# Patient Record
Sex: Male | Born: 1945 | Race: White | Hispanic: No | Marital: Married | Smoking: Former smoker
Health system: Southern US, Community
[De-identification: ages and names within clinical notes are randomized; demographics above are authoritative.]

## PROBLEM LIST (undated history)

## (undated) DIAGNOSIS — K589 Irritable bowel syndrome without diarrhea: Secondary | ICD-10-CM

## (undated) DIAGNOSIS — E785 Hyperlipidemia, unspecified: Secondary | ICD-10-CM

## (undated) DIAGNOSIS — I219 Acute myocardial infarction, unspecified: Secondary | ICD-10-CM

## (undated) DIAGNOSIS — Z95810 Presence of automatic (implantable) cardiac defibrillator: Secondary | ICD-10-CM

## (undated) DIAGNOSIS — I10 Essential (primary) hypertension: Secondary | ICD-10-CM

## (undated) DIAGNOSIS — K219 Gastro-esophageal reflux disease without esophagitis: Secondary | ICD-10-CM

## (undated) DIAGNOSIS — R0600 Dyspnea, unspecified: Secondary | ICD-10-CM

## (undated) DIAGNOSIS — I499 Cardiac arrhythmia, unspecified: Secondary | ICD-10-CM

## (undated) DIAGNOSIS — T4145XA Adverse effect of unspecified anesthetic, initial encounter: Secondary | ICD-10-CM

## (undated) DIAGNOSIS — E669 Obesity, unspecified: Secondary | ICD-10-CM

## (undated) DIAGNOSIS — E119 Type 2 diabetes mellitus without complications: Secondary | ICD-10-CM

## (undated) DIAGNOSIS — M545 Low back pain, unspecified: Secondary | ICD-10-CM

## (undated) DIAGNOSIS — G250 Essential tremor: Principal | ICD-10-CM

## (undated) DIAGNOSIS — E291 Testicular hypofunction: Secondary | ICD-10-CM

## (undated) DIAGNOSIS — I4891 Unspecified atrial fibrillation: Principal | ICD-10-CM

## (undated) DIAGNOSIS — Z9889 Other specified postprocedural states: Secondary | ICD-10-CM

## (undated) DIAGNOSIS — N200 Calculus of kidney: Secondary | ICD-10-CM

## (undated) DIAGNOSIS — G2581 Restless legs syndrome: Secondary | ICD-10-CM

## (undated) DIAGNOSIS — M1990 Unspecified osteoarthritis, unspecified site: Secondary | ICD-10-CM

## (undated) DIAGNOSIS — Z8709 Personal history of other diseases of the respiratory system: Secondary | ICD-10-CM

## (undated) DIAGNOSIS — I447 Left bundle-branch block, unspecified: Secondary | ICD-10-CM

## (undated) DIAGNOSIS — F419 Anxiety disorder, unspecified: Secondary | ICD-10-CM

## (undated) DIAGNOSIS — I2510 Atherosclerotic heart disease of native coronary artery without angina pectoris: Secondary | ICD-10-CM

## (undated) DIAGNOSIS — R112 Nausea with vomiting, unspecified: Secondary | ICD-10-CM

## (undated) DIAGNOSIS — G8929 Other chronic pain: Secondary | ICD-10-CM

## (undated) DIAGNOSIS — J61 Pneumoconiosis due to asbestos and other mineral fibers: Secondary | ICD-10-CM

## (undated) DIAGNOSIS — Z973 Presence of spectacles and contact lenses: Secondary | ICD-10-CM

## (undated) DIAGNOSIS — M12811 Other specific arthropathies, not elsewhere classified, right shoulder: Secondary | ICD-10-CM

## (undated) DIAGNOSIS — G4730 Sleep apnea, unspecified: Secondary | ICD-10-CM

## (undated) DIAGNOSIS — Z87442 Personal history of urinary calculi: Secondary | ICD-10-CM

## (undated) DIAGNOSIS — C6290 Malignant neoplasm of unspecified testis, unspecified whether descended or undescended: Secondary | ICD-10-CM

## (undated) DIAGNOSIS — I428 Other cardiomyopathies: Secondary | ICD-10-CM

## (undated) DIAGNOSIS — D6851 Activated protein C resistance: Secondary | ICD-10-CM

## (undated) DIAGNOSIS — M7500 Adhesive capsulitis of unspecified shoulder: Secondary | ICD-10-CM

## (undated) HISTORY — DX: Low back pain, unspecified: M54.50

## (undated) HISTORY — DX: Adhesive capsulitis of unspecified shoulder: M75.00

## (undated) HISTORY — DX: Activated protein C resistance: D68.51

## (undated) HISTORY — PX: KNEE ARTHROSCOPY: SUR90

## (undated) HISTORY — DX: Malignant neoplasm of unspecified testis, unspecified whether descended or undescended: C62.90

## (undated) HISTORY — PX: HERNIA REPAIR: SHX51

## (undated) HISTORY — DX: Low back pain: M54.5

## (undated) HISTORY — DX: Pneumoconiosis due to asbestos and other mineral fibers: J61

## (undated) HISTORY — DX: Gastro-esophageal reflux disease without esophagitis: K21.9

## (undated) HISTORY — PX: OTHER SURGICAL HISTORY: SHX169

## (undated) HISTORY — PX: TONSILLECTOMY: SUR1361

## (undated) HISTORY — DX: Other chronic pain: G89.29

## (undated) HISTORY — PX: APPENDECTOMY: SHX54

## (undated) HISTORY — DX: Testicular hypofunction: E29.1

## (undated) HISTORY — PX: RADIOFREQUENCY ABLATION NERVES: SUR1070

## (undated) HISTORY — DX: Unspecified osteoarthritis, unspecified site: M19.90

## (undated) HISTORY — PX: CHOLECYSTECTOMY OPEN: SUR202

## (undated) HISTORY — DX: Obesity, unspecified: E66.9

## (undated) HISTORY — PX: ROTATOR CUFF REPAIR: SHX139

## (undated) HISTORY — DX: Left bundle-branch block, unspecified: I44.7

## (undated) HISTORY — DX: Essential tremor: G25.0

## (undated) HISTORY — PX: SURGERY SCROTAL / TESTICULAR: SUR1316

---

## 1970-03-19 DIAGNOSIS — C6290 Malignant neoplasm of unspecified testis, unspecified whether descended or undescended: Secondary | ICD-10-CM

## 1970-03-19 HISTORY — DX: Malignant neoplasm of unspecified testis, unspecified whether descended or undescended: C62.90

## 1994-03-19 DIAGNOSIS — T8859XA Other complications of anesthesia, initial encounter: Secondary | ICD-10-CM

## 1994-03-19 HISTORY — DX: Other complications of anesthesia, initial encounter: T88.59XA

## 2009-07-17 HISTORY — PX: TRANSTHORACIC ECHOCARDIOGRAM: SHX275

## 2009-08-17 DIAGNOSIS — I428 Other cardiomyopathies: Secondary | ICD-10-CM

## 2009-08-17 DIAGNOSIS — I2510 Atherosclerotic heart disease of native coronary artery without angina pectoris: Secondary | ICD-10-CM

## 2009-08-17 HISTORY — DX: Atherosclerotic heart disease of native coronary artery without angina pectoris: I25.10

## 2009-08-17 HISTORY — DX: Other cardiomyopathies: I42.8

## 2009-08-17 HISTORY — PX: CARDIAC CATHETERIZATION: SHX172

## 2011-03-28 DIAGNOSIS — Z79899 Other long term (current) drug therapy: Secondary | ICD-10-CM | POA: Diagnosis not present

## 2011-03-28 DIAGNOSIS — M545 Low back pain: Secondary | ICD-10-CM | POA: Diagnosis not present

## 2011-03-28 DIAGNOSIS — I10 Essential (primary) hypertension: Secondary | ICD-10-CM | POA: Diagnosis not present

## 2011-03-28 DIAGNOSIS — N480 Leukoplakia of penis: Secondary | ICD-10-CM | POA: Diagnosis not present

## 2011-04-19 DIAGNOSIS — M47817 Spondylosis without myelopathy or radiculopathy, lumbosacral region: Secondary | ICD-10-CM | POA: Diagnosis not present

## 2011-04-19 DIAGNOSIS — M5137 Other intervertebral disc degeneration, lumbosacral region: Secondary | ICD-10-CM | POA: Diagnosis not present

## 2011-04-23 DIAGNOSIS — I10 Essential (primary) hypertension: Secondary | ICD-10-CM | POA: Diagnosis not present

## 2011-05-01 DIAGNOSIS — I10 Essential (primary) hypertension: Secondary | ICD-10-CM | POA: Diagnosis not present

## 2011-05-09 DIAGNOSIS — I10 Essential (primary) hypertension: Secondary | ICD-10-CM | POA: Diagnosis not present

## 2011-05-09 DIAGNOSIS — G8929 Other chronic pain: Secondary | ICD-10-CM | POA: Diagnosis not present

## 2011-05-09 DIAGNOSIS — E291 Testicular hypofunction: Secondary | ICD-10-CM | POA: Diagnosis not present

## 2011-05-09 DIAGNOSIS — K047 Periapical abscess without sinus: Secondary | ICD-10-CM | POA: Diagnosis not present

## 2011-05-10 DIAGNOSIS — M25559 Pain in unspecified hip: Secondary | ICD-10-CM | POA: Diagnosis not present

## 2011-06-11 DIAGNOSIS — M47817 Spondylosis without myelopathy or radiculopathy, lumbosacral region: Secondary | ICD-10-CM | POA: Diagnosis not present

## 2011-06-18 DIAGNOSIS — E119 Type 2 diabetes mellitus without complications: Secondary | ICD-10-CM

## 2011-06-18 HISTORY — DX: Type 2 diabetes mellitus without complications: E11.9

## 2011-07-10 DIAGNOSIS — I10 Essential (primary) hypertension: Secondary | ICD-10-CM | POA: Diagnosis not present

## 2011-07-10 DIAGNOSIS — E785 Hyperlipidemia, unspecified: Secondary | ICD-10-CM | POA: Diagnosis not present

## 2011-07-10 DIAGNOSIS — J019 Acute sinusitis, unspecified: Secondary | ICD-10-CM | POA: Diagnosis not present

## 2011-07-11 DIAGNOSIS — R7309 Other abnormal glucose: Secondary | ICD-10-CM | POA: Diagnosis not present

## 2011-08-23 DIAGNOSIS — E119 Type 2 diabetes mellitus without complications: Secondary | ICD-10-CM | POA: Diagnosis not present

## 2011-08-23 DIAGNOSIS — H25099 Other age-related incipient cataract, unspecified eye: Secondary | ICD-10-CM | POA: Diagnosis not present

## 2011-08-30 DIAGNOSIS — IMO0002 Reserved for concepts with insufficient information to code with codable children: Secondary | ICD-10-CM | POA: Diagnosis not present

## 2011-08-30 DIAGNOSIS — M25569 Pain in unspecified knee: Secondary | ICD-10-CM | POA: Diagnosis not present

## 2011-09-04 DIAGNOSIS — D235 Other benign neoplasm of skin of trunk: Secondary | ICD-10-CM | POA: Diagnosis not present

## 2011-09-04 DIAGNOSIS — D239 Other benign neoplasm of skin, unspecified: Secondary | ICD-10-CM | POA: Diagnosis not present

## 2011-10-05 DIAGNOSIS — G8929 Other chronic pain: Secondary | ICD-10-CM | POA: Diagnosis not present

## 2011-10-05 DIAGNOSIS — E119 Type 2 diabetes mellitus without complications: Secondary | ICD-10-CM | POA: Diagnosis not present

## 2011-10-05 DIAGNOSIS — G2589 Other specified extrapyramidal and movement disorders: Secondary | ICD-10-CM | POA: Diagnosis not present

## 2011-11-14 DIAGNOSIS — G8929 Other chronic pain: Secondary | ICD-10-CM | POA: Diagnosis not present

## 2011-11-14 DIAGNOSIS — E119 Type 2 diabetes mellitus without complications: Secondary | ICD-10-CM | POA: Diagnosis not present

## 2012-02-04 DIAGNOSIS — G8929 Other chronic pain: Secondary | ICD-10-CM | POA: Diagnosis not present

## 2012-02-04 DIAGNOSIS — I10 Essential (primary) hypertension: Secondary | ICD-10-CM | POA: Diagnosis not present

## 2012-02-04 DIAGNOSIS — E291 Testicular hypofunction: Secondary | ICD-10-CM | POA: Diagnosis not present

## 2012-02-04 DIAGNOSIS — E119 Type 2 diabetes mellitus without complications: Secondary | ICD-10-CM | POA: Diagnosis not present

## 2012-02-04 DIAGNOSIS — E785 Hyperlipidemia, unspecified: Secondary | ICD-10-CM | POA: Diagnosis not present

## 2012-02-11 DIAGNOSIS — Z23 Encounter for immunization: Secondary | ICD-10-CM | POA: Diagnosis not present

## 2012-02-20 DIAGNOSIS — M1990 Unspecified osteoarthritis, unspecified site: Secondary | ICD-10-CM | POA: Diagnosis not present

## 2012-02-20 DIAGNOSIS — Z79899 Other long term (current) drug therapy: Secondary | ICD-10-CM | POA: Diagnosis not present

## 2012-02-20 DIAGNOSIS — G894 Chronic pain syndrome: Secondary | ICD-10-CM | POA: Diagnosis not present

## 2012-02-20 DIAGNOSIS — M47817 Spondylosis without myelopathy or radiculopathy, lumbosacral region: Secondary | ICD-10-CM | POA: Diagnosis not present

## 2012-02-20 DIAGNOSIS — M5137 Other intervertebral disc degeneration, lumbosacral region: Secondary | ICD-10-CM | POA: Diagnosis not present

## 2012-02-27 DIAGNOSIS — M19019 Primary osteoarthritis, unspecified shoulder: Secondary | ICD-10-CM | POA: Diagnosis not present

## 2012-03-03 DIAGNOSIS — M25559 Pain in unspecified hip: Secondary | ICD-10-CM | POA: Diagnosis not present

## 2012-03-05 DIAGNOSIS — M25559 Pain in unspecified hip: Secondary | ICD-10-CM | POA: Diagnosis not present

## 2012-03-20 DIAGNOSIS — IMO0001 Reserved for inherently not codable concepts without codable children: Secondary | ICD-10-CM | POA: Diagnosis not present

## 2012-03-20 DIAGNOSIS — G894 Chronic pain syndrome: Secondary | ICD-10-CM | POA: Diagnosis not present

## 2012-03-20 DIAGNOSIS — G5790 Unspecified mononeuropathy of unspecified lower limb: Secondary | ICD-10-CM | POA: Diagnosis not present

## 2012-03-20 DIAGNOSIS — M1990 Unspecified osteoarthritis, unspecified site: Secondary | ICD-10-CM | POA: Diagnosis not present

## 2012-03-27 DIAGNOSIS — IMO0002 Reserved for concepts with insufficient information to code with codable children: Secondary | ICD-10-CM | POA: Diagnosis not present

## 2012-03-27 DIAGNOSIS — R209 Unspecified disturbances of skin sensation: Secondary | ICD-10-CM | POA: Diagnosis not present

## 2012-05-05 DIAGNOSIS — Z79899 Other long term (current) drug therapy: Secondary | ICD-10-CM | POA: Diagnosis not present

## 2012-05-05 DIAGNOSIS — G894 Chronic pain syndrome: Secondary | ICD-10-CM | POA: Diagnosis not present

## 2012-05-05 DIAGNOSIS — M169 Osteoarthritis of hip, unspecified: Secondary | ICD-10-CM | POA: Diagnosis not present

## 2012-05-05 DIAGNOSIS — M76899 Other specified enthesopathies of unspecified lower limb, excluding foot: Secondary | ICD-10-CM | POA: Diagnosis not present

## 2012-05-07 DIAGNOSIS — G4700 Insomnia, unspecified: Secondary | ICD-10-CM | POA: Diagnosis not present

## 2012-05-07 DIAGNOSIS — E119 Type 2 diabetes mellitus without complications: Secondary | ICD-10-CM | POA: Diagnosis not present

## 2012-05-14 DIAGNOSIS — M76899 Other specified enthesopathies of unspecified lower limb, excluding foot: Secondary | ICD-10-CM | POA: Diagnosis not present

## 2012-05-15 ENCOUNTER — Other Ambulatory Visit: Payer: Self-pay | Admitting: Pain Medicine

## 2012-05-24 ENCOUNTER — Other Ambulatory Visit: Payer: Self-pay

## 2012-06-02 ENCOUNTER — Other Ambulatory Visit: Payer: Self-pay

## 2012-06-02 DIAGNOSIS — M47817 Spondylosis without myelopathy or radiculopathy, lumbosacral region: Secondary | ICD-10-CM | POA: Diagnosis not present

## 2012-06-07 ENCOUNTER — Ambulatory Visit
Admission: RE | Admit: 2012-06-07 | Discharge: 2012-06-07 | Disposition: A | Discharge status: Home / Self Care | Payer: Medicare Other | Source: Ambulatory Visit | Attending: Pain Medicine | Admitting: Pain Medicine

## 2012-06-07 DIAGNOSIS — M47817 Spondylosis without myelopathy or radiculopathy, lumbosacral region: Secondary | ICD-10-CM | POA: Diagnosis not present

## 2012-06-07 DIAGNOSIS — M545 Low back pain: Secondary | ICD-10-CM

## 2012-06-13 ENCOUNTER — Ambulatory Visit (HOSPITAL_COMMUNITY)
Admission: RE | Admit: 2012-06-13 | Discharge: 2012-06-13 | Disposition: A | Discharge status: Home / Self Care | Payer: Medicare Other | Source: Ambulatory Visit | Attending: Cardiology | Admitting: Cardiology

## 2012-07-16 DIAGNOSIS — M67919 Unspecified disorder of synovium and tendon, unspecified shoulder: Secondary | ICD-10-CM | POA: Diagnosis not present

## 2012-08-04 DIAGNOSIS — N529 Male erectile dysfunction, unspecified: Secondary | ICD-10-CM | POA: Diagnosis not present

## 2012-08-04 DIAGNOSIS — I10 Essential (primary) hypertension: Secondary | ICD-10-CM | POA: Diagnosis not present

## 2012-08-04 DIAGNOSIS — E119 Type 2 diabetes mellitus without complications: Secondary | ICD-10-CM | POA: Diagnosis not present

## 2012-08-06 DIAGNOSIS — G894 Chronic pain syndrome: Secondary | ICD-10-CM | POA: Diagnosis not present

## 2012-08-06 DIAGNOSIS — Z79899 Other long term (current) drug therapy: Secondary | ICD-10-CM | POA: Diagnosis not present

## 2012-08-06 DIAGNOSIS — M353 Polymyalgia rheumatica: Secondary | ICD-10-CM | POA: Diagnosis not present

## 2012-08-06 DIAGNOSIS — M5137 Other intervertebral disc degeneration, lumbosacral region: Secondary | ICD-10-CM | POA: Diagnosis not present

## 2012-08-12 DIAGNOSIS — M25559 Pain in unspecified hip: Secondary | ICD-10-CM | POA: Diagnosis not present

## 2012-08-12 DIAGNOSIS — M5380 Other specified dorsopathies, site unspecified: Secondary | ICD-10-CM | POA: Diagnosis not present

## 2012-08-13 DIAGNOSIS — M25559 Pain in unspecified hip: Secondary | ICD-10-CM | POA: Diagnosis not present

## 2012-08-14 DIAGNOSIS — M47817 Spondylosis without myelopathy or radiculopathy, lumbosacral region: Secondary | ICD-10-CM | POA: Diagnosis not present

## 2012-08-26 DIAGNOSIS — M2550 Pain in unspecified joint: Secondary | ICD-10-CM | POA: Diagnosis not present

## 2012-08-26 DIAGNOSIS — G894 Chronic pain syndrome: Secondary | ICD-10-CM | POA: Diagnosis not present

## 2012-08-26 DIAGNOSIS — IMO0001 Reserved for inherently not codable concepts without codable children: Secondary | ICD-10-CM | POA: Diagnosis not present

## 2012-08-26 DIAGNOSIS — Z79899 Other long term (current) drug therapy: Secondary | ICD-10-CM | POA: Diagnosis not present

## 2012-08-26 DIAGNOSIS — M47817 Spondylosis without myelopathy or radiculopathy, lumbosacral region: Secondary | ICD-10-CM | POA: Diagnosis not present

## 2012-11-03 DIAGNOSIS — E119 Type 2 diabetes mellitus without complications: Secondary | ICD-10-CM | POA: Diagnosis not present

## 2012-11-03 DIAGNOSIS — I10 Essential (primary) hypertension: Secondary | ICD-10-CM | POA: Diagnosis not present

## 2012-11-03 DIAGNOSIS — E785 Hyperlipidemia, unspecified: Secondary | ICD-10-CM | POA: Diagnosis not present

## 2012-11-04 DIAGNOSIS — M5137 Other intervertebral disc degeneration, lumbosacral region: Secondary | ICD-10-CM | POA: Diagnosis not present

## 2012-11-04 DIAGNOSIS — Z79899 Other long term (current) drug therapy: Secondary | ICD-10-CM | POA: Diagnosis not present

## 2012-11-04 DIAGNOSIS — IMO0001 Reserved for inherently not codable concepts without codable children: Secondary | ICD-10-CM | POA: Diagnosis not present

## 2012-11-04 DIAGNOSIS — G894 Chronic pain syndrome: Secondary | ICD-10-CM | POA: Diagnosis not present

## 2012-11-19 DIAGNOSIS — G894 Chronic pain syndrome: Secondary | ICD-10-CM | POA: Diagnosis not present

## 2012-11-19 DIAGNOSIS — M5380 Other specified dorsopathies, site unspecified: Secondary | ICD-10-CM | POA: Diagnosis not present

## 2012-11-19 DIAGNOSIS — Z79899 Other long term (current) drug therapy: Secondary | ICD-10-CM | POA: Diagnosis not present

## 2012-11-19 DIAGNOSIS — M5137 Other intervertebral disc degeneration, lumbosacral region: Secondary | ICD-10-CM | POA: Diagnosis not present

## 2012-11-20 DIAGNOSIS — M461 Sacroiliitis, not elsewhere classified: Secondary | ICD-10-CM | POA: Diagnosis not present

## 2012-12-18 DIAGNOSIS — M461 Sacroiliitis, not elsewhere classified: Secondary | ICD-10-CM | POA: Diagnosis not present

## 2012-12-24 DIAGNOSIS — M2550 Pain in unspecified joint: Secondary | ICD-10-CM | POA: Diagnosis not present

## 2012-12-24 DIAGNOSIS — G894 Chronic pain syndrome: Secondary | ICD-10-CM | POA: Diagnosis not present

## 2012-12-24 DIAGNOSIS — M5137 Other intervertebral disc degeneration, lumbosacral region: Secondary | ICD-10-CM | POA: Diagnosis not present

## 2012-12-24 DIAGNOSIS — IMO0001 Reserved for inherently not codable concepts without codable children: Secondary | ICD-10-CM | POA: Diagnosis not present

## 2012-12-24 DIAGNOSIS — Z79899 Other long term (current) drug therapy: Secondary | ICD-10-CM | POA: Diagnosis not present

## 2013-01-07 DIAGNOSIS — Z23 Encounter for immunization: Secondary | ICD-10-CM | POA: Diagnosis not present

## 2013-01-15 DIAGNOSIS — M25559 Pain in unspecified hip: Secondary | ICD-10-CM | POA: Diagnosis not present

## 2013-01-28 DIAGNOSIS — M5380 Other specified dorsopathies, site unspecified: Secondary | ICD-10-CM | POA: Diagnosis not present

## 2013-01-28 DIAGNOSIS — M353 Polymyalgia rheumatica: Secondary | ICD-10-CM | POA: Diagnosis not present

## 2013-01-28 DIAGNOSIS — IMO0001 Reserved for inherently not codable concepts without codable children: Secondary | ICD-10-CM | POA: Diagnosis not present

## 2013-01-28 DIAGNOSIS — M47817 Spondylosis without myelopathy or radiculopathy, lumbosacral region: Secondary | ICD-10-CM | POA: Diagnosis not present

## 2013-01-28 DIAGNOSIS — Z79899 Other long term (current) drug therapy: Secondary | ICD-10-CM | POA: Diagnosis not present

## 2013-01-28 DIAGNOSIS — M5137 Other intervertebral disc degeneration, lumbosacral region: Secondary | ICD-10-CM | POA: Diagnosis not present

## 2013-01-28 DIAGNOSIS — G894 Chronic pain syndrome: Secondary | ICD-10-CM | POA: Diagnosis not present

## 2013-02-05 DIAGNOSIS — E291 Testicular hypofunction: Secondary | ICD-10-CM | POA: Diagnosis not present

## 2013-02-05 DIAGNOSIS — Z1211 Encounter for screening for malignant neoplasm of colon: Secondary | ICD-10-CM | POA: Diagnosis not present

## 2013-02-05 DIAGNOSIS — J61 Pneumoconiosis due to asbestos and other mineral fibers: Secondary | ICD-10-CM | POA: Diagnosis not present

## 2013-02-05 DIAGNOSIS — Z136 Encounter for screening for cardiovascular disorders: Secondary | ICD-10-CM | POA: Diagnosis not present

## 2013-02-05 DIAGNOSIS — Z125 Encounter for screening for malignant neoplasm of prostate: Secondary | ICD-10-CM | POA: Diagnosis not present

## 2013-02-05 DIAGNOSIS — E119 Type 2 diabetes mellitus without complications: Secondary | ICD-10-CM | POA: Diagnosis not present

## 2013-02-05 DIAGNOSIS — I10 Essential (primary) hypertension: Secondary | ICD-10-CM | POA: Diagnosis not present

## 2013-02-05 DIAGNOSIS — E785 Hyperlipidemia, unspecified: Secondary | ICD-10-CM | POA: Diagnosis not present

## 2013-02-05 DIAGNOSIS — Z0000 Encounter for general adult medical examination without abnormal findings: Secondary | ICD-10-CM | POA: Diagnosis not present

## 2013-02-06 ENCOUNTER — Other Ambulatory Visit: Payer: Self-pay | Admitting: Family Medicine

## 2013-02-06 DIAGNOSIS — Z136 Encounter for screening for cardiovascular disorders: Secondary | ICD-10-CM

## 2013-02-11 ENCOUNTER — Ambulatory Visit
Admission: RE | Admit: 2013-02-11 | Discharge: 2013-02-11 | Disposition: A | Discharge status: Home / Self Care | Payer: Medicare Other | Source: Ambulatory Visit | Attending: Family Medicine | Admitting: Family Medicine

## 2013-02-11 ENCOUNTER — Other Ambulatory Visit: Payer: Self-pay | Admitting: Family Medicine

## 2013-02-11 DIAGNOSIS — Z136 Encounter for screening for cardiovascular disorders: Secondary | ICD-10-CM

## 2013-02-11 DIAGNOSIS — R918 Other nonspecific abnormal finding of lung field: Secondary | ICD-10-CM | POA: Diagnosis not present

## 2013-02-16 ENCOUNTER — Other Ambulatory Visit: Payer: Self-pay | Admitting: Family Medicine

## 2013-02-16 DIAGNOSIS — J61 Pneumoconiosis due to asbestos and other mineral fibers: Secondary | ICD-10-CM

## 2013-02-18 ENCOUNTER — Ambulatory Visit
Admission: RE | Admit: 2013-02-18 | Discharge: 2013-02-18 | Disposition: A | Discharge status: Home / Self Care | Payer: Medicare Other | Source: Ambulatory Visit | Attending: Family Medicine | Admitting: Family Medicine

## 2013-02-18 DIAGNOSIS — J61 Pneumoconiosis due to asbestos and other mineral fibers: Secondary | ICD-10-CM

## 2013-02-18 MED ORDER — IOHEXOL 300 MG/ML  SOLN: 75.0000 mL | Freq: Once | INTRAMUSCULAR | Status: AC | PRN

## 2013-02-26 DIAGNOSIS — M47817 Spondylosis without myelopathy or radiculopathy, lumbosacral region: Secondary | ICD-10-CM | POA: Diagnosis not present

## 2013-03-10 DIAGNOSIS — M47817 Spondylosis without myelopathy or radiculopathy, lumbosacral region: Secondary | ICD-10-CM | POA: Diagnosis not present

## 2013-03-30 DIAGNOSIS — E291 Testicular hypofunction: Secondary | ICD-10-CM | POA: Diagnosis not present

## 2013-03-30 DIAGNOSIS — R972 Elevated prostate specific antigen [PSA]: Secondary | ICD-10-CM | POA: Diagnosis not present

## 2013-03-30 DIAGNOSIS — N529 Male erectile dysfunction, unspecified: Secondary | ICD-10-CM | POA: Diagnosis not present

## 2013-03-31 DIAGNOSIS — M5137 Other intervertebral disc degeneration, lumbosacral region: Secondary | ICD-10-CM | POA: Diagnosis not present

## 2013-03-31 DIAGNOSIS — M461 Sacroiliitis, not elsewhere classified: Secondary | ICD-10-CM | POA: Diagnosis not present

## 2013-03-31 DIAGNOSIS — G894 Chronic pain syndrome: Secondary | ICD-10-CM | POA: Diagnosis not present

## 2013-03-31 DIAGNOSIS — Z79899 Other long term (current) drug therapy: Secondary | ICD-10-CM | POA: Diagnosis not present

## 2013-04-16 ENCOUNTER — Ambulatory Visit
Admission: RE | Admit: 2013-04-16 | Discharge: 2013-04-16 | Disposition: A | Discharge status: Home / Self Care | Payer: Medicare Other | Source: Ambulatory Visit | Attending: Family Medicine | Admitting: Family Medicine

## 2013-04-16 DIAGNOSIS — J8410 Pulmonary fibrosis, unspecified: Secondary | ICD-10-CM | POA: Diagnosis not present

## 2013-04-16 MED ORDER — IOHEXOL 300 MG/ML  SOLN
75.0000 mL | Freq: Once | INTRAMUSCULAR | Status: AC | PRN
  Administered 2013-04-16: 75 mL via INTRAVENOUS

## 2013-04-17 DIAGNOSIS — E119 Type 2 diabetes mellitus without complications: Secondary | ICD-10-CM | POA: Diagnosis not present

## 2013-04-17 DIAGNOSIS — H18419 Arcus senilis, unspecified eye: Secondary | ICD-10-CM | POA: Diagnosis not present

## 2013-04-20 DIAGNOSIS — D126 Benign neoplasm of colon, unspecified: Secondary | ICD-10-CM | POA: Diagnosis not present

## 2013-04-20 DIAGNOSIS — K5730 Diverticulosis of large intestine without perforation or abscess without bleeding: Secondary | ICD-10-CM | POA: Diagnosis not present

## 2013-04-20 DIAGNOSIS — Z09 Encounter for follow-up examination after completed treatment for conditions other than malignant neoplasm: Secondary | ICD-10-CM | POA: Diagnosis not present

## 2013-04-20 DIAGNOSIS — Z86010 Personal history of colonic polyps: Secondary | ICD-10-CM | POA: Diagnosis not present

## 2013-04-21 ENCOUNTER — Other Ambulatory Visit: Payer: Self-pay | Admitting: Family Medicine

## 2013-04-21 DIAGNOSIS — E279 Disorder of adrenal gland, unspecified: Principal | ICD-10-CM

## 2013-04-21 DIAGNOSIS — E278 Other specified disorders of adrenal gland: Secondary | ICD-10-CM

## 2013-04-29 DIAGNOSIS — M47817 Spondylosis without myelopathy or radiculopathy, lumbosacral region: Secondary | ICD-10-CM | POA: Diagnosis not present

## 2013-04-29 DIAGNOSIS — G894 Chronic pain syndrome: Secondary | ICD-10-CM | POA: Diagnosis not present

## 2013-04-29 DIAGNOSIS — Z79899 Other long term (current) drug therapy: Secondary | ICD-10-CM | POA: Diagnosis not present

## 2013-04-29 DIAGNOSIS — M779 Enthesopathy, unspecified: Secondary | ICD-10-CM | POA: Diagnosis not present

## 2013-04-30 DIAGNOSIS — R002 Palpitations: Secondary | ICD-10-CM | POA: Diagnosis not present

## 2013-04-30 DIAGNOSIS — E119 Type 2 diabetes mellitus without complications: Secondary | ICD-10-CM | POA: Diagnosis not present

## 2013-04-30 DIAGNOSIS — G4700 Insomnia, unspecified: Secondary | ICD-10-CM | POA: Diagnosis not present

## 2013-04-30 DIAGNOSIS — G2589 Other specified extrapyramidal and movement disorders: Secondary | ICD-10-CM | POA: Diagnosis not present

## 2013-04-30 DIAGNOSIS — E279 Disorder of adrenal gland, unspecified: Secondary | ICD-10-CM | POA: Diagnosis not present

## 2013-04-30 DIAGNOSIS — Z8679 Personal history of other diseases of the circulatory system: Secondary | ICD-10-CM | POA: Diagnosis not present

## 2013-04-30 DIAGNOSIS — I10 Essential (primary) hypertension: Secondary | ICD-10-CM | POA: Diagnosis not present

## 2013-04-30 DIAGNOSIS — Z79899 Other long term (current) drug therapy: Secondary | ICD-10-CM | POA: Diagnosis not present

## 2013-04-30 DIAGNOSIS — E291 Testicular hypofunction: Secondary | ICD-10-CM | POA: Diagnosis not present

## 2013-05-07 ENCOUNTER — Other Ambulatory Visit: Payer: Medicare Other

## 2013-05-12 ENCOUNTER — Encounter: Payer: Self-pay | Admitting: General Surgery

## 2013-05-12 DIAGNOSIS — Z8679 Personal history of other diseases of the circulatory system: Secondary | ICD-10-CM

## 2013-05-12 DIAGNOSIS — I2510 Atherosclerotic heart disease of native coronary artery without angina pectoris: Secondary | ICD-10-CM | POA: Insufficient documentation

## 2013-05-12 DIAGNOSIS — I10 Essential (primary) hypertension: Secondary | ICD-10-CM | POA: Insufficient documentation

## 2013-05-12 DIAGNOSIS — G2581 Restless legs syndrome: Secondary | ICD-10-CM | POA: Insufficient documentation

## 2013-05-12 DIAGNOSIS — R002 Palpitations: Secondary | ICD-10-CM

## 2013-05-12 DIAGNOSIS — Z79899 Other long term (current) drug therapy: Secondary | ICD-10-CM | POA: Insufficient documentation

## 2013-05-12 DIAGNOSIS — G4700 Insomnia, unspecified: Secondary | ICD-10-CM

## 2013-05-13 ENCOUNTER — Ambulatory Visit: Payer: Medicare Other | Admitting: Cardiology

## 2013-05-14 ENCOUNTER — Other Ambulatory Visit: Payer: Medicare Other

## 2013-05-17 HISTORY — PX: TRANSTHORACIC ECHOCARDIOGRAM: SHX275

## 2013-05-18 ENCOUNTER — Encounter: Payer: Self-pay | Admitting: *Deleted

## 2013-05-18 ENCOUNTER — Encounter: Payer: Self-pay | Admitting: Cardiology

## 2013-05-18 ENCOUNTER — Ambulatory Visit (INDEPENDENT_AMBULATORY_CARE_PROVIDER_SITE_OTHER): Payer: Medicare Other | Admitting: Cardiology

## 2013-05-18 VITALS — BP 138/80 | HR 84 | Ht 72.0 in | Wt 253.0 lb

## 2013-05-18 DIAGNOSIS — R002 Palpitations: Secondary | ICD-10-CM | POA: Diagnosis not present

## 2013-05-18 DIAGNOSIS — I447 Left bundle-branch block, unspecified: Secondary | ICD-10-CM | POA: Diagnosis not present

## 2013-05-18 DIAGNOSIS — Z8679 Personal history of other diseases of the circulatory system: Secondary | ICD-10-CM

## 2013-05-18 DIAGNOSIS — I10 Essential (primary) hypertension: Secondary | ICD-10-CM | POA: Diagnosis not present

## 2013-05-18 DIAGNOSIS — I428 Other cardiomyopathies: Secondary | ICD-10-CM

## 2013-05-18 NOTE — Progress Notes (Signed)
Patient ID: Walter [name], male   DOB: 03-15-1946, 68 y.o.   MRN: [id number] PCP: Dr. Orland Mustard  68 yo with history of chronic LBBB and mild cardiomyopathy presents for cardiology evaluation.  He was found to have LBBB 15-20 years ago.  In 2011, he had a stress test in [facility] that was abnormal so he was taken for [facility] in 6/11 that showed nonobstructive disease.  He had an echo in 5/11 that showed EF 45-50%, diffuse hypokinesis suggesting a mild nonischemic cardiomyopathy.  He has not had cardiac evaluation since that time.    Recently, patient has been symptomatically stable.  ECG continues to show LBBB.  He has had no chest pain.  He has mild chronic dyspnea that he attributes to asbestosis.  He is short of breath if he rushes up steps or walks up a steep hill.  No orthopnea or bendopnea.  He is currently on lovastatin, which he tolerates.  He had myalgias with Zocor and Lipitor.  He has not been as active as usual this winter and his weight is up.  BP is ok and blood glucose has been controlled.   ECG: NSR, LBBB, PVC  Labs (11/14): K 3.9, creatinine 1.14, LDL 104, LFTs normal  Labs (2/15): HCT 56.3  PMH: 1. Type II diabetes 2. HTN 3. Adrenal nodule 4. Restless leg syndrome 5. OA with left hip pain, right frozen shoulder, low back pain.  6. Testicular cancer: 1972 7. Chronic LBBB: Had Cardiolite in 2011 that was abnormal so had LHC in 6/11 in [place], [place].  This showed 30% LCx stenosis and 40% ostial RCA stenosis.   8. Cardiomyopathy: Mild nonischemic cardiomyopathy, ?LBBB cardiomyopathy.  Echo (5/11) with EF 45-50%, moderate LVH.  9. Factor V Leiden + 10. Asbestosis: Mild.  Exposure while in the [employer].  11. GERD 12. PVCs 13. Polycythemia: Uncertain etiology 14. Hyperlipidemia: Myalgias with Zocor and Lipitor.   SH: Retired from [employer], lives with wife in [place].  2 daughters.  Occasional ETOH.  Quit smoking > 40 years ago.   FH: No premature CAD  ROS: All systems  reviewed and negative except as per HPI.    Current Outpatient Prescriptions  Medication Sig Dispense Refill  . albuterol (PROVENTIL HFA;VENTOLIN HFA) 108 (90 BASE) MCG/ACT inhaler Inhale 2 puffs into the lungs as needed for wheezing or shortness of breath.      Marland Kitchen aspirin 81 MG tablet Take 81 mg by mouth daily.      . celecoxib (CELEBREX) 200 MG capsule Take 200 mg by mouth daily.      . Cholecalciferol (VITAMIN D-3) 1000 UNITS CAPS Take 1 capsule by mouth daily.      Marland Kitchen esomeprazole (NEXIUM) 40 MG capsule Take 40 mg by mouth daily at 12 noon.      . hydrochlorothiazide (HYDRODIURIL) 25 MG tablet Take 25 mg by mouth daily.      Marland Kitchen HYDROcodone-acetaminophen (LORCET) 10-650 MG per tablet Take 1 tablet by mouth as needed for pain (Twice a day as needed for pain).      Marland Kitchen lisinopril (PRINIVIL,ZESTRIL) 40 MG tablet Take 40 mg by mouth daily.      Marland Kitchen LORazepam (ATIVAN) 1 MG tablet Take 1 mg by mouth. As needed 30 mins prior to procedure      . lovastatin (ALTOPREV) 60 MG 24 hr tablet Take 60 mg by mouth at bedtime.      . magnesium oxide (MAG-OX) 400 MG tablet Take 400 mg by mouth  daily.      . metFORMIN (GLUCOPHAGE) 1000 MG tablet Take 1,000 mg by mouth 2 (two) times daily with a meal.      . omega-3 acid ethyl esters (LOVAZA) 1 G capsule Take 1 g by mouth 2 (two) times daily.      Marland Kitchen rOPINIRole (REQUIP) 0.5 MG tablet Take 0.5 mg by mouth daily as needed.      Marland Kitchen rOPINIRole (REQUIP) 1 MG tablet Take 1 mg by mouth as needed.      . sildenafil (VIAGRA) 100 MG tablet Take 100 mg by mouth daily as needed for erectile dysfunction.      . temazepam (RESTORIL) 15 MG capsule Take 15 mg by mouth at bedtime as needed for sleep.      Marland Kitchen testosterone cypionate (DEPO-TESTOSTERONE) 200 MG/ML injection Inject into the muscle every 7 (seven) days.      Marland Kitchen tiZANidine (ZANAFLEX) 4 MG capsule Take 4 mg by mouth as needed for muscle spasms.       No current facility-administered medications for this visit.   BP 138/80   Pulse 84  Ht 6' (1.829 m)  Wt 114.76 kg (253 lb)  BMI 34.31 kg/m2  SpO2 99% General: NAD, overweight Neck: No JVD, no thyromegaly or thyroid nodule.  Lungs: Clear to auscultation bilaterally with normal respiratory effort. CV: Nondisplaced PMI.  Heart regular S1/S2, no S3/S4, no murmur.  No peripheral edema.  No carotid bruit.  Normal pedal pulses.  Abdomen: Soft, nontender, no hepatosplenomegaly, no distention.  Skin: Intact without lesions or rashes.  Neurologic: Alert and oriented x 3.  Psych: Normal affect. Extremities: No clubbing or cyanosis.  HEENT: Normal.   Assessment/Plan: 1. Cardiomyopathy: Mild nonischemic cardiomyopathy based on echo and LHC in 2011.  The cardiomyopathy may be secondary to LBBB (versus HTN).  BP is controlled.  - Continue lisinopril. - Will repeat echo.  If EF is still decreased, would consider starting him on Coreg.  2. CAD: Nonobstructive on LHC in 2011.  No ischemic symptoms.  With CAD and diabetes, he needs to be on statin and ASA 81.  3. Hyperlipidemia: Goal LDL < 70.  He is on the maximum dose of his lovastatin and has not tolerated other statins.  Although LDL is a bit above goal, I would have him continue the current lovastatin for now.  4. HTN: BP is controlled on current regimen.   Loralie Champagne 05/18/2013 5:05 PM

## 2013-05-18 NOTE — Patient Instructions (Signed)
Your physician has requested that you have an echocardiogram. Echocardiography is a painless test that uses sound waves to create images of your heart. It provides your doctor with information about the size and shape of your heart and how well your heart's chambers and valves are working. This procedure takes approximately one hour. There are no restrictions for this procedure.  Your physician wants you to follow-up in: 1 year with Dr McLean. (March 2016). You will receive a reminder letter in the mail two months in advance. If you don't receive a letter, please call our office to schedule the follow-up appointment.   

## 2013-05-22 ENCOUNTER — Ambulatory Visit
Admission: RE | Admit: 2013-05-22 | Discharge: 2013-05-22 | Disposition: A | Discharge status: Home / Self Care | Payer: Medicare Other | Source: Ambulatory Visit | Attending: Family Medicine | Admitting: Family Medicine

## 2013-05-22 DIAGNOSIS — E278 Other specified disorders of adrenal gland: Secondary | ICD-10-CM

## 2013-05-22 DIAGNOSIS — E279 Disorder of adrenal gland, unspecified: Principal | ICD-10-CM

## 2013-06-03 ENCOUNTER — Encounter: Payer: Self-pay | Admitting: Cardiovascular Disease

## 2013-06-03 ENCOUNTER — Ambulatory Visit (HOSPITAL_COMMUNITY): Payer: Medicare Other | Attending: Cardiovascular Disease | Admitting: Cardiology

## 2013-06-03 DIAGNOSIS — E785 Hyperlipidemia, unspecified: Secondary | ICD-10-CM | POA: Insufficient documentation

## 2013-06-03 DIAGNOSIS — I2510 Atherosclerotic heart disease of native coronary artery without angina pectoris: Secondary | ICD-10-CM | POA: Insufficient documentation

## 2013-06-03 DIAGNOSIS — I10 Essential (primary) hypertension: Secondary | ICD-10-CM | POA: Insufficient documentation

## 2013-06-03 DIAGNOSIS — Z87891 Personal history of nicotine dependence: Secondary | ICD-10-CM | POA: Insufficient documentation

## 2013-06-03 DIAGNOSIS — E119 Type 2 diabetes mellitus without complications: Secondary | ICD-10-CM | POA: Insufficient documentation

## 2013-06-03 DIAGNOSIS — I447 Left bundle-branch block, unspecified: Secondary | ICD-10-CM | POA: Insufficient documentation

## 2013-06-03 DIAGNOSIS — I428 Other cardiomyopathies: Secondary | ICD-10-CM | POA: Insufficient documentation

## 2013-06-03 NOTE — Progress Notes (Signed)
Echo performed. 

## 2013-06-04 ENCOUNTER — Ambulatory Visit
Admission: RE | Admit: 2013-06-04 | Discharge: 2013-06-04 | Disposition: A | Discharge status: Home / Self Care | Payer: Medicare Other | Source: Ambulatory Visit | Attending: Family Medicine | Admitting: Family Medicine

## 2013-06-04 DIAGNOSIS — D3500 Benign neoplasm of unspecified adrenal gland: Secondary | ICD-10-CM | POA: Diagnosis not present

## 2013-06-04 MED ORDER — GADOBENATE DIMEGLUMINE 529 MG/ML IV SOLN
20.0000 mL | Freq: Once | INTRAVENOUS | Status: AC | PRN
  Administered 2013-06-04: 20 mL via INTRAVENOUS

## 2013-07-08 DIAGNOSIS — M47817 Spondylosis without myelopathy or radiculopathy, lumbosacral region: Secondary | ICD-10-CM | POA: Diagnosis not present

## 2013-07-08 DIAGNOSIS — M5137 Other intervertebral disc degeneration, lumbosacral region: Secondary | ICD-10-CM | POA: Diagnosis not present

## 2013-07-08 DIAGNOSIS — M19019 Primary osteoarthritis, unspecified shoulder: Secondary | ICD-10-CM | POA: Diagnosis not present

## 2013-07-08 DIAGNOSIS — G894 Chronic pain syndrome: Secondary | ICD-10-CM | POA: Diagnosis not present

## 2013-07-08 DIAGNOSIS — IMO0001 Reserved for inherently not codable concepts without codable children: Secondary | ICD-10-CM | POA: Diagnosis not present

## 2013-07-08 DIAGNOSIS — Z79899 Other long term (current) drug therapy: Secondary | ICD-10-CM | POA: Diagnosis not present

## 2013-07-27 DIAGNOSIS — G2589 Other specified extrapyramidal and movement disorders: Secondary | ICD-10-CM | POA: Diagnosis not present

## 2013-07-27 DIAGNOSIS — E119 Type 2 diabetes mellitus without complications: Secondary | ICD-10-CM | POA: Diagnosis not present

## 2013-07-27 DIAGNOSIS — I10 Essential (primary) hypertension: Secondary | ICD-10-CM | POA: Diagnosis not present

## 2013-07-27 DIAGNOSIS — E785 Hyperlipidemia, unspecified: Secondary | ICD-10-CM | POA: Diagnosis not present

## 2013-07-27 DIAGNOSIS — G4700 Insomnia, unspecified: Secondary | ICD-10-CM | POA: Diagnosis not present

## 2013-07-27 DIAGNOSIS — E291 Testicular hypofunction: Secondary | ICD-10-CM | POA: Diagnosis not present

## 2013-08-12 DIAGNOSIS — M25559 Pain in unspecified hip: Secondary | ICD-10-CM | POA: Diagnosis not present

## 2013-08-27 DIAGNOSIS — M47817 Spondylosis without myelopathy or radiculopathy, lumbosacral region: Secondary | ICD-10-CM | POA: Diagnosis not present

## 2013-09-10 DIAGNOSIS — M47817 Spondylosis without myelopathy or radiculopathy, lumbosacral region: Secondary | ICD-10-CM | POA: Diagnosis not present

## 2013-09-16 DIAGNOSIS — M19019 Primary osteoarthritis, unspecified shoulder: Secondary | ICD-10-CM | POA: Diagnosis not present

## 2013-09-16 DIAGNOSIS — G894 Chronic pain syndrome: Secondary | ICD-10-CM | POA: Diagnosis not present

## 2013-09-16 DIAGNOSIS — Z79899 Other long term (current) drug therapy: Secondary | ICD-10-CM | POA: Diagnosis not present

## 2013-09-16 DIAGNOSIS — M47817 Spondylosis without myelopathy or radiculopathy, lumbosacral region: Secondary | ICD-10-CM | POA: Diagnosis not present

## 2013-10-04 DIAGNOSIS — R300 Dysuria: Secondary | ICD-10-CM | POA: Diagnosis not present

## 2013-10-04 DIAGNOSIS — I2510 Atherosclerotic heart disease of native coronary artery without angina pectoris: Secondary | ICD-10-CM | POA: Diagnosis not present

## 2013-10-04 DIAGNOSIS — K5732 Diverticulitis of large intestine without perforation or abscess without bleeding: Secondary | ICD-10-CM | POA: Diagnosis not present

## 2013-10-04 DIAGNOSIS — R110 Nausea: Secondary | ICD-10-CM | POA: Diagnosis not present

## 2013-10-04 DIAGNOSIS — R1084 Generalized abdominal pain: Secondary | ICD-10-CM | POA: Diagnosis not present

## 2013-10-04 DIAGNOSIS — Z79899 Other long term (current) drug therapy: Secondary | ICD-10-CM | POA: Diagnosis not present

## 2013-10-04 DIAGNOSIS — K5730 Diverticulosis of large intestine without perforation or abscess without bleeding: Secondary | ICD-10-CM | POA: Diagnosis not present

## 2013-10-04 DIAGNOSIS — E1169 Type 2 diabetes mellitus with other specified complication: Secondary | ICD-10-CM | POA: Diagnosis not present

## 2013-10-04 DIAGNOSIS — I10 Essential (primary) hypertension: Secondary | ICD-10-CM | POA: Diagnosis not present

## 2013-10-04 DIAGNOSIS — R197 Diarrhea, unspecified: Secondary | ICD-10-CM | POA: Diagnosis not present

## 2013-10-04 DIAGNOSIS — R109 Unspecified abdominal pain: Secondary | ICD-10-CM | POA: Diagnosis not present

## 2013-10-09 ENCOUNTER — Emergency Department (HOSPITAL_COMMUNITY): Payer: Medicare Other

## 2013-10-09 ENCOUNTER — Inpatient Hospital Stay (HOSPITAL_COMMUNITY)
Admission: EM | Admit: 2013-10-09 | Discharge: 2013-10-19 | DRG: 286 | Disposition: A | Discharge status: Home / Self Care | Payer: Medicare Other | Attending: Cardiology | Admitting: Cardiology

## 2013-10-09 ENCOUNTER — Encounter (HOSPITAL_COMMUNITY): Payer: Self-pay | Admitting: Emergency Medicine

## 2013-10-09 DIAGNOSIS — R339 Retention of urine, unspecified: Secondary | ICD-10-CM | POA: Diagnosis not present

## 2013-10-09 DIAGNOSIS — I495 Sick sinus syndrome: Secondary | ICD-10-CM | POA: Diagnosis present

## 2013-10-09 DIAGNOSIS — I447 Left bundle-branch block, unspecified: Secondary | ICD-10-CM | POA: Diagnosis present

## 2013-10-09 DIAGNOSIS — E785 Hyperlipidemia, unspecified: Secondary | ICD-10-CM | POA: Diagnosis present

## 2013-10-09 DIAGNOSIS — I252 Old myocardial infarction: Secondary | ICD-10-CM

## 2013-10-09 DIAGNOSIS — I509 Heart failure, unspecified: Secondary | ICD-10-CM | POA: Diagnosis present

## 2013-10-09 DIAGNOSIS — R8271 Bacteriuria: Secondary | ICD-10-CM

## 2013-10-09 DIAGNOSIS — I498 Other specified cardiac arrhythmias: Secondary | ICD-10-CM | POA: Diagnosis not present

## 2013-10-09 DIAGNOSIS — Z7901 Long term (current) use of anticoagulants: Secondary | ICD-10-CM

## 2013-10-09 DIAGNOSIS — I2510 Atherosclerotic heart disease of native coronary artery without angina pectoris: Secondary | ICD-10-CM | POA: Diagnosis present

## 2013-10-09 DIAGNOSIS — M545 Low back pain, unspecified: Secondary | ICD-10-CM | POA: Diagnosis not present

## 2013-10-09 DIAGNOSIS — Z794 Long term (current) use of insulin: Secondary | ICD-10-CM

## 2013-10-09 DIAGNOSIS — E669 Obesity, unspecified: Secondary | ICD-10-CM | POA: Diagnosis present

## 2013-10-09 DIAGNOSIS — R9430 Abnormal result of cardiovascular function study, unspecified: Secondary | ICD-10-CM

## 2013-10-09 DIAGNOSIS — I2584 Coronary atherosclerosis due to calcified coronary lesion: Secondary | ICD-10-CM | POA: Diagnosis present

## 2013-10-09 DIAGNOSIS — Z79899 Other long term (current) drug therapy: Secondary | ICD-10-CM

## 2013-10-09 DIAGNOSIS — Z6833 Body mass index (BMI) 33.0-33.9, adult: Secondary | ICD-10-CM

## 2013-10-09 DIAGNOSIS — E119 Type 2 diabetes mellitus without complications: Secondary | ICD-10-CM | POA: Diagnosis present

## 2013-10-09 DIAGNOSIS — K219 Gastro-esophageal reflux disease without esophagitis: Secondary | ICD-10-CM | POA: Diagnosis present

## 2013-10-09 DIAGNOSIS — G2581 Restless legs syndrome: Secondary | ICD-10-CM | POA: Diagnosis present

## 2013-10-09 DIAGNOSIS — I959 Hypotension, unspecified: Secondary | ICD-10-CM | POA: Diagnosis present

## 2013-10-09 DIAGNOSIS — K224 Dyskinesia of esophagus: Secondary | ICD-10-CM | POA: Diagnosis present

## 2013-10-09 DIAGNOSIS — Z87891 Personal history of nicotine dependence: Secondary | ICD-10-CM

## 2013-10-09 DIAGNOSIS — D6859 Other primary thrombophilia: Secondary | ICD-10-CM | POA: Diagnosis present

## 2013-10-09 DIAGNOSIS — I5023 Acute on chronic systolic (congestive) heart failure: Secondary | ICD-10-CM | POA: Diagnosis present

## 2013-10-09 DIAGNOSIS — I10 Essential (primary) hypertension: Secondary | ICD-10-CM | POA: Diagnosis present

## 2013-10-09 DIAGNOSIS — I2119 ST elevation (STEMI) myocardial infarction involving other coronary artery of inferior wall: Secondary | ICD-10-CM | POA: Diagnosis not present

## 2013-10-09 DIAGNOSIS — R0609 Other forms of dyspnea: Secondary | ICD-10-CM | POA: Diagnosis not present

## 2013-10-09 DIAGNOSIS — I428 Other cardiomyopathies: Secondary | ICD-10-CM | POA: Diagnosis present

## 2013-10-09 DIAGNOSIS — R109 Unspecified abdominal pain: Secondary | ICD-10-CM | POA: Diagnosis not present

## 2013-10-09 DIAGNOSIS — R300 Dysuria: Secondary | ICD-10-CM

## 2013-10-09 DIAGNOSIS — I5021 Acute systolic (congestive) heart failure: Secondary | ICD-10-CM | POA: Diagnosis not present

## 2013-10-09 DIAGNOSIS — J61 Pneumoconiosis due to asbestos and other mineral fibers: Secondary | ICD-10-CM | POA: Diagnosis present

## 2013-10-09 DIAGNOSIS — I4891 Unspecified atrial fibrillation: Secondary | ICD-10-CM

## 2013-10-09 DIAGNOSIS — Z8547 Personal history of malignant neoplasm of testis: Secondary | ICD-10-CM

## 2013-10-09 DIAGNOSIS — Z8249 Family history of ischemic heart disease and other diseases of the circulatory system: Secondary | ICD-10-CM

## 2013-10-09 DIAGNOSIS — IMO0001 Reserved for inherently not codable concepts without codable children: Secondary | ICD-10-CM

## 2013-10-09 DIAGNOSIS — I517 Cardiomegaly: Secondary | ICD-10-CM | POA: Diagnosis not present

## 2013-10-09 DIAGNOSIS — R002 Palpitations: Secondary | ICD-10-CM | POA: Diagnosis not present

## 2013-10-09 DIAGNOSIS — R1310 Dysphagia, unspecified: Secondary | ICD-10-CM | POA: Diagnosis not present

## 2013-10-09 DIAGNOSIS — Z7982 Long term (current) use of aspirin: Secondary | ICD-10-CM | POA: Diagnosis not present

## 2013-10-09 DIAGNOSIS — I4819 Other persistent atrial fibrillation: Secondary | ICD-10-CM | POA: Diagnosis present

## 2013-10-09 DIAGNOSIS — K2100 Gastro-esophageal reflux disease with esophagitis, without bleeding: Secondary | ICD-10-CM | POA: Diagnosis not present

## 2013-10-09 DIAGNOSIS — R0989 Other specified symptoms and signs involving the circulatory and respiratory systems: Secondary | ICD-10-CM | POA: Diagnosis not present

## 2013-10-09 DIAGNOSIS — R0602 Shortness of breath: Secondary | ICD-10-CM | POA: Diagnosis not present

## 2013-10-09 HISTORY — DX: Other cardiomyopathies: I42.8

## 2013-10-09 HISTORY — DX: Unspecified atrial fibrillation: I48.91

## 2013-10-09 HISTORY — DX: Type 2 diabetes mellitus without complications: E11.9

## 2013-10-09 HISTORY — DX: Hyperlipidemia, unspecified: E78.5

## 2013-10-09 HISTORY — DX: Essential (primary) hypertension: I10

## 2013-10-09 HISTORY — DX: Atherosclerotic heart disease of native coronary artery without angina pectoris: I25.10

## 2013-10-09 LAB — CBC WITH DIFFERENTIAL/PLATELET
Basophils Absolute: 0 10*3/uL (ref 0.0–0.1)
Basophils Relative: 0 % (ref 0–1)
EOS ABS: 0.1 10*3/uL (ref 0.0–0.7)
EOS PCT: 1 % (ref 0–5)
HEMATOCRIT: 55.1 % — AB (ref 39.0–52.0)
HEMOGLOBIN: 18.5 g/dL — AB (ref 13.0–17.0)
LYMPHS ABS: 2.1 10*3/uL (ref 0.7–4.0)
LYMPHS PCT: 21 % (ref 12–46)
MCH: 33.1 pg (ref 26.0–34.0)
MCHC: 33.6 g/dL (ref 30.0–36.0)
MCV: 98.6 fL (ref 78.0–100.0)
MONOS PCT: 8 % (ref 3–12)
Monocytes Absolute: 0.8 10*3/uL (ref 0.1–1.0)
Neutro Abs: 7 10*3/uL (ref 1.7–7.7)
Neutrophils Relative %: 70 % (ref 43–77)
Platelets: 265 10*3/uL (ref 150–400)
RBC: 5.59 MIL/uL (ref 4.22–5.81)
RDW: 14.9 % (ref 11.5–15.5)
WBC: 10 10*3/uL (ref 4.0–10.5)

## 2013-10-09 LAB — URINE MICROSCOPIC-ADD ON

## 2013-10-09 LAB — URINALYSIS, ROUTINE W REFLEX MICROSCOPIC
Glucose, UA: NEGATIVE mg/dL
Hgb urine dipstick: NEGATIVE
Ketones, ur: 15 mg/dL — AB
Nitrite: NEGATIVE
PROTEIN: NEGATIVE mg/dL
Specific Gravity, Urine: 1.024 (ref 1.005–1.030)
Urobilinogen, UA: 2 mg/dL — ABNORMAL HIGH (ref 0.0–1.0)
pH: 6 (ref 5.0–8.0)

## 2013-10-09 LAB — TROPONIN I

## 2013-10-09 LAB — PROTIME-INR
INR: 1.12 (ref 0.00–1.49)
Prothrombin Time: 14.4 seconds (ref 11.6–15.2)

## 2013-10-09 LAB — COMPREHENSIVE METABOLIC PANEL
ALT: 65 U/L — AB (ref 0–53)
AST: 34 U/L (ref 0–37)
Albumin: 3.9 g/dL (ref 3.5–5.2)
Alkaline Phosphatase: 51 U/L (ref 39–117)
Anion gap: 15 (ref 5–15)
BUN: 25 mg/dL — AB (ref 6–23)
CALCIUM: 8.9 mg/dL (ref 8.4–10.5)
CO2: 29 mEq/L (ref 19–32)
Chloride: 98 mEq/L (ref 96–112)
Creatinine, Ser: 1.05 mg/dL (ref 0.50–1.35)
GFR calc non Af Amer: 70 mL/min — ABNORMAL LOW (ref >90)
GFR, EST AFRICAN AMERICAN: 82 mL/min — AB (ref >90)
GLUCOSE: 124 mg/dL — AB (ref 70–99)
Potassium: 4.5 mEq/L (ref 3.7–5.3)
Sodium: 142 mEq/L (ref 137–147)
Total Bilirubin: 1.2 mg/dL (ref 0.3–1.2)
Total Protein: 6.4 g/dL (ref 6.0–8.3)

## 2013-10-09 LAB — TSH: TSH: 1.42 u[IU]/mL (ref 0.350–4.500)

## 2013-10-09 LAB — PRO B NATRIURETIC PEPTIDE: PRO B NATRI PEPTIDE: 1484 pg/mL — AB (ref 0–125)

## 2013-10-09 LAB — GLUCOSE, CAPILLARY: GLUCOSE-CAPILLARY: 162 mg/dL — AB (ref 70–99)

## 2013-10-09 LAB — HEPARIN LEVEL (UNFRACTIONATED): Heparin Unfractionated: 0.22 IU/mL — ABNORMAL LOW (ref 0.30–0.70)

## 2013-10-09 LAB — APTT: aPTT: 99 seconds — ABNORMAL HIGH (ref 24–37)

## 2013-10-09 MED ORDER — HEPARIN (PORCINE) IN NACL 100-0.45 UNIT/ML-% IJ SOLN
2300.0000 [IU]/h | INTRAMUSCULAR | Status: DC
Start: 2013-10-09 — End: 2013-10-12
  Administered 2013-10-09: 1700 [IU]/h via INTRAVENOUS
  Administered 2013-10-09: 1500 [IU]/h via INTRAVENOUS
  Administered 2013-10-10: 1900 [IU]/h via INTRAVENOUS
  Administered 2013-10-10: 2100 [IU]/h via INTRAVENOUS
  Administered 2013-10-11 (×2): 2600 [IU]/h via INTRAVENOUS
  Administered 2013-10-12: 2300 [IU]/h via INTRAVENOUS
  Filled 2013-10-09 (×9): qty 250

## 2013-10-09 MED ORDER — ONDANSETRON HCL 4 MG/2ML IJ SOLN
4.0000 mg | Freq: Four times a day (QID) | INTRAMUSCULAR | Status: DC | PRN
Start: 2013-10-09 — End: 2013-10-12
  Administered 2013-10-10: 4 mg via INTRAVENOUS

## 2013-10-09 MED ORDER — ASPIRIN 81 MG PO CHEW
81.0000 mg | CHEWABLE_TABLET | Freq: Two times a day (BID) | ORAL | Status: DC
  Administered 2013-10-09 – 2013-10-12 (×7): 81 mg via ORAL
  Filled 2013-10-09 (×9): qty 1

## 2013-10-09 MED ORDER — ALBUTEROL SULFATE (2.5 MG/3ML) 0.083% IN NEBU: 2.5000 mg | INHALATION_SOLUTION | Freq: Every day | RESPIRATORY_TRACT | Status: DC | PRN

## 2013-10-09 MED ORDER — PANTOPRAZOLE SODIUM 40 MG PO TBEC
40.0000 mg | DELAYED_RELEASE_TABLET | Freq: Every day | ORAL | Status: DC
  Administered 2013-10-10 – 2013-10-19 (×10): 40 mg via ORAL
  Filled 2013-10-09 (×8): qty 1

## 2013-10-09 MED ORDER — OMEGA-3-ACID ETHYL ESTERS 1 G PO CAPS
1.0000 g | ORAL_CAPSULE | Freq: Two times a day (BID) | ORAL | Status: DC
  Administered 2013-10-09 – 2013-10-19 (×20): 1 g via ORAL
  Filled 2013-10-09 (×21): qty 1

## 2013-10-09 MED ORDER — HEPARIN BOLUS VIA INFUSION
4000.0000 [IU] | Freq: Once | INTRAVENOUS | Status: AC
  Administered 2013-10-09: 4000 [IU] via INTRAVENOUS
  Filled 2013-10-09: qty 4000

## 2013-10-09 MED ORDER — ADENOSINE 6 MG/2ML IV SOLN
6.0000 mg | Freq: Once | INTRAVENOUS | Status: AC
Start: 2013-10-09 — End: 2013-10-09
  Administered 2013-10-09: 6 mg via INTRAVENOUS

## 2013-10-09 MED ORDER — ACETAMINOPHEN 325 MG PO TABS: 650.0000 mg | ORAL_TABLET | ORAL | Status: DC | PRN

## 2013-10-09 MED ORDER — ADENOSINE 6 MG/2ML IV SOLN
INTRAVENOUS | Status: AC
  Administered 2013-10-09: 13:00:00 6 mg via INTRAVENOUS
  Filled 2013-10-09: qty 6

## 2013-10-09 MED ORDER — ROPINIROLE HCL 1 MG PO TABS
1.0000 mg | ORAL_TABLET | Freq: Every day | ORAL | Status: DC
  Administered 2013-10-09 – 2013-10-18 (×10): 1 mg via ORAL
  Filled 2013-10-09 (×11): qty 1

## 2013-10-09 MED ORDER — HYDROCODONE-ACETAMINOPHEN 5-325 MG PO TABS
2.0000 | ORAL_TABLET | Freq: Four times a day (QID) | ORAL | Status: DC | PRN
  Administered 2013-10-09: 2 via ORAL
  Administered 2013-10-11: 1 via ORAL
  Administered 2013-10-12 – 2013-10-13 (×4): 2 via ORAL
  Administered 2013-10-14: 1 via ORAL
  Administered 2013-10-14 – 2013-10-18 (×5): 2 via ORAL
  Filled 2013-10-09 (×3): qty 2
  Filled 2013-10-09: qty 1
  Filled 2013-10-09 (×9): qty 2
  Filled 2013-10-09: qty 1

## 2013-10-09 MED ORDER — SODIUM CHLORIDE 0.9 % IV SOLN
Freq: Once | INTRAVENOUS | Status: AC
  Administered 2013-10-09: 1000 mL via INTRAVENOUS

## 2013-10-09 MED ORDER — CELECOXIB 200 MG PO CAPS
200.0000 mg | ORAL_CAPSULE | Freq: Every day | ORAL | Status: DC
  Administered 2013-10-09 – 2013-10-11 (×3): 200 mg via ORAL
  Filled 2013-10-09 (×5): qty 1

## 2013-10-09 MED ORDER — ALBUTEROL SULFATE HFA 108 (90 BASE) MCG/ACT IN AERS: 2.0000 | INHALATION_SPRAY | RESPIRATORY_TRACT | Status: DC | PRN

## 2013-10-09 MED ORDER — VITAMIN D3 25 MCG (1000 UNIT) PO TABS
1000.0000 [IU] | ORAL_TABLET | Freq: Every day | ORAL | Status: DC
  Administered 2013-10-10 – 2013-10-19 (×10): 1000 [IU] via ORAL
  Filled 2013-10-09 (×11): qty 1

## 2013-10-09 MED ORDER — LISINOPRIL 40 MG PO TABS
40.0000 mg | ORAL_TABLET | Freq: Every day | ORAL | Status: DC
  Administered 2013-10-09: 40 mg via ORAL
  Filled 2013-10-09: qty 1

## 2013-10-09 MED ORDER — ADENOSINE 6 MG/2ML IV SOLN
6.0000 mg | Freq: Once | INTRAVENOUS | Status: AC
  Administered 2013-10-09: 6 mg via INTRAVENOUS

## 2013-10-09 MED ORDER — DILTIAZEM LOAD VIA INFUSION
20.0000 mg | Freq: Once | INTRAVENOUS | Status: DC
  Filled 2013-10-09 (×3): qty 20

## 2013-10-09 MED ORDER — HEPARIN BOLUS VIA INFUSION
1500.0000 [IU] | Freq: Once | INTRAVENOUS | Status: AC
  Administered 2013-10-09: 1500 [IU] via INTRAVENOUS
  Filled 2013-10-09: qty 1500

## 2013-10-09 MED ORDER — SODIUM CHLORIDE 0.9 % IV SOLN
Freq: Once | INTRAVENOUS | Status: AC
  Administered 2013-10-09: 14:00:00 via INTRAVENOUS

## 2013-10-09 MED ORDER — INSULIN ASPART 100 UNIT/ML ~~LOC~~ SOLN
0.0000 [IU] | Freq: Three times a day (TID) | SUBCUTANEOUS | Status: DC
  Administered 2013-10-10 – 2013-10-12 (×3): 2 [IU] via SUBCUTANEOUS

## 2013-10-09 MED ORDER — ADENOSINE 6 MG/2ML IV SOLN
INTRAVENOUS | Status: AC
  Administered 2013-10-09: 13:00:00 6 mg via INTRAVENOUS
  Filled 2013-10-09: qty 4

## 2013-10-09 MED ORDER — ROPINIROLE HCL 0.5 MG PO TABS
0.5000 mg | ORAL_TABLET | Freq: Every day | ORAL | Status: DC | PRN
  Administered 2013-10-12: 0.5 mg via ORAL
  Filled 2013-10-09 (×2): qty 1

## 2013-10-09 MED ORDER — DILTIAZEM HCL 25 MG/5ML IV SOLN
20.0000 mg | Freq: Once | INTRAVENOUS | Status: AC
  Administered 2013-10-09: 20 mg via INTRAVENOUS

## 2013-10-09 MED ORDER — HYDROCHLOROTHIAZIDE 25 MG PO TABS
25.0000 mg | ORAL_TABLET | Freq: Every day | ORAL | Status: DC
Start: 2013-10-09 — End: 2013-10-13
  Administered 2013-10-09 – 2013-10-13 (×5): 25 mg via ORAL
  Filled 2013-10-09 (×6): qty 1

## 2013-10-09 MED ORDER — DEXTROSE 5 % IV SOLN
5.0000 mg/h | INTRAVENOUS | Status: DC
  Administered 2013-10-09: 10 mg/h via INTRAVENOUS
  Administered 2013-10-09: 15 mg/h via INTRAVENOUS
  Administered 2013-10-09: 10 mg/h via INTRAVENOUS
  Administered 2013-10-09 – 2013-10-12 (×8): 15 mg/h via INTRAVENOUS
  Filled 2013-10-09 (×10): qty 100

## 2013-10-09 MED ORDER — METFORMIN HCL 500 MG PO TABS
500.0000 mg | ORAL_TABLET | Freq: Two times a day (BID) | ORAL | Status: DC
  Filled 2013-10-09 (×3): qty 1

## 2013-10-09 NOTE — H&P (Addendum)
CARDIOLOGY HISTORY AND PHYSICAL EXAMINATION NOTE.  NAME:  Walter [name]   MRN: [id number] DOB:  [birth year]   ADMIT DATE: 10/09/2013  Reason for Admission: Atrial fibrillation with rapid ventricular rate  Requesting Physician: Dr. Vanita Panda, [place]  Primary Cardiologist: Dr. Loralie Champagne Primary Care Provider: London Pepper, MD  HPI: Walter [name] is a 68 y.o. male with a past medical history significant for nonischemic cardiomyopathy and chronic left bundle-branch block type 2 diabetes, hypertension and hyperlipidemia as noted below.  He has had a relatively extensive cardiac evaluation for the bundle branch block with a negative cardiac catheterization in 2011 when he was living in [facility]. He recently saw Dr. Aundra Dubin to establish cardiology care in March of this year and had a repeat echocardiogram that showed an improved ejection fraction closer to normal with left bundle-branch block wall motion abnormality.  He had done fine from a cardiac standpoint with no problems, but about 10 days ago he started noting significantly more dyspneic especially with activities to the point of even walking around the house. Noted mild edema and mild nausea. There was some dizziness but no syncope or near syncope. He was really fatigued analyze weakness. He actually had several days of diarrhea and abdominal pain that triggered his calling EMS today.  Upon initial evaluation in the emergency room he was found to be, tachycardic with heart rates in the 190s that was very concerning given his left bundle branch block. He remained hemodynamically stable and for the most part asymptomatic besides his presenting abdominal discomfort and dyspnea. EKG and telemetry shows were more consistent with either SVT or atrial fibrillation. In the emergency room we gave 6 follow up on an 18 mcg of IV adenosine with no effect at all. At that point it was determined that the rhythm was probably atrial fibrillation and  the patient started on a diltiazem drip all an initial bolus. His heart rate began to drop nicely into the low 100s shortly thereafter. He did receive 1 more bolus and titration of his diltiazem drip prior to leaving the ER. He felt notably better with reduced heart rate.  He denies having any episodes of chest discomfort/pressure or squeezing with either rest or exertion. He has had some mild orthopnea and edema since the onset of symptoms but is mostly noted fatigue and dyspnea.  No syncope/near syncope or TIA/amaurosis fugax symptoms. No melena, hematochezia, hematuria or epistaxis. He has had intermittent chills and sweats but no signs of symptoms otherwise to suggest that fever. He's had this type of symptom before without being his ileus he is today.  By the time his heart rate was reduced his abdominal discomfort was also improved.  Past Medical History  Diagnosis Date  . Essential hypertension   . DJD (degenerative joint disease)     /osteoarthritis  . Frozen shoulder     right  . Chronic left hip pain   . Chronic low back pain     /sciatica- Dr Letta [facility])  . Asbestosis     mild  . GERD (gastroesophageal reflux disease)   . Hypogonadism male   . Coronary artery disease, non-occlusive June 2011    Cardiac cath in [facility] following abnormal Myoview  . Left bundle branch block (LBBB) on electrocardiogram     Diagnosed close to 20 years ago  . Obesity   . Factor V Leiden   . Testicular cancer 1972    left  . Type 2 diabetes  mellitus without complication 0/6301    Type 2.   . Mild Cardiomyopathy, nonischemic June 2011    EF 45-50%; suspect related to LBBB; repeat Echo March 2015: EF 50- 55%  . Dyslipidemia, goal LDL below 100     On lovastatin (myalgias with Zocor and Lipitor)   Past Surgical History  Procedure Laterality Date  . Lymph nodes    . Tonsillectomy Bilateral   . Surgery scrotal / testicular Left   . Appendectomy N/A   . Rotator  cuff repair Right   . Cholecystectomy open    . Hernia repair    . Knee arthroscopy Left   . Cardiac catheterization  June 2011    Nonobstructive CAD  . Transthoracic echocardiogram  May 2011    EF 45% with diffuse hypokinesis; septal bounce from LBBB  . Transthoracic echocardiogram  March 2015    EF 50-55%. Mild concentric LVH. Septal dyssynergy from LBBB     FAMHx: Family History  Problem Relation Age of Onset  . Hypertension Mother   . Heart disease Mother   . Heart disease Father   . Hypertension Brother   . Heart attack Maternal Uncle   . Heart disease Paternal Uncle   . Hypertension Brother     SOCHx:  reports that he has quit smoking. His smoking use included Cigarettes. He smoked 0.00 packs per day. He does not have any smokeless tobacco history on file. He reports that he drinks alcohol. He reports that he does not use illicit drugs.  ALLERGIES: Allergies  Allergen Reactions  . Statins     Myalgias.  Can only take Altoprev     A comprehensive wrist views systems was performed Review of Systems  Constitutional: Positive for chills. Negative for fever and weight loss.       Sweating and clammy today  Eyes: Negative.   Respiratory: Positive for shortness of breath. Negative for cough, hemoptysis, sputum production and wheezing.   Cardiovascular: Positive for palpitations, orthopnea and leg swelling. Negative for chest pain and claudication.  Gastrointestinal: Positive for nausea, abdominal pain and diarrhea. Negative for blood in stool and melena.  Genitourinary: Negative for dysuria, urgency and flank pain.  Musculoskeletal: Positive for joint pain.       Generalized weakness  Neurological: Positive for dizziness and weakness. Negative for focal weakness.  Endo/Heme/Allergies: Negative.   Psychiatric/Behavioral: Negative.  Negative for depression, memory loss and substance abuse. The patient is not nervous/anxious.   All other systems reviewed and are  negative.    HOME MEDICATIONS: Prescriptions prior to admission  Medication Sig Dispense Refill  . albuterol (PROVENTIL HFA;VENTOLIN HFA) 108 (90 BASE) MCG/ACT inhaler Inhale 2 puffs into the lungs as needed for wheezing or shortness of breath.      Marland Kitchen aspirin 81 MG tablet Take 81 mg by mouth 2 (two) times daily.      . celecoxib (CELEBREX) 200 MG capsule Take 200 mg by mouth daily.      . cholecalciferol (VITAMIN D) 1000 UNITS tablet Take 1,000 Units by mouth daily.      Marland Kitchen esomeprazole (NEXIUM) 40 MG capsule Take 40 mg by mouth daily at 12 noon.      . hydrochlorothiazide (HYDRODIURIL) 25 MG tablet Take 25 mg by mouth daily.      Marland Kitchen HYDROcodone-acetaminophen (NORCO/VICODIN) 5-325 MG per tablet Take 2 tablets by mouth every 6 (six) hours as needed for moderate pain.      Marland Kitchen lisinopril (PRINIVIL,ZESTRIL) 40 MG tablet Take 40  mg by mouth daily.      Marland Kitchen loperamide (IMODIUM) 2 MG capsule Take by mouth as needed for diarrhea or loose stools.      . lovastatin (ALTOPREV) 60 MG 24 hr tablet Take 60 mg by mouth at bedtime.      . metFORMIN (GLUCOPHAGE) 500 MG tablet Take 500 mg by mouth 2 (two) times daily with a meal.      . omega-3 acid ethyl esters (LOVAZA) 1 G capsule Take 1 g by mouth 2 (two) times daily.      Marland Kitchen rOPINIRole (REQUIP) 1 MG tablet Take 1 mg by mouth at bedtime.      . sildenafil (VIAGRA) 100 MG tablet Take 100 mg by mouth daily as needed for erectile dysfunction.      Marland Kitchen tiZANidine (ZANAFLEX) 4 MG capsule Take 4 mg by mouth as needed for muscle spasms.      . magnesium oxide (MAG-OX) 400 MG tablet Take 400 mg by mouth daily.      Marland Kitchen rOPINIRole (REQUIP) 0.5 MG tablet Take 0.5 mg by mouth daily as needed.      . testosterone cypionate (DEPO-TESTOSTERONE) 200 MG/ML injection Inject into the muscle every 7 (seven) days.       PHYSICAL EXAM: VITALS: Blood pressure 91/61, pulse 96, temperature 97.7 F (36.5 C), temperature source Oral, resp. rate 18, height 6' (1.829 m), weight 250 lb  (113.399 kg), SpO2 97.00%. Physical Exam  Constitutional: He is oriented to person, place, and time. He appears well-developed and well-nourished. No distress.  Mildly sweaty but not diaphoretic  HENT:  Head: Normocephalic and atraumatic.  Mouth/Throat: Oropharynx is clear and moist.  Eyes: Conjunctivae and EOM are normal. Pupils are equal, round, and reactive to light. No scleral icterus.  Neck: Neck supple. No tracheal deviation present. No thyromegaly present.  Pulsatile jugular veins but no real distention.  Cardiovascular:  Irregularly irregular rhythm with rapid rate. Split S2 without significant M./R./G. Mostly related to extreme tachycardia  Pulmonary/Chest: Effort normal. No stridor. No respiratory distress. He has no wheezes. He exhibits no tenderness.  Mild basilar rales, but cleared with cough  Abdominal: Soft. Bowel sounds are normal. He exhibits no distension and no mass. There is tenderness. There is no rebound and no guarding.  He has tenderness along a midline incision which is the site of a prior surgery followed by herniorrhaphy. No palpable hernia.  Genitourinary:  Deferred  Musculoskeletal: Normal range of motion.  Trace lower or edema  Neurological: He is alert and oriented to person, place, and time. He has normal reflexes. No cranial nerve deficit. Coordination normal.  Skin: Skin is warm and dry. No rash noted.  Psychiatric: He has a normal mood and affect. Judgment and thought content normal.    LABS: Results for orders placed during the hospital encounter of 10/09/13 (from the past 24 hour(s))  CBC WITH DIFFERENTIAL     Status: Abnormal   Collection Time    10/09/13 12:50 PM      Result Value Ref Range   WBC 10.0  4.0 - 10.5 K/uL   RBC 5.59  4.22 - 5.81 MIL/uL   Hemoglobin 18.5 (*) 13.0 - 17.0 g/dL   HCT 55.1 (*) 39.0 - 52.0 %   MCV 98.6  78.0 - 100.0 fL   MCH 33.1  26.0 - 34.0 pg   MCHC 33.6  30.0 - 36.0 g/dL   RDW 14.9  11.5 - 15.5 %   Platelets 265   150 -  400 K/uL   Neutrophils Relative % 70  43 - 77 %   Neutro Abs 7.0  1.7 - 7.7 K/uL   Lymphocytes Relative 21  12 - 46 %   Lymphs Abs 2.1  0.7 - 4.0 K/uL   Monocytes Relative 8  3 - 12 %   Monocytes Absolute 0.8  0.1 - 1.0 K/uL   Eosinophils Relative 1  0 - 5 %   Eosinophils Absolute 0.1  0.0 - 0.7 K/uL   Basophils Relative 0  0 - 1 %   Basophils Absolute 0.0  0.0 - 0.1 K/uL  COMPREHENSIVE METABOLIC PANEL     Status: Abnormal   Collection Time    10/09/13 12:50 PM      Result Value Ref Range   Sodium 142  137 - 147 mEq/L   Potassium 4.5  3.7 - 5.3 mEq/L   Chloride 98  96 - 112 mEq/L   CO2 29  19 - 32 mEq/L   Glucose, Bld 124 (*) 70 - 99 mg/dL   BUN 25 (*) 6 - 23 mg/dL   Creatinine, Ser 1.05  0.50 - 1.35 mg/dL   Calcium 8.9  8.4 - 10.5 mg/dL   Total Protein 6.4  6.0 - 8.3 g/dL   Albumin 3.9  3.5 - 5.2 g/dL   AST 34  0 - 37 U/L   ALT 65 (*) 0 - 53 U/L   Alkaline Phosphatase 51  39 - 117 U/L   Total Bilirubin 1.2  0.3 - 1.2 mg/dL   GFR calc non Af Amer 70 (*) >90 mL/min   GFR calc Af Amer 82 (*) >90 mL/min   Anion gap 15  5 - 15  TROPONIN I     Status: None   Collection Time    10/09/13 12:50 PM      Result Value Ref Range   Troponin I <0.30  <0.30 ng/mL  APTT     Status: Abnormal   Collection Time    10/09/13  3:00 PM      Result Value Ref Range   aPTT 99 (*) 24 - 37 seconds  PROTIME-INR     Status: None   Collection Time    10/09/13  3:00 PM      Result Value Ref Range   Prothrombin Time 14.4  11.6 - 15.2 seconds   INR 1.12  0.00 - 1.49  URINALYSIS, ROUTINE W REFLEX MICROSCOPIC     Status: Abnormal   Collection Time    10/09/13  3:26 PM      Result Value Ref Range   Color, Urine AMBER (*) YELLOW   APPearance CLEAR  CLEAR   Specific Gravity, Urine 1.024  1.005 - 1.030   pH 6.0  5.0 - 8.0   Glucose, UA NEGATIVE  NEGATIVE mg/dL   Hgb urine dipstick NEGATIVE  NEGATIVE   Bilirubin Urine SMALL (*) NEGATIVE   Ketones, ur 15 (*) NEGATIVE mg/dL   Protein, ur  NEGATIVE  NEGATIVE mg/dL   Urobilinogen, UA 2.0 (*) 0.0 - 1.0 mg/dL   Nitrite NEGATIVE  NEGATIVE   Leukocytes, UA SMALL (*) NEGATIVE  URINE MICROSCOPIC-ADD ON     Status: Abnormal   Collection Time    10/09/13  3:26 PM      Result Value Ref Range   Squamous Epithelial / LPF FEW (*) RARE   WBC, UA 11-20  <3 WBC/hpf   RBC / HPF 0-2  <3 RBC/hpf   Bacteria, UA RARE  RARE  TROPONIN I     Status: None   Collection Time    10/09/13  6:45 PM      Result Value Ref Range   Troponin I <0.30  <0.30 ng/mL  TSH     Status: None   Collection Time    10/09/13  6:45 PM      Result Value Ref Range   TSH 1.420  0.350 - 4.500 uIU/mL  PRO B NATRIURETIC PEPTIDE     Status: Abnormal   Collection Time    10/09/13  6:45 PM      Result Value Ref Range   Pro B Natriuretic peptide (BNP) 1484.0 (*) 0 - 125 pg/mL  GLUCOSE, CAPILLARY     Status: Abnormal   Collection Time    10/09/13  8:59 PM      Result Value Ref Range   Glucose-Capillary 162 (*) 70 - 99 mg/dL   Comment 1 Documented in Chart     Comment 2 Notify RN    HEPARIN LEVEL (UNFRACTIONATED)     Status: Abnormal   Collection Time    10/09/13  9:06 PM      Result Value Ref Range   Heparin Unfractionated 0.22 (*) 0.30 - 0.70 IU/mL    IMAGING:  Chest x-ray: Mild cardia megaly with no signs of CHF. Calcified pleural plaques consistent with prior asbestosis. No change from CT and chest x-ray of 2014  EKG 1: Wide-complex tachycardia; most likely Left Bundle Branch Block with some irregularity suggestive of A. fib versus SVT  On telemetry, following adenosine, no response; following IV diltiazem heart rate dropped down into the high 90s to low 100s. Then averaged in the 120s.  IMPRESSION: Active Problems:   Atrial fibrillation with RVR   Essential hypertension, benign   Non-occlusive coronary artery disease   Restless leg syndrome  PLAN:  Admit to telemetry with atrial fibrillation order set -- I was personally involved in the  administration of adenosine and IV diltiazem in the emergency room.  Continue IV diltiazem drip, would hope that he would spontaneously convert overnight. Would only cardiovert if he becomes hemodynamically unstable. He was stable during a prolonged episode of extremely rapid tachycardia. Given that his symptoms began close to 10 days ago, would need to have at least 4-6 weeks of full affect regulation prior to considering cardioversion  For now would simply work on rate control with calcium channel blocker. Will need to monitor and blood pressure response to diltiazem.  Will anticoagulate with IV heparin pending completion of initial evaluation with nuclear Myoview stress test to assess for any potential ischemic etiology. Once there is determination of plus or minus invasive evaluation, can then determine the ability to initiate warfarin  If he becomes intolerant of even rate controlled atrial fibrillation may need to consider rhythm control. For now we'll just opt for rate control until symptoms on medications appear inadequately treated.   if no cardiac catheterization is required, could consider antiarrhythmic agents such as flecainide procainamide provided his EF is remains also stable on recheck echo.  Ischemic evaluation with Myoview tomorrow ; would need to know cardiac function as well as any potential ischemia prior to determining the appropriateness of the potential use of antiarrhythmic agents  Cyclosporine and levels and check pro BNP as he is noting some dyspnea.  Check TSH   For diabetes, hold metformin and use sliding scale insulin with no basal insulin.  Blood pressure stable, continue home antihypertensives but monitor for hypotension  in setting of IV diltiazem. Would likely reduce dose of ACE inhibitor.  Continue home medications for her restless leg.    Time Spent Directly with Patient: 60-70 minutes  Walter [name] W, M.D., M.S. Interventional Cardiologist    Pager # [phone] 10/10/2013

## 2013-10-09 NOTE — ED Notes (Signed)
Positive response to cardizem.  HR decreased to 128. Pt states decreased sob.

## 2013-10-09 NOTE — ED Provider Notes (Addendum)
CSN: [id number]     Arrival date & time 10/09/13  1231 History   First MD Initiated Contact with Patient 10/09/13 1231     Chief Complaint  Patient presents with  . Tachycardia     (Consider location/radiation/quality/duration/timing/severity/associated sxs/prior Treatment) HPI Patient presents with concern dyspnea, fatigue. Symptoms began approximately one week ago, initially with an episode of GI like illness, including diarrhea, abdominal discomfort. After the onset of symptoms the patient was seen at another facility, had CT scan of the abdomen which is unremarkable. Patient continued fatigue, dyspnea, generalized weakness for several days. Patient denies chest pain, current abdominal pain, though he states his stomach is unsettled. No current diarrhea, vomiting, confusion, disorientation. Patient has a history of left bundle branch block, no other arrhythmia. Patient traveled via airplane one week ago, one hour flight. No clear alleviating or exacerbating factors.  Past Medical History  Diagnosis Date  . Hypertension   . DJD (degenerative joint disease)     /osteoarthritis  . Frozen shoulder     right  . Chronic left hip pain   . Chronic low back pain     /sciatica- Dr Letta Median [facility])  . Asbestosis     mild  . GERD (gastroesophageal reflux disease)   . Hypogonadism male   . Testicular cancer 1972    left  . Coronary artery disease   . Left bundle branch block (LBBB) on electrocardiogram   . Obesity   . Factor V Leiden   . Diabetes mellitus 06/2011    Type 2.    Past Surgical History  Procedure Laterality Date  . Lymph nodes    . Tonsillectomy Bilateral   . Surgery scrotal / testicular Left   . Appendectomy N/A   . Rotator cuff repair Right   . Cholecystectomy open    . Hernia repair    . Knee arthroscopy Left    Family History  Problem Relation Age of Onset  . Hypertension Mother   . Heart disease Mother   . Heart disease Father   .  Hypertension Brother   . Heart attack Maternal Uncle   . Heart disease Paternal Uncle   . Hypertension Brother    History  Substance Use Topics  . Smoking status: Former Smoker    Types: Cigarettes  . Smokeless tobacco: Not on file  . Alcohol Use: Yes     Comment: rare    Review of Systems  Constitutional:       Per HPI, otherwise negative  HENT:       Per HPI, otherwise negative  Respiratory:       Per HPI, otherwise negative  Cardiovascular:       Per HPI, otherwise negative  Gastrointestinal: Positive for abdominal pain and diarrhea. Negative for vomiting.  Endocrine:       Negative aside from HPI  Genitourinary:       Neg aside from HPI   Musculoskeletal:       Per HPI, otherwise negative  Skin: Negative.   Neurological: Negative for syncope.      Allergies  Statins  Home Medications   Prior to Admission medications   Medication Sig Start Date End Date Taking? Authorizing Provider  albuterol (PROVENTIL HFA;VENTOLIN HFA) 108 (90 BASE) MCG/ACT inhaler Inhale 2 puffs into the lungs as needed for wheezing or shortness of breath.    Historical Provider, MD  aspirin 81 MG tablet Take 81 mg by mouth daily.    Historical Provider, MD  celecoxib (CELEBREX) 200 MG capsule Take 200 mg by mouth daily.    Historical Provider, MD  Cholecalciferol (VITAMIN D-3) 1000 UNITS CAPS Take 1 capsule by mouth daily.    Historical Provider, MD  esomeprazole (NEXIUM) 40 MG capsule Take 40 mg by mouth daily at 12 noon.    Historical Provider, MD  hydrochlorothiazide (HYDRODIURIL) 25 MG tablet Take 25 mg by mouth daily.    Historical Provider, MD  HYDROcodone-acetaminophen (LORCET) 10-650 MG per tablet Take 1 tablet by mouth as needed for pain (Twice a day as needed for pain).    Historical Provider, MD  lisinopril (PRINIVIL,ZESTRIL) 40 MG tablet Take 40 mg by mouth daily.    Historical Provider, MD  LORazepam (ATIVAN) 1 MG tablet Take 1 mg by mouth. As needed 30 mins prior to procedure     Historical Provider, MD  lovastatin (ALTOPREV) 60 MG 24 hr tablet Take 60 mg by mouth at bedtime.    Historical Provider, MD  magnesium oxide (MAG-OX) 400 MG tablet Take 400 mg by mouth daily.    Historical Provider, MD  metFORMIN (GLUCOPHAGE) 1000 MG tablet Take 1,000 mg by mouth 2 (two) times daily with a meal.    Historical Provider, MD  omega-3 acid ethyl esters (LOVAZA) 1 G capsule Take 1 g by mouth 2 (two) times daily.    Historical Provider, MD  rOPINIRole (REQUIP) 0.5 MG tablet Take 0.5 mg by mouth daily as needed.    Historical Provider, MD  rOPINIRole (REQUIP) 1 MG tablet Take 1 mg by mouth as needed.    Historical Provider, MD  sildenafil (VIAGRA) 100 MG tablet Take 100 mg by mouth daily as needed for erectile dysfunction.    Historical Provider, MD  temazepam (RESTORIL) 15 MG capsule Take 15 mg by mouth at bedtime as needed for sleep.    Historical Provider, MD  testosterone cypionate (DEPO-TESTOSTERONE) 200 MG/ML injection Inject into the muscle every 7 (seven) days.    Historical Provider, MD  tiZANidine (ZANAFLEX) 4 MG capsule Take 4 mg by mouth as needed for muscle spasms.    Historical Provider, MD   BP 122/109  Pulse 47  Temp(Src) 97.4 F (36.3 C) (Oral)  Resp 12  Ht 6' (1.829 m)  Wt 250 lb (113.399 kg)  BMI 33.90 kg/m2  SpO2 98% Physical Exam  Nursing note and vitals reviewed. Constitutional: He is oriented to person, place, and time. He appears well-developed. No distress.  HENT:  Head: Normocephalic and atraumatic.  Eyes: Conjunctivae and EOM are normal.  Cardiovascular: Regular rhythm.  Tachycardia present.   Pulmonary/Chest: Effort normal. No stridor. No respiratory distress.  Abdominal: He exhibits no distension.  Musculoskeletal: He exhibits no edema.  Neurological: He is alert and oriented to person, place, and time.  Skin: Skin is warm and dry.  Psychiatric: He has a normal mood and affect.    ED Course  Procedures (including critical care time) Labs  Review Labs Reviewed  CBC WITH DIFFERENTIAL  COMPREHENSIVE METABOLIC PANEL  URINALYSIS, ROUTINE W REFLEX MICROSCOPIC  TROPONIN I   Immediately after the initial evaluation was early evidence of pronounced tachycardia, patient had resuscitation pads applied, oxygen provided and was placed on monitors. Initial cardiac rhythm on monitor was regular, 190, abnormal  EKG shows SVT versus A. fib with rapid ventricular response, rate 183-abnormal   Imaging Review No results found.  After my initial evaluation I discussed the patient's case with our cardiology team.     After the patient had  resuscitation measures applied, received IV fluids, he received 6 mg of adenosine, then 12, then 18, all without change in his rhythm.  Subsequently the patient was started on a Cardizem drip after receiving a bolus.  Patient's heart rate decreased into the 105 range, with no substantial change in his blood pressure, but with improved symptoms.  Update: Patient much better.  HR 100-110    Labs notable for elevated RBC without other notable bloodline abnormailities MDM   Patient presents with dyspnea, fatigue, and on initial exam is found to have atrial fibrillation with rapid ventricular response with a pronounced tachycardia.  After patient began to have resuscitation fluids, patient received multiple boluses of adenosine with no appreciable change in his rhythm. Subsequently, the patient was started on a diltiazem drip with reduction in heart rate.  Given the patient's new arrhythmia, rapid ventricular response, unknown time of onset, patient was started on a heparin drip in addition to his antiarrhythmic.  Patient was admitted to the cardiology team for further evaluation and management.  CRITICAL CARE Performed by: Carmin Muskrat Total critical care time: 45 Critical care time was exclusive of separately billable procedures and treating other patients. Critical care was necessary to treat or  prevent imminent or life-threatening deterioration. Critical care was time spent personally by me on the following activities: development of treatment plan with patient and/or surrogate as well as nursing, discussions with consultants, evaluation of patient's response to treatment, examination of patient, obtaining history from patient or surrogate, ordering and performing treatments and interventions, ordering and review of laboratory studies, ordering and review of radiographic studies, pulse oximetry and re-evaluation of patient's condition.   Carmin Muskrat, MD 10/09/13 1500  Carmin Muskrat, MD 10/09/13 1520

## 2013-10-09 NOTE — Progress Notes (Signed)
ANTICOAGULATION CONSULT NOTE - Follow Up Consult  Pharmacy Consult for heparin Indication: atrial fibrillation  Allergies  Allergen Reactions  . Statins     Myalgias.  Can only take Altoprev     Patient Measurements: Height: 6' (182.9 cm) Weight: 250 lb (113.399 kg) IBW/kg (Calculated) : 77.6 Heparin Dosing Weight: 102 kg  Vital Signs: Temp: 97.7 F (36.5 C) (07/24 1922) Temp src: Oral (07/24 1922) BP: 91/61 mmHg (07/24 1922) Pulse Rate: 96 (07/24 1922)  Labs:  Recent Labs  10/09/13 1250 10/09/13 1500 10/09/13 1845 10/09/13 2106  HGB 18.5*  --   --   --   HCT 55.1*  --   --   --   PLT 265  --   --   --   APTT  --  99*  --   --   LABPROT  --  14.4  --   --   INR  --  1.12  --   --   HEPARINUNFRC  --   --   --  0.22*  CREATININE 1.05  --   --   --   TROPONINI <0.30  --  <0.30  --     Estimated Creatinine Clearance: 86.3 ml/min (by C-G formula based on Cr of 1.05).   Medications:  Infusions:  . diltiazem (CARDIZEM) infusion 15 mg/hr (10/09/13 1852)  . heparin 1,500 Units/hr (10/09/13 1434)    Assessment: 68 y/o male who presented to the ED on 7/24 with abdominal pain and diarrhea found to be in Afib. He continues on IV heparin.   Heparin level is SUBtherapeutic at 0.22 on 1500 units/hr. No bleeding noted.   Goal of Therapy:  Heparin level 0.3-0.7 units/ml Monitor platelets by anticoagulation protocol: Yes   Plan:  - Heparin 1500 units IV bolus then increase rate to 1700 units/hr - Daily heparin level and CBC - Monitor for s/sx of bleeding  [facility], [place].D., BCPS Clinical Pharmacist Pager: [phone] 10/09/2013 9:37 PM

## 2013-10-09 NOTE — Progress Notes (Signed)
ANTICOAGULATION CONSULT NOTE - Initial Consult  Pharmacy Consult for Heparin Indication: atrial fibrillation  Allergies  Allergen Reactions  . Statins     Myalgias     Patient Measurements: Height: 6' (182.9 cm) Weight: 250 lb (113.399 kg) IBW/kg (Calculated) : 77.6 Heparin Dosing Weight:   Vital Signs: Temp: 97.4 F (36.3 C) (07/24 1249) Temp src: Oral (07/24 1249) BP: 120/89 mmHg (07/24 1330) Pulse Rate: 52 (07/24 1330)  Labs:  Recent Labs  10/09/13 1250  HGB 18.5*  HCT 55.1*  PLT 265    CrCl is unknown because no creatinine reading has been taken.   Medical History: Past Medical History  Diagnosis Date  . Hypertension   . DJD (degenerative joint disease)     /osteoarthritis  . Frozen shoulder     right  . Chronic left hip pain   . Chronic low back pain     /sciatica- Dr Letta Median [facility])  . Asbestosis     mild  . GERD (gastroesophageal reflux disease)   . Hypogonadism male   . Testicular cancer 1972    left  . Coronary artery disease   . Left bundle branch block (LBBB) on electrocardiogram   . Obesity   . Factor V Leiden   . Diabetes mellitus 06/2011    Type 2.     Medications:  Scheduled:    Assessment: 68yo male presenting with AFib, s/p adenosine and currently on Diltiazem drip- to start Heparin.  No heparin has been given per d/w RN.  Baseline Hg 18.5, pltc wnl.  Per d/w patient, he has no history of bleeding problems and is on no blood thinners.  Goal of Therapy:  Heparin level 0.3-0.7 units/ml Monitor platelets by anticoagulation protocol: Yes   Plan:  1-  Heparin 4000 units IV x 1, 1500 units/hr 2-  Check heparin level 6hr 3-  Daily Heparin level and CBC  Gracy Bruins, [facility]

## 2013-10-09 NOTE — ED Notes (Signed)
Dr Ellyn Hack advised of Pt increased HR and he gave an order for a 10mg  Dilitezem.

## 2013-10-09 NOTE — ED Notes (Addendum)
HR increasing to 140's.  Cardizem increased to 15 mg/ml.  Dr Vanita Panda notified.

## 2013-10-09 NOTE — ED Notes (Signed)
Heparin bolus and heparin infusion started per MD order with Hassan Rowan RN as a witness.

## 2013-10-09 NOTE — ED Notes (Addendum)
Cardiology at bedside.  Dr Ellyn Hack.

## 2013-10-09 NOTE — ED Notes (Signed)
Pt stated he felt  since July 13 after flying back from [place].  Pt states he has had SOB and diarrhea week.  Abdominal pain x 3 days.  Generalized weekness.  Sund treated at [place] and discharged with zofran.  Diarrhea improved but continues to feel generalized weakness.

## 2013-10-09 NOTE — ED Notes (Signed)
Pt originally called ems for abdominal pain r/t diarrhea for several days.  Pt recently travelled by plane to [place].  HR 150 at ems arrival.  Given 500 ns given by ems.  Pt ao x 4.  HR 180.

## 2013-10-10 ENCOUNTER — Inpatient Hospital Stay (HOSPITAL_COMMUNITY): Payer: Medicare Other

## 2013-10-10 DIAGNOSIS — I517 Cardiomegaly: Secondary | ICD-10-CM

## 2013-10-10 LAB — HEMOGLOBIN A1C
Hgb A1c MFr Bld: 6.6 % — ABNORMAL HIGH (ref <5.7)
Mean Plasma Glucose: 143 mg/dL — ABNORMAL HIGH (ref <117)

## 2013-10-10 LAB — HEPARIN LEVEL (UNFRACTIONATED)
HEPARIN UNFRACTIONATED: 0.23 [IU]/mL — AB (ref 0.30–0.70)
Heparin Unfractionated: 0.28 IU/mL — ABNORMAL LOW (ref 0.30–0.70)
Heparin Unfractionated: 0.25 IU/mL — ABNORMAL LOW (ref 0.30–0.70)

## 2013-10-10 LAB — CBC
HCT: 50.2 % (ref 39.0–52.0)
HCT: 49.5 % (ref 39.0–52.0)
HEMOGLOBIN: 16.5 g/dL (ref 13.0–17.0)
Hemoglobin: 16.5 g/dL (ref 13.0–17.0)
MCH: 32.3 pg (ref 26.0–34.0)
MCH: 32.5 pg (ref 26.0–34.0)
MCHC: 32.9 g/dL (ref 30.0–36.0)
MCHC: 33.3 g/dL (ref 30.0–36.0)
MCV: 98.2 fL (ref 78.0–100.0)
MCV: 97.4 fL (ref 78.0–100.0)
PLATELETS: 232 10*3/uL (ref 150–400)
PLATELETS: 219 10*3/uL (ref 150–400)
RBC: 5.11 MIL/uL (ref 4.22–5.81)
RBC: 5.08 MIL/uL (ref 4.22–5.81)
RDW: 14.9 % (ref 11.5–15.5)
RDW: 14.9 % (ref 11.5–15.5)
WBC: 7.7 10*3/uL (ref 4.0–10.5)
WBC: 7.5 10*3/uL (ref 4.0–10.5)

## 2013-10-10 LAB — GLUCOSE, CAPILLARY
GLUCOSE-CAPILLARY: 135 mg/dL — AB (ref 70–99)
GLUCOSE-CAPILLARY: 139 mg/dL — AB (ref 70–99)
GLUCOSE-CAPILLARY: 145 mg/dL — AB (ref 70–99)
Glucose-Capillary: 133 mg/dL — ABNORMAL HIGH (ref 70–99)
Glucose-Capillary: 99 mg/dL (ref 70–99)

## 2013-10-10 LAB — BASIC METABOLIC PANEL
ANION GAP: 14 (ref 5–15)
ANION GAP: 14 (ref 5–15)
BUN: 23 mg/dL (ref 6–23)
BUN: 20 mg/dL (ref 6–23)
CALCIUM: 8.7 mg/dL (ref 8.4–10.5)
CHLORIDE: 100 meq/L (ref 96–112)
CO2: 24 mEq/L (ref 19–32)
CO2: 23 mEq/L (ref 19–32)
Calcium: 8.7 mg/dL (ref 8.4–10.5)
Chloride: 100 mEq/L (ref 96–112)
Creatinine, Ser: 0.85 mg/dL (ref 0.50–1.35)
Creatinine, Ser: 0.95 mg/dL (ref 0.50–1.35)
GFR calc Af Amer: >90 mL/min (ref >90)
GFR calc Af Amer: >90 mL/min (ref >90)
GFR calc non Af Amer: 87 mL/min — ABNORMAL LOW (ref >90)
GFR calc non Af Amer: 83 mL/min — ABNORMAL LOW (ref >90)
GLUCOSE: 123 mg/dL — AB (ref 70–99)
Glucose, Bld: 116 mg/dL — ABNORMAL HIGH (ref 70–99)
POTASSIUM: 3.7 meq/L (ref 3.7–5.3)
Potassium: 3.8 mEq/L (ref 3.7–5.3)
SODIUM: 138 meq/L (ref 137–147)
Sodium: 137 mEq/L (ref 137–147)

## 2013-10-10 LAB — TROPONIN I
Troponin I: <0.3 ng/mL (ref <0.30)
Troponin I: <0.3 ng/mL (ref <0.30)

## 2013-10-10 LAB — T4, FREE: Free T4: 1.35 ng/dL (ref 0.80–1.80)

## 2013-10-10 MED ORDER — ONDANSETRON HCL 4 MG/2ML IJ SOLN
INTRAMUSCULAR | Status: AC
  Filled 2013-10-10: qty 2

## 2013-10-10 MED ORDER — REGADENOSON 0.4 MG/5ML IV SOLN
INTRAVENOUS | Status: AC
  Administered 2013-10-10: 0.4 mg via INTRAVENOUS
  Filled 2013-10-10: qty 5

## 2013-10-10 MED ORDER — TECHNETIUM TC 99M SESTAMIBI - CARDIOLITE
30.0000 | Freq: Once | INTRAVENOUS | Status: AC | PRN
  Administered 2013-10-10: 30 via INTRAVENOUS

## 2013-10-10 MED ORDER — REGADENOSON 0.4 MG/5ML IV SOLN
0.4000 mg | Freq: Once | INTRAVENOUS | Status: AC
  Administered 2013-10-10: 0.4 mg via INTRAVENOUS

## 2013-10-10 MED ORDER — TECHNETIUM TC 99M SESTAMIBI GENERIC - CARDIOLITE
10.0000 | Freq: Once | INTRAVENOUS | Status: AC | PRN
  Administered 2013-10-10: 10 via INTRAVENOUS

## 2013-10-10 MED ORDER — HEPARIN BOLUS VIA INFUSION
1500.0000 [IU] | Freq: Once | INTRAVENOUS | Status: AC
  Administered 2013-10-10: 1500 [IU] via INTRAVENOUS
  Filled 2013-10-10: qty 1500

## 2013-10-10 MED ORDER — LISINOPRIL 10 MG PO TABS
10.0000 mg | ORAL_TABLET | Freq: Every day | ORAL | Status: DC
  Administered 2013-10-10 – 2013-10-19 (×9): 10 mg via ORAL
  Filled 2013-10-10 (×10): qty 1

## 2013-10-10 NOTE — Progress Notes (Signed)
  Echocardiogram 2D Echocardiogram has been performed.  Mako Pelfrey FRANCES 10/10/2013, 5:23 PM

## 2013-10-10 NOTE — Progress Notes (Signed)
ANTICOAGULATION CONSULT NOTE - Follow Up Consult  Pharmacy Consult for heparin Indication: atrial fibrillation  Allergies  Allergen Reactions  . Statins     Myalgias.  Can only take Altoprev     Patient Measurements: Height: 6' (182.9 cm) Weight: 256 lb 8 oz (116.348 kg) IBW/kg (Calculated) : 77.6 Heparin Dosing Weight: 102 kg  Vital Signs: Temp: 98.5 F (36.9 C) (07/25 0415) Temp src: Oral (07/25 0415) BP: 102/60 mmHg (07/25 1133) Pulse Rate: 86 (07/25 0945)  Labs:  Recent Labs  10/09/13 1250 10/09/13 1500 10/09/13 1845 10/09/13 2106 10/10/13 0033 10/10/13 0403 10/10/13 0640 10/10/13 1342  HGB 18.5*  --   --   --   --  16.5  --   --   HCT 55.1*  --   --   --   --  50.2  --   --   PLT 265  --   --   --   --  232  --   --   APTT  --  99*  --   --   --   --   --   --   LABPROT  --  14.4  --   --   --   --   --   --   INR  --  1.12  --   --   --   --   --   --   HEPARINUNFRC  --   --   --  0.22*  --  0.28*  --  0.25*  CREATININE 1.05  --   --   --   --  0.85  --   --   TROPONINI <0.30  --  <0.30  --  <0.30  --  <0.30  --     Estimated Creatinine Clearance: 108 ml/min (by C-G formula based on Cr of 0.85).  Assessment: Patient is a 68 y.o M on heparin for afib with RVR.  Heparin level remains sub-therapeutic despite rate increased to 1900 units/hr this morning. Per RN, no issues with IV line and bleeding noted.  Goal of Therapy:  Heparin level 0.3-0.7 units/ml Monitor platelets by anticoagulation protocol: Yes   Plan:  1) heparin 1500 units IV bolus, then increase rate to 2100 units/hr 2) check 6 hour heparin level 3) f/u plan for oral AC  Talise Sligh P 10/10/2013,2:18 PM

## 2013-10-10 NOTE — Progress Notes (Signed)
ANTICOAGULATION CONSULT NOTE - Follow Up Consult  Pharmacy Consult for Heparin  Indication: atrial fibrillation  Allergies  Allergen Reactions  . Statins     Myalgias.  Can only take Altoprev    Patient Measurements: Height: 6' (182.9 cm) Weight: 256 lb 8 oz (116.348 kg) IBW/kg (Calculated) : 77.6 Heparin Dosing Weight: 102 kg  Vital Signs: Temp: 98.5 F (36.9 C) (07/25 0415) Temp src: Oral (07/25 0415) BP: 108/76 mmHg (07/25 0415) Pulse Rate: 67 (07/25 0415)  Labs:  Recent Labs  10/09/13 1250 10/09/13 1500 10/09/13 1845 10/09/13 2106 10/10/13 0033 10/10/13 0403  HGB 18.5*  --   --   --   --  16.5  HCT 55.1*  --   --   --   --  50.2  PLT 265  --   --   --   --  232  APTT  --  99*  --   --   --   --   LABPROT  --  14.4  --   --   --   --   INR  --  1.12  --   --   --   --   HEPARINUNFRC  --   --   --  0.22*  --  0.28*  CREATININE 1.05  --   --   --   --  0.85  TROPONINI <0.30  --  <0.30  --  <0.30  --    Medications: Heparin 1700 units/hr  Assessment: 68 y/o M on heparin for afib. HL is 0.28 despite rate increase, other labs as above.   Goal of Therapy:  Heparin level 0.3-0.7 units/ml Monitor platelets by anticoagulation protocol: Yes   Plan:  -Increase heparin to 1900 units/hr -1400 HL -Daily CBC/HL -Monitor for bleeding  Narda Bonds 10/10/2013,6:17 AM

## 2013-10-10 NOTE — Progress Notes (Signed)
ANTICOAGULATION CONSULT NOTE - Follow Up Consult  Pharmacy Consult for Heparin  Indication: atrial fibrillation  Allergies  Allergen Reactions  . Statins     Myalgias.  Can only take Altoprev    Patient Measurements: Height: 6' (182.9 cm) Weight: 256 lb 8 oz (116.348 kg) IBW/kg (Calculated) : 77.6 Heparin Dosing Weight: 102 kg  Vital Signs: Temp: 97.7 F (36.5 C) (07/25 2045) Temp src: Oral (07/25 2045) BP: 94/61 mmHg (07/25 2045) Pulse Rate: 86 (07/25 2045)  Labs:  Recent Labs  10/09/13 1250 10/09/13 1500 10/09/13 1845  10/10/13 0033 10/10/13 0403 10/10/13 0640 10/10/13 1342 10/10/13 2238  HGB 18.5*  --   --   --   --  16.5  --   --  16.5  HCT 55.1*  --   --   --   --  50.2  --   --  49.5  PLT 265  --   --   --   --  232  --   --  219  APTT  --  99*  --   --   --   --   --   --   --   LABPROT  --  14.4  --   --   --   --   --   --   --   INR  --  1.12  --   --   --   --   --   --   --   HEPARINUNFRC  --   --   --   < >  --  0.28*  --  0.25* 0.23*  CREATININE 1.05  --   --   --   --  0.85  --   --  0.95  TROPONINI <0.30  --  <0.30  --  <0.30  --  <0.30  --   --   < > = values in this interval not displayed.  Medications: Heparin 2100 units/hr  Assessment: 68 y/o M on heparin for afib. HL is 0.23 despite rate increase, other labs as above.   Goal of Therapy:  Heparin level 0.3-0.7 units/ml Monitor platelets by anticoagulation protocol: Yes   Plan:  -Increase heparin to 2350 units/hr -HL with AM labs -Daily CBC/HL -Monitor for bleeding  Narda Bonds 10/10/2013,11:32 PM

## 2013-10-10 NOTE — Progress Notes (Signed)
Primary cardiologist: Dr. Loralie Champagne  Subjective:   Patient already taken downstairs for Myoview. I spoke with his wife in the room, updated her on plan for testing and medical therapy options. Physician assistant to assess patient clinically at [facility]   Objective:   Temp:  [97.4 F (36.3 C)-98.5 F (36.9 C)] 98.5 F (36.9 C) (07/25 0415) Pulse Rate:  [25-128] 67 (07/25 0415) Resp:  [7-29] 19 (07/25 0415) BP: (91-143)/(61-124) 108/76 mmHg (07/25 0415) SpO2:  [91 %-100 %] 96 % (07/25 0415) Weight:  [250 lb (113.399 kg)-256 lb 8 oz (116.348 kg)] 256 lb 8 oz (116.348 kg) (07/25 0415) Last BM Date: 10/09/13  Filed Weights   10/09/13 1300 10/09/13 1329 10/10/13 0415  Weight: 250 lb (113.399 kg) 250 lb (113.399 kg) 256 lb 8 oz (116.348 kg)    Intake/Output Summary (Last 24 hours) at 10/10/13 0815 Last data filed at 10/10/13 0755  Gross per 24 hour  Intake    240 ml  Output    850 ml  Net   -610 ml    Telemetry: Atrial fibrillation with IVCD.   Lab Results:  Basic Metabolic Panel:  Recent Labs Lab 10/09/13 1250 10/10/13 0403  NA 142 138  K 4.5 3.7  CL 98 100  CO2 29 24  GLUCOSE 124* 123*  BUN 25* 23  CREATININE 1.05 0.85  CALCIUM 8.9 8.7    Liver Function Tests:  Recent Labs Lab 10/09/13 1250  AST 34  ALT 65*  ALKPHOS 51  BILITOT 1.2  PROT 6.4  ALBUMIN 3.9    CBC:  Recent Labs Lab 10/09/13 1250 10/10/13 0403  WBC 10.0 7.7  HGB 18.5* 16.5  HCT 55.1* 50.2  MCV 98.6 98.2  PLT 265 232    Cardiac Enzymes:  Recent Labs Lab 10/09/13 1845 10/10/13 0033 10/10/13 0640  TROPONINI <0.30 <0.30 <0.30    BNP:  Recent Labs  10/09/13 1845  PROBNP 1484.0*    Coagulation:  Recent Labs Lab 10/09/13 1500  INR 1.12    ECG: Atrial fibrillation with left bundle branch block.   Medications:   Scheduled Medications: . aspirin  81 mg Oral BID  . celecoxib  200 mg Oral Daily  . cholecalciferol  1,000 Units Oral Daily  . diltiazem   20 mg Intravenous Once  . hydrochlorothiazide  25 mg Oral Q breakfast  . insulin aspart  0-15 Units Subcutaneous TID WC  . lisinopril  10 mg Oral Daily  . [START ON 10/12/2013] metFORMIN  500 mg Oral BID WC  . omega-3 acid ethyl esters  1 g Oral BID  . pantoprazole  40 mg Oral Daily  . rOPINIRole  1 mg Oral QHS     Infusions: . diltiazem (CARDIZEM) infusion 15 mg/hr (10/10/13 0810)  . heparin 1,900 Units/hr (10/10/13 0653)     PRN Medications:  acetaminophen, albuterol, HYDROcodone-acetaminophen, ondansetron (ZOFRAN) IV, rOPINIRole   Assessment:   1. Atrial fibrillation with RVR, presumably recent onset with the last 2 weeks, but absolute duration uncertain. Cardiac markers argue against ACS. He is on Cardizem and heparin infusions. Will probably focus on rate control and anticoagulation, deferring potential cardioversion more electively.  2. Chronic left bundle-branch block.  3. History of reported nonischemic cardiomyopathy with nonobstructive CAD documented at outside heart catheterization 2011. Patient scheduled for followup Myoview today by Dr. Ellyn Hack. Echocardiogram from March of this year showed LVEF 50-55%, followup study pending.  4. Reported history of factor V Leiden mutation.  5. Hypertension.  6. Type 2 diabetes mellitus.  7. Hyperlipidemia.   Plan/Discussion:    Followup studies pending including Myoview and echocardiogram to help to guide further management of his atrial fibrillation. CHADSVASC score is 4, and will need to transition to an oral anticoagulant pending above studies. Will probably go with Coumadin considering all issues including factor V Leiden mutation. Further plans to follow.   Satira Sark, M.D., F.A.C.C.

## 2013-10-11 DIAGNOSIS — R9430 Abnormal result of cardiovascular function study, unspecified: Secondary | ICD-10-CM

## 2013-10-11 LAB — GLUCOSE, CAPILLARY
GLUCOSE-CAPILLARY: 147 mg/dL — AB (ref 70–99)
Glucose-Capillary: 131 mg/dL — ABNORMAL HIGH (ref 70–99)
Glucose-Capillary: 110 mg/dL — ABNORMAL HIGH (ref 70–99)
Glucose-Capillary: 134 mg/dL — ABNORMAL HIGH (ref 70–99)

## 2013-10-11 LAB — CBC
HCT: 47.7 % (ref 39.0–52.0)
HEMOGLOBIN: 15.8 g/dL (ref 13.0–17.0)
MCH: 32.4 pg (ref 26.0–34.0)
MCHC: 33.1 g/dL (ref 30.0–36.0)
MCV: 97.7 fL (ref 78.0–100.0)
PLATELETS: 212 10*3/uL (ref 150–400)
RBC: 4.88 MIL/uL (ref 4.22–5.81)
RDW: 14.9 % (ref 11.5–15.5)
WBC: 7.7 10*3/uL (ref 4.0–10.5)

## 2013-10-11 LAB — HEPARIN LEVEL (UNFRACTIONATED)
HEPARIN UNFRACTIONATED: 0.2 [IU]/mL — AB (ref 0.30–0.70)
Heparin Unfractionated: 0.47 IU/mL (ref 0.30–0.70)

## 2013-10-11 MED ORDER — OFF THE BEAT BOOK
Freq: Once | Status: AC
  Administered 2013-10-11: 1
  Filled 2013-10-11: qty 1

## 2013-10-11 MED ORDER — SODIUM CHLORIDE 0.9 % IJ SOLN: 3.0000 mL | Freq: Two times a day (BID) | INTRAMUSCULAR | Status: DC

## 2013-10-11 MED ORDER — SODIUM CHLORIDE 0.9 % IV SOLN: 250.0000 mL | INTRAVENOUS | Status: DC | PRN

## 2013-10-11 MED ORDER — SODIUM CHLORIDE 0.9 % IJ SOLN: 3.0000 mL | INTRAMUSCULAR | Status: DC | PRN

## 2013-10-11 MED ORDER — SODIUM CHLORIDE 0.9 % IV SOLN
INTRAVENOUS | Status: DC
  Administered 2013-10-12: 10 mL via INTRAVENOUS
  Administered 2013-10-15 – 2013-10-16 (×2): via INTRAVENOUS

## 2013-10-11 MED ORDER — HEPARIN BOLUS VIA INFUSION
1500.0000 [IU] | Freq: Once | INTRAVENOUS | Status: AC
  Administered 2013-10-11: 1500 [IU] via INTRAVENOUS
  Filled 2013-10-11: qty 1500

## 2013-10-11 NOTE — Progress Notes (Addendum)
Primary cardiologist: Dr. Loralie Champagne  Subjective:   In bedside chair. Feels better in terms of shortness of breath. No chest pain.   Objective:   Temp:  [97.7 F (36.5 C)-98 F (36.7 C)] 98 F (36.7 C) (07/26 0504) Pulse Rate:  [79-139] 79 (07/26 0504) Resp:  [17-20] 20 (07/26 0504) BP: (94-132)/(60-90) 132/75 mmHg (07/26 0504) SpO2:  [94 %-95 %] 95 % (07/26 0504) Weight:  [255 lb 14.4 oz (116.075 kg)] 255 lb 14.4 oz (116.075 kg) (07/26 0504) Last BM Date: 10/09/13  Filed Weights   10/09/13 1329 10/10/13 0415 10/11/13 0504  Weight: 250 lb (113.399 kg) 256 lb 8 oz (116.348 kg) 255 lb 14.4 oz (116.075 kg)    Intake/Output Summary (Last 24 hours) at 10/11/13 0719 Last data filed at 10/11/13 0505  Gross per 24 hour  Intake    480 ml  Output   1675 ml  Net  -1195 ml    Telemetry: Atrial fibrillation with IVCD and aberrantly conducted complexes versus PVCs.  Lab Results:  Basic Metabolic Panel:  Recent Labs Lab 10/09/13 1250 10/10/13 0403 10/10/13 2238  NA 142 138 137  K 4.5 3.7 3.8  CL 98 100 100  CO2 29 24 23   GLUCOSE 124* 123* 116*  BUN 25* 23 20  CREATININE 1.05 0.85 0.95  CALCIUM 8.9 8.7 8.7    Liver Function Tests:  Recent Labs Lab 10/09/13 1250  AST 34  ALT 65*  ALKPHOS 51  BILITOT 1.2  PROT 6.4  ALBUMIN 3.9    CBC:  Recent Labs Lab 10/10/13 0403 10/10/13 2238 10/11/13 0455  WBC 7.7 7.5 7.7  HGB 16.5 16.5 15.8  HCT 50.2 49.5 47.7  MCV 98.2 97.4 97.7  PLT 232 219 212    Cardiac Enzymes:  Recent Labs Lab 10/09/13 1845 10/10/13 0033 10/10/13 0640  TROPONINI <0.30 <0.30 <0.30    BNP:  Recent Labs  10/09/13 1845  PROBNP 1484.0*    Coagulation:  Recent Labs Lab 10/09/13 1500  INR 1.12    Lexiscan Myoview 10/10/2013: CLINICAL DATA: 68 year old with current history of nonischemic  cardiomyopathy and previous nonobstructive coronary artery disease  on outside heart catheterization in 2011, presenting with  recent  onset of atrial fibrillation and rapid ventricular response.  EXAM:  MYOCARDIAL IMAGING WITH SPECT (REST AND PHARMACOLOGIC-STRESS)  GATED LEFT VENTRICULAR WALL MOTION STUDY  LEFT VENTRICULAR EJECTION FRACTION  TECHNIQUE:  Standard myocardial SPECT imaging was performed after resting  intravenous injection of 10 mCi Tc-5m sestamibi. Subsequently,  intravenous infusion of Lexiscan was performed under the supervision  of the Cardiology staff. At peak effect of the drug, 30 mCi Tc-67m  sestamibi was injected intravenously and standard myocardial SPECT  imaging was performed. Quantitative gated imaging was also performed  to evaluate left ventricular wall motion, and estimate left  ventricular ejection fraction.  COMPARISON: None.  FINDINGS:  Immediate post regadenoson images demonstrate diminished activity in  the inferior wall, apex, and inferolateral wall. Activity in the  anterior wall is normal. Initial resting images demonstrate similar  findings. No evidence of reversibility to suggest ischemia. Findings  confirmed by the computer generated polar map.  Gated images demonstrate severe global hypokinesis with essential  akinesis in the inferior wall and apex.  Estimated QGS left ventricular ejection fraction measured 18%, with  an end-diastolic volume of 765 ml and an end systolic volume of 465  ml.  IMPRESSION:  1. Inferior wall, apical and inferolateral wall MI. No evidence of  myocardial ischemia.  2. Severe global hypokinesis with a central akinesis in the inferior  wall and apex.  3. Estimated QGS ejection fraction 18%.  4. Left ventricular enlargement.   Medications:   Scheduled Medications: . aspirin  81 mg Oral BID  . celecoxib  200 mg Oral Daily  . cholecalciferol  1,000 Units Oral Daily  . diltiazem  20 mg Intravenous Once  . heparin  1,500 Units Intravenous Once  . hydrochlorothiazide  25 mg Oral Q breakfast  . insulin aspart  0-15 Units Subcutaneous  TID WC  . lisinopril  10 mg Oral Daily  . [START ON 10/12/2013] metFORMIN  500 mg Oral BID WC  . omega-3 acid ethyl esters  1 g Oral BID  . pantoprazole  40 mg Oral Daily  . rOPINIRole  1 mg Oral QHS    Infusions: . diltiazem (CARDIZEM) infusion 15 mg/hr (10/11/13 0421)  . heparin 2,350 Units/hr (10/10/13 2345)    PRN Medications: acetaminophen, albuterol, HYDROcodone-acetaminophen, ondansetron (ZOFRAN) IV, rOPINIRole   Assessment:   1. Atrial fibrillation with RVR, presumably recent onset with the last 2 weeks, but absolute duration uncertain. Cardiac markers argue against ACS. He is on Cardizem and heparin infusions for now.  2. Chronic left bundle-branch block.  3. History of reported nonischemic cardiomyopathy with nonobstructive CAD documented at outside heart catheterization 2011 ([place]). Myoview from yesterday shows apical anteroseptal and inferolateral scar versus attenuation with calculated LVEF of 18%. Followup echocardiogram results pending.  4. Reported history of factor V Leiden mutation.  5. Hypertension.  6. Type 2 diabetes mellitus.  7. Hyperlipidemia.   Plan/Discussion:    Plan to followup on echocardiogram today. If there has been a reduction in LVEF, particularly in light of Myoview results, will likely proceed with a right and left heart catheterization tomorrow. It will be important to reassess coronary anatomy prior to picking anticoagulation course for atrial fibrillation and deciding about cardioversion, although he may well have a tachycardia-mediated cardiomyopathy. CHADSVASC score is 4, and will need to transition to an oral anticoagulant pending above studies. Need to consider factor V Leiden mutation as well. Further plans to follow.   Satira Sark, M.D., F.A.C.C.

## 2013-10-11 NOTE — Progress Notes (Signed)
ANTICOAGULATION CONSULT NOTE - Follow Up Consult  Pharmacy Consult for Heparin  Indication: atrial fibrillation  Allergies  Allergen Reactions  . Statins     Myalgias.  Can only take Altoprev    Patient Measurements: Height: 6' (182.9 cm) Weight: 255 lb 14.4 oz (116.075 kg) IBW/kg (Calculated) : 77.6 Heparin Dosing Weight: 102 kg  Vital Signs: Temp: 98 F (36.7 C) (07/26 0504) Temp src: Oral (07/26 0504) BP: 132/75 mmHg (07/26 0504) Pulse Rate: 79 (07/26 0504)  Labs:  Recent Labs  10/09/13 1250 10/09/13 1500 10/09/13 1845  10/10/13 0033 10/10/13 0403 10/10/13 0640 10/10/13 1342 10/10/13 2238 10/11/13 0455  HGB 18.5*  --   --   --   --  16.5  --   --  16.5 15.8  HCT 55.1*  --   --   --   --  50.2  --   --  49.5 47.7  PLT 265  --   --   --   --  232  --   --  219 212  APTT  --  99*  --   --   --   --   --   --   --   --   LABPROT  --  14.4  --   --   --   --   --   --   --   --   INR  --  1.12  --   --   --   --   --   --   --   --   HEPARINUNFRC  --   --   --   < >  --  0.28*  --  0.25* 0.23* 0.20*  CREATININE 1.05  --   --   --   --  0.85  --   --  0.95  --   TROPONINI <0.30  --  <0.30  --  <0.30  --  <0.30  --   --   --   < > = values in this interval not displayed.  Medications: Heparin 2350 units/hr  Assessment: 68 y/o M on heparin for afib. HL is 0.2 despite rate increase, other labs as above.   Goal of Therapy:  Heparin level 0.3-0.7 units/ml Monitor platelets by anticoagulation protocol: Yes   Plan:  -Heparin 1500 units BOLUS -Increase heparin to 2600 units/hr -1400 HL -Daily CBC/HL -Monitor for bleeding  Narda Bonds 10/11/2013,7:01 AM

## 2013-10-11 NOTE — Progress Notes (Signed)
ANTICOAGULATION CONSULT NOTE - Follow Up Consult  Pharmacy Consult for heparin Indication: atrial fibrillation  Allergies  Allergen Reactions  . Statins     Myalgias.  Can only take Altoprev     Patient Measurements: Height: 6' (182.9 cm) Weight: 255 lb 14.4 oz (116.075 kg) IBW/kg (Calculated) : 77.6 Heparin Dosing Weight: 102 kg  Vital Signs: Temp: 97.7 F (36.5 C) (07/26 1432) Temp src: Oral (07/26 1432) BP: 103/68 mmHg (07/26 1432) Pulse Rate: 102 (07/26 1432)  Labs:  Recent Labs  10/09/13 1250 10/09/13 1500 10/09/13 1845  10/10/13 0033 10/10/13 0403 10/10/13 0640  10/10/13 2238 10/11/13 0455 10/11/13 1355  HGB 18.5*  --   --   --   --  16.5  --   --  16.5 15.8  --   HCT 55.1*  --   --   --   --  50.2  --   --  49.5 47.7  --   PLT 265  --   --   --   --  232  --   --  219 212  --   APTT  --  99*  --   --   --   --   --   --   --   --   --   LABPROT  --  14.4  --   --   --   --   --   --   --   --   --   INR  --  1.12  --   --   --   --   --   --   --   --   --   HEPARINUNFRC  --   --   --   < >  --  0.28*  --   < > 0.23* 0.20* 0.47  CREATININE 1.05  --   --   --   --  0.85  --   --  0.95  --   --   TROPONINI <0.30  --  <0.30  --  <0.30  --  <0.30  --   --   --   --   < > = values in this interval not displayed.  Estimated Creatinine Clearance: 96.5 ml/min (by C-G formula based on Cr of 0.95).  Assessment: Patient is a 68 y.o M on heparin for afib with RVR with plan for possible left and right heart cath on 7/27.  Heparin level now back therapeutic at 0.47 after rate increased to 2600 units/hr this morning.  No bleeding documented.  Goal of Therapy:  Heparin level 0.3-0.7 units/ml Monitor platelets by anticoagulation protocol: Yes   Plan:  1) continue heparin drip at 2600 units/hr 2) f/u with plan for oral AC after cath  Krystal Delduca P 10/11/2013,2:40 PM

## 2013-10-12 ENCOUNTER — Encounter (HOSPITAL_COMMUNITY)
Admission: EM | Disposition: A | Discharge status: Home / Self Care | Payer: Self-pay | Source: Home / Self Care | Attending: Cardiology

## 2013-10-12 DIAGNOSIS — E119 Type 2 diabetes mellitus without complications: Secondary | ICD-10-CM

## 2013-10-12 DIAGNOSIS — R9430 Abnormal result of cardiovascular function study, unspecified: Secondary | ICD-10-CM

## 2013-10-12 DIAGNOSIS — I447 Left bundle-branch block, unspecified: Secondary | ICD-10-CM

## 2013-10-12 DIAGNOSIS — E785 Hyperlipidemia, unspecified: Secondary | ICD-10-CM

## 2013-10-12 DIAGNOSIS — I428 Other cardiomyopathies: Secondary | ICD-10-CM

## 2013-10-12 HISTORY — PX: LEFT AND RIGHT HEART CATHETERIZATION WITH CORONARY ANGIOGRAM: SHX5449

## 2013-10-12 LAB — POCT I-STAT 3, VENOUS BLOOD GAS (G3P V)
Acid-Base Excess: 3 mmol/L — ABNORMAL HIGH (ref 0.0–2.0)
Bicarbonate: 27.7 mEq/L — ABNORMAL HIGH (ref 20.0–24.0)
O2 SAT: 70 %
PCO2 VEN: 42.5 mmHg — AB (ref 45.0–50.0)
PO2 VEN: 36 mmHg (ref 30.0–45.0)
TCO2: 29 mmol/L (ref 0–100)
pH, Ven: 7.422 — ABNORMAL HIGH (ref 7.250–7.300)

## 2013-10-12 LAB — CBC
HCT: 48 % (ref 39.0–52.0)
HCT: 49.7 % (ref 39.0–52.0)
HEMOGLOBIN: 15.9 g/dL (ref 13.0–17.0)
HEMOGLOBIN: 16.4 g/dL (ref 13.0–17.0)
MCH: 32.2 pg (ref 26.0–34.0)
MCH: 32.3 pg (ref 26.0–34.0)
MCHC: 33.1 g/dL (ref 30.0–36.0)
MCHC: 33 g/dL (ref 30.0–36.0)
MCV: 97.2 fL (ref 78.0–100.0)
MCV: 97.8 fL (ref 78.0–100.0)
PLATELETS: 206 10*3/uL (ref 150–400)
Platelets: 202 10*3/uL (ref 150–400)
RBC: 4.94 MIL/uL (ref 4.22–5.81)
RBC: 5.08 MIL/uL (ref 4.22–5.81)
RDW: 14.9 % (ref 11.5–15.5)
RDW: 15 % (ref 11.5–15.5)
WBC: 7.4 10*3/uL (ref 4.0–10.5)
WBC: 7.9 10*3/uL (ref 4.0–10.5)

## 2013-10-12 LAB — BASIC METABOLIC PANEL
Anion gap: 15 (ref 5–15)
BUN: 12 mg/dL (ref 6–23)
CHLORIDE: 98 meq/L (ref 96–112)
CO2: 26 mEq/L (ref 19–32)
Calcium: 8.7 mg/dL (ref 8.4–10.5)
Creatinine, Ser: 1.02 mg/dL (ref 0.50–1.35)
GFR, EST AFRICAN AMERICAN: 85 mL/min — AB (ref >90)
GFR, EST NON AFRICAN AMERICAN: 73 mL/min — AB (ref >90)
Glucose, Bld: 132 mg/dL — ABNORMAL HIGH (ref 70–99)
Potassium: 3.5 mEq/L — ABNORMAL LOW (ref 3.7–5.3)
SODIUM: 139 meq/L (ref 137–147)

## 2013-10-12 LAB — GLUCOSE, CAPILLARY
Glucose-Capillary: 138 mg/dL — ABNORMAL HIGH (ref 70–99)
Glucose-Capillary: 141 mg/dL — ABNORMAL HIGH (ref 70–99)
Glucose-Capillary: 142 mg/dL — ABNORMAL HIGH (ref 70–99)
Glucose-Capillary: 98 mg/dL (ref 70–99)

## 2013-10-12 LAB — POCT I-STAT 3, ART BLOOD GAS (G3+)
Acid-Base Excess: 1 mmol/L (ref 0.0–2.0)
BICARBONATE: 24.9 meq/L — AB (ref 20.0–24.0)
O2 Saturation: 95 %
TCO2: 26 mmol/L (ref 0–100)
pCO2 arterial: 37.4 mmHg (ref 35.0–45.0)
pH, Arterial: 7.432 (ref 7.350–7.450)
pO2, Arterial: 75 mmHg — ABNORMAL LOW (ref 80.0–100.0)

## 2013-10-12 LAB — HEPARIN LEVEL (UNFRACTIONATED): Heparin Unfractionated: 0.97 IU/mL — ABNORMAL HIGH (ref 0.30–0.70)

## 2013-10-12 LAB — MAGNESIUM: Magnesium: 1.6 mg/dL (ref 1.5–2.5)

## 2013-10-12 LAB — MRSA PCR SCREENING: MRSA by PCR: NEGATIVE

## 2013-10-12 LAB — POCT ACTIVATED CLOTTING TIME: ACTIVATED CLOTTING TIME: 152 s

## 2013-10-12 SURGERY — LEFT AND RIGHT HEART CATHETERIZATION WITH CORONARY ANGIOGRAM
Anesthesia: LOCAL

## 2013-10-12 MED ORDER — HEPARIN (PORCINE) IN NACL 2-0.9 UNIT/ML-% IJ SOLN
INTRAMUSCULAR | Status: AC
  Filled 2013-10-12: qty 1000

## 2013-10-12 MED ORDER — ONDANSETRON HCL 4 MG/2ML IJ SOLN: 4.0000 mg | Freq: Four times a day (QID) | INTRAMUSCULAR | Status: DC | PRN

## 2013-10-12 MED ORDER — AMIODARONE HCL 150 MG/3ML IV SOLN
INTRAVENOUS | Status: AC
  Filled 2013-10-12: qty 6

## 2013-10-12 MED ORDER — FENTANYL CITRATE 0.05 MG/ML IJ SOLN
INTRAMUSCULAR | Status: AC
  Filled 2013-10-12: qty 2

## 2013-10-12 MED ORDER — FUROSEMIDE 10 MG/ML IJ SOLN
INTRAMUSCULAR | Status: AC
  Filled 2013-10-12: qty 4

## 2013-10-12 MED ORDER — POTASSIUM CHLORIDE CRYS ER 20 MEQ PO TBCR
40.0000 meq | EXTENDED_RELEASE_TABLET | Freq: Once | ORAL | Status: AC
  Administered 2013-10-12: 40 meq via ORAL
  Filled 2013-10-12: qty 2

## 2013-10-12 MED ORDER — AMIODARONE HCL IN DEXTROSE 360-4.14 MG/200ML-% IV SOLN: 30.0000 mg/h | INTRAVENOUS | Status: DC

## 2013-10-12 MED ORDER — ACETAMINOPHEN 325 MG PO TABS: 650.0000 mg | ORAL_TABLET | ORAL | Status: DC | PRN

## 2013-10-12 MED ORDER — HEPARIN (PORCINE) IN NACL 100-0.45 UNIT/ML-% IJ SOLN
1700.0000 [IU]/h | INTRAMUSCULAR | Status: DC
  Administered 2013-10-12: 2300 [IU]/h via INTRAVENOUS
  Administered 2013-10-13: 2100 [IU]/h via INTRAVENOUS
  Administered 2013-10-14: 2000 [IU]/h via INTRAVENOUS
  Administered 2013-10-14: 1800 [IU]/h via INTRAVENOUS
  Administered 2013-10-14: 2100 [IU]/h via INTRAVENOUS
  Administered 2013-10-16: 1700 [IU]/h via INTRAVENOUS
  Filled 2013-10-12 (×13): qty 250

## 2013-10-12 MED ORDER — AMIODARONE HCL IN DEXTROSE 360-4.14 MG/200ML-% IV SOLN
60.0000 mg/h | INTRAVENOUS | Status: AC
  Administered 2013-10-12: 60 mg/h via INTRAVENOUS
  Filled 2013-10-12: qty 200

## 2013-10-12 MED ORDER — MORPHINE SULFATE 10 MG/ML IJ SOLN
INTRAMUSCULAR | Status: AC
  Filled 2013-10-12: qty 1

## 2013-10-12 MED ORDER — LIDOCAINE HCL (PF) 1 % IJ SOLN
INTRAMUSCULAR | Status: AC
  Filled 2013-10-12: qty 30

## 2013-10-12 MED ORDER — AMIODARONE HCL IN DEXTROSE 360-4.14 MG/200ML-% IV SOLN
30.0000 mg/h | INTRAVENOUS | Status: DC
  Administered 2013-10-12 – 2013-10-16 (×11): 30 mg/h via INTRAVENOUS
  Filled 2013-10-12 (×26): qty 200

## 2013-10-12 MED ORDER — SODIUM CHLORIDE 0.9 % IV SOLN
INTRAVENOUS | Status: DC
  Administered 2013-10-12: 30 mL/h via INTRAVENOUS

## 2013-10-12 MED ORDER — MORPHINE SULFATE 2 MG/ML IJ SOLN: 2.0000 mg | INTRAMUSCULAR | Status: DC | PRN

## 2013-10-12 MED ORDER — HEPARIN (PORCINE) IN NACL 100-0.45 UNIT/ML-% IJ SOLN: 2300.0000 [IU]/h | INTRAMUSCULAR | Status: DC

## 2013-10-12 MED ORDER — AMIODARONE LOAD VIA INFUSION
150.0000 mg | Freq: Once | INTRAVENOUS | Status: AC
  Administered 2013-10-12: 150 mg via INTRAVENOUS
  Filled 2013-10-12: qty 83.34

## 2013-10-12 MED ORDER — POTASSIUM CHLORIDE CRYS ER 20 MEQ PO TBCR
20.0000 meq | EXTENDED_RELEASE_TABLET | Freq: Every day | ORAL | Status: DC
  Filled 2013-10-12: qty 1

## 2013-10-12 MED ORDER — MIDAZOLAM HCL 2 MG/2ML IJ SOLN
INTRAMUSCULAR | Status: AC
  Filled 2013-10-12: qty 2

## 2013-10-12 NOTE — Progress Notes (Signed)
Patient: Walter [name] / Admit Date: 10/09/2013 / Date of Encounter: 10/12/2013, 7:25 AM   Subjective: Weakness, dyspnea improved since admission. Anxious about cath.   Objective: Telemetry: atrial fib LBBB rates 50s-60s overnight then 80s-130s daytime (higher with ambulation) Physical Exam: Blood pressure 113/85, pulse 68, temperature 98 F (36.7 C), temperature source Oral, resp. rate 18, height 6' (1.829 m), weight 253 lb 6.4 oz (114.941 kg), SpO2 93.00%. General: Well developed, well nourished WM, in no acute distress. Head: Normocephalic, atraumatic, sclera non-icteric, no xanthomas, nares are without discharge. Neck: Negative for carotid bruits. JVP not elevated. Lungs: Clear bilaterally to auscultation without wheezes, rales, or rhonchi. Breathing is unlabored. Heart: Irregularly irregular, borderline tachy, S1 S2 without murmurs, rubs, or gallops.  Abdomen: Soft, non-tender, non-distended with normoactive bowel sounds. No rebound/guarding. Extremities: No clubbing or cyanosis. No edema. Distal pedal pulses are 2+ and equal bilaterally. Neuro: Alert and oriented X 3. Moves all extremities spontaneously. Psych:  Responds to questions appropriately with a normal affect.   Intake/Output Summary (Last 24 hours) at 10/12/13 0725 Last data filed at 10/11/13 1803  Gross per 24 hour  Intake   1080 ml  Output    500 ml  Net    580 ml    Inpatient Medications:  . aspirin  81 mg Oral BID  . celecoxib  200 mg Oral Daily  . cholecalciferol  1,000 Units Oral Daily  . diltiazem  20 mg Intravenous Once  . hydrochlorothiazide  25 mg Oral Q breakfast  . insulin aspart  0-15 Units Subcutaneous TID WC  . lisinopril  10 mg Oral Daily  . metFORMIN  500 mg Oral BID WC  . omega-3 acid ethyl esters  1 g Oral BID  . pantoprazole  40 mg Oral Daily  . rOPINIRole  1 mg Oral QHS  . sodium chloride  3 mL Intravenous Q12H   Infusions:  . sodium chloride 10 mL (10/12/13 0626)  . diltiazem  (CARDIZEM) infusion 15 mg/hr (10/12/13 0016)  . heparin 2,300 Units/hr (10/12/13 0626)    Labs:  Recent Labs  10/10/13 2238 10/12/13 0030  NA 137 139  K 3.8 3.5*  CL 100 98  CO2 23 26  GLUCOSE 116* 132*  BUN 20 12  CREATININE 0.95 1.02  CALCIUM 8.7 8.7    Recent Labs  10/09/13 1250  AST 34  ALT 65*  ALKPHOS 51  BILITOT 1.2  PROT 6.4  ALBUMIN 3.9    Recent Labs  10/09/13 1250  10/12/13 0030 10/12/13 0501  WBC 10.0  < > 7.4 7.9  NEUTROABS 7.0  --   --   --   HGB 18.5*  < > 15.9 16.4  HCT 55.1*  < > 48.0 49.7  MCV 98.6  < > 97.2 97.8  PLT 265  < > 206 202  < > = values in this interval not displayed.  Recent Labs  10/09/13 1250 10/09/13 1845 10/10/13 0033 10/10/13 0640  TROPONINI <0.30 <0.30 <0.30 <0.30   No components found with this basename: POCBNP,   Recent Labs  10/09/13 2106  HGBA1C 6.6*     Radiology/Studies:  Nm Myocar Multi W/spect W/wall Motion / Ef  10/10/2013   CLINICAL DATA:  68 year old with current history of nonischemic cardiomyopathy and previous nonobstructive coronary artery disease on outside heart catheterization in 2011, presenting with recent onset of atrial fibrillation and rapid ventricular response.  EXAM: MYOCARDIAL IMAGING WITH SPECT (REST AND PHARMACOLOGIC-STRESS)  GATED LEFT VENTRICULAR WALL MOTION STUDY  LEFT VENTRICULAR EJECTION FRACTION  TECHNIQUE: Standard myocardial SPECT imaging was performed after resting intravenous injection of 10 mCi Tc-73m sestamibi. Subsequently, intravenous infusion of Lexiscan was performed under the supervision of the Cardiology staff. At peak effect of the drug, 30 mCi Tc-4m sestamibi was injected intravenously and standard myocardial SPECT imaging was performed. Quantitative gated imaging was also performed to evaluate left ventricular wall motion, and estimate left ventricular ejection fraction.  COMPARISON:  None.  FINDINGS: Immediate post regadenoson images demonstrate diminished activity in  the inferior wall, apex, and inferolateral wall. Activity in the anterior wall is normal. Initial resting images demonstrate similar findings. No evidence of reversibility to suggest ischemia. Findings confirmed by the computer generated polar map.  Gated images demonstrate severe global hypokinesis with essential akinesis in the inferior wall and apex.  Estimated QGS left ventricular ejection fraction measured 18%, with an end-diastolic volume of 620 ml and an end systolic volume of 355 ml.  IMPRESSION: 1. Inferior wall, apical and inferolateral wall MI. No evidence of myocardial ischemia. 2. Severe global hypokinesis with a central akinesis in the inferior wall and apex. 3. Estimated QGS ejection fraction 18%. 4. Left ventricular enlargement.   Electronically Signed   By: Evangeline Dakin M.D.   On: 10/10/2013 12:47   Dg Chest Port 1 View  10/09/2013   CLINICAL DATA:  Shortness of breath. Abdominal pain. Weakness. Hypertension. Coronary artery disease.  EXAM: PORTABLE CHEST - 1 VIEW  COMPARISON:  CT chest 04/16/2013.  Chest x-ray 02/11/2013.  FINDINGS: Mediastinum and hilar structures are normal. Cardiomegaly. Normal pulmonary vascularity. Calcified pleural plaques are present consistent with prior asbestos exposure. No pleural effusion or pneumothorax. No acute bony abnormality.  IMPRESSION: 1. Cardiomegaly, no CHF. 2. Calcified pleural plaques. This is consistent with prior asbestos exposure. No change from prior chest CT of 04/16/2013 and chest x-ray of 02/11/2013. No acute pulmonary disease.   Electronically Signed   By: Marcello Moores  Register   On: 10/09/2013 13:52     Assessment and Plan  1. Atrial fibrillation with RVR, presumably recent onset with the last 2 weeks, but absolute duration uncertain 2. New drop in EF to 20% - h/o reported mild NICM EF 45-50% in 2011 with nonobstructive CAD in [place] at the time (30% LCx, 40% ostial RCA) - EF 18% by nuc, 20% by echo this admission - previously normal  EF 55% in 05/2013 3. Chronic LBBB 4. Reported history of factor V Leiden mutation 5. Hypertension 6. Type 2 diabetes mellitus 7. Hyperlipidemia, myalgias with statins  Check Mg (was on MagOx as outpatient, not continued here). Replete K and start daily supp while on HCTZ. He remains on diltiazem drip although this is not an ideal med with low EF. Will leave on for duration of cath, but after cath would consider changing to beta blocker. He is on the schedule for a R&LHC today to determine if drop in EF is related to coronary blockage versus possibly tachy-mediated. Risks/benefits discussed with the patient and he is in agreement of proceeding. Will need to clarify anatomy before transitioning to oral anticoag, keeping in mind CHADSVASC score is 4 and he has history of factor V Leiden mutation as well. If he needs to be on triple drug therapy may need to consider alternative to home Celebrex - he is aware. Would consider DCCV during this admission if cath unrevealing given drop in EF. Will need reassessment of EF in 3 months to determine if ICD needed. Discussed daily weights, salt/fluid  restriction with pt.  Signed, Melina Copa PA-C  I have personally seen and examined this patient with Melina Copa PA-C. I agree with the assessment and plan as outlined above. Complex patient. New drop in LVEF. Could be tachy mediated. Stress myoview with diffuse infarcts but no ischemia. Cath today to exclude progression of CAD. If no PCI needed, will consider TEE guided DCCV tomorrow. Continue heparin for now and IV Cardizem for rate control. NPO at midnight.   Chelcea Zahn 10/12/2013 8:58 AM

## 2013-10-12 NOTE — Progress Notes (Signed)
[facility] for Heparin  Indication: atrial fibrillation  Allergies  Allergen Reactions  . Statins     Myalgias.  Can only take Altoprev    Patient Measurements: Height: 6' (182.9 cm) Weight: 253 lb 6.4 oz (114.941 kg) IBW/kg (Calculated) : 77.6 Heparin Dosing Weight: 102 kg  Vital Signs: Temp: 98.3 F (36.8 C) (07/27 1014) Temp src: Oral (07/27 1014) BP: 99/77 mmHg (07/27 1400) Pulse Rate: 40 (07/27 1400)  Labs:  Recent Labs  10/09/13 1845  10/10/13 0033  10/10/13 0403 10/10/13 0640  10/10/13 2238 10/11/13 0455 10/11/13 1355 10/12/13 0030 10/12/13 0501 10/12/13 1400  HGB  --   --   --   < > 16.5  --   --  16.5 15.8  --  15.9 16.4  --   HCT  --   --   --   < > 50.2  --   --  49.5 47.7  --  48.0 49.7  --   PLT  --   --   --   < > 232  --   --  219 212  --  206 202  --   HEPARINUNFRC  --   < >  --   --  0.28*  --   < > 0.23* 0.20* 0.47  --  0.97* <0.10*  CREATININE  --   --   --   --  0.85  --   --  0.95  --   --  1.02  --   --   TROPONINI <0.30  --  <0.30  --   --  <0.30  --   --   --   --   --   --   --   < > = values in this interval not displayed.  Assessment: 68 y/o Male with Afib, now s/p cath.  Pharmacy asked to resume IV heparin 6 hrs after sheath pull.  This will be at approximately 1900 PM.  Heparin rate was reduced this AM due to high level to 2300 units/hr.  Will restart at that level.  Goal of Therapy:  Heparin level 0.3-0.7 units/ml Monitor platelets by anticoagulation protocol: Yes   Plan:  Restart Heparin 2300 units/hr at 1900 PM. Check heparin level in 8 hours after gtt resumed.  Uvaldo Rising, BCPS  Clinical Pharmacist Pager [phone]  10/12/2013 3:26 PM

## 2013-10-12 NOTE — Care Management Note (Addendum)
    Page 1 of 1   10/19/2013     12:03:15 PM CARE MANAGEMENT NOTE 10/19/2013  Patient:  REHMAN, LEVINSON   Account Number:  [id number]  Date Initiated:  10/12/2013  Documentation initiated by:  AMERSON,JULIE  Subjective/Objective Assessment:   Pt adm on 10/09/13 with Afib with RVR.  PTA, pt independent, lives with wife.     Action/Plan:   CM consult to check coverage on NOACs.  Will check coverage for both Xarelto and Eliquis...results to follow.   Anticipated DC Date:  10/14/2013   Anticipated DC Plan:  [place]  CM consult      Choice offered to / List presented to:          [facility] arranged  [place] - 11 Patient Refused      Status of service:  Completed, signed off Medicare Important Message given?  YES (If response is "NO", the following Medicare IM given date fields will be blank) Date Medicare IM given:  10/12/2013 Medicare IM given by:  AMERSON,JULIE Date Additional Medicare IM given:  10/15/2013 Additional Medicare IM given by:  [place]  Discharge Disposition:    Per UR Regulation:  Reviewed for med. necessity/level of care/duration of stay  If discussed at [place] of Stay Meetings, dates discussed:   10/15/2013    Comments:  [employer], [place] [phone]                 Difrancesco,Kris Daughter     [phone] 10/19/12 [place], RN, BSN, NCM [phone] Additional IM given at bedside.  7/27 1452 debbie dowell rn.bsn per cm sec pt has 20.00 per month copay for xarelto or eliquis.

## 2013-10-12 NOTE — Interval H&P Note (Signed)
Cath Lab Visit (complete for each Cath Lab visit)  Clinical Evaluation Leading to the Procedure:   ACS: No.  Non-ACS:    Anginal Classification: CCS III  Anti-ischemic medical therapy: Maximal Therapy (2 or more classes of medications)  Non-Invasive Test Results: No non-invasive testing performed  Prior CABG: No previous CABG      History and Physical Interval Note:  10/12/2013 10:26 AM  Walter [name]  has presented today for surgery, with the diagnosis of chest pain  The various methods of treatment have been discussed with the patient and family. After consideration of risks, benefits and other options for treatment, the patient has consented to  Procedure(s): LEFT AND RIGHT HEART CATHETERIZATION WITH CORONARY ANGIOGRAM (N/A) as a surgical intervention .  The patient's history has been reviewed, patient examined, no change in status, stable for surgery.  I have reviewed the patient's chart and labs.  Questions were answered to the patient's satisfaction.     KELLY,THOMAS A

## 2013-10-12 NOTE — CV Procedure (Signed)
Walter [name] is a 68 y.o. male   [id number]  [id number] LOCATION:  FACILITY: [facility]  PHYSICIAN: Troy Sine, MD, [facility] 1945/04/05   DATE OF PROCEDURE:  10/12/2013      RIGHT AND LEFT HEART CARDIAC CATHETERIZATION   HISTORY:  Walter [name] is a 68 y.o. male with a history of a nonischemic myopathy, chronic left bundle branch block, type 2 diabetes mellitus, hypertension, as well as hyperlipidemia.  He was admitted with heart rates up to 190 beats per minute, which was most likely atrial fibrillation.  He was treated with IV diltiazem.  The patient has continued to have rapid heartbeats.  A nuclear perfusion study showed an ejection fraction of approximately 18% with severe global hypokinesis is suggested.  A possible inferior wall, apical and inferolateral MI without ischemia.  He now presents for right and left heart cardiac catheterization.   PROCEDURE:  Right and left heart catheterization: Swan-Ganz catheterization, cardiac output determination by the thermodilution and Fick methods.  Oxygen saturation the pulmonary artery and AO. Coronary angiography.  Left ventricular pressure recording without ventriculography.  The patient was brought to the second floor [facility] cath lab in the postabsorptive state. Versed 2 mg and fentanyl 50 mcg were administered for conscious sedation. The right groin was prepped and draped in sterile fashion and a 5 [place] arterial sheath and 7 French venous sheath were inserted without difficulty. A Swan-Ganz catheter was advanced into the venous sheath and pressures were obtained in the right atrium, right ventricle, pulmonary artery, and pulmonary capillary wedge position.  The patient's had significant lability in his heart rate and his rates had risen to as high as 180-190 beats per minute.  Once the balloon catheter was wedged in the wedge position the patient then had a period of marked bradycardia and near asystole and was treated with atropine  0.5 mg.  Cardiac outputs were obtained by the thermodilution and assumed Fick methods. Oxygen saturation was obtained in the pulmonary artery and aorta on 2 L of oxygen. A pigtail catheter was inserted and was advanced into the left ventricle and simultaneous left ventricular and PA pressures were recorded.  Due to his previous near asystoe, the swan was not re-advanced to the wedge position.  There is significant ventricular irritability with heart rates again using the 190 beats per minute.  Amiodarone 300 mg IV bolus was administered and the patient was started on an amiodarone drip.  Due to the heart rate left ventriculography was not performed.  A left ventricle to aorta pullback was performed. The pigtail catheter was then removed and diagnostic catheterization to delineate the coronary anatomy was performed utilizing 5 French Judkins 4 left and right diagnostic catheters.  Completion of the study, reassess in a PA pressure recorded at the PA pressure had increased to 48/27, and the patient wasn't short of breath.  Lasix 40 mg intravenously and morphine 2 mg intravenously were administered.  With IV amiodarone, his heart rate did reduce back to the 120s.  All catheters were removed and the patient.  Since the patient was now started on intravenous amiodarone drip, the Cardizem drip was discontinued.  Rather than be transported back to his initial room on 2 W, the patient will be transferred to 1900.  Hemostasis was obtained by direct manual pressure. The patient tolerated the procedure well and returned to his room in satisfactory condition.   HEMODYNAMICS:   RA: 11 RV: 35/9 PA: 30/21 PC:  Visual 20-22 LV: 130/19 AO: 130/74  Repeat PA pressure at the completion of the procedure: 48/27 Oxygen saturation is obtained on 2 L nasal cannula in the aorta 95% and the pulmonary artery 70%  Cardiac output: 4.4 l/min (Thermo); 5.6 (Fick)  Cardiac index: 1.8 l/m/m2                2.4  ANGIOGRAPHY:   There  is evidence on fluoroscopy.  Coronary calcification.  Left main: Mild coronary calcification without stenosis and bifurcated into the LAD and left circumflex coronary artery.  LAD: Mild  calcification.  The vessel is free of significant obstructive disease and gave rise to a small first diagonal and large proximal second diagonal vessel.  LAD wrapped around it.  Left circumflex: Mild smooth 20% proximal narrowing prior to giving rise to a moderate-sized first marginal branch.  The remainder of the vessel was normal   Right coronary artery: Angiographically normal vessel  Left ventriculography  not performed due to due significant heart rate irritability and prior documentation of severe LV dysfunction.   Total contrast used: 50 cc  IMPRESSION:  Severe nonischemic cardiomyopathy  Mild coronary calcification without significant coronary obstructive disease with mild smooth 20% proximal circumflex stenosis.    Atrial fibrillation with rapid ventricular response  Transient profound bradycardia /near asystole requiring atropine.  RECOMMENDATION:  Medical therapy for his severe cardiomyopathy.  The patient is now on IV amiodarone with improvement. Ventricular rate control Cardizem.  Anticoagulation will be instituted post catheterization with medical therapy titration.  He did benefit from ultimate show sinus rhythm.  If left jugular function does not improve, life-vest/potential future ICD therapy may be necessary.  Troy Sine, MD, [facility] 10/12/2013 12:03 PM

## 2013-10-12 NOTE — Progress Notes (Signed)
[facility] for Heparin  Indication: atrial fibrillation  Allergies  Allergen Reactions  . Statins     Myalgias.  Can only take Altoprev    Patient Measurements: Height: 6' (182.9 cm) Weight: 253 lb 6.4 oz (114.941 kg) IBW/kg (Calculated) : 77.6 Heparin Dosing Weight: 102 kg  Vital Signs: Temp: 98 F (36.7 C) (07/27 0511) Temp src: Oral (07/27 0511) BP: 113/85 mmHg (07/27 0511) Pulse Rate: 68 (07/27 0511)  Labs:  Recent Labs  10/09/13 1250 10/09/13 1500 10/09/13 1845  10/10/13 0033 10/10/13 0403 10/10/13 0640  10/10/13 2238 10/11/13 0455 10/11/13 1355 10/12/13 0030 10/12/13 0501  HGB 18.5*  --   --   --   --  16.5  --   --  16.5 15.8  --  15.9 16.4  HCT 55.1*  --   --   --   --  50.2  --   --  49.5 47.7  --  48.0 49.7  PLT 265  --   --   --   --  232  --   --  219 212  --  206 202  APTT  --  99*  --   --   --   --   --   --   --   --   --   --   --   LABPROT  --  14.4  --   --   --   --   --   --   --   --   --   --   --   INR  --  1.12  --   --   --   --   --   --   --   --   --   --   --   HEPARINUNFRC  --   --   --   < >  --  0.28*  --   < > 0.23* 0.20* 0.47  --  0.97*  CREATININE 1.05  --   --   --   --  0.85  --   --  0.95  --   --  1.02  --   TROPONINI <0.30  --  <0.30  --  <0.30  --  <0.30  --   --   --   --   --   --   < > = values in this interval not displayed.  Assessment: 68 y/o Male with Afib awaiting cath, for heparin   Goal of Therapy:  Heparin level 0.3-0.7 units/ml Monitor platelets by anticoagulation protocol: Yes   Plan:  Decrease Heparin 2300 units/hr Check heparin level in 8 hours.    Caryl Pina 10/12/2013,5:42 AM

## 2013-10-12 NOTE — Progress Notes (Signed)
83fr arterial Sheath, and 58fr venous sheath aspirated and removed from rfa/v site. Manual pressure applied for 25 minutes. BP 108/70 hr 135 a-fib bbb spo2 93%. No s+s of hematoma, tegaderm dressing applied. Groin level 0. Bedrest instructions given. Distal pulse bilateral dp's palpable.

## 2013-10-12 NOTE — Interval H&P Note (Signed)
Cath Lab Visit (complete for each Cath Lab visit)  Clinical Evaluation Leading to the Procedure:   ACS: No.  Non-ACS:    Anginal Classification: CCS III  Anti-ischemic medical therapy: Maximal Therapy (2 or more classes of medications)  Non-Invasive Test Results: High-risk stress test findings: cardiac mortality >3%/year  Prior CABG: No previous CABG      History and Physical Interval Note:  10/12/2013 10:42 AM  Walter [name]  has presented today for surgery, with the diagnosis of chest pain  The various methods of treatment have been discussed with the patient and family. After consideration of risks, benefits and other options for treatment, the patient has consented to  Procedure(s): LEFT AND RIGHT HEART CATHETERIZATION WITH CORONARY ANGIOGRAM (N/A) as a surgical intervention .  The patient's history has been reviewed, patient examined, no change in status, stable for surgery.  I have reviewed the patient's chart and labs.  Questions were answered to the patient's satisfaction.     Austan Nicholl A

## 2013-10-12 NOTE — H&P (View-Only) (Signed)
Patient: Walter [name] / Admit Date: 10/09/2013 / Date of Encounter: 10/12/2013, 7:25 AM   Subjective: Weakness, dyspnea improved since admission. Anxious about cath.   Objective: Telemetry: atrial fib LBBB rates 50s-60s overnight then 80s-130s daytime (higher with ambulation) Physical Exam: Blood pressure 113/85, pulse 68, temperature 98 F (36.7 C), temperature source Oral, resp. rate 18, height 6' (1.829 m), weight 253 lb 6.4 oz (114.941 kg), SpO2 93.00%. General: Well developed, well nourished WM, in no acute distress. Head: Normocephalic, atraumatic, sclera non-icteric, no xanthomas, nares are without discharge. Neck: Negative for carotid bruits. JVP not elevated. Lungs: Clear bilaterally to auscultation without wheezes, rales, or rhonchi. Breathing is unlabored. Heart: Irregularly irregular, borderline tachy, S1 S2 without murmurs, rubs, or gallops.  Abdomen: Soft, non-tender, non-distended with normoactive bowel sounds. No rebound/guarding. Extremities: No clubbing or cyanosis. No edema. Distal pedal pulses are 2+ and equal bilaterally. Neuro: Alert and oriented X 3. Moves all extremities spontaneously. Psych:  Responds to questions appropriately with a normal affect.   Intake/Output Summary (Last 24 hours) at 10/12/13 0725 Last data filed at 10/11/13 1803  Gross per 24 hour  Intake   1080 ml  Output    500 ml  Net    580 ml    Inpatient Medications:  . aspirin  81 mg Oral BID  . celecoxib  200 mg Oral Daily  . cholecalciferol  1,000 Units Oral Daily  . diltiazem  20 mg Intravenous Once  . hydrochlorothiazide  25 mg Oral Q breakfast  . insulin aspart  0-15 Units Subcutaneous TID WC  . lisinopril  10 mg Oral Daily  . metFORMIN  500 mg Oral BID WC  . omega-3 acid ethyl esters  1 g Oral BID  . pantoprazole  40 mg Oral Daily  . rOPINIRole  1 mg Oral QHS  . sodium chloride  3 mL Intravenous Q12H   Infusions:  . sodium chloride 10 mL (10/12/13 0626)  . diltiazem  (CARDIZEM) infusion 15 mg/hr (10/12/13 0016)  . heparin 2,300 Units/hr (10/12/13 0626)    Labs:  Recent Labs  10/10/13 2238 10/12/13 0030  NA 137 139  K 3.8 3.5*  CL 100 98  CO2 23 26  GLUCOSE 116* 132*  BUN 20 12  CREATININE 0.95 1.02  CALCIUM 8.7 8.7    Recent Labs  10/09/13 1250  AST 34  ALT 65*  ALKPHOS 51  BILITOT 1.2  PROT 6.4  ALBUMIN 3.9    Recent Labs  10/09/13 1250  10/12/13 0030 10/12/13 0501  WBC 10.0  < > 7.4 7.9  NEUTROABS 7.0  --   --   --   HGB 18.5*  < > 15.9 16.4  HCT 55.1*  < > 48.0 49.7  MCV 98.6  < > 97.2 97.8  PLT 265  < > 206 202  < > = values in this interval not displayed.  Recent Labs  10/09/13 1250 10/09/13 1845 10/10/13 0033 10/10/13 0640  TROPONINI <0.30 <0.30 <0.30 <0.30   No components found with this basename: POCBNP,   Recent Labs  10/09/13 2106  HGBA1C 6.6*     Radiology/Studies:  Nm Myocar Multi W/spect W/wall Motion / Ef  10/10/2013   CLINICAL DATA:  68 year old with current history of nonischemic cardiomyopathy and previous nonobstructive coronary artery disease on outside heart catheterization in 2011, presenting with recent onset of atrial fibrillation and rapid ventricular response.  EXAM: MYOCARDIAL IMAGING WITH SPECT (REST AND PHARMACOLOGIC-STRESS)  GATED LEFT VENTRICULAR WALL MOTION STUDY  LEFT VENTRICULAR EJECTION FRACTION  TECHNIQUE: Standard myocardial SPECT imaging was performed after resting intravenous injection of 10 mCi Tc-37m sestamibi. Subsequently, intravenous infusion of Lexiscan was performed under the supervision of the Cardiology staff. At peak effect of the drug, 30 mCi Tc-35m sestamibi was injected intravenously and standard myocardial SPECT imaging was performed. Quantitative gated imaging was also performed to evaluate left ventricular wall motion, and estimate left ventricular ejection fraction.  COMPARISON:  None.  FINDINGS: Immediate post regadenoson images demonstrate diminished activity in  the inferior wall, apex, and inferolateral wall. Activity in the anterior wall is normal. Initial resting images demonstrate similar findings. No evidence of reversibility to suggest ischemia. Findings confirmed by the computer generated polar map.  Gated images demonstrate severe global hypokinesis with essential akinesis in the inferior wall and apex.  Estimated QGS left ventricular ejection fraction measured 18%, with an end-diastolic volume of 732 ml and an end systolic volume of 202 ml.  IMPRESSION: 1. Inferior wall, apical and inferolateral wall MI. No evidence of myocardial ischemia. 2. Severe global hypokinesis with a central akinesis in the inferior wall and apex. 3. Estimated QGS ejection fraction 18%. 4. Left ventricular enlargement.   Electronically Signed   By: Evangeline Dakin M.D.   On: 10/10/2013 12:47   Dg Chest Port 1 View  10/09/2013   CLINICAL DATA:  Shortness of breath. Abdominal pain. Weakness. Hypertension. Coronary artery disease.  EXAM: PORTABLE CHEST - 1 VIEW  COMPARISON:  CT chest 04/16/2013.  Chest x-ray 02/11/2013.  FINDINGS: Mediastinum and hilar structures are normal. Cardiomegaly. Normal pulmonary vascularity. Calcified pleural plaques are present consistent with prior asbestos exposure. No pleural effusion or pneumothorax. No acute bony abnormality.  IMPRESSION: 1. Cardiomegaly, no CHF. 2. Calcified pleural plaques. This is consistent with prior asbestos exposure. No change from prior chest CT of 04/16/2013 and chest x-ray of 02/11/2013. No acute pulmonary disease.   Electronically Signed   By: Marcello Moores  Register   On: 10/09/2013 13:52     Assessment and Plan  1. Atrial fibrillation with RVR, presumably recent onset with the last 2 weeks, but absolute duration uncertain 2. New drop in EF to 20% - h/o reported mild NICM EF 45-50% in 2011 with nonobstructive CAD in [place] at the time (30% LCx, 40% ostial RCA) - EF 18% by nuc, 20% by echo this admission - previously normal  EF 55% in 05/2013 3. Chronic LBBB 4. Reported history of factor V Leiden mutation 5. Hypertension 6. Type 2 diabetes mellitus 7. Hyperlipidemia, myalgias with statins  Check Mg (was on MagOx as outpatient, not continued here). Replete K and start daily supp while on HCTZ. He remains on diltiazem drip although this is not an ideal med with low EF. Will leave on for duration of cath, but after cath would consider changing to beta blocker. He is on the schedule for a R&LHC today to determine if drop in EF is related to coronary blockage versus possibly tachy-mediated. Risks/benefits discussed with the patient and he is in agreement of proceeding. Will need to clarify anatomy before transitioning to oral anticoag, keeping in mind CHADSVASC score is 4 and he has history of factor V Leiden mutation as well. If he needs to be on triple drug therapy may need to consider alternative to home Celebrex - he is aware. Would consider DCCV during this admission if cath unrevealing given drop in EF. Will need reassessment of EF in 3 months to determine if ICD needed. Discussed daily weights, salt/fluid  restriction with pt.  Signed, Melina Copa PA-C  I have personally seen and examined this patient with Melina Copa PA-C. I agree with the assessment and plan as outlined above. Complex patient. New drop in LVEF. Could be tachy mediated. Stress myoview with diffuse infarcts but no ischemia. Cath today to exclude progression of CAD. If no PCI needed, will consider TEE guided DCCV tomorrow. Continue heparin for now and IV Cardizem for rate control. NPO at midnight.   Enslee Bibbins 10/12/2013 8:58 AM

## 2013-10-13 ENCOUNTER — Encounter (HOSPITAL_COMMUNITY): Payer: Medicare Other | Admitting: Anesthesiology

## 2013-10-13 ENCOUNTER — Encounter (HOSPITAL_COMMUNITY): Payer: Self-pay | Admitting: Certified Registered Nurse Anesthetist

## 2013-10-13 ENCOUNTER — Encounter (HOSPITAL_COMMUNITY)
Admission: EM | Disposition: A | Discharge status: Home / Self Care | Payer: Self-pay | Source: Home / Self Care | Attending: Cardiology

## 2013-10-13 ENCOUNTER — Inpatient Hospital Stay (HOSPITAL_COMMUNITY): Payer: Medicare Other | Admitting: Anesthesiology

## 2013-10-13 DIAGNOSIS — I509 Heart failure, unspecified: Secondary | ICD-10-CM

## 2013-10-13 DIAGNOSIS — I5023 Acute on chronic systolic (congestive) heart failure: Secondary | ICD-10-CM

## 2013-10-13 HISTORY — PX: CARDIOVERSION: SHX1299

## 2013-10-13 LAB — CBC
HEMATOCRIT: 50.6 % (ref 39.0–52.0)
Hemoglobin: 16.7 g/dL (ref 13.0–17.0)
MCH: 32.4 pg (ref 26.0–34.0)
MCHC: 33 g/dL (ref 30.0–36.0)
MCV: 98.3 fL (ref 78.0–100.0)
Platelets: 204 10*3/uL (ref 150–400)
RBC: 5.15 MIL/uL (ref 4.22–5.81)
RDW: 15.2 % (ref 11.5–15.5)
WBC: 8.4 10*3/uL (ref 4.0–10.5)

## 2013-10-13 LAB — BASIC METABOLIC PANEL
Anion gap: 14 (ref 5–15)
BUN: 14 mg/dL (ref 6–23)
CHLORIDE: 98 meq/L (ref 96–112)
CO2: 27 meq/L (ref 19–32)
Calcium: 9 mg/dL (ref 8.4–10.5)
Creatinine, Ser: 0.98 mg/dL (ref 0.50–1.35)
GFR calc Af Amer: >90 mL/min (ref >90)
GFR calc non Af Amer: 82 mL/min — ABNORMAL LOW (ref >90)
GLUCOSE: 116 mg/dL — AB (ref 70–99)
POTASSIUM: 3.8 meq/L (ref 3.7–5.3)
Sodium: 139 mEq/L (ref 137–147)

## 2013-10-13 LAB — HEPARIN LEVEL (UNFRACTIONATED)
Heparin Unfractionated: 0.65 IU/mL (ref 0.30–0.70)
Heparin Unfractionated: 1.16 IU/mL — ABNORMAL HIGH (ref 0.30–0.70)
Heparin Unfractionated: 0.65 IU/mL (ref 0.30–0.70)

## 2013-10-13 LAB — GLUCOSE, CAPILLARY
GLUCOSE-CAPILLARY: 141 mg/dL — AB (ref 70–99)
Glucose-Capillary: 125 mg/dL — ABNORMAL HIGH (ref 70–99)
Glucose-Capillary: 128 mg/dL — ABNORMAL HIGH (ref 70–99)
Glucose-Capillary: 115 mg/dL — ABNORMAL HIGH (ref 70–99)

## 2013-10-13 SURGERY — CARDIOVERSION
Anesthesia: General

## 2013-10-13 MED ORDER — LIDOCAINE HCL (CARDIAC) 20 MG/ML IV SOLN
INTRAVENOUS | Status: DC | PRN
  Administered 2013-10-13: 40 mg via INTRAVENOUS

## 2013-10-13 MED ORDER — POTASSIUM CHLORIDE CRYS ER 20 MEQ PO TBCR: 40.0000 meq | EXTENDED_RELEASE_TABLET | Freq: Once | ORAL | Status: DC

## 2013-10-13 MED ORDER — SODIUM CHLORIDE 0.9 % IV SOLN
INTRAVENOUS | Status: DC | PRN
  Administered 2013-10-13: 14:00:00 via INTRAVENOUS

## 2013-10-13 MED ORDER — SODIUM CHLORIDE 0.9 % IJ SOLN: 3.0000 mL | INTRAMUSCULAR | Status: DC | PRN

## 2013-10-13 MED ORDER — MIDAZOLAM HCL 2 MG/2ML IJ SOLN
INTRAMUSCULAR | Status: AC
  Filled 2013-10-13: qty 4

## 2013-10-13 MED ORDER — MIDAZOLAM HCL 2 MG/2ML IJ SOLN
INTRAMUSCULAR | Status: AC
  Filled 2013-10-13: qty 2

## 2013-10-13 MED ORDER — MIDAZOLAM HCL 2 MG/2ML IJ SOLN
INTRAMUSCULAR | Status: AC
Start: 2013-10-13 — End: 2013-10-14
  Filled 2013-10-13: qty 2

## 2013-10-13 MED ORDER — MIDAZOLAM HCL 2 MG/2ML IJ SOLN
6.0000 mg | Freq: Once | INTRAMUSCULAR | Status: AC
  Administered 2013-10-13: 6 mg via INTRAVENOUS

## 2013-10-13 MED ORDER — FENTANYL BOLUS VIA INFUSION: 50.0000 ug | Freq: Once | INTRAVENOUS | Status: DC

## 2013-10-13 MED ORDER — PROPOFOL 10 MG/ML IV BOLUS
INTRAVENOUS | Status: DC | PRN
  Administered 2013-10-13 (×2): 70 mg via INTRAVENOUS

## 2013-10-13 MED ORDER — WARFARIN - PHARMACIST DOSING INPATIENT: Freq: Every day | Status: DC

## 2013-10-13 MED ORDER — SODIUM CHLORIDE 0.9 % IV SOLN: 250.0000 mL | INTRAVENOUS | Status: DC

## 2013-10-13 MED ORDER — ZOLPIDEM TARTRATE 5 MG PO TABS
5.0000 mg | ORAL_TABLET | Freq: Every evening | ORAL | Status: DC | PRN
  Administered 2013-10-13 – 2013-10-18 (×5): 5 mg via ORAL
  Filled 2013-10-13 (×5): qty 1

## 2013-10-13 MED ORDER — SODIUM CHLORIDE 0.9 % IV SOLN: INTRAVENOUS | Status: DC

## 2013-10-13 MED ORDER — METOPROLOL SUCCINATE ER 25 MG PO TB24
25.0000 mg | ORAL_TABLET | Freq: Two times a day (BID) | ORAL | Status: DC
  Administered 2013-10-13 – 2013-10-15 (×5): 25 mg via ORAL
  Filled 2013-10-13 (×7): qty 1

## 2013-10-13 MED ORDER — FLAVOXATE HCL 100 MG PO TABS
100.0000 mg | ORAL_TABLET | Freq: Three times a day (TID) | ORAL | Status: DC | PRN
  Administered 2013-10-13 – 2013-10-14 (×2): 100 mg via ORAL
  Filled 2013-10-13 (×2): qty 1

## 2013-10-13 MED ORDER — FENTANYL CITRATE 0.05 MG/ML IJ SOLN
INTRAMUSCULAR | Status: AC
  Filled 2013-10-13: qty 2

## 2013-10-13 MED ORDER — COUMADIN BOOK
Freq: Once | Status: AC
  Administered 2013-10-13: 13:00:00
  Filled 2013-10-13: qty 1

## 2013-10-13 MED ORDER — HYDROCORTISONE 1 % EX CREA
1.0000 "application " | TOPICAL_CREAM | Freq: Three times a day (TID) | CUTANEOUS | Status: DC | PRN
  Filled 2013-10-13: qty 28

## 2013-10-13 MED ORDER — AMIODARONE IV BOLUS ONLY 150 MG/100ML
150.0000 mg | Freq: Once | INTRAVENOUS | Status: AC
  Administered 2013-10-13: 150 mg via INTRAVENOUS

## 2013-10-13 MED ORDER — FUROSEMIDE 10 MG/ML IJ SOLN
40.0000 mg | Freq: Two times a day (BID) | INTRAMUSCULAR | Status: DC
  Administered 2013-10-13 – 2013-10-14 (×3): 40 mg via INTRAVENOUS
  Filled 2013-10-13 (×4): qty 4

## 2013-10-13 MED ORDER — FENTANYL CITRATE 0.05 MG/ML IJ SOLN
50.0000 ug | Freq: Once | INTRAMUSCULAR | Status: AC
  Administered 2013-10-13: 50 ug via INTRAVENOUS

## 2013-10-13 MED ORDER — INSULIN ASPART 100 UNIT/ML ~~LOC~~ SOLN: 0.0000 [IU] | Freq: Three times a day (TID) | SUBCUTANEOUS | Status: DC

## 2013-10-13 MED ORDER — INSULIN ASPART 100 UNIT/ML ~~LOC~~ SOLN
0.0000 [IU] | Freq: Three times a day (TID) | SUBCUTANEOUS | Status: DC
  Administered 2013-10-13 (×2): 2 [IU] via SUBCUTANEOUS
  Administered 2013-10-14: 3 [IU] via SUBCUTANEOUS
  Administered 2013-10-14: 2 [IU] via SUBCUTANEOUS

## 2013-10-13 MED ORDER — MAGNESIUM SULFATE 4000MG/100ML IJ SOLN
4.0000 g | Freq: Once | INTRAMUSCULAR | Status: AC
  Administered 2013-10-13: 4 g via INTRAVENOUS
  Filled 2013-10-13: qty 100

## 2013-10-13 MED ORDER — WARFARIN SODIUM 7.5 MG PO TABS
7.5000 mg | ORAL_TABLET | Freq: Once | ORAL | Status: AC
  Administered 2013-10-13: 7.5 mg via ORAL
  Filled 2013-10-13: qty 1

## 2013-10-13 MED ORDER — SODIUM CHLORIDE 0.9 % IJ SOLN
3.0000 mL | Freq: Two times a day (BID) | INTRAMUSCULAR | Status: DC
  Administered 2013-10-16: 3 mL via INTRAVENOUS

## 2013-10-13 MED ORDER — POTASSIUM CHLORIDE CRYS ER 20 MEQ PO TBCR
40.0000 meq | EXTENDED_RELEASE_TABLET | Freq: Two times a day (BID) | ORAL | Status: DC
  Administered 2013-10-13 – 2013-10-14 (×3): 40 meq via ORAL
  Filled 2013-10-13 (×5): qty 2

## 2013-10-13 NOTE — Progress Notes (Signed)
Pt's HR sustained in the 140's-150's. Pt states he feels "sweaty" but otherwise is asymptomatic. BP 103/68. Dr. Tommi Rumps notified and orders for 150 mg bolus of amiodarone given.

## 2013-10-13 NOTE — Transfer of Care (Signed)
Immediate Anesthesia Transfer of Care Note  Patient: Walter [name]  Procedure(s) Performed: Procedure(s): CARDIOVERSION (N/A)  Patient Location: ICU  Anesthesia Type:MAC  Level of Consciousness: awake, alert , oriented and patient cooperative  Airway & Oxygen Therapy: Patient Spontanous Breathing and Patient connected to nasal cannula oxygen  Post-op Assessment: Report given to PACU RN, Post -op Vital signs reviewed and stable and Patient moving all extremities X 4  Post vital signs: Reviewed and stable  Complications: No apparent anesthesia complications

## 2013-10-13 NOTE — Progress Notes (Signed)
[facility] for Heparin  Indication: atrial fibrillation  Allergies  Allergen Reactions  . Statins     Myalgias.  Can only take Altoprev    Patient Measurements: Height: 6' (182.9 cm) Weight: 251 lb 12.3 oz (114.2 kg) IBW/kg (Calculated) : 77.6 Heparin Dosing Weight: 102 kg  Vital Signs: Temp: 98.4 F (36.9 C) (07/28 1951) Temp src: Oral (07/28 1623) BP: 108/73 mmHg (07/28 2000) Pulse Rate: 103 (07/28 2000)  Labs:  Recent Labs  10/12/13 0030 10/12/13 0501  10/13/13 0255 10/13/13 1030 10/13/13 2150  HGB 15.9 16.4  --  16.7  --   --   HCT 48.0 49.7  --  50.6  --   --   PLT 206 202  --  204  --   --   HEPARINUNFRC  --  0.97*  < > 0.65 1.16* 0.65  CREATININE 1.02  --   --  0.98  --   --   < > = values in this interval not displayed.  . sodium chloride 10 mL (10/12/13 0626)  . sodium chloride 30 mL/hr at 10/13/13 0700  . sodium chloride    . sodium chloride    . amiodarone 30 mg/hr (10/13/13 2000)  . heparin 2,100 Units/hr (10/13/13 2000)     Assessment: 68 y/o Male with Afib, now s/p cath.  Pharmacy asked to resume IV heparin post cath. Heparin at upper end of goal this morning with am labs. CBC stable, no signs or symptoms of bleeding or hematoma. Heparin level high this AM; rate adjusted and PM f/u level is therapeutic.  Goal of Therapy:  Heparin level 0.3-0.7 units/ml Monitor platelets by anticoagulation protocol: Yes   Plan:  Continue Heparin 2100 units/hr Continue daily heparin level and CBC. Follow length of therapy for medical management with heparin.  Uvaldo Rising, BCPS  Clinical Pharmacist Pager [phone]  10/13/2013 10:41 PM

## 2013-10-13 NOTE — Progress Notes (Signed)
Patient ID: Walter [name], male   DOB: [birth year], 68 y.o.   MRN: [id number]    SUBJECTIVE: Breathing better since admission.  Remains in atrial fibrillation with RVR.    Scheduled Meds: . amiodarone  150 mg Intravenous Once  . celecoxib  200 mg Oral Daily  . cholecalciferol  1,000 Units Oral Daily  . diltiazem  20 mg Intravenous Once  . furosemide  40 mg Intravenous BID  . insulin aspart  0-15 Units Subcutaneous TID WC  . lisinopril  10 mg Oral Daily  . metoprolol succinate  25 mg Oral BID  . omega-3 acid ethyl esters  1 g Oral BID  . pantoprazole  40 mg Oral Daily  . potassium chloride  40 mEq Oral BID  . rOPINIRole  1 mg Oral QHS  . sodium chloride  3 mL Intravenous Q12H   Continuous Infusions: . sodium chloride 10 mL (10/12/13 0626)  . sodium chloride 30 mL/hr at 10/12/13 1900  . sodium chloride    . amiodarone 30 mg/hr (10/13/13 0457)  . heparin 2,300 Units/hr (10/12/13 1841)   PRN Meds:.acetaminophen, albuterol, HYDROcodone-acetaminophen, hydrocortisone cream, morphine injection, ondansetron (ZOFRAN) IV, rOPINIRole, sodium chloride, zolpidem    Filed Vitals:   10/13/13 0412 10/13/13 0500 10/13/13 0600 10/13/13 0701  BP:  103/68 103/71 91/70  Pulse:  77 56 52  Temp: 97.5 F (36.4 C)   97.9 F (36.6 C)  TempSrc: Oral   Oral  Resp:  14 17 12   Height:      Weight: 251 lb 12.3 oz (114.2 kg)     SpO2: 97% 95% 99% 97%    Intake/Output Summary (Last 24 hours) at 10/13/13 0854 Last data filed at 10/13/13 0600  Gross per 24 hour  Intake 2259.26 ml  Output   2670 ml  Net -410.74 ml    LABS: Basic Metabolic Panel:  Recent Labs  10/10/13 2238 10/12/13 0030 10/13/13 0255  NA 137 139 139  K 3.8 3.5* 3.8  CL 100 98 98  CO2 23 26 27   GLUCOSE 116* 132* 116*  BUN 20 12 14   CREATININE 0.95 1.02 0.98  CALCIUM 8.7 8.7 9.0  MG  --  1.6  --    Liver Function Tests: No results found for this basename: AST, ALT, ALKPHOS, BILITOT, PROT, ALBUMIN,  in the last 72  hours No results found for this basename: LIPASE, AMYLASE,  in the last 72 hours CBC:  Recent Labs  10/12/13 0501 10/13/13 0255  WBC 7.9 8.4  HGB 16.4 16.7  HCT 49.7 50.6  MCV 97.8 98.3  PLT 202 204   Cardiac Enzymes: No results found for this basename: CKTOTAL, CKMB, CKMBINDEX, TROPONINI,  in the last 72 hours BNP: No components found with this basename: POCBNP,  D-Dimer: No results found for this basename: DDIMER,  in the last 72 hours Hemoglobin A1C: No results found for this basename: HGBA1C,  in the last 72 hours Fasting Lipid Panel: No results found for this basename: CHOL, HDL, LDLCALC, TRIG, CHOLHDL, LDLDIRECT,  in the last 72 hours Thyroid Function Tests: No results found for this basename: TSH, T4TOTAL, FREET3, T3FREE, THYROIDAB,  in the last 72 hours Anemia Panel: No results found for this basename: VITAMINB12, FOLATE, FERRITIN, TIBC, IRON, RETICCTPCT,  in the last 72 hours  RADIOLOGY: Nm Myocar Multi W/spect W/wall Motion / Ef  10/10/2013   CLINICAL DATA:  68 year old with current history of nonischemic cardiomyopathy and previous nonobstructive coronary artery disease on outside heart catheterization in  2011, presenting with recent onset of atrial fibrillation and rapid ventricular response.  EXAM: MYOCARDIAL IMAGING WITH SPECT (REST AND PHARMACOLOGIC-STRESS)  GATED LEFT VENTRICULAR WALL MOTION STUDY  LEFT VENTRICULAR EJECTION FRACTION  TECHNIQUE: Standard myocardial SPECT imaging was performed after resting intravenous injection of 10 mCi Tc-29m sestamibi. Subsequently, intravenous infusion of Lexiscan was performed under the supervision of the Cardiology staff. At peak effect of the drug, 30 mCi Tc-59m sestamibi was injected intravenously and standard myocardial SPECT imaging was performed. Quantitative gated imaging was also performed to evaluate left ventricular wall motion, and estimate left ventricular ejection fraction.  COMPARISON:  None.  FINDINGS: Immediate post  regadenoson images demonstrate diminished activity in the inferior wall, apex, and inferolateral wall. Activity in the anterior wall is normal. Initial resting images demonstrate similar findings. No evidence of reversibility to suggest ischemia. Findings confirmed by the computer generated polar map.  Gated images demonstrate severe global hypokinesis with essential akinesis in the inferior wall and apex.  Estimated QGS left ventricular ejection fraction measured 18%, with an end-diastolic volume of 462 ml and an end systolic volume of 703 ml.  IMPRESSION: 1. Inferior wall, apical and inferolateral wall MI. No evidence of myocardial ischemia. 2. Severe global hypokinesis with a central akinesis in the inferior wall and apex. 3. Estimated QGS ejection fraction 18%. 4. Left ventricular enlargement.   Electronically Signed   By: Evangeline Dakin M.D.   On: 10/10/2013 12:47   Dg Chest Port 1 View  10/09/2013   CLINICAL DATA:  Shortness of breath. Abdominal pain. Weakness. Hypertension. Coronary artery disease.  EXAM: PORTABLE CHEST - 1 VIEW  COMPARISON:  CT chest 04/16/2013.  Chest x-ray 02/11/2013.  FINDINGS: Mediastinum and hilar structures are normal. Cardiomegaly. Normal pulmonary vascularity. Calcified pleural plaques are present consistent with prior asbestos exposure. No pleural effusion or pneumothorax. No acute bony abnormality.  IMPRESSION: 1. Cardiomegaly, no CHF. 2. Calcified pleural plaques. This is consistent with prior asbestos exposure. No change from prior chest CT of 04/16/2013 and chest x-ray of 02/11/2013. No acute pulmonary disease.   Electronically Signed   By: Johnson   On: 10/09/2013 13:52    PHYSICAL EXAM General: NAD Neck: JVP 10, no thyromegaly or thyroid nodule.  Lungs: Crackles at bases bilaterally. CV: Nondisplaced PMI.  Heart tachy irregular S1/S2, no S3/S4, no murmur.  1+ankle edema.   Abdomen: Soft, nontender, no hepatosplenomegaly, no distention.  Neurologic: Alert  and oriented x 3.  Psych: Normal affect. Extremities: No clubbing or cyanosis.   TELEMETRY: Reviewed telemetry pt in atrial fibrillation, HR 130s-140s  ASSESSMENT AND PLAN: 68 yo with history of LBBB and mild cardiomyopathy presented with atrial fibrillation/RVR and acute systolic CHF.   1. Atrial fibrillation: RVR, rate still high.  On amiodarone gtt and heparin gtt.  Not sure how long he has been in afib, likely several weeks. 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  - TEE-DCCV today.  - Heparin gtt bridged to warfarin to allow immediate cardioversion after TEE.  Will likely switch him to NOAC a month after DCCV.  - Add Toprol XL 25 mg bid.  - Will need to keep in NSR, will use amiodarone.  2. Acute on chronic systolic CHF: EF down to 50% from 50% in 3/15.  LHC/RHC yesterday with mildly elevated filling pressures and mild nonobstructive CAD. 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  - As above, TEE-DCCV today.  Need to keep in NSR.  - Continue  lisinopril, adding Toprol XL as above.   - Stop HCTZ, start Lasix 40 mg IV bid given some volume overload on exam.   Loralie Champagne 10/13/2013 9:00 AM

## 2013-10-13 NOTE — Anesthesia Preprocedure Evaluation (Signed)
Anesthesia Evaluation  Patient identified by MRN, date of birth, ID band Patient awake    Reviewed: Allergy & Precautions, H&P , NPO status , Patient's Chart, lab work & pertinent test results  History of Anesthesia Complications Negative for: history of anesthetic complications  Airway Mallampati: II      Dental   Pulmonary former smoker,    + decreased breath sounds      Cardiovascular hypertension, +CHF and + DOE + dysrhythmias Rhythm:Irregular Rate:Tachycardia     Neuro/Psych    GI/Hepatic negative GI ROS,   Endo/Other  diabetes  Renal/GU      Musculoskeletal   Abdominal (+) + obese,   Peds  Hematology   Anesthesia Other Findings   Reproductive/Obstetrics                           Anesthesia Physical Anesthesia Plan  ASA: III  Anesthesia Plan: General   Post-op Pain Management:    Induction: Intravenous  Airway Management Planned: Mask  Additional Equipment:   Intra-op Plan:   Post-operative Plan:   Informed Consent: I have reviewed the patients History and Physical, chart, labs and discussed the procedure including the risks, benefits and alternatives for the proposed anesthesia with the patient or authorized representative who has indicated his/her understanding and acceptance.     Plan Discussed with: CRNA and Surgeon  Anesthesia Plan Comments:         Anesthesia Quick Evaluation

## 2013-10-13 NOTE — Anesthesia Postprocedure Evaluation (Signed)
  Anesthesia Post-op Note  Patient: Walter [name]  Procedure(s) Performed: Procedure(s): CARDIOVERSION (N/A)  Patient Location: ICU  Anesthesia Type:MAC  Level of Consciousness: awake, alert , oriented and patient cooperative  Airway and Oxygen Therapy: Patient Spontanous Breathing and Patient connected to nasal cannula oxygen  Post-op Pain: none  Post-op Assessment: Post-op Vital signs reviewed, Patient's Cardiovascular Status Stable, Respiratory Function Stable, Patent Airway and No signs of Nausea or vomiting  Post-op Vital Signs: Reviewed and stable  Last Vitals:  Filed Vitals:   10/13/13 1138  BP: 131/84  Pulse: 40  Temp: 36.8 C  Resp: 17    Complications: No apparent anesthesia complications

## 2013-10-13 NOTE — CV Procedure (Signed)
    TRANSESOPHAGEAL ECHOCARDIOGRAM (TEE) NOTE  INDICATIONS: atrial fibrillation  PROCEDURE:   Informed consent was obtained prior to the procedure. The risks, benefits and alternatives for the procedure were discussed and the patient comprehended these risks.  Risks include, but are not limited to, cough, sore throat, vomiting, nausea, somnolence, esophageal and stomach trauma or perforation, bleeding, low blood pressure, aspiration, pneumonia, infection, trauma to the teeth and death.    After a procedural time-out, the patient was given 6 mg versed and 75 mcg fentanyl for moderate sedation.  The oropharynx was anesthetized 2 cetacaine sprays.  The transesophageal probe was inserted in the esophagus to 35 cm, however, could not be advanced further due to resistance.  COMPLICATIONS:    The tube could not be passed beyond 35 cm - ?upper esophageal stricture.  IMPRESSION:   1. Inability to successfully visualize the heart due to lodging of the probe in the upper esophagus.  The scope passed the UES easily and the patient was sedated with propofol per anethesia due to difficulty in achieving moderate sedation with versed and fentanyl.  RECOMMENDATIONS:    1. Consider barium esophagram to evaluate for cause of inability to pass the scope, prior to another attempt at TEE. Would be best to schedule the case in the endoscopy suite with anesthesia for sedation.  Time Spent Directly with the Patient:  45 minutes   Pixie Casino, MD, [facility] Attending Cardiologist [facility]  10/13/2013, 2:00 PM

## 2013-10-13 NOTE — H&P (Signed)
    INTERVAL PROCEDURE H&P  History and Physical Interval Note:  10/13/2013 1:17 PM  Walter [name] has presented today for their planned procedure. The various methods of treatment have been discussed with the patient and family. After consideration of risks, benefits and other options for treatment, the patient has consented to the procedure.  The patients' outpatient history has been reviewed, patient examined, and no change in status from most recent office note within the past 30 days. I have reviewed the patients' chart and labs and will proceed as planned. Questions were answered to the patient's satisfaction.   Pixie Casino, MD, [facility] Attending Cardiologist [facility]  Dawsen Krieger C 10/13/2013, 1:17 PM

## 2013-10-13 NOTE — Progress Notes (Signed)
[place] for Heparin  Indication: atrial fibrillation  Allergies  Allergen Reactions  . Statins     Myalgias.  Can only take Altoprev    Patient Measurements: Height: 6' (182.9 cm) Weight: 251 lb 12.3 oz (114.2 kg) IBW/kg (Calculated) : 77.6 Heparin Dosing Weight: 102 kg  Vital Signs: Temp: 97.9 F (36.6 C) (07/28 0701) Temp src: Oral (07/28 0701) BP: 91/70 mmHg (07/28 0701) Pulse Rate: 52 (07/28 0701)  Labs:  Recent Labs  10/10/13 2238  10/12/13 0030 10/12/13 0501 10/12/13 1400 10/13/13 0255  HGB 16.5  < > 15.9 16.4  --  16.7  HCT 49.5  < > 48.0 49.7  --  50.6  PLT 219  < > 206 202  --  204  HEPARINUNFRC 0.23*  < >  --  0.97* <0.10* 0.65  CREATININE 0.95  --  1.02  --   --  0.98  < > = values in this interval not displayed.  Assessment: 68 y/o Male with Afib, now s/p cath.  Pharmacy asked to resume IV heparin post cath. Heparin at upper end of goal this morning with am labs. CBC stable, no signs or symptoms of bleeding or hematoma. Will continue heparin at current rate and check confirmatory level later this morning.  Goal of Therapy:  Heparin level 0.3-0.7 units/ml Monitor platelets by anticoagulation protocol: Yes   Plan:  Continue Heparin 2300 units/hr Recheck heparin level this morning to confirm Follow length of therapy for medical management with heparin.  Erin Hearing PharmD., BCPS Clinical Pharmacist Pager [phone] 10/13/2013 8:07 AM

## 2013-10-13 NOTE — Progress Notes (Addendum)
ANTICOAGULATION CONSULT NOTE - Initial Consult  Pharmacy Consult for warfarin Indication: atrial fibrillation  Allergies  Allergen Reactions  . Statins     Myalgias.  Can only take Altoprev     Patient Measurements: Height: 6' (182.9 cm) Weight: 251 lb 12.3 oz (114.2 kg) IBW/kg (Calculated) : 77.6  Vital Signs: Temp: 97.9 F (36.6 C) (07/28 0701) Temp src: Oral (07/28 0701) BP: 149/70 mmHg (07/28 0800) Pulse Rate: 88 (07/28 0800)  Labs:  Recent Labs  10/10/13 2238  10/12/13 0030 10/12/13 0501 10/12/13 1400 10/13/13 0255  HGB 16.5  < > 15.9 16.4  --  16.7  HCT 49.5  < > 48.0 49.7  --  50.6  PLT 219  < > 206 202  --  204  HEPARINUNFRC 0.23*  < >  --  0.97* <0.10* 0.65  CREATININE 0.95  --  1.02  --   --  0.98  < > = values in this interval not displayed.  Estimated Creatinine Clearance: 92.8 ml/min (by C-G formula based on Cr of 0.98).   Medical History: Past Medical History  Diagnosis Date  . Essential hypertension   . DJD (degenerative joint disease)     /osteoarthritis  . Frozen shoulder     right  . Chronic left hip pain   . Chronic low back pain     /sciatica- Dr Letta Median [facility] weiner ortho)  . Asbestosis     mild  . GERD (gastroesophageal reflux disease)   . Hypogonadism male   . Coronary artery disease, non-occlusive June 2011    Cardiac cath in [facility] following abnormal Myoview  . Left bundle branch block (LBBB) on electrocardiogram     Diagnosed close to 20 years ago  . Obesity   . Factor V Leiden   . Testicular cancer 1972    left  . Type 2 diabetes mellitus without complication 11/5186    Type 2.   . Mild Cardiomyopathy, nonischemic June 2011    EF 45-50%; suspect related to LBBB; repeat Echo March 2015: EF 50- 55%  . Dyslipidemia, goal LDL below 100     On lovastatin (myalgias with Zocor and Lipitor)    Medications:  Scheduled:  . amiodarone  150 mg Intravenous Once  . cholecalciferol  1,000 Units Oral Daily  .  diltiazem  20 mg Intravenous Once  . furosemide  40 mg Intravenous BID  . insulin aspart  0-15 Units Subcutaneous TID WC  . lisinopril  10 mg Oral Daily  . metoprolol succinate  25 mg Oral BID  . omega-3 acid ethyl esters  1 g Oral BID  . pantoprazole  40 mg Oral Daily  . potassium chloride  40 mEq Oral BID  . rOPINIRole  1 mg Oral QHS  . sodium chloride  3 mL Intravenous Q12H   Infusions:  . sodium chloride 10 mL (10/12/13 0626)  . sodium chloride 30 mL/hr at 10/13/13 0700  . sodium chloride    . sodium chloride    . amiodarone 30 mg/hr (10/13/13 0700)  . heparin 2,300 Units/hr (10/13/13 0700)    Assessment: 68 yo m presenting on 7/24 for a fib with RVR. Patient on heparin and amiodarone drip with plans for DCCV today.  Pharmacy consulted to bridge heparin drip to warfarin to allow immediate cardioversion after TEE.  Hgb stable wnl, plts stable wnl, INR 1.12 BP variable, HR elevated  Patient is on amiodarone drip at 30 mg/hr and heparin drip at  2300 units/hr (  therapeutic). Patient has no PTA warfarin. Patient's sum total predictor points is >8.  Due to patient's weight and predictor points, will start warfarin 7.5 mg x 1.   Goal of Therapy:  INR 2-3 Monitor platelets by anticoagulation protocol: Yes   Plan:  Warfarin 7.5 mg x 1 INR daily Monitor CBC and H&H  Cassie L. Nicole Kindred, PharmD Clinical Pharmacy Resident Pager: [phone] 10/13/2013 11:15 AM  HL @ 1138 was supratherapeutic (1.16) on 2300 units/hr.  No bleeding was noted and discussed with RN.  Lab was drawn appropriately. Patient is currently undergoing DCCV.  Will hold the heparin drip for 30 minutes and resume rate at 2100 units/hr. Will check another HL @ 2100.  Cassie L. Nicole Kindred, PharmD Clinical Pharmacy Resident Pager: [phone] 10/13/2013 1:11 PM

## 2013-10-13 NOTE — Anesthesia Procedure Notes (Signed)
Procedure Name: MAC Date/Time: 10/13/2013 1:55 PM Performed by: Ned Grace Pre-anesthesia Checklist: Patient identified, Emergency Drugs available, Suction available, Patient being monitored and Timeout performed Patient Re-evaluated:Patient Re-evaluated prior to inductionOxygen Delivery Method: Ambu bag Preoxygenation: Pre-oxygenation with 100% oxygen Intubation Type: IV induction Ventilation: Mask ventilation without difficulty

## 2013-10-14 ENCOUNTER — Inpatient Hospital Stay (HOSPITAL_COMMUNITY): Payer: Medicare Other

## 2013-10-14 ENCOUNTER — Encounter (HOSPITAL_COMMUNITY): Payer: Self-pay | Admitting: Cardiology

## 2013-10-14 DIAGNOSIS — I5021 Acute systolic (congestive) heart failure: Secondary | ICD-10-CM

## 2013-10-14 LAB — CBC
HEMATOCRIT: 50 % (ref 39.0–52.0)
Hemoglobin: 16.9 g/dL (ref 13.0–17.0)
MCH: 33.1 pg (ref 26.0–34.0)
MCHC: 33.8 g/dL (ref 30.0–36.0)
MCV: 97.8 fL (ref 78.0–100.0)
Platelets: 205 10*3/uL (ref 150–400)
RBC: 5.11 MIL/uL (ref 4.22–5.81)
RDW: 15.3 % (ref 11.5–15.5)
WBC: 8.3 10*3/uL (ref 4.0–10.5)

## 2013-10-14 LAB — BASIC METABOLIC PANEL
Anion gap: 17 — ABNORMAL HIGH (ref 5–15)
BUN: 22 mg/dL (ref 6–23)
CALCIUM: 8.9 mg/dL (ref 8.4–10.5)
CO2: 24 mEq/L (ref 19–32)
CREATININE: 1.18 mg/dL (ref 0.50–1.35)
Chloride: 98 mEq/L (ref 96–112)
GFR calc Af Amer: 71 mL/min — ABNORMAL LOW (ref >90)
GFR, EST NON AFRICAN AMERICAN: 61 mL/min — AB (ref >90)
GLUCOSE: 122 mg/dL — AB (ref 70–99)
Potassium: 4.2 mEq/L (ref 3.7–5.3)
Sodium: 139 mEq/L (ref 137–147)

## 2013-10-14 LAB — PROTIME-INR
INR: 1.16 (ref 0.00–1.49)
Prothrombin Time: 14.8 seconds (ref 11.6–15.2)

## 2013-10-14 LAB — GLUCOSE, CAPILLARY
GLUCOSE-CAPILLARY: 123 mg/dL — AB (ref 70–99)
Glucose-Capillary: 115 mg/dL — ABNORMAL HIGH (ref 70–99)
Glucose-Capillary: 177 mg/dL — ABNORMAL HIGH (ref 70–99)
Glucose-Capillary: 186 mg/dL — ABNORMAL HIGH (ref 70–99)

## 2013-10-14 LAB — HEPARIN LEVEL (UNFRACTIONATED)
Heparin Unfractionated: 0.74 IU/mL — ABNORMAL HIGH (ref 0.30–0.70)
Heparin Unfractionated: 0.85 IU/mL — ABNORMAL HIGH (ref 0.30–0.70)

## 2013-10-14 MED ORDER — DIGOXIN 250 MCG PO TABS
0.2500 mg | ORAL_TABLET | Freq: Every day | ORAL | Status: DC
  Administered 2013-10-14 – 2013-10-18 (×5): 0.25 mg via ORAL
  Filled 2013-10-14 (×6): qty 1

## 2013-10-14 MED ORDER — AMIODARONE IV BOLUS ONLY 150 MG/100ML
150.0000 mg | Freq: Once | INTRAVENOUS | Status: AC
  Administered 2013-10-14: 150 mg via INTRAVENOUS

## 2013-10-14 MED ORDER — POTASSIUM CHLORIDE CRYS ER 20 MEQ PO TBCR
40.0000 meq | EXTENDED_RELEASE_TABLET | Freq: Every day | ORAL | Status: DC
  Administered 2013-10-15 – 2013-10-19 (×5): 40 meq via ORAL
  Filled 2013-10-14 (×5): qty 2

## 2013-10-14 MED ORDER — WARFARIN SODIUM 10 MG PO TABS
10.0000 mg | ORAL_TABLET | Freq: Once | ORAL | Status: DC
  Filled 2013-10-14: qty 1

## 2013-10-14 MED ORDER — AMIODARONE LOAD VIA INFUSION
150.0000 mg | Freq: Once | INTRAVENOUS | Status: AC
  Administered 2013-10-14: 150 mg via INTRAVENOUS
  Filled 2013-10-14: qty 83.34

## 2013-10-14 MED ORDER — FUROSEMIDE 10 MG/ML IJ SOLN
40.0000 mg | Freq: Every day | INTRAMUSCULAR | Status: DC
  Filled 2013-10-14: qty 4

## 2013-10-14 MED ORDER — IOHEXOL 300 MG/ML  SOLN
150.0000 mL | Freq: Once | INTRAMUSCULAR | Status: AC | PRN
  Administered 2013-10-14: 150 mL via ORAL

## 2013-10-14 MED ORDER — LOPERAMIDE HCL 2 MG PO CAPS
2.0000 mg | ORAL_CAPSULE | ORAL | Status: DC | PRN
Start: 2013-10-14 — End: 2013-10-19
  Administered 2013-10-14: 2 mg via ORAL
  Filled 2013-10-14: qty 1

## 2013-10-14 NOTE — Progress Notes (Signed)
ANTICOAGULATION CONSULT NOTE - Follow Up Consult  Pharmacy Consult for heparin Indication: atrial fibrillation  Allergies  Allergen Reactions  . Statins     Myalgias.  Can only take Altoprev     Patient Measurements: Height: 6' (182.9 cm) Weight: 249 lb 5.4 oz (113.1 kg) IBW/kg (Calculated) : 77.6 Heparin Dosing Weight: 102 kg  Vital Signs: Temp: 98.3 F (36.8 C) (07/29 1600) Temp src: Oral (07/29 1600) BP: 87/63 mmHg (07/29 1719)  Labs:  Recent Labs  10/12/13 0030 10/12/13 0501  10/13/13 0255  10/13/13 2150 10/14/13 0310 10/14/13 1645  HGB 15.9 16.4  --  16.7  --   --  16.9  --   HCT 48.0 49.7  --  50.6  --   --  50.0  --   PLT 206 202  --  204  --   --  205  --   LABPROT  --   --   --   --   --   --  14.8  --   INR  --   --   --   --   --   --  1.16  --   HEPARINUNFRC  --  0.97*  < > 0.65  < > 0.65 0.74* 0.85*  CREATININE 1.02  --   --  0.98  --   --  1.18  --   < > = values in this interval not displayed.  Estimated Creatinine Clearance: 76.7 ml/min (by C-G formula based on Cr of 1.18).   Medications:  Scheduled:  . cholecalciferol  1,000 Units Oral Daily  . digoxin  0.25 mg Oral Daily  . [START ON 10/15/2013] furosemide  40 mg Intravenous Daily  . insulin aspart  0-15 Units Subcutaneous TID WC  . lisinopril  10 mg Oral Daily  . metoprolol succinate  25 mg Oral BID  . omega-3 acid ethyl esters  1 g Oral BID  . pantoprazole  40 mg Oral Daily  . [START ON 10/15/2013] potassium chloride  40 mEq Oral Daily  . rOPINIRole  1 mg Oral QHS  . sodium chloride  3 mL Intravenous Q12H   Infusions:  . sodium chloride 10 mL (10/12/13 0626)  . sodium chloride 30 mL/hr at 10/13/13 0700  . sodium chloride    . amiodarone 30 mg/hr (10/14/13 1748)  . heparin 1,800 Units/hr (10/14/13 1748)    Assessment: 68 yo m who presented on 7/24 with a fib with RVR.  Pharmacy is consulted to monitor heparin.  Coag: AFib: heparin level continues to rise despite rate decreases.   Slight amount of hematuria early am, clear urine since midday.  Will  decrease the rate  to 1800 units/hr and check 6h heparin level  Hgb 16.9, plts 205  Goal of Therapy:  Heparin level 0.3-0.7 units/ml Monitor platelets by anticoagulation protocol: Yes   Plan:  Decrease heparin rate to 2000 units/hr Check heparin level @ 1700 Monitor for bleeding, hgb/plts  Thank you for allowing pharmacy to be a part of this patients care team.  Rowe Robert Pharm.D., BCPS, AQ-Cardiology Clinical Pharmacist 10/14/2013 5:49 PM Pager: [phone] Phone: [phone]

## 2013-10-14 NOTE — Progress Notes (Addendum)
Dr Carlean Purl requested to perform  EGD on pt. With possible dilatation of esophagus. Cardiologist unable to pass TEE scope yesterday. Esophagram today:  FINDINGS:  The patient swallows readily. There is occasional esophageal  dysmotility with mild tertiary contractions. There is no appreciable  hiatal hernia or demonstrable reflux. There is no stricture, mass,  or ulceration. No pharyngeal lesions are appreciable.  A 13 mm barium tablet passed freely to the level of the distal  esophagus where there was spasm. The tablet passed through the  distal esophagus into the stomach after several boluses of water.  IMPRESSION:  Intermittent esophageal dysmotility with spasm distally with solid  material. No esophageal mass, ulceration, or stricture seen. Study  otherwise unremarkable.  Pt on Coumadin but INR 1.16 today.     Note this pt had colonoscopy and polypectomy by Dr  [name] in Feb 2015. Dr Carlean Purl has agreed to perform EGD now and possible dilatation.  Will be setting this up for tomorrow.  I stopped Coumadin, this approved by Cardiologist. Also currently on Heparin. ? When is ok to stop this.  Left it going.  Cardiology needs to deal with stopping this, as GI will not be able to dilate esophagus if Heparin on board.   Azucena Freed PA-C

## 2013-10-14 NOTE — Progress Notes (Signed)
ANTICOAGULATION CONSULT NOTE - Follow Up Consult  Pharmacy Consult for heparin Indication: atrial fibrillation  Allergies  Allergen Reactions  . Statins     Myalgias.  Can only take Altoprev     Patient Measurements: Height: 6' (182.9 cm) Weight: 249 lb 5.4 oz (113.1 kg) IBW/kg (Calculated) : 77.6 Heparin Dosing Weight: 102 kg  Vital Signs: Temp: 98.2 F (36.8 C) (07/29 0730) Temp src: Oral (07/29 0730) BP: 111/59 mmHg (07/29 0700) Pulse Rate: 95 (07/29 0300)  Labs:  Recent Labs  10/12/13 0030 10/12/13 0501  10/13/13 0255 10/13/13 1030 10/13/13 2150 10/14/13 0310  HGB 15.9 16.4  --  16.7  --   --  16.9  HCT 48.0 49.7  --  50.6  --   --  50.0  PLT 206 202  --  204  --   --  205  LABPROT  --   --   --   --   --   --  14.8  INR  --   --   --   --   --   --  1.16  HEPARINUNFRC  --  0.97*  < > 0.65 1.16* 0.65 0.74*  CREATININE 1.02  --   --  0.98  --   --  1.18  < > = values in this interval not displayed.  Estimated Creatinine Clearance: 76.7 ml/min (by C-G formula based on Cr of 1.18).   Medications:  Scheduled:  . cholecalciferol  1,000 Units Oral Daily  . diltiazem  20 mg Intravenous Once  . furosemide  40 mg Intravenous BID  . insulin aspart  0-15 Units Subcutaneous TID WC  . lisinopril  10 mg Oral Daily  . metoprolol succinate  25 mg Oral BID  . omega-3 acid ethyl esters  1 g Oral BID  . pantoprazole  40 mg Oral Daily  . potassium chloride  40 mEq Oral BID  . rOPINIRole  1 mg Oral QHS  . sodium chloride  3 mL Intravenous Q12H  . Warfarin - Pharmacist Dosing Inpatient   Does not apply q1800   Infusions:  . sodium chloride 10 mL (10/12/13 0626)  . sodium chloride 30 mL/hr at 10/13/13 0700  . sodium chloride    . sodium chloride    . amiodarone 30 mg/hr (10/14/13 0786)  . heparin 2,100 Units/hr (10/14/13 7544)    Assessment: 68 yo m who presented on 7/24 with a fib with RVR. Patient was scheduled for DCCV yesterday, but during the TEE, probe was  lodged in upper esophagus and could not be done.  Pharmacy is consulted to monitor heparin.  Level this AM was slightly supratherapeutic (0.74). Will decrease the rate from 2100 units/hr to 2000 units/hr and check another heparin level in 8 hours (1700).  Hgb 16.9, plts 205  Goal of Therapy:  Heparin level 0.3-0.7 units/ml Monitor platelets by anticoagulation protocol: Yes   Plan:  Decrease heparin rate to 2000 units/hr Check heparin level @ 1700 Monitor for bleeding, hgb/plts  Cassie L. Nicole Kindred, PharmD Clinical Pharmacy Resident Pager: [phone] 10/14/2013 8:39 AM

## 2013-10-14 NOTE — Progress Notes (Signed)
ANTICOAGULATION CONSULT NOTE - Follow Up Consult  Pharmacy Consult for warfarin Indication: atrial fibrillation  Allergies  Allergen Reactions  . Statins     Myalgias.  Can only take Altoprev     Patient Measurements: Height: 6' (182.9 cm) Weight: 249 lb 5.4 oz (113.1 kg) IBW/kg (Calculated) : 77.6  Vital Signs: Temp: 98.2 F (36.8 C) (07/29 0730) Temp src: Oral (07/29 0730) BP: 94/71 mmHg (07/29 0800) Pulse Rate: 95 (07/29 0300)  Labs:  Recent Labs  10/12/13 0030 10/12/13 0501  10/13/13 0255 10/13/13 1030 10/13/13 2150 10/14/13 0310  HGB 15.9 16.4  --  16.7  --   --  16.9  HCT 48.0 49.7  --  50.6  --   --  50.0  PLT 206 202  --  204  --   --  205  LABPROT  --   --   --   --   --   --  14.8  INR  --   --   --   --   --   --  1.16  HEPARINUNFRC  --  0.97*  < > 0.65 1.16* 0.65 0.74*  CREATININE 1.02  --   --  0.98  --   --  1.18  < > = values in this interval not displayed.  Estimated Creatinine Clearance: 76.7 ml/min (by C-G formula based on Cr of 1.18).   Medical History: Past Medical History  Diagnosis Date  . Essential hypertension   . DJD (degenerative joint disease)     /osteoarthritis  . Frozen shoulder     right  . Chronic left hip pain   . Chronic low back pain     /sciatica- Dr Letta Median [facility]iner ortho)  . Asbestosis     mild  . GERD (gastroesophageal reflux disease)   . Hypogonadism male   . Coronary artery disease, non-occlusive June 2011    Cardiac cath in [facility] following abnormal Myoview  . Left bundle branch block (LBBB) on electrocardiogram     Diagnosed close to 20 years ago  . Obesity   . Factor V Leiden   . Testicular cancer 1972    left  . Type 2 diabetes mellitus without complication 07/5730    Type 2.   . Mild Cardiomyopathy, nonischemic June 2011    EF 45-50%; suspect related to LBBB; repeat Echo March 2015: EF 50- 55%  . Dyslipidemia, goal LDL below 100     On lovastatin (myalgias with Zocor and  Lipitor)    Medications:  Scheduled:  . cholecalciferol  1,000 Units Oral Daily  . diltiazem  20 mg Intravenous Once  . furosemide  40 mg Intravenous BID  . insulin aspart  0-15 Units Subcutaneous TID WC  . lisinopril  10 mg Oral Daily  . metoprolol succinate  25 mg Oral BID  . omega-3 acid ethyl esters  1 g Oral BID  . pantoprazole  40 mg Oral Daily  . potassium chloride  40 mEq Oral BID  . rOPINIRole  1 mg Oral QHS  . sodium chloride  3 mL Intravenous Q12H  . warfarin  10 mg Oral ONCE-1800  . Warfarin - Pharmacist Dosing Inpatient   Does not apply q1800   Infusions:  . sodium chloride 10 mL (10/12/13 0626)  . sodium chloride 30 mL/hr at 10/13/13 0700  . sodium chloride    . sodium chloride    . amiodarone 30 mg/hr (10/14/13 2025)  . heparin 2,000 Units/hr (10/14/13  0846)    Assessment: 68 yo m presenting on 7/24 for a fib with RVR. Patient on heparin and amiodarone drip. Patient was scheduled for DCCV yesterday, but during the TEE, probe was lodged in upper esophagus and could not be done. Pharmacy consulted to bridge heparin drip to warfarin to allow immediate cardioversion after TEE.  Hgb stable wnl, plts stable wnl, INR 1.16 BP variable, HR elevated  Due to patient's weight and predictor points, gave 7.5 mg x 1 yesterday.  INR this AM is 1.16, so will give 10 mg x 1 and check INR tomorrow AM.   Goal of Therapy:  INR 2-3 Monitor platelets by anticoagulation protocol: Yes   Plan:  Warfarin 10 mg x 1 INR daily Monitor CBC and H&H  Cassie L. Nicole Kindred, PharmD Clinical Pharmacy Resident Pager: [phone] 10/14/2013 8:56 AM

## 2013-10-14 NOTE — Progress Notes (Addendum)
Patient ID: Walter [name], male   DOB: Aug 19, 1945, 68 y.o.   MRN: [id number]    SUBJECTIVE:   Underwent attempted TEE and DC-CV yesterday but unable to pass probe past 35cm. Had barium swallow with ? Distal esophageal spasm. Able to take pills this am without problem   Remains in AF. Continues on IV lasix. Denies dyspnea. Down another 2 pounds.    Scheduled Meds: . cholecalciferol  1,000 Units Oral Daily  . diltiazem  20 mg Intravenous Once  . furosemide  40 mg Intravenous BID  . insulin aspart  0-15 Units Subcutaneous TID WC  . lisinopril  10 mg Oral Daily  . metoprolol succinate  25 mg Oral BID  . omega-3 acid ethyl esters  1 g Oral BID  . pantoprazole  40 mg Oral Daily  . potassium chloride  40 mEq Oral BID  . rOPINIRole  1 mg Oral QHS  . sodium chloride  3 mL Intravenous Q12H  . warfarin  10 mg Oral ONCE-1800  . Warfarin - Pharmacist Dosing Inpatient   Does not apply q1800   Continuous Infusions: . sodium chloride 10 mL (10/12/13 0626)  . sodium chloride 30 mL/hr at 10/13/13 0700  . sodium chloride    . sodium chloride    . amiodarone 30 mg/hr (10/14/13 0800)  . heparin 2,000 Units/hr (10/14/13 0846)   PRN Meds:.acetaminophen, albuterol, flavoxATE, HYDROcodone-acetaminophen, hydrocortisone cream, morphine injection, ondansetron (ZOFRAN) IV, rOPINIRole, sodium chloride, zolpidem    Filed Vitals:   10/14/13 0730 10/14/13 0800 10/14/13 0900 10/14/13 1000  BP:  94/71 81/58 106/60  Pulse:      Temp: 98.2 F (36.8 C)     TempSrc: Oral     Resp: 12 14 14 18   Height:      Weight:      SpO2: 97% 99% 95% 97%    Intake/Output Summary (Last 24 hours) at 10/14/13 1111 Last data filed at 10/14/13 1000  Gross per 24 hour  Intake 1980.45 ml  Output   2575 ml  Net -594.55 ml    LABS: Basic Metabolic Panel:  Recent Labs  10/12/13 0030 10/13/13 0255 10/14/13 0310  NA 139 139 139  K 3.5* 3.8 4.2  CL 98 98 98  CO2 26 27 24   GLUCOSE 132* 116* 122*  BUN 12 14 22     CREATININE 1.02 0.98 1.18  CALCIUM 8.7 9.0 8.9  MG 1.6  --   --    Liver Function Tests: No results found for this basename: AST, ALT, ALKPHOS, BILITOT, PROT, ALBUMIN,  in the last 72 hours No results found for this basename: LIPASE, AMYLASE,  in the last 72 hours CBC:  Recent Labs  10/13/13 0255 10/14/13 0310  WBC 8.4 8.3  HGB 16.7 16.9  HCT 50.6 50.0  MCV 98.3 97.8  PLT 204 205   Cardiac Enzymes: No results found for this basename: CKTOTAL, CKMB, CKMBINDEX, TROPONINI,  in the last 72 hours BNP: No components found with this basename: POCBNP,  D-Dimer: No results found for this basename: DDIMER,  in the last 72 hours Hemoglobin A1C: No results found for this basename: HGBA1C,  in the last 72 hours Fasting Lipid Panel: No results found for this basename: CHOL, HDL, LDLCALC, TRIG, CHOLHDL, LDLDIRECT,  in the last 72 hours Thyroid Function Tests: No results found for this basename: TSH, T4TOTAL, FREET3, T3FREE, THYROIDAB,  in the last 72 hours Anemia Panel: No results found for this basename: VITAMINB12, FOLATE, FERRITIN, TIBC, IRON, RETICCTPCT,  in the last 72 hours  RADIOLOGY: Nm Myocar Multi W/spect W/wall Motion / Ef  10/10/2013   CLINICAL DATA:  68 year old with current history of nonischemic cardiomyopathy and previous nonobstructive coronary artery disease on outside heart catheterization in 2011, presenting with recent onset of atrial fibrillation and rapid ventricular response.  EXAM: MYOCARDIAL IMAGING WITH SPECT (REST AND PHARMACOLOGIC-STRESS)  GATED LEFT VENTRICULAR WALL MOTION STUDY  LEFT VENTRICULAR EJECTION FRACTION  TECHNIQUE: Standard myocardial SPECT imaging was performed after resting intravenous injection of 10 mCi Tc-76m sestamibi. Subsequently, intravenous infusion of Lexiscan was performed under the supervision of the Cardiology staff. At peak effect of the drug, 30 mCi Tc-47m sestamibi was injected intravenously and standard myocardial SPECT imaging was  performed. Quantitative gated imaging was also performed to evaluate left ventricular wall motion, and estimate left ventricular ejection fraction.  COMPARISON:  None.  FINDINGS: Immediate post regadenoson images demonstrate diminished activity in the inferior wall, apex, and inferolateral wall. Activity in the anterior wall is normal. Initial resting images demonstrate similar findings. No evidence of reversibility to suggest ischemia. Findings confirmed by the computer generated polar map.  Gated images demonstrate severe global hypokinesis with essential akinesis in the inferior wall and apex.  Estimated QGS left ventricular ejection fraction measured 18%, with an end-diastolic volume of 893 ml and an end systolic volume of 810 ml.  IMPRESSION: 1. Inferior wall, apical and inferolateral wall MI. No evidence of myocardial ischemia. 2. Severe global hypokinesis with a central akinesis in the inferior wall and apex. 3. Estimated QGS ejection fraction 18%. 4. Left ventricular enlargement.   Electronically Signed   By: Evangeline Dakin M.D.   On: 10/10/2013 12:47   Dg Chest Port 1 View  10/09/2013   CLINICAL DATA:  Shortness of breath. Abdominal pain. Weakness. Hypertension. Coronary artery disease.  EXAM: PORTABLE CHEST - 1 VIEW  COMPARISON:  CT chest 04/16/2013.  Chest x-ray 02/11/2013.  FINDINGS: Mediastinum and hilar structures are normal. Cardiomegaly. Normal pulmonary vascularity. Calcified pleural plaques are present consistent with prior asbestos exposure. No pleural effusion or pneumothorax. No acute bony abnormality.  IMPRESSION: 1. Cardiomegaly, no CHF. 2. Calcified pleural plaques. This is consistent with prior asbestos exposure. No change from prior chest CT of 04/16/2013 and chest x-ray of 02/11/2013. No acute pulmonary disease.   Electronically Signed   By: Marcello Moores  Register   On: 10/09/2013 13:52   Filed Vitals:   10/14/13 0730 10/14/13 0800 10/14/13 0900 10/14/13 1000  BP:  94/71 81/58 106/60    Pulse:      Temp: 98.2 F (36.8 C)     TempSrc: Oral     Resp: 12 14 14 18   Height:      Weight:      SpO2: 97% 99% 95% 97%    PHYSICAL EXAM General: NAD Neck: JVP 5, no thyromegaly or thyroid nodule.  Lungs: Crackles at bases bilaterally. CV: Nondisplaced PMI.  Heart tachy irregular S1/S2, no S3/S4, no murmur.  No ankle edema.   Abdomen: Soft, nontender, no hepatosplenomegaly, no distention.  Neurologic: Alert and oriented x 3.  Psych: Normal affect. Extremities: No clubbing or cyanosis.   TELEMETRY: Reviewed telemetry pt in atrial fibrillation, HR 130s-140s  ASSESSMENT AND PLAN: 68 yo with history of LBBB and mild cardiomyopathy presented with atrial fibrillation/RVR and acute systolic CHF (EF 17%->51%).   1. Atrial fibrillation: RVR, rate still high 120-140.  On amiodarone gtt and heparin gtt.  Not sure how long he has been in afib, likely several  weeks. 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 Unable to pass TEE probe due to ?esophageal spasm - I feel he needs TEE and DC-CV. Discussed situation with Dr. Carlean Purl in GI who will review barium swallow. Will likely need EGD prior to repeat attempt at TEE & DC-CV. Will bolus more amio for rate control - Heparin gtt bridged to warfarin to allow immediate cardioversion after TEE.  Will likely switch him to NOAC a month after DCCV.  - Continue Toprol XL 25 mg bid. BP too soft to increase - Will need to keep in NSR, will use amiodarone. Add digoxin. 2. Acute on chronic systolic CHF: EF down to 08% from 50% in 3/15.  LHC/RHC 7/2/7 with mildly elevated filling pressures and mild nonobstructive CAD. 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  - Continue lisinopril and Toprol XL as above.   - Volume status looks good. Switch lasix to po 3. Esophageal spasm - Dr. Carlean Purl reviewed barium swallow feels it is likely spasm. Will coordinate for them to do EGD prior to repeat  TEE tomorrow am. Continue PPI 4. Urinary retention - Will d/c Foley. Can add Flomax if needed.   Can go to SDU.   Glori Bickers MD 10/14/2013 11:11 AM

## 2013-10-15 ENCOUNTER — Encounter (HOSPITAL_COMMUNITY): Payer: Self-pay | Admitting: *Deleted

## 2013-10-15 ENCOUNTER — Encounter (HOSPITAL_COMMUNITY): Payer: Medicare Other | Admitting: Anesthesiology

## 2013-10-15 ENCOUNTER — Encounter (HOSPITAL_COMMUNITY)
Admission: EM | Disposition: A | Discharge status: Home / Self Care | Payer: Self-pay | Source: Home / Self Care | Attending: Cardiology

## 2013-10-15 ENCOUNTER — Inpatient Hospital Stay (HOSPITAL_COMMUNITY): Payer: Medicare Other | Admitting: Anesthesiology

## 2013-10-15 DIAGNOSIS — K224 Dyskinesia of esophagus: Secondary | ICD-10-CM

## 2013-10-15 HISTORY — PX: ESOPHAGOGASTRODUODENOSCOPY: SHX5428

## 2013-10-15 LAB — HEPARIN LEVEL (UNFRACTIONATED)
HEPARIN UNFRACTIONATED: 0.58 [IU]/mL (ref 0.30–0.70)
Heparin Unfractionated: 0.71 IU/mL — ABNORMAL HIGH (ref 0.30–0.70)
Heparin Unfractionated: <0.1 IU/mL — ABNORMAL LOW (ref 0.30–0.70)

## 2013-10-15 LAB — CBC
HCT: 51.1 % (ref 39.0–52.0)
HEMOGLOBIN: 17 g/dL (ref 13.0–17.0)
MCH: 32.7 pg (ref 26.0–34.0)
MCHC: 33.3 g/dL (ref 30.0–36.0)
MCV: 98.3 fL (ref 78.0–100.0)
Platelets: 196 10*3/uL (ref 150–400)
RBC: 5.2 MIL/uL (ref 4.22–5.81)
RDW: 15.3 % (ref 11.5–15.5)
WBC: 8.2 10*3/uL (ref 4.0–10.5)

## 2013-10-15 LAB — GLUCOSE, CAPILLARY
GLUCOSE-CAPILLARY: 131 mg/dL — AB (ref 70–99)
Glucose-Capillary: 135 mg/dL — ABNORMAL HIGH (ref 70–99)
Glucose-Capillary: 115 mg/dL — ABNORMAL HIGH (ref 70–99)
Glucose-Capillary: 196 mg/dL — ABNORMAL HIGH (ref 70–99)

## 2013-10-15 LAB — PROTIME-INR
INR: 1.21 (ref 0.00–1.49)
PROTHROMBIN TIME: 15.3 s — AB (ref 11.6–15.2)

## 2013-10-15 SURGERY — EGD (ESOPHAGOGASTRODUODENOSCOPY)
Anesthesia: Monitor Anesthesia Care

## 2013-10-15 MED ORDER — WARFARIN SODIUM 7.5 MG PO TABS
7.5000 mg | ORAL_TABLET | Freq: Once | ORAL | Status: AC
  Administered 2013-10-15: 7.5 mg via ORAL
  Filled 2013-10-15: qty 1

## 2013-10-15 MED ORDER — FENTANYL CITRATE 0.05 MG/ML IJ SOLN
INTRAMUSCULAR | Status: DC | PRN
  Administered 2013-10-15: 50 ug via INTRAVENOUS
  Administered 2013-10-15: 100 ug via INTRAVENOUS

## 2013-10-15 MED ORDER — PROPOFOL 10 MG/ML IV BOLUS
INTRAVENOUS | Status: DC | PRN
  Administered 2013-10-15 (×2): 50 mg via INTRAVENOUS

## 2013-10-15 MED ORDER — FUROSEMIDE 40 MG PO TABS
40.0000 mg | ORAL_TABLET | Freq: Every day | ORAL | Status: DC
Start: 2013-10-15 — End: 2013-10-16
  Administered 2013-10-15: 40 mg via ORAL
  Filled 2013-10-15 (×2): qty 1

## 2013-10-15 MED ORDER — MIDAZOLAM HCL 5 MG/5ML IJ SOLN
INTRAMUSCULAR | Status: DC | PRN
  Administered 2013-10-15: 2 mg via INTRAVENOUS

## 2013-10-15 MED ORDER — INSULIN ASPART 100 UNIT/ML ~~LOC~~ SOLN
0.0000 [IU] | Freq: Three times a day (TID) | SUBCUTANEOUS | Status: DC
  Administered 2013-10-15: 3 [IU] via SUBCUTANEOUS
  Administered 2013-10-16 – 2013-10-18 (×6): 2 [IU] via SUBCUTANEOUS
  Administered 2013-10-19: 3 [IU] via SUBCUTANEOUS

## 2013-10-15 MED ORDER — MIDAZOLAM HCL 2 MG/2ML IJ SOLN: 1.0000 mg | Freq: Once | INTRAMUSCULAR | Status: DC

## 2013-10-15 MED ORDER — PROPOFOL INFUSION 10 MG/ML OPTIME
INTRAVENOUS | Status: DC | PRN
  Administered 2013-10-15: 100 ug/kg/min via INTRAVENOUS

## 2013-10-15 MED ORDER — BUTAMBEN-TETRACAINE-BENZOCAINE 2-2-14 % EX AERO
INHALATION_SPRAY | CUTANEOUS | Status: DC | PRN
  Administered 2013-10-15: 1 via TOPICAL

## 2013-10-15 MED ORDER — METOPROLOL SUCCINATE ER 25 MG PO TB24
25.0000 mg | ORAL_TABLET | Freq: Four times a day (QID) | ORAL | Status: DC
  Administered 2013-10-15 – 2013-10-16 (×3): 25 mg via ORAL
  Filled 2013-10-15 (×7): qty 1

## 2013-10-15 MED ORDER — WARFARIN - PHARMACIST DOSING INPATIENT: Freq: Every day | Status: DC

## 2013-10-15 NOTE — CV Procedure (Signed)
     The patient had a failed attempt at TEE earlier this week - Dr. Debara Pickett was unable to pass the scope. Dr. Carlean Purl performed an EGD prior to our 2nd attempt and the esophagus looked clear. I was unable to pass the scope any further than 35 cm.  Dr. Carlean Purl also attempted to pass the scope but was unable to .  The procedure was aborted at that time.  Thayer Headings, Brooke Bonito., MD, [facility] 10/15/2013, 1:01 PM [id number]  [place] Pager [phone]

## 2013-10-15 NOTE — Consult Note (Signed)
ELECTROPHYSIOLOGY CONSULT NOTE    Patient ID: Walter [name] MRN: [id number], DOB/AGE: 70-Apr-1947 70 y.o.  Admit date: 10/09/2013 Date of Consult: 10-15-13  Primary Physician: London Pepper, MD Primary Cardiologist: Aundra Dubin  Reason for Consultation: atrial fibrillation  HPI:  Walter [name] is a 68 y.o. male with a past medical history significant for hypertension, factor V leiden deficiency, obesity, diabetes, and hyperlipidemia.  He presented on 10-09-13 with shortness of breath and abdominal pain.  He was found to be in atrial fibrillation with RVR, ventricular rates 180's.  He was admitted and placed on IV diltiazem with improved rate control.  He was also placed on Heparin.  Myoview demonstrated decreased EF and he underwent catheterization which  demonstrated non ischemic cardiomyopathy with no significant coronary disease.  His rates became more difficult to control and he was placed on IV Amiodarone.  He has undergone attempted TEE/DCCV X2 without success because of inability to pass probe.  GI performed EGD without difficulty, but TEE probe was still unable to be passed.  EP has been asked to evaluate for treatment options   He denies recent chest pain, dizziness, palpitations, fevers, chills, nausea or vomiting. Today, he is fatigued.  ROS is otherwise negative.   Echocardiogram this admission demonstrated EF 20%, biatrial enlargement, trivial TR, trivial MR, LA 42.   Lab work is reviewed.   Past Medical History  Diagnosis Date  . Essential hypertension   . DJD (degenerative joint disease)     /osteoarthritis  . Frozen shoulder     right  . Chronic left hip pain   . Chronic low back pain     /sciatica- Dr Letta Median [facility] ortho)  . Asbestosis     mild  . GERD (gastroesophageal reflux disease)   . Hypogonadism male   . Coronary artery disease, non-occlusive June 2011    Cardiac cath in [facility] following abnormal Myoview  . Left bundle branch block (LBBB)  on electrocardiogram     Diagnosed close to 20 years ago  . Obesity   . Factor V Leiden   . Testicular cancer 1972    left  . Type 2 diabetes mellitus without complication 03/4479    Type 2.   . Mild Cardiomyopathy, nonischemic June 2011    EF 45-50%; suspect related to LBBB; repeat Echo March 2015: EF 50- 55%  . Dyslipidemia, goal LDL below 100     On lovastatin (myalgias with Zocor and Lipitor)     Surgical History:  Past Surgical History  Procedure Laterality Date  . Lymph nodes    . Tonsillectomy Bilateral   . Surgery scrotal / testicular Left   . Appendectomy N/A   . Rotator cuff repair Right   . Cholecystectomy open    . Hernia repair    . Knee arthroscopy Left   . Cardiac catheterization  June 2011    Nonobstructive CAD  . Transthoracic echocardiogram  May 2011    EF 45% with diffuse hypokinesis; septal bounce from LBBB  . Transthoracic echocardiogram  March 2015    EF 50-55%. Mild concentric LVH. Septal dyssynergy from LBBB   . Cardioversion N/A 10/13/2013    Procedure: CARDIOVERSION;  Surgeon: Larey Dresser, MD;  Location: [place];  Service: Cardiovascular;  Laterality: N/A;     Prescriptions prior to admission  Medication Sig Dispense Refill  . albuterol (PROVENTIL HFA;VENTOLIN HFA) 108 (90 BASE) MCG/ACT inhaler Inhale 2 puffs into the lungs as needed for wheezing or shortness of  breath.      Marland Kitchen aspirin 81 MG tablet Take 81 mg by mouth 2 (two) times daily.      . celecoxib (CELEBREX) 200 MG capsule Take 200 mg by mouth daily.      . cholecalciferol (VITAMIN D) 1000 UNITS tablet Take 1,000 Units by mouth daily.      Marland Kitchen esomeprazole (NEXIUM) 40 MG capsule Take 40 mg by mouth daily at 12 noon.      . hydrochlorothiazide (HYDRODIURIL) 25 MG tablet Take 25 mg by mouth daily.      Marland Kitchen HYDROcodone-acetaminophen (NORCO/VICODIN) 5-325 MG per tablet Take 2 tablets by mouth every 6 (six) hours as needed for moderate pain.      Marland Kitchen lisinopril (PRINIVIL,ZESTRIL) 40 MG tablet Take 40  mg by mouth daily.      Marland Kitchen loperamide (IMODIUM) 2 MG capsule Take by mouth as needed for diarrhea or loose stools.      . lovastatin (ALTOPREV) 60 MG 24 hr tablet Take 60 mg by mouth at bedtime.      . magnesium oxide (MAG-OX) 400 MG tablet Take 400 mg by mouth daily.      . metFORMIN (GLUCOPHAGE) 500 MG tablet Take 500 mg by mouth 2 (two) times daily with a meal.      . omega-3 acid ethyl esters (LOVAZA) 1 G capsule Take 1 g by mouth 2 (two) times daily.      Marland Kitchen rOPINIRole (REQUIP) 0.5 MG tablet Take 0.5 mg by mouth daily as needed.      Marland Kitchen rOPINIRole (REQUIP) 1 MG tablet Take 1 mg by mouth at bedtime.      . sildenafil (VIAGRA) 100 MG tablet Take 100 mg by mouth daily as needed for erectile dysfunction.      Marland Kitchen testosterone cypionate (DEPO-TESTOSTERONE) 200 MG/ML injection Inject into the muscle every 7 (seven) days.      Marland Kitchen tiZANidine (ZANAFLEX) 4 MG capsule Take 4 mg by mouth as needed for muscle spasms.        Inpatient Medications:  . cholecalciferol  1,000 Units Oral Daily  . digoxin  0.25 mg Oral Daily  . furosemide  40 mg Oral Daily  . insulin aspart  0-15 Units Subcutaneous TID WC  . lisinopril  10 mg Oral Daily  . metoprolol succinate  25 mg Oral BID  . midazolam  1 mg Intravenous Once  . omega-3 acid ethyl esters  1 g Oral BID  . pantoprazole  40 mg Oral Daily  . potassium chloride  40 mEq Oral Daily  . rOPINIRole  1 mg Oral QHS  . sodium chloride  3 mL Intravenous Q12H  . warfarin  7.5 mg Oral ONCE-1800  . Warfarin - Pharmacist Dosing Inpatient   Does not apply q1800    Allergies:  Allergies  Allergen Reactions  . Statins     Myalgias.  Can only take Altoprev     History   Social History  . Marital Status: Married    Spouse Name: N/A    Number of Children: N/A  . Years of Education: N/A   Occupational History  . Not on file.   Social History Main Topics  . Smoking status: Former Smoker    Types: Cigarettes  . Smokeless tobacco: Not on file  . Alcohol Use:  Yes     Comment: rare  . Drug Use: No  . Sexual Activity: Not on file   Other Topics Concern  . Not on file   Social  History Narrative  . No narrative on file     Family History  Problem Relation Age of Onset  . Hypertension Mother   . Heart disease Mother   . Heart disease Father   . Hypertension Brother   . Heart attack Maternal Uncle   . Heart disease Paternal Uncle   . Hypertension Brother     BP 108/75  Pulse 104  Temp(Src) 98.4 F (36.9 C) (Oral)  Resp 16  Ht 6' (1.829 m)  Wt 247 lb 5.7 oz (112.2 kg)  BMI 33.54 kg/m2  SpO2 94%  Physical Exam: Physical Exam: Filed Vitals:   10/15/13 1400 10/15/13 1430 10/15/13 1500 10/15/13 1600  BP: 85/56 115/96 106/83 113/82  Pulse:      Temp:    97.3 F (36.3 C)  TempSrc:    Oral  Resp:      Height:      Weight:      SpO2: 95% 94% 98% 97%    GEN- The patient is well appearing, alert and oriented x 3 today.   Head- normocephalic, atraumatic Eyes-  Sclera clear, conjunctiva pink Ears- hearing intact Oropharynx- clear Neck- supple, Lungs- Clear to ausculation bilaterally, normal work of breathing Heart- tachycardic irregular rhythm GI- soft, NT, ND, + BS Extremities- no clubbing, cyanosis, or edema  MS- no significant deformity or atrophy Skin- no rash or lesion Psych- euthymic mood, full affect Neuro- strength and sensation are intact    Labs:   Lab Results  Component Value Date   WBC 8.2 10/15/2013   HGB 17.0 10/15/2013   HCT 51.1 10/15/2013   MCV 98.3 10/15/2013   PLT 196 10/15/2013    Recent Labs Lab 10/09/13 1250  10/14/13 0310  NA 142  < > 139  K 4.5  < > 4.2  CL 98  < > 98  CO2 29  < > 24  BUN 25*  < > 22  CREATININE 1.05  < > 1.18  CALCIUM 8.9  < > 8.9  PROT 6.4  --   --   BILITOT 1.2  --   --   ALKPHOS 51  --   --   ALT 65*  --   --   AST 34  --   --   GLUCOSE 124*  < > 122*  < > = values in this interval not displayed. Lab Results  Component Value Date   TROPONINI <0.30 10/10/2013      Radiology/Studies: Nm Myocar Multi W/spect W/wall Motion / Ef 10/10/2013   CLINICAL DATA:  68 year old with current history of nonischemic cardiomyopathy and previous nonobstructive coronary artery disease on outside heart catheterization in 2011, presenting with recent onset of atrial fibrillation and rapid ventricular response.  EXAM: MYOCARDIAL IMAGING WITH SPECT (REST AND PHARMACOLOGIC-STRESS)  GATED LEFT VENTRICULAR WALL MOTION STUDY  LEFT VENTRICULAR EJECTION FRACTION  TECHNIQUE: Standard myocardial SPECT imaging was performed after resting intravenous injection of 10 mCi Tc-34m sestamibi. Subsequently, intravenous infusion of Lexiscan was performed under the supervision of the Cardiology staff. At peak effect of the drug, 30 mCi Tc-60m sestamibi was injected intravenously and standard myocardial SPECT imaging was performed. Quantitative gated imaging was also performed to evaluate left ventricular wall motion, and estimate left ventricular ejection fraction.  COMPARISON:  None.  FINDINGS: Immediate post regadenoson images demonstrate diminished activity in the inferior wall, apex, and inferolateral wall. Activity in the anterior wall is normal. Initial resting images demonstrate similar findings. No evidence of reversibility to suggest ischemia. Findings  confirmed by the computer generated polar map.  Gated images demonstrate severe global hypokinesis with essential akinesis in the inferior wall and apex.  Estimated QGS left ventricular ejection fraction measured 18%, with an end-diastolic volume of 035 ml and an end systolic volume of 597 ml.  IMPRESSION: 1. Inferior wall, apical and inferolateral wall MI. No evidence of myocardial ischemia. 2. Severe global hypokinesis with a central akinesis in the inferior wall and apex. 3. Estimated QGS ejection fraction 18%. 4. Left ventricular enlargement.   Electronically Signed   By: Evangeline Dakin M.D.   On: 10/10/2013 12:47   Dg Chest Port 1 View 10/09/2013    CLINICAL DATA:  Shortness of breath. Abdominal pain. Weakness. Hypertension. Coronary artery disease.  EXAM: PORTABLE CHEST - 1 VIEW  COMPARISON:  CT chest 04/16/2013.  Chest x-ray 02/11/2013.  FINDINGS: Mediastinum and hilar structures are normal. Cardiomegaly. Normal pulmonary vascularity. Calcified pleural plaques are present consistent with prior asbestos exposure. No pleural effusion or pneumothorax. No acute bony abnormality.  IMPRESSION: 1. Cardiomegaly, no CHF. 2. Calcified pleural plaques. This is consistent with prior asbestos exposure. No change from prior chest CT of 04/16/2013 and chest x-ray of 02/11/2013. No acute pulmonary disease.   Electronically Signed   By: Marcello Moores  Register   On: 10/09/2013 13:52   EKG: atrial fibrillation, LBBB, QRS 154 rate 91  TELEMETRY: afib with RVR, ventricular rates 100-140's  A/P  1. Persistent afib This presents a very difficult situation.  The patients afib has been very hard to rate control.  He is presently anticoagulated with heparin however TEE could not be performed despite multiple attempts. Continue rate control.  I will increase metoprolol.  We may have to proceed with cardioversion without TEE as the patient may not be able to wait 3 weeks for anticoagulation due to decompensated CHF. Continue IV amiodarone for rate control I will titrate metoprolol Ultimately he will do better in sinus rhythm.  I would not recommend PPM/ AV nodal ablation as an alternative I will discuss options with Dr Aundra Dubin.  2. Acute systolic dysfunction Likely tachycardia mediated Continue to rate control afib with plans ultimately for cardioversion  3. LBBB Careful with rate control for afib to avoid more advanced AV block

## 2013-10-15 NOTE — Op Note (Signed)
[facility] [place], [id number]   ENDOSCOPY PROCEDURE REPORT  PATIENT: Walter [name], Walter [name]  MR#: [id number] BIRTHDATE: [birth year] , 52  yrs. old GENDER: Male ENDOSCOPIST: Gatha Mayer, MD, [facility] PROCEDURE DATE:  10/15/2013 PROCEDURE:  EGD, diagnostic ASA CLASS:     Class III INDICATIONS:  unable to pass TEE scope. MEDICATIONS: See Anesthesia Report. TOPICAL ANESTHETIC: none  DESCRIPTION OF PROCEDURE: After the risks benefits and alternatives of the procedure were thoroughly explained, informed consent was obtained.  The PENTAX GASTOROSCOPE [id number] endoscope was introduced through the mouth and advanced to the second portion of the duodenum. Without limitations.  The instrument was slowly withdrawn as the mucosa was fully examined.      The upper, middle and distal third of the esophagus were carefully inspected and no abnormalities were noted.  The z-line was well seen at the GEJ.  The endoscope was pushed into the fundus which was normal including a retroflexed view.  The antrum, gastric body, first and second part of the duodenum were unremarkable. Retroflexed views revealed no abnormalities.     The scope was then withdrawn from the patient and the procedure completed.  COMPLICATIONS: There were no complications. ENDOSCOPIC IMPRESSION: Normal EGD  RECOMMENDATIONS: TEE attempted again  scope would not pass beyond 35 cm. No stricture here, ? extrinsic effect.  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    eSigned:  Gatha Mayer, MD, [facility] 10/15/2013 12:26 PM

## 2013-10-15 NOTE — Anesthesia Procedure Notes (Signed)
Procedure Name: MAC Date/Time: 10/15/2013 11:50 AM Performed by: Eligha Bridegroom Pre-anesthesia Checklist: Patient identified, Patient being monitored, Emergency Drugs available, Timeout performed and Suction available Patient Re-evaluated:Patient Re-evaluated prior to inductionOxygen Delivery Method: Nasal cannula Preoxygenation: Pre-oxygenation with 100% oxygen Intubation Type: IV induction

## 2013-10-15 NOTE — Progress Notes (Signed)
*  PRELIMINARY RESULTS*  Scope would not pass.  Walter [name] 10/15/2013, 12:34 PM

## 2013-10-15 NOTE — Progress Notes (Signed)
ANTICOAGULATION CONSULT NOTE - Follow Up Consult  Pharmacy Consult for heparin  Indication: atrial fibrillation  Allergies  Allergen Reactions  . Statins     Myalgias.  Can only take Altoprev     Patient Measurements: Height: 6' (182.9 cm) Weight: 249 lb 5.4 oz (113.1 kg) IBW/kg (Calculated) : 77.6 Heparin Dosing Weight:   Vital Signs: Temp: 97.7 F (36.5 C) (07/29 2349) Temp src: Oral (07/29 2349) BP: 83/57 mmHg (07/29 2349) Pulse Rate: 116 (07/29 2349)  Labs:  Recent Labs  10/12/13 0501  10/13/13 0255  10/14/13 0025 10/14/13 0310 10/14/13 1645  HGB 16.4  --  16.7  --   --  16.9  --   HCT 49.7  --  50.6  --   --  50.0  --   PLT 202  --  204  --   --  205  --   LABPROT  --   --   --   --   --  14.8  --   INR  --   --   --   --   --  1.16  --   HEPARINUNFRC 0.97*  < > 0.65  < > 0.71* 0.74* 0.85*  CREATININE  --   --  0.98  --   --  1.18  --   < > = values in this interval not displayed.  Estimated Creatinine Clearance: 76.7 ml/min (by C-G formula based on Cr of 1.18).   Medications:  Heparin infusing at 1800/hr.   Assessment: Heparin remains slightly above goal. Lab result from 7/29 at 0025 was actually 7/30 at 0025 just resulted onto wrong day.  Goal of Therapy:  Heparin level 0.3-0.7 units/ml Monitor platelets by anticoagulation protocol: Yes   Plan:  Decrease heparin to 1600 units/hr and f/u daily labs.   Curlene Dolphin 10/15/2013,2:37 AM

## 2013-10-15 NOTE — Transfer of Care (Signed)
Immediate Anesthesia Transfer of Care Note  Patient: Walter [name]  Procedure(s) Performed: Procedure(s) with comments: ESOPHAGOGASTRODUODENOSCOPY (EGD) (N/A) - bedside  Patient Location: PACU  Anesthesia Type:MAC  Level of Consciousness: awake and alert   Airway & Oxygen Therapy: Patient Spontanous Breathing and Patient connected to nasal cannula oxygen  Post-op Assessment: Report given to PACU RN and Post -op Vital signs reviewed and stable  Post vital signs: Reviewed and stable  Complications: No apparent anesthesia complications

## 2013-10-15 NOTE — Anesthesia Preprocedure Evaluation (Addendum)
Anesthesia Evaluation  Patient identified by MRN, date of birth, ID band Patient awake    Reviewed: Allergy & Precautions, H&P , NPO status , Patient's Chart, lab work & pertinent test results  Airway Mallampati: I TM Distance: >3 FB Neck ROM: Full    Dental   Pulmonary former smoker,          Cardiovascular hypertension, Pt. on medications + dysrhythmias Atrial Fibrillation     Neuro/Psych    GI/Hepatic   Endo/Other  diabetes, Well Controlled, Type 2, Oral Hypoglycemic Agents  Renal/GU      Musculoskeletal   Abdominal   Peds  Hematology   Anesthesia Other Findings   Reproductive/Obstetrics                          Anesthesia Physical Anesthesia Plan  ASA: III  Anesthesia Plan: MAC   Post-op Pain Management:    Induction: Intravenous  Airway Management Planned: Natural Airway  Additional Equipment:   Intra-op Plan:   Post-operative Plan:   Informed Consent: I have reviewed the patients History and Physical, chart, labs and discussed the procedure including the risks, benefits and alternatives for the proposed anesthesia with the patient or authorized representative who has indicated his/her understanding and acceptance.     Plan Discussed with: CRNA and Surgeon  Anesthesia Plan Comments:         Anesthesia Quick Evaluation

## 2013-10-15 NOTE — Progress Notes (Signed)
Patient ID: Walter [name], male   DOB: December 15, 1945, 68 y.o.   MRN: [id number]    SUBJECTIVE:   Underwent attempted TEE and DC-CV Tuesday but unable to pass probe past 35cm. Had barium swallow with ?distal esophageal spasm. Able to take pills this am problem.   Remains in AF with RVR. Digoxin started yesterday without much effect on rate. Denies dyspnea. Weight down.    Scheduled Meds: . cholecalciferol  1,000 Units Oral Daily  . digoxin  0.25 mg Oral Daily  . furosemide  40 mg Oral Daily  . insulin aspart  0-15 Units Subcutaneous TID WC  . lisinopril  10 mg Oral Daily  . metoprolol succinate  25 mg Oral BID  . omega-3 acid ethyl esters  1 g Oral BID  . pantoprazole  40 mg Oral Daily  . potassium chloride  40 mEq Oral Daily  . rOPINIRole  1 mg Oral QHS  . sodium chloride  3 mL Intravenous Q12H   Continuous Infusions: . sodium chloride 10 mL (10/12/13 0626)  . sodium chloride 30 mL/hr at 10/13/13 0700  . sodium chloride    . amiodarone 30 mg/hr (10/15/13 0600)  . heparin 1,600 Units/hr (10/15/13 0243)   PRN Meds:.acetaminophen, albuterol, flavoxATE, HYDROcodone-acetaminophen, hydrocortisone cream, loperamide, morphine injection, ondansetron (ZOFRAN) IV, rOPINIRole, sodium chloride, zolpidem    Filed Vitals:   10/14/13 2349 10/15/13 0410 10/15/13 0500 10/15/13 0740  BP: 83/57 105/66  110/84  Pulse: 116 98    Temp: 97.7 F (36.5 C) 97.8 F (36.6 C)  98.2 F (36.8 C)  TempSrc: Oral Oral  Oral  Resp: 16   17  Height:      Weight:   247 lb 5.7 oz (112.2 kg)   SpO2: 94% 96%  96%    Intake/Output Summary (Last 24 hours) at 10/15/13 0823 Last data filed at 10/15/13 0800  Gross per 24 hour  Intake 1422.4 ml  Output   1425 ml  Net   -2.6 ml    LABS: Basic Metabolic Panel:  Recent Labs  10/13/13 0255 10/14/13 0310  NA 139 139  K 3.8 4.2  CL 98 98  CO2 27 24  GLUCOSE 116* 122*  BUN 14 22  CREATININE 0.98 1.18  CALCIUM 9.0 8.9   Liver Function Tests: No results  found for this basename: AST, ALT, ALKPHOS, BILITOT, PROT, ALBUMIN,  in the last 72 hours No results found for this basename: LIPASE, AMYLASE,  in the last 72 hours CBC:  Recent Labs  10/14/13 0310 10/15/13 0353  WBC 8.3 8.2  HGB 16.9 17.0  HCT 50.0 51.1  MCV 97.8 98.3  PLT 205 196   Cardiac Enzymes: No results found for this basename: CKTOTAL, CKMB, CKMBINDEX, TROPONINI,  in the last 72 hours BNP: No components found with this basename: POCBNP,  D-Dimer: No results found for this basename: DDIMER,  in the last 72 hours Hemoglobin A1C: No results found for this basename: HGBA1C,  in the last 72 hours Fasting Lipid Panel: No results found for this basename: CHOL, HDL, LDLCALC, TRIG, CHOLHDL, LDLDIRECT,  in the last 72 hours Thyroid Function Tests: No results found for this basename: TSH, T4TOTAL, FREET3, T3FREE, THYROIDAB,  in the last 72 hours Anemia Panel: No results found for this basename: VITAMINB12, FOLATE, FERRITIN, TIBC, IRON, RETICCTPCT,  in the last 72 hours  RADIOLOGY: Nm Myocar Multi W/spect W/wall Motion / Ef  10/10/2013   CLINICAL DATA:  68 year old with current history of nonischemic cardiomyopathy and previous  nonobstructive coronary artery disease on outside heart catheterization in 2011, presenting with recent onset of atrial fibrillation and rapid ventricular response.  EXAM: MYOCARDIAL IMAGING WITH SPECT (REST AND PHARMACOLOGIC-STRESS)  GATED LEFT VENTRICULAR WALL MOTION STUDY  LEFT VENTRICULAR EJECTION FRACTION  TECHNIQUE: Standard myocardial SPECT imaging was performed after resting intravenous injection of 10 mCi Tc-41m sestamibi. Subsequently, intravenous infusion of Lexiscan was performed under the supervision of the Cardiology staff. At peak effect of the drug, 30 mCi Tc-1m sestamibi was injected intravenously and standard myocardial SPECT imaging was performed. Quantitative gated imaging was also performed to evaluate left ventricular wall motion, and  estimate left ventricular ejection fraction.  COMPARISON:  None.  FINDINGS: Immediate post regadenoson images demonstrate diminished activity in the inferior wall, apex, and inferolateral wall. Activity in the anterior wall is normal. Initial resting images demonstrate similar findings. No evidence of reversibility to suggest ischemia. Findings confirmed by the computer generated polar map.  Gated images demonstrate severe global hypokinesis with essential akinesis in the inferior wall and apex.  Estimated QGS left ventricular ejection fraction measured 18%, with an end-diastolic volume of 466 ml and an end systolic volume of 599 ml.  IMPRESSION: 1. Inferior wall, apical and inferolateral wall MI. No evidence of myocardial ischemia. 2. Severe global hypokinesis with a central akinesis in the inferior wall and apex. 3. Estimated QGS ejection fraction 18%. 4. Left ventricular enlargement.   Electronically Signed   By: Evangeline Dakin M.D.   On: 10/10/2013 12:47   Dg Chest Port 1 View  10/09/2013   CLINICAL DATA:  Shortness of breath. Abdominal pain. Weakness. Hypertension. Coronary artery disease.  EXAM: PORTABLE CHEST - 1 VIEW  COMPARISON:  CT chest 04/16/2013.  Chest x-ray 02/11/2013.  FINDINGS: Mediastinum and hilar structures are normal. Cardiomegaly. Normal pulmonary vascularity. Calcified pleural plaques are present consistent with prior asbestos exposure. No pleural effusion or pneumothorax. No acute bony abnormality.  IMPRESSION: 1. Cardiomegaly, no CHF. 2. Calcified pleural plaques. This is consistent with prior asbestos exposure. No change from prior chest CT of 04/16/2013 and chest x-ray of 02/11/2013. No acute pulmonary disease.   Electronically Signed   By: Marcello Moores  Register   On: 10/09/2013 13:52   Filed Vitals:   10/14/13 2349 10/15/13 0410 10/15/13 0500 10/15/13 0740  BP: 83/57 105/66  110/84  Pulse: 116 98    Temp: 97.7 F (36.5 C) 97.8 F (36.6 C)  98.2 F (36.8 C)  TempSrc: Oral Oral   Oral  Resp: 16   17  Height:      Weight:   247 lb 5.7 oz (112.2 kg)   SpO2: 94% 96%  96%    PHYSICAL EXAM General: NAD Neck: JVP not elevated, no thyromegaly or thyroid nodule.  Lungs: Crackles at bases bilaterally. CV: Nondisplaced PMI.  Heart tachy irregular S1/S2, no S3/S4, no murmur.  No ankle edema.   Abdomen: Soft, nontender, no hepatosplenomegaly, no distention.  Neurologic: Alert and oriented x 3.  Psych: Normal affect. Extremities: No clubbing or cyanosis.   TELEMETRY: Reviewed telemetry pt in atrial fibrillation, HR 130s  ASSESSMENT AND PLAN: 68 yo with history of LBBB and mild cardiomyopathy presented with atrial fibrillation/RVR and acute systolic CHF (EF 35%->70%).   1. Atrial fibrillation: RVR, rate still high 130s.  On amiodarone gtt and heparin gtt.  Not sure how long he has been in afib, likely several weeks. 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 Unable to  pass TEE probe due to ?esophageal spasm - I feel he needs TEE and DC-CV. Will likely need EGD prior to repeat attempt at TEE & DC-CV. - Heparin gtt bridged to warfarin to allow immediate cardioversion after TEE.  Will likely switch him to NOAC a month after DCCV.  - Continue Toprol XL 25 mg bid. BP too soft to increase.  Digoxin added yesterday. - Will need to keep in NSR, will use amiodarone.  2. Acute on chronic systolic CHF: EF down to 27% from 50% in 3/15.  LHC/RHC 7/2/7 with mildly elevated filling pressures and mild nonobstructive CAD. 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  - Continue lisinopril and Toprol XL as above.   - Volume status looks good. Switch lasix to po 3. Esophageal spasm: Dr. Carlean Purl reviewed barium swallow feels it is likely spasm. Will coordinate for them to do EGD prior to repeat TEE today.  I talked to Dr Carlean Purl, will keep him on heparin gtt to allow cardioversion as he thinks this is most likely spasm and not  stricture.  Continue PPI.  4. Urinary retention: Can add Flomax if needed.   Loralie Champagne MD 10/15/2013 8:23 AM

## 2013-10-15 NOTE — Progress Notes (Addendum)
ANTICOAGULATION CONSULT NOTE - Follow Up Consult  Pharmacy Consult for heparin Indication: atrial fibrillation  Allergies  Allergen Reactions  . Statins     Myalgias.  Can only take Altoprev     Patient Measurements: Height: 6' (182.9 cm) Weight: 247 lb 5.7 oz (112.2 kg) IBW/kg (Calculated) : 77.6 Heparin Dosing Weight: 102 kg  Vital Signs: Temp: 98.2 F (36.8 C) (07/30 0740) Temp src: Oral (07/30 0740) BP: 110/84 mmHg (07/30 0740) Pulse Rate: 98 (07/30 0410)  Labs:  Recent Labs  10/13/13 0255  10/14/13 0310 10/14/13 1645 10/15/13 0353  HGB 16.7  --  16.9  --  17.0  HCT 50.6  --  50.0  --  51.1  PLT 204  --  205  --  196  LABPROT  --   --  14.8  --  15.3*  INR  --   --  1.16  --  1.21  HEPARINUNFRC 0.65  < > 0.74* 0.85* 0.58  CREATININE 0.98  --  1.18  --   --   < > = values in this interval not displayed.  Estimated Creatinine Clearance: 76.4 ml/min (by C-G formula based on Cr of 1.18).   Medications:  Scheduled:  . cholecalciferol  1,000 Units Oral Daily  . digoxin  0.25 mg Oral Daily  . furosemide  40 mg Oral Daily  . insulin aspart  0-15 Units Subcutaneous TID WC  . lisinopril  10 mg Oral Daily  . metoprolol succinate  25 mg Oral BID  . omega-3 acid ethyl esters  1 g Oral BID  . pantoprazole  40 mg Oral Daily  . potassium chloride  40 mEq Oral Daily  . rOPINIRole  1 mg Oral QHS  . sodium chloride  3 mL Intravenous Q12H   Infusions:  . sodium chloride 10 mL (10/12/13 0626)  . sodium chloride 30 mL/hr at 10/13/13 0700  . sodium chloride    . amiodarone 30 mg/hr (10/15/13 0600)  . heparin 1,600 Units/hr (10/15/13 0243)    Assessment: 68 yo m who presented on 7/24 with a fib with RVR. Patient scheduled for a EDG today followed by a TEE and hopefully DCCV. Pharmacy is consulted to monitor heparin.  Heparin level this AM is therapeutic (0.58). Will obtain a confirmatory level in 6 hours (1500).  Hgb 17 stable, plts 196 stable   Goal of Therapy:   Heparin level 0.3-0.7 units/ml Monitor platelets by anticoagulation protocol: Yes   Plan:  Continue heparin rate at 1600 units/hr Heparin level at 1500 Monitor CBC and signs/symptoms of bleeding  Cassie L. Nicole Kindred, PharmD Clinical Pharmacy Resident Pager: [phone] 10/15/2013 8:53 AM  Warfarin restarted today, held yesterday for EGD this AM.  Based on INR, patient predictor points and weight, will give 7.5 mg x 1 and monitor daily INR.  INR today 1.21  Cassie L. Nicole Kindred, PharmD Clinical Pharmacy Resident Pager: [phone] 10/15/2013 9:53 AM

## 2013-10-15 NOTE — Progress Notes (Addendum)
ANTICOAGULATION CONSULT NOTE - Follow Up Consult  Pharmacy Consult for heparin Indication: atrial fibrillation  Allergies  Allergen Reactions  . Statins     Myalgias.  Can only take Altoprev     Patient Measurements: Height: 6' (182.9 cm) Weight: 247 lb 5.7 oz (112.2 kg) IBW/kg (Calculated) : 77.6 Heparin Dosing Weight: 102 kg  Vital Signs: Temp: 97.9 F (36.6 C) (07/30 1923) Temp src: Oral (07/30 1923) BP: 112/66 mmHg (07/30 2000) Pulse Rate: 104 (07/30 1244)  Labs:  Recent Labs  10/13/13 0255  10/14/13 0310 10/14/13 1645 10/15/13 0353 10/15/13 2050  HGB 16.7  --  16.9  --  17.0  --   HCT 50.6  --  50.0  --  51.1  --   PLT 204  --  205  --  196  --   LABPROT  --   --  14.8  --  15.3*  --   INR  --   --  1.16  --  1.21  --   HEPARINUNFRC 0.65  < > 0.74* 0.85* 0.58 <0.10*  CREATININE 0.98  --  1.18  --   --   --   < > = values in this interval not displayed.  Estimated Creatinine Clearance: 76.4 ml/min (by C-G formula based on Cr of 1.18).   Assessment: 68 yo m on heparin for afib. TEE was unable to be performed today. Heparin level therapeutic this a.m. on 1600 units/hr. Heparin level tonight now undetectable - spoke with RN and the heparin was off for his procedure today for a few hours. It was turned back on tonight so this level is likely inaccurate.   Goal of Therapy:  Heparin level 0.3-0.7 units/ml Monitor platelets by anticoagulation protocol: Yes   Plan:  Continue heparin at 1600 units/hr F/u 0200 heparin level  Sherlon Handing, PharmD, BCPS Clinical pharmacist, pager [phone] 10/15/2013 9:48 PM

## 2013-10-16 ENCOUNTER — Inpatient Hospital Stay (HOSPITAL_COMMUNITY): Payer: Medicare Other | Admitting: Anesthesiology

## 2013-10-16 ENCOUNTER — Encounter (HOSPITAL_COMMUNITY): Payer: Self-pay | Admitting: Anesthesiology

## 2013-10-16 ENCOUNTER — Encounter (HOSPITAL_COMMUNITY)
Admission: EM | Disposition: A | Discharge status: Home / Self Care | Payer: Self-pay | Source: Home / Self Care | Attending: Cardiology

## 2013-10-16 ENCOUNTER — Encounter (HOSPITAL_COMMUNITY): Payer: Medicare Other | Admitting: Anesthesiology

## 2013-10-16 ENCOUNTER — Ambulatory Visit (HOSPITAL_COMMUNITY): Admit: 2013-10-16 | Payer: Self-pay | Admitting: Internal Medicine

## 2013-10-16 DIAGNOSIS — I4891 Unspecified atrial fibrillation: Secondary | ICD-10-CM

## 2013-10-16 HISTORY — PX: ELECTROPHYSIOLOGY STUDY: SHX5467

## 2013-10-16 HISTORY — PX: CARDIOVERSION: SHX1299

## 2013-10-16 LAB — GLUCOSE, CAPILLARY
GLUCOSE-CAPILLARY: 171 mg/dL — AB (ref 70–99)
Glucose-Capillary: 138 mg/dL — ABNORMAL HIGH (ref 70–99)
Glucose-Capillary: 128 mg/dL — ABNORMAL HIGH (ref 70–99)
Glucose-Capillary: 96 mg/dL (ref 70–99)
Glucose-Capillary: 102 mg/dL — ABNORMAL HIGH (ref 70–99)

## 2013-10-16 LAB — BASIC METABOLIC PANEL
Anion gap: 17 — ABNORMAL HIGH (ref 5–15)
BUN: 29 mg/dL — ABNORMAL HIGH (ref 6–23)
CHLORIDE: 97 meq/L (ref 96–112)
CO2: 23 mEq/L (ref 19–32)
Calcium: 8.9 mg/dL (ref 8.4–10.5)
Creatinine, Ser: 1.15 mg/dL (ref 0.50–1.35)
GFR calc non Af Amer: 63 mL/min — ABNORMAL LOW (ref >90)
GFR, EST AFRICAN AMERICAN: 73 mL/min — AB (ref >90)
GLUCOSE: 141 mg/dL — AB (ref 70–99)
Potassium: 4 mEq/L (ref 3.7–5.3)
SODIUM: 137 meq/L (ref 137–147)

## 2013-10-16 LAB — CBC
HCT: 52.1 % — ABNORMAL HIGH (ref 39.0–52.0)
Hemoglobin: 17.5 g/dL — ABNORMAL HIGH (ref 13.0–17.0)
MCH: 32.7 pg (ref 26.0–34.0)
MCHC: 33.6 g/dL (ref 30.0–36.0)
MCV: 97.4 fL (ref 78.0–100.0)
PLATELETS: 200 10*3/uL (ref 150–400)
RBC: 5.35 MIL/uL (ref 4.22–5.81)
RDW: 15.1 % (ref 11.5–15.5)
WBC: 8.3 10*3/uL (ref 4.0–10.5)

## 2013-10-16 LAB — HEPARIN LEVEL (UNFRACTIONATED)
HEPARIN UNFRACTIONATED: 0.19 [IU]/mL — AB (ref 0.30–0.70)
Heparin Unfractionated: 0.49 IU/mL (ref 0.30–0.70)

## 2013-10-16 LAB — PROTIME-INR
INR: 1.14 (ref 0.00–1.49)
PROTHROMBIN TIME: 14.6 s (ref 11.6–15.2)

## 2013-10-16 LAB — POCT ACTIVATED CLOTTING TIME: ACTIVATED CLOTTING TIME: 112 s

## 2013-10-16 SURGERY — ELECTROPHYSIOLOGY STUDY
Anesthesia: Monitor Anesthesia Care

## 2013-10-16 MED ORDER — OXYCODONE HCL 5 MG/5ML PO SOLN: 5.0000 mg | Freq: Once | ORAL | Status: DC | PRN

## 2013-10-16 MED ORDER — ZOLPIDEM TARTRATE 5 MG PO TABS: 5.0000 mg | ORAL_TABLET | Freq: Every evening | ORAL | Status: DC | PRN

## 2013-10-16 MED ORDER — ALPRAZOLAM 0.5 MG PO TABS
0.5000 mg | ORAL_TABLET | Freq: Three times a day (TID) | ORAL | Status: DC | PRN
  Administered 2013-10-16: 0.5 mg via ORAL
  Filled 2013-10-16 (×2): qty 1

## 2013-10-16 MED ORDER — OXYCODONE HCL 5 MG/5ML PO SOLN: 5.0000 mg | Freq: Once | ORAL | Status: AC | PRN

## 2013-10-16 MED ORDER — FENTANYL CITRATE 0.05 MG/ML IJ SOLN
INTRAMUSCULAR | Status: DC | PRN
  Administered 2013-10-16 (×2): 50 ug via INTRAVENOUS

## 2013-10-16 MED ORDER — SODIUM CHLORIDE 0.9 % IJ SOLN: 3.0000 mL | INTRAMUSCULAR | Status: DC | PRN

## 2013-10-16 MED ORDER — HEPARIN (PORCINE) IN NACL 100-0.45 UNIT/ML-% IJ SOLN
1850.0000 [IU]/h | INTRAMUSCULAR | Status: DC
  Administered 2013-10-16 – 2013-10-18 (×3): 1700 [IU]/h via INTRAVENOUS
  Administered 2013-10-18 – 2013-10-19 (×2): 1800 [IU]/h via INTRAVENOUS
  Filled 2013-10-16 (×8): qty 250

## 2013-10-16 MED ORDER — HYDROMORPHONE HCL PF 1 MG/ML IJ SOLN: 0.2500 mg | INTRAMUSCULAR | Status: DC | PRN

## 2013-10-16 MED ORDER — ACETAMINOPHEN 325 MG PO TABS: 650.0000 mg | ORAL_TABLET | ORAL | Status: DC | PRN

## 2013-10-16 MED ORDER — BUPIVACAINE HCL (PF) 0.25 % IJ SOLN
INTRAMUSCULAR | Status: AC
  Filled 2013-10-16: qty 30

## 2013-10-16 MED ORDER — WARFARIN VIDEO: Freq: Once | Status: DC

## 2013-10-16 MED ORDER — FUROSEMIDE 40 MG PO TABS
40.0000 mg | ORAL_TABLET | Freq: Two times a day (BID) | ORAL | Status: DC
  Administered 2013-10-16 – 2013-10-17 (×4): 40 mg via ORAL
  Filled 2013-10-16 (×7): qty 1

## 2013-10-16 MED ORDER — ONDANSETRON HCL 4 MG/2ML IJ SOLN: 4.0000 mg | Freq: Once | INTRAMUSCULAR | Status: DC | PRN

## 2013-10-16 MED ORDER — ONDANSETRON HCL 4 MG/2ML IJ SOLN: 4.0000 mg | Freq: Four times a day (QID) | INTRAMUSCULAR | Status: DC | PRN

## 2013-10-16 MED ORDER — OFF THE BEAT BOOK
Freq: Once | Status: AC
  Administered 2013-10-17: 01:00:00
  Filled 2013-10-16: qty 1

## 2013-10-16 MED ORDER — MIDAZOLAM HCL 5 MG/5ML IJ SOLN
INTRAMUSCULAR | Status: DC | PRN
  Administered 2013-10-16: 2 mg via INTRAVENOUS

## 2013-10-16 MED ORDER — WARFARIN SODIUM 7.5 MG PO TABS
7.5000 mg | ORAL_TABLET | Freq: Once | ORAL | Status: AC
  Administered 2013-10-16: 19:00:00 7.5 mg via ORAL
  Filled 2013-10-16: qty 1

## 2013-10-16 MED ORDER — MEPERIDINE HCL 25 MG/ML IJ SOLN: 6.2500 mg | INTRAMUSCULAR | Status: DC | PRN

## 2013-10-16 MED ORDER — SODIUM CHLORIDE 0.9 % IV SOLN: 250.0000 mL | INTRAVENOUS | Status: DC | PRN

## 2013-10-16 MED ORDER — COUMADIN BOOK
Freq: Once | Status: AC
  Administered 2013-10-16: 19:00:00 1
  Filled 2013-10-16: qty 1

## 2013-10-16 MED ORDER — OXYCODONE HCL 5 MG PO TABS: 5.0000 mg | ORAL_TABLET | Freq: Once | ORAL | Status: DC | PRN

## 2013-10-16 MED ORDER — PROPOFOL INFUSION 10 MG/ML OPTIME
INTRAVENOUS | Status: DC | PRN
  Administered 2013-10-16: 25 ug/kg/min via INTRAVENOUS

## 2013-10-16 MED ORDER — SODIUM CHLORIDE 0.9 % IV SOLN: INTRAVENOUS | Status: DC

## 2013-10-16 MED ORDER — FENTANYL CITRATE 0.05 MG/ML IJ SOLN: 25.0000 ug | INTRAMUSCULAR | Status: DC | PRN

## 2013-10-16 MED ORDER — SODIUM CHLORIDE 0.9 % IJ SOLN
3.0000 mL | Freq: Two times a day (BID) | INTRAMUSCULAR | Status: DC
  Administered 2013-10-17 – 2013-10-18 (×4): 3 mL via INTRAVENOUS

## 2013-10-16 MED ORDER — OXYCODONE HCL 5 MG PO TABS: 5.0000 mg | ORAL_TABLET | Freq: Once | ORAL | Status: AC | PRN

## 2013-10-16 NOTE — Progress Notes (Addendum)
Order for sheath removal verified per post procedural orders. Procedure explained to patient and Rt venous access site assessed: level 0,+1 palpable dorsalis pedis and posterior tibial pulses. 6 fr and 9 French Sheaths removed and manual pressure applied for  20 minutes. Pre, peri, & post procedural vitals: HR 75  , RR16, O2 Sat 100% via Bowers at 4 liters,O2 removed and pt O2 sat was 96% RA,  BP 122/86, Pain 0. Distal pulses remained intact after sheath removal. Access site level 0 and dressed with 4X4 gauze and tegaderm.  Christine , RN - 6C confirmed condition of site. Post procedural instructions discussed with return demonstration from patient. Pt was tranferred to [facility] via bed.

## 2013-10-16 NOTE — Progress Notes (Signed)
Patient ID: Walter [name], male   DOB: 1945/04/17, 68 y.o.   MRN: [id number]    SUBJECTIVE:   Underwent attempted TEE and DC-CV Tuesday but unable to pass probe past 35cm. Had barium swallow with ?distal esophageal spasm. EGD yesterday showed no stricture but still not able to pass TEE probe.    Remains in AF with RVR. HR lower but still > 100 at rest. Denies dyspnea.   Scheduled Meds: . cholecalciferol  1,000 Units Oral Daily  . digoxin  0.25 mg Oral Daily  . furosemide  40 mg Oral BID  . insulin aspart  0-15 Units Subcutaneous TID WC  . lisinopril  10 mg Oral Daily  . metoprolol succinate  25 mg Oral Q6H  . midazolam  1 mg Intravenous Once  . omega-3 acid ethyl esters  1 g Oral BID  . pantoprazole  40 mg Oral Daily  . potassium chloride  40 mEq Oral Daily  . rOPINIRole  1 mg Oral QHS  . sodium chloride  3 mL Intravenous Q12H  . Warfarin - Pharmacist Dosing Inpatient   Does not apply q1800   Continuous Infusions: . sodium chloride 10 mL (10/12/13 0626)  . sodium chloride 30 mL/hr at 10/13/13 0700  . sodium chloride    . amiodarone 30 mg/hr (10/16/13 0729)  . heparin 1,700 Units/hr (10/16/13 0400)   PRN Meds:.acetaminophen, albuterol, ALPRAZolam, flavoxATE, HYDROcodone-acetaminophen, hydrocortisone cream, loperamide, morphine injection, ondansetron (ZOFRAN) IV, rOPINIRole, sodium chloride, zolpidem, zolpidem    Filed Vitals:   10/16/13 0209 10/16/13 0400 10/16/13 0536 10/16/13 0602  BP: 117/59 119/64  115/67  Pulse: 97 93    Temp:   97.4 F (36.3 C)   TempSrc:   Oral   Resp: 24 20    Height:      Weight:   247 lb 12.8 oz (112.4 kg)   SpO2: 96% 96%      Intake/Output Summary (Last 24 hours) at 10/16/13 0735 Last data filed at 10/16/13 0700  Gross per 24 hour  Intake 1888.8 ml  Output    500 ml  Net 1388.8 ml    LABS: Basic Metabolic Panel:  Recent Labs  10/14/13 0310 10/16/13 0239  NA 139 137  K 4.2 4.0  CL 98 97  CO2 24 23  GLUCOSE 122* 141*  BUN 22  29*  CREATININE 1.18 1.15  CALCIUM 8.9 8.9   Liver Function Tests: No results found for this basename: AST, ALT, ALKPHOS, BILITOT, PROT, ALBUMIN,  in the last 72 hours No results found for this basename: LIPASE, AMYLASE,  in the last 72 hours CBC:  Recent Labs  10/15/13 0353 10/16/13 0239  WBC 8.2 8.3  HGB 17.0 17.5*  HCT 51.1 52.1*  MCV 98.3 97.4  PLT 196 200   Cardiac Enzymes: No results found for this basename: CKTOTAL, CKMB, CKMBINDEX, TROPONINI,  in the last 72 hours BNP: No components found with this basename: POCBNP,  D-Dimer: No results found for this basename: DDIMER,  in the last 72 hours Hemoglobin A1C: No results found for this basename: HGBA1C,  in the last 72 hours Fasting Lipid Panel: No results found for this basename: CHOL, HDL, LDLCALC, TRIG, CHOLHDL, LDLDIRECT,  in the last 72 hours Thyroid Function Tests: No results found for this basename: TSH, T4TOTAL, FREET3, T3FREE, THYROIDAB,  in the last 72 hours Anemia Panel: No results found for this basename: VITAMINB12, FOLATE, FERRITIN, TIBC, IRON, RETICCTPCT,  in the last 72 hours  RADIOLOGY: Nm Myocar Multi W/spect  W/wall Motion / Ef  10/10/2013   CLINICAL DATA:  68 year old with current history of nonischemic cardiomyopathy and previous nonobstructive coronary artery disease on outside heart catheterization in 2011, presenting with recent onset of atrial fibrillation and rapid ventricular response.  EXAM: MYOCARDIAL IMAGING WITH SPECT (REST AND PHARMACOLOGIC-STRESS)  GATED LEFT VENTRICULAR WALL MOTION STUDY  LEFT VENTRICULAR EJECTION FRACTION  TECHNIQUE: Standard myocardial SPECT imaging was performed after resting intravenous injection of 10 mCi Tc-88m sestamibi. Subsequently, intravenous infusion of Lexiscan was performed under the supervision of the Cardiology staff. At peak effect of the drug, 30 mCi Tc-40m sestamibi was injected intravenously and standard myocardial SPECT imaging was performed. Quantitative  gated imaging was also performed to evaluate left ventricular wall motion, and estimate left ventricular ejection fraction.  COMPARISON:  None.  FINDINGS: Immediate post regadenoson images demonstrate diminished activity in the inferior wall, apex, and inferolateral wall. Activity in the anterior wall is normal. Initial resting images demonstrate similar findings. No evidence of reversibility to suggest ischemia. Findings confirmed by the computer generated polar map.  Gated images demonstrate severe global hypokinesis with essential akinesis in the inferior wall and apex.  Estimated QGS left ventricular ejection fraction measured 18%, with an end-diastolic volume of 025 ml and an end systolic volume of 852 ml.  IMPRESSION: 1. Inferior wall, apical and inferolateral wall MI. No evidence of myocardial ischemia. 2. Severe global hypokinesis with a central akinesis in the inferior wall and apex. 3. Estimated QGS ejection fraction 18%. 4. Left ventricular enlargement.   Electronically Signed   By: Evangeline Dakin M.D.   On: 10/10/2013 12:47   Dg Chest Port 1 View  10/09/2013   CLINICAL DATA:  Shortness of breath. Abdominal pain. Weakness. Hypertension. Coronary artery disease.  EXAM: PORTABLE CHEST - 1 VIEW  COMPARISON:  CT chest 04/16/2013.  Chest x-ray 02/11/2013.  FINDINGS: Mediastinum and hilar structures are normal. Cardiomegaly. Normal pulmonary vascularity. Calcified pleural plaques are present consistent with prior asbestos exposure. No pleural effusion or pneumothorax. No acute bony abnormality.  IMPRESSION: 1. Cardiomegaly, no CHF. 2. Calcified pleural plaques. This is consistent with prior asbestos exposure. No change from prior chest CT of 04/16/2013 and chest x-ray of 02/11/2013. No acute pulmonary disease.   Electronically Signed   By: Marcello Moores  Register   On: 10/09/2013 13:52   Filed Vitals:   10/16/13 0209 10/16/13 0400 10/16/13 0536 10/16/13 0602  BP: 117/59 119/64  115/67  Pulse: 97 93    Temp:    97.4 F (36.3 C)   TempSrc:   Oral   Resp: 24 20    Height:      Weight:   247 lb 12.8 oz (112.4 kg)   SpO2: 96% 96%      PHYSICAL EXAM General: NAD Neck: JVP 8 cm, no thyromegaly or thyroid nodule.  Lungs: Crackles at bases bilaterally. CV: Nondisplaced PMI.  Heart tachy irregular S1/S2, no S3/S4, no murmur.  1+ ankle edema.   Abdomen: Soft, nontender, no hepatosplenomegaly, no distention.  Neurologic: Alert and oriented x 3.  Psych: Normal affect. Extremities: No clubbing or cyanosis.   TELEMETRY: Reviewed telemetry pt in atrial fibrillation, HR 130s  ASSESSMENT AND PLAN: 68 yo with history of LBBB and mild cardiomyopathy presented with atrial fibrillation/RVR and acute systolic CHF (EF 77%->82%).   1. Atrial fibrillation: RVR, rate still high, 100s-110s at rest.  On amiodarone gtt and heparin gtt.  Not sure how long he has been in afib, likely several weeks. Given difficult rate  control and probable tachy-mediated CMP, would like to get him out of atrial fibrillation. Unable to do TEE. - Discussed with [name], given difficulty with rate control and presumed tachy-mediated CMP, will plan intracardiac echo evaluation today by Dr. Rayann Heman.  If no LA thrombus noted, will cardiovert.  - Heparin gtt bridged to warfarin to allow immediate cardioversion after LAA thrombus ruled out.  Will likely switch him to NOAC a month after DCCV.  - Continue Toprol XL 25 mg q6 hrs. Also on digoxin. - Will need to keep in NSR, will use amiodarone.  2. Acute on chronic systolic CHF: EF down to 73% from 50% in 3/15.  LHC/RHC 7/27 with mildly elevated filling pressures and mild nonobstructive CAD. 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  - Continue lisinopril and Toprol XL as above.   - Lasix 40 mg po bid for now. 3. Esophageal spasm: Dr. Carlean Purl reviewed barium swallow feels it is likely spasm. EGD showed no stricture but unable to pass TEE probe.   4. Urinary retention: Can add Flomax if needed.    Loralie Champagne MD 10/16/2013 7:35 AM

## 2013-10-16 NOTE — Anesthesia Procedure Notes (Signed)
Procedure Name: MAC Date/Time: 10/16/2013 12:55 PM Performed by: Antonietta Breach Pre-anesthesia Checklist: Patient identified, Timeout performed, Emergency Drugs available, Suction available and Patient being monitored Patient Re-evaluated:Patient Re-evaluated prior to inductionOxygen Delivery Method: Nasal cannula Placement Confirmation: positive ETCO2

## 2013-10-16 NOTE — Progress Notes (Signed)
PHARMACY NOTE  Pharmacy Consult :  68 y.o. male is currently to restart Heparin 4 hours after sheath pulled.  S/P DCCV in cath lab.  Dosing Wt :  102 kg  Hematology :  Recent Labs  10/14/13 0310 10/14/13 1645 10/15/13 0353 10/15/13 2050 10/16/13 0239 10/16/13 1151  HGB 16.9  --  17.0  --  17.5*  --   HCT 50.0  --  51.1  --  52.1*  --   PLT 205  --  196  --  200  --   LABPROT 14.8  --  15.3*  --  14.6  --   INR 1.16  --  1.21  --  1.14  --   HEPARINUNFRC 0.74* 0.85* 0.58 <0.10* 0.19* 0.49  CREATININE 1.18  --   --   --  1.15  --     Current Medication[s] Include:  Scheduled:  Scheduled:  . cholecalciferol  1,000 Units Oral Daily  . digoxin  0.25 mg Oral Daily  . furosemide  40 mg Oral BID  . insulin aspart  0-15 Units Subcutaneous TID WC  . lisinopril  10 mg Oral Daily  . omega-3 acid ethyl esters  1 g Oral BID  . pantoprazole  40 mg Oral Daily  . potassium chloride  40 mEq Oral Daily  . rOPINIRole  1 mg Oral QHS  . sodium chloride  3 mL Intravenous Q12H  . Warfarin - Pharmacist Dosing Inpatient   Does not apply q1800   Infusion[s]: Infusions:  . sodium chloride    . amiodarone 30 mg/hr (10/16/13 1241)  . heparin Stopped (10/16/13 1228)   Assessment :  Last Heparin level at 1700 units/hr reported 0.49 units/ml, within the therapeutic range  No evidence of bleeding complications observed.  Goal :  Heparin goal is Heparin level 0.3-0.7 units/ml  Plan : 1. Heparin will be restarted 4 hours after sheath is pulled.  Will restart at previous rate of 1700 units/hr.   The next Heparin Level in 6 hours 2. Daily Heparin level, INR, CBC, and Monitor for bleeding complications.  Follow Plat[place] counts.  Lailana Shira, Craig Guess,  Pharm.D  10/16/2013  4:21 PM

## 2013-10-16 NOTE — Progress Notes (Signed)
UR Completed.  Vergie Living [phone] 10/16/2013

## 2013-10-16 NOTE — Progress Notes (Signed)
10/16/2013 1242 to cath via anesthesia. Walter [name], Carolynn Comment

## 2013-10-16 NOTE — Anesthesia Preprocedure Evaluation (Addendum)
Anesthesia Evaluation  Patient identified by MRN, date of birth, ID band Patient awake    Reviewed: Allergy & Precautions, H&P , NPO status , Patient's Chart, lab work & pertinent test results  History of Anesthesia Complications Negative for: history of anesthetic complications  Airway Mallampati: II TM Distance: >3 FB Neck ROM: full    Dental  (+) Teeth Intact, Dental Advisory Given   Pulmonary former smoker,          Cardiovascular hypertension, Pt. on medications + CAD and + Past MI + dysrhythmias Atrial Fibrillation     Neuro/Psych  Neuromuscular disease    GI/Hepatic Neg liver ROS, GERD-  Medicated and Controlled,  Endo/Other  diabetes, Type 2, Oral Hypoglycemic Agentsobese  Renal/GU negative Renal ROS     Musculoskeletal   Abdominal   Peds  Hematology   Anesthesia Other Findings Mild non-ischemic cardiomyopathy EF 20% Left BBB Hx DJD   Reproductive/Obstetrics                       Anesthesia Physical Anesthesia Plan  ASA: III  Anesthesia Plan: MAC   Post-op Pain Management:    Induction: Intravenous  Airway Management Planned: Simple Face Mask  Additional Equipment:   Intra-op Plan:   Post-operative Plan:   Informed Consent: I have reviewed the patients History and Physical, chart, labs and discussed the procedure including the risks, benefits and alternatives for the proposed anesthesia with the patient or authorized representative who has indicated his/her understanding and acceptance.   Dental advisory given  Plan Discussed with: CRNA, Anesthesiologist and Surgeon  Anesthesia Plan Comments:        Anesthesia Quick Evaluation

## 2013-10-16 NOTE — Anesthesia Postprocedure Evaluation (Signed)
Anesthesia Post Note  Patient: Walter [name]  Procedure(s) Performed: Procedure(s) (LRB): ESOPHAGOGASTRODUODENOSCOPY (EGD) (N/A)  Anesthesia type: general  Patient location: PACU  Post pain: Pain level controlled  Post assessment: Patient's Cardiovascular Status Stable  Last Vitals:  Filed Vitals:   10/16/13 0536  BP:   Pulse:   Temp: 36.3 C  Resp:     Post vital signs: Reviewed and stable  Level of consciousness: sedated  Complications: No apparent anesthesia complications

## 2013-10-16 NOTE — Plan of Care (Signed)
Problem: Phase II Progression Outcomes Goal: Anticoagulation Therapy per MD order Outcome: Completed/Met Date Met:  10/16/13 Heparin drip Goal: Discharge plan established Outcome: Completed/Met Date Met:  10/16/13 Home with wife upon discharge

## 2013-10-16 NOTE — H&P (View-Only) (Signed)
Patient ID: Walter [name], male   DOB: 12-May-1945, 68 y.o.   MRN: [id number]    SUBJECTIVE:   Underwent attempted TEE and DC-CV Tuesday but unable to pass probe past 35cm. Had barium swallow with ?distal esophageal spasm. EGD yesterday showed no stricture but still not able to pass TEE probe.    Remains in AF with RVR. HR lower but still > 100 at rest. Denies dyspnea.   Scheduled Meds: . cholecalciferol  1,000 Units Oral Daily  . digoxin  0.25 mg Oral Daily  . furosemide  40 mg Oral BID  . insulin aspart  0-15 Units Subcutaneous TID WC  . lisinopril  10 mg Oral Daily  . metoprolol succinate  25 mg Oral Q6H  . midazolam  1 mg Intravenous Once  . omega-3 acid ethyl esters  1 g Oral BID  . pantoprazole  40 mg Oral Daily  . potassium chloride  40 mEq Oral Daily  . rOPINIRole  1 mg Oral QHS  . sodium chloride  3 mL Intravenous Q12H  . Warfarin - Pharmacist Dosing Inpatient   Does not apply q1800   Continuous Infusions: . sodium chloride 10 mL (10/12/13 0626)  . sodium chloride 30 mL/hr at 10/13/13 0700  . sodium chloride    . amiodarone 30 mg/hr (10/16/13 0729)  . heparin 1,700 Units/hr (10/16/13 0400)   PRN Meds:.acetaminophen, albuterol, ALPRAZolam, flavoxATE, HYDROcodone-acetaminophen, hydrocortisone cream, loperamide, morphine injection, ondansetron (ZOFRAN) IV, rOPINIRole, sodium chloride, zolpidem, zolpidem    Filed Vitals:   10/16/13 0209 10/16/13 0400 10/16/13 0536 10/16/13 0602  BP: 117/59 119/64  115/67  Pulse: 97 93    Temp:   97.4 F (36.3 C)   TempSrc:   Oral   Resp: 24 20    Height:      Weight:   247 lb 12.8 oz (112.4 kg)   SpO2: 96% 96%      Intake/Output Summary (Last 24 hours) at 10/16/13 0735 Last data filed at 10/16/13 0700  Gross per 24 hour  Intake 1888.8 ml  Output    500 ml  Net 1388.8 ml    LABS: Basic Metabolic Panel:  Recent Labs  10/14/13 0310 10/16/13 0239  NA 139 137  K 4.2 4.0  CL 98 97  CO2 24 23  GLUCOSE 122* 141*  BUN 22  29*  CREATININE 1.18 1.15  CALCIUM 8.9 8.9   Liver Function Tests: No results found for this basename: AST, ALT, ALKPHOS, BILITOT, PROT, ALBUMIN,  in the last 72 hours No results found for this basename: LIPASE, AMYLASE,  in the last 72 hours CBC:  Recent Labs  10/15/13 0353 10/16/13 0239  WBC 8.2 8.3  HGB 17.0 17.5*  HCT 51.1 52.1*  MCV 98.3 97.4  PLT 196 200   Cardiac Enzymes: No results found for this basename: CKTOTAL, CKMB, CKMBINDEX, TROPONINI,  in the last 72 hours BNP: No components found with this basename: POCBNP,  D-Dimer: No results found for this basename: DDIMER,  in the last 72 hours Hemoglobin A1C: No results found for this basename: HGBA1C,  in the last 72 hours Fasting Lipid Panel: No results found for this basename: CHOL, HDL, LDLCALC, TRIG, CHOLHDL, LDLDIRECT,  in the last 72 hours Thyroid Function Tests: No results found for this basename: TSH, T4TOTAL, FREET3, T3FREE, THYROIDAB,  in the last 72 hours Anemia Panel: No results found for this basename: VITAMINB12, FOLATE, FERRITIN, TIBC, IRON, RETICCTPCT,  in the last 72 hours  RADIOLOGY: Nm Myocar Multi W/spect  W/wall Motion / Ef  10/10/2013   CLINICAL DATA:  68 year old with current history of nonischemic cardiomyopathy and previous nonobstructive coronary artery disease on outside heart catheterization in 2011, presenting with recent onset of atrial fibrillation and rapid ventricular response.  EXAM: MYOCARDIAL IMAGING WITH SPECT (REST AND PHARMACOLOGIC-STRESS)  GATED LEFT VENTRICULAR WALL MOTION STUDY  LEFT VENTRICULAR EJECTION FRACTION  TECHNIQUE: Standard myocardial SPECT imaging was performed after resting intravenous injection of 10 mCi Tc-31m sestamibi. Subsequently, intravenous infusion of Lexiscan was performed under the supervision of the Cardiology staff. At peak effect of the drug, 30 mCi Tc-50m sestamibi was injected intravenously and standard myocardial SPECT imaging was performed. Quantitative  gated imaging was also performed to evaluate left ventricular wall motion, and estimate left ventricular ejection fraction.  COMPARISON:  None.  FINDINGS: Immediate post regadenoson images demonstrate diminished activity in the inferior wall, apex, and inferolateral wall. Activity in the anterior wall is normal. Initial resting images demonstrate similar findings. No evidence of reversibility to suggest ischemia. Findings confirmed by the computer generated polar map.  Gated images demonstrate severe global hypokinesis with essential akinesis in the inferior wall and apex.  Estimated QGS left ventricular ejection fraction measured 18%, with an end-diastolic volume of 536 ml and an end systolic volume of 644 ml.  IMPRESSION: 1. Inferior wall, apical and inferolateral wall MI. No evidence of myocardial ischemia. 2. Severe global hypokinesis with a central akinesis in the inferior wall and apex. 3. Estimated QGS ejection fraction 18%. 4. Left ventricular enlargement.   Electronically Signed   By: Evangeline Dakin M.D.   On: 10/10/2013 12:47   Dg Chest Port 1 View  10/09/2013   CLINICAL DATA:  Shortness of breath. Abdominal pain. Weakness. Hypertension. Coronary artery disease.  EXAM: PORTABLE CHEST - 1 VIEW  COMPARISON:  CT chest 04/16/2013.  Chest x-ray 02/11/2013.  FINDINGS: Mediastinum and hilar structures are normal. Cardiomegaly. Normal pulmonary vascularity. Calcified pleural plaques are present consistent with prior asbestos exposure. No pleural effusion or pneumothorax. No acute bony abnormality.  IMPRESSION: 1. Cardiomegaly, no CHF. 2. Calcified pleural plaques. This is consistent with prior asbestos exposure. No change from prior chest CT of 04/16/2013 and chest x-ray of 02/11/2013. No acute pulmonary disease.   Electronically Signed   By: Marcello Moores  Register   On: 10/09/2013 13:52   Filed Vitals:   10/16/13 0209 10/16/13 0400 10/16/13 0536 10/16/13 0602  BP: 117/59 119/64  115/67  Pulse: 97 93    Temp:    97.4 F (36.3 C)   TempSrc:   Oral   Resp: 24 20    Height:      Weight:   247 lb 12.8 oz (112.4 kg)   SpO2: 96% 96%      PHYSICAL EXAM General: NAD Neck: JVP 8 cm, no thyromegaly or thyroid nodule.  Lungs: Crackles at bases bilaterally. CV: Nondisplaced PMI.  Heart tachy irregular S1/S2, no S3/S4, no murmur.  1+ ankle edema.   Abdomen: Soft, nontender, no hepatosplenomegaly, no distention.  Neurologic: Alert and oriented x 3.  Psych: Normal affect. Extremities: No clubbing or cyanosis.   TELEMETRY: Reviewed telemetry pt in atrial fibrillation, HR 130s  ASSESSMENT AND PLAN: 68 yo with history of LBBB and mild cardiomyopathy presented with atrial fibrillation/RVR and acute systolic CHF (EF 03%->47%).   1. Atrial fibrillation: RVR, rate still high, 100s-110s at rest.  On amiodarone gtt and heparin gtt.  Not sure how long he has been in afib, likely several weeks. Given difficult rate  control and probable tachy-mediated CMP, would like to get him out of atrial fibrillation. Unable to do TEE. - Discussed with [name], given difficulty with rate control and presumed tachy-mediated CMP, will plan intracardiac echo evaluation today by Dr. Rayann Heman.  If no LA thrombus noted, will cardiovert.  - Heparin gtt bridged to warfarin to allow immediate cardioversion after LAA thrombus ruled out.  Will likely switch him to NOAC a month after DCCV.  - Continue Toprol XL 25 mg q6 hrs. Also on digoxin. - Will need to keep in NSR, will use amiodarone.  2. Acute on chronic systolic CHF: EF down to 24% from 50% in 3/15.  LHC/RHC 7/27 with mildly elevated filling pressures and mild nonobstructive CAD. 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  - Continue lisinopril and Toprol XL as above.   - Lasix 40 mg po bid for now. 3. Esophageal spasm: Dr. Carlean Purl reviewed barium swallow feels it is likely spasm. EGD showed no stricture but unable to pass TEE probe.   4. Urinary retention: Can add Flomax if needed.    Loralie Champagne MD 10/16/2013 7:35 AM

## 2013-10-16 NOTE — Progress Notes (Addendum)
Now back in sinus rhythm s/p cardioversion today.  BP is much improved.  Restart heparin drip Continue coumadin (Goal INR 2-3) Would plan to transition from IV amiodarone to PO amiodarone tomorrow Also restart beta blocker tomorrow if rhythm is stable.    Continue medical management of CHF over the weekend  Possible discharge Sunday or Monday but would require lovenox likely if INR is not therapeutic  General cardiology to follow this weekend.  Electrophysiology team to see as needed  Please call with questions.

## 2013-10-16 NOTE — Progress Notes (Signed)
ANTICOAGULATION CONSULT NOTE - Follow Up Consult  Pharmacy Consult for heparin Indication: atrial fibrillation  Labs:  Recent Labs  10/14/13 0310  10/15/13 0353 10/15/13 2050 10/16/13 0239  HGB 16.9  --  17.0  --  17.5*  HCT 50.0  --  51.1  --  52.1*  PLT 205  --  196  --  200  LABPROT 14.8  --  15.3*  --   --   INR 1.16  --  1.21  --   --   HEPARINUNFRC 0.74*  < > 0.58 <0.10* 0.19*  CREATININE 1.18  --   --   --   --   < > = values in this interval not displayed.   Assessment: 68yo male subtherapeutic on heparin after resumed for Afib.  Goal of Therapy:  Heparin level 0.3-0.7 units/ml   Plan:  Will increase heparin gtt slightly to 1700 units/hr and check level in 6hr.  Wynona Neat, PharmD, BCPS  10/16/2013,3:54 AM

## 2013-10-16 NOTE — Interval H&P Note (Signed)
History and Physical Interval Note:  Refer to my consult from yesterday. Dr Aundra Dubin, Dr Acie Fredrickson and myself has discussed this patient at length.  He is not presently a candidate for TEE.  He also remains with afib with RVR and acute decompensated CHF secondary to this afib with RVR.  He really needs sinus rhythm at this time.  We are limited in further rate control by hypotension. I have discussed intracardiac echo guided cardioversion with the patient and his wife today.  They understand that this is not standard practice but in his situation, we all believe this is the safest way to proceed with our pursuit of sinus rhythm.  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  I will therefore plan to perform EPS with His recording and have a catheter available for ventricular backup pacing if required.  They understand that at some point he may require PPM.  They understand that risks include but are not limited to stroke, bleeding, vascular damage, tamponade, perforation, damage to the heart and other structures, AV block requiring pacemaker, worsening renal function, and death. The patient understands these risk and wishes to proceed.  We will therefore proceed with EPS and intracardiac echo guided electrical cardioversion at the next available time.      10/16/2013 10:40 AM  Walter [name]  has presented today for surgery, with the diagnosis of afib  The various methods of treatment have been discussed with the patient and family. After consideration of risks, benefits and other options for treatment, the patient has consented to  Procedure(s): ELECTROPHYSIOLOGY STUDY (N/A) CARDIOVERSION (N/A)  And intracardiac echocardiography as a surgical intervention .  The patient's history has been reviewed, patient examined, no change in status, stable for surgery.  I have reviewed the patient's chart and labs.  Questions were answered to the patient's satisfaction.     Thompson Grayer

## 2013-10-16 NOTE — Brief Op Note (Signed)
10/09/2013 - 10/16/2013  2:55 PM  PATIENT:  Walter [name]  68 y.o. male  PRE-OPERATIVE DIAGNOSIS:  afib  POST-OPERATIVE DIAGNOSIS:  same  PROCEDURE:  Procedure(s): ELECTROPHYSIOLOGY STUDY (N/A) CARDIOVERSION (N/A)  SURGEON:  Surgeon(s) and Role:    * Thompson Grayer, MD - Primary   ASSISTANTS:   Dr Acie Fredrickson  ANESTHESIA:   IV sedation  EBL:  Total I/O In: 529.8 [I.V.:529.8] Out: 425 [Urine:425]  BLOOD ADMINISTERED:none  DRAINS: none   LOCAL MEDICATIONS USED:  LIDOCAINE   SPECIMEN:  No Specimen  DISPOSITION OF SPECIMEN:  N/A  COUNTS:  YES  TOURNIQUET:  * No tourniquets in log *  DICTATION: .Other Dictation: Dictation Number see epic  PLAN OF CARE: to telemetry  PATIENT DISPOSITION:  PACU stable condition   Delay start of Pharmacological VTE agent (>24hrs) due to surgical blood loss or risk of bleeding: no

## 2013-10-16 NOTE — Transfer of Care (Signed)
Immediate Anesthesia Transfer of Care Note  Patient: Walter [name]  Procedure(s) Performed: Procedure(s): ELECTROPHYSIOLOGY STUDY (N/A) CARDIOVERSION (N/A)  Patient Location: PACU  Anesthesia Type:MAC  Level of Consciousness: awake, alert  and oriented  Airway & Oxygen Therapy: Patient Spontanous Breathing and Patient connected to nasal cannula oxygen  Post-op Assessment: Report given to PACU RN and Post -op Vital signs reviewed and stable  Post vital signs: Reviewed and stable  Complications: No apparent anesthesia complications

## 2013-10-17 DIAGNOSIS — Z79899 Other long term (current) drug therapy: Secondary | ICD-10-CM

## 2013-10-17 LAB — BASIC METABOLIC PANEL
Anion gap: 15 (ref 5–15)
BUN: 23 mg/dL (ref 6–23)
CHLORIDE: 98 meq/L (ref 96–112)
CO2: 27 mEq/L (ref 19–32)
Calcium: 9 mg/dL (ref 8.4–10.5)
Creatinine, Ser: 1.15 mg/dL (ref 0.50–1.35)
GFR calc non Af Amer: 63 mL/min — ABNORMAL LOW (ref >90)
GFR, EST AFRICAN AMERICAN: 73 mL/min — AB (ref >90)
GLUCOSE: 111 mg/dL — AB (ref 70–99)
POTASSIUM: 4.1 meq/L (ref 3.7–5.3)
Sodium: 140 mEq/L (ref 137–147)

## 2013-10-17 LAB — GLUCOSE, CAPILLARY
GLUCOSE-CAPILLARY: 141 mg/dL — AB (ref 70–99)
Glucose-Capillary: 126 mg/dL — ABNORMAL HIGH (ref 70–99)
Glucose-Capillary: 99 mg/dL (ref 70–99)
Glucose-Capillary: 123 mg/dL — ABNORMAL HIGH (ref 70–99)

## 2013-10-17 LAB — URINALYSIS, ROUTINE W REFLEX MICROSCOPIC
Bilirubin Urine: NEGATIVE
Glucose, UA: NEGATIVE mg/dL
HGB URINE DIPSTICK: NEGATIVE
KETONES UR: NEGATIVE mg/dL
Nitrite: NEGATIVE
PROTEIN: NEGATIVE mg/dL
Specific Gravity, Urine: 1.013 (ref 1.005–1.030)
Urobilinogen, UA: 1 mg/dL (ref 0.0–1.0)
pH: 5 (ref 5.0–8.0)

## 2013-10-17 LAB — CBC
HCT: 53.2 % — ABNORMAL HIGH (ref 39.0–52.0)
HEMOGLOBIN: 17.8 g/dL — AB (ref 13.0–17.0)
MCH: 32.7 pg (ref 26.0–34.0)
MCHC: 33.5 g/dL (ref 30.0–36.0)
MCV: 97.6 fL (ref 78.0–100.0)
Platelets: 187 10*3/uL (ref 150–400)
RBC: 5.45 MIL/uL (ref 4.22–5.81)
RDW: 14.8 % (ref 11.5–15.5)
WBC: 7.7 10*3/uL (ref 4.0–10.5)

## 2013-10-17 LAB — HEPARIN LEVEL (UNFRACTIONATED)
HEPARIN UNFRACTIONATED: 0.19 [IU]/mL — AB (ref 0.30–0.70)
HEPARIN UNFRACTIONATED: 0.34 [IU]/mL (ref 0.30–0.70)

## 2013-10-17 LAB — PROTIME-INR
INR: 1.42 (ref 0.00–1.49)
Prothrombin Time: 17.4 seconds — ABNORMAL HIGH (ref 11.6–15.2)

## 2013-10-17 LAB — URINE MICROSCOPIC-ADD ON

## 2013-10-17 MED ORDER — METOPROLOL SUCCINATE ER 25 MG PO TB24
25.0000 mg | ORAL_TABLET | Freq: Every day | ORAL | Status: DC
  Administered 2013-10-17 – 2013-10-19 (×3): 25 mg via ORAL
  Filled 2013-10-17 (×3): qty 1

## 2013-10-17 MED ORDER — COUMADIN BOOK
Freq: Once | Status: AC
  Administered 2013-10-17: 12:00:00
  Filled 2013-10-17: qty 1

## 2013-10-17 MED ORDER — AMIODARONE HCL 200 MG PO TABS
400.0000 mg | ORAL_TABLET | Freq: Two times a day (BID) | ORAL | Status: DC
  Administered 2013-10-17 – 2013-10-19 (×5): 400 mg via ORAL
  Filled 2013-10-17 (×7): qty 2

## 2013-10-17 MED ORDER — WARFARIN SODIUM 7.5 MG PO TABS
7.5000 mg | ORAL_TABLET | Freq: Once | ORAL | Status: AC
  Administered 2013-10-17: 7.5 mg via ORAL
  Filled 2013-10-17: qty 1

## 2013-10-17 MED ORDER — PHENAZOPYRIDINE HCL 100 MG PO TABS
100.0000 mg | ORAL_TABLET | Freq: Three times a day (TID) | ORAL | Status: DC
  Administered 2013-10-17 – 2013-10-19 (×6): 100 mg via ORAL
  Filled 2013-10-17 (×8): qty 1

## 2013-10-17 MED ORDER — WARFARIN VIDEO
Freq: Once | Status: AC
  Administered 2013-10-17: 11:00:00

## 2013-10-17 NOTE — Progress Notes (Signed)
Patient has arrived to unit. Patient alert and oriented x4 and has no complaints at this time. Patient placed on tele, CCMD notified. Will continue to monitor.

## 2013-10-17 NOTE — Progress Notes (Signed)
The patient was seen and examined, and I agree with the assessment and plan as documented by K. Lawrence NP, with modifications as noted below. Pt feeling well and says "My breathing is 70% better".  1. Atrial fibrillation: Now in sinus rhythm after cardioversion with intracardiac echo which ruled out LAA thrombus by Dr. Rayann Heman. Switch IV amiodarone to oral today. Start Toprol-XL 25 mg daily. Pharmacy to dose warfarin with heparin for bridging. Also on digoxin and lisinopril. Has purported tachycardia-mediated cardiomyopathy. Possibly switch to target-specific anticoagulant after one month.  2. Acute on chronic systolic CHF: EF down to 28% from 50% in 3/15. LHC/RHC 7/27 with mildly elevated filling pressures and mild nonobstructive CAD. 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  - Continue lisinopril and restartToprol XL as above.  - Lasix 40 mg po bid for now. 1.6 liters output in last 24 hours.

## 2013-10-17 NOTE — Progress Notes (Signed)
Primary Cardiologist: Loralie Champagne MD  Subjective:   Patient admitted with afib RVR placed on dilt gtt. 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   Feels well. No complaints of chest pain, DOE, or weakness. Very complimentary of [place]. Complaints of dysuria.  Objective:   Temp:  [97.5 F (36.4 C)-98.4 F (36.9 C)] 97.6 F (36.4 C) (08/01 0730) Pulse Rate:  [64-77] 65 (08/01 0730) Resp:  [9-20] 18 (08/01 0730) BP: (95-145)/(54-112) 134/54 mmHg (08/01 0800) SpO2:  [92 %-100 %] 96 % (08/01 0730) Last BM Date: 10/16/13  Filed Weights   10/14/13 0500 10/15/13 0500 10/16/13 0536  Weight: 249 lb 5.4 oz (113.1 kg) 247 lb 5.7 oz (112.2 kg) 247 lb 12.8 oz (112.4 kg)    Intake/Output Summary (Last 24 hours) at 10/17/13 0906 Last data filed at 10/17/13 0857  Gross per 24 hour  Intake 820.85 ml  Output   1925 ml  Net -1104.15 ml    Telemetry: NSR with LBBB, occasional PAC HR in the 70;'s.   Exam:  General: No acute distress.  HEENT: Conjunctiva and lids normal, oropharynx clear.  Lungs: Clear to auscultation, nonlabored.  Cardiac: No elevated JVP or bruits. RRR, no gallop or rub.   Abdomen: Normoactive bowel sounds, nontender, nondistended.  Extremities: No pitting edema, distal pulses full.  Neuropsychiatric: Alert and oriented x3, affect appropriate.  Echocardiogram 10/10/2013 Left ventricle: The cavity size was at the upper limits of normal. Wall thickness was increased in a pattern of mild LVH. Systolic function was severely reduced. The estimated ejection fraction was 20%. Diffuse hypokinesis. Probable akinesis of the basal-midinferolateral and inferior myocardium. The study is not technically sufficient to allow evaluation of LV diastolic function. - Ventricular  septum: Septal motion showed abnormal function and dyssynergy. - Aortic valve: Mildly calcified annulus. Trileaflet. There was no significant regurgitation. - Mitral valve: Calcified annulus. There was trivial regurgitation. - Left atrium: The atrium was moderately dilated. - Right ventricle: Systolic function was mildly to moderately reduced. - Right atrium: The atrium was moderately dilated. Central venous pressure (est): 15 mm Hg. - Tricuspid valve: There was trivial regurgitation. - Pulmonary arteries: PA peak pressure: 35 mm Hg (S). - Pericardium, extracardiac: There was no pericardial effusion.   Lab Results:  Basic Metabolic Panel:  Recent Labs Lab 10/10/13 2238 10/12/13 0030  10/14/13 0310 10/16/13 0239 10/17/13 0330  NA 137 139  < > 139 137 140  K 3.8 3.5*  < > 4.2 4.0 4.1  CL 100 98  < > 98 97 98  CO2 23 26  < > 24 23 27   GLUCOSE 116* 132*  < > 122* 141* 111*  BUN 20 12  < > 22 29* 23  CREATININE 0.95 1.02  < > 1.18 1.15 1.15  CALCIUM 8.7 8.7  < > 8.9 8.9 9.0  MG  --  1.6  --   --   --   --   < > = values in this interval not displayed.   CBC:  Recent Labs Lab 10/15/13 0353 10/16/13 0239 10/17/13 0330  WBC 8.2 8.3 7.7  HGB 17.0 17.5* 17.8*  HCT 51.1 52.1* 53.2*  MCV 98.3 97.4 97.6  PLT 196 200 187   BNP:  Recent Labs  10/09/13 1845  PROBNP 1484.0*  Coagulation:  Recent Labs Lab 10/15/13 0353 10/16/13 0239 10/17/13 0741  INR 1.21 1.14 1.42    ECG:SR with LBBB, sinus arhythmia    Medications:   Scheduled Medications: . cholecalciferol  1,000 Units Oral Daily  . digoxin  0.25 mg Oral Daily  . furosemide  40 mg Oral BID  . insulin aspart  0-15 Units Subcutaneous TID WC  . lisinopril  10 mg Oral Daily  . omega-3 acid ethyl esters  1 g Oral BID  . pantoprazole  40 mg Oral Daily  . potassium chloride  40 mEq Oral Daily  . rOPINIRole  1 mg Oral QHS  . sodium chloride  3 mL Intravenous Q12H  . warfarin   Does not apply Once  .  Warfarin - Pharmacist Dosing Inpatient   Does not apply q1800     Infusions: . sodium chloride 20 mL/hr (10/16/13 1615)  . amiodarone 30 mg/hr (10/17/13 0600)  . heparin 1,700 Units/hr (10/17/13 0600)     PRN Medications:  sodium chloride, acetaminophen, albuterol, ALPRAZolam, fentaNYL, flavoxATE, HYDROcodone-acetaminophen, hydrocortisone cream, HYDROmorphone (DILAUDID) injection, loperamide, ondansetron (ZOFRAN) IV, ondansetron (ZOFRAN) IV, rOPINIRole, sodium chloride, zolpidem   Assessment and Plan:   1.Atrial fib with RVR: Remains in NSR with LBBB. Will transition to PO amiodarone 400 mg BID, continue digoxin, and place him on Toprol XL 25 mg daily. He will be moved to telemetry and asked to ambulate, with close monitoring of his HR. He will continue heparin per pharmacy with transition to po coumadin, INR is 1.42 today.   2.Systolic Dysfunction: Most recent echo demonstrated EF of 25% in the setting of atrial fib. Will repeat echo in 3 months on optimal medical therapy. Continue lasix, lisinopril, and dig.   3. Chronic LBBB  4. Diabetes  5. Hypertension: Monitor response to addition of Toprol XL   6. Dysuria: Check UA   Phill Myron. Carmyn Hamm NP  10/17/2013, 9:06 AM

## 2013-10-17 NOTE — Progress Notes (Signed)
ANTICOAGULATION CONSULT NOTE - Follow Up Consult  Pharmacy Consult for heparin Indication: atrial fibrillation  Labs:  Recent Labs  10/14/13 0310  10/15/13 0353  10/16/13 0239 10/16/13 1151 10/17/13 0111  HGB 16.9  --  17.0  --  17.5*  --   --   HCT 50.0  --  51.1  --  52.1*  --   --   PLT 205  --  196  --  200  --   --   LABPROT 14.8  --  15.3*  --  14.6  --   --   INR 1.16  --  1.21  --  1.14  --   --   HEPARINUNFRC 0.74*  < > 0.58  < > 0.19* 0.49 0.19*  CREATININE 1.18  --   --   --  1.15  --   --   < > = values in this interval not displayed.   Assessment/Plan:  68yo male subtherapeutic on heparin after resumed s/p DCCV, now in NSR; pt had been accumulating heparin prior to being turned off requiring multiple rate decreases and was most recently therapeutic at current rate; will hold off on adjusting and check another level to see if pt needs more time to reach therapeutic level.  Wynona Neat, PharmD, BCPS  10/17/2013,1:40 AM

## 2013-10-17 NOTE — Progress Notes (Signed)
See my note

## 2013-10-17 NOTE — Progress Notes (Signed)
ANTICOAGULATION CONSULT NOTE - Follow Up Consult  Pharmacy Consult for Heparin Indication: resumed post-cath  Allergies  Allergen Reactions  . Statins     Myalgias.  Can only take Altoprev     Patient Measurements: Height: 6' (182.9 cm) Weight: 247 lb 12.8 oz (112.4 kg) IBW/kg (Calculated) : 77.6 Heparin Dosing Weight: 102 kg  Vital Signs: Temp: 97.6 F (36.4 C) (08/01 0730) Temp src: Oral (08/01 0730) BP: 134/54 mmHg (08/01 0800) Pulse Rate: 65 (08/01 0730)  Labs:  Recent Labs  10/15/13 0353  10/16/13 0239 10/16/13 1151 10/17/13 0111 10/17/13 0330 10/17/13 0741  HGB 17.0  --  17.5*  --   --  17.8*  --   HCT 51.1  --  52.1*  --   --  53.2*  --   PLT 196  --  200  --   --  187  --   LABPROT 15.3*  --  14.6  --   --   --  17.4*  INR 1.21  --  1.14  --   --   --  1.42  HEPARINUNFRC 0.58  < > 0.19* 0.49 0.19*  --  0.34  CREATININE  --   --  1.15  --   --  1.15  --   < > = values in this interval not displayed.  Estimated Creatinine Clearance: 78.5 ml/min (by C-G formula based on Cr of 1.15).   Assessment: Abd pain, diarrhea. Presented in AFib  68 yo m who presented on 7/24 with a fib with RVR. Will likely switch him to NOAC a month after DCCV 7/31. Patient has h/o factor V leiden but not on anticoag PTA.CHADSVASC score is 4. Heparin level 0.34 in goal. INR up to 1.42 (dose held 7/29 for EGD)  Cardiovascular- HTN, HLP, EF 20% (prev 55-60%) AFib. Meds: amio po, digoxin, po lasix, lisinopril, Toprox, lovaza, K+  Endocrinology: DM2 (A1c 6.6) on SSI, T4 ok  GI / Nutrition: PPI, EGD completed no issues noted, but TEE failed again  Neuro: requip  Nephrology: Scr 1.15. K+4.1 ok.  Heme/onc: see AC  PTA Med Issues: lovastatin, testosterone inj, celebrex  Best Pract: hep gtt, warfarin  Goal of Therapy:  Heparin level 0.3-0.7 units/ml Monitor platelets by anticoagulation protocol: Yes   Plan:  Continue IV heparin at 1700 units/hr Repeat Coumadin 7.5mg   today. Coumadin book Coumadin video.  Walter [name], PharmD, BCPS Clinical Staff Pharmacist Pager [phone]  Walter [name] 10/17/2013,10:47 AM

## 2013-10-18 DIAGNOSIS — R82998 Other abnormal findings in urine: Secondary | ICD-10-CM

## 2013-10-18 DIAGNOSIS — R300 Dysuria: Secondary | ICD-10-CM

## 2013-10-18 DIAGNOSIS — Z7901 Long term (current) use of anticoagulants: Secondary | ICD-10-CM

## 2013-10-18 DIAGNOSIS — Z794 Long term (current) use of insulin: Secondary | ICD-10-CM

## 2013-10-18 LAB — CBC
HCT: 49.7 % (ref 39.0–52.0)
HEMOGLOBIN: 16.7 g/dL (ref 13.0–17.0)
MCH: 32.4 pg (ref 26.0–34.0)
MCHC: 33.6 g/dL (ref 30.0–36.0)
MCV: 96.3 fL (ref 78.0–100.0)
PLATELETS: 170 10*3/uL (ref 150–400)
RBC: 5.16 MIL/uL (ref 4.22–5.81)
RDW: 14.5 % (ref 11.5–15.5)
WBC: 6.7 10*3/uL (ref 4.0–10.5)

## 2013-10-18 LAB — BASIC METABOLIC PANEL
ANION GAP: 12 (ref 5–15)
BUN: 22 mg/dL (ref 6–23)
CALCIUM: 8.9 mg/dL (ref 8.4–10.5)
CO2: 26 mEq/L (ref 19–32)
Chloride: 98 mEq/L (ref 96–112)
Creatinine, Ser: 1.19 mg/dL (ref 0.50–1.35)
GFR, EST AFRICAN AMERICAN: 70 mL/min — AB (ref >90)
GFR, EST NON AFRICAN AMERICAN: 61 mL/min — AB (ref >90)
GLUCOSE: 118 mg/dL — AB (ref 70–99)
POTASSIUM: 4 meq/L (ref 3.7–5.3)
Sodium: 136 mEq/L — ABNORMAL LOW (ref 137–147)

## 2013-10-18 LAB — HEPARIN LEVEL (UNFRACTIONATED)
HEPARIN UNFRACTIONATED: 0.26 [IU]/mL — AB (ref 0.30–0.70)
HEPARIN UNFRACTIONATED: 0.42 [IU]/mL (ref 0.30–0.70)

## 2013-10-18 LAB — GLUCOSE, CAPILLARY: Glucose-Capillary: 128 mg/dL — ABNORMAL HIGH (ref 70–99)

## 2013-10-18 LAB — PROTIME-INR
INR: 1.47 (ref 0.00–1.49)
Prothrombin Time: 17.8 seconds — ABNORMAL HIGH (ref 11.6–15.2)

## 2013-10-18 MED ORDER — FUROSEMIDE 40 MG PO TABS
40.0000 mg | ORAL_TABLET | Freq: Every day | ORAL | Status: DC
  Administered 2013-10-18 – 2013-10-19 (×2): 40 mg via ORAL
  Filled 2013-10-18: qty 1

## 2013-10-18 MED ORDER — WARFARIN SODIUM 10 MG PO TABS
10.0000 mg | ORAL_TABLET | Freq: Once | ORAL | Status: AC
  Administered 2013-10-18: 10 mg via ORAL
  Filled 2013-10-18: qty 1

## 2013-10-18 NOTE — Progress Notes (Signed)
ANTICOAGULATION CONSULT NOTE - Follow Up Consult  Pharmacy Consult for Heparin Indication: afib, Factor V Leiden  Allergies  Allergen Reactions  . Statins     Myalgias.  Can only take Altoprev     Patient Measurements: Height: 6' (182.9 cm) Weight: 242 lb 8.1 oz (110 kg) IBW/kg (Calculated) : 77.6 Heparin Dosing Weight: 102 kg  Vital Signs: Temp: 98 F (36.7 C) (08/02 0632) Temp src: Oral (08/02 0632) BP: 124/73 mmHg (08/02 0632) Pulse Rate: 61 (08/02 0632)  Labs:  Recent Labs  10/16/13 0239  10/17/13 0330 10/17/13 0741 10/18/13 0349 10/18/13 1030  HGB 17.5*  --  17.8*  --  16.7  --   HCT 52.1*  --  53.2*  --  49.7  --   PLT 200  --  187  --  170  --   LABPROT 14.6  --   --  17.4* 17.8*  --   INR 1.14  --   --  1.42 1.47  --   HEPARINUNFRC 0.19*  < >  --  0.34 0.26* 0.42  CREATININE 1.15  --  1.15  --  1.19  --   < > = values in this interval not displayed.  Estimated Creatinine Clearance: 75.1 ml/min (by C-G formula based on Cr of 1.19).   Assessment: Abd pain, diarrhea. Presented in AFib  68 yo m who presented on 7/24 with a fib with RVR. Will likely switch him to NOAC a month after DCCV 7/31. 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 CHADSVASC score is 4. Heparin level 0.42 in goal. INR up to 1.47 (dose held 7/29 for EGD)  Cardiovascular- HTN, HLP, EF 20% (prev 55-60%) AFib. Meds: amio po, digoxin, po lasix, lisinopril, Toprol, lovaza, K+  Endocrinology: DM2 (A1c 6.6) on SSI, T4 ok. CBG 99-141  GI / Nutrition: PPI, EGD completed no issues noted, but TEE failed again  Neuro: requip  Nephrology: Scr 1.19. K+4 ok  Heme/onc: see AC  PTA Med Issues: lovastatin, testosterone inj, celebrex  Best Pract: hep gtt, warfarin  Goal of Therapy:  Heparin level 0.3-0.7 units/ml Monitor platelets by anticoagulation protocol: Yes   Plan:  Coumadin 10mg  po x 1 tonight Continue IV heparin at 1800 units/hr   Walter [name], PharmD,  BCPS Clinical Staff Pharmacist Pager [phone]  Eilene Ghazi Stillinger 10/18/2013,12:10 PM

## 2013-10-18 NOTE — Progress Notes (Signed)
ANTICOAGULATION CONSULT NOTE - Follow Up Consult  Pharmacy Consult for heparin Indication: atrial fibrillation  Labs:  Recent Labs  10/16/13 0239  10/17/13 0111 10/17/13 0330 10/17/13 0741 10/18/13 0349  HGB 17.5*  --   --  17.8*  --  16.7  HCT 52.1*  --   --  53.2*  --  49.7  PLT 200  --   --  187  --  170  LABPROT 14.6  --   --   --  17.4* 17.8*  INR 1.14  --   --   --  1.42 1.47  HEPARINUNFRC 0.19*  < > 0.19*  --  0.34 0.26*  CREATININE 1.15  --   --  1.15  --   --   < > = values in this interval not displayed.   Assessment: 68yo male now subtherapeutic on heparin after two levels at goal though had been trending down.  Goal of Therapy:  Heparin level 0.3-0.7 units/ml   Plan:  Will increase heparin gtt slightly to 1800 units/hr and check level in 6hr.  Wynona Neat, PharmD, BCPS  10/18/2013,4:53 AM

## 2013-10-18 NOTE — Progress Notes (Signed)
SUBJECTIVE: Pt feeling "very well" this morning. Denies chest pain, palpitations, and shortness of breath. Wants to take a shower. Said he hasn't had a full night's rest like last night in a long time. Maintaining sinus rhythm. Dysuria also improved.     Intake/Output Summary (Last 24 hours) at 10/18/13 0944 Last data filed at 10/18/13 0960  Gross per 24 hour  Intake   1440 ml  Output   3125 ml  Net  -1685 ml    Current Facility-Administered Medications  Medication Dose Route Frequency Provider Last Rate Last Dose  . 0.9 %  sodium chloride infusion  250 mL Intravenous PRN Thompson Grayer, MD      . acetaminophen (TYLENOL) tablet 650 mg  650 mg Oral Q4H PRN Thompson Grayer, MD      . albuterol (PROVENTIL) (2.5 MG/3ML) 0.083% nebulizer solution 2.5 mg  2.5 mg Nebulization Daily PRN Leonie Man, MD      . ALPRAZolam Duanne Moron) tablet 0.5 mg  0.5 mg Oral TID PRN Larey Dresser, MD   0.5 mg at 10/16/13 0747  . amiodarone (PACERONE) tablet 400 mg  400 mg Oral BID Lendon Colonel, NP   400 mg at 10/17/13 2112  . cholecalciferol (VITAMIN D) tablet 1,000 Units  1,000 Units Oral Daily Leonie Man, MD   1,000 Units at 10/17/13 1028  . digoxin (LANOXIN) tablet 0.25 mg  0.25 mg Oral Daily Jolaine Artist, MD   0.25 mg at 10/17/13 1027  . flavoxATE (URISPAS) tablet 100 mg  100 mg Oral TID PRN Tarri Fuller, PA-C   100 mg at 10/14/13 4540  . furosemide (LASIX) tablet 40 mg  40 mg Oral BID Larey Dresser, MD   40 mg at 10/17/13 1753  . heparin ADULT infusion 100 units/mL (25000 units/250 mL)  1,800 Units/hr Intravenous Continuous Rogue Bussing, [facility] 18 mL/hr at 10/18/13 0454 1,800 Units/hr at 10/18/13 0454  . HYDROcodone-acetaminophen (NORCO/VICODIN) 5-325 MG per tablet 2 tablet  2 tablet Oral Q6H PRN Leonie Man, MD   2 tablet at 10/17/13 2125  . hydrocortisone cream 1 % 1 application  1 application Topical TID PRN Larey Dresser, MD      . insulin aspart (novoLOG) injection  0-15 Units  0-15 Units Subcutaneous TID WC Larey Dresser, MD   2 Units at 10/18/13 [phone]  . lisinopril (PRINIVIL,ZESTRIL) tablet 10 mg  10 mg Oral Daily Leonie Man, MD   10 mg at 10/17/13 1028  . loperamide (IMODIUM) capsule 2 mg  2 mg Oral PRN Jolaine Artist, MD   2 mg at 10/14/13 1707  . metoprolol succinate (TOPROL-XL) 24 hr tablet 25 mg  25 mg Oral Daily Lendon Colonel, NP   25 mg at 10/17/13 1026  . omega-3 acid ethyl esters (LOVAZA) capsule 1 g  1 g Oral BID Leonie Man, MD   1 g at 10/17/13 2112  . ondansetron (ZOFRAN) injection 4 mg  4 mg Intravenous Q6H PRN Thompson Grayer, MD      . pantoprazole (PROTONIX) EC tablet 40 mg  40 mg Oral Daily Leonie Man, MD   40 mg at 10/17/13 1033  . phenazopyridine (PYRIDIUM) tablet 100 mg  100 mg Oral TID WC Rogelia Mire, NP   100 mg at 10/18/13 0609  . potassium chloride SA (K-DUR,KLOR-CON) CR tablet 40 mEq  40 mEq Oral Daily Jolaine Artist, MD   40 mEq  at 10/17/13 1027  . rOPINIRole (REQUIP) tablet 0.5 mg  0.5 mg Oral Daily PRN Leonie Man, MD   0.5 mg at 10/12/13 1534  . rOPINIRole (REQUIP) tablet 1 mg  1 mg Oral QHS Leonie Man, MD   1 mg at 10/17/13 2112  . sodium chloride 0.9 % injection 3 mL  3 mL Intravenous Q12H Thompson Grayer, MD   3 mL at 10/17/13 2112  . sodium chloride 0.9 % injection 3 mL  3 mL Intravenous PRN Thompson Grayer, MD      . warfarin (COUMADIN) video   Does not apply Once Larey Dresser, MD      . Warfarin - Pharmacist Dosing Inpatient   Does not apply q1800 Cassie Jodean Lima, RPH      . zolpidem (AMBIEN) tablet 5 mg  5 mg Oral QHS PRN Larey Dresser, MD   5 mg at 10/17/13 2125    Filed Vitals:   10/17/13 1634 10/17/13 2123 10/18/13 0113 10/18/13 0632  BP: 120/64 118/70 109/61 124/73  Pulse: 78 64 63 61  Temp: 98.3 F (36.8 C) 97.9 F (36.6 C) 97.8 F (36.6 C) 98 F (36.7 C)  TempSrc: Oral Oral Oral Oral  Resp: 18 20 18 18   Height:      Weight:    242 lb 8.1 oz (110 kg)  SpO2: 94%  96% 93% 97%    PHYSICAL EXAM General: NAD Neck: No JVD, no thyromegaly.  Lungs: Clear to auscultation bilaterally with normal respiratory effort. CV: Nondisplaced PMI.  Regular rate and rhythm, normal S1/S2, no S3/S4, no murmur.  No pretibial edema.  No carotid bruit.  Normal pedal pulses.  Abdomen: Soft, nontender, no hepatosplenomegaly, no distention.  Neurologic: Alert and oriented x 3.  Psych: Normal affect. Extremities: No clubbing or cyanosis.   TELEMETRY: Reviewed telemetry pt in sinus rhythm.  LABS: Basic Metabolic Panel:  Recent Labs  10/17/13 0330 10/18/13 0349  NA 140 136*  K 4.1 4.0  CL 98 98  CO2 27 26  GLUCOSE 111* 118*  BUN 23 22  CREATININE 1.15 1.19  CALCIUM 9.0 8.9   Liver Function Tests: No results found for this basename: AST, ALT, ALKPHOS, BILITOT, PROT, ALBUMIN,  in the last 72 hours No results found for this basename: LIPASE, AMYLASE,  in the last 72 hours CBC:  Recent Labs  10/17/13 0330 10/18/13 0349  WBC 7.7 6.7  HGB 17.8* 16.7  HCT 53.2* 49.7  MCV 97.6 96.3  PLT 187 170   Cardiac Enzymes: No results found for this basename: CKTOTAL, CKMB, CKMBINDEX, TROPONINI,  in the last 72 hours BNP: No components found with this basename: POCBNP,  D-Dimer: No results found for this basename: DDIMER,  in the last 72 hours Hemoglobin A1C: No results found for this basename: HGBA1C,  in the last 72 hours Fasting Lipid Panel: No results found for this basename: CHOL, HDL, LDLCALC, TRIG, CHOLHDL, LDLDIRECT,  in the last 72 hours Thyroid Function Tests: No results found for this basename: TSH, T4TOTAL, FREET3, T3FREE, THYROIDAB,  in the last 72 hours Anemia Panel: No results found for this basename: VITAMINB12, FOLATE, FERRITIN, TIBC, IRON, RETICCTPCT,  in the last 72 hours  RADIOLOGY: Nm Myocar Multi W/spect W/wall Motion / Ef  10/10/2013   CLINICAL DATA:  68 year old with current history of nonischemic cardiomyopathy and previous nonobstructive  coronary artery disease on outside heart catheterization in 2011, presenting with recent onset of atrial fibrillation and rapid ventricular response.  EXAM: MYOCARDIAL IMAGING WITH SPECT (REST AND PHARMACOLOGIC-STRESS)  GATED LEFT VENTRICULAR WALL MOTION STUDY  LEFT VENTRICULAR EJECTION FRACTION  TECHNIQUE: Standard myocardial SPECT imaging was performed after resting intravenous injection of 10 mCi Tc-72m sestamibi. Subsequently, intravenous infusion of Lexiscan was performed under the supervision of the Cardiology staff. At peak effect of the drug, 30 mCi Tc-45m sestamibi was injected intravenously and standard myocardial SPECT imaging was performed. Quantitative gated imaging was also performed to evaluate left ventricular wall motion, and estimate left ventricular ejection fraction.  COMPARISON:  None.  FINDINGS: Immediate post regadenoson images demonstrate diminished activity in the inferior wall, apex, and inferolateral wall. Activity in the anterior wall is normal. Initial resting images demonstrate similar findings. No evidence of reversibility to suggest ischemia. Findings confirmed by the computer generated polar map.  Gated images demonstrate severe global hypokinesis with essential akinesis in the inferior wall and apex.  Estimated QGS left ventricular ejection fraction measured 18%, with an end-diastolic volume of 762 ml and an end systolic volume of 831 ml.  IMPRESSION: 1. Inferior wall, apical and inferolateral wall MI. No evidence of myocardial ischemia. 2. Severe global hypokinesis with a central akinesis in the inferior wall and apex. 3. Estimated QGS ejection fraction 18%. 4. Left ventricular enlargement.   Electronically Signed   By: Evangeline Dakin M.D.   On: 10/10/2013 12:47   Dg Chest Port 1 View  10/09/2013   CLINICAL DATA:  Shortness of breath. Abdominal pain. Weakness. Hypertension. Coronary artery disease.  EXAM: PORTABLE CHEST - 1 VIEW  COMPARISON:  CT chest 04/16/2013.  Chest x-ray  02/11/2013.  FINDINGS: Mediastinum and hilar structures are normal. Cardiomegaly. Normal pulmonary vascularity. Calcified pleural plaques are present consistent with prior asbestos exposure. No pleural effusion or pneumothorax. No acute bony abnormality.  IMPRESSION: 1. Cardiomegaly, no CHF. 2. Calcified pleural plaques. This is consistent with prior asbestos exposure. No change from prior chest CT of 04/16/2013 and chest x-ray of 02/11/2013. No acute pulmonary disease.   Electronically Signed   By: Marcello Moores  Register   On: 10/09/2013 13:52   Dg Esophagus W/water Sol Cm  10/14/2013   CLINICAL DATA:  Inability to pass transesophageal probe ; occasional dysphagia with solid material  EXAM: ESOPHOGRAM/BARIUM SWALLOW  TECHNIQUE: Single contrast examination was performed using water-soluble contrast, 150 mL Omnipaque 300, and thin barium. A 13 mm barium tablet was also administered.  FLUOROSCOPY TIME:  1 min 27 seconds  COMPARISON:  None.  FINDINGS: The patient swallows readily. There is occasional esophageal dysmotility with mild tertiary contractions. There is no appreciable hiatal hernia or demonstrable reflux. There is no stricture, mass, or ulceration. No pharyngeal lesions are appreciable.  A 13 mm barium tablet passed freely to the level of the distal esophagus where there was spasm. The tablet passed through the distal esophagus into the stomach after several boluses of water.  IMPRESSION: Intermittent esophageal dysmotility with spasm distally with solid material. No esophageal mass, ulceration, or stricture seen. Study otherwise unremarkable.   Electronically Signed   By: Lowella Grip M.D.   On: 10/14/2013 08:45      ASSESSMENT AND PLAN: 1. Atrial fibrillation: Maintaining sinus rhythm after cardioversion with intracardiac echo which ruled out LAA thrombus by Dr. Rayann Heman. Switched IV amiodarone to oral on 8/1. Started Toprol-XL 25 mg daily on 8/1 as well. Pharmacy to dose warfarin with heparin for  bridging. INR remains subtherapeutic. Also on digoxin and lisinopril. Has purported tachycardia-mediated cardiomyopathy.  Possibly switch to target-specific anticoagulant after one  month.  Pt willing to attempt Lovenox for bridging.  2. Acute on chronic systolic CHF: EF down to 03% from 50% in 3/15. LHC/RHC 7/27 with mildly elevated filling pressures and mild nonobstructive CAD. 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  - Continue lisinopril and Toprol XL.  - Will reduce Lasix to 40 mg po daily. Greater than 3 liters output in last 48 hours.  -Will need repeat echo to assess for interval improvement in LV systolic function in 3 months.  3. Chronic LBBB  4. Dysuria: While UA showed bacteria, WBC is normal and pt says symptoms have improved. No fevers. No indication for antimicrobial therapy at this time.  5. IDDM: Stable.  Dispo: Possible d/c on 8/3.  Kate Sable, M.D., F.A.C.C.

## 2013-10-19 ENCOUNTER — Other Ambulatory Visit: Payer: Self-pay | Admitting: Physician Assistant

## 2013-10-19 ENCOUNTER — Encounter (HOSPITAL_COMMUNITY): Payer: Self-pay | Admitting: Internal Medicine

## 2013-10-19 DIAGNOSIS — R0600 Dyspnea, unspecified: Secondary | ICD-10-CM

## 2013-10-19 DIAGNOSIS — I480 Paroxysmal atrial fibrillation: Secondary | ICD-10-CM

## 2013-10-19 LAB — CBC
HCT: 49.4 % (ref 39.0–52.0)
Hemoglobin: 16.7 g/dL (ref 13.0–17.0)
MCH: 32.7 pg (ref 26.0–34.0)
MCHC: 33.8 g/dL (ref 30.0–36.0)
MCV: 96.7 fL (ref 78.0–100.0)
Platelets: 179 10*3/uL (ref 150–400)
RBC: 5.11 MIL/uL (ref 4.22–5.81)
RDW: 14.4 % (ref 11.5–15.5)
WBC: 6.7 10*3/uL (ref 4.0–10.5)

## 2013-10-19 LAB — HEPARIN LEVEL (UNFRACTIONATED): HEPARIN UNFRACTIONATED: 0.3 [IU]/mL (ref 0.30–0.70)

## 2013-10-19 LAB — GLUCOSE, CAPILLARY: Glucose-Capillary: 156 mg/dL — ABNORMAL HIGH (ref 70–99)

## 2013-10-19 LAB — PROTIME-INR
INR: 1.76 — ABNORMAL HIGH (ref 0.00–1.49)
Prothrombin Time: 20.5 seconds — ABNORMAL HIGH (ref 11.6–15.2)

## 2013-10-19 MED ORDER — METOPROLOL SUCCINATE ER 25 MG PO TB24: 25.0000 mg | ORAL_TABLET | Freq: Every day | ORAL | Status: DC

## 2013-10-19 MED ORDER — WARFARIN SODIUM 10 MG PO TABS
10.0000 mg | ORAL_TABLET | Freq: Once | ORAL | Status: DC
  Filled 2013-10-19: qty 1

## 2013-10-19 MED ORDER — ENOXAPARIN SODIUM 120 MG/0.8ML ~~LOC~~ SOLN: 1.0000 mg/kg | Freq: Two times a day (BID) | SUBCUTANEOUS | Status: DC

## 2013-10-19 MED ORDER — LISINOPRIL 10 MG PO TABS: 10.0000 mg | ORAL_TABLET | Freq: Every day | ORAL | Status: DC

## 2013-10-19 MED ORDER — AMIODARONE HCL 400 MG PO TABS: 400.0000 mg | ORAL_TABLET | Freq: Two times a day (BID) | ORAL | Status: DC

## 2013-10-19 MED ORDER — ENOXAPARIN SODIUM 120 MG/0.8ML ~~LOC~~ SOLN
1.0000 mg/kg | Freq: Two times a day (BID) | SUBCUTANEOUS | Status: DC
  Administered 2013-10-19: 110 mg via SUBCUTANEOUS
  Filled 2013-10-19 (×2): qty 0.8

## 2013-10-19 MED ORDER — WARFARIN SODIUM 10 MG PO TABS: 10.0000 mg | ORAL_TABLET | Freq: Every day | ORAL | Status: DC

## 2013-10-19 MED ORDER — FUROSEMIDE 40 MG PO TABS: 40.0000 mg | ORAL_TABLET | Freq: Every day | ORAL | Status: DC

## 2013-10-19 MED ORDER — DIGOXIN 125 MCG PO TABS: 0.1250 mg | ORAL_TABLET | Freq: Every day | ORAL | Status: DC

## 2013-10-19 MED ORDER — DIGOXIN 125 MCG PO TABS
0.1250 mg | ORAL_TABLET | Freq: Every day | ORAL | Status: DC
  Administered 2013-10-19: 0.125 mg via ORAL
  Filled 2013-10-19: qty 1

## 2013-10-19 MED ORDER — POTASSIUM CHLORIDE CRYS ER 20 MEQ PO TBCR: 40.0000 meq | EXTENDED_RELEASE_TABLET | Freq: Every day | ORAL | Status: DC

## 2013-10-19 MED ORDER — SULFAMETHOXAZOLE-TMP DS 800-160 MG PO TABS: 1.0000 | ORAL_TABLET | Freq: Two times a day (BID) | ORAL | Status: DC

## 2013-10-19 MED ORDER — AMIODARONE HCL 200 MG PO TABS: 200.0000 mg | ORAL_TABLET | Freq: Two times a day (BID) | ORAL | Status: DC

## 2013-10-19 NOTE — Discharge Summary (Signed)
Discharge Summary   Patient ID: Walter [name],  MRN: [id number], DOB/AGE: 1945-10-14 68 y.o.  Admit date: 10/09/2013 Discharge date: 10/19/2013  Primary Care Provider: London Pepper Primary Cardiologist: Dr. Aundra Dubin  Discharge Diagnoses Principal Problem:   Atrial fibrillation with RVR  - failed TEE with cardioversion x 2 due to inability to pass U/S probe, EGD negative for stricture  - s/p intracardiac u/s and cardioversion  - continue amiodarone 400mg  BID for 1 week, switch to 200mg  BID  - continue coumadin  Active Problems:   Essential hypertension, benign   Restless leg syndrome   Non-occlusive coronary artery disease   Abnormal cardiovascular function study   Cardiomyopathy, nonischemic  - abnormal stress test 10/10/2013, negative cath 10/12/2013, see report below  - EF 20% on echo 10/10/2013  - check echo in 3 month, may need ICD   Esophageal spasm   Dysuria with possible UTI   Allergies Allergies  Allergen Reactions  . Statins     Myalgias.  Can only take Altoprev     Procedures  1. Lexiscan stress test 10/10/2013 IMPRESSION:  1. Inferior wall, apical and inferolateral wall MI. No evidence of  myocardial ischemia.  2. Severe global hypokinesis with a central akinesis in the inferior  wall and apex.  3. Estimated QGS ejection fraction 18%.  4. Left ventricular enlargement.  2. Echocardiogram 10/10/2013 LV EF: 20%  ------------------------------------------------------------------- Indications: Atrial fibrillation - 427.31.  ------------------------------------------------------------------- History: PMH: Coronary artery disease. Non-ischemic cardiomyopathy. Risk factors: Former tobacco use. Hypertension.  ------------------------------------------------------------------- Study Conclusions  - Left ventricle: The cavity size was at the upper limits of normal. Wall thickness was increased in a pattern of mild LVH. Systolic function was severely reduced.  The estimated ejection fraction was 20%. Diffuse hypokinesis. Probable akinesis of the basal-midinferolateral and inferior myocardium. The study is not technically sufficient to allow evaluation of LV diastolic function. - Ventricular septum: Septal motion showed abnormal function and dyssynergy. - Aortic valve: Mildly calcified annulus. Trileaflet. There was no significant regurgitation. - Mitral valve: Calcified annulus. There was trivial regurgitation. - Left atrium: The atrium was moderately dilated. - Right ventricle: Systolic function was mildly to moderately reduced. - Right atrium: The atrium was moderately dilated. Central venous pressure (est): 15 mm Hg. - Tricuspid valve: There was trivial regurgitation. - Pulmonary arteries: PA peak pressure: 35 mm Hg (S). - Pericardium, extracardiac: There was no pericardial effusion.  Impressions:  - Upper normal LV chamber size with mild LVH and LVEF approximately 20%. Relatively diffuse hypokinesis, although most prominent in the mid to basal inferolateral wall. Indeterminate diastolic function. Moderate biatrial enlargement. Mild to moderate reduction in RV contraction. Trivial tricuspid regurgitation with PASP 35 mm mercury.   3. LHC and RHC 10/12/2013 HEMODYNAMICS:  RA: 11  RV: 35/9  PA: 30/21  PC: Visual 20-22  LV: 130/19  AO: 130/74  Repeat PA pressure at the completion of the procedure: 48/27  Oxygen saturation is obtained on 2 L nasal cannula in the aorta 95% and the pulmonary artery 70%  Cardiac output: 4.4 l/min (Thermo); 5.6 (Fick)  Cardiac index: 1.8 l/m/m2 2.4  ANGIOGRAPHY:  There is evidence on fluoroscopy. Coronary calcification.  Left main: Mild coronary calcification without stenosis and bifurcated into the LAD and left circumflex coronary artery.  LAD: Mild calcification. The vessel is free of significant obstructive disease and gave rise to a small first diagonal and large proximal second diagonal vessel.  LAD wrapped around it.  Left circumflex: Mild smooth 20% proximal narrowing prior to  giving rise to a moderate-sized first marginal branch. The remainder of the vessel was normal  Right coronary artery: Angiographically normal vessel  Left ventriculography not performed due to due significant heart rate irritability and prior documentation of severe LV dysfunction.  Total contrast used: 50 cc  IMPRESSION:  Severe nonischemic cardiomyopathy  Mild coronary calcification without significant coronary obstructive disease with mild smooth 20% proximal circumflex stenosis.  Atrial fibrillation with rapid ventricular response  Transient profound bradycardia /near asystole requiring atropine.  RECOMMENDATION:  Medical therapy for his severe cardiomyopathy. The patient is now on IV amiodarone with improvement. Ventricular rate control Cardizem. Anticoagulation will be instituted post catheterization with medical therapy titration. He did benefit from ultimate show sinus rhythm. If left jugular function does not improve, life-vest/potential future ICD therapy may be necessary.   4. Failed TEE #1 7/28 IMPRESSION:  1. Inability to successfully visualize the heart due to lodging of the probe in the upper esophagus. The scope passed the UES easily and the patient was sedated with propofol per anethesia due to difficulty in achieving moderate sedation with versed and fentanyl. RECOMMENDATIONS:  1. Consider barium esophagram to evaluate for cause of inability to pass the scope, prior to another attempt at TEE. Would be best to schedule the case in the endoscopy suite with anesthesia for sedation.  5. Endoscopy 7/30 RECOMMENDATIONS:  TEE attempted again scope would not pass beyond 35 cm.  No stricture here, ? extrinsic effect. 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   6. Failed TEE #2 7/30 The patient had a failed attempt at TEE earlier this week - Dr. Debara Pickett was unable to pass the scope.  Dr. Carlean Purl  performed an EGD prior to our 2nd attempt and the esophagus looked clear.  I was unable to pass the scope any further than 35 cm. Dr. Carlean Purl also attempted to pass the scope but was unable to .  The procedure was aborted at that time.  7. Intracardiac echo with cardioversion 7/31    Hospital Course  The patient is a 68 year old male with past medical history significant for nonischemic cardiomyopathy, chronic left bundle branch block, diabetes, hypertension and hyperlipidemia. He has recently establish care with Dr. Aundra Dubin after moving from [facility]. Per report, patient had nonischemic cardiomyopathy in [place] EF 45-50% with nonobstructive CAD in 2011. Echocardiogram was obtained in March 2015 which showed his EF has recovered to 55%.   Patient has noted to have significantly increased dyspnea with exertion for roughly 10 days prior to his ED visit. He also noticed mild edema and some nausea as well. There has been some dizziness but no syncope or near syncope. He also has been very fatigued. He finally decided to seek medical attention at [facility] on 10/09/2013. Initial evaluation in the emergency room noted patient was tachycardic with heart rate in the 190s. EKG was consistent with either SVT or atrial fibrillation with RVR. He was given 2 dose of IV adenosine without effect. He was started on diltiazem drip and admitted for further evaluation. He was also started on IV heparin and lasix. Patient underwent Lexiscan stress test on 7/25 which showed calculated EF 18%, left ventricular enlargement, severe global hypokinesis with septal akinesis and apex, previous inferior wall apical and inferolateral wall MI, however no evidence of myocardial ischemia. Echocardiogram was obtained on 7/25 which showed EF 20%, diffuse hypokinesis, probable akinesis of the basal mid inferolateral and inferior myocardium, CVP 15 mmHg, moderately dilated LA. Patient underwent cardiac catheterization on  10/12/2013 which showed severe nonischemic cardiomyopathy, mild coronary calcification without significant obstruction with mild smooth 20% proximal left circumflex stenosis. Patient had transient profound bradycardia and near-syncope procedure requiring atropine. Medical therapy has been recommended. He was transitioned to Toprol-XL on 10/13/2013, however his HR was very difficult to control. Patient underwent the planned TEE with cardioversion on 10/05/2013 however during the procedure, probe lodged in the upper esophagus and was unable to successfully visualize the heart. Barium esophagram was recommended. GI was consulted who reviewed the barium swallow image and felt the patient likely had esophageal spasm, however will obtain EGD for definitive assessment. Patient's Coumadin was stopped on 7/29 for pending upper EGD on the following day. Upper EGD performed on 7/30 which did not reveal any stricture, questionable extrinsic affect. A second TEE was attempted after the negative EGD, however probed again was not able to pass any further than 35 cm. Dr. Carlean Purl of gastroenterology also attempted to pass the scope and was unable to do so, and the procedure was aborted. Electrophysiology was consulted on 7/30 after 2 failed attempt at TEE with cardioversion, metoprolol was increased however patient's heart rate continued to be over 100 at rest. Given difficulty with rate control and presumed tachycardia mediated cardiomyopathy, it was decided to plan for an intracardiac echo evaluation by Dr. Rayann Heman which showed no evidence of LA thrombus and he was subsequently cardioverted back to normal sinus rhythm on 7/31. Coumadin was continued with INR goal of 2.0-3.0. He was transitioned from IV amiodarone to PO amiodarone 10/17/2013.   Patient was seen the morning of 10/19/2013, at which time, he was maintaining normal sinus rhythm while without significant chest discomfort or shortness of breath. Urinalysis from 8/1 did reveal  many bacteria however negative nitrite. Patient continued to complain of dysuria which he described as burning sensation during urination. He is currently on both amiodarone and Coumadin, however given his symptom, I have prescribed short duration of Bactrim. His recent EKG did reveal his QTc interval was 480. We will closely monitor his QTc interval and any symptom associated with antibiotic use. After discussing with pharmacist, we will continue on 10mg  daily coumadin for now and have short follow up with pharmacist in the clinic. He should contact cardiology if he experienced any dizziness, chest discomfort or shortness of breath. I have scheduled outpatient pharmacy followup as he is new on Coumadin. During the meantime, he will bridge with Lovenox as outpatient. He will have PT/INR checked in the clinic followed by pharmacy consult on 10/20/2013. Have also scheduled followup with Dr. Claris Gladden PA in the clinic. Since he has been placed on lasix, I have also scheduled BMET check in 1 week.  Patient will need echo in the clinic in 3 month, if EF continue to be low, will need assess for ICD placement.  Discharge Vitals Blood pressure 115/64, pulse 65, temperature 98.1 F (36.7 C), temperature source Oral, resp. rate 18, height 6' (1.829 m), weight 240 lb 6.4 oz (109.045 kg), SpO2 95.00%.  Filed Weights   10/17/13 1215 10/18/13 0632 10/19/13 0516  Weight: 244 lb 11.4 oz (111 kg) 242 lb 8.1 oz (110 kg) 240 lb 6.4 oz (109.045 kg)    Labs  CBC  Recent Labs  10/18/13 0349 10/19/13 0328  WBC 6.7 6.7  HGB 16.7 16.7  HCT 49.7 49.4  MCV 96.3 96.7  PLT 170 703   Basic Metabolic Panel  Recent Labs  10/17/13 0330 10/18/13 0349  NA 140 136*  K 4.1 4.0  CL 98 98  CO2 27 26  GLUCOSE 111* 118*  BUN 23 22  CREATININE 1.15 1.19  CALCIUM 9.0 8.9    Disposition  Pt is being discharged home today in good condition.  Follow-up Plans & Appointments      Follow-up Information   Follow up  with [facility] On 10/20/2013. (10:35am. Check PT/INR in the clinic)    Contact information:   [address] [place] [address]       Follow up with [facility] On 10/26/2013. (check BMET in 1 week after discharge)    Specialty:  Cardiology   Contact information:   [address] [place] [address] [phone]      Follow up with Truitt Merle, NP On 11/25/2013. (8:30am)    Specialty:  Nurse Practitioner   Contact information:   [address] [place] [address] [phone]        Discharge Medications    Medication List         albuterol 108 (90 BASE) MCG/ACT inhaler  Commonly known as:  PROVENTIL HFA;VENTOLIN HFA  Inhale 2 puffs into the lungs as needed for wheezing or shortness of breath.     amiodarone 400 MG tablet  Commonly known as:  PACERONE  Take 1 tablet (400 mg total) by mouth 2 (two) times daily. After finish this course, dose decrease to 200mg  twice a day     amiodarone 200 MG tablet  Commonly known as:  PACERONE  Take 1 tablet (200 mg total) by mouth 2 (two) times daily. Start on 10/24/2013 after finishing the 400mg  twice a day of amiodarone (decreased dose)  Start taking on:  10/24/2013     aspirin 81 MG tablet  Take 81 mg by mouth 2 (two) times daily.     celecoxib 200 MG capsule  Commonly known as:  CELEBREX  Take 200 mg by mouth daily.     cholecalciferol 1000 UNITS tablet  Commonly known as:  VITAMIN D  Take 1,000 Units by mouth daily.     DEPO-TESTOSTERONE 200 MG/ML injection  Generic drug:  testosterone cypionate  Inject into the muscle every 7 (seven) days.     digoxin 0.125 MG tablet  Commonly known as:  LANOXIN  Take 1 tablet (0.125 mg total) by mouth daily.     enoxaparin 120 MG/0.8ML injection  Commonly known as:  LOVENOX  Inject 0.73 mLs (110 mg total) into the skin every 12 (twelve) hours.     esomeprazole 40 MG capsule  Commonly known as:   NEXIUM  Take 40 mg by mouth daily at 12 noon.     furosemide 40 MG tablet  Commonly known as:  LASIX  Take 1 tablet (40 mg total) by mouth daily.     hydrochlorothiazide 25 MG tablet  Commonly known as:  HYDRODIURIL  Take 25 mg by mouth daily.     HYDROcodone-acetaminophen 5-325 MG per tablet  Commonly known as:  NORCO/VICODIN  Take 2 tablets by mouth every 6 (six) hours as needed for moderate pain.     lisinopril 10 MG tablet  Commonly known as:  PRINIVIL,ZESTRIL  Take 1 tablet (10 mg total) by mouth daily.     loperamide 2 MG capsule  Commonly known as:  IMODIUM  Take by mouth as needed for diarrhea or loose stools.     lovastatin 60 MG 24 hr tablet  Commonly known as:  ALTOPREV  Take 60 mg by mouth at bedtime.     magnesium oxide 400 MG tablet  Commonly known as:  MAG-OX  Take 400 mg by mouth daily.     metFORMIN 500 MG tablet  Commonly known as:  GLUCOPHAGE  Take 500 mg by mouth 2 (two) times daily with a meal.     metoprolol succinate 25 MG 24 hr tablet  Commonly known as:  TOPROL-XL  Take 1 tablet (25 mg total) by mouth daily.     omega-3 acid ethyl esters 1 G capsule  Commonly known as:  LOVAZA  Take 1 g by mouth 2 (two) times daily.     potassium chloride SA 20 MEQ tablet  Commonly known as:  K-DUR,KLOR-CON  Take 2 tablets (40 mEq total) by mouth daily.     rOPINIRole 1 MG tablet  Commonly known as:  REQUIP  Take 1 mg by mouth at bedtime.     rOPINIRole 0.5 MG tablet  Commonly known as:  REQUIP  Take 0.5 mg by mouth daily as needed.     sildenafil 100 MG tablet  Commonly known as:  VIAGRA  Take 100 mg by mouth daily as needed for erectile dysfunction.     sulfamethoxazole-trimethoprim 800-160 MG per tablet  Commonly known as:  BACTRIM DS  Take 1 tablet by mouth 2 (two) times daily.     tiZANidine 4 MG capsule  Commonly known as:  ZANAFLEX  Take 4 mg by mouth as needed for muscle spasms.     warfarin 10 MG tablet  Commonly known as:  COUMADIN   Take 1 tablet (10 mg total) by mouth daily at 6 PM.        Outstanding Labs/Studies  PT/INT in lab tomorrow. BMET in the clinic in 1 week.  Duration of Discharge Encounter   Greater than 30 minutes including physician time.  Walter Corrigan PA-C 10/19/2013, 11:48 AM

## 2013-10-19 NOTE — Anesthesia Postprocedure Evaluation (Signed)
Anesthesia Post Note  Patient: Walter [name]  Procedure(s) Performed: Procedure(s) (LRB): ELECTROPHYSIOLOGY STUDY (N/A) CARDIOVERSION (N/A)  Anesthesia type: MAC  Patient location: PACU  Post pain: Pain level controlled and Adequate analgesia  Post assessment: Post-op Vital signs reviewed, Patient's Cardiovascular Status Stable and Respiratory Function Stable  Last Vitals:  Filed Vitals:   10/19/13 0516  BP: 119/73  Pulse: 53  Temp: 36.8 C  Resp: 19    Post vital signs: Reviewed and stable  Level of consciousness: awake, alert  and oriented  Complications: No apparent anesthesia complications

## 2013-10-19 NOTE — Op Note (Signed)
Walter Walter [name], Walter [name]                ACCOUNT NO.:  [id number]  MEDICAL RECORD NO.:  [id number]  LOCATION:                                 FACILITY:  PHYSICIAN:  Thompson Grayer, MD       DATE OF BIRTH:  1945-05-11  DATE OF PROCEDURE:  10/16/2013 DATE OF DISCHARGE:                              OPERATIVE REPORT   SURGEON:  Thompson Grayer, MD.  ASSISTANT FOR ECHOCARDIOGRAPHIC INTERPRETATION:  Dr. Acie Fredrickson.  PREPROCEDURE DIAGNOSES: 1. Persistent atrial fibrillation. 2. Tachycardia-bradycardia syndrome. 3. Left bundle-branch block. 4. Acute systolic dysfunction. 5. Esophageal extrinsic compression limiting ability to perform TEE.  POSTPROCEDURE DIAGNOSIS: 1. Persistent atrial fibrillation. 2. Tachycardia-bradycardia syndrome. 3. Left bundle-branch block. 4. Acute systolic dysfunction. 5. Esophageal extrinsic compression limiting ability to perform TEE.  PROCEDURES: 1. His bundle recording and pacing. 2. Right ventricular recording and pacing. 3. Intracardiac echocardiography. 4. External cardioversion.  INTRODUCTION:  Walter Walter [name] is a very pleasant 68 year old gentleman who presents with acute systolic dysfunction and a left bundle-branch block. He is noted to have atrial fibrillation with rapid ventricular rates. Control of his ventricular rates has been limited by hypotension. Transesophageal echocardiogram guided cardioversion was planned; however, despite multiple attempts including an attempt assisted by Gastroenterology, a TEE probe could not be placed.  It was felt by Dr. Carlean Purl that the patient likely had extrinsic compression of his esophagus.  The patient is felt to require sinus rhythm due to acute decompensated systolic dysfunction.  He therefore presents for intracardiac echo guided cardioversion today.  Intracardiac echocardiography has been evaluated previously for left atrial appendage thrombus detection as reported in Circulation Arrhythmias, Electrophysiology in  2013.  The patient during recent right heart catheterization developed transient complete heart block.  It is therefore prudent to perform His bundle recording and ventricular pacing if necessary.  DESCRIPTION OF PROCEDURE:  Informed written consent was obtained and the patient was brought to the electrophysiology lab in the fasting state. He was adequately sedated with intravenous medications as outlined in the anesthesia report.  The patient's right groin was prepped and draped in usual sterile fashion by the EP lab staff.  Using a percutaneous Seldinger technique, one 6-French and one 9-French hemostasis sheaths were placed in the right common femoral vein.  A 6-French quadripolar Josephson catheter was introduced through the right common femoral vein and advanced into the right ventricle for recording and pacing.  This catheter was then pulled back to the His bundle location.  The patient presented to the electrophysiology lab in atrial fibrillation.  The HV interval measured 44 milliseconds initially.  The QRS duration was 163 milliseconds with a left bundle-branch block QRS morphology.  The average RR interval was 420 milliseconds.  An 8-French AcuNav intracardiac echocardiography catheter was introduced through the right common femoral vein and advanced into the right atrium.  Adequate visualization of the left atrium was noted in this location.  The patient was noted to have no thrombi within the left atrium and the entire left atrium was actually visualized.  There was no evidence of a PFO today.  All 4 pulmonary veins were visualized and noted to have separate ostia.  The pulmonary veins  were standard in size.  The patient was noted to have a standard single lobed left atrial appendage.  There was no evidence of thrombus within the appendage.  The mitral valve was also evaluated and noted to be structurally normal.  The tricuspid valve was evaluated and noted to be  structurally normal and there was a moderate degree of tricuspid regurgitation.  However, the patient did have the pacing catheter through the tricuspid valve at that time.  The right ventricular function was mildly depressed.  The right atrial appendage was noted to have no thrombi.  It was well visualized today. The right atrium was also visualized and did not have any evidence of thrombus formation.  The intracardiac echocardiography catheter then advanced into the pulmonary artery just above the pulmonic valve.  Very nice views of the left atrial appendage were obtained in this location. The pectinate muscles of the left atrial appendage could be well visualized.  There was no thrombus within the left atrial appendage. The catheter was then pulled back down to the ostium of the coronary sinus and additional views were obtained.  In the first third of the coronary sinus, the intracardiac echocardiography catheter was used to visualize the mitral valve, left atrium, and left atrial appendage.  The mitral valve appeared to be structurally normal.  The valvular apparatus was also visualized and appeared to be intact.  The left ventricular function was severely depressed with an ejection fraction approximately 20%.  The left atrial appendage was well visualized and did not reveal a left atrial appendage thrombus.  As the catheter was manipulated further, the patient had transient AV block and ventricular pacing was required.  The intracardiac echocardiography catheter was at this point removed from the body.  The patient had return of his AV conduction very quickly and required only brief pacing.  The patient was then successfully cardioverted to sinus rhythm with a single synchronized 360 joule biphasic shock delivered with cardioversion electrodes in the anterior-posterior configuration.  He remained in sinus rhythm thereafter.  In sinus rhythm, AH interval measured 67 milliseconds with  an HV interval of 70 milliseconds.  The procedure was therefore considered completed.  All catheters were removed and the sheaths were aspirated and flushed.  The sheaths were removed and hemostasis was assured.  There were no early apparent complications.  CONCLUSIONS: 1. Atrial fibrillation with a rapid ventricular response upon     presentation. 2. The patient had a very adequate visualization of his left atrial     appendage from the right atrium, the coronary sinus, and also the     pulmonary artery as described and an article from Circulation     Arrhythmia- Electrophysiology 2013; [phone].  The left atrial     appendage was well visualized and did not reveal a left atrial     thrombus. 3. Successful cardioversion to sinus rhythm. 4. The patient did have a left bundle-branch block with a mild     prolongation of his HV interval.  We will need to follow clinically     for AV block. 5. No early apparent complications.     Thompson Grayer, MD     JA/MEDQ  D:  10/16/2013  T:  10/16/2013  Job:  [id number]  cc:   Loralie Champagne, MD

## 2013-10-19 NOTE — Discharge Instructions (Signed)
Anticoagulation, Generic  Anticoagulants are medicines used to prevent clots from developing in your veins. These medicine are also known as blood thinners. If blood clots are untreated, they could travel to your lungs. This is called a pulmonary embolus. A blood clot in your lungs can be fatal.   Health care providers often use anticoagulants to prevent clots following surgery. Anticoagulants are also used along with aspirin when the heart is not getting enough blood.  Another anticoagulant called warfarin is started 2 to 3 days after a rapid-acting injectable anticoagulant is started. The rapid-acting anticoagulants are usually continued until warfarin has begun to work. Your health care provider will judge this length of time by blood tests known as the prothrombin time (PT) and International Normalization Ratio (INR). This means that your blood is at the necessary and best level to prevent clots.  RISKS AND COMPLICATIONS  · If you have received recent epidural anesthesia, spinal anesthesia, or a spinal tap while receiving anticoagulants, you are at risk for developing a blood clot in or around the spine. This condition could result in long-term or permanent paralysis.  · Because anticoagulants thin your blood, severe bleeding may occur from any tissue or organ. Symptoms of the blood being too thin may include:  ¨ Bleeding from the nose or gums that does not stop quickly.  ¨ Blood in bowel movements which may appear as bright red, dark, or black tarry stools.  ¨ Blood in the urine which may appear as pink, red, or brown urine.  ¨ Unusual bruising or bruising easily.  ¨ A cut that does not stop bleeding within 10 minutes.  ¨ Vomiting blood or continuous nausea for more than 1 day.  ¨ Coughing up blood.  ¨ Broken blood vessels in your eye (subconjunctival hemorrhage).  ¨ Abdominal or back pain with or without flank bruising.  ¨ Sudden, severe headache.  ¨ Sudden weakness or numbness of the face, arm, or leg,  especially on one side of the body.  ¨ Sudden confusion.  ¨ Trouble speaking (aphasia) or understanding.  ¨ Sudden trouble seeing in one or both eyes.  ¨ Sudden trouble walking.  ¨ Dizziness.  ¨ Loss of balance or coordination.  ¨ Vaginal bleeding.  ¨ Swelling or pain at an injection site.  ¨ Superficial fat tissue death (necrosis) which may cause skin scarring. This is more common in women and may first present as pain in the waist, thighs, or buttocks.  ¨ Fever.  · Too little anticoagulation continues to allow the risk for blood clots.  HOME CARE INSTRUCTIONS   · Due to the complications of anticoagulants, it is very important that you take your anticoagulant as directed by your health care provider. Anticoagulants need to be taken exactly as instructed. Be sure you understand all your anticoagulant instructions.  · Keep all follow-up appointments with your health care provider as directed. It is very important to keep your appointments. Not keeping appointments could result in a chronic or permanent injury, pain, or disability.  · Warfarin. Your health care provider will advise you on the length of treatment (usually 3-6 months, sometimes lifelong).  ¨ Take warfarin exactly as directed by your health care provider. It is recommended that you take your warfarin dose at the same time of the day. It is preferred that you take warfarin in the late afternoon. If you have been told to stop taking warfarin, do not resume taking warfarin until directed to do so by your health care   provider. Follow your health care provider's instructions if you accidentally take an extra dose or miss a dose of warfarin. It is very important to take warfarin as directed since bleeding or blood clots could result in chronic or permanent injury, pain, or disability.  ¨ Too much and too little warfarin are both dangerous. Too much warfarin increases the risk of bleeding. Too little warfarin continues to allow the risk for blood clots. While  taking warfarin, you will need to have regular blood tests to measure your blood clotting time. These blood tests usually include both the prothrombin time (PT) and International Normalized Ratio (INR) tests. The PT and INR results allow your health care provider to adjust your dose of warfarin. The dose can change for many reasons. It is critically important that you have your PT and INR levels drawn exactly as directed. Your warfarin dose may stay the same or change depending on what the PT and INR results are. Be sure to follow up with your health care provider regarding your PT and INR test results and what your warfarin dosage should be.  ¨ Many medicines can interfere with warfarin and affect the PT and INR results. You must tell your health care provider about any and all medicines you take, this includes all vitamins and supplements. Ask your health care provider before taking these. Prescription and over-the-counter medicine consistency is critical to warfarin management. It is important that potential interactions are checked before you start a new medicine. Be especially cautious with aspirin and anti-inflammatory medicines. Ask your health care provider before taking these. Medicines such as antibiotics and acid-reducing medicine can interact with warfarin and can cause an increased warfarin effect. Warfarin can also interfere with the effectiveness of medicines you are taking. Do not take or discontinue any prescribed or over-the-counter medicine except on the advice of your health care provider or pharmacist.  ¨ Some vitamins, supplements, and herbal products interfere with the effectiveness of warfarin. Vitamin E may increase the anticoagulant effects of warfarin. Vitamin K may can cause warfarin to be less effective. Do not take or discontinue any vitamin, supplement, or herbal product except on the advice of your health care provider or pharmacist.  ¨ Eat what you normally eat and keep the vitamin K  content of your diet consistent. Avoid major changes in your diet, or notify your health care provider before changing your diet. Suddenly getting a lot more vitamin K could cause your blood to clot too quickly. A sudden decrease in vitamin K intake could cause your blood to clot too slowly. These changes in vitamin K intake could lead to dangerous blood clots or to bleeding. To keep your vitamin K intake consistent, you must be aware of which foods contain moderate or high amounts of vitamin K. Some foods high in vitamin K include spinach, kale, broccoli, cabbage, greens, Brussels sprouts, asparagus, [place]oy, coleslaw, parsley, and green tea. Arrange a visit with a dietitian to answer your questions.  ¨ If you have a loss of appetite or get the stomach flu (viral gastroenteritis), talk to your health care provider as soon as possible. A decrease in your normal vitamin K intake can make you more sensitive to your usual dose of warfarin.  ¨ Some medical conditions may increase your risk for bleeding while you are taking warfarin. A fever, diarrhea lasting more than a day, worsening heart failure, or worsening liver function are some medical conditions that could affect warfarin. Contact your health care provider if   you have any of these medical conditions.  ¨ Alcohol can change the body's ability to handle warfarin. It is best to avoid alcoholic drinks or consume only very small amounts while taking warfarin. Notify your health care provider if you change your alcohol intake. A sudden increase in alcohol use can increase your risk of bleeding. Chronic alcohol use can cause warfarin to be less effective.  · Be careful not to cut yourself when using sharp objects or while shaving.  · Inform all your health care providers and your dentist that you take an anticoagulant.  · Limit physical activities or sports that could result in a fall or cause injury. Avoid contact sports.  · Wear medical alert jewelry or carry a  medical alert card.  SEEK IMMEDIATE MEDICAL CARE IF:  · You cough up blood.  · You have dark or black stools or there is bright red blood coming from your rectum.  · You vomit blood or have nausea for more than 1 day.  · You have blood in the urine or pink colored urine.  · You have unusual bruising or have increased bruising.  · You have bleeding from the nose or gums that does not stop quickly.  · You have a cut that does not stop bleeding within a 2-3 minutes.  · You have sudden weakness or numbness of the face, arm, or leg, especially on one side of the body.  · You have sudden confusion.  · You have trouble speaking (aphasia) or understanding.  · You have sudden trouble seeing in one or both eyes.  · You have sudden trouble walking.  · You have dizziness.  · You have a loss of balance or coordination.  · You have a sudden, severe headache.  · You have a serious fall or head injury, even if you are not bleeding.  · You have swelling or pain at an injection site.  · You have unexplained tenderness or pain in the abdomen, back, waist, thighs or buttocks.  · You have a fever.  Any of these symptoms may represent a serious problem that is an emergency. Do not wait to see if the symptoms will go away. Get medical help right away. Call your local emergency services (911 in [place] Do not drive yourself to the hospital.  Document Released: 03/05/2005 Document Revised: 03/10/2013 Document Reviewed: 10/08/2007  [employer]® Patient Information ©2015 [employer]. This information is not intended to replace advice given to you by your health care provider. Make sure you discuss any questions you have with your health care provider.

## 2013-10-19 NOTE — Progress Notes (Addendum)
Patient Name: Walter [name] Date of Encounter: 10/19/2013     Active Problems:   Essential hypertension, benign   Restless leg syndrome   Non-occlusive coronary artery disease   Atrial fibrillation with RVR   Abnormal cardiovascular function study   Cardiomyopathy, nonischemic   Esophageal spasm    SUBJECTIVE  The patient is maintaining normal sinus rhythm.  He is anxious for discharge. He previously had a Foley catheter.  He is complaining of some dysuria which is gradually getting better.  A urinalysis on Saturday shows pyuria.  However no culture was done.  The patient is not allergic to any antibiotics.  CURRENT MEDS . amiodarone  400 mg Oral BID  . cholecalciferol  1,000 Units Oral Daily  . digoxin  0.125 mg Oral Daily  . furosemide  40 mg Oral Daily  . insulin aspart  0-15 Units Subcutaneous TID WC  . lisinopril  10 mg Oral Daily  . metoprolol succinate  25 mg Oral Daily  . omega-3 acid ethyl esters  1 g Oral BID  . pantoprazole  40 mg Oral Daily  . phenazopyridine  100 mg Oral TID WC  . potassium chloride  40 mEq Oral Daily  . rOPINIRole  1 mg Oral QHS  . sodium chloride  3 mL Intravenous Q12H  . warfarin   Does not apply Once  . Warfarin - Pharmacist Dosing Inpatient   Does not apply q1800    OBJECTIVE  Filed Vitals:   10/18/13 0632 10/18/13 1401 10/19/13 0233 10/19/13 0516  BP: 124/73 142/84 120/70 119/73  Pulse: 61 65 57 53  Temp: 98 F (36.7 C) 98 F (36.7 C) 98.4 F (36.9 C) 98.3 F (36.8 C)  TempSrc: Oral Oral Oral Oral  Resp: 18 20 18 19   Height:      Weight: 242 lb 8.1 oz (110 kg)   240 lb 6.4 oz (109.045 kg)  SpO2: 97% 95% 95% 97%    Intake/Output Summary (Last 24 hours) at 10/19/13 0753 Last data filed at 10/19/13 0521  Gross per 24 hour  Intake  815.1 ml  Output   2700 ml  Net -1884.9 ml   Filed Weights   10/17/13 1215 10/18/13 0632 10/19/13 0516  Weight: 244 lb 11.4 oz (111 kg) 242 lb 8.1 oz (110 kg) 240 lb 6.4 oz (109.045 kg)     PHYSICAL EXAM  General: Pleasant, NAD. Neuro: Alert and oriented X 3. Moves all extremities spontaneously. Psych: Normal affect. HEENT:  Normal  Neck: Supple without bruits or JVD. Lungs:  Resp regular and unlabored, CTA. Heart: RRR no s3, s4, or murmurs. Abdomen: Soft, non-tender, non-distended, BS + x 4.  Extremities: No clubbing, cyanosis or edema. DP/PT/Radials 2+ and equal bilaterally.  Accessory Clinical Findings  CBC  Recent Labs  10/18/13 0349 10/19/13 0328  WBC 6.7 6.7  HGB 16.7 16.7  HCT 49.7 49.4  MCV 96.3 96.7  PLT 170 272   Basic Metabolic Panel  Recent Labs  10/17/13 0330 10/18/13 0349  NA 140 136*  K 4.1 4.0  CL 98 98  CO2 27 26  GLUCOSE 111* 118*  BUN 23 22  CREATININE 1.15 1.19  CALCIUM 9.0 8.9   Liver Function Tests No results found for this basename: AST, ALT, ALKPHOS, BILITOT, PROT, ALBUMIN,  in the last 72 hours No results found for this basename: LIPASE, AMYLASE,  in the last 72 hours Cardiac Enzymes No results found for this basename: CKTOTAL, CKMB, CKMBINDEX, TROPONINI,  in the  last 72 hours BNP No components found with this basename: POCBNP,  D-Dimer No results found for this basename: DDIMER,  in the last 72 hours Hemoglobin A1C No results found for this basename: HGBA1C,  in the last 72 hours Fasting Lipid Panel No results found for this basename: CHOL, HDL, LDLCALC, TRIG, CHOLHDL, LDLDIRECT,  in the last 72 hours Thyroid Function Tests No results found for this basename: TSH, T4TOTAL, FREET3, T3FREE, THYROIDAB,  in the last 72 hours  TELE  Off telemetry since yesterday  ECG    Radiology/Studies  Nm Myocar Multi W/spect W/wall Motion / Ef  10/10/2013   CLINICAL DATA:  68 year old with current history of nonischemic cardiomyopathy and previous nonobstructive coronary artery disease on outside heart catheterization in 2011, presenting with recent onset of atrial fibrillation and rapid ventricular response.  EXAM:  MYOCARDIAL IMAGING WITH SPECT (REST AND PHARMACOLOGIC-STRESS)  GATED LEFT VENTRICULAR WALL MOTION STUDY  LEFT VENTRICULAR EJECTION FRACTION  TECHNIQUE: Standard myocardial SPECT imaging was performed after resting intravenous injection of 10 mCi Tc-26m sestamibi. Subsequently, intravenous infusion of Lexiscan was performed under the supervision of the Cardiology staff. At peak effect of the drug, 30 mCi Tc-45m sestamibi was injected intravenously and standard myocardial SPECT imaging was performed. Quantitative gated imaging was also performed to evaluate left ventricular wall motion, and estimate left ventricular ejection fraction.  COMPARISON:  None.  FINDINGS: Immediate post regadenoson images demonstrate diminished activity in the inferior wall, apex, and inferolateral wall. Activity in the anterior wall is normal. Initial resting images demonstrate similar findings. No evidence of reversibility to suggest ischemia. Findings confirmed by the computer generated polar map.  Gated images demonstrate severe global hypokinesis with essential akinesis in the inferior wall and apex.  Estimated QGS left ventricular ejection fraction measured 18%, with an end-diastolic volume of 242 ml and an end systolic volume of 353 ml.  IMPRESSION: 1. Inferior wall, apical and inferolateral wall MI. No evidence of myocardial ischemia. 2. Severe global hypokinesis with a central akinesis in the inferior wall and apex. 3. Estimated QGS ejection fraction 18%. 4. Left ventricular enlargement.   Electronically Signed   By: Evangeline Dakin M.D.   On: 10/10/2013 12:47   Dg Chest Port 1 View  10/09/2013   CLINICAL DATA:  Shortness of breath. Abdominal pain. Weakness. Hypertension. Coronary artery disease.  EXAM: PORTABLE CHEST - 1 VIEW  COMPARISON:  CT chest 04/16/2013.  Chest x-ray 02/11/2013.  FINDINGS: Mediastinum and hilar structures are normal. Cardiomegaly. Normal pulmonary vascularity. Calcified pleural plaques are present  consistent with prior asbestos exposure. No pleural effusion or pneumothorax. No acute bony abnormality.  IMPRESSION: 1. Cardiomegaly, no CHF. 2. Calcified pleural plaques. This is consistent with prior asbestos exposure. No change from prior chest CT of 04/16/2013 and chest x-ray of 02/11/2013. No acute pulmonary disease.   Electronically Signed   By: Marcello Moores  Register   On: 10/09/2013 13:52   Dg Esophagus W/water Sol Cm  10/14/2013   CLINICAL DATA:  Inability to pass transesophageal probe ; occasional dysphagia with solid material  EXAM: ESOPHOGRAM/BARIUM SWALLOW  TECHNIQUE: Single contrast examination was performed using water-soluble contrast, 150 mL Omnipaque 300, and thin barium. A 13 mm barium tablet was also administered.  FLUOROSCOPY TIME:  1 min 27 seconds  COMPARISON:  None.  FINDINGS: The patient swallows readily. There is occasional esophageal dysmotility with mild tertiary contractions. There is no appreciable hiatal hernia or demonstrable reflux. There is no stricture, mass, or ulceration. No pharyngeal lesions are appreciable.  A 13 mm barium tablet passed freely to the level of the distal esophagus where there was spasm. The tablet passed through the distal esophagus into the stomach after several boluses of water.  IMPRESSION: Intermittent esophageal dysmotility with spasm distally with solid material. No esophageal mass, ulceration, or stricture seen. Study otherwise unremarkable.   Electronically Signed   By: Lowella Grip M.D.   On: 10/14/2013 08:45    ASSESSMENT AND PLAN 1. Atrial fibrillation: Maintaining sinus rhythm after cardioversion with intracardiac echo which ruled out LAA thrombus by Dr. Rayann Heman. Switched IV amiodarone to oral on 8/1. Started Toprol-XL 25 mg daily on 8/1 as well. Pharmacy to dose warfarin with heparin for bridging. INR remains subtherapeutic. Also on digoxin and lisinopril. Has purported tachycardia-mediated cardiomyopathy.  Have reduced digoxin to 0.125 mg  daily. Possibly switch to target-specific anticoagulant after one month.  Pt willing to attempt Lovenox for bridging.  We'll use outpatient bridging with Lovenox. 2. Acute on chronic systolic CHF: EF down to 26% from 50% in 3/15. LHC/RHC 7/27 with mildly elevated filling pressures and mild nonobstructive CAD. 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  - Continue lisinopril and Toprol XL.  - Will reduce Lasix to 40 mg po daily. Greater than 3 liters output in last 48 hours.  -Will need repeat echo to assess for interval improvement in LV systolic function in 3 months.  3. Chronic LBBB  4. Dysuria: Improving slowly but still present.  Patient is concerned.  We will obtain urine culture today prior to discharge and send him home on 3 days of amoxicillin 500 mg 3 times a day after culture has been sent. 5. IDDM: Stable.  Plan: Okay for discharge today.  Arrange for Lovenox bridging.  Plan to recheck INR at [facility] on August 4.  Cardiology followup with Dr. Aundra Dubin   Signed, Darlin Coco MD

## 2013-10-19 NOTE — Progress Notes (Signed)
ANTICOAGULATION CONSULT NOTE - Follow Up Consult  Pharmacy Consult for Heparin Indication: afib, Factor V Leiden  Allergies  Allergen Reactions  . Statins     Myalgias.  Can only take Altoprev     Patient Measurements: Height: 6' (182.9 cm) Weight: 240 lb 6.4 oz (109.045 kg) IBW/kg (Calculated) : 77.6 Heparin Dosing Weight: 102 kg  Vital Signs: Temp: 98.3 F (36.8 C) (08/03 0516) Temp src: Oral (08/03 0516) BP: 119/73 mmHg (08/03 0516) Pulse Rate: 53 (08/03 0516)  Labs:  Recent Labs  10/17/13 0330 10/17/13 0741 10/18/13 0349 10/18/13 1030 10/19/13 0328  HGB 17.8*  --  16.7  --  16.7  HCT 53.2*  --  49.7  --  49.4  PLT 187  --  170  --  179  LABPROT  --  17.4* 17.8*  --  20.5*  INR  --  1.42 1.47  --  1.76*  HEPARINUNFRC  --  0.34 0.26* 0.42 0.30  CREATININE 1.15  --  1.19  --   --     Estimated Creatinine Clearance: 74.7 ml/min (by C-G formula based on Cr of 1.19).   Assessment: Abd pain, diarrhea. Presented in AFib  68 yo m who presented on 7/24 with a fib with RVR. Will likely switch him to NOAC a month after DCCV 7/31. 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 CHADSVASC score is 4. Heparin level 0.3 which is lower end of goal. INR up to 1.76 (dose held 7/29 for EGD)   Goal of Therapy:  Heparin level 0.3-0.7 units/ml Monitor platelets by anticoagulation protocol: Yes   Plan:  Consider discharge on Coumadin 10 mg daily. F/u INR in 2-3 days  Increase IV heparin to 1850 units/hr Consider Lovenox 110 mg [place] twice daily. Stop Heparin drip 1 hour prior to first dose of Lovenox.  Monitor for s/s of bleeding   Albertina Parr, PharmD.  Clinical Pharmacist Pager [phone]

## 2013-10-19 NOTE — Progress Notes (Signed)
Patient is A/Ox4 and is ambulatory without assist. He had no c/o pain and no signs of distress. IV taken out.Discharge teaching was done with pt.and his wife. Explained to patient how to administer Lovenox injection to himself. Pt.tranpsorted to private vehicle by wheelchair accompanied by hospital volunteer.

## 2013-10-20 ENCOUNTER — Ambulatory Visit (INDEPENDENT_AMBULATORY_CARE_PROVIDER_SITE_OTHER): Payer: Medicare Other | Admitting: Pharmacist Clinician (PhC)/ Clinical Pharmacy Specialist

## 2013-10-20 ENCOUNTER — Telehealth: Payer: Self-pay | Admitting: *Deleted

## 2013-10-20 DIAGNOSIS — Z7901 Long term (current) use of anticoagulants: Secondary | ICD-10-CM

## 2013-10-20 DIAGNOSIS — I4891 Unspecified atrial fibrillation: Secondary | ICD-10-CM

## 2013-10-20 LAB — POCT INR: INR: 2.4

## 2013-10-20 NOTE — Telephone Encounter (Signed)
LMTCB

## 2013-10-20 NOTE — Telephone Encounter (Signed)
Pt.notified

## 2013-10-20 NOTE — Telephone Encounter (Signed)
Message copied by Katrine Coho on Tue Oct 20, 2013 11:28 AM ------      Message from: Donalda Ewings      Created: Tue Oct 20, 2013 11:15 AM      Regarding: Patient question       Contact: [phone]       Is he supposed to be on fluid restrictions or not? One doctor told him at the hospital that he was and another said he was not, he and his wife are confused please advise.                                                                                                      / ------

## 2013-10-20 NOTE — Progress Notes (Signed)
Pt discharged 8/3 from hospital, dx A Fib. Had L and R heart caths, coronary angiogram and guided cardioversion.  Sent home after 2 doses of warfarin (7.5mg  and 10mg ) on 10mg  daily.  Also sent home on SMX/TMP DS bid x 3 days.  Will take last dose of antibiotic Wed 8/5 in the evening.    Reviewed with patient dosing information, side effects, safety issues, vitamin K, OTC pain relief.  Also reviewed medication list to help patient determine AM or PM dosing of all meds.    Answered all questions, pt to return Friday.  INR today 2.4 after 3 doses (27.5mg  total).  May need lower strength tablets, unsure as to how much INR affected by sulfa.

## 2013-10-20 NOTE — Telephone Encounter (Signed)
Keep under 2L daily.

## 2013-10-20 NOTE — Telephone Encounter (Signed)
Pt asking if he should follow a daily fluid restriction.   I will forward to Dr Aundra Dubin for review.

## 2013-10-21 LAB — URINE CULTURE: Colony Count: >=100000

## 2013-10-22 ENCOUNTER — Telehealth: Payer: Self-pay | Admitting: *Deleted

## 2013-10-22 MED ORDER — NITROFURANTOIN MONOHYD MACRO 100 MG PO CAPS: 100.0000 mg | ORAL_CAPSULE | Freq: Two times a day (BID) | ORAL | Status: DC

## 2013-10-22 NOTE — Telephone Encounter (Signed)
Message copied by Katrine Coho on Thu Oct 22, 2013  2:02 PM ------      Message from: Larey Dresser      Created: Thu Oct 22, 2013  1:39 PM       Yes, use nitrofurantoin.       ----- Message -----         From: Katrine Coho, RN         Sent: 10/22/2013   8:54 AM           To: Larey Dresser, MD            Is this what you want me to do? Thanks.      ----- Message -----         From: Maralyn Sago Smart, RPH         Sent: 10/22/2013   8:43 AM           To: Katrine Coho, RN            Given on amiodarone and QTc 480 msec, I would not choose Cipro as my first choice given its potential to prolong QTc as well.  It appears the e.coli is sensitive to nitrofurantoin, so I would recommend nitrofurantoin 100 mg bid x 7 days instead.            Ysidro Evert            ----- Message -----         From: Katrine Coho, RN         Sent: 10/22/2013   8:25 AM           To: [facility]            He was given Bactrim for UTI while he was in the hospital. Dr Aundra Dubin wants to change him to Cipro because urine culture shows resistant to Bactrim.             Dr Aundra Dubin asked me to check with a pharmacist to check on any significant drug interactions. In addition to any other potential interactions he is on warfarin and is scheduled in [facility] tomorrow.             Thanks.                    ------

## 2013-10-22 NOTE — Telephone Encounter (Signed)
done

## 2013-10-23 ENCOUNTER — Ambulatory Visit (INDEPENDENT_AMBULATORY_CARE_PROVIDER_SITE_OTHER): Payer: Medicare Other | Admitting: *Deleted

## 2013-10-23 ENCOUNTER — Encounter: Payer: Self-pay | Admitting: Cardiovascular Disease

## 2013-10-23 DIAGNOSIS — I4891 Unspecified atrial fibrillation: Secondary | ICD-10-CM | POA: Diagnosis not present

## 2013-10-23 DIAGNOSIS — Z7901 Long term (current) use of anticoagulants: Secondary | ICD-10-CM | POA: Diagnosis not present

## 2013-10-23 LAB — POCT INR: INR: 2.3

## 2013-10-26 ENCOUNTER — Encounter: Payer: Self-pay | Admitting: Nurse Practitioner

## 2013-10-26 ENCOUNTER — Other Ambulatory Visit: Payer: Medicare Other

## 2013-10-28 ENCOUNTER — Ambulatory Visit (INDEPENDENT_AMBULATORY_CARE_PROVIDER_SITE_OTHER): Payer: Medicare Other | Admitting: Surgery

## 2013-10-28 DIAGNOSIS — I4891 Unspecified atrial fibrillation: Secondary | ICD-10-CM

## 2013-10-28 DIAGNOSIS — D239 Other benign neoplasm of skin, unspecified: Secondary | ICD-10-CM | POA: Diagnosis not present

## 2013-10-28 DIAGNOSIS — Z7901 Long term (current) use of anticoagulants: Secondary | ICD-10-CM | POA: Diagnosis not present

## 2013-10-28 DIAGNOSIS — L723 Sebaceous cyst: Secondary | ICD-10-CM | POA: Diagnosis not present

## 2013-10-28 DIAGNOSIS — D485 Neoplasm of uncertain behavior of skin: Secondary | ICD-10-CM | POA: Diagnosis not present

## 2013-10-28 DIAGNOSIS — D2370 Other benign neoplasm of skin of unspecified lower limb, including hip: Secondary | ICD-10-CM | POA: Diagnosis not present

## 2013-10-28 LAB — POCT INR: INR: 4.1

## 2013-10-29 ENCOUNTER — Telehealth: Payer: Self-pay | Admitting: Cardiology

## 2013-10-29 NOTE — Telephone Encounter (Signed)
Wife reports pt has 3 more doses of nitrofurantoin and continues burning on urination. Discussed with Ysidro Evert, PhD., recommends additional 3 days of nitrofurantoin 100 mg bid. If after completes a total of 10 days, if still has symptoms, follow up with PCP. Called [facility] to provide 6 more tabs. Informed pts wife. She verbalizes agreement and understanding.

## 2013-10-29 NOTE — Telephone Encounter (Signed)
New message     Pt is to finish antibiotic tomorrow for a UTI.  He is still having burning when he urinates.  Please advise

## 2013-11-02 DIAGNOSIS — R972 Elevated prostate specific antigen [PSA]: Secondary | ICD-10-CM | POA: Diagnosis not present

## 2013-11-04 ENCOUNTER — Ambulatory Visit (INDEPENDENT_AMBULATORY_CARE_PROVIDER_SITE_OTHER): Payer: Medicare Other | Admitting: Surgery

## 2013-11-04 DIAGNOSIS — Z7901 Long term (current) use of anticoagulants: Secondary | ICD-10-CM | POA: Diagnosis not present

## 2013-11-04 DIAGNOSIS — I4891 Unspecified atrial fibrillation: Secondary | ICD-10-CM

## 2013-11-04 LAB — POCT INR: INR: 3.2

## 2013-11-05 DIAGNOSIS — R300 Dysuria: Secondary | ICD-10-CM | POA: Diagnosis not present

## 2013-11-11 DIAGNOSIS — R300 Dysuria: Secondary | ICD-10-CM | POA: Diagnosis not present

## 2013-11-11 DIAGNOSIS — IMO0002 Reserved for concepts with insufficient information to code with codable children: Secondary | ICD-10-CM | POA: Diagnosis not present

## 2013-11-11 DIAGNOSIS — G894 Chronic pain syndrome: Secondary | ICD-10-CM | POA: Diagnosis not present

## 2013-11-11 DIAGNOSIS — M5137 Other intervertebral disc degeneration, lumbosacral region: Secondary | ICD-10-CM | POA: Diagnosis not present

## 2013-11-11 DIAGNOSIS — N419 Inflammatory disease of prostate, unspecified: Secondary | ICD-10-CM | POA: Diagnosis not present

## 2013-11-11 DIAGNOSIS — Z79899 Other long term (current) drug therapy: Secondary | ICD-10-CM | POA: Diagnosis not present

## 2013-11-14 ENCOUNTER — Encounter (HOSPITAL_BASED_OUTPATIENT_CLINIC_OR_DEPARTMENT_OTHER): Payer: Self-pay | Admitting: Emergency Medicine

## 2013-11-14 ENCOUNTER — Emergency Department (HOSPITAL_BASED_OUTPATIENT_CLINIC_OR_DEPARTMENT_OTHER)
Admission: EM | Admit: 2013-11-14 | Discharge: 2013-11-14 | Disposition: A | Discharge status: Home / Self Care | Payer: Medicare Other | Attending: Emergency Medicine | Admitting: Emergency Medicine

## 2013-11-14 ENCOUNTER — Telehealth: Payer: Self-pay | Admitting: Physician Assistant

## 2013-11-14 DIAGNOSIS — I4891 Unspecified atrial fibrillation: Secondary | ICD-10-CM | POA: Insufficient documentation

## 2013-11-14 DIAGNOSIS — Z862 Personal history of diseases of the blood and blood-forming organs and certain disorders involving the immune mechanism: Secondary | ICD-10-CM | POA: Insufficient documentation

## 2013-11-14 DIAGNOSIS — M1990 Unspecified osteoarthritis, unspecified site: Secondary | ICD-10-CM | POA: Diagnosis not present

## 2013-11-14 DIAGNOSIS — E669 Obesity, unspecified: Secondary | ICD-10-CM | POA: Insufficient documentation

## 2013-11-14 DIAGNOSIS — I2510 Atherosclerotic heart disease of native coronary artery without angina pectoris: Secondary | ICD-10-CM | POA: Insufficient documentation

## 2013-11-14 DIAGNOSIS — E119 Type 2 diabetes mellitus without complications: Secondary | ICD-10-CM | POA: Diagnosis not present

## 2013-11-14 DIAGNOSIS — Z8547 Personal history of malignant neoplasm of testis: Secondary | ICD-10-CM | POA: Insufficient documentation

## 2013-11-14 DIAGNOSIS — Z87891 Personal history of nicotine dependence: Secondary | ICD-10-CM | POA: Diagnosis not present

## 2013-11-14 DIAGNOSIS — K219 Gastro-esophageal reflux disease without esophagitis: Secondary | ICD-10-CM | POA: Diagnosis not present

## 2013-11-14 DIAGNOSIS — Z8709 Personal history of other diseases of the respiratory system: Secondary | ICD-10-CM | POA: Diagnosis not present

## 2013-11-14 DIAGNOSIS — R319 Hematuria, unspecified: Secondary | ICD-10-CM | POA: Diagnosis not present

## 2013-11-14 DIAGNOSIS — G8929 Other chronic pain: Secondary | ICD-10-CM | POA: Diagnosis not present

## 2013-11-14 DIAGNOSIS — Z7901 Long term (current) use of anticoagulants: Secondary | ICD-10-CM | POA: Insufficient documentation

## 2013-11-14 DIAGNOSIS — Z9889 Other specified postprocedural states: Secondary | ICD-10-CM | POA: Insufficient documentation

## 2013-11-14 DIAGNOSIS — I10 Essential (primary) hypertension: Secondary | ICD-10-CM | POA: Diagnosis not present

## 2013-11-14 DIAGNOSIS — Z7982 Long term (current) use of aspirin: Secondary | ICD-10-CM | POA: Insufficient documentation

## 2013-11-14 DIAGNOSIS — Z79899 Other long term (current) drug therapy: Secondary | ICD-10-CM | POA: Diagnosis not present

## 2013-11-14 DIAGNOSIS — Z791 Long term (current) use of non-steroidal anti-inflammatories (NSAID): Secondary | ICD-10-CM | POA: Diagnosis not present

## 2013-11-14 DIAGNOSIS — N390 Urinary tract infection, site not specified: Secondary | ICD-10-CM | POA: Diagnosis not present

## 2013-11-14 LAB — PROTIME-INR
INR: 3.29 — ABNORMAL HIGH (ref 0.00–1.49)
Prothrombin Time: 33.5 seconds — ABNORMAL HIGH (ref 11.6–15.2)

## 2013-11-14 LAB — URINALYSIS, ROUTINE W REFLEX MICROSCOPIC
BILIRUBIN URINE: NEGATIVE
GLUCOSE, UA: NEGATIVE mg/dL
KETONES UR: 15 mg/dL — AB
Nitrite: POSITIVE — AB
PH: 5 (ref 5.0–8.0)
Protein, ur: NEGATIVE mg/dL
Specific Gravity, Urine: 1.017 (ref 1.005–1.030)
Urobilinogen, UA: 1 mg/dL (ref 0.0–1.0)

## 2013-11-14 LAB — URINE MICROSCOPIC-ADD ON

## 2013-11-14 MED ORDER — CEPHALEXIN 500 MG PO CAPS: 1000.0000 mg | ORAL_CAPSULE | Freq: Two times a day (BID) | ORAL | Status: DC

## 2013-11-14 MED ORDER — LIDOCAINE HCL (PF) 1 % IJ SOLN
INTRAMUSCULAR | Status: AC
  Administered 2013-11-14: 5 mL
  Filled 2013-11-14: qty 5

## 2013-11-14 MED ORDER — CEFTRIAXONE SODIUM 1 G IJ SOLR
1.0000 g | Freq: Once | INTRAMUSCULAR | Status: AC
  Administered 2013-11-14: 1 g via INTRAMUSCULAR
  Filled 2013-11-14: qty 10

## 2013-11-14 NOTE — Telephone Encounter (Signed)
Patient's wife called Saturday with question. Per her report the patient has recently been followed with urology to treat a UTI with 4 different antibiotics and it has gone into his prostate. He was put on Bactrim DS twice a day to prevent sepsis. Today he began urinating blood. She was wondering if he needs to hold his Coumadin since that's what they did on 11/04/13 because INR was supratherapeutic at 3.2. He was not urinating blood or having any bleeding issues at that time. This just started today. I am worried his INR is markedly supratherapeutic and told her there's no way to really know what to do with his Coumadin until we know the level. I also feel that his hematuria should be evaluated as well in case there is underlying pathology. I advised they proceed to either urgent care or ER - as a happy medium I suggested [facility] since they live in [place]. This facility will have access to [facility] records and be able to run stat labs. His wife verbalized understanding of advice and they plan to proceed. Gerado Nabers PA-C

## 2013-11-14 NOTE — Discharge Instructions (Signed)
Hematuria Hematuria is blood in your urine. It can be caused by a bladder infection, kidney infection, prostate infection, kidney stone, or cancer of your urinary tract. Infections can usually be treated with medicine, and a kidney stone usually will pass through your urine. If neither of these is the cause of your hematuria, further workup to find out the reason may be needed. It is very important that you tell your health care provider about any blood you see in your urine, even if the blood stops without treatment or happens without causing pain. Blood in your urine that happens and then stops and then happens again can be a symptom of a very serious condition. Also, pain is not a symptom in the initial stages of many urinary cancers. HOME CARE INSTRUCTIONS   Drink lots of fluid, 3-4 quarts a day. If you have been diagnosed with an infection, cranberry juice is especially recommended, in addition to large amounts of water.  Avoid caffeine, tea, and carbonated beverages because they tend to irritate the bladder.  Avoid alcohol because it may irritate the prostate.  Take all medicines as directed by your health care provider.  If you were prescribed an antibiotic medicine, finish it all even if you start to feel better.  If you have been diagnosed with a kidney stone, follow your health care provider's instructions regarding straining your urine to catch the stone.  Empty your bladder often. Avoid holding urine for long periods of time.  After a bowel movement, women should cleanse front to back. Use each tissue only once.  Empty your bladder before and after sexual intercourse if you are a male. SEEK MEDICAL CARE IF:  You develop back pain.  You have a fever.  You have a feeling of sickness in your stomach (nausea) or vomiting.  Your symptoms are not better in 3 days. Return sooner if you are getting worse. SEEK IMMEDIATE MEDICAL CARE IF:   You develop severe vomiting and are  unable to keep the medicine down.  You develop severe back or abdominal pain despite taking your medicines.  You begin passing a large amount of blood or clots in your urine.  You feel extremely weak or faint, or you pass out. MAKE SURE YOU:   Understand these instructions.  Will watch your condition.  Will get help right away if you are not doing well or get worse. Document Released: 03/05/2005 Document Revised: 07/20/2013 Document Reviewed: 11/03/2012 [facility] Patient Information 2015 [place], [place]. This information is not intended to replace advice given to you by your health care provider. Make sure you discuss any questions you have with your health care provider.  Urinary Tract Infection Urinary tract infections (UTIs) can develop anywhere along your urinary tract. Your urinary tract is your body's drainage system for removing wastes and extra water. Your urinary tract includes two kidneys, two ureters, a bladder, and a urethra. Your kidneys are a pair of bean-shaped organs. Each kidney is about the size of your fist. They are located below your ribs, one on each side of your spine. CAUSES Infections are caused by microbes, which are microscopic organisms, including fungi, viruses, and bacteria. These organisms are so small that they can only be seen through a microscope. Bacteria are the microbes that most commonly cause UTIs. SYMPTOMS  Symptoms of UTIs may vary by age and gender of the patient and by the location of the infection. Symptoms in young women typically include a frequent and intense urge to urinate and  a painful, burning feeling in the bladder or urethra during urination. Older women and men are more likely to be tired, shaky, and weak and have muscle aches and abdominal pain. A fever may mean the infection is in your kidneys. Other symptoms of a kidney infection include pain in your back or sides below the ribs, nausea, and vomiting. DIAGNOSIS To diagnose a UTI, your  caregiver will ask you about your symptoms. Your caregiver also will ask to provide a urine sample. The urine sample will be tested for bacteria and white blood cells. White blood cells are made by your body to help fight infection. TREATMENT  Typically, UTIs can be treated with medication. Because most UTIs are caused by a bacterial infection, they usually can be treated with the use of antibiotics. The choice of antibiotic and length of treatment depend on your symptoms and the type of bacteria causing your infection. HOME CARE INSTRUCTIONS  If you were prescribed antibiotics, take them exactly as your caregiver instructs you. Finish the medication even if you feel better after you have only taken some of the medication.  Drink enough water and fluids to keep your urine clear or pale yellow.  Avoid caffeine, tea, and carbonated beverages. They tend to irritate your bladder.  Empty your bladder often. Avoid holding urine for long periods of time.  Empty your bladder before and after sexual intercourse.  After a bowel movement, women should cleanse from front to back. Use each tissue only once. SEEK MEDICAL CARE IF:   You have back pain.  You develop a fever.  Your symptoms do not begin to resolve within 3 days. SEEK IMMEDIATE MEDICAL CARE IF:   You have severe back pain or lower abdominal pain.  You develop chills.  You have nausea or vomiting.  You have continued burning or discomfort with urination. MAKE SURE YOU:   Understand these instructions.  Will watch your condition.  Will get help right away if you are not doing well or get worse. Document Released: 12/13/2004 Document Revised: 09/04/2011 Document Reviewed: 04/13/2011 [facility] Patient Information 2015 [place], [place]. This information is not intended to replace advice given to you by your health care provider. Make sure you discuss any questions you have with your health care provider.

## 2013-11-14 NOTE — ED Provider Notes (Signed)
CSN: [id number]     Arrival date & time 11/14/13  1446 History  This chart was scribed for Walter [name], * by Randa Evens, ED Scribe. This patient was seen in room MH11/MH11 and the patient's care was started at 3:56 PM.     Chief Complaint  Patient presents with  . Hematuria   Patient is a 68 y.o. male presenting with hematuria. The history is provided by the patient. No language interpreter was used.  Hematuria   HPI Comments: Walter [name] is a 68 y.o. male who presents to the Emergency Department complaining of hematuria onset 4 days. He states he has associated dysuria. He states he was recently admitted with e-coli and bladder infection that has spread into his prostate. He states that he has been taking numerous antibiotics with no relief. He states that when he was taking Macrobid his symptoms were improving.  States that the bactrim antibiotic interferes with taking coumadin. He also states that he felt as if the bactrim wasn't providing any relief.  He states that he takes the coumadin to prevent sepsis. He states he has an appointment with the urologist September 11th.   Past Medical History  Diagnosis Date  . Essential hypertension   . DJD (degenerative joint disease)     /osteoarthritis  . Frozen shoulder     right  . Chronic left hip pain   . Chronic low back pain     /sciatica- Dr Letta Median [facility])  . Asbestosis     mild  . GERD (gastroesophageal reflux disease)   . Hypogonadism male   . Coronary artery disease, non-occlusive June 2011    Cardiac cath in [facility] following abnormal Myoview  . Left bundle branch block (LBBB) on electrocardiogram     Diagnosed close to 20 years ago  . Obesity   . Factor V Leiden   . Testicular cancer 1972    left  . Type 2 diabetes mellitus without complication 10/6576    Type 2.   . Non-ischemic cardiomyopathy June 2011    EF 45-50%; suspect related to LBBB; repeat Echo March 2015: EF 50- 55%   . Dyslipidemia, goal LDL below 100     On lovastatin (myalgias with Zocor and Lipitor)  . Atrial fibrillation    Past Surgical History  Procedure Laterality Date  . Lymph nodes    . Tonsillectomy Bilateral   . Surgery scrotal / testicular Left   . Appendectomy N/A   . Rotator cuff repair Right   . Cholecystectomy open    . Hernia repair    . Knee arthroscopy Left   . Cardiac catheterization  June 2011    Nonobstructive CAD  . Transthoracic echocardiogram  May 2011    EF 45% with diffuse hypokinesis; septal bounce from LBBB  . Transthoracic echocardiogram  March 2015    EF 50-55%. Mild concentric LVH. Septal dyssynergy from LBBB   . Cardioversion N/A 10/13/2013    Procedure: CARDIOVERSION;  Surgeon: Larey Dresser, MD;  Location: [place];  Service: Cardiovascular;  Laterality: N/A;  . Esophagogastroduodenoscopy N/A 10/15/2013    Procedure: ESOPHAGOGASTRODUODENOSCOPY (EGD);  Surgeon: Gatha Mayer, MD;  Location: [facility] ENDOSCOPY;  Service: Endoscopy;  Laterality: N/A;  bedside  . Cardioversion  10-16-2013    DCCV by [name] following intracardiac echo to rule out LAA thrombus   Family History  Problem Relation Age of Onset  . Hypertension Mother   . Heart disease Mother   .  Heart disease Father   . Hypertension Brother   . Heart attack Maternal Uncle   . Heart disease Paternal Uncle   . Hypertension Brother    History  Substance Use Topics  . Smoking status: Former Smoker    Types: Cigarettes  . Smokeless tobacco: Not on file  . Alcohol Use: Yes     Comment: rare    Review of Systems  Genitourinary: Positive for dysuria and hematuria.  All other systems reviewed and are negative.   Allergies  Statins  Home Medications   Prior to Admission medications   Medication Sig Start Date End Date Taking? Authorizing Provider  meloxicam (MOBIC) 15 MG tablet Take 15 mg by mouth daily.   Yes Historical Provider, MD  albuterol (PROVENTIL HFA;VENTOLIN HFA) 108 (90 BASE)  MCG/ACT inhaler Inhale 2 puffs into the lungs as needed for wheezing or shortness of breath.    Historical Provider, MD  amiodarone (PACERONE) 200 MG tablet Take 1 tablet (200 mg total) by mouth 2 (two) times daily. Start on 10/24/2013 after finishing the 400mg  twice a day of amiodarone (decreased dose) 10/24/13   Almyra Deforest, PA  amiodarone (PACERONE) 400 MG tablet Take 1 tablet (400 mg total) by mouth 2 (two) times daily. After finish this course, dose decrease to 200mg  twice a day 10/19/13   Almyra Deforest, PA  aspirin 81 MG tablet Take 81 mg by mouth 2 (two) times daily.    Historical Provider, MD  celecoxib (CELEBREX) 200 MG capsule Take 200 mg by mouth daily.    Historical Provider, MD  cholecalciferol (VITAMIN D) 1000 UNITS tablet Take 1,000 Units by mouth daily.    Historical Provider, MD  digoxin (LANOXIN) 0.125 MG tablet Take 1 tablet (0.125 mg total) by mouth daily. 10/19/13   Almyra Deforest, PA  esomeprazole (NEXIUM) 40 MG capsule Take 40 mg by mouth daily at 12 noon.    Historical Provider, MD  furosemide (LASIX) 40 MG tablet Take 1 tablet (40 mg total) by mouth daily. 10/19/13   Almyra Deforest, PA  hydrochlorothiazide (HYDRODIURIL) 25 MG tablet Take 25 mg by mouth daily.    Historical Provider, MD  HYDROcodone-acetaminophen (NORCO/VICODIN) 5-325 MG per tablet Take 2 tablets by mouth every 6 (six) hours as needed for moderate pain.    Historical Provider, MD  lisinopril (PRINIVIL,ZESTRIL) 10 MG tablet Take 1 tablet (10 mg total) by mouth daily. 10/19/13   Almyra Deforest, PA  loperamide (IMODIUM) 2 MG capsule Take by mouth as needed for diarrhea or loose stools.    Historical Provider, MD  lovastatin (ALTOPREV) 60 MG 24 hr tablet Take 60 mg by mouth at bedtime.    Historical Provider, MD  magnesium oxide (MAG-OX) 400 MG tablet Take 400 mg by mouth daily.    Historical Provider, MD  metFORMIN (GLUCOPHAGE) 500 MG tablet Take 500 mg by mouth 2 (two) times daily with a meal.    Historical Provider, MD  metoprolol succinate  (TOPROL-XL) 25 MG 24 hr tablet Take 1 tablet (25 mg total) by mouth daily. 10/19/13   Almyra Deforest, PA  nitrofurantoin, macrocrystal-monohydrate, (MACROBID) 100 MG capsule Take 1 capsule (100 mg total) by mouth 2 (two) times daily. 10/22/13   Larey Dresser, MD  omega-3 acid ethyl esters (LOVAZA) 1 G capsule Take 1 g by mouth 2 (two) times daily.    Historical Provider, MD  potassium chloride SA (K-DUR,KLOR-CON) 20 MEQ tablet Take 2 tablets (40 mEq total) by mouth daily. 10/19/13   Isaac Laud  Meng, PA  rOPINIRole (REQUIP) 0.5 MG tablet Take 0.5 mg by mouth daily as needed.    Historical Provider, MD  rOPINIRole (REQUIP) 1 MG tablet Take 1 mg by mouth at bedtime.    Historical Provider, MD  sildenafil (VIAGRA) 100 MG tablet Take 100 mg by mouth daily as needed for erectile dysfunction.    Historical Provider, MD  testosterone cypionate (DEPO-TESTOSTERONE) 200 MG/ML injection Inject into the muscle every 7 (seven) days.    Historical Provider, MD  tiZANidine (ZANAFLEX) 4 MG capsule Take 4 mg by mouth as needed for muscle spasms.    Historical Provider, MD  warfarin (COUMADIN) 10 MG tablet Take 1 tablet (10 mg total) by mouth daily at 6 PM. 10/19/13   Almyra Deforest, PA   Triage Vitals: BP 131/67  Pulse 70  Temp(Src) 97.9 F (36.6 C) (Oral)  Resp 18  Ht 6' (1.829 m)  Wt 233 lb 6 oz (105.858 kg)  BMI 31.64 kg/m2  SpO2 92%  Physical Exam  Constitutional: He is oriented to person, place, and time. He appears well-developed and well-nourished. No distress.  HENT:  Head: Normocephalic and atraumatic.  Right Ear: Hearing normal.  Left Ear: Hearing normal.  Nose: Nose normal.  Mouth/Throat: Oropharynx is clear and moist and mucous membranes are normal.  Eyes: Conjunctivae and EOM are normal. Pupils are equal, round, and reactive to light.  Neck: Normal range of motion. Neck supple.  Cardiovascular: Regular rhythm, S1 normal and S2 normal.  Exam reveals no gallop and no friction rub.   No murmur  heard. Pulmonary/Chest: Effort normal and breath sounds normal. No respiratory distress. He exhibits no tenderness.  Abdominal: Soft. Normal appearance and bowel sounds are normal. There is no hepatosplenomegaly. There is no tenderness. There is no rebound, no guarding, no tenderness at McBurney's point and negative Murphy's sign. No hernia.  Musculoskeletal: Normal range of motion.  Neurological: He is alert and oriented to person, place, and time. He has normal strength. No cranial nerve deficit or sensory deficit. Coordination normal. GCS eye subscore is 4. GCS verbal subscore is 5. GCS motor subscore is 6.  Skin: Skin is warm, dry and intact. No rash noted. No cyanosis.  Psychiatric: He has a normal mood and affect. His speech is normal and behavior is normal. Thought content normal.    ED Course  Procedures (including critical care time) DIAGNOSTIC STUDIES: Oxygen Saturation is 92% on RA, low by my interpretation.    COORDINATION OF CARE: 4:09 PM-Discussed treatment plan which includes change in antibiotics with pt at bedside and pt agreed to plan.     Labs Review Labs Reviewed  URINALYSIS, ROUTINE W REFLEX MICROSCOPIC  PROTIME-INR    Imaging Review No results found.   EKG Interpretation None      MDM   Final diagnoses:  None   Patient with recent urinary tract infection presents with hematuria and urinary symptoms. Urinalysis shows obvious signs of infection. Patient is not septic in appearance. Vital signs are stable. He is experiencing mild hematuria secondary to anticoagulation, INR is essentially therapeutic. Reviewing the most recent culture shows that the patient is infected with Escherichia coli that was resistant to Bactrim which is the region that was chosen most recently. Patient was given IM Rocephin and will be treated with Keflex.   I personally performed the services described in this documentation, which was scribed in my presence. The recorded  information has been reviewed and is accurate.  Walter Greek, MD 11/14/13 [phone]

## 2013-11-14 NOTE — ED Notes (Signed)
Patient was admitted earlier this month and got an e-coli bladder infection that travelled to his prostate. Pt is on his 4th round of antibiotics and is now urinating blood. Pt states he was told that the abx (bactrim) can interfere with coumadin, but it was he needed it to prevent sepsis. Pt sent here for a check of his coumadin level.

## 2013-11-14 NOTE — ED Notes (Addendum)
Per Dr. Betsey Holiday -- begin Keflex tomorrow and hold Coumadin for tonight only. Pt/caregiver verbalized understanding. Connye Burkitt, RN

## 2013-11-16 LAB — URINE CULTURE: Colony Count: 80000

## 2013-11-18 ENCOUNTER — Other Ambulatory Visit: Payer: Self-pay

## 2013-11-18 ENCOUNTER — Ambulatory Visit (INDEPENDENT_AMBULATORY_CARE_PROVIDER_SITE_OTHER): Payer: Medicare Other | Admitting: *Deleted

## 2013-11-18 DIAGNOSIS — Z7901 Long term (current) use of anticoagulants: Secondary | ICD-10-CM | POA: Diagnosis not present

## 2013-11-18 DIAGNOSIS — I4891 Unspecified atrial fibrillation: Secondary | ICD-10-CM | POA: Diagnosis not present

## 2013-11-18 LAB — POCT INR: INR: 2

## 2013-11-18 MED ORDER — DIGOXIN 125 MCG PO TABS: 0.1250 mg | ORAL_TABLET | Freq: Every day | ORAL | Status: DC

## 2013-11-25 ENCOUNTER — Ambulatory Visit (INDEPENDENT_AMBULATORY_CARE_PROVIDER_SITE_OTHER): Payer: Medicare Other | Admitting: Nurse Practitioner

## 2013-11-25 ENCOUNTER — Ambulatory Visit (INDEPENDENT_AMBULATORY_CARE_PROVIDER_SITE_OTHER): Payer: Medicare Other | Admitting: Pharmacist

## 2013-11-25 ENCOUNTER — Encounter: Payer: Self-pay | Admitting: Nurse Practitioner

## 2013-11-25 VITALS — BP 110/70 | HR 68 | Ht 72.0 in | Wt 235.8 lb

## 2013-11-25 DIAGNOSIS — I2510 Atherosclerotic heart disease of native coronary artery without angina pectoris: Secondary | ICD-10-CM | POA: Diagnosis not present

## 2013-11-25 DIAGNOSIS — I428 Other cardiomyopathies: Secondary | ICD-10-CM | POA: Diagnosis not present

## 2013-11-25 DIAGNOSIS — I4891 Unspecified atrial fibrillation: Secondary | ICD-10-CM | POA: Diagnosis not present

## 2013-11-25 DIAGNOSIS — Z7901 Long term (current) use of anticoagulants: Secondary | ICD-10-CM

## 2013-11-25 DIAGNOSIS — Z79899 Other long term (current) drug therapy: Secondary | ICD-10-CM

## 2013-11-25 DIAGNOSIS — I480 Paroxysmal atrial fibrillation: Secondary | ICD-10-CM

## 2013-11-25 LAB — HEPATIC FUNCTION PANEL
ALT: 29 U/L (ref 0–53)
AST: 23 U/L (ref 0–37)
Albumin: 4 g/dL (ref 3.5–5.2)
Alkaline Phosphatase: 52 U/L (ref 39–117)
Bilirubin, Direct: 0.1 mg/dL (ref 0.0–0.3)
Total Bilirubin: 0.7 mg/dL (ref 0.2–1.2)
Total Protein: 6.9 g/dL (ref 6.0–8.3)

## 2013-11-25 LAB — BASIC METABOLIC PANEL
BUN: 35 mg/dL — ABNORMAL HIGH (ref 6–23)
CO2: 26 mEq/L (ref 19–32)
Calcium: 8.9 mg/dL (ref 8.4–10.5)
Chloride: 100 mEq/L (ref 96–112)
Creatinine, Ser: 1.1 mg/dL (ref 0.4–1.5)
GFR: 68.21 mL/min (ref >60.00)
Glucose, Bld: 116 mg/dL — ABNORMAL HIGH (ref 70–99)
Potassium: 4.1 mEq/L (ref 3.5–5.1)
Sodium: 136 mEq/L (ref 135–145)

## 2013-11-25 LAB — CBC
HCT: 49.1 % (ref 39.0–52.0)
Hemoglobin: 16.4 g/dL (ref 13.0–17.0)
MCHC: 33.4 g/dL (ref 30.0–36.0)
MCV: 93.2 fl (ref 78.0–100.0)
Platelets: 233 10*3/uL (ref 150.0–400.0)
RBC: 5.27 Mil/uL (ref 4.22–5.81)
RDW: 14.4 % (ref 11.5–15.5)
WBC: 7.9 10*3/uL (ref 4.0–10.5)

## 2013-11-25 LAB — POCT INR: INR: 2.9

## 2013-11-25 LAB — TSH: TSH: 1.36 u[IU]/mL (ref 0.35–4.50)

## 2013-11-25 MED ORDER — AMIODARONE HCL 200 MG PO TABS: 200.0000 mg | ORAL_TABLET | Freq: Every day | ORAL | Status: DC

## 2013-11-25 MED ORDER — HYDROCHLOROTHIAZIDE 25 MG PO TABS: 25.0000 mg | ORAL_TABLET | Freq: Every day | ORAL | Status: DC

## 2013-11-25 MED ORDER — WARFARIN SODIUM 10 MG PO TABS: 10.0000 mg | ORAL_TABLET | Freq: Every day | ORAL | Status: DC

## 2013-11-25 MED ORDER — DIGOXIN 125 MCG PO TABS: 0.1250 mg | ORAL_TABLET | Freq: Every day | ORAL | Status: DC

## 2013-11-25 MED ORDER — METOPROLOL SUCCINATE ER 25 MG PO TB24: 25.0000 mg | ORAL_TABLET | Freq: Every day | ORAL | Status: DC

## 2013-11-25 MED ORDER — LISINOPRIL 10 MG PO TABS: 10.0000 mg | ORAL_TABLET | Freq: Every day | ORAL | Status: DC

## 2013-11-25 MED ORDER — FUROSEMIDE 40 MG PO TABS: 40.0000 mg | ORAL_TABLET | Freq: Every day | ORAL | Status: DC | PRN

## 2013-11-25 NOTE — Addendum Note (Signed)
Addended by: Burtis Junes on: 11/25/2013 09:10 AM   Modules accepted: Orders

## 2013-11-25 NOTE — Progress Notes (Addendum)
Walter [name] Date of Birth: 07/16/1945 Medical Record #[id number]  History of Present Illness: Walter [name] is seen back today for a post hospital visit. Seen for Dr. Aundra Dubin. He has AF, HTN, RLS, nonocclusive CAD, chronic LBBB, factor V leiden deficiency. DM, HLD and NICM with EF of 20% per echo from July of 2015.  Most recently in the hospital with progressive shortness of breath with AF with RVR with ventricular rates 180's- failed TEE with cardioversion x 2 due to inability to pass U/S probe, EGD negative for stricture - then had intracardiac U/S and cardioversion. Placed on amiodarone and is on coumadin. Will need echo in 3 months to reassess for need for ICD implant.   Myoview demonstrated decreased EF and he underwent catheterization which demonstrated non ischemic cardiomyopathy with no significant coronary disease. His rates were difficult to control and he was placed on IV Amiodarone. There was some concern for his cardiomyopathy being tachycardia mediated.   Has had recent issues with hematuria/UTI. Has been on Bactrim and now on Keflex.  Comes in today. Here alone. He is doing well. Feeling "back to his normal self". No chest pain. Breathing is good. Lots of questions about his medicines. Some transient weight gain - probably due to salt. UTI has improved - remains on Keflex. No palpitations.  Has had no medicine yet today.    Current Outpatient Prescriptions  Medication Sig Dispense Refill  . albuterol (PROVENTIL HFA;VENTOLIN HFA) 108 (90 BASE) MCG/ACT inhaler Inhale 2 puffs into the lungs as needed for wheezing or shortness of breath.      Marland Kitchen amiodarone (PACERONE) 200 MG tablet Take 1 tablet (200 mg total) by mouth daily.  90 tablet  3  . cephALEXin (KEFLEX) 500 MG capsule Take 2 capsules (1,000 mg total) by mouth 2 (two) times daily.  56 capsule  0  . cholecalciferol (VITAMIN D) 1000 UNITS tablet Take 1,000 Units by mouth daily.      . digoxin (LANOXIN) 0.125 MG tablet Take 1 tablet  (0.125 mg total) by mouth daily.  90 tablet  3  . esomeprazole (NEXIUM) 40 MG capsule Take 40 mg by mouth daily at 12 noon.      . furosemide (LASIX) 40 MG tablet Take 1 tablet (40 mg total) by mouth daily as needed. Prn for weight gain, swelling, shortness of breath, etc.  90 tablet  3  . hydrochlorothiazide (HYDRODIURIL) 25 MG tablet Take 1 tablet (25 mg total) by mouth daily.  90 tablet  3  . HYDROcodone-acetaminophen (NORCO/VICODIN) 5-325 MG per tablet Take 2 tablets by mouth every 6 (six) hours as needed for moderate pain.      Marland Kitchen lisinopril (PRINIVIL,ZESTRIL) 10 MG tablet Take 1 tablet (10 mg total) by mouth daily.  90 tablet  3  . lovastatin (ALTOPREV) 60 MG 24 hr tablet Take 40 mg by mouth at bedtime.       . magnesium oxide (MAG-OX) 400 MG tablet Take 400 mg by mouth daily.      . metFORMIN (GLUCOPHAGE) 500 MG tablet Take 500 mg by mouth 2 (two) times daily with a meal.      . metoprolol succinate (TOPROL-XL) 25 MG 24 hr tablet Take 1 tablet (25 mg total) by mouth daily.  90 tablet  3  . omega-3 acid ethyl esters (LOVAZA) 1 G capsule Take 1 g by mouth 2 (two) times daily.      . potassium chloride SA (K-DUR,KLOR-CON) 20 MEQ tablet Take 2 tablets (40 mEq  total) by mouth daily.  60 tablet  3  . rOPINIRole (REQUIP) 0.5 MG tablet Take 0.5 mg by mouth daily as needed.      Marland Kitchen rOPINIRole (REQUIP) 1 MG tablet Take 1 mg by mouth at bedtime.      . sildenafil (VIAGRA) 100 MG tablet Take 100 mg by mouth daily as needed for erectile dysfunction.      Marland Kitchen testosterone cypionate (DEPO-TESTOSTERONE) 200 MG/ML injection Inject into the muscle every 7 (seven) days.      Marland Kitchen tiZANidine (ZANAFLEX) 4 MG capsule Take 4 mg by mouth as needed for muscle spasms.      Marland Kitchen warfarin (COUMADIN) 10 MG tablet Take 1 tablet (10 mg total) by mouth daily at 6 PM.  90 tablet  3   No current facility-administered medications for this visit.    Allergies  Allergen Reactions  . Statins     Myalgias.  Can only take Altoprev      PMH:  1. Type II diabetes  2. HTN  3. Adrenal nodule  4. Restless leg syndrome  5. OA with left hip pain, right frozen shoulder, low back pain.  6. Testicular cancer: 1972  7. Chronic LBBB: Had Cardiolite in 2011 that was abnormal so had LHC in 6/11 in [place], [place]. This showed 30% LCx stenosis and 40% ostial RCA stenosis.  8. Cardiomyopathy: Mild nonischemic cardiomyopathy, ?LBBB cardiomyopathy. Echo (5/11) with EF 45-50%, moderate LVH.  9. Factor V Leiden +  10. Asbestosis: Mild. Exposure while in the [employer].  11. GERD 12. PVCs  13. Polycythemia: Uncertain etiology  14. Hyperlipidemia: Myalgias with Zocor and Lipitor.       Past Medical History  Diagnosis Date  . Essential hypertension   . DJD (degenerative joint disease)     /osteoarthritis  . Frozen shoulder     right  . Chronic left hip pain   . Chronic low back pain     /sciatica- Dr Letta Median [facility])  . Asbestosis     mild  . GERD (gastroesophageal reflux disease)   . Hypogonadism male   . Coronary artery disease, non-occlusive June 2011    Cardiac cath in [facility] following abnormal Myoview  . Left bundle branch block (LBBB) on electrocardiogram     Diagnosed close to 20 years ago  . Obesity   . Factor V Leiden   . Testicular cancer 1972    left  . Type 2 diabetes mellitus without complication 04/8313    Type 2.   . Non-ischemic cardiomyopathy June 2011    EF 45-50%; suspect related to LBBB; repeat Echo March 2015: EF 50- 55%  . Dyslipidemia, goal LDL below 100     On lovastatin (myalgias with Zocor and Lipitor)  . Atrial fibrillation     Past Surgical History  Procedure Laterality Date  . Lymph nodes    . Tonsillectomy Bilateral   . Surgery scrotal / testicular Left   . Appendectomy N/A   . Rotator cuff repair Right   . Cholecystectomy open    . Hernia repair    . Knee arthroscopy Left   . Cardiac catheterization  June 2011    Nonobstructive CAD  . Transthoracic  echocardiogram  May 2011    EF 45% with diffuse hypokinesis; septal bounce from LBBB  . Transthoracic echocardiogram  March 2015    EF 50-55%. Mild concentric LVH. Septal dyssynergy from LBBB   . Cardioversion N/A 10/13/2013    Procedure: CARDIOVERSION;  Surgeon: Larey Dresser, MD;  Location: [place];  Service: Cardiovascular;  Laterality: N/A;  . Esophagogastroduodenoscopy N/A 10/15/2013    Procedure: ESOPHAGOGASTRODUODENOSCOPY (EGD);  Surgeon: Gatha Mayer, MD;  Location: [facility] ENDOSCOPY;  Service: Endoscopy;  Laterality: N/A;  bedside  . Cardioversion  10-16-2013    DCCV by [name] following intracardiac echo to rule out LAA thrombus    History  Smoking status  . Former Smoker  . Types: Cigarettes  Smokeless tobacco  . Not on file    History  Alcohol Use  . Yes    Comment: rare    Family History  Problem Relation Age of Onset  . Hypertension Mother   . Heart disease Mother   . Heart disease Father   . Hypertension Brother   . Heart attack Maternal Uncle   . Heart disease Paternal Uncle   . Hypertension Brother     Review of Systems: The review of systems is per the HPI.  All other systems were reviewed and are negative.  Physical Exam: BP 110/70  Pulse 68  Ht 6' (1.829 m)  Wt 235 lb 12.8 oz (106.958 kg)  BMI 31.97 kg/m2 Patient is very pleasant and in no acute distress. Skin is warm and dry. Color is normal.  HEENT is unremarkable. Normocephalic/atraumatic. PERRL. Sclera are nonicteric. Neck is supple. No masses. No JVD. Lungs are clear. Cardiac exam shows a regular rate and rhythm. Abdomen is soft. Extremities are without edema. Gait and ROM are intact. No gross neurologic deficits noted.  Wt Readings from Last 3 Encounters:  11/25/13 235 lb 12.8 oz (106.958 kg)  11/14/13 233 lb 6 oz (105.858 kg)  10/19/13 240 lb 6.4 oz (109.045 kg)    LABORATORY DATA/PROCEDURES:  EKG today shows sinus rhythm - rate of 68 - LBBB  Lab Results  Component Value Date   WBC 6.7  10/19/2013   HGB 16.7 10/19/2013   HCT 49.4 10/19/2013   PLT 179 10/19/2013   GLUCOSE 118* 10/18/2013   ALT 65* 10/09/2013   AST 34 10/09/2013   NA 136* 10/18/2013   K 4.0 10/18/2013   CL 98 10/18/2013   CREATININE 1.19 10/18/2013   BUN 22 10/18/2013   CO2 26 10/18/2013   TSH 1.420 10/09/2013   INR 2.9 11/25/2013   HGBA1C 6.6* 10/09/2013    Lab Results  Component Value Date   INR 2.9 11/25/2013   INR 2.0 11/18/2013   INR 3.29* 11/14/2013     BNP (last 3 results)  Recent Labs  10/09/13 1845  PROBNP 1484.0*   Echo Study Conclusions from August 2015  - Left ventricle: The cavity size was at the upper limits of normal. Wall thickness was increased in a pattern of mild LVH. Systolic function was severely reduced. The estimated ejection fraction was 20%. Diffuse hypokinesis. Probable akinesis of the basal-midinferolateral and inferior myocardium. The study is not technically sufficient to allow evaluation of LV diastolic function. - Ventricular septum: Septal motion showed abnormal function and dyssynergy. - Aortic valve: Mildly calcified annulus. Trileaflet. There was no significant regurgitation. - Mitral valve: Calcified annulus. There was trivial regurgitation. - Left atrium: The atrium was moderately dilated. - Right ventricle: Systolic function was mildly to moderately reduced. - Right atrium: The atrium was moderately dilated. Central venous pressure (est): 15 mm Hg. - Tricuspid valve: There was trivial regurgitation. - Pulmonary arteries: PA peak pressure: 35 mm Hg (S). - Pericardium, extracardiac: There was no pericardial effusion.  Impressions:  - Upper  normal LV chamber size with mild LVH and LVEF approximately 20%. Relatively diffuse hypokinesis, although most prominent in the mid to basal inferolateral wall. Indeterminate diastolic function. Moderate biatrial enlargement. Mild to moderate reduction in RV contraction. Trivial tricuspid regurgitation with PASP 35 mm  mercury.   ANGIOGRAPHY:  There is evidence on fluoroscopy. Coronary calcification.  Left main: Mild coronary calcification without stenosis and bifurcated into the LAD and left circumflex coronary artery.  LAD: Mild calcification. The vessel is free of significant obstructive disease and gave rise to a small first diagonal and large proximal second diagonal vessel. LAD wrapped around it.  Left circumflex: Mild smooth 20% proximal narrowing prior to giving rise to a moderate-sized first marginal branch. The remainder of the vessel was normal  Right coronary artery: Angiographically normal vessel  Left ventriculography not performed due to due significant heart rate irritability and prior documentation of severe LV dysfunction.  Total contrast used: 50 cc  IMPRESSION:  Severe nonischemic cardiomyopathy  Mild coronary calcification without significant coronary obstructive disease with mild smooth 20% proximal circumflex stenosis.  Atrial fibrillation with rapid ventricular response  Transient profound bradycardia /near asystole requiring atropine.  RECOMMENDATION:  Medical therapy for his severe cardiomyopathy. The patient is now on IV amiodarone with improvement. Ventricular rate control Cardizem. Anticoagulation will be instituted post catheterization with medical therapy titration. He did benefit from ultimate show sinus rhythm. If left jugular function does not improve, life-vest/potential future ICD therapy may be necessary.  Troy Sine, MD, [facility]  10/12/2013  12:03 PM       Assessment / Plan: 1. PAF - s/p intracardiac U/S with cardioversion to NSR - on amiodarone - EKG today shows sinus. Amiodarone is cut back to 200 mg each day.   2. Chronic coumadin therapy - will need coumadin rechecked in one week since cutting back the amiodarone. Aspirin stopped today.  3. Hematuria/UTI - resolved.  4. Nonobstructive CAD per recent cath -  No active symptoms  5. NICM - will need repeat  echo in 3 months. Check follow up labs today. Discussed need for salt restriction. Changed Lasix to just "prn". Hopefully his EF will improve with restoration of sinus rhythm. Echo in 3 months.   Patient is agreeable to this plan and will call if any problems develop in the interim.   Burtis Junes, RN, [place] [address] [place], [place]  [id number] [phone]

## 2013-11-25 NOTE — Patient Instructions (Addendum)
Stay on your current medicines but I am:    Cutting the amiodarone to just one pill a day   Cutting the Lasix back to just "as needed" - for swelling, weight gain, etc.   Stop your aspirin  Weigh yourself each morning and record.  Take your dose of diuretic for weight gain of 3 pounds in 24 hours.   Limit sodium intake. Goal is to have less than 2000 mg (2gm) of salt per day.  We will check labs today  Needs coumadin check in one week  See Dr. Aundra Dubin later this month as planned  Call the [place] office at [phone] if you have any questions, problems or concerns.

## 2013-11-26 LAB — DIGOXIN LEVEL: Digoxin Level: 0.9 ng/mL (ref 0.8–2.0)

## 2013-11-27 DIAGNOSIS — R82998 Other abnormal findings in urine: Secondary | ICD-10-CM | POA: Diagnosis not present

## 2013-11-27 DIAGNOSIS — R300 Dysuria: Secondary | ICD-10-CM | POA: Diagnosis not present

## 2013-12-02 ENCOUNTER — Ambulatory Visit (INDEPENDENT_AMBULATORY_CARE_PROVIDER_SITE_OTHER): Payer: Medicare Other | Admitting: Pharmacist

## 2013-12-02 DIAGNOSIS — Z7901 Long term (current) use of anticoagulants: Secondary | ICD-10-CM

## 2013-12-02 DIAGNOSIS — I4891 Unspecified atrial fibrillation: Secondary | ICD-10-CM | POA: Diagnosis not present

## 2013-12-02 LAB — POCT INR: INR: 3.9

## 2013-12-05 IMAGING — US US AORTA SCREENING (MEDICARE)
1 series · 6 of 6 positions shown · non-contrast
Comparison: None.

CLINICAL DATA: Screening for abdominal aortic aneurysm

EXAM:
ABDOMINAL AORTA SCREENING ULTRASOUND
TECHNIQUE: Ultrasound examination of the abdominal aorta was performed as a
screening evaluation for abdominal aortic aneurysm.

[Series 1: us aorta screening (medicare) · 0.39mm/px · 6 of 6 slices shown]
[im 1/6]
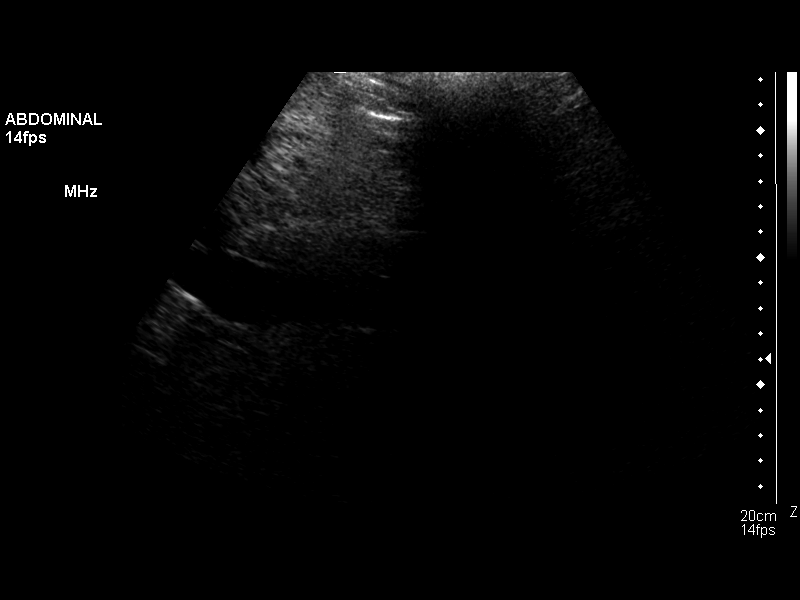
[im 2/6]
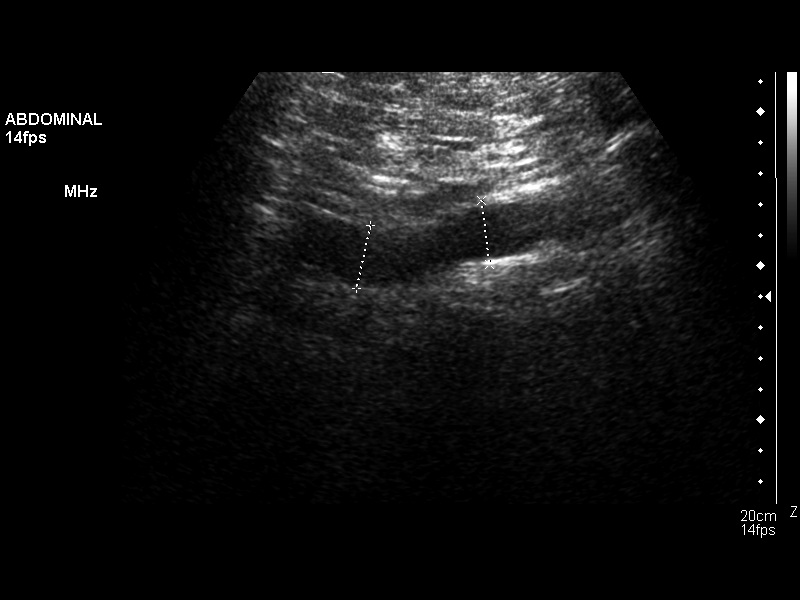
[im 3/6]
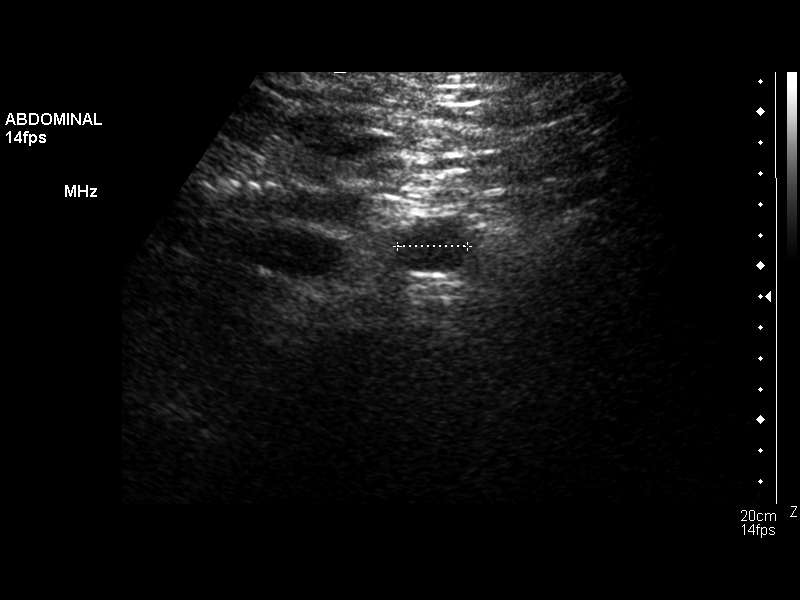
[im 4/6]
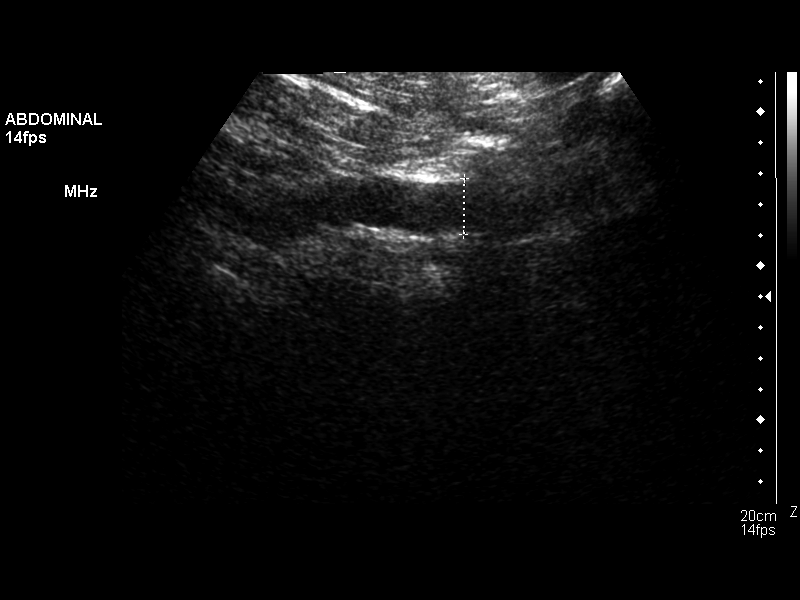
[im 5/6]
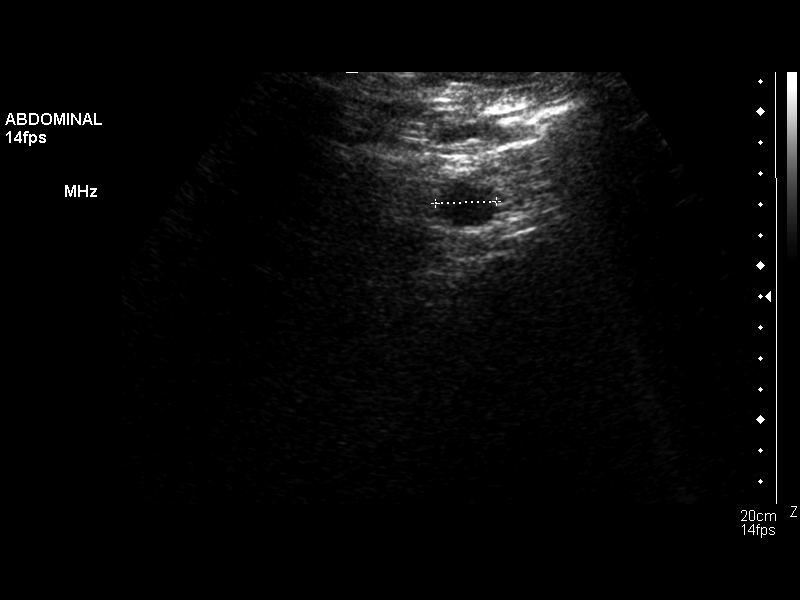
[im 6/6]
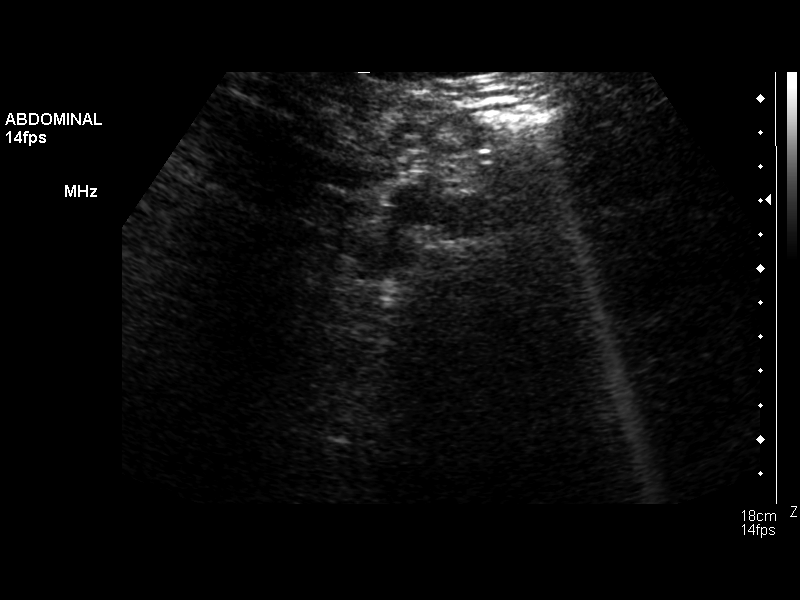

[6 of 6 positions shown; findings below may reference images not displayed]

FINDINGS: Abdominal Aorta: No abdominal aortic aneurysm is seen. The maximum
diameter of the abdominal aorta is proximally where it measures
x 2.1 cm. The most proximal abdominal aorta is obscured by bowel
gas.

Maximum AP diameter:  2.1 cm

Maximum TRV diameter:  2.3 cm
IMPRESSION: No abdominal aortic aneurysm is seen.

## 2013-12-11 ENCOUNTER — Encounter: Payer: Self-pay | Admitting: *Deleted

## 2013-12-11 ENCOUNTER — Encounter: Payer: Self-pay | Admitting: Cardiology

## 2013-12-11 ENCOUNTER — Ambulatory Visit (INDEPENDENT_AMBULATORY_CARE_PROVIDER_SITE_OTHER): Payer: Medicare Other

## 2013-12-11 ENCOUNTER — Ambulatory Visit (INDEPENDENT_AMBULATORY_CARE_PROVIDER_SITE_OTHER): Payer: Medicare Other | Admitting: Cardiology

## 2013-12-11 VITALS — BP 122/70 | HR 57 | Ht 72.0 in | Wt 236.0 lb

## 2013-12-11 DIAGNOSIS — R0602 Shortness of breath: Secondary | ICD-10-CM | POA: Diagnosis not present

## 2013-12-11 DIAGNOSIS — I447 Left bundle-branch block, unspecified: Secondary | ICD-10-CM | POA: Diagnosis not present

## 2013-12-11 DIAGNOSIS — R002 Palpitations: Secondary | ICD-10-CM

## 2013-12-11 DIAGNOSIS — I2510 Atherosclerotic heart disease of native coronary artery without angina pectoris: Secondary | ICD-10-CM

## 2013-12-11 DIAGNOSIS — I428 Other cardiomyopathies: Secondary | ICD-10-CM | POA: Diagnosis not present

## 2013-12-11 DIAGNOSIS — Z7901 Long term (current) use of anticoagulants: Secondary | ICD-10-CM

## 2013-12-11 DIAGNOSIS — E785 Hyperlipidemia, unspecified: Secondary | ICD-10-CM

## 2013-12-11 DIAGNOSIS — I4891 Unspecified atrial fibrillation: Secondary | ICD-10-CM

## 2013-12-11 DIAGNOSIS — I480 Paroxysmal atrial fibrillation: Secondary | ICD-10-CM

## 2013-12-11 DIAGNOSIS — Z8679 Personal history of other diseases of the circulatory system: Secondary | ICD-10-CM

## 2013-12-11 LAB — POCT INR: INR: 2.7

## 2013-12-11 MED ORDER — APIXABAN 5 MG PO TABS: 5.0000 mg | ORAL_TABLET | Freq: Two times a day (BID) | ORAL | Status: DC

## 2013-12-11 MED ORDER — SPIRONOLACTONE 25 MG PO TABS: ORAL_TABLET | ORAL | Status: DC

## 2013-12-11 NOTE — Patient Instructions (Addendum)
Stop HCTZ(hydrochlorothiazide).  Stop KCL(potassium)  Start spironolactone 12.5mg  daily. This will be 1/2 of a 25mg  tablet daily.  Your physician recommends that you return for a FASTING lipid profile /BMET/BNP/CBCd in about 10 days.   Your physician has requested that you have an echocardiogram. Echocardiography is a painless test that uses sound waves to create images of your heart. It provides your doctor with information about the size and shape of your heart and how well your heart's chambers and valves are working. This procedure takes approximately one hour. There are no restrictions for this procedure. October 2015  Stop coumadin and start Eliquis 5mg  two times a day when your INR is less than 2.  Your physician recommends that you schedule a follow-up appointment in: 2 months with Dr Aundra Dubin in the [facility].

## 2013-12-13 NOTE — Progress Notes (Signed)
Patient ID: Walter [name], male   DOB: [birth year], 68 y.o.   MRN: [id number] PCP: Dr. Orland Mustard  68 yo with history of paroxysmal atrial fibrillation, chronic LBBB and cardiomyopathy presents for cardiology evaluation.  He was found to have LBBB 15-20 years ago.  In 2011, he had a stress test in [facility] that was abnormal so he was taken for [facility] in 6/11 that showed nonobstructive disease.  He had an echo in 5/11 that showed EF 45-50%, diffuse hypokinesis suggesting a mild nonischemic cardiomyopathy.   He has admitted in 7/15 with atrial fibrillation with RVR.  Rate was very difficult to control.  TEE failed due to inability to pass probe x 2.  Eventually, ICE was done to rule out LAA thrombus and he was cardioverted to NSR.  He is on amiodarone now and in NSR.  Echo in 7/15 showed EF 20% with diffuse hypokinesis in setting of atrial fibrillation/RVR.  LHC was also done in 7/15 with minimal coronary disease.    He is on coumadin now with no BRBPR or melena.  He feels good, no exertional dyspnea.  He can walk up steps without problems.  No chest pain.  He is taking Lasix about once weekly.   ECG: NSR, LBBB  Labs (11/14): K 3.9, creatinine 1.14, LDL 104, LFTs normal Labs (2/15): HCT 56.3 Labs (9/15): K 4.1, creatinine 1.1, digoxin 0.9, TSH normal, LFTs normal  PMH: 1. Type II diabetes 2. HTN 3. Adrenal nodule 4. Restless leg syndrome 5. OA with left hip pain, right frozen shoulder, low back pain.  6. Testicular cancer: 1972 7. Chronic LBBB: Had Cardiolite in 2011 that was abnormal so had LHC in 6/11 in [place], [place].  This showed 30% LCx stenosis and 40% ostial RCA stenosis.  LHC in 7/15 in [place] showed minimal coronary disease.  8. 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  Echo (5/11) with EF 45-50%, moderate LVH.  Echo (7/15) with EF 20%, diffuse hypokinesis, mild to moderately decreased RV systolic function (in setting of  afib with RVR). LHC (7/15) with minimal coronary disease.  9. Factor V Leiden + 10. Asbestosis: Mild.  Exposure while in the [employer].  11. GERD 12. PVCs 13. Polycythemia: Uncertain etiology 14. Hyperlipidemia: Myalgias with Zocor and Lipitor.  15. Atrial fibrillation: Paroxysmal.  Unable to pass TEE probe.   SH: Retired from [employer], lives with wife in [place].  2 daughters.  Occasional ETOH.  Quit smoking > 40 years ago.   FH: No premature CAD  ROS: All systems reviewed and negative except as per HPI.    Current Outpatient Prescriptions  Medication Sig Dispense Refill  . albuterol (PROVENTIL HFA;VENTOLIN HFA) 108 (90 BASE) MCG/ACT inhaler Inhale 2 puffs into the lungs as needed for wheezing or shortness of breath.      Marland Kitchen amiodarone (PACERONE) 200 MG tablet Take 1 tablet (200 mg total) by mouth daily.  90 tablet  3  . cholecalciferol (VITAMIN D) 1000 UNITS tablet Take 1,000 Units by mouth daily.      . digoxin (LANOXIN) 0.125 MG tablet Take 1 tablet (0.125 mg total) by mouth daily.  90 tablet  3  . esomeprazole (NEXIUM) 40 MG capsule Take 40 mg by mouth daily at 12 noon.      . furosemide (LASIX) 40 MG tablet Take 1 tablet (40 mg total) by mouth daily as needed. Prn for weight gain, swelling, shortness of breath, etc.  90 tablet  3  .  HYDROcodone-acetaminophen (NORCO/VICODIN) 5-325 MG per tablet Take 2 tablets by mouth every 6 (six) hours as needed for moderate pain.      Marland Kitchen lisinopril (PRINIVIL,ZESTRIL) 10 MG tablet Take 1 tablet (10 mg total) by mouth daily.  90 tablet  3  . lovastatin (ALTOPREV) 60 MG 24 hr tablet Take 40 mg by mouth at bedtime.       . magnesium oxide (MAG-OX) 400 MG tablet Take 400 mg by mouth daily.      . metFORMIN (GLUCOPHAGE) 500 MG tablet Take 500 mg by mouth 2 (two) times daily with a meal.      . metoprolol succinate (TOPROL-XL) 25 MG 24 hr tablet Take 1 tablet (25 mg total) by mouth daily.  90 tablet  3  . omega-3 acid ethyl esters (LOVAZA) 1 G  capsule Take 1 g by mouth 2 (two) times daily.      Marland Kitchen rOPINIRole (REQUIP) 0.5 MG tablet Take 0.5 mg by mouth daily as needed.      Marland Kitchen rOPINIRole (REQUIP) 1 MG tablet Take 1 mg by mouth at bedtime.      . sildenafil (VIAGRA) 100 MG tablet Take 100 mg by mouth daily as needed for erectile dysfunction.      Marland Kitchen testosterone cypionate (DEPO-TESTOSTERONE) 200 MG/ML injection Inject into the muscle every 7 (seven) days.      Marland Kitchen tiZANidine (ZANAFLEX) 4 MG capsule Take 4 mg by mouth as needed for muscle spasms.      Marland Kitchen apixaban (ELIQUIS) 5 MG TABS tablet Take 1 tablet (5 mg total) by mouth 2 (two) times daily.  60 tablet  2  . spironolactone (ALDACTONE) 25 MG tablet 1/2 tablet (12.5mg ) daily  15 tablet  2   No current facility-administered medications for this visit.   BP 122/70  Pulse 57  Ht 6' (1.829 m)  Wt 236 lb (107.049 kg)  BMI 32.00 kg/m2 General: NAD, overweight Neck: No JVD, no thyromegaly or thyroid nodule.  Lungs: Clear to auscultation bilaterally with normal respiratory effort. CV: Nondisplaced PMI.  Heart regular S1/S2, no S3/S4, no murmur.  No peripheral edema.  No carotid bruit.  Normal pedal pulses.  Abdomen: Soft, nontender, no hepatosplenomegaly, no distention.  Skin: Intact without lesions or rashes.  Neurologic: Alert and oriented x 3.  Psych: Normal affect. Extremities: No clubbing or cyanosis.    Assessment/Plan: 1. Cardiomyopathy: Nonischemic.  There was a pre-existing possible LBBB cardiomyopathy, but EF was down to 20% in the setting of atrial fibrillation with RVR in 7/15 (suggesting tachycardia-mediated cardiomyopathy).  No significant coronary disease on 7/15 LHC.   NYHA class I-II symptoms without volume overload.  - Continue current digoxin (level ok recently), Toprol XL, and lisinopril.  I will have him stop HCTZ and start spironolactone 12.5 mg daily with BMET/BNP in 10 days.  He may stop KCl.  - Repeat echo in 10/15.  If EF remains depressed, will need to consider  CRT-D.  - May continue to use Lasix prn.  2. CAD: Nonobstructive, mild.  Continue statin.  No aspirin as he is anticoagulated.  3. Hyperlipidemia: Continue lovastatin.  Myalgias with other statins.  4. HTN: BP is controlled on current regimen.  5. Atrial fibrillation: Paroxysmal, now in NSR on amiodarone.  Needs to maintain NSR as possible tachy-mediated cardiomyopathy.   - May stop warfarin when INR < 2 and start Eliquis 5 mg bid.  - Recent LFTs and TSH on amiodarone were normal.  Will need yearly eye exam.  Followup in 2 months in CHF clinic.   Loralie Champagne 12/13/2013

## 2013-12-21 ENCOUNTER — Other Ambulatory Visit (INDEPENDENT_AMBULATORY_CARE_PROVIDER_SITE_OTHER): Payer: Medicare Other | Admitting: *Deleted

## 2013-12-21 ENCOUNTER — Ambulatory Visit (HOSPITAL_COMMUNITY): Payer: Medicare Other | Attending: Cardiology | Admitting: Cardiology

## 2013-12-21 DIAGNOSIS — I428 Other cardiomyopathies: Secondary | ICD-10-CM

## 2013-12-21 DIAGNOSIS — I2510 Atherosclerotic heart disease of native coronary artery without angina pectoris: Secondary | ICD-10-CM | POA: Diagnosis not present

## 2013-12-21 DIAGNOSIS — R0600 Dyspnea, unspecified: Secondary | ICD-10-CM | POA: Insufficient documentation

## 2013-12-21 DIAGNOSIS — E785 Hyperlipidemia, unspecified: Secondary | ICD-10-CM

## 2013-12-21 DIAGNOSIS — Z8679 Personal history of other diseases of the circulatory system: Secondary | ICD-10-CM

## 2013-12-21 DIAGNOSIS — R9430 Abnormal result of cardiovascular function study, unspecified: Secondary | ICD-10-CM | POA: Diagnosis not present

## 2013-12-21 DIAGNOSIS — Z7901 Long term (current) use of anticoagulants: Secondary | ICD-10-CM | POA: Diagnosis not present

## 2013-12-21 DIAGNOSIS — I447 Left bundle-branch block, unspecified: Secondary | ICD-10-CM | POA: Insufficient documentation

## 2013-12-21 DIAGNOSIS — R0602 Shortness of breath: Secondary | ICD-10-CM | POA: Insufficient documentation

## 2013-12-21 DIAGNOSIS — I10 Essential (primary) hypertension: Secondary | ICD-10-CM | POA: Diagnosis not present

## 2013-12-21 DIAGNOSIS — R002 Palpitations: Secondary | ICD-10-CM

## 2013-12-21 DIAGNOSIS — I480 Paroxysmal atrial fibrillation: Secondary | ICD-10-CM

## 2013-12-21 DIAGNOSIS — I429 Cardiomyopathy, unspecified: Secondary | ICD-10-CM

## 2013-12-21 LAB — CBC WITH DIFFERENTIAL/PLATELET
BASOS ABS: 0 10*3/uL (ref 0.0–0.1)
BASOS PCT: 0.3 % (ref 0.0–3.0)
Eosinophils Absolute: 0.2 10*3/uL (ref 0.0–0.7)
Eosinophils Relative: 3.2 % (ref 0.0–5.0)
HCT: 46.6 % (ref 39.0–52.0)
HEMOGLOBIN: 15.5 g/dL (ref 13.0–17.0)
LYMPHS PCT: 26.1 % (ref 12.0–46.0)
Lymphs Abs: 1.6 10*3/uL (ref 0.7–4.0)
MCHC: 33.3 g/dL (ref 30.0–36.0)
MCV: 94.4 fl (ref 78.0–100.0)
MONOS PCT: 7.5 % (ref 3.0–12.0)
Monocytes Absolute: 0.5 10*3/uL (ref 0.1–1.0)
NEUTROS ABS: 3.8 10*3/uL (ref 1.4–7.7)
NEUTROS PCT: 62.9 % (ref 43.0–77.0)
Platelets: 217 10*3/uL (ref 150.0–400.0)
RBC: 4.94 Mil/uL (ref 4.22–5.81)
RDW: 14.7 % (ref 11.5–15.5)
WBC: 6.1 10*3/uL (ref 4.0–10.5)

## 2013-12-21 LAB — LIPID PANEL
CHOL/HDL RATIO: 5
Cholesterol: 164 mg/dL (ref 0–200)
HDL: 35.3 mg/dL — ABNORMAL LOW (ref >39.00)
NonHDL: 128.7
Triglycerides: 262 mg/dL — ABNORMAL HIGH (ref 0.0–149.0)
VLDL: 52.4 mg/dL — AB (ref 0.0–40.0)

## 2013-12-21 LAB — LDL CHOLESTEROL, DIRECT: Direct LDL: 81.7 mg/dL

## 2013-12-21 LAB — BASIC METABOLIC PANEL
BUN: 22 mg/dL (ref 6–23)
CO2: 28 mEq/L (ref 19–32)
Calcium: 8.8 mg/dL (ref 8.4–10.5)
Chloride: 99 mEq/L (ref 96–112)
Creatinine, Ser: 1.1 mg/dL (ref 0.4–1.5)
GFR: 73.42 mL/min (ref >60.00)
Glucose, Bld: 101 mg/dL — ABNORMAL HIGH (ref 70–99)
Potassium: 4 mEq/L (ref 3.5–5.1)
SODIUM: 136 meq/L (ref 135–145)

## 2013-12-21 LAB — BRAIN NATRIURETIC PEPTIDE: PRO B NATRI PEPTIDE: 68 pg/mL (ref 0.0–100.0)

## 2013-12-21 NOTE — Progress Notes (Signed)
Limited echo performed. 

## 2013-12-23 DIAGNOSIS — M2550 Pain in unspecified joint: Secondary | ICD-10-CM | POA: Diagnosis not present

## 2013-12-23 DIAGNOSIS — Z79891 Long term (current) use of opiate analgesic: Secondary | ICD-10-CM | POA: Diagnosis not present

## 2013-12-23 DIAGNOSIS — M5137 Other intervertebral disc degeneration, lumbosacral region: Secondary | ICD-10-CM | POA: Diagnosis not present

## 2013-12-23 DIAGNOSIS — G894 Chronic pain syndrome: Secondary | ICD-10-CM | POA: Diagnosis not present

## 2013-12-23 DIAGNOSIS — Z79899 Other long term (current) drug therapy: Secondary | ICD-10-CM | POA: Diagnosis not present

## 2013-12-24 ENCOUNTER — Telehealth: Payer: Self-pay | Admitting: *Deleted

## 2013-12-24 NOTE — Telephone Encounter (Signed)
Notes Recorded by Larey Dresser, MD on 12/23/2013  EF remains low. Refer to EP for CRT-D.  Discussed with patient.  Pt requests referral to Dr Allred--[name] did his cardioversion July 2015.

## 2013-12-25 ENCOUNTER — Ambulatory Visit (INDEPENDENT_AMBULATORY_CARE_PROVIDER_SITE_OTHER): Payer: Medicare Other | Admitting: *Deleted

## 2013-12-25 DIAGNOSIS — I4891 Unspecified atrial fibrillation: Secondary | ICD-10-CM

## 2013-12-25 DIAGNOSIS — Z7901 Long term (current) use of anticoagulants: Secondary | ICD-10-CM

## 2013-12-25 LAB — POCT INR: INR: 1.4

## 2013-12-25 NOTE — Patient Instructions (Signed)
Pt was started on Eliquis 5mg  bid  for Atrial Fib on October 9.2015.    Reviewed patients medication list.  Pt is not  currently on any combined P-gp and strong CYP3A4 inhibitors/inducers (ketoconazole, traconazole, ritonavir, carbamazepine, phenytoin, rifampin, St. John's wort).  Reviewed labs.  SCr 1.1, Weight 108.1kg.  Dose is  appropriate based on labs and age .   Hgb 15.5 and HCT 46.6  A full discussion of the nature of anticoagulants has been carried out.  A benefit/risk analysis has been presented to the patient, so that they understand the justification for choosing anticoagulation with Eliquis at this time.  The need for compliance is stressed.  Pt is aware to take the medication twice daily.  Side effects of potential bleeding are discussed, including unusual colored urine or stools, coughing up blood or coffee ground emesis, nose bleeds or serious fall or head trauma.  Discussed signs and symptoms of stroke. The patient should avoid any OTC items containing aspirin or ibuprofen.  Avoid alcohol consumption.   Call if any signs of abnormal bleeding.  Discussed financial obligations and pt states will have no problems in obtaining medication.  Next lab test test in 1 month.

## 2014-01-08 DIAGNOSIS — N390 Urinary tract infection, site not specified: Secondary | ICD-10-CM | POA: Diagnosis not present

## 2014-01-08 DIAGNOSIS — E291 Testicular hypofunction: Secondary | ICD-10-CM | POA: Diagnosis not present

## 2014-01-13 DIAGNOSIS — M19011 Primary osteoarthritis, right shoulder: Secondary | ICD-10-CM | POA: Diagnosis not present

## 2014-01-15 ENCOUNTER — Ambulatory Visit (INDEPENDENT_AMBULATORY_CARE_PROVIDER_SITE_OTHER): Payer: Medicare Other | Admitting: Internal Medicine

## 2014-01-15 ENCOUNTER — Ambulatory Visit (INDEPENDENT_AMBULATORY_CARE_PROVIDER_SITE_OTHER): Payer: Medicare Other | Admitting: *Deleted

## 2014-01-15 ENCOUNTER — Encounter: Payer: Self-pay | Admitting: Internal Medicine

## 2014-01-15 ENCOUNTER — Encounter: Payer: Self-pay | Admitting: *Deleted

## 2014-01-15 VITALS — BP 132/84 | HR 63 | Ht 72.0 in | Wt 234.0 lb

## 2014-01-15 DIAGNOSIS — I10 Essential (primary) hypertension: Secondary | ICD-10-CM | POA: Diagnosis not present

## 2014-01-15 DIAGNOSIS — I482 Chronic atrial fibrillation, unspecified: Secondary | ICD-10-CM

## 2014-01-15 DIAGNOSIS — I481 Persistent atrial fibrillation: Secondary | ICD-10-CM | POA: Diagnosis not present

## 2014-01-15 DIAGNOSIS — I4819 Other persistent atrial fibrillation: Secondary | ICD-10-CM

## 2014-01-15 DIAGNOSIS — I519 Heart disease, unspecified: Secondary | ICD-10-CM

## 2014-01-15 DIAGNOSIS — Z23 Encounter for immunization: Secondary | ICD-10-CM | POA: Diagnosis not present

## 2014-01-15 DIAGNOSIS — R0602 Shortness of breath: Secondary | ICD-10-CM | POA: Diagnosis not present

## 2014-01-15 DIAGNOSIS — I4891 Unspecified atrial fibrillation: Secondary | ICD-10-CM

## 2014-01-15 DIAGNOSIS — I429 Cardiomyopathy, unspecified: Secondary | ICD-10-CM

## 2014-01-15 DIAGNOSIS — I428 Other cardiomyopathies: Secondary | ICD-10-CM

## 2014-01-15 DIAGNOSIS — I480 Paroxysmal atrial fibrillation: Secondary | ICD-10-CM | POA: Diagnosis not present

## 2014-01-15 DIAGNOSIS — R002 Palpitations: Secondary | ICD-10-CM | POA: Diagnosis not present

## 2014-01-15 DIAGNOSIS — I447 Left bundle-branch block, unspecified: Secondary | ICD-10-CM

## 2014-01-15 DIAGNOSIS — Z7901 Long term (current) use of anticoagulants: Secondary | ICD-10-CM

## 2014-01-15 MED ORDER — APIXABAN 5 MG PO TABS: 5.0000 mg | ORAL_TABLET | Freq: Two times a day (BID) | ORAL | Status: DC

## 2014-01-15 MED ORDER — SPIRONOLACTONE 25 MG PO TABS: 12.5000 mg | ORAL_TABLET | Freq: Every day | ORAL | Status: DC

## 2014-01-15 NOTE — Progress Notes (Signed)
Pt was started on Eliquis for Afib on 12/25/13.    Reviewed patients medication list.  Pt is not  currently on any combined P-gp and strong CYP3A4 inhibitors/inducers (ketoconazole, traconazole, ritonavir, carbamazepine, phenytoin, rifampin, St. John's wort).  Saw [name] today and labs will be drawn on 01/21/14 @ CHF clinic appt. Labs drawn today, 01/27/14. H&H are 14.9/45.5. SCr 1.3. Weight 106Kg. Dose appropriate based on specified criteria.   A full discussion of the nature of anticoagulants has been carried out.  A benefit/risk analysis has been presented to the patient, so that they understand the justification for choosing anticoagulation with Eliquis at this time.  The need for compliance is stressed.  Pt is aware to take the medication twice daily.  Side effects of potential bleeding are discussed, including unusual colored urine or stools, coughing up blood or coffee ground emesis, nose bleeds or serious fall or head trauma.  Discussed signs and symptoms of stroke. The patient should avoid any OTC items containing aspirin or ibuprofen.  Avoid alcohol consumption.   Call if any signs of abnormal bleeding.  Discussed financial obligations and resolved any difficulty in obtaining medication.  Next lab test test in 6 months on 07/28/14.

## 2014-01-15 NOTE — Progress Notes (Signed)
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 male with a h/o atrial fibrillation with possible TCM, EF of 20-25%, chronic LBBB. He has admitted in 7/15 with atrial fibrillation with RVR. Rate was very difficult to control. TEE failed due to inability to pass probe x 2. Eventually, ICE was done to rule out LAA thrombus and he was cardioverted to NSR.  He is on amiodarone now and in NSR. Echo in 7/15 showed EF 20% with diffuse hypokinesis in setting of atrial fibrillation/RVR. LHC was also done in 7/15 with minimal coronary disease. Repeat echo done early October continued to show LV dysfunction, EF 20-25%, despite optimal management and maintaining SR on amiodarone. Therefore, he is here today for evaluation to receive a BIV /ICD.  Today, he denies symptoms of palpitations, chest pain, shortness of breath, orthopnea, PND, lower extremity edema, dizziness, presyncope, syncope, or neurologic sequela. The patient is tolerating medications without difficulties and is otherwise  Only bothered by some mild fatigue compared to 6 months ago.  Past Medical History  Diagnosis Date  . Essential hypertension   . DJD (degenerative joint disease)     /osteoarthritis  . Frozen shoulder     right  . Chronic left hip pain   . Chronic low back pain     /sciatica- Dr Letta Median [facility])  . Asbestosis     mild  . GERD (gastroesophageal reflux disease)   . Hypogonadism male   . Coronary artery disease, non-occlusive June 2011    Cardiac cath in [facility] following abnormal Myoview  . Left bundle branch block (LBBB) on electrocardiogram     Diagnosed close to 20 years ago  . Obesity   . Factor V Leiden   . Testicular cancer 1972    left  . Type 2 diabetes mellitus without complication 04/7251    Type 2.   . Non-ischemic cardiomyopathy June 2011    EF 45-50%; suspect related to LBBB; repeat Echo March 2015: EF 50- 55%  .  Dyslipidemia, goal LDL below 100     On lovastatin (myalgias with Zocor and Lipitor)  . Atrial fibrillation    Past Surgical History  Procedure Laterality Date  . Lymph nodes    . Tonsillectomy Bilateral   . Surgery scrotal / testicular Left   . Appendectomy N/A   . Rotator cuff repair Right   . Cholecystectomy open    . Hernia repair    . Knee arthroscopy Left   . Cardiac catheterization  June 2011    Nonobstructive CAD  . Transthoracic echocardiogram  May 2011    EF 45% with diffuse hypokinesis; septal bounce from LBBB  . Transthoracic echocardiogram  March 2015    EF 50-55%. Mild concentric LVH. Septal dyssynergy from LBBB   . Cardioversion N/A 10/13/2013    Procedure: CARDIOVERSION;  Surgeon: Larey Dresser, MD;  Location: [place];  Service: Cardiovascular;  Laterality: N/A;  . Esophagogastroduodenoscopy N/A 10/15/2013    Procedure: ESOPHAGOGASTRODUODENOSCOPY (EGD);  Surgeon: Gatha Mayer, MD;  Location: [facility] ENDOSCOPY;  Service: Endoscopy;  Laterality: N/A;  bedside  . Cardioversion  10-16-2013    DCCV by [name] following intracardiac echo to rule out LAA thrombus    Current Outpatient Prescriptions  Medication Sig Dispense Refill  . albuterol (PROVENTIL HFA;VENTOLIN HFA) 108 (90 BASE) MCG/ACT inhaler Inhale 2 puffs into the lungs as needed for wheezing or shortness of breath.      Marland Kitchen  amiodarone (PACERONE) 200 MG tablet Take 1 tablet (200 mg total) by mouth daily.  90 tablet  3  . apixaban (ELIQUIS) 5 MG TABS tablet Take 1 tablet (5 mg total) by mouth 2 (two) times daily.  180 tablet  3  . cholecalciferol (VITAMIN D) 1000 UNITS tablet Take 1,000 Units by mouth daily.      . digoxin (LANOXIN) 0.125 MG tablet Take 1 tablet (0.125 mg total) by mouth daily.  90 tablet  3  . esomeprazole (NEXIUM) 40 MG capsule Take 40 mg by mouth daily at 12 noon.      . furosemide (LASIX) 40 MG tablet Take 40 mg by mouth daily as needed. For weight gain, swelling, shortness of breath, etc.      .  HYDROcodone-acetaminophen (NORCO/VICODIN) 5-325 MG per tablet Take 2 tablets by mouth every 6 (six) hours as needed for moderate pain.      Marland Kitchen lisinopril (PRINIVIL,ZESTRIL) 10 MG tablet Take 1 tablet (10 mg total) by mouth daily.  90 tablet  3  . lovastatin (ALTOPREV) 40 MG 24 hr tablet Take 40 mg by mouth at bedtime.      . magnesium oxide (MAG-OX) 400 MG tablet Take 400 mg by mouth daily.      . metFORMIN (GLUCOPHAGE) 500 MG tablet Take 500 mg by mouth 2 (two) times daily with a meal.      . metoprolol succinate (TOPROL-XL) 25 MG 24 hr tablet Take 1 tablet (25 mg total) by mouth daily.  90 tablet  3  . omega-3 acid ethyl esters (LOVAZA) 1 G capsule Take 1 g by mouth 2 (two) times daily.      Marland Kitchen rOPINIRole (REQUIP) 0.5 MG tablet Take 0.5 mg by mouth daily as needed (RLS).       Marland Kitchen rOPINIRole (REQUIP) 1 MG tablet Take 1 mg by mouth at bedtime.      . sildenafil (VIAGRA) 100 MG tablet Take 100 mg by mouth daily as needed for erectile dysfunction.      Marland Kitchen spironolactone (ALDACTONE) 25 MG tablet Take 0.5 tablets (12.5 mg total) by mouth daily.  45 tablet  3  . testosterone cypionate (DEPO-TESTOSTERONE) 200 MG/ML injection Inject into the muscle every 7 (seven) days.      Marland Kitchen tiZANidine (ZANAFLEX) 4 MG capsule Take 4 mg by mouth as needed for muscle spasms.       No current facility-administered medications for this visit.    Allergies  Allergen Reactions  . Statins Other (See Comments)    Myalgias.  Can only take Altoprev     History   Social History  . Marital Status: Married    Spouse Name: N/A    Number of Children: N/A  . Years of Education: N/A   Occupational History  . Not on file.   Social History Main Topics  . Smoking status: Former Smoker    Types: Cigarettes  . Smokeless tobacco: Not on file  . Alcohol Use: Yes     Comment: rare  . Drug Use: No  . Sexual Activity: Not on file   Other Topics Concern  . Not on file   Social History Narrative  . No narrative on file     Family History  Problem Relation Age of Onset  . Hypertension Mother   . Heart disease Mother   . Heart disease Father   . Hypertension Brother   . Heart attack Maternal Uncle   . Heart disease Paternal Uncle   .  Hypertension Brother     ROS- All systems are reviewed and negative except as per the HPI above  Physical Exam: Filed Vitals:   01/15/14 0836  BP: 132/84  Pulse: 63  Height: 6' (1.829 m)  Weight: 234 lb (106.142 kg)    GEN- The patient is well appearing, alert and oriented x 3 today.   Head- normocephalic, atraumatic Eyes-  Sclera clear, conjunctiva pink Ears- hearing intact Oropharynx- clear Neck- supple,   Lungs- Clear to ausculation bilaterally, normal work of breathing Heart- Regular rate and rhythm, no murmurs, rubs or gallops, PMI not laterally displaced GI- soft, NT, ND, + BS Extremities- no clubbing, cyanosis, or edema MS- no significant deformity or atrophy Skin- no rash or lesion Psych- euthymic mood, full affect Neuro- strength and sensation are intact  EKG-NSR at 63 bpm with LBBB QTC 474 ms.  Echo- Left ventricle: The cavity size was mildly dilated. Wall thickness was increased in a pattern of mild LVH. Systolic function was severely reduced. The estimated ejection fraction was in the range of 20% to 25%. - Left atrium: The atrium was moderately dilated. - Right atrium: The atrium was mildly dilated. - Impressions: Limited study Doppler was not done to evaluate valve function.   Assessment and Plan: 1. Afib- currently maintaining SR on amiodarone. TSH and liver panel checked 11/25/13 and within normal. No change.  2. LBBB with severe LV dysfuction-Pt given risk vrs benefit of implantation of BIV/ ICD, due to continuation of  severe LV dysfunction despite maintaining sinus rhythm on optimal management.for HF. He wants to pursue. Will schedule as soon as schedule allows.   I have seen, examined the patient, and reviewed the above  assessment and plan with Roderic Palau NP.  Changes to above are made where necessary.  The patient is maintaining sinus rhythm.  Despite optimal medical therapy, his EF remains depressed (20%)- nonischemic CM.  He has a LBBB with QRS > 150 msec.   At this time, he meets SCD-HeFT criteria for ICD implantation for primary prevention of sudden death. Given QRS > 150 msec, he also has a class I indication for CRT.  Risks, benefits, alternatives to BIV ICD implantation were discussed in detail with the patient today. The patient  understands that the risks include but are not limited to bleeding, infection, pneumothorax, perforation, tamponade, vascular damage, renal failure, MI, stroke, death, inappropriate shocks, and lead dislodgement and wishes to proceed.  We will therefore schedule device implantation at the next available time.   Co Sign: Thompson Grayer, MD 01/16/2014 9:22 AM

## 2014-01-15 NOTE — Patient Instructions (Signed)
Your physician has recommended that you have a defibrillator inserted. An implantable cardioverter defibrillator (ICD) is a small device that is placed in your chest or, in rare cases, your abdomen. This device uses electrical pulses or shocks to help control life-threatening, irregular heartbeats that could lead the heart to suddenly stop beating (sudden cardiac arrest). Leads are attached to the ICD that goes into your heart. This is done in the hospital and usually requires an overnight stay. Please see the instruction sheet given to you today for more information.  See Instruction sheet for procedure

## 2014-01-16 DIAGNOSIS — I519 Heart disease, unspecified: Secondary | ICD-10-CM | POA: Insufficient documentation

## 2014-01-16 DIAGNOSIS — I447 Left bundle-branch block, unspecified: Secondary | ICD-10-CM | POA: Insufficient documentation

## 2014-01-22 ENCOUNTER — Telehealth: Payer: Self-pay | Admitting: Cardiology

## 2014-01-22 NOTE — Telephone Encounter (Signed)
Can reschedule for CHF clinic after ICD placement (2-3 weeks).

## 2014-01-22 NOTE — Telephone Encounter (Signed)
Pt scheduled for ICD placement by [name] 02/04/14. Pt has an appt with Dr Aundra Dubin in [facility] 01/26/14, this was scheduled before pt saw Dr Lamount Cohen 01/15/14 and ICD placement was recommended.  Pt asking if he should keep the 01/26/14 appt with Dr Aundra Dubin or reschedule after ICD placement on 02/04/14.   I will forward to Dr Aundra Dubin for review.

## 2014-01-22 NOTE — Telephone Encounter (Signed)
New message     Has appt schedule on 11/10. Does he need to come since he had appt with Dr. Rayann Heman.

## 2014-01-22 NOTE — Telephone Encounter (Signed)
Pt advised, appt [facility] 01/26/14 cancelled and will send InBasket message to HF Clinic to reschedule appt in HF 2-3 weeks after ICD scheduled for 02/04/14.

## 2014-01-26 ENCOUNTER — Encounter (HOSPITAL_COMMUNITY): Payer: Medicare Other

## 2014-01-27 ENCOUNTER — Other Ambulatory Visit (INDEPENDENT_AMBULATORY_CARE_PROVIDER_SITE_OTHER): Payer: Medicare Other | Admitting: *Deleted

## 2014-01-27 DIAGNOSIS — I428 Other cardiomyopathies: Secondary | ICD-10-CM

## 2014-01-27 DIAGNOSIS — I480 Paroxysmal atrial fibrillation: Secondary | ICD-10-CM | POA: Diagnosis not present

## 2014-01-27 DIAGNOSIS — I429 Cardiomyopathy, unspecified: Secondary | ICD-10-CM

## 2014-01-27 LAB — CBC WITH DIFFERENTIAL/PLATELET
BASOS ABS: 0 10*3/uL (ref 0.0–0.1)
BASOS PCT: 0.4 % (ref 0.0–3.0)
EOS ABS: 0.1 10*3/uL (ref 0.0–0.7)
Eosinophils Relative: 1.1 % (ref 0.0–5.0)
HCT: 45.5 % (ref 39.0–52.0)
Hemoglobin: 14.9 g/dL (ref 13.0–17.0)
Lymphocytes Relative: 25 % (ref 12.0–46.0)
Lymphs Abs: 2.4 10*3/uL (ref 0.7–4.0)
MCHC: 32.7 g/dL (ref 30.0–36.0)
MCV: 95.5 fl (ref 78.0–100.0)
MONO ABS: 0.7 10*3/uL (ref 0.1–1.0)
Monocytes Relative: 7.4 % (ref 3.0–12.0)
NEUTROS PCT: 66.1 % (ref 43.0–77.0)
Neutro Abs: 6.5 10*3/uL (ref 1.4–7.7)
Platelets: 227 10*3/uL (ref 150.0–400.0)
RBC: 4.77 Mil/uL (ref 4.22–5.81)
RDW: 15 % (ref 11.5–15.5)
WBC: 9.8 10*3/uL (ref 4.0–10.5)

## 2014-01-27 LAB — BASIC METABOLIC PANEL
BUN: 26 mg/dL — ABNORMAL HIGH (ref 6–23)
CALCIUM: 9.5 mg/dL (ref 8.4–10.5)
CHLORIDE: 103 meq/L (ref 96–112)
CO2: 26 meq/L (ref 19–32)
Creatinine, Ser: 1.3 mg/dL (ref 0.4–1.5)
GFR: 60.68 mL/min (ref >60.00)
GLUCOSE: 117 mg/dL — AB (ref 70–99)
Potassium: 4.7 mEq/L (ref 3.5–5.1)
Sodium: 140 mEq/L (ref 135–145)

## 2014-02-03 MED ORDER — MUPIROCIN 2 % EX OINT
1.0000 "application " | TOPICAL_OINTMENT | Freq: Once | CUTANEOUS | Status: AC
  Administered 2014-02-04: 1 via TOPICAL

## 2014-02-03 MED ORDER — SODIUM CHLORIDE 0.9 % IR SOLN
80.0000 mg | Status: DC
  Filled 2014-02-03: qty 2

## 2014-02-03 MED ORDER — SODIUM CHLORIDE 0.9 % IV SOLN
INTRAVENOUS | Status: DC
  Administered 2014-02-04: 12:00:00 via INTRAVENOUS

## 2014-02-03 MED ORDER — CEFAZOLIN SODIUM-DEXTROSE 2-3 GM-% IV SOLR: 2.0000 g | INTRAVENOUS | Status: DC

## 2014-02-04 ENCOUNTER — Encounter (HOSPITAL_COMMUNITY)
Admission: RE | Disposition: A | Discharge status: Home / Self Care | Payer: Self-pay | Source: Ambulatory Visit | Attending: Internal Medicine

## 2014-02-04 ENCOUNTER — Ambulatory Visit (HOSPITAL_COMMUNITY)
Admission: RE | Admit: 2014-02-04 | Discharge: 2014-02-05 | Disposition: A | Discharge status: Home / Self Care | Payer: Medicare Other | Source: Ambulatory Visit | Attending: Internal Medicine | Admitting: Internal Medicine

## 2014-02-04 DIAGNOSIS — I429 Cardiomyopathy, unspecified: Secondary | ICD-10-CM | POA: Diagnosis not present

## 2014-02-04 DIAGNOSIS — R0602 Shortness of breath: Secondary | ICD-10-CM

## 2014-02-04 DIAGNOSIS — E119 Type 2 diabetes mellitus without complications: Secondary | ICD-10-CM | POA: Diagnosis not present

## 2014-02-04 DIAGNOSIS — I10 Essential (primary) hypertension: Secondary | ICD-10-CM | POA: Insufficient documentation

## 2014-02-04 DIAGNOSIS — Z8547 Personal history of malignant neoplasm of testis: Secondary | ICD-10-CM | POA: Diagnosis not present

## 2014-02-04 DIAGNOSIS — D682 Hereditary deficiency of other clotting factors: Secondary | ICD-10-CM | POA: Insufficient documentation

## 2014-02-04 DIAGNOSIS — I5022 Chronic systolic (congestive) heart failure: Secondary | ICD-10-CM | POA: Diagnosis not present

## 2014-02-04 DIAGNOSIS — R002 Palpitations: Secondary | ICD-10-CM

## 2014-02-04 DIAGNOSIS — I519 Heart disease, unspecified: Secondary | ICD-10-CM | POA: Diagnosis present

## 2014-02-04 DIAGNOSIS — I447 Left bundle-branch block, unspecified: Secondary | ICD-10-CM | POA: Diagnosis present

## 2014-02-04 DIAGNOSIS — I4891 Unspecified atrial fibrillation: Secondary | ICD-10-CM | POA: Diagnosis not present

## 2014-02-04 DIAGNOSIS — I509 Heart failure, unspecified: Secondary | ICD-10-CM | POA: Diagnosis not present

## 2014-02-04 DIAGNOSIS — I482 Chronic atrial fibrillation, unspecified: Secondary | ICD-10-CM

## 2014-02-04 DIAGNOSIS — I428 Other cardiomyopathies: Secondary | ICD-10-CM

## 2014-02-04 DIAGNOSIS — I2510 Atherosclerotic heart disease of native coronary artery without angina pectoris: Secondary | ICD-10-CM | POA: Diagnosis not present

## 2014-02-04 DIAGNOSIS — E785 Hyperlipidemia, unspecified: Secondary | ICD-10-CM | POA: Diagnosis not present

## 2014-02-04 DIAGNOSIS — I480 Paroxysmal atrial fibrillation: Secondary | ICD-10-CM

## 2014-02-04 DIAGNOSIS — M1990 Unspecified osteoarthritis, unspecified site: Secondary | ICD-10-CM | POA: Diagnosis not present

## 2014-02-04 DIAGNOSIS — I4819 Other persistent atrial fibrillation: Secondary | ICD-10-CM

## 2014-02-04 DIAGNOSIS — Z95810 Presence of automatic (implantable) cardiac defibrillator: Secondary | ICD-10-CM

## 2014-02-04 DIAGNOSIS — K219 Gastro-esophageal reflux disease without esophagitis: Secondary | ICD-10-CM | POA: Insufficient documentation

## 2014-02-04 HISTORY — PX: BI-VENTRICULAR IMPLANTABLE CARDIOVERTER DEFIBRILLATOR: SHX5459

## 2014-02-04 HISTORY — PX: CARDIAC DEFIBRILLATOR PLACEMENT: SHX171

## 2014-02-04 LAB — GLUCOSE, CAPILLARY: GLUCOSE-CAPILLARY: 115 mg/dL — AB (ref 70–99)

## 2014-02-04 LAB — SURGICAL PCR SCREEN
MRSA, PCR: NEGATIVE
Staphylococcus aureus: NEGATIVE

## 2014-02-04 SURGERY — BI-VENTRICULAR IMPLANTABLE CARDIOVERTER DEFIBRILLATOR  (CRT-D)
Anesthesia: LOCAL

## 2014-02-04 MED ORDER — MIDAZOLAM HCL 5 MG/5ML IJ SOLN
INTRAMUSCULAR | Status: AC
  Filled 2014-02-04: qty 5

## 2014-02-04 MED ORDER — METOPROLOL SUCCINATE ER 25 MG PO TB24
25.0000 mg | ORAL_TABLET | Freq: Every day | ORAL | Status: DC
Start: 2014-02-04 — End: 2014-02-05
  Administered 2014-02-04 – 2014-02-05 (×2): 25 mg via ORAL
  Filled 2014-02-04 (×2): qty 1

## 2014-02-04 MED ORDER — ROPINIROLE HCL 1 MG PO TABS
1.0000 mg | ORAL_TABLET | Freq: Every day | ORAL | Status: DC
Start: 2014-02-04 — End: 2014-02-05
  Administered 2014-02-04: 1 mg via ORAL
  Filled 2014-02-04: qty 1

## 2014-02-04 MED ORDER — CEFAZOLIN SODIUM 1-5 GM-% IV SOLN
1.0000 g | Freq: Four times a day (QID) | INTRAVENOUS | Status: AC
  Administered 2014-02-04 – 2014-02-05 (×3): 1 g via INTRAVENOUS
  Filled 2014-02-04 (×3): qty 50

## 2014-02-04 MED ORDER — ALBUTEROL SULFATE HFA 108 (90 BASE) MCG/ACT IN AERS: 2.0000 | INHALATION_SPRAY | RESPIRATORY_TRACT | Status: DC | PRN

## 2014-02-04 MED ORDER — ACETAMINOPHEN 325 MG PO TABS: 325.0000 mg | ORAL_TABLET | ORAL | Status: DC | PRN

## 2014-02-04 MED ORDER — LIDOCAINE HCL (PF) 1 % IJ SOLN
INTRAMUSCULAR | Status: AC
  Filled 2014-02-04: qty 30

## 2014-02-04 MED ORDER — ONDANSETRON HCL 4 MG/2ML IJ SOLN: 4.0000 mg | Freq: Four times a day (QID) | INTRAMUSCULAR | Status: DC | PRN

## 2014-02-04 MED ORDER — PANTOPRAZOLE SODIUM 40 MG PO TBEC
40.0000 mg | DELAYED_RELEASE_TABLET | Freq: Every day | ORAL | Status: DC
  Administered 2014-02-04 – 2014-02-05 (×2): 40 mg via ORAL
  Filled 2014-02-04 (×2): qty 1

## 2014-02-04 MED ORDER — FUROSEMIDE 40 MG PO TABS
40.0000 mg | ORAL_TABLET | Freq: Every day | ORAL | Status: DC
  Administered 2014-02-05: 40 mg via ORAL
  Filled 2014-02-04 (×2): qty 1

## 2014-02-04 MED ORDER — SODIUM CHLORIDE 0.9 % IJ SOLN
3.0000 mL | Freq: Two times a day (BID) | INTRAMUSCULAR | Status: DC
  Administered 2014-02-05: 3 mL via INTRAVENOUS

## 2014-02-04 MED ORDER — TEMAZEPAM 15 MG PO CAPS
15.0000 mg | ORAL_CAPSULE | Freq: Every evening | ORAL | Status: DC | PRN
  Administered 2014-02-05: 15 mg via ORAL
  Filled 2014-02-04: qty 1

## 2014-02-04 MED ORDER — LIDOCAINE HCL (PF) 1 % IJ SOLN
INTRAMUSCULAR | Status: AC
  Filled 2014-02-04: qty 60

## 2014-02-04 MED ORDER — SODIUM CHLORIDE 0.9 % IJ SOLN
3.0000 mL | INTRAMUSCULAR | Status: DC | PRN
Start: 2014-02-04 — End: 2014-02-05

## 2014-02-04 MED ORDER — FENTANYL CITRATE 0.05 MG/ML IJ SOLN
INTRAMUSCULAR | Status: AC
  Filled 2014-02-04: qty 2

## 2014-02-04 MED ORDER — CEFAZOLIN SODIUM-DEXTROSE 2-3 GM-% IV SOLR
INTRAVENOUS | Status: AC
  Filled 2014-02-04: qty 50

## 2014-02-04 MED ORDER — SODIUM CHLORIDE 0.9 % IV SOLN
250.0000 mL | INTRAVENOUS | Status: DC | PRN
Start: 2014-02-04 — End: 2014-02-05

## 2014-02-04 MED ORDER — MUPIROCIN 2 % EX OINT
TOPICAL_OINTMENT | CUTANEOUS | Status: AC
  Filled 2014-02-04: qty 22

## 2014-02-04 MED ORDER — HEPARIN (PORCINE) IN NACL 2-0.9 UNIT/ML-% IJ SOLN
INTRAMUSCULAR | Status: AC
  Filled 2014-02-04: qty 500

## 2014-02-04 MED ORDER — HYDROCODONE-ACETAMINOPHEN 5-325 MG PO TABS
1.0000 | ORAL_TABLET | ORAL | Status: DC | PRN
  Administered 2014-02-04 (×2): 1 via ORAL
  Administered 2014-02-05: 2 via ORAL
  Filled 2014-02-04 (×2): qty 2
  Filled 2014-02-04: qty 1

## 2014-02-04 MED ORDER — LISINOPRIL 10 MG PO TABS
10.0000 mg | ORAL_TABLET | Freq: Every day | ORAL | Status: DC
  Administered 2014-02-04 – 2014-02-05 (×2): 10 mg via ORAL
  Filled 2014-02-04 (×2): qty 1

## 2014-02-04 MED ORDER — CHLORHEXIDINE GLUCONATE 4 % EX LIQD
60.0000 mL | Freq: Once | CUTANEOUS | Status: DC
  Filled 2014-02-04: qty 60

## 2014-02-04 NOTE — H&P (View-Only) (Signed)
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 male with a h/o atrial fibrillation with possible TCM, EF of 20-25%, chronic LBBB. He has admitted in 7/15 with atrial fibrillation with RVR. Rate was very difficult to control. TEE failed due to inability to pass probe x 2. Eventually, ICE was done to rule out LAA thrombus and he was cardioverted to NSR.  He is on amiodarone now and in NSR. Echo in 7/15 showed EF 20% with diffuse hypokinesis in setting of atrial fibrillation/RVR. LHC was also done in 7/15 with minimal coronary disease. Repeat echo done early October continued to show LV dysfunction, EF 20-25%, despite optimal management and maintaining SR on amiodarone. Therefore, he is here today for evaluation to receive a BIV /ICD.  Today, he denies symptoms of palpitations, chest pain, shortness of breath, orthopnea, PND, lower extremity edema, dizziness, presyncope, syncope, or neurologic sequela. The patient is tolerating medications without difficulties and is otherwise  Only bothered by some mild fatigue compared to 6 months ago.  Past Medical History  Diagnosis Date  . Essential hypertension   . DJD (degenerative joint disease)     /osteoarthritis  . Frozen shoulder     right  . Chronic left hip pain   . Chronic low back pain     /sciatica- Dr Letta Median [facility])  . Asbestosis     mild  . GERD (gastroesophageal reflux disease)   . Hypogonadism male   . Coronary artery disease, non-occlusive June 2011    Cardiac cath in [facility] following abnormal Myoview  . Left bundle branch block (LBBB) on electrocardiogram     Diagnosed close to 20 years ago  . Obesity   . Factor V Leiden   . Testicular cancer 1972    left  . Type 2 diabetes mellitus without complication 07/6211    Type 2.   . Non-ischemic cardiomyopathy June 2011    EF 45-50%; suspect related to LBBB; repeat Echo March 2015: EF 50- 55%  .  Dyslipidemia, goal LDL below 100     On lovastatin (myalgias with Zocor and Lipitor)  . Atrial fibrillation    Past Surgical History  Procedure Laterality Date  . Lymph nodes    . Tonsillectomy Bilateral   . Surgery scrotal / testicular Left   . Appendectomy N/A   . Rotator cuff repair Right   . Cholecystectomy open    . Hernia repair    . Knee arthroscopy Left   . Cardiac catheterization  June 2011    Nonobstructive CAD  . Transthoracic echocardiogram  May 2011    EF 45% with diffuse hypokinesis; septal bounce from LBBB  . Transthoracic echocardiogram  March 2015    EF 50-55%. Mild concentric LVH. Septal dyssynergy from LBBB   . Cardioversion N/A 10/13/2013    Procedure: CARDIOVERSION;  Surgeon: Larey Dresser, MD;  Location: [place];  Service: Cardiovascular;  Laterality: N/A;  . Esophagogastroduodenoscopy N/A 10/15/2013    Procedure: ESOPHAGOGASTRODUODENOSCOPY (EGD);  Surgeon: Gatha Mayer, MD;  Location: [facility] ENDOSCOPY;  Service: Endoscopy;  Laterality: N/A;  bedside  . Cardioversion  10-16-2013    DCCV by [name] following intracardiac echo to rule out LAA thrombus    Current Outpatient Prescriptions  Medication Sig Dispense Refill  . albuterol (PROVENTIL HFA;VENTOLIN HFA) 108 (90 BASE) MCG/ACT inhaler Inhale 2 puffs into the lungs as needed for wheezing or shortness of breath.      Marland Kitchen  amiodarone (PACERONE) 200 MG tablet Take 1 tablet (200 mg total) by mouth daily.  90 tablet  3  . apixaban (ELIQUIS) 5 MG TABS tablet Take 1 tablet (5 mg total) by mouth 2 (two) times daily.  180 tablet  3  . cholecalciferol (VITAMIN D) 1000 UNITS tablet Take 1,000 Units by mouth daily.      . digoxin (LANOXIN) 0.125 MG tablet Take 1 tablet (0.125 mg total) by mouth daily.  90 tablet  3  . esomeprazole (NEXIUM) 40 MG capsule Take 40 mg by mouth daily at 12 noon.      . furosemide (LASIX) 40 MG tablet Take 40 mg by mouth daily as needed. For weight gain, swelling, shortness of breath, etc.      .  HYDROcodone-acetaminophen (NORCO/VICODIN) 5-325 MG per tablet Take 2 tablets by mouth every 6 (six) hours as needed for moderate pain.      Marland Kitchen lisinopril (PRINIVIL,ZESTRIL) 10 MG tablet Take 1 tablet (10 mg total) by mouth daily.  90 tablet  3  . lovastatin (ALTOPREV) 40 MG 24 hr tablet Take 40 mg by mouth at bedtime.      . magnesium oxide (MAG-OX) 400 MG tablet Take 400 mg by mouth daily.      . metFORMIN (GLUCOPHAGE) 500 MG tablet Take 500 mg by mouth 2 (two) times daily with a meal.      . metoprolol succinate (TOPROL-XL) 25 MG 24 hr tablet Take 1 tablet (25 mg total) by mouth daily.  90 tablet  3  . omega-3 acid ethyl esters (LOVAZA) 1 G capsule Take 1 g by mouth 2 (two) times daily.      Marland Kitchen rOPINIRole (REQUIP) 0.5 MG tablet Take 0.5 mg by mouth daily as needed (RLS).       Marland Kitchen rOPINIRole (REQUIP) 1 MG tablet Take 1 mg by mouth at bedtime.      . sildenafil (VIAGRA) 100 MG tablet Take 100 mg by mouth daily as needed for erectile dysfunction.      Marland Kitchen spironolactone (ALDACTONE) 25 MG tablet Take 0.5 tablets (12.5 mg total) by mouth daily.  45 tablet  3  . testosterone cypionate (DEPO-TESTOSTERONE) 200 MG/ML injection Inject into the muscle every 7 (seven) days.      Marland Kitchen tiZANidine (ZANAFLEX) 4 MG capsule Take 4 mg by mouth as needed for muscle spasms.       No current facility-administered medications for this visit.    Allergies  Allergen Reactions  . Statins Other (See Comments)    Myalgias.  Can only take Altoprev     History   Social History  . Marital Status: Married    Spouse Name: N/A    Number of Children: N/A  . Years of Education: N/A   Occupational History  . Not on file.   Social History Main Topics  . Smoking status: Former Smoker    Types: Cigarettes  . Smokeless tobacco: Not on file  . Alcohol Use: Yes     Comment: rare  . Drug Use: No  . Sexual Activity: Not on file   Other Topics Concern  . Not on file   Social History Narrative  . No narrative on file     Family History  Problem Relation Age of Onset  . Hypertension Mother   . Heart disease Mother   . Heart disease Father   . Hypertension Brother   . Heart attack Maternal Uncle   . Heart disease Paternal Uncle   .  Hypertension Brother     ROS- All systems are reviewed and negative except as per the HPI above  Physical Exam: Filed Vitals:   01/15/14 0836  BP: 132/84  Pulse: 63  Height: 6' (1.829 m)  Weight: 234 lb (106.142 kg)    GEN- The patient is well appearing, alert and oriented x 3 today.   Head- normocephalic, atraumatic Eyes-  Sclera clear, conjunctiva pink Ears- hearing intact Oropharynx- clear Neck- supple,   Lungs- Clear to ausculation bilaterally, normal work of breathing Heart- Regular rate and rhythm, no murmurs, rubs or gallops, PMI not laterally displaced GI- soft, NT, ND, + BS Extremities- no clubbing, cyanosis, or edema MS- no significant deformity or atrophy Skin- no rash or lesion Psych- euthymic mood, full affect Neuro- strength and sensation are intact  EKG-NSR at 63 bpm with LBBB QTC 474 ms.  Echo- Left ventricle: The cavity size was mildly dilated. Wall thickness was increased in a pattern of mild LVH. Systolic function was severely reduced. The estimated ejection fraction was in the range of 20% to 25%. - Left atrium: The atrium was moderately dilated. - Right atrium: The atrium was mildly dilated. - Impressions: Limited study Doppler was not done to evaluate valve function.   Assessment and Plan: 1. Afib- currently maintaining SR on amiodarone. TSH and liver panel checked 11/25/13 and within normal. No change.  2. LBBB with severe LV dysfuction-Pt given risk vrs benefit of implantation of BIV/ ICD, due to continuation of  severe LV dysfunction despite maintaining sinus rhythm on optimal management.for HF. He wants to pursue. Will schedule as soon as schedule allows.   I have seen, examined the patient, and reviewed the above  assessment and plan with Roderic Palau NP.  Changes to above are made where necessary.  The patient is maintaining sinus rhythm.  Despite optimal medical therapy, his EF remains depressed (20%)- nonischemic CM.  He has a LBBB with QRS > 150 msec.   At this time, he meets SCD-HeFT criteria for ICD implantation for primary prevention of sudden death. Given QRS > 150 msec, he also has a class I indication for CRT.  Risks, benefits, alternatives to BIV ICD implantation were discussed in detail with the patient today. The patient  understands that the risks include but are not limited to bleeding, infection, pneumothorax, perforation, tamponade, vascular damage, renal failure, MI, stroke, death, inappropriate shocks, and lead dislodgement and wishes to proceed.  We will therefore schedule device implantation at the next available time.   Co Sign: Thompson Grayer, MD 01/16/2014 9:22 AM

## 2014-02-04 NOTE — Op Note (Signed)
SURGEON:  Thompson Grayer, MD      PREPROCEDURE DIAGNOSES:   1. Nonischemic cardiomyopathy.   2. New York Heart Association class III, heart failure chronically.   3. Left bundle-branch block.      POSTPROCEDURE DIAGNOSES:   1. Nonischemic cardiomyopathy.   2. New York Heart Association class III heart failure chronically.   3. Left bundle-branch block.      PROCEDURES:    1. Left upper extremity venography  2. Biventricular ICD implantation.     INTRODUCTION:  Walter [name] is a 68 y.o. male with a nonischemic CM (EF 20%), NYHA Class III CHF, and LBBB QRS morophology. At this time, he meets MADIT II/ SCD-HeFT criteria for ICD implantation for primary prevention of sudden death.  Given LBBB, the patient may also be expected to benefit from resynchronization therapy.  The patient has been treated with an optimal medical regimen but continues to have a depressed ejection fraction and NYHA Class III CHF symptoms.  he therefore  presents today for a biventricular ICD implantation.      DESCRIPTION OF PROCEDURE:  Informed written consent was obtained and the patient was brought to the electrophysiology lab in the fasting state. The patient was adequately sedated with intravenous Versed, and fentanyl as outlined in the nursing report.  The patient's left chest was prepped and draped in the usual sterile fashion by the EP lab staff.  The skin overlying the left deltopectoral region was infiltrated with lidocaine for local analgesia.  A 5-cm incision was made over the left deltopectoral region.  A left subcutaneous defibrillator pocket was fashioned using a combination of sharp and blunt dissection.  Electrocautery was used to assure hemostasis.   Left Upper extremity Venography:  A venogram of the left upper extremity was performed which revealed a large sized left axillary vein which emptied into a moderate sized left subclavian vein.    RA/RV Lead Placement: The left axillary vein was cannulated with  fluoroscopic visualization. Through the left axillary vein, a St. Jude Medical Tendril STS, model 2088TC-52  (serial #  [id number]) right atrial lead and a St. Jude Medical [place], model 7122Q-65 (serial number [id number]) right ventricular defibrillator lead were advanced with fluoroscopic visualization into the right atrial appendage and right ventricular apex positions respectively.  Initial atrial lead P-waves measured 3.6 mV with an impedance of 486 ohms and a threshold of 0.9 volts at 0.5 milliseconds.  The right ventricular lead R-wave measured 13 mV with impedance of 718 ohms and a threshold of 0.4 volts at 0.5 milliseconds.   LV Lead Placement: A Medtronic MB-2 guide was advanced through the left axillary vein into the low lateral right atrium.  A Bard curved Damato catheter was introduced through the MB-2 guide and used to cannulate the coronary sinus.  Coronary sinus cannulation was confirmed with electrogram recording from the hexapolar catheter.  A coronary sinus selective venography balloon was advanced through the MB- 2 guide and advanced into the proximal portion of the coronary sinus.  A selective coronary sinus venogram was performed by hand injection of nonionic contrast.  This demonstrated a moderate sized lateral coronary sinus branch.  A Whisper CSJ wire was introduced through the transseptal sheath and advanced into the lateral branch.  A [place] [phone] - 86 (serial number [id number]) lead was advanced through the MB-2 into the distal posterolateral branch.   This was  approximately one-thirds from the base to the apex in a very lateral position.  In this location, the left ventricular lead R-waves measured  26 mV with impedance of 347 ohms and a threshold of 0.8 volt at 0.5  milliseconds in the M3D4 configuration with no diaphragmatic stimulation observed when pacing at 10 volts output.  The MB-2 guide was therefore removed.    All three leads were secured to  the pectoralis  fascia using #2 silk suture over the suture sleeves.  The pocket then irrigated with copious gentamicin solution.  The leads were then connected to a [place] 3365-40Q (serial  Number [id number]) biventricular ICD.  The defibrillator was placed into the  pocket.  The pocket was then closed in 2 layers with 2.0 Vicryl suture  for the subcutaneous and subcuticular layers. EBL<53ml.  Steri-Strips and a  sterile dressing were then applied.  There were no early apparent complications.     CONCLUSIONS:   1. Nonischemic cardiomyopathy with Left bundle-branch block and chronic New York Heart Association class III heart failure.   2. Successful biventricular ICD implantation.   3. No early apparent complications.

## 2014-02-04 NOTE — Interval H&P Note (Signed)
History and Physical Interval Note:   ICD Criteria  Current LVEF:20% ;Obtained > or = 1 month ago and < or = 3 months ago.  NYHA Functional Classification: Class III  Heart Failure History:  Yes, Duration of heart failure since onset is 3 to 9 months  Non-Ischemic Dilated Cardiomyopathy History:  Yes, timeframe is 3 to 9 months  Atrial Fibrillation/Atrial Flutter:  Yes, A-Fib/A-Flutter type: Persistent (>7 days).  Ventricular Tachycardia History:  No.  Cardiac Arrest History:  No  History of Syndromes with Risk of Sudden Death:  No.  Previous ICD:  No.  Electrophysiology Study: No.  Prior MI: No.  PPM: No.  OSA:  No  Patient Life Expectancy of >=1 year: Yes.  Anticoagulation Therapy:  Patient is on anticoagulation therapy, anticoagulation was held prior to procedure.   Beta Blocker Therapy:  Yes.   Ace Inhibitor/ARB Therapy:  Yes.    02/04/2014 1:11 PM  Walter [name]  has presented today for surgery, with the diagnosis of non-ischemic cardiomyopathy, afib  The various methods of treatment have been discussed with the patient and family. After consideration of risks, benefits and other options for treatment, the patient has consented to  Procedure(s): BI-VENTRICULAR IMPLANTABLE CARDIOVERTER DEFIBRILLATOR  (CRT-D) (N/A) as a surgical intervention .  The patient's history has been reviewed, patient examined, no change in status, stable for surgery.  I have reviewed the patient's chart and labs.  Questions were answered to the patient's satisfaction.     Thompson Grayer

## 2014-02-05 ENCOUNTER — Ambulatory Visit (HOSPITAL_COMMUNITY): Payer: Medicare Other

## 2014-02-05 ENCOUNTER — Other Ambulatory Visit: Payer: Self-pay

## 2014-02-05 ENCOUNTER — Encounter (HOSPITAL_COMMUNITY): Payer: Self-pay | Admitting: Physician Assistant

## 2014-02-05 DIAGNOSIS — I519 Heart disease, unspecified: Secondary | ICD-10-CM

## 2014-02-05 DIAGNOSIS — I429 Cardiomyopathy, unspecified: Secondary | ICD-10-CM | POA: Diagnosis not present

## 2014-02-05 DIAGNOSIS — I447 Left bundle-branch block, unspecified: Secondary | ICD-10-CM

## 2014-02-05 DIAGNOSIS — I10 Essential (primary) hypertension: Secondary | ICD-10-CM | POA: Diagnosis not present

## 2014-02-05 DIAGNOSIS — Z95810 Presence of automatic (implantable) cardiac defibrillator: Secondary | ICD-10-CM | POA: Diagnosis not present

## 2014-02-05 DIAGNOSIS — I509 Heart failure, unspecified: Secondary | ICD-10-CM | POA: Diagnosis not present

## 2014-02-05 DIAGNOSIS — I4891 Unspecified atrial fibrillation: Secondary | ICD-10-CM | POA: Diagnosis not present

## 2014-02-05 DIAGNOSIS — I2510 Atherosclerotic heart disease of native coronary artery without angina pectoris: Secondary | ICD-10-CM | POA: Diagnosis not present

## 2014-02-05 LAB — BASIC METABOLIC PANEL
Anion gap: 12 (ref 5–15)
BUN: 19 mg/dL (ref 6–23)
CHLORIDE: 100 meq/L (ref 96–112)
CO2: 27 mEq/L (ref 19–32)
Calcium: 9.1 mg/dL (ref 8.4–10.5)
Creatinine, Ser: 1.12 mg/dL (ref 0.50–1.35)
GFR calc Af Amer: 75 mL/min — ABNORMAL LOW (ref >90)
GFR, EST NON AFRICAN AMERICAN: 65 mL/min — AB (ref >90)
GLUCOSE: 111 mg/dL — AB (ref 70–99)
POTASSIUM: 4.1 meq/L (ref 3.7–5.3)
SODIUM: 139 meq/L (ref 137–147)

## 2014-02-05 NOTE — Progress Notes (Signed)
   SUBJECTIVE: The patient is doing well today.  At this time, he denies chest pain, shortness of breath, or any new concerns.  . furosemide  40 mg Oral Daily  . lisinopril  10 mg Oral Daily  . metoprolol succinate  25 mg Oral Daily  . pantoprazole  40 mg Oral Daily  . rOPINIRole  1 mg Oral QHS  . sodium chloride  3 mL Intravenous Q12H      OBJECTIVE: Physical Exam: Filed Vitals:   02/04/14 1809 02/04/14 2309 02/05/14 0500 02/05/14 0750  BP: 146/95 117/75 125/84 134/91  Pulse: 91 70 76 70  Temp:  98.1 F (36.7 C) 97.5 F (36.4 C) 98.5 F (36.9 C)  TempSrc:  Oral Oral Oral  Resp: 16  17 16   Height:      Weight:   232 lb 6.4 oz (105.416 kg)   SpO2: 96% 93% 94% 98%    Intake/Output Summary (Last 24 hours) at 02/05/14 0933 Last data filed at 02/05/14 0500  Gross per 24 hour  Intake    260 ml  Output    750 ml  Net   -490 ml    Telemetry reveals sinus rhythm  GEN- The patient is well appearing, alert and oriented x 3 today.   Head- normocephalic, atraumatic Eyes-  Sclera clear, conjunctiva pink Ears- hearing intact Oropharynx- clear Neck- supple  Lungs- Clear to ausculation bilaterally, normal work of breathing Heart- Regular rate and rhythm, no murmurs, rubs or gallops, PMI not laterally displaced GI- soft, NT, ND, + BS Extremities- no clubbing, cyanosis, or edema Skin- ICD pocket is without hematoma  LABS: Basic Metabolic Panel:  Recent Labs  02/05/14 0445  NA 139  K 4.1  CL 100  CO2 27  GLUCOSE 111*  BUN 19  CREATININE 1.12  CALCIUM 9.1   RADIOLOGY: Dg Chest 2 View  02/05/2014   CLINICAL DATA:  Status post pacemaker defibrillator placement  EXAM: CHEST  2 VIEW  COMPARISON:  Portable chest x-ray of October 09, 2013  FINDINGS: The lungs are adequately inflated. There is no pneumothorax or significant pleural effusion. There are calcified pleural plaques over the upper lung zones bilaterally. The heart and pulmonary vascularity are normal. The permanent  pacemaker-defibrillator is in appropriate position radiographically. The pulmonary vascularity is not engorged. There is mild tortuosity of the descending thoracic aorta.  IMPRESSION: There is no postprocedure complication following placement of the permanent pacemaker-defibrillator.   Electronically Signed   By: David  Martinique   On: 02/05/2014 08:20    ASSESSMENT AND PLAN:  Active Problems:   Nonischemic cardiomyopathy   Cardiomyopathy, nonischemic   Chronic systolic dysfunction of left ventricle   Left bundle branch block  1. Nonischemic CM/ chronic systolic dysfunction/ LBBB Doing well s/p ICD Device interrogation is reviewed and normal CXR reveals stable leads, no ptx  DC to home Routine wound care and follow-up    Thompson Grayer, MD 02/05/2014 9:33 AM

## 2014-02-05 NOTE — Progress Notes (Signed)
Discharge order received.  Discharge instructions reviewed with patient and wife.  Patient instructed to resume Eliquis on SUNDAY 11/22 per Dr. Rayann Heman.

## 2014-02-05 NOTE — Discharge Summary (Signed)
Physician Discharge Summary    Cardiologist:  McLean/Nyazia Canevari  Patient ID: Walter [name] MRN: [id number] DOB/AGE: [birth year] 70 y.o.  Admit date: 02/04/2014 Discharge date: 02/05/2014  Admission Diagnoses:  Nonischemic cardiomyopathy  Discharge Diagnoses:  Active Problems:   Nonischemic cardiomyopathy   Cardiomyopathy, nonischemic   Chronic systolic dysfunction of left ventricle   Left bundle branch block   Discharged Condition: stable  Hospital Course:   Walter [name] is a 68 y.o. male with a h/o atrial fibrillation with possible TCM, EF of 20-25%, chronic LBBB. He has admitted in 7/15 with atrial fibrillation with RVR. Rate was very difficult to control. TEE failed due to inability to pass probe x 2. Eventually, ICE was done to rule out LAA thrombus and he was cardioverted to NSR.  He is on amiodarone now and in NSR. Echo in 7/15 showed EF 20% with diffuse hypokinesis in setting of atrial fibrillation/RVR. LHC was also done in 7/15 with minimal coronary disease. Repeat echo done early October continued to show LV dysfunction, EF 20-25%, despite optimal management and maintaining SR on amiodarone. Therefore, he presented for evaluation to receive a BIV /ICD.  He underwent successful implant of a [place] 3365-40Q (serial Number [id number]) biventricular ICD.  Cxr revealed no complications.  The patient was seen by Dr. Rayann Heman who felt he was stable for DC home.    Scheduled wound check.    Consults:   None  Significant Diagnostic Studies: CHEST 2 VIEW  COMPARISON: Portable chest x-ray of October 09, 2013  FINDINGS: The lungs are adequately inflated. There is no pneumothorax or significant pleural effusion. There are calcified pleural plaques over the upper lung zones bilaterally. The heart and pulmonary vascularity are normal. The permanent pacemaker-defibrillator is in appropriate position radiographically. The pulmonary vascularity is not  engorged. There is mild tortuosity of the descending thoracic aorta.  IMPRESSION: There is no postprocedure complication following placement of the permanent pacemaker-defibrillator.   PREPROCEDURE DIAGNOSES:  1. Nonischemic cardiomyopathy.  2. New York Heart Association class III, heart failure chronically.  3. Left bundle-branch block.    POSTPROCEDURE DIAGNOSES:  1. Nonischemic cardiomyopathy.  2. New York Heart Association class III heart failure chronically.  3. Left bundle-branch block.    PROCEDURES:  1. Left upper extremity venography 2. Biventricular ICD implantation.   INTRODUCTION: Walter [name] is a 68 y.o. male with a nonischemic CM (EF 20%), NYHA Class III CHF, and LBBB QRS morophology. At this time, he meets MADIT II/ SCD-HeFT criteria for ICD implantation for primary prevention of sudden death. Given LBBB, the patient may also be expected to benefit from resynchronization therapy. The patient has been treated with an optimal medical regimen but continues to have a depressed ejection fraction and NYHA Class III CHF symptoms. he therefore presents today for a biventricular ICD implantation.    DESCRIPTION OF PROCEDURE: Informed written consent was obtained and the patient was brought to the electrophysiology lab in the fasting state. The patient was adequately sedated with intravenous Versed, and fentanyl as outlined in the nursing report. The patient's left chest was prepped and draped in the usual sterile fashion by the EP lab staff. The skin overlying the left deltopectoral region was infiltrated with lidocaine for local analgesia. A 5-cm incision was made over the left deltopectoral region. A left subcutaneous defibrillator pocket was fashioned using a combination of sharp and blunt dissection. Electrocautery was used to assure hemostasis.   Left Upper extremity Venography: A venogram of the left  upper extremity was performed which  revealed a large sized left axillary vein which emptied into a moderate sized left subclavian vein.   RA/RV Lead Placement: The left axillary vein was cannulated with fluoroscopic visualization. Through the left axillary vein, a St. Jude Medical Tendril STS, model 2088TC-52 (serial # [id number]) right atrial lead and a St. Jude Medical [place], model 7122Q-65 (serial number [id number]) right ventricular defibrillator lead were advanced with fluoroscopic visualization into the right atrial appendage and right ventricular apex positions respectively. Initial atrial lead P-waves measured 3.6 mV with an impedance of 486 ohms and a threshold of 0.9 volts at 0.5 milliseconds. The right ventricular lead R-wave measured 13 mV with impedance of 718 ohms and a threshold of 0.4 volts at 0.5 milliseconds.   LV Lead Placement: A Medtronic MB-2 guide was advanced through the left axillary vein into the low lateral right atrium. A Bard curved Damato catheter was introduced through the MB-2 guide and used to cannulate the coronary sinus. Coronary sinus cannulation was confirmed with electrogram recording from the hexapolar catheter. A coronary sinus selective venography balloon was advanced through the MB- 2 guide and advanced into the proximal portion of the coronary sinus. A selective coronary sinus venogram was performed by hand injection of nonionic contrast. This demonstrated a moderate sized lateral coronary sinus branch.  A Whisper CSJ wire was introduced through the transseptal sheath and advanced into the lateral branch. A [place] [phone] (serial number [id number]) lead was advanced through the MB-2 into the distal posterolateral branch. This was approximately one-thirds from the base to the apex in a very lateral position. In this location, the left ventricular lead R-waves measured 26 mV with impedance of 347 ohms and a threshold of 0.8 volt at 0.5 milliseconds in  the M3D4 configuration with no diaphragmatic stimulation observed when pacing at 10 volts output. The MB-2 guide was therefore removed.   All three leads were secured to the pectoralis fascia using #2 silk suture over the suture sleeves. The pocket then irrigated with copious gentamicin solution. The leads were then connected to a [place] 3365-40Q (serial Number [id number]) biventricular ICD. The defibrillator was placed into the pocket. The pocket was then closed in 2 layers with 2.0 Vicryl suture for the subcutaneous and subcuticular layers. EBL<53ml. Steri-Strips and a sterile dressing were then applied. There were no early apparent complications.   CONCLUSIONS:  1. Nonischemic cardiomyopathy with Left bundle-branch block and chronic New York Heart Association class III heart failure.  2. Successful biventricular ICD implantation.  3. No early apparent complications.   Treatments: See above  Discharge Exam: Blood pressure 134/91, pulse 70, temperature 98.5 F (36.9 C), temperature source Oral, resp. rate 16, height 6' (1.829 m), weight 232 lb 6.4 oz (105.416 kg), SpO2 98 %.   Disposition: 01-Home or Self Care      Discharge Instructions    Call MD for:  redness, tenderness, or signs of infection (pain, swelling, redness, odor or green/yellow discharge around incision site)    Complete by:  As directed      Diet - low sodium heart healthy    Complete by:  As directed      Increase activity slowly    Complete by:  As directed             Medication List    TAKE these medications        albuterol 108 (90 BASE) MCG/ACT  inhaler  Commonly known as:  PROVENTIL HFA;VENTOLIN HFA  Inhale 2 puffs into the lungs as needed for wheezing or shortness of breath.     amiodarone 200 MG tablet  Commonly known as:  PACERONE  Take 1 tablet (200 mg total) by mouth daily.     apixaban 5 MG Tabs tablet  Commonly known as:  ELIQUIS  Take 1  tablet (5 mg total) by mouth 2 (two) times daily.     cholecalciferol 1000 UNITS tablet  Commonly known as:  VITAMIN D  Take 1,000 Units by mouth every morning.     DEPO-TESTOSTERONE 200 MG/ML injection  Generic drug:  testosterone cypionate  Inject 200 mg into the muscle every Thursday.     digoxin 0.125 MG tablet  Commonly known as:  LANOXIN  Take 1 tablet (0.125 mg total) by mouth daily.     esomeprazole 40 MG capsule  Commonly known as:  NEXIUM  Take 40 mg by mouth daily at 12 noon.     furosemide 40 MG tablet  Commonly known as:  LASIX  Take 40 mg by mouth daily as needed. For weight gain, swelling, shortness of breath, etc.     HYDROcodone-acetaminophen 5-325 MG per tablet  Commonly known as:  NORCO/VICODIN  Take 2 tablets by mouth every 6 (six) hours as needed for moderate pain.     lisinopril 10 MG tablet  Commonly known as:  PRINIVIL,ZESTRIL  Take 1 tablet (10 mg total) by mouth daily.     lovastatin 40 MG 24 hr tablet  Commonly known as:  ALTOPREV  Take 40 mg by mouth at bedtime.     magnesium oxide 400 MG tablet  Commonly known as:  MAG-OX  Take 400 mg by mouth every morning.     metFORMIN 1000 MG tablet  Commonly known as:  GLUCOPHAGE  Take 1,000 mg by mouth 2 (two) times daily with a meal.     metoprolol succinate 25 MG 24 hr tablet  Commonly known as:  TOPROL-XL  Take 1 tablet (25 mg total) by mouth daily.     omega-3 acid ethyl esters 1 G capsule  Commonly known as:  LOVAZA  Take 1 g by mouth 2 (two) times daily.     potassium chloride SA 20 MEQ tablet  Commonly known as:  K-DUR,KLOR-CON  Take 20 mEq by mouth daily as needed (for low potassium). Take when taking furosemide     rOPINIRole 1 MG tablet  Commonly known as:  REQUIP  Take 1 mg by mouth at bedtime.     rOPINIRole 0.5 MG tablet  Commonly known as:  REQUIP  Take 0.5 mg by mouth daily as needed (RLS).     sildenafil 100 MG tablet  Commonly known as:  VIAGRA  Take 100 mg by mouth  daily as needed for erectile dysfunction.     spironolactone 25 MG tablet  Commonly known as:  ALDACTONE  Take 0.5 tablets (12.5 mg total) by mouth daily.     tiZANidine 4 MG capsule  Commonly known as:  ZANAFLEX  Take 4 mg by mouth as needed for muscle spasms.       Follow-up Information    Follow up with [facility] On 02/17/2014.   Why:  12:00 PM   Contact information:   [place]  [address] [phone]      Signed: Tarri Fuller, [place] 02/05/2014, 10:18 AM  Thompson Grayer MD

## 2014-02-05 NOTE — Discharge Instructions (Signed)
Supplemental Discharge Instructions for  Pacemaker/Defibrillator Patients  Activity No heavy lifting or vigorous activity with your left/right arm for 6 to 8 weeks.  Do not raise your left/right arm above your head for one week.  Gradually raise your affected arm as drawn below.           __  NO DRIVING for  One week  ; you may begin driving on  75/44/92    .  WOUND CARE - Keep the wound area clean and dry.  Do not get this area wet for one week. No showers for one week; you may shower on  02/11/14    . - The tape/steri-strips on your wound will fall off; do not pull them off.  No bandage is needed on the site.  DO  NOT apply any creams, oils, or ointments to the wound area. - If you notice any drainage or discharge from the wound, any swelling or bruising at the site, or you develop a fever > 101? F after you are discharged home, call the office at once.  Special Instructions - You are still able to use cellular telephones; use the ear opposite the side where you have your pacemaker/defibrillator.  Avoid carrying your cellular phone near your device. - When traveling through airports, show security personnel your identification card to avoid being screened in the metal detectors.  Ask the security personnel to use the hand wand. - Avoid arc welding equipment, MRI testing (magnetic resonance imaging), TENS units (transcutaneous nerve stimulators).  Call the office for questions about other devices. - Avoid electrical appliances that are in poor condition or are not properly grounded. - Microwave ovens are safe to be near or to operate.  Additional information for defibrillator patients should your device go off: - If your device goes off ONCE and you feel fine afterward, notify the device clinic nurses. - If your device goes off ONCE and you do not feel well afterward, call 911. - If your device goes off TWICE, call 911. - If your device goes off THREE times in one day, call  911.  DO NOT DRIVE YOURSELF OR A FAMILY MEMBER WITH A DEFIBRILLATOR TO THE HOSPITAL--CALL 911.   Cardiac Diet This diet can help prevent heart disease and stroke. Many factors influence your heart health, including eating and exercise habits. Coronary risk rises a lot with abnormal blood fat (lipid) levels. Cardiac meal planning includes limiting unhealthy fats, increasing healthy fats, and making other small dietary changes. General guidelines are as follows:  Adjust calorie intake to reach and maintain desirable body weight.  Limit total fat intake to less than 30% of total calories. Saturated fat should be less than 7% of calories.  Saturated fats are found in animal products and in some vegetable products. Saturated vegetable fats are found in coconut oil, cocoa butter, palm oil, and palm kernel oil. Read labels carefully to avoid these products as much as possible. Use butter in moderation. Choose tub margarines and oils that have 2 grams of fat or less. Good cooking oils are canola and olive oils.  Practice low-fat cooking techniques. Do not fry food. Instead, broil, bake, boil, steam, grill, roast on a rack, stir-fry, or microwave it. Other fat reducing suggestions include:  Remove the skin from poultry.  Remove all visible fat from meats.  Skim the fat off stews, soups, and gravies before serving them.  Steam vegetables in water or broth instead of sauting them in fat.  Avoid foods with trans fat (or hydrogenated oils), such as commercially fried foods and commercially baked goods. Commercial shortening and deep-frying fats will contain trans fat.  Increase intake of fruits, vegetables, whole grains, and legumes to replace foods high in fat.  Increase consumption of nuts, legumes, and seeds to at least 4 servings weekly. One serving of a legume equals  cup, and 1 serving of nuts or seeds equals  cup.  Choose whole grains more often. Have 3 servings per day (a serving is 1  ounce [oz]).  Eat 4 to 5 servings of vegetables per day. A serving of vegetables is 1 cup of raw leafy vegetables;  cup of raw or cooked cut-up vegetables;  cup of vegetable juice.  Eat 4 to 5 servings of fruit per day. A serving of fruit is 1 medium whole fruit;  cup of dried fruit;  cup of fresh, frozen, or canned fruit;  cup of 100% fruit juice.  Increase your intake of dietary fiber to 20 to 30 grams per day. Insoluble fiber may help lower your risk of heart disease and may help curb your appetite.  Soluble fiber binds cholesterol to be removed from the blood. Foods high in soluble fiber are dried beans, citrus fruits, oats, apples, bananas, broccoli, Brussels sprouts, and eggplant.  Try to include foods fortified with plant sterols or stanols, such as yogurt, breads, juices, or margarines. Choose several fortified foods to achieve a daily intake of 2 to 3 grams of plant sterols or stanols.  Foods with omega-3 fats can help reduce your risk of heart disease. Aim to have a 3.5 oz portion of fatty fish twice per week, such as salmon, mackerel, albacore tuna, sardines, lake trout, or herring. If you wish to take a fish oil supplement, choose one that contains 1 gram of both DHA and EPA.  Limit processed meats to 2 servings (3 oz portion) weekly.  Limit the sodium in your diet to 1500 milligrams (mg) per day. If you have high blood pressure, talk to a registered dietitian about a DASH (Dietary Approaches to Stop Hypertension) eating plan.  Limit sweets and beverages with added sugar, such as soda, to no more than 5 servings per week. One serving is:   1 tablespoon sugar.  1 tablespoon jelly or jam.   cup sorbet.  1 cup lemonade.   cup regular soda. CHOOSING FOODS Starches  Allowed: Breads: All kinds (wheat, rye, raisin, white, oatmeal, [place], [place], and English muffin bread). Low-fat rolls: English muffins, frankfurter and hamburger buns, bagels, pita bread, tortillas (not  fried). Pancakes, waffles, biscuits, and muffins made with recommended oil.  Avoid: Products made with saturated or trans fats, oils, or whole milk products. Butter rolls, cheese breads, croissants. Commercial doughnuts, muffins, sweet rolls, biscuits, waffles, pancakes, store-bought mixes. Crackers  Allowed: Low-fat crackers and snacks: Animal, graham, rye, saltine (with recommended oil, no lard), oyster, and matzo crackers. Bread sticks, melba toast, rusks, flatbread, pretzels, and light popcorn.  Avoid: High-fat crackers: cheese crackers, butter crackers, and those made with coconut, palm oil, or trans fat (hydrogenated oils). Buttered popcorn. Cereals  Allowed: Hot or cold whole-grain cereals.  Avoid: Cereals containing coconut, hydrogenated vegetable fat, or animal fat. Potatoes / Pasta / Rice  Allowed: All kinds of potatoes, rice, and pasta (such as macaroni, spaghetti, and noodles).  Avoid: Pasta or rice prepared with cream sauce or high-fat cheese. Chow mein noodles, [place] fries. Vegetables  Allowed: All vegetables and vegetable juices.  Avoid: Fried vegetables.  Vegetables in cream, butter, or high-fat cheese sauces. Limit coconut. Fruit in cream or custard. Protein  Allowed: Limit your intake of meat, seafood, and poultry to no more than 6 oz (cooked weight) per day. All lean, well-trimmed beef, veal, pork, and lamb. All chicken and [place] without skin. All fish and shellfish. Wild game: wild duck, rabbit, pheasant, and venison. Egg whites or low-cholesterol egg substitutes may be used as desired. Meatless dishes: recipes with dried beans, peas, lentils, and tofu (soybean curd). Seeds and nuts: all seeds and most nuts.  Avoid: Prime grade and other heavily marbled and fatty meats, such as short ribs, spare ribs, rib eye roast or steak, frankfurters, sausage, bacon, and high-fat luncheon meats, mutton. Caviar. Commercially fried fish. Domestic duck, goose, venison sausage. Organ  meats: liver, gizzard, heart, chitterlings, brains, kidney, sweetbreads. Dairy  Allowed: Low-fat cheeses: nonfat or low-fat cottage cheese (1% or 2% fat), cheeses made with part skim milk, such as mozzarella, farmers, string, or ricotta. (Cheeses should be labeled no more than 2 to 6 grams fat per oz.). Skim (or 1%) milk: liquid, powdered, or evaporated. Buttermilk made with low-fat milk. Drinks made with skim or low-fat milk or cocoa. Chocolate milk or cocoa made with skim or low-fat (1%) milk. Nonfat or low-fat yogurt.  Avoid: Whole milk cheeses, including colby, cheddar, mu[place], Monte[place] Jack, [place], [place], [place], American, Swiss, and blue. Creamed cottage cheese, cream cheese. Whole milk and whole milk products, including buttermilk or yogurt made from whole milk, drinks made from whole milk. Condensed milk, evaporated whole milk, and 2% milk. Soups and Combination Foods  Allowed: Low-fat low-sodium soups: broth, dehydrated soups, homemade broth, soups with the fat removed, homemade cream soups made with skim or low-fat milk. Low-fat spaghetti, lasagna, chili, and Spanish rice if low-fat ingredients and low-fat cooking techniques are used.  Avoid: Cream soups made with whole milk, cream, or high-fat cheese. All other soups. Desserts and Sweets  Allowed: Sherbet, fruit ices, gelatins, meringues, and angel food cake. Homemade desserts with recommended fats, oils, and milk products. Jam, jelly, honey, marmalade, sugars, and syrups. Pure sugar candy, such as gum drops, hard candy, jelly beans, marshmallows, mints, and small amounts of dark chocolate.  Avoid: Commercially prepared cakes, pies, cookies, frosting, pudding, or mixes for these products. Desserts containing whole milk products, chocolate, coconut, lard, palm oil, or palm kernel oil. Ice cream or ice cream drinks. Candy that contains chocolate, coconut, butter, hydrogenated fat, or unknown ingredients. Buttered syrups. Fats and  Oils  Allowed: Vegetable oils: safflower, sunflower, corn, soybean, cottonseed, sesame, canola, olive, or peanut. Non-hydrogenated margarines. Salad dressing or mayonnaise: homemade or commercial, made with a recommended oil. Low or nonfat salad dressing or mayonnaise.  Limit added fats and oils to 6 to 8 tsp per day (includes fats used in cooking, baking, salads, and spreads on bread). Remember to count the "hidden fats" in foods.  Avoid: Solid fats and shortenings: butter, lard, salt pork, bacon drippings. Gravy containing meat fat, shortening, or suet. Cocoa butter, coconut. Coconut oil, palm oil, palm kernel oil, or hydrogenated oils: these ingredients are often used in bakery products, nondairy creamers, whipped toppings, candy, and commercially fried foods. Read labels carefully. Salad dressings made of unknown oils, sour cream, or cheese, such as blue cheese and Roquefort. Cream, all kinds: half-and-half, light, heavy, or whipping. Sour cream or cream cheese (even if "light" or low-fat). Nondairy cream substitutes: coffee creamers and sour cream substitutes made with palm, palm kernel, hydrogenated oils, or coconut  oil. Beverages  Allowed: Coffee (regular or decaffeinated), tea. Diet carbonated beverages, mineral water. Alcohol: Check with your caregiver. Moderation is recommended.  Avoid: Whole milk, regular sodas, and juice drinks with added sugar. Condiments  Allowed: All seasonings and condiments. Cocoa powder. "Cream" sauces made with recommended ingredients.  Avoid: Carob powder made with hydrogenated fats. SAMPLE MENU Breakfast   cup orange juice   cup oatmeal  1 slice toast  1 tsp margarine  1 cup skim milk Lunch  [place] sandwich with 2 oz [place], 2 slices bread  Lettuce and tomato slices  Fresh fruit  Carrot sticks  Coffee or tea Snack  Fresh fruit or low-fat crackers Dinner  3 oz lean ground beef  1 baked potato  1 tsp margarine   cup  asparagus  Lettuce salad  1 tbs non-creamy dressing   cup peach slices  1 cup skim milk Document Released: 12/13/2007 Document Revised: 09/04/2011 Document Reviewed: 05/05/2013 Exit[employer] Patient Information 2015 [place], [place]. This information is not intended to replace advice given to you by your health care provider. Make sure you discuss any questions you have with your health care provider.

## 2014-02-08 ENCOUNTER — Telehealth: Payer: Self-pay | Admitting: Internal Medicine

## 2014-02-08 NOTE — Telephone Encounter (Signed)
PT TRYING TO GET HIS [employer] TRANSMITTER SET UP, COMPANY SAID HE IS NOT SET UP [place], [place]

## 2014-02-08 NOTE — Telephone Encounter (Signed)
Spoke with pt and informed him that I was working on this issue and I would be in contact w/ him. He verbalized understanding.

## 2014-02-09 NOTE — Telephone Encounter (Signed)
Spoke with pt and informed him that issue was resolved. He verbalized understanding. Walked pt through manual transmission. After several attempts and with no success I instructed pt to call tech services to receive help with monitor.

## 2014-02-15 DIAGNOSIS — E785 Hyperlipidemia, unspecified: Secondary | ICD-10-CM | POA: Diagnosis not present

## 2014-02-15 DIAGNOSIS — E291 Testicular hypofunction: Secondary | ICD-10-CM | POA: Diagnosis not present

## 2014-02-15 DIAGNOSIS — Z0000 Encounter for general adult medical examination without abnormal findings: Secondary | ICD-10-CM | POA: Diagnosis not present

## 2014-02-15 DIAGNOSIS — Z23 Encounter for immunization: Secondary | ICD-10-CM | POA: Diagnosis not present

## 2014-02-15 DIAGNOSIS — I4891 Unspecified atrial fibrillation: Secondary | ICD-10-CM | POA: Diagnosis not present

## 2014-02-15 DIAGNOSIS — I10 Essential (primary) hypertension: Secondary | ICD-10-CM | POA: Diagnosis not present

## 2014-02-15 DIAGNOSIS — E119 Type 2 diabetes mellitus without complications: Secondary | ICD-10-CM | POA: Diagnosis not present

## 2014-02-15 DIAGNOSIS — I429 Cardiomyopathy, unspecified: Secondary | ICD-10-CM | POA: Diagnosis not present

## 2014-02-17 ENCOUNTER — Telehealth (HOSPITAL_COMMUNITY): Payer: Self-pay | Admitting: Cardiology

## 2014-02-17 ENCOUNTER — Ambulatory Visit (INDEPENDENT_AMBULATORY_CARE_PROVIDER_SITE_OTHER): Payer: Medicare Other | Admitting: *Deleted

## 2014-02-17 ENCOUNTER — Encounter (HOSPITAL_COMMUNITY): Payer: Self-pay

## 2014-02-17 ENCOUNTER — Ambulatory Visit (HOSPITAL_COMMUNITY)
Admission: RE | Admit: 2014-02-17 | Discharge: 2014-02-17 | Disposition: A | Discharge status: Home / Self Care | Payer: Medicare Other | Source: Ambulatory Visit | Attending: Cardiology | Admitting: Cardiology

## 2014-02-17 VITALS — BP 120/60 | HR 72 | Wt 236.4 lb

## 2014-02-17 DIAGNOSIS — I519 Heart disease, unspecified: Secondary | ICD-10-CM

## 2014-02-17 DIAGNOSIS — I480 Paroxysmal atrial fibrillation: Secondary | ICD-10-CM | POA: Diagnosis present

## 2014-02-17 DIAGNOSIS — E119 Type 2 diabetes mellitus without complications: Secondary | ICD-10-CM | POA: Diagnosis not present

## 2014-02-17 DIAGNOSIS — I2510 Atherosclerotic heart disease of native coronary artery without angina pectoris: Secondary | ICD-10-CM | POA: Insufficient documentation

## 2014-02-17 DIAGNOSIS — Z7901 Long term (current) use of anticoagulants: Secondary | ICD-10-CM | POA: Diagnosis not present

## 2014-02-17 DIAGNOSIS — K219 Gastro-esophageal reflux disease without esophagitis: Secondary | ICD-10-CM | POA: Diagnosis not present

## 2014-02-17 DIAGNOSIS — J61 Pneumoconiosis due to asbestos and other mineral fibers: Secondary | ICD-10-CM | POA: Insufficient documentation

## 2014-02-17 DIAGNOSIS — I447 Left bundle-branch block, unspecified: Secondary | ICD-10-CM | POA: Insufficient documentation

## 2014-02-17 DIAGNOSIS — I429 Cardiomyopathy, unspecified: Secondary | ICD-10-CM | POA: Insufficient documentation

## 2014-02-17 DIAGNOSIS — I428 Other cardiomyopathies: Secondary | ICD-10-CM

## 2014-02-17 DIAGNOSIS — E785 Hyperlipidemia, unspecified: Secondary | ICD-10-CM | POA: Diagnosis not present

## 2014-02-17 DIAGNOSIS — D6851 Activated protein C resistance: Secondary | ICD-10-CM | POA: Diagnosis not present

## 2014-02-17 DIAGNOSIS — Z79899 Other long term (current) drug therapy: Secondary | ICD-10-CM | POA: Diagnosis not present

## 2014-02-17 DIAGNOSIS — I10 Essential (primary) hypertension: Secondary | ICD-10-CM | POA: Insufficient documentation

## 2014-02-17 DIAGNOSIS — G2581 Restless legs syndrome: Secondary | ICD-10-CM | POA: Diagnosis not present

## 2014-02-17 LAB — MDC_IDC_ENUM_SESS_TYPE_INCLINIC
Brady Statistic RA Percent Paced: 7.7 %
Brady Statistic RV Percent Paced: 99.15 %
HighPow Impedance: 67.5 Ohm
Implantable Pulse Generator Serial Number: 7211486
Lead Channel Impedance Value: 462.5 Ohm
Lead Channel Impedance Value: 487.5 Ohm
Lead Channel Pacing Threshold Amplitude: 0.5 V
Lead Channel Pacing Threshold Amplitude: 0.5 V
Lead Channel Pacing Threshold Amplitude: 0.75 V
Lead Channel Pacing Threshold Amplitude: 0.75 V
Lead Channel Pacing Threshold Pulse Width: 0.5 ms
Lead Channel Pacing Threshold Pulse Width: 0.5 ms
Lead Channel Pacing Threshold Pulse Width: 0.5 ms
Lead Channel Pacing Threshold Pulse Width: 0.5 ms
Lead Channel Sensing Intrinsic Amplitude: 3.1 mV
Lead Channel Setting Pacing Amplitude: 3.5 V
Lead Channel Setting Pacing Amplitude: 3.5 V
Lead Channel Setting Pacing Amplitude: 3.5 V
Lead Channel Setting Pacing Pulse Width: 0.5 ms
Lead Channel Setting Sensing Sensitivity: 0.5 mV
MDC IDC MSMT BATTERY REMAINING LONGEVITY: 50.4 mo
MDC IDC MSMT LEADCHNL LV PACING THRESHOLD PULSEWIDTH: 0.5 ms
MDC IDC MSMT LEADCHNL LV PACING THRESHOLD PULSEWIDTH: 0.5 ms
MDC IDC MSMT LEADCHNL RA PACING THRESHOLD AMPLITUDE: 0.5 V
MDC IDC MSMT LEADCHNL RV IMPEDANCE VALUE: 475 Ohm
MDC IDC MSMT LEADCHNL RV PACING THRESHOLD AMPLITUDE: 0.5 V
MDC IDC MSMT LEADCHNL RV SENSING INTR AMPL: 12 mV
MDC IDC SESS DTM: 20151202123757
MDC IDC SET LEADCHNL LV PACING PULSEWIDTH: 0.5 ms
MDC IDC SET ZONE DETECTION INTERVAL: 280 ms
Zone Setting Detection Interval: 250 ms
Zone Setting Detection Interval: 330 ms

## 2014-02-17 MED ORDER — CARVEDILOL 6.25 MG PO TABS: 6.2500 mg | ORAL_TABLET | Freq: Two times a day (BID) | ORAL | Status: DC

## 2014-02-17 MED ORDER — SPIRONOLACTONE 25 MG PO TABS: 25.0000 mg | ORAL_TABLET | Freq: Every day | ORAL | Status: DC

## 2014-02-17 MED ORDER — DIGOXIN 125 MCG PO TABS: 0.1250 mg | ORAL_TABLET | Freq: Every morning | ORAL | Status: DC

## 2014-02-17 MED ORDER — LISINOPRIL 10 MG PO TABS: 10.0000 mg | ORAL_TABLET | Freq: Every evening | ORAL | Status: DC

## 2014-02-17 MED ORDER — AMIODARONE HCL 200 MG PO TABS: 200.0000 mg | ORAL_TABLET | Freq: Every day | ORAL | Status: DC

## 2014-02-17 NOTE — Progress Notes (Signed)
Patient ID: Walter [name], male   DOB: [birth year], 68 y.o.   MRN: [id number] PCP: Dr. Orland Mustard  68 yo with history of paroxysmal atrial fibrillation, chronic LBBB and cardiomyopathy presents for cardiology evaluation.  He was found to have LBBB 15-20 years ago.  In 2011, he had a stress test in [facility] that was abnormal so he was taken for [facility] in 6/11 that showed nonobstructive disease.  He had an echo in 5/11 that showed EF 45-50%, diffuse hypokinesis suggesting a mild nonischemic cardiomyopathy.   He has admitted in 7/15 with atrial fibrillation with RVR.  Rate was very difficult to control.  TEE failed due to inability to pass probe x 2.  Eventually, ICE was done to rule out LAA thrombus and he was cardioverted to NSR.  He is on amiodarone now and in NSR.  Echo in 7/15 showed EF 20% with diffuse hypokinesis in setting of atrial fibrillation/RVR.  LHC was also done in 7/15 with minimal coronary disease.  Repeat echo in 10/15 showed EF 20-25%.  In 11/15, he had St Jude CRT-D device placed .  He is now on Eliquis with no BRBPR or melena.  He feels good, no exertional dyspnea.  He can walk up steps without problems.  No chest pain.  No orthopnea or PND.  He takes Lasix rarely, took a dose after Thanksgiving.  Weight is stable.    Labs (11/14): K 3.9, creatinine 1.14, LDL 104, LFTs normal Labs (2/15): HCT 56.3 Labs (9/15): K 4.1, creatinine 1.1, digoxin 0.9, TSH normal, LFTs normal Labs (10/15): TGs 262, LDL 82, BNP 68 Labs (11/15): K 4.1, creatinine 1.12  PMH: 1. Type II diabetes 2. HTN 3. Adrenal nodule 4. Restless leg syndrome 5. OA with left hip pain, right frozen shoulder, low back pain.  6. Testicular cancer: 1972 7. Chronic LBBB: Had Cardiolite in 2011 that was abnormal so had LHC in 6/11 in [place], [place].  This showed 30% LCx stenosis and 40% ostial RCA stenosis.  LHC in 7/15 in [place] showed minimal coronary disease.  8. Cardiomyopathy: Nonischemic cardiomyopathy, ?LBBB  cardiomyopathy initially, now possibly tachycardia-mediated.  Echo (5/11) with EF 45-50%, moderate LVH.  Echo (7/15) with EF 20%, diffuse hypokinesis, mild to moderately decreased RV systolic function (in setting of afib with RVR). LHC (7/15) with minimal coronary disease.  Echo (10/15) with EF 20-25%, mild LVH, moderate LAE. St Jude CRT-D device placed in 11/15.  9. Factor V Leiden + 10. Asbestosis: Mild.  Exposure while in the [employer].  11. GERD 12. PVCs 13. Polycythemia: Uncertain etiology 14. Hyperlipidemia: Myalgias with Zocor and Lipitor.  15. Atrial fibrillation: Paroxysmal.  Unable to pass TEE probe.   SH: Retired from [employer], lives with wife in [place].  2 daughters.  Occasional ETOH.  Quit smoking > 40 years ago.   FH: No premature CAD  ROS: All systems reviewed and negative except as per HPI.    Current Outpatient Prescriptions  Medication Sig Dispense Refill  . albuterol (PROVENTIL HFA;VENTOLIN HFA) 108 (90 BASE) MCG/ACT inhaler Inhale 2 puffs into the lungs as needed for wheezing or shortness of breath.    Marland Kitchen amiodarone (PACERONE) 200 MG tablet Take 1 tablet (200 mg total) by mouth daily. 90 tablet 3  . apixaban (ELIQUIS) 5 MG TABS tablet Take 1 tablet (5 mg total) by mouth 2 (two) times daily. 180 tablet 3  . cholecalciferol (VITAMIN D) 1000 UNITS tablet Take 1,000 Units by mouth every morning.     Marland Kitchen  digoxin (LANOXIN) 0.125 MG tablet Take 1 tablet (0.125 mg total) by mouth every morning. 90 tablet 3  . esomeprazole (NEXIUM) 40 MG capsule Take 40 mg by mouth daily at 12 noon.    . furosemide (LASIX) 40 MG tablet Take 40 mg by mouth daily as needed. For weight gain, swelling, shortness of breath, etc.    . HYDROcodone-acetaminophen (NORCO/VICODIN) 5-325 MG per tablet Take 2 tablets by mouth every 6 (six) hours as needed for moderate pain.    Marland Kitchen lisinopril (PRINIVIL,ZESTRIL) 10 MG tablet Take 1 tablet (10 mg total) by mouth every evening. 90 tablet 3  . lovastatin  (ALTOPREV) 40 MG 24 hr tablet Take 40 mg by mouth at bedtime.    . magnesium oxide (MAG-OX) 400 MG tablet Take 400 mg by mouth every morning.     . metFORMIN (GLUCOPHAGE) 1000 MG tablet Take 1,000 mg by mouth 2 (two) times daily with a meal.    . omega-3 acid ethyl esters (LOVAZA) 1 G capsule Take 1 g by mouth 2 (two) times daily.    . potassium chloride SA (K-DUR,KLOR-CON) 20 MEQ tablet Take 20 mEq by mouth daily as needed (for low potassium). Take when taking furosemide    . rOPINIRole (REQUIP) 0.5 MG tablet Take 0.5 mg by mouth daily as needed (RLS).     Marland Kitchen rOPINIRole (REQUIP) 1 MG tablet Take 1 mg by mouth at bedtime.    . sildenafil (VIAGRA) 100 MG tablet Take 100 mg by mouth daily as needed for erectile dysfunction.    Marland Kitchen spironolactone (ALDACTONE) 25 MG tablet Take 1 tablet (25 mg total) by mouth daily. Please cancel all previous orders for current medication. Change in dosage or pill size. 90 tablet 3  . testosterone cypionate (DEPO-TESTOSTERONE) 200 MG/ML injection Inject 200 mg into the muscle every Thursday.     Marland Kitchen tiZANidine (ZANAFLEX) 4 MG capsule Take 4 mg by mouth as needed for muscle spasms.    . carvedilol (COREG) 6.25 MG tablet Take 1 tablet (6.25 mg total) by mouth 2 (two) times daily. 180 tablet 3   No current facility-administered medications for this encounter.   BP 120/60 mmHg  Pulse 72  Wt 236 lb 6.4 oz (107.23 kg)  SpO2 94% General: NAD, overweight Neck: No JVD, no thyromegaly or thyroid nodule.  Lungs: Clear to auscultation bilaterally with normal respiratory effort. CV: Nondisplaced PMI.  Heart regular S1/S2, no S3/S4, no murmur.  No peripheral edema.  No carotid bruit.  Normal pedal pulses.  Abdomen: Soft, nontender, no hepatosplenomegaly, no distention.  Skin: Intact without lesions or rashes.  Neurologic: Alert and oriented x 3.  Psych: Normal affect. Extremities: No clubbing or cyanosis.    Assessment/Plan: 1. Cardiomyopathy: Nonischemic.  There was a  pre-existing possible LBBB cardiomyopathy, but EF was down to 20% in the setting of atrial fibrillation with RVR in 7/15 (suggesting tachycardia-mediated cardiomyopathy).  However, EF did not improve in NSR (20-25% on repeat echo in 10/15).  Now with St Jude CRT-D device.  No significant coronary disease on 7/15 LHC.   NYHA class I-II symptoms without volume overload.  - Continue current digoxin and lisinopril. - Stop Toprol XL and start Coreg 6.25 mg bid.  - Increase spironolactone to 25 mg daily.   - Repeat echo in 6 months to look for EF recovery with CRT.  - May continue to use Lasix prn.  - BMET/digoxin level in 10 days.  2. CAD: Nonobstructive, mild.  Continue statin.  No aspirin  as he is anticoagulated.  3. Hyperlipidemia: Continue lovastatin.  Myalgias with other statins. Watch diet with high triglycerides.  4. HTN: BP is controlled.  5. Atrial fibrillation: Paroxysmal, now in NSR on amiodarone 200 mg daily.  Needs to maintain NSR as possible component of tachy-mediated cardiomyopathy.   - Continue current Eliquis. - LFTs/TSH with labs in 10 days.  Will need yearly eye exam.   Followup in 2 months in CHF clinic.   Loralie Champagne 02/17/2014

## 2014-02-17 NOTE — Progress Notes (Signed)

## 2014-02-17 NOTE — Telephone Encounter (Signed)
Called with concerns/question Regarding metformin usage and cardiomyopathy  Per Junie Bame, NP Cr- stable and pt stable during OV on 12/2 [facility] for pt to continue  Detailed message left for [facility]

## 2014-02-17 NOTE — Patient Instructions (Signed)
Increase Spironolactone to 25 mg daily  Stop Metoprolol (Toprol)  Start Carvedilol 6.25 mg Twice daily   Labs in about 10 days (cmet, tsh, dig level)   Your physician recommends that you schedule a follow-up appointment in: 2 months

## 2014-02-25 ENCOUNTER — Encounter (HOSPITAL_COMMUNITY): Payer: Self-pay | Admitting: Cardiovascular Disease

## 2014-02-26 ENCOUNTER — Encounter: Payer: Self-pay | Admitting: Internal Medicine

## 2014-03-02 ENCOUNTER — Ambulatory Visit (HOSPITAL_COMMUNITY)
Admission: RE | Admit: 2014-03-02 | Discharge: 2014-03-02 | Disposition: A | Discharge status: Home / Self Care | Payer: Medicare Other | Source: Ambulatory Visit | Attending: Internal Medicine | Admitting: Internal Medicine

## 2014-03-02 DIAGNOSIS — I5022 Chronic systolic (congestive) heart failure: Secondary | ICD-10-CM

## 2014-03-02 DIAGNOSIS — I519 Heart disease, unspecified: Secondary | ICD-10-CM

## 2014-03-02 LAB — BASIC METABOLIC PANEL
Anion gap: 11 (ref 5–15)
BUN: 21 mg/dL (ref 6–23)
CALCIUM: 9.3 mg/dL (ref 8.4–10.5)
CO2: 26 meq/L (ref 19–32)
Chloride: 100 mEq/L (ref 96–112)
Creatinine, Ser: 1.02 mg/dL (ref 0.50–1.35)
GFR calc Af Amer: 84 mL/min — ABNORMAL LOW (ref >90)
GFR calc non Af Amer: 72 mL/min — ABNORMAL LOW (ref >90)
GLUCOSE: 117 mg/dL — AB (ref 70–99)
Potassium: 4.8 mEq/L (ref 3.7–5.3)
SODIUM: 137 meq/L (ref 137–147)

## 2014-03-03 ENCOUNTER — Telehealth: Payer: Self-pay | Admitting: Cardiology

## 2014-03-03 ENCOUNTER — Telehealth: Payer: Self-pay | Admitting: Internal Medicine

## 2014-03-03 DIAGNOSIS — M2550 Pain in unspecified joint: Secondary | ICD-10-CM | POA: Diagnosis not present

## 2014-03-03 DIAGNOSIS — M791 Myalgia: Secondary | ICD-10-CM | POA: Diagnosis not present

## 2014-03-03 DIAGNOSIS — Z79899 Other long term (current) drug therapy: Secondary | ICD-10-CM | POA: Diagnosis not present

## 2014-03-03 DIAGNOSIS — Z79891 Long term (current) use of opiate analgesic: Secondary | ICD-10-CM | POA: Diagnosis not present

## 2014-03-03 DIAGNOSIS — M47817 Spondylosis without myelopathy or radiculopathy, lumbosacral region: Secondary | ICD-10-CM | POA: Diagnosis not present

## 2014-03-03 DIAGNOSIS — G894 Chronic pain syndrome: Secondary | ICD-10-CM | POA: Diagnosis not present

## 2014-03-03 DIAGNOSIS — M19019 Primary osteoarthritis, unspecified shoulder: Secondary | ICD-10-CM | POA: Diagnosis not present

## 2014-03-03 DIAGNOSIS — M353 Polymyalgia rheumatica: Secondary | ICD-10-CM | POA: Diagnosis not present

## 2014-03-03 DIAGNOSIS — M1990 Unspecified osteoarthritis, unspecified site: Secondary | ICD-10-CM | POA: Diagnosis not present

## 2014-03-03 NOTE — Telephone Encounter (Signed)
New message     Pt has a ICD and he needs a radio frequency ablation on 03-17-14.   A st jude rep will be there for the procedure but he needs to be cleared by [name].  Please fax clearance to [phone].

## 2014-03-03 NOTE — Telephone Encounter (Signed)
Pt's back pain management doctor, Dr. Andree Elk, would prefer to speak directly w/ Dr. Rayann Heman regarding RF lumbar stimulation. Dr. Andree Elk' office does not have a device magnet. I sent a note to Dr. Rayann Heman w/ Dr. Andree Elk' #.

## 2014-03-03 NOTE — Telephone Encounter (Signed)
Discussed with [name], he would prefer the patient wait until he 3 month post hospital visit scheduled for 04/26/14.  If this is not possible okay to proceed.

## 2014-03-03 NOTE — Telephone Encounter (Signed)
Spoke with Brittney and the patient is having a RFA of the lumbar facet nerves.. Had an ICD placed on 02/04/14  They have spoken with a [employer] Jude rep who will be there in the case.  Spoke with Dr. Andree Elk will fax note to [phone].  Dr Andree Elk just wants to make sure it's not to soon to have this procedure done s/p ICD

## 2014-03-17 DIAGNOSIS — M5137 Other intervertebral disc degeneration, lumbosacral region: Secondary | ICD-10-CM | POA: Diagnosis not present

## 2014-03-31 DIAGNOSIS — G894 Chronic pain syndrome: Secondary | ICD-10-CM | POA: Diagnosis not present

## 2014-03-31 DIAGNOSIS — M488X6 Other specified spondylopathies, lumbar region: Secondary | ICD-10-CM | POA: Diagnosis not present

## 2014-03-31 DIAGNOSIS — M5137 Other intervertebral disc degeneration, lumbosacral region: Secondary | ICD-10-CM | POA: Diagnosis not present

## 2014-03-31 DIAGNOSIS — M47817 Spondylosis without myelopathy or radiculopathy, lumbosacral region: Secondary | ICD-10-CM | POA: Diagnosis not present

## 2014-04-14 DIAGNOSIS — M25551 Pain in right hip: Secondary | ICD-10-CM | POA: Diagnosis not present

## 2014-04-23 DIAGNOSIS — E119 Type 2 diabetes mellitus without complications: Secondary | ICD-10-CM | POA: Diagnosis not present

## 2014-04-26 ENCOUNTER — Encounter: Payer: Self-pay | Admitting: Internal Medicine

## 2014-04-26 ENCOUNTER — Ambulatory Visit (INDEPENDENT_AMBULATORY_CARE_PROVIDER_SITE_OTHER): Payer: Medicare Other | Admitting: Internal Medicine

## 2014-04-26 VITALS — BP 134/76 | HR 75 | Ht 72.0 in | Wt 241.0 lb

## 2014-04-26 DIAGNOSIS — I481 Persistent atrial fibrillation: Secondary | ICD-10-CM

## 2014-04-26 DIAGNOSIS — I429 Cardiomyopathy, unspecified: Secondary | ICD-10-CM | POA: Diagnosis not present

## 2014-04-26 DIAGNOSIS — I4819 Other persistent atrial fibrillation: Secondary | ICD-10-CM

## 2014-04-26 DIAGNOSIS — I428 Other cardiomyopathies: Secondary | ICD-10-CM

## 2014-04-26 DIAGNOSIS — I519 Heart disease, unspecified: Secondary | ICD-10-CM | POA: Diagnosis not present

## 2014-04-26 DIAGNOSIS — I10 Essential (primary) hypertension: Secondary | ICD-10-CM

## 2014-04-26 NOTE — Progress Notes (Signed)
Electrophysiology Office Note   Date:  04/26/2014   ID:  Walter [name], DOB 28-Oct-1945, MRN [id number]  PCP:  London Pepper, MD  Cardiologist:  Dr Aundra Dubin Primary Electrophysiologist: Thompson Grayer, MD    Chief Complaint  Patient presents with  . Follow-up    PAF & NICM     History of Present Illness: Walter [name] is a 69 y.o. male who presents today for electrophysiology evaluation.   Since his recent BiV ICD implant, he has done well.  He denies procedure related complications and is pleased with his results. He has not been as active due to the cold weather.   Today, he denies symptoms of palpitations, chest pain, shortness of breath, orthopnea, PND, lower extremity edema, claudication, dizziness, presyncope, syncope, bleeding, or neurologic sequela. The patient is tolerating medications without difficulties and is otherwise without complaint today.    Past Medical History  Diagnosis Date  . Essential hypertension   . DJD (degenerative joint disease)     /osteoarthritis  . Frozen shoulder     right  . Chronic left hip pain   . Chronic low back pain     /sciatica- Dr Letta Median [facility])  . Asbestosis     mild  . GERD (gastroesophageal reflux disease)   . Hypogonadism male   . Coronary artery disease, non-occlusive June 2011    Cardiac cath in [facility] following abnormal Myoview  . Left bundle branch block (LBBB) on electrocardiogram     Diagnosed close to 20 years ago  . Obesity   . Factor V Leiden   . Testicular cancer 1972    left  . Type 2 diabetes mellitus without complication 11/5186    Type 2.   . Non-ischemic cardiomyopathy June 2011    EF 45-50%; suspect related to LBBB; repeat Echo March 2015: EF 50- 55%  . Dyslipidemia, goal LDL below 100     On lovastatin (myalgias with Zocor and Lipitor)  . Atrial fibrillation    Past Surgical History  Procedure Laterality Date  . Lymph nodes    . Tonsillectomy Bilateral   . Surgery scrotal /  testicular Left   . Appendectomy N/A   . Rotator cuff repair Right   . Cholecystectomy open    . Hernia repair    . Knee arthroscopy Left   . Cardiac catheterization  June 2011    Nonobstructive CAD  . Transthoracic echocardiogram  May 2011    EF 45% with diffuse hypokinesis; septal bounce from LBBB  . Transthoracic echocardiogram  March 2015    EF 50-55%. Mild concentric LVH. Septal dyssynergy from LBBB   . Cardioversion N/A 10/13/2013    Procedure: CARDIOVERSION;  Surgeon: Larey Dresser, MD;  Location: [place];  Service: Cardiovascular;  Laterality: N/A;  . Esophagogastroduodenoscopy N/A 10/15/2013    Procedure: ESOPHAGOGASTRODUODENOSCOPY (EGD);  Surgeon: Gatha Mayer, MD;  Location: [facility] ENDOSCOPY;  Service: Endoscopy;  Laterality: N/A;  bedside  . Cardioversion  10-16-2013    DCCV by [name] following intracardiac echo to rule out LAA thrombus  . Cardiac defibrillator placement  02/04/14    [facility] model [place] 3365-40Q (serial Number [id number]) biventricular ICD.  Marland Kitchen Left and right heart catheterization with coronary angiogram N/A 10/12/2013    Procedure: LEFT AND RIGHT HEART CATHETERIZATION WITH CORONARY ANGIOGRAM;  Surgeon: Troy Sine, MD;  Location: [facility] CATH LAB;  Service: Cardiovascular;  Laterality: N/A;  . Electrophysiology study N/A 10/16/2013  Procedure: ELECTROPHYSIOLOGY STUDY;  Surgeon: Coralyn Mark, MD;  Location: [facility] CATH LAB;  Service: Cardiovascular;  Laterality: N/A;  . Cardioversion N/A 10/16/2013    Procedure: CARDIOVERSION;  Surgeon: Coralyn Mark, MD;  Location: [place] CATH LAB;  Service: Cardiovascular;  Laterality: N/A;  . Bi-ventricular implantable cardioverter defibrillator N/A 02/04/2014    [facility] [employer] BiV ICD implanted for primary prevention by [name]     Current Outpatient Prescriptions  Medication Sig Dispense Refill  . albuterol (PROVENTIL HFA;VENTOLIN HFA) 108 (90 BASE) MCG/ACT inhaler Inhale 2 puffs into the lungs every 6  (six) hours as needed for wheezing or shortness of breath.     Marland Kitchen amiodarone (PACERONE) 200 MG tablet Take 1 tablet (200 mg total) by mouth daily. 90 tablet 3  . apixaban (ELIQUIS) 5 MG TABS tablet Take 1 tablet (5 mg total) by mouth 2 (two) times daily. 180 tablet 3  . carvedilol (COREG) 6.25 MG tablet Take 1 tablet (6.25 mg total) by mouth 2 (two) times daily. 180 tablet 3  . cholecalciferol (VITAMIN D) 1000 UNITS tablet Take 1,000 Units by mouth every morning.     . digoxin (LANOXIN) 0.125 MG tablet Take 1 tablet (0.125 mg total) by mouth every morning. 90 tablet 3  . esomeprazole (NEXIUM) 40 MG capsule Take 40 mg by mouth daily at 12 noon.    . furosemide (LASIX) 40 MG tablet Take 40 mg by mouth daily as needed. For weight gain, swelling, shortness of breath, etc.    . HYDROcodone-acetaminophen (NORCO) 10-325 MG per tablet Take 1 tablet by mouth every 6 (six) hours as needed. pain  0  . lisinopril (PRINIVIL,ZESTRIL) 10 MG tablet Take 1 tablet (10 mg total) by mouth every evening. 90 tablet 3  . lovastatin (ALTOPREV) 40 MG 24 hr tablet Take 40 mg by mouth at bedtime.    . magnesium oxide (MAG-OX) 400 MG tablet Take 400 mg by mouth every morning.     . metFORMIN (GLUCOPHAGE) 1000 MG tablet Take 1,000 mg by mouth 2 (two) times daily with a meal.    . omega-3 acid ethyl esters (LOVAZA) 1 G capsule Take 1 g by mouth 2 (two) times daily.    Marland Kitchen oxyCODONE-acetaminophen (PERCOCET/ROXICET) 5-325 MG per tablet Take 1 tablet by mouth every 6 (six) hours as needed. Back pain  0  . potassium chloride SA (K-DUR,KLOR-CON) 20 MEQ tablet Take 20 mEq by mouth daily as needed (for low potassium). Take when taking furosemide    . rOPINIRole (REQUIP) 0.5 MG tablet Take 0.5 mg by mouth daily as needed (RLS).     Marland Kitchen rOPINIRole (REQUIP) 1 MG tablet Take 1 mg by mouth at bedtime. (Pt also takes one 0.5 mg tablet at night as needed for RLS)    . sildenafil (VIAGRA) 100 MG tablet Take 100 mg by mouth daily as needed for  erectile dysfunction.    Marland Kitchen spironolactone (ALDACTONE) 25 MG tablet Take 1 tablet (25 mg total) by mouth daily. Please cancel all previous orders for current medication. Change in dosage or pill size. 90 tablet 3  . testosterone cypionate (DEPO-TESTOSTERONE) 200 MG/ML injection Inject 200 mg into the muscle every Thursday.     Marland Kitchen tiZANidine (ZANAFLEX) 4 MG capsule Take 4 mg by mouth 2 (two) times daily as needed for muscle spasms.      No current facility-administered medications for this visit.    Allergies:   Statins   Social History:  The patient  reports that  he has quit smoking. His smoking use included Cigarettes. He does not have any smokeless tobacco history on file. He reports that he drinks alcohol. He reports that he does not use illicit drugs.   Family History:  The patient's family history includes Heart attack in his maternal uncle; Heart disease in his father, mother, and paternal uncle; Hypertension in his brother, brother, and mother.    ROS:  Please see the history of present illness.   All other systems are reviewed and negative.    PHYSICAL EXAM: VS:  BP 134/76 mmHg  Pulse 75  Ht 6' (1.829 m)  Wt 241 lb (109.317 kg)  BMI 32.68 kg/m2 , BMI Body mass index is 32.68 kg/(m^2). GEN: Well nourished, well developed, in no acute distress HEENT: normal Neck: no JVD, carotid bruits, or masses Cardiac: RRR; no murmurs, rubs, or gallops,no edema  Respiratory:  clear to auscultation bilaterally, normal work of breathing GI: soft, nontender, nondistended, + BS MS: no deformity or atrophy Skin: warm and dry, device pocket is well healed Neuro:  Strength and sensation are intact Psych: euthymic mood, full affect  EKG:  EKG is ordered today. The ekg ordered today shows sinus rhythm with BiV pacing  Device interrogation is reviewed today in detail.  See PaceArt for details.   Recent Labs: 10/12/2013: Magnesium 1.6 11/25/2013: ALT 29; TSH 1.36 12/21/2013: Pro B Natriuretic  peptide (BNP) 68.0 01/27/2014: Hemoglobin 14.9; Platelets 227.0 03/02/2014: BUN 21; Creatinine 1.02; Potassium 4.8; Sodium 137    Lipid Panel     Component Value Date/Time   CHOL 164 12/21/2013 0946   TRIG 262.0* 12/21/2013 0946   HDL 35.30* 12/21/2013 0946   CHOLHDL 5 12/21/2013 0946   VLDL 52.4* 12/21/2013 0946   LDLDIRECT 81.7 12/21/2013 0946     Wt Readings from Last 3 Encounters:  04/26/14 241 lb (109.317 kg)  02/05/14 232 lb 6.4 oz (105.416 kg)  01/15/14 234 lb (106.142 kg)     ASSESSMENT AND PLAN:  1.  Chronic systolic dysfunction Doing well with CRT Narrow QRS with pacing Normal BiV ICD interrogation today Would repeat echo 6 months s/p CRT  2. afib Well controlled with amiodarone Dr Aundra Dubin to follow LFTs/TFTs Continue eliquis  3. HTN Stable No change required today   Continue merlin Return to see me in 1 year    Current medicines are reviewed at length with the patient today.   The patient does not have concerns regarding his medicines.  The following changes were made today:  none  Labs/ tests ordered today include:  Orders Placed This Encounter  Procedures  . EKG 12-Lead    Signed, Thompson Grayer, MD  04/26/2014 9:59 AM     [facility] [address] [place] [address] [phone] (office) [phone] (fax)

## 2014-04-26 NOTE — Patient Instructions (Signed)
Remote monitoring is used to monitor your ICD from home. This monitoring reduces the number of office visits required to check your device to one time per year. It allows [place] to keep an eye on the functioning of your device to ensure it is working properly. You are scheduled for a device check from home on 07-26-2014. You may send your transmission at any time that day. If you have a wireless device, the transmission will be sent automatically. After your physician reviews your transmission, you will receive a postcard with your next transmission date.  Your physician recommends that you schedule a follow-up appointment in: November with Dr.Allred

## 2014-04-27 LAB — MDC_IDC_ENUM_SESS_TYPE_INCLINIC
Battery Remaining Longevity: 79.2 mo
Brady Statistic RA Percent Paced: 16 %
Brady Statistic RV Percent Paced: 99.41 %
HighPow Impedance: 74.25 Ohm
Lead Channel Impedance Value: 450 Ohm
Lead Channel Impedance Value: 537.5 Ohm
Lead Channel Impedance Value: 575 Ohm
Lead Channel Pacing Threshold Amplitude: 0.5 V
Lead Channel Pacing Threshold Amplitude: 0.75 V
Lead Channel Pacing Threshold Amplitude: 0.75 V
Lead Channel Pacing Threshold Amplitude: 0.75 V
Lead Channel Pacing Threshold Pulse Width: 0.5 ms
Lead Channel Pacing Threshold Pulse Width: 0.5 ms
Lead Channel Pacing Threshold Pulse Width: 0.5 ms
Lead Channel Pacing Threshold Pulse Width: 0.5 ms
Lead Channel Sensing Intrinsic Amplitude: 2.1 mV
Lead Channel Setting Pacing Amplitude: 1.75 V
Lead Channel Setting Pacing Amplitude: 2 V
Lead Channel Setting Pacing Pulse Width: 0.5 ms
Lead Channel Setting Pacing Pulse Width: 0.5 ms
MDC IDC MSMT LEADCHNL LV PACING THRESHOLD AMPLITUDE: 0.75 V
MDC IDC MSMT LEADCHNL LV PACING THRESHOLD PULSEWIDTH: 0.5 ms
MDC IDC MSMT LEADCHNL RV PACING THRESHOLD AMPLITUDE: 0.5 V
MDC IDC MSMT LEADCHNL RV PACING THRESHOLD PULSEWIDTH: 0.5 ms
MDC IDC MSMT LEADCHNL RV SENSING INTR AMPL: 12 mV
MDC IDC PG SERIAL: 7211486
MDC IDC SESS DTM: 20160208143850
MDC IDC SET LEADCHNL RV PACING AMPLITUDE: 2.5 V
MDC IDC SET LEADCHNL RV SENSING SENSITIVITY: 0.5 mV
MDC IDC SET ZONE DETECTION INTERVAL: 330 ms
Zone Setting Detection Interval: 250 ms
Zone Setting Detection Interval: 280 ms

## 2014-04-28 DIAGNOSIS — M1990 Unspecified osteoarthritis, unspecified site: Secondary | ICD-10-CM | POA: Diagnosis not present

## 2014-04-28 DIAGNOSIS — M4697 Unspecified inflammatory spondylopathy, lumbosacral region: Secondary | ICD-10-CM | POA: Diagnosis not present

## 2014-04-28 DIAGNOSIS — Z79899 Other long term (current) drug therapy: Secondary | ICD-10-CM | POA: Diagnosis not present

## 2014-04-28 DIAGNOSIS — M545 Low back pain: Secondary | ICD-10-CM | POA: Diagnosis not present

## 2014-04-28 DIAGNOSIS — G894 Chronic pain syndrome: Secondary | ICD-10-CM | POA: Diagnosis not present

## 2014-05-13 ENCOUNTER — Telehealth: Payer: Self-pay | Admitting: Internal Medicine

## 2014-05-13 DIAGNOSIS — M47817 Spondylosis without myelopathy or radiculopathy, lumbosacral region: Secondary | ICD-10-CM | POA: Diagnosis not present

## 2014-05-13 DIAGNOSIS — M545 Low back pain: Secondary | ICD-10-CM | POA: Diagnosis not present

## 2014-05-13 DIAGNOSIS — M4697 Unspecified inflammatory spondylopathy, lumbosacral region: Secondary | ICD-10-CM | POA: Diagnosis not present

## 2014-05-13 DIAGNOSIS — G894 Chronic pain syndrome: Secondary | ICD-10-CM | POA: Diagnosis not present

## 2014-05-13 NOTE — Telephone Encounter (Signed)
New problem   Pt want to know if he can use a chainsaw and other equipment that has a motor. Please advise pt.

## 2014-05-14 NOTE — Telephone Encounter (Signed)
Informed patient that chainsaws are not typically recommended for patients with devices. Patient voiced understanding. I also gave him the [employer] number so he could ask them about the various types of saws he wants to use. I encouraged patient to call back if he has any further questions. Patient again voiced understanding.

## 2014-05-19 DIAGNOSIS — E119 Type 2 diabetes mellitus without complications: Secondary | ICD-10-CM | POA: Diagnosis not present

## 2014-05-19 DIAGNOSIS — I10 Essential (primary) hypertension: Secondary | ICD-10-CM | POA: Diagnosis not present

## 2014-05-19 DIAGNOSIS — I4891 Unspecified atrial fibrillation: Secondary | ICD-10-CM | POA: Diagnosis not present

## 2014-05-19 DIAGNOSIS — I429 Cardiomyopathy, unspecified: Secondary | ICD-10-CM | POA: Diagnosis not present

## 2014-05-19 DIAGNOSIS — E785 Hyperlipidemia, unspecified: Secondary | ICD-10-CM | POA: Diagnosis not present

## 2014-05-24 ENCOUNTER — Encounter (HOSPITAL_COMMUNITY): Payer: Self-pay

## 2014-05-24 ENCOUNTER — Ambulatory Visit (HOSPITAL_COMMUNITY)
Admission: RE | Admit: 2014-05-24 | Discharge: 2014-05-24 | Disposition: A | Discharge status: Home / Self Care | Payer: Medicare Other | Source: Ambulatory Visit | Attending: Cardiology | Admitting: Cardiology

## 2014-05-24 VITALS — BP 122/74 | HR 86 | Wt 241.8 lb

## 2014-05-24 DIAGNOSIS — I10 Essential (primary) hypertension: Secondary | ICD-10-CM | POA: Insufficient documentation

## 2014-05-24 DIAGNOSIS — Z79899 Other long term (current) drug therapy: Secondary | ICD-10-CM | POA: Insufficient documentation

## 2014-05-24 DIAGNOSIS — K219 Gastro-esophageal reflux disease without esophagitis: Secondary | ICD-10-CM | POA: Insufficient documentation

## 2014-05-24 DIAGNOSIS — I429 Cardiomyopathy, unspecified: Secondary | ICD-10-CM | POA: Insufficient documentation

## 2014-05-24 DIAGNOSIS — I480 Paroxysmal atrial fibrillation: Secondary | ICD-10-CM | POA: Diagnosis not present

## 2014-05-24 DIAGNOSIS — I2510 Atherosclerotic heart disease of native coronary artery without angina pectoris: Secondary | ICD-10-CM | POA: Diagnosis not present

## 2014-05-24 DIAGNOSIS — D751 Secondary polycythemia: Secondary | ICD-10-CM | POA: Diagnosis not present

## 2014-05-24 DIAGNOSIS — E785 Hyperlipidemia, unspecified: Secondary | ICD-10-CM | POA: Diagnosis not present

## 2014-05-24 DIAGNOSIS — G2581 Restless legs syndrome: Secondary | ICD-10-CM | POA: Diagnosis not present

## 2014-05-24 DIAGNOSIS — I519 Heart disease, unspecified: Secondary | ICD-10-CM | POA: Insufficient documentation

## 2014-05-24 DIAGNOSIS — Z7902 Long term (current) use of antithrombotics/antiplatelets: Secondary | ICD-10-CM | POA: Diagnosis not present

## 2014-05-24 DIAGNOSIS — E119 Type 2 diabetes mellitus without complications: Secondary | ICD-10-CM | POA: Insufficient documentation

## 2014-05-24 LAB — COMPREHENSIVE METABOLIC PANEL WITH GFR
ALT: 24 U/L (ref 0–53)
AST: 23 U/L (ref 0–37)
Albumin: 4 g/dL (ref 3.5–5.2)
Alkaline Phosphatase: 47 U/L (ref 39–117)
Anion gap: 5 (ref 5–15)
BUN: 20 mg/dL (ref 6–23)
CO2: 28 mmol/L (ref 19–32)
Calcium: 9 mg/dL (ref 8.4–10.5)
Chloride: 100 mmol/L (ref 96–112)
Creatinine, Ser: 1.06 mg/dL (ref 0.50–1.35)
GFR calc Af Amer: 80 mL/min — ABNORMAL LOW
GFR calc non Af Amer: 69 mL/min — ABNORMAL LOW
Glucose, Bld: 124 mg/dL — ABNORMAL HIGH (ref 70–99)
Potassium: 4.6 mmol/L (ref 3.5–5.1)
Sodium: 133 mmol/L — ABNORMAL LOW (ref 135–145)
Total Bilirubin: 0.8 mg/dL (ref 0.3–1.2)
Total Protein: 6.6 g/dL (ref 6.0–8.3)

## 2014-05-24 LAB — DIGOXIN LEVEL: Digoxin Level: 0.8 ng/mL (ref 0.8–2.0)

## 2014-05-24 LAB — TSH: TSH: 3.093 u[IU]/mL (ref 0.350–4.500)

## 2014-05-24 MED ORDER — CARVEDILOL 12.5 MG PO TABS: 12.5000 mg | ORAL_TABLET | Freq: Two times a day (BID) | ORAL | Status: DC

## 2014-05-24 NOTE — Patient Instructions (Signed)
INCREASE Carvedilol (Coreg) to 12.5mg  twice daily.  Return in April for echocardiogram.  Follow up 2 months.  Do the following things EVERYDAY: 1) Weigh yourself in the morning before breakfast. Write it down and keep it in a log. 2) Take your medicines as prescribed 3) Eat low salt foods-Limit salt (sodium) to 2000 mg per day.  4) Stay as active as you can everyday 5) Limit all fluids for the day to less than 2 liters

## 2014-05-24 NOTE — Progress Notes (Signed)
Patient ID: Walter [name], male   DOB: 04/04/1945, 69 y.o.   MRN: [id number] PCP: Dr. Orland Mustard  69 yo with history of paroxysmal atrial fibrillation, chronic LBBB and cardiomyopathy presents for cardiology evaluation.  He was found to have LBBB 15-20 years ago.  In 2011, he had a stress test in [facility] that was abnormal so he was taken for [facility] in 6/11 that showed nonobstructive disease.  He had an echo in 5/11 that showed EF 45-50%, diffuse hypokinesis suggesting a mild nonischemic cardiomyopathy.   He has admitted in 7/15 with atrial fibrillation with RVR.  Rate was very difficult to control.  TEE failed due to inability to pass probe x 2.  Eventually, ICE was done to rule out LAA thrombus and he was cardioverted to NSR.  He is on amiodarone now and in NSR.  Echo in 7/15 showed EF 20% with diffuse hypokinesis in setting of atrial fibrillation/RVR.  LHC was also done in 7/15 with minimal coronary disease.  Repeat echo in 10/15 showed EF 20-25%.  In 11/15, he had St Jude CRT-D device placed .  He is now on Eliquis with no BRBPR or melena.  He feels good, dyspnea only with heavy exertion.  He can walk up steps without problems.  No chest pain.  No orthopnea or PND.  He takes Lasix rarely.  Weight is up about 5 lbs, not exercising as much over the winter. No palpitations.  He is in NSR today.   Labs (11/14): K 3.9, creatinine 1.14, LDL 104, LFTs normal Labs (2/15): HCT 56.3 Labs (9/15): K 4.1, creatinine 1.1, digoxin 0.9, TSH normal, LFTs normal Labs (10/15): TGs 262, LDL 82, BNP 68 Labs (11/15): K 4.1, creatinine 1.12 Labs (12/15): K 4.8, creatinine 1.02  PMH: 1. Type II diabetes 2. HTN 3. Adrenal nodule 4. Restless leg syndrome 5. OA with left hip pain, right frozen shoulder, low back pain.  6. Testicular cancer: 1972 7. Chronic LBBB: Had Cardiolite in 2011 that was abnormal so had LHC in 6/11 in [place], [place].  This showed 30% LCx stenosis and 40% ostial RCA stenosis.  LHC in 7/15 in [place]  showed minimal coronary disease.  8. 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  Echo (5/11) with EF 45-50%, moderate LVH.  Echo (7/15) with EF 20%, diffuse hypokinesis, mild to moderately decreased RV systolic function (in setting of afib with RVR). LHC (7/15) with minimal coronary disease.  Echo (10/15) with EF 20-25%, mild LVH, moderate LAE. St Jude CRT-D device placed in 11/15.  9. Factor V Leiden + 10. Asbestosis: Mild.  Exposure while in the [employer].  11. GERD 12. PVCs 13. Polycythemia: Uncertain etiology 14. Hyperlipidemia: Myalgias with Zocor and Lipitor.  15. Atrial fibrillation: Paroxysmal.  Unable to pass TEE probe.   SH: Retired from [employer], lives with wife in [place].  2 daughters.  Occasional ETOH.  Quit smoking > 40 years ago.   FH: No premature CAD  ROS: All systems reviewed and negative except as per HPI.    Current Outpatient Prescriptions  Medication Sig Dispense Refill  . albuterol (PROVENTIL HFA;VENTOLIN HFA) 108 (90 BASE) MCG/ACT inhaler Inhale 2 puffs into the lungs every 6 (six) hours as needed for wheezing or shortness of breath.     Marland Kitchen amiodarone (PACERONE) 200 MG tablet Take 1 tablet (200 mg total) by mouth daily. 90 tablet 3  . apixaban (ELIQUIS) 5 MG TABS tablet Take 1 tablet (5 mg total) by mouth  2 (two) times daily. 180 tablet 3  . carvedilol (COREG) 12.5 MG tablet Take 1 tablet (12.5 mg total) by mouth 2 (two) times daily. 60 tablet 3  . cholecalciferol (VITAMIN D) 1000 UNITS tablet Take 1,000 Units by mouth every morning.     . digoxin (LANOXIN) 0.125 MG tablet Take 1 tablet (0.125 mg total) by mouth every morning. 90 tablet 3  . esomeprazole (NEXIUM) 40 MG capsule Take 40 mg by mouth daily at 12 noon.    . furosemide (LASIX) 40 MG tablet Take 40 mg by mouth daily as needed. For weight gain, swelling, shortness of breath, etc.    . HYDROcodone-acetaminophen (NORCO) 10-325 MG per  tablet Take 1 tablet by mouth every 6 (six) hours as needed. pain  0  . lisinopril (PRINIVIL,ZESTRIL) 10 MG tablet Take 1 tablet (10 mg total) by mouth every evening. 90 tablet 3  . lovastatin (ALTOPREV) 40 MG 24 hr tablet Take 40 mg by mouth at bedtime.    . magnesium oxide (MAG-OX) 400 MG tablet Take 400 mg by mouth every morning.     . metFORMIN (GLUCOPHAGE) 1000 MG tablet Take 1,000 mg by mouth 2 (two) times daily with a meal.    . omega-3 acid ethyl esters (LOVAZA) 1 G capsule Take 1 g by mouth 2 (two) times daily.    Marland Kitchen oxyCODONE-acetaminophen (PERCOCET/ROXICET) 5-325 MG per tablet Take 1 tablet by mouth every 6 (six) hours as needed. Back pain  0  . potassium chloride SA (K-DUR,KLOR-CON) 20 MEQ tablet Take 20 mEq by mouth daily as needed (for low potassium). Take when taking furosemide    . rOPINIRole (REQUIP) 0.5 MG tablet Take 0.5 mg by mouth daily as needed (RLS).     Marland Kitchen rOPINIRole (REQUIP) 1 MG tablet Take 1 mg by mouth at bedtime. (Pt also takes one 0.5 mg tablet at night as needed for RLS)    . sildenafil (VIAGRA) 100 MG tablet Take 100 mg by mouth daily as needed for erectile dysfunction.    Marland Kitchen spironolactone (ALDACTONE) 25 MG tablet Take 1 tablet (25 mg total) by mouth daily. Please cancel all previous orders for current medication. Change in dosage or pill size. 90 tablet 3  . testosterone cypionate (DEPO-TESTOSTERONE) 200 MG/ML injection Inject 200 mg into the muscle every Thursday.     Marland Kitchen tiZANidine (ZANAFLEX) 4 MG capsule Take 4 mg by mouth 2 (two) times daily as needed for muscle spasms.      No current facility-administered medications for this encounter.   BP 122/74 mmHg  Pulse 86  Wt 241 lb 12 oz (109.657 kg)  SpO2 95% General: NAD, overweight Neck: No JVD, no thyromegaly or thyroid nodule.  Lungs: Clear to auscultation bilaterally with normal respiratory effort. CV: Nondisplaced PMI.  Heart regular S1/S2, no S3/S4, no murmur.  No peripheral edema.  No carotid bruit.   Normal pedal pulses.  Abdomen: Soft, nontender, no hepatosplenomegaly, no distention.  Skin: Intact without lesions or rashes.  Neurologic: Alert and oriented x 3.  Psych: Normal affect. Extremities: No clubbing or cyanosis.    Assessment/Plan: 1. Cardiomyopathy: Nonischemic.  There was a pre-existing possible LBBB cardiomyopathy, but EF was down to 20% in the setting of atrial fibrillation with RVR in 7/15 (suggesting tachycardia-mediated cardiomyopathy).  However, EF did not improve in NSR (20-25% on repeat echo in 10/15).  Now with St Jude CRT-D device.  No significant coronary disease on 7/15 LHC.   NYHA class I-II symptoms without volume overload.  -  Continue current digoxin, spironolactone, and lisinopril. - Increase Coreg to 12.5 mg bid.  - Repeat echo in 4/16 to look for EF recovery with CRT.  - May continue to use Lasix prn.  - Check BMET and digoxin level today.   2. CAD: Nonobstructive, mild.  Continue statin.  No aspirin as he is anticoagulated.  3. Hyperlipidemia: Continue lovastatin.  Myalgias with other statins. Watch diet with high triglycerides.  4. HTN: BP is controlled.  5. Atrial fibrillation: Paroxysmal, now in NSR on amiodarone 200 mg daily.  Needs to maintain NSR as possible component of tachy-mediated cardiomyopathy.   - Continue current Eliquis. - Check LFTs/TSH today.  Will need yearly eye exam.   Followup in 2 months in CHF clinic.   Loralie Champagne 05/24/2014

## 2014-05-26 DIAGNOSIS — M4697 Unspecified inflammatory spondylopathy, lumbosacral region: Secondary | ICD-10-CM | POA: Diagnosis not present

## 2014-05-26 DIAGNOSIS — G894 Chronic pain syndrome: Secondary | ICD-10-CM | POA: Diagnosis not present

## 2014-05-26 DIAGNOSIS — M1990 Unspecified osteoarthritis, unspecified site: Secondary | ICD-10-CM | POA: Diagnosis not present

## 2014-05-26 DIAGNOSIS — Z79899 Other long term (current) drug therapy: Secondary | ICD-10-CM | POA: Diagnosis not present

## 2014-06-24 ENCOUNTER — Ambulatory Visit (HOSPITAL_COMMUNITY): Payer: Medicare Other

## 2014-06-24 ENCOUNTER — Encounter: Payer: Self-pay | Admitting: Cardiology

## 2014-07-02 ENCOUNTER — Other Ambulatory Visit (HOSPITAL_COMMUNITY): Payer: Self-pay | Admitting: Vascular Surgery

## 2014-07-02 ENCOUNTER — Ambulatory Visit (HOSPITAL_COMMUNITY)
Admission: RE | Admit: 2014-07-02 | Discharge: 2014-07-02 | Disposition: A | Discharge status: Home / Self Care | Payer: Medicare Other | Source: Ambulatory Visit | Attending: Family Medicine | Admitting: Family Medicine

## 2014-07-02 DIAGNOSIS — I5022 Chronic systolic (congestive) heart failure: Secondary | ICD-10-CM | POA: Diagnosis not present

## 2014-07-02 DIAGNOSIS — I509 Heart failure, unspecified: Secondary | ICD-10-CM | POA: Diagnosis not present

## 2014-07-02 NOTE — Progress Notes (Signed)
  Echocardiogram 2D Echocardiogram has been performed.  Darlina Sicilian M 07/02/2014, 12:04 PM

## 2014-07-21 DIAGNOSIS — G894 Chronic pain syndrome: Secondary | ICD-10-CM | POA: Diagnosis not present

## 2014-07-21 DIAGNOSIS — Z79899 Other long term (current) drug therapy: Secondary | ICD-10-CM | POA: Diagnosis not present

## 2014-07-21 DIAGNOSIS — M545 Low back pain: Secondary | ICD-10-CM | POA: Diagnosis not present

## 2014-07-21 DIAGNOSIS — M4697 Unspecified inflammatory spondylopathy, lumbosacral region: Secondary | ICD-10-CM | POA: Diagnosis not present

## 2014-07-21 DIAGNOSIS — M1990 Unspecified osteoarthritis, unspecified site: Secondary | ICD-10-CM | POA: Diagnosis not present

## 2014-07-23 ENCOUNTER — Ambulatory Visit (HOSPITAL_COMMUNITY)
Admission: RE | Admit: 2014-07-23 | Discharge: 2014-07-23 | Disposition: A | Discharge status: Home / Self Care | Payer: Medicare Other | Source: Ambulatory Visit | Attending: Internal Medicine | Admitting: Internal Medicine

## 2014-07-23 ENCOUNTER — Encounter (HOSPITAL_COMMUNITY): Payer: Self-pay

## 2014-07-23 VITALS — BP 112/66 | HR 60 | Wt 242.2 lb

## 2014-07-23 DIAGNOSIS — G2581 Restless legs syndrome: Secondary | ICD-10-CM | POA: Insufficient documentation

## 2014-07-23 DIAGNOSIS — E663 Overweight: Secondary | ICD-10-CM | POA: Diagnosis not present

## 2014-07-23 DIAGNOSIS — I519 Heart disease, unspecified: Secondary | ICD-10-CM

## 2014-07-23 DIAGNOSIS — I429 Cardiomyopathy, unspecified: Secondary | ICD-10-CM | POA: Diagnosis not present

## 2014-07-23 DIAGNOSIS — M19011 Primary osteoarthritis, right shoulder: Secondary | ICD-10-CM | POA: Insufficient documentation

## 2014-07-23 DIAGNOSIS — Z7901 Long term (current) use of anticoagulants: Secondary | ICD-10-CM | POA: Diagnosis not present

## 2014-07-23 DIAGNOSIS — K219 Gastro-esophageal reflux disease without esophagitis: Secondary | ICD-10-CM | POA: Diagnosis not present

## 2014-07-23 DIAGNOSIS — Z79899 Other long term (current) drug therapy: Secondary | ICD-10-CM | POA: Diagnosis not present

## 2014-07-23 DIAGNOSIS — M1612 Unilateral primary osteoarthritis, left hip: Secondary | ICD-10-CM | POA: Insufficient documentation

## 2014-07-23 DIAGNOSIS — M479 Spondylosis, unspecified: Secondary | ICD-10-CM | POA: Insufficient documentation

## 2014-07-23 DIAGNOSIS — I10 Essential (primary) hypertension: Secondary | ICD-10-CM | POA: Insufficient documentation

## 2014-07-23 DIAGNOSIS — I2510 Atherosclerotic heart disease of native coronary artery without angina pectoris: Secondary | ICD-10-CM | POA: Diagnosis not present

## 2014-07-23 DIAGNOSIS — E119 Type 2 diabetes mellitus without complications: Secondary | ICD-10-CM | POA: Diagnosis not present

## 2014-07-23 DIAGNOSIS — I480 Paroxysmal atrial fibrillation: Secondary | ICD-10-CM | POA: Insufficient documentation

## 2014-07-23 DIAGNOSIS — E785 Hyperlipidemia, unspecified: Secondary | ICD-10-CM | POA: Insufficient documentation

## 2014-07-23 DIAGNOSIS — I447 Left bundle-branch block, unspecified: Secondary | ICD-10-CM | POA: Diagnosis not present

## 2014-07-23 DIAGNOSIS — D6851 Activated protein C resistance: Secondary | ICD-10-CM | POA: Diagnosis not present

## 2014-07-23 LAB — BASIC METABOLIC PANEL
Anion gap: 8 (ref 5–15)
BUN: 20 mg/dL (ref 6–20)
CALCIUM: 9.4 mg/dL (ref 8.9–10.3)
CO2: 28 mmol/L (ref 22–32)
Chloride: 100 mmol/L — ABNORMAL LOW (ref 101–111)
Creatinine, Ser: 1.09 mg/dL (ref 0.61–1.24)
GFR calc Af Amer: >60 mL/min (ref >60)
GFR calc non Af Amer: >60 mL/min (ref >60)
Glucose, Bld: 117 mg/dL — ABNORMAL HIGH (ref 70–99)
Potassium: 4.9 mmol/L (ref 3.5–5.1)
Sodium: 136 mmol/L (ref 135–145)

## 2014-07-23 LAB — CBC
HCT: 48.4 % (ref 39.0–52.0)
Hemoglobin: 16.1 g/dL (ref 13.0–17.0)
MCH: 32.1 pg (ref 26.0–34.0)
MCHC: 33.3 g/dL (ref 30.0–36.0)
MCV: 96.6 fL (ref 78.0–100.0)
PLATELETS: 166 10*3/uL (ref 150–400)
RBC: 5.01 MIL/uL (ref 4.22–5.81)
RDW: 13.6 % (ref 11.5–15.5)
WBC: 8.5 10*3/uL (ref 4.0–10.5)

## 2014-07-23 NOTE — Patient Instructions (Signed)
STOP Digoxin.  Follow up 4 months.  Do the following things EVERYDAY: 1) Weigh yourself in the morning before breakfast. Write it down and keep it in a log. 2) Take your medicines as prescribed 3) Eat low salt foods-Limit salt (sodium) to 2000 mg per day.  4) Stay as active as you can everyday 5) Limit all fluids for the day to less than 2 liters

## 2014-07-24 NOTE — Progress Notes (Signed)
Patient ID: Walter [name], male   DOB: 03/21/1945, 69 y.o.   MRN: [id number] PCP: Dr. Orland Mustard  69 yo with history of paroxysmal atrial fibrillation, chronic LBBB and cardiomyopathy presents for cardiology evaluation.  He was found to have LBBB 15-20 years ago.  In 2011, he had a stress test in [facility] that was abnormal so he was taken for [facility] in 6/11 that showed nonobstructive disease.  He had an echo in 5/11 that showed EF 45-50%, diffuse hypokinesis suggesting a mild nonischemic cardiomyopathy.   He has admitted in 7/15 with atrial fibrillation with RVR.  Rate was very difficult to control.  TEE failed due to inability to pass probe x 2.  Eventually, ICE was done to rule out LAA thrombus and he was cardioverted to NSR.  He is on amiodarone now and in NSR.  Echo in 7/15 showed EF 20% with diffuse hypokinesis in setting of atrial fibrillation/RVR.  LHC was also done in 7/15 with minimal coronary disease.  Repeat echo in 10/15 showed EF 20-25%.  In 11/15, he had St Jude CRT-D device placed.  Repeat echo in 4/16 showed EF improved to 50% with mild LVH.   He is now on Eliquis with no BRBPR or melena.  He feels good, dyspnea only with heavy exertion.  He can walk up steps without problems.  No chest pain.  No orthopnea or PND.  He takes Lasix rarely.  Weight is stable.  No palpitations.  He is in NSR today. He golfs and push mows his yard without problems.   Labs (11/14): K 3.9, creatinine 1.14, LDL 104, LFTs normal Labs (2/15): HCT 56.3 Labs (9/15): K 4.1, creatinine 1.1, digoxin 0.9, TSH normal, LFTs normal Labs (10/15): TGs 262, LDL 82, BNP 68 Labs (11/15): K 4.1, creatinine 1.12 Labs (12/15): K 4.8, creatinine 1.02 Labs (3/16): K 4.6, creatinine 1.06, digoxin 0.8, LFTs normal, TSH normal  PMH: 1. Type II diabetes 2. HTN 3. Adrenal nodule 4. Restless leg syndrome 5. OA with left hip pain, right frozen shoulder, low back pain.  6. Testicular cancer: 1972 7. Chronic LBBB: Had Cardiolite in 2011  that was abnormal so had LHC in 6/11 in [place], [place].  This showed 30% LCx stenosis and 40% ostial RCA stenosis.  LHC in 7/15 in [place] showed minimal coronary disease.  8. 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  Echo (5/11) with EF 45-50%, moderate LVH.  Echo (7/15) with EF 20%, diffuse hypokinesis, mild to moderately decreased RV systolic function (in setting of afib with RVR). LHC (7/15) with minimal coronary disease.  Echo (10/15) with EF 20-25%, mild LVH, moderate LAE. St Jude CRT-D device placed in 11/15. Echo (4/16) with EF 50%, mild LVH.  9. Factor V Leiden + 10. Asbestosis: Mild.  Exposure while in the [employer].  11. GERD 12. PVCs 13. Polycythemia: Uncertain etiology 14. Hyperlipidemia: Myalgias with Zocor and Lipitor.  15. Atrial fibrillation: Paroxysmal.  Unable to pass TEE probe.   SH: Retired from [employer], lives with wife in [place].  2 daughters.  Occasional ETOH.  Quit smoking > 40 years ago.   FH: No premature CAD  ROS: All systems reviewed and negative except as per HPI.    Current Outpatient Prescriptions  Medication Sig Dispense Refill  . albuterol (PROVENTIL HFA;VENTOLIN HFA) 108 (90 BASE) MCG/ACT inhaler Inhale 2 puffs into the lungs every 6 (six) hours as needed for wheezing or shortness of breath.     Marland Kitchen amiodarone (  PACERONE) 200 MG tablet Take 1 tablet (200 mg total) by mouth daily. 90 tablet 3  . apixaban (ELIQUIS) 5 MG TABS tablet Take 1 tablet (5 mg total) by mouth 2 (two) times daily. 180 tablet 3  . carvedilol (COREG) 12.5 MG tablet Take 1 tablet (12.5 mg total) by mouth 2 (two) times daily. 60 tablet 3  . cholecalciferol (VITAMIN D) 1000 UNITS tablet Take 1,000 Units by mouth every morning.     Marland Kitchen esomeprazole (NEXIUM) 40 MG capsule Take 40 mg by mouth daily at 12 noon.    Marland Kitchen HYDROcodone-acetaminophen (NORCO) 10-325 MG per tablet Take 1 tablet by mouth every 6 (six) hours as  needed. pain  0  . lisinopril (PRINIVIL,ZESTRIL) 10 MG tablet Take 1 tablet (10 mg total) by mouth every evening. 90 tablet 3  . lovastatin (ALTOPREV) 40 MG 24 hr tablet Take 40 mg by mouth at bedtime.    . magnesium oxide (MAG-OX) 400 MG tablet Take 400 mg by mouth every morning.     . metFORMIN (GLUCOPHAGE) 1000 MG tablet Take 1,000 mg by mouth 2 (two) times daily with a meal.    . omega-3 acid ethyl esters (LOVAZA) 1 G capsule Take 1 g by mouth 2 (two) times daily.    Marland Kitchen oxyCODONE-acetaminophen (PERCOCET/ROXICET) 5-325 MG per tablet Take 1 tablet by mouth every 6 (six) hours as needed. Back pain  0  . potassium chloride SA (K-DUR,KLOR-CON) 20 MEQ tablet Take 20 mEq by mouth daily as needed (for low potassium). Take when taking furosemide    . rOPINIRole (REQUIP) 0.5 MG tablet Take 0.5 mg by mouth daily as needed (RLS).     Marland Kitchen rOPINIRole (REQUIP) 1 MG tablet Take 1 mg by mouth at bedtime. (Pt also takes one 0.5 mg tablet at night as needed for RLS)    . sildenafil (VIAGRA) 100 MG tablet Take 100 mg by mouth daily as needed for erectile dysfunction.    Marland Kitchen spironolactone (ALDACTONE) 25 MG tablet Take 1 tablet (25 mg total) by mouth daily. Please cancel all previous orders for current medication. Change in dosage or pill size. 90 tablet 3  . testosterone cypionate (DEPO-TESTOSTERONE) 200 MG/ML injection Inject 200 mg into the muscle every Thursday.     Marland Kitchen tiZANidine (ZANAFLEX) 4 MG capsule Take 4 mg by mouth 2 (two) times daily as needed for muscle spasms.      No current facility-administered medications for this encounter.   BP 112/66 mmHg  Pulse 60  Wt 242 lb 4 oz (109.884 kg)  SpO2 97% General: NAD, overweight Neck: No JVD, no thyromegaly or thyroid nodule.  Lungs: Clear to auscultation bilaterally with normal respiratory effort. CV: Nondisplaced PMI.  Heart regular S1/S2, no S3/S4, no murmur.  No peripheral edema.  No carotid bruit.  Normal pedal pulses.  Abdomen: Soft, nontender, no  hepatosplenomegaly, no distention.  Skin: Intact without lesions or rashes.  Neurologic: Alert and oriented x 3.  Psych: Normal affect. Extremities: No clubbing or cyanosis.  Venous varicosities lower legs.   Assessment/Plan: 1. Cardiomyopathy: Nonischemic.  There was a pre-existing possible LBBB cardiomyopathy, but EF was down to 20% in the setting of atrial fibrillation with RVR in 7/15 (suggesting tachycardia-mediated cardiomyopathy).  However, EF did not improve in NSR (20-25% on repeat echo in 10/15).  Now with St Jude CRT-D device.  No significant coronary disease on 7/15 LHC.   Since placement of CRT-D, EF has improved to 50% (4/16 echo).  NYHA class I-II symptoms  without volume overload.  - Continue current Coreg, spironolactone, and lisinopril. - He can stop digoxin.   - May continue to use Lasix prn.  - Check BMET today.   2. CAD: Nonobstructive, mild.  Continue statin.  No aspirin as he is anticoagulated.  3. Hyperlipidemia: Continue lovastatin.  Myalgias with other statins.  4. HTN: BP is controlled.  5. Atrial fibrillation: Paroxysmal, now in NSR on amiodarone 200 mg daily.  Needs to maintain NSR as possible component of tachy-mediated cardiomyopathy.   - Continue current Eliquis. - LFTs/TSH normal in 3/16.  Will need yearly eye exam.   Followup in 4 months.   Loralie Champagne 07/24/2014

## 2014-07-26 ENCOUNTER — Ambulatory Visit (INDEPENDENT_AMBULATORY_CARE_PROVIDER_SITE_OTHER): Payer: Medicare Other | Admitting: *Deleted

## 2014-07-26 DIAGNOSIS — I519 Heart disease, unspecified: Secondary | ICD-10-CM

## 2014-07-26 DIAGNOSIS — I429 Cardiomyopathy, unspecified: Secondary | ICD-10-CM | POA: Diagnosis not present

## 2014-07-26 DIAGNOSIS — I428 Other cardiomyopathies: Secondary | ICD-10-CM

## 2014-07-26 NOTE — Progress Notes (Signed)
Remote ICD transmission.   

## 2014-07-28 LAB — CUP PACEART REMOTE DEVICE CHECK
Brady Statistic RA Percent Paced: 13 %
Brady Statistic RV Percent Paced: 99 %
Date Time Interrogation Session: 20160511160528
HIGH POWER IMPEDANCE MEASURED VALUE: 71 Ohm
Lead Channel Impedance Value: 450 Ohm
Lead Channel Impedance Value: 490 Ohm
Lead Channel Setting Pacing Amplitude: 2 V
MDC IDC MSMT LEADCHNL RA IMPEDANCE VALUE: 410 Ohm
MDC IDC MSMT LEADCHNL RA SENSING INTR AMPL: 2.6 mV
MDC IDC MSMT LEADCHNL RV SENSING INTR AMPL: 12 mV
MDC IDC PG SERIAL: 7211486
MDC IDC SET LEADCHNL LV PACING AMPLITUDE: 1.75 V
MDC IDC SET LEADCHNL LV PACING PULSEWIDTH: 0.5 ms
MDC IDC SET LEADCHNL RV PACING AMPLITUDE: 2.5 V
MDC IDC SET LEADCHNL RV PACING PULSEWIDTH: 0.5 ms
MDC IDC SET LEADCHNL RV SENSING SENSITIVITY: 0.5 mV
MDC IDC SET ZONE DETECTION INTERVAL: 330 ms
Zone Setting Detection Interval: 250 ms
Zone Setting Detection Interval: 280 ms

## 2014-08-04 ENCOUNTER — Ambulatory Visit (INDEPENDENT_AMBULATORY_CARE_PROVIDER_SITE_OTHER): Payer: Medicare Other | Admitting: *Deleted

## 2014-08-04 DIAGNOSIS — I519 Heart disease, unspecified: Secondary | ICD-10-CM

## 2014-08-04 DIAGNOSIS — I429 Cardiomyopathy, unspecified: Secondary | ICD-10-CM | POA: Diagnosis not present

## 2014-08-04 NOTE — Progress Notes (Signed)
Pt was started on Eliquis 5mg  twice a day for Afib on 12/25/13 by Dr. Aundra Dubin.    Reviewed patients medication list.  Pt is not currently on any combined P-gp and strong CYP3A4 inhibitors/inducers (ketoconazole, traconazole, ritonavir, carbamazepine, phenytoin, rifampin, St. John's wort).  Reviewed lab which were completed on 07/23/14: SCr 1.09, Hgb 16.1 and HCT 48.4, Weight-107.78 kg.  Dose is appropriate based on dosing criteria.     A full discussion of the nature of anticoagulants has been carried out.  A benefit/risk analysis has been presented to the patient, so that they understand the justification for choosing anticoagulation with Eliquis at this time.  The need for compliance is stressed.  Pt is aware to take the medication twice daily.  Side effects of potential bleeding are discussed, including unusual colored urine or stools, coughing up blood or coffee ground emesis, nose bleeds or serious fall or head trauma.  Discussed signs and symptoms of stroke. The patient should avoid any OTC items containing aspirin or ibuprofen.  Avoid alcohol consumption.   Call if any signs of abnormal bleeding.  Discussed financial obligations and resolved any difficulty in obtaining medication.    Patient called and informed us that he is using a [employer] advised by Gay Filler, PharmD that the medication will not interfere with any of his current medications. Patient was advised over the phone to continue with Eliquis 5mg  twice a day.

## 2014-08-11 ENCOUNTER — Encounter: Payer: Self-pay | Admitting: Cardiology

## 2014-08-12 ENCOUNTER — Telehealth: Payer: Self-pay | Admitting: Cardiology

## 2014-08-12 DIAGNOSIS — E291 Testicular hypofunction: Secondary | ICD-10-CM | POA: Diagnosis not present

## 2014-08-12 DIAGNOSIS — J209 Acute bronchitis, unspecified: Secondary | ICD-10-CM | POA: Diagnosis not present

## 2014-08-12 NOTE — Telephone Encounter (Signed)
Spoke with pt and informed him Dr. Aundra Dubin states that it may be bronchitis, to PCP first and call back if PCP feels we need to see him. Pt states that he went to his PCP today and PCP believes it is bronchitis and prescribed some antibiotics. Pt states that PCP said if that didn't clear it up they would do an xray. Informed pt that if PCP decided he needed to be seen by Dr. Aundra Dubin to just let us know. Pt verbalized understanding and was in agreement with this plan.

## 2014-08-12 NOTE — Telephone Encounter (Signed)
Wife states that pt has been fighting a cold for the last week but has had increasing SOB for last 2-3 days. Wife states pt has had no energy for the last 2 days. Wife states pt has nonproductive cough but when coughing he sounds "juicy". Denies CP and dizziness.  Pt had one very short period of lightheadedness since illness began. No vital signs available. Informed wife that I would route this information to Dr. Aundra Dubin to review and advisement. Also informed wife to call pt's PCP to see if they could get him in to be seen today. Wife verbalized understanding and was in agreement with this plan.

## 2014-08-12 NOTE — Telephone Encounter (Signed)
May be bronchitis.  To PCP first, if they think we need to see him, call back.

## 2014-08-12 NOTE — Telephone Encounter (Signed)
Pt c/o Shortness Of Breath: STAT if SOB developed within the last 24 hours or pt is noticeably SOB on the phone  1. Are you currently SOB (can you hear that pt is SOB on the phone)? Yes, Pt has had a cold so they have ignoring it a little bit however it is not going away  2. How long have you been experiencing SOB? 2-3 days  3. Are you SOB when sitting or when up moving around? Both  4.  Are you currently experiencing any other symptoms? Fatigue, no energy

## 2014-08-18 ENCOUNTER — Encounter: Payer: Self-pay | Admitting: Internal Medicine

## 2014-08-25 ENCOUNTER — Encounter: Payer: Self-pay | Admitting: Cardiology

## 2014-08-27 DIAGNOSIS — E785 Hyperlipidemia, unspecified: Secondary | ICD-10-CM | POA: Diagnosis not present

## 2014-08-27 DIAGNOSIS — I10 Essential (primary) hypertension: Secondary | ICD-10-CM | POA: Diagnosis not present

## 2014-08-27 DIAGNOSIS — E119 Type 2 diabetes mellitus without complications: Secondary | ICD-10-CM | POA: Diagnosis not present

## 2014-08-27 DIAGNOSIS — I4891 Unspecified atrial fibrillation: Secondary | ICD-10-CM | POA: Diagnosis not present

## 2014-08-27 DIAGNOSIS — I429 Cardiomyopathy, unspecified: Secondary | ICD-10-CM | POA: Diagnosis not present

## 2014-09-16 DIAGNOSIS — M4697 Unspecified inflammatory spondylopathy, lumbosacral region: Secondary | ICD-10-CM | POA: Diagnosis not present

## 2014-09-16 DIAGNOSIS — Z79899 Other long term (current) drug therapy: Secondary | ICD-10-CM | POA: Diagnosis not present

## 2014-09-16 DIAGNOSIS — M1990 Unspecified osteoarthritis, unspecified site: Secondary | ICD-10-CM | POA: Diagnosis not present

## 2014-09-16 DIAGNOSIS — M545 Low back pain: Secondary | ICD-10-CM | POA: Diagnosis not present

## 2014-09-16 DIAGNOSIS — G894 Chronic pain syndrome: Secondary | ICD-10-CM | POA: Diagnosis not present

## 2014-09-16 DIAGNOSIS — M47817 Spondylosis without myelopathy or radiculopathy, lumbosacral region: Secondary | ICD-10-CM | POA: Diagnosis not present

## 2014-09-16 DIAGNOSIS — M488X6 Other specified spondylopathies, lumbar region: Secondary | ICD-10-CM | POA: Diagnosis not present

## 2014-09-30 DIAGNOSIS — M47817 Spondylosis without myelopathy or radiculopathy, lumbosacral region: Secondary | ICD-10-CM | POA: Diagnosis not present

## 2014-09-30 DIAGNOSIS — M545 Low back pain: Secondary | ICD-10-CM | POA: Diagnosis not present

## 2014-09-30 DIAGNOSIS — M4697 Unspecified inflammatory spondylopathy, lumbosacral region: Secondary | ICD-10-CM | POA: Diagnosis not present

## 2014-10-25 ENCOUNTER — Encounter: Payer: Medicare Other | Admitting: *Deleted

## 2014-10-25 ENCOUNTER — Telehealth: Payer: Self-pay | Admitting: Cardiology

## 2014-10-25 ENCOUNTER — Other Ambulatory Visit: Payer: Self-pay | Admitting: Nurse Practitioner

## 2014-10-25 NOTE — Telephone Encounter (Signed)
LMOVM reminding pt to send remote transmission.   

## 2014-10-26 ENCOUNTER — Encounter: Payer: Self-pay | Admitting: Cardiology

## 2014-11-10 DIAGNOSIS — G894 Chronic pain syndrome: Secondary | ICD-10-CM | POA: Diagnosis not present

## 2014-11-10 DIAGNOSIS — Z79899 Other long term (current) drug therapy: Secondary | ICD-10-CM | POA: Diagnosis not present

## 2014-11-11 DIAGNOSIS — Z79899 Other long term (current) drug therapy: Secondary | ICD-10-CM | POA: Diagnosis not present

## 2014-11-11 DIAGNOSIS — M1990 Unspecified osteoarthritis, unspecified site: Secondary | ICD-10-CM | POA: Diagnosis not present

## 2014-11-11 DIAGNOSIS — M4697 Unspecified inflammatory spondylopathy, lumbosacral region: Secondary | ICD-10-CM | POA: Diagnosis not present

## 2014-11-11 DIAGNOSIS — M47817 Spondylosis without myelopathy or radiculopathy, lumbosacral region: Secondary | ICD-10-CM | POA: Diagnosis not present

## 2014-11-11 DIAGNOSIS — G894 Chronic pain syndrome: Secondary | ICD-10-CM | POA: Diagnosis not present

## 2014-11-11 DIAGNOSIS — M545 Low back pain: Secondary | ICD-10-CM | POA: Diagnosis not present

## 2014-11-24 DIAGNOSIS — M25551 Pain in right hip: Secondary | ICD-10-CM | POA: Diagnosis not present

## 2014-11-29 IMAGING — CR DG CHEST 2V
2 series · 2 of 2 positions shown · non-contrast
Comparison: Portable chest x-ray October 09, 2013

CLINICAL DATA: Status post pacemaker defibrillator placement

EXAM:
CHEST  2 VIEW

[w chest pa]
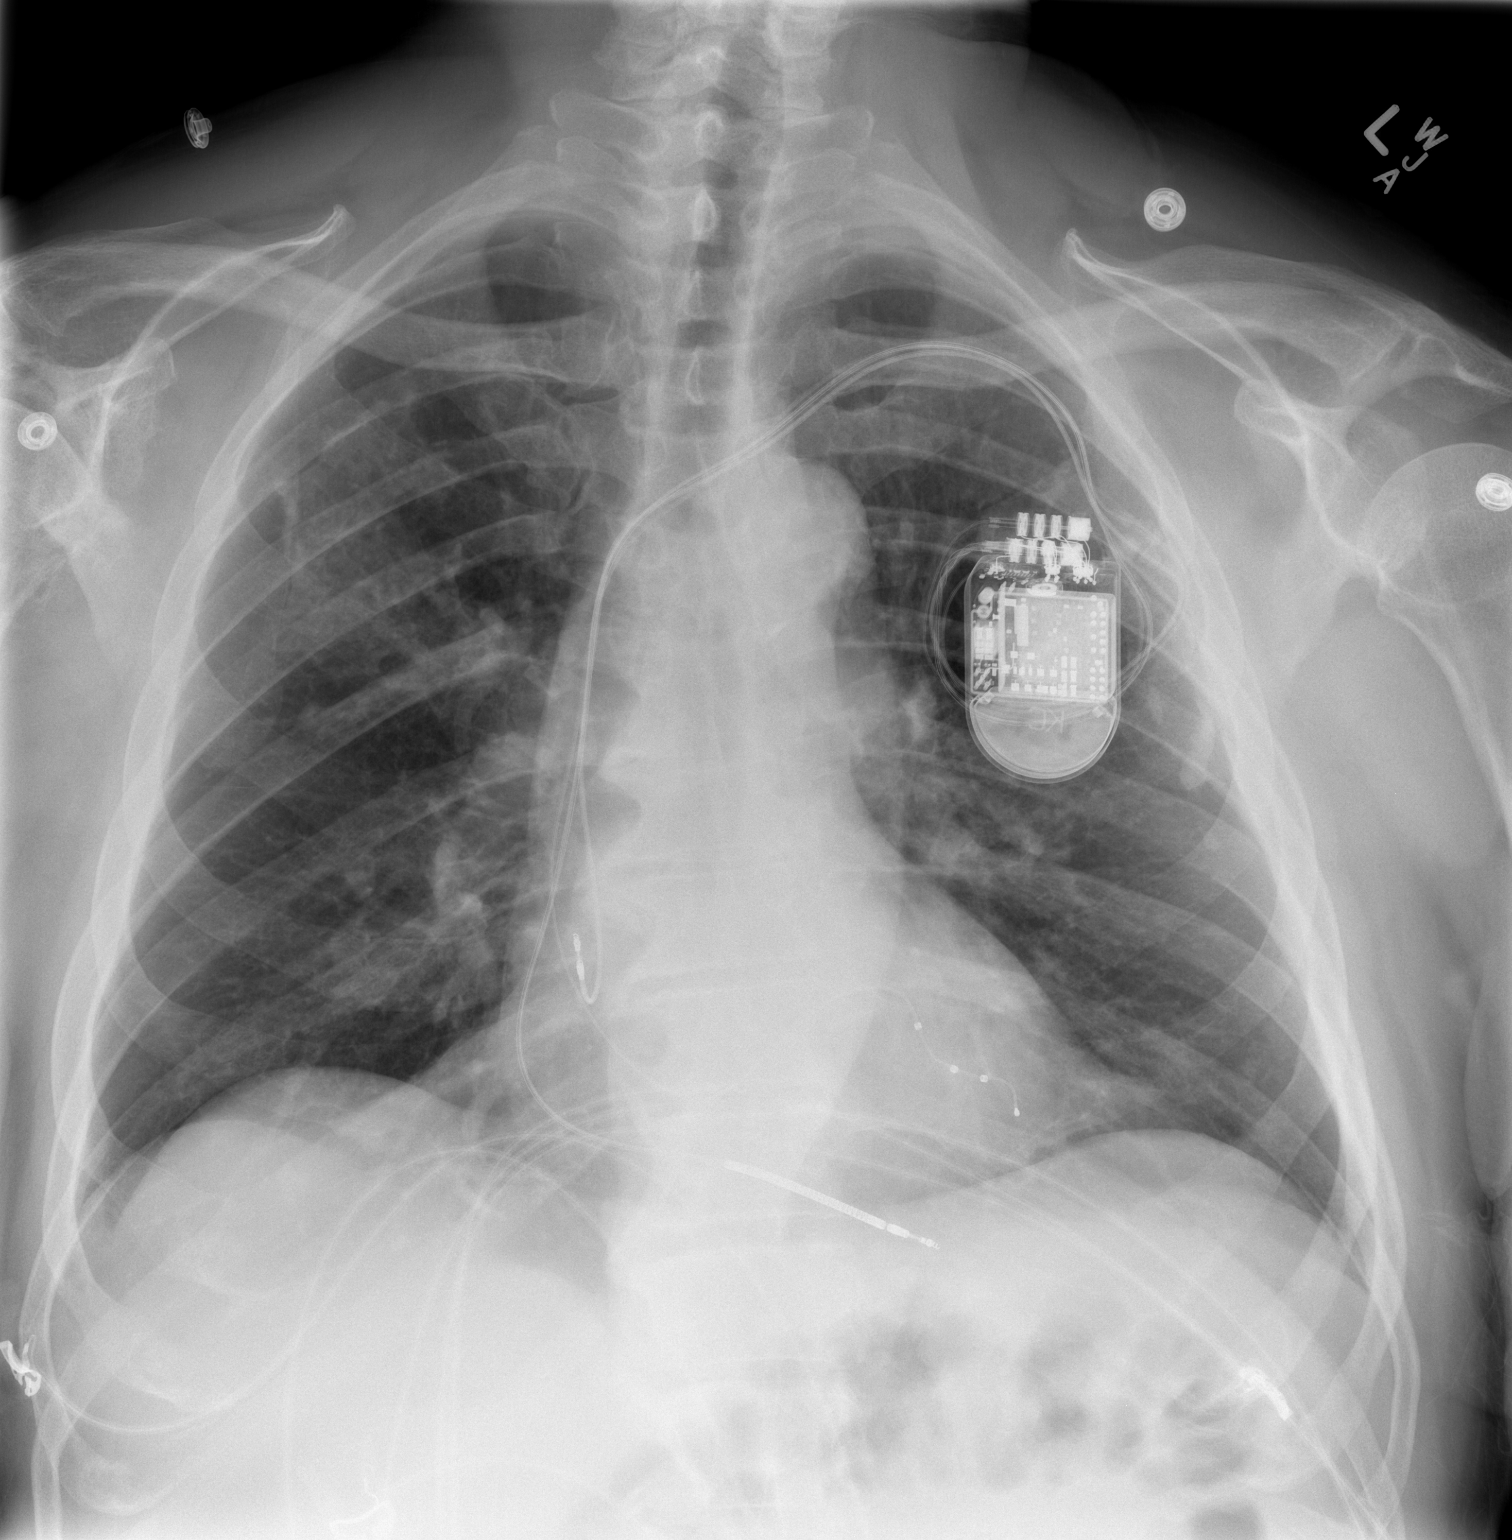

[w chest lat]
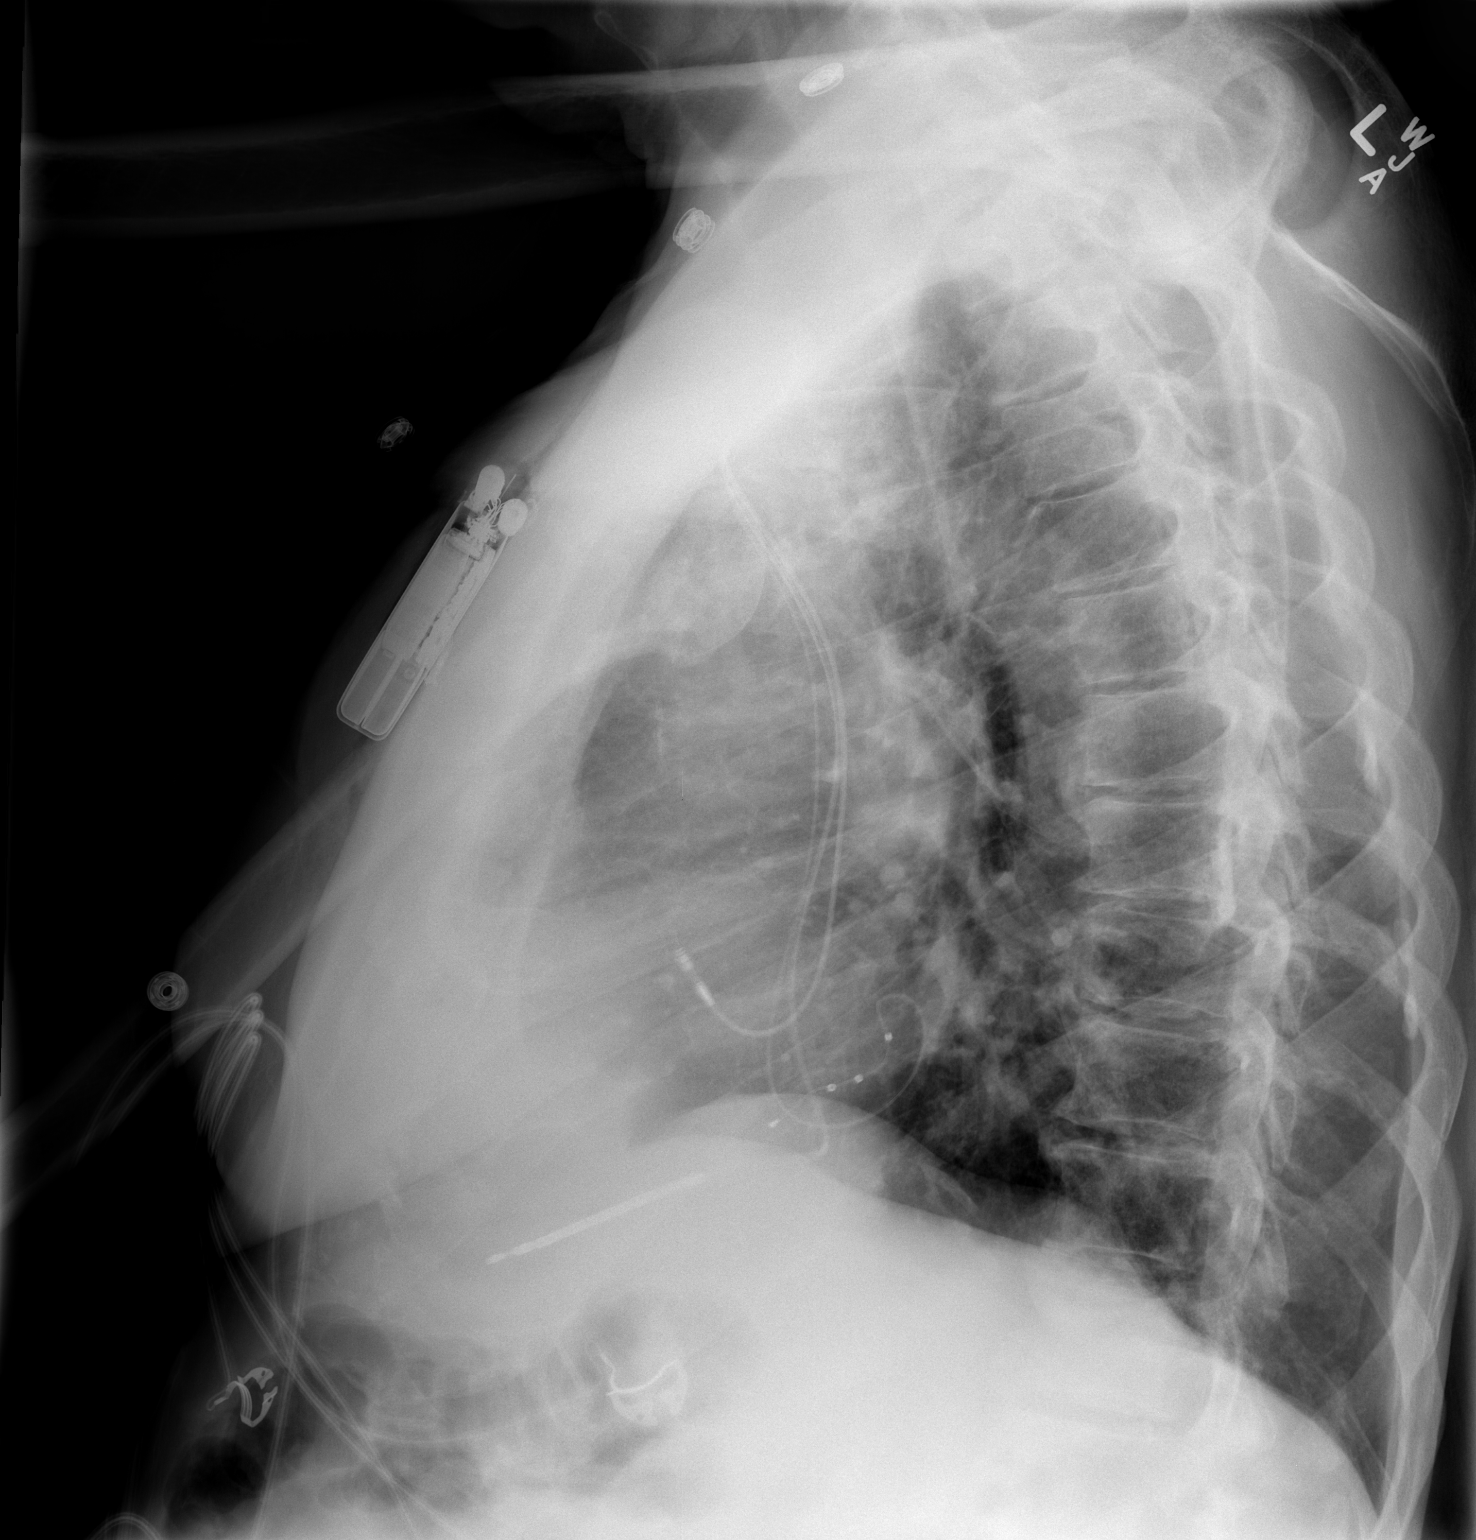

[2 of 2 positions shown; findings below may reference images not displayed]

FINDINGS: The lungs are adequately inflated. There is no pneumothorax or
significant pleural effusion. There are calcified pleural plaques
over the upper lung zones bilaterally. The heart and pulmonary
vascularity are normal. The permanent pacemaker-defibrillator is in
appropriate position radiographically. The pulmonary vascularity is
not engorged. There is mild tortuosity of the descending thoracic
aorta.
IMPRESSION: There is no postprocedure complication following placement of the
permanent pacemaker-defibrillator.

## 2014-12-01 DIAGNOSIS — Z125 Encounter for screening for malignant neoplasm of prostate: Secondary | ICD-10-CM | POA: Diagnosis not present

## 2014-12-01 DIAGNOSIS — I10 Essential (primary) hypertension: Secondary | ICD-10-CM | POA: Diagnosis not present

## 2014-12-01 DIAGNOSIS — E291 Testicular hypofunction: Secondary | ICD-10-CM | POA: Diagnosis not present

## 2014-12-01 DIAGNOSIS — N529 Male erectile dysfunction, unspecified: Secondary | ICD-10-CM | POA: Diagnosis not present

## 2014-12-01 DIAGNOSIS — E119 Type 2 diabetes mellitus without complications: Secondary | ICD-10-CM | POA: Diagnosis not present

## 2014-12-01 DIAGNOSIS — Z23 Encounter for immunization: Secondary | ICD-10-CM | POA: Diagnosis not present

## 2014-12-01 DIAGNOSIS — I4891 Unspecified atrial fibrillation: Secondary | ICD-10-CM | POA: Diagnosis not present

## 2014-12-01 DIAGNOSIS — E785 Hyperlipidemia, unspecified: Secondary | ICD-10-CM | POA: Diagnosis not present

## 2014-12-15 DIAGNOSIS — M19011 Primary osteoarthritis, right shoulder: Secondary | ICD-10-CM | POA: Diagnosis not present

## 2015-01-05 DIAGNOSIS — M792 Neuralgia and neuritis, unspecified: Secondary | ICD-10-CM | POA: Diagnosis not present

## 2015-01-05 DIAGNOSIS — G894 Chronic pain syndrome: Secondary | ICD-10-CM | POA: Diagnosis not present

## 2015-01-05 DIAGNOSIS — M545 Low back pain: Secondary | ICD-10-CM | POA: Diagnosis not present

## 2015-01-05 DIAGNOSIS — Z79899 Other long term (current) drug therapy: Secondary | ICD-10-CM | POA: Diagnosis not present

## 2015-01-14 DIAGNOSIS — R300 Dysuria: Secondary | ICD-10-CM | POA: Diagnosis not present

## 2015-01-17 DIAGNOSIS — M1611 Unilateral primary osteoarthritis, right hip: Secondary | ICD-10-CM | POA: Diagnosis not present

## 2015-01-23 ENCOUNTER — Other Ambulatory Visit (HOSPITAL_COMMUNITY): Payer: Self-pay | Admitting: Cardiology

## 2015-01-23 ENCOUNTER — Other Ambulatory Visit: Payer: Self-pay | Admitting: Internal Medicine

## 2015-01-24 ENCOUNTER — Telehealth (HOSPITAL_COMMUNITY): Payer: Self-pay

## 2015-01-24 NOTE — Telephone Encounter (Signed)
Called patient.  Patient dioes not have any new symptoms.  Sent over clearance for surgery to [place] at [phone]

## 2015-01-25 ENCOUNTER — Telehealth (HOSPITAL_COMMUNITY): Payer: Self-pay

## 2015-01-25 NOTE — Telephone Encounter (Signed)
Pre-op clearance re faxed to number given to patient which is different than the form since the receiving office did not get it yesterday. [phone]

## 2015-02-02 DIAGNOSIS — Z79899 Other long term (current) drug therapy: Secondary | ICD-10-CM | POA: Diagnosis not present

## 2015-02-02 DIAGNOSIS — M5137 Other intervertebral disc degeneration, lumbosacral region: Secondary | ICD-10-CM | POA: Diagnosis not present

## 2015-02-02 DIAGNOSIS — M1288 Other specific arthropathies, not elsewhere classified, other specified site: Secondary | ICD-10-CM | POA: Diagnosis not present

## 2015-02-02 DIAGNOSIS — G894 Chronic pain syndrome: Secondary | ICD-10-CM | POA: Diagnosis not present

## 2015-02-02 DIAGNOSIS — M47817 Spondylosis without myelopathy or radiculopathy, lumbosacral region: Secondary | ICD-10-CM | POA: Diagnosis not present

## 2015-02-07 DIAGNOSIS — M1611 Unilateral primary osteoarthritis, right hip: Secondary | ICD-10-CM | POA: Diagnosis not present

## 2015-02-07 NOTE — H&P (Signed)
TOTAL HIP ADMISSION H&P  Patient is admitted for right total hip arthroplasty.  Subjective:  Chief Complaint: right hip pain  HPI: Walter [name], 69 y.o. male, has a history of pain and functional disability in the right hip(s) due to arthritis and patient has failed non-surgical conservative treatments for greater than 12 weeks to include NSAID's and/or analgesics, corticosteriod injections, flexibility and strengthening excercises, use of assistive devices and activity modification.  Onset of symptoms was gradual starting 2 years ago with gradually worsening course since that time.The patient noted no past surgery on the right hip(s).  Patient currently rates pain in the right hip at 8 out of 10 with activity. Patient has night pain, worsening of pain with activity and weight bearing, pain that interfers with activities of daily living and pain with passive range of motion. Patient has evidence of joint space narrowing by imaging studies. This condition presents safety issues increasing the risk of falls.  There is no current active infection.  Patient Active Problem List   Diagnosis Date Noted  . Paroxysmal atrial fibrillation ([place]) 02/17/2014  . Chronic systolic dysfunction of left ventricle 01/16/2014  . Left bundle branch block 01/16/2014  . Long term (current) use of anticoagulants 10/20/2013  . Esophageal spasm 10/15/2013  . Cardiomyopathy, nonischemic ([place]) 10/12/2013  . Abnormal cardiovascular function study 10/11/2013  . Persistent atrial fibrillation ([place]) 10/09/2013  . Nonischemic cardiomyopathy ([place]) 05/18/2013  . Essential hypertension, benign 05/12/2013  . Restless leg syndrome 05/12/2013  . Insomnia 05/12/2013  . Palpitations 05/12/2013  . Encounter for long-term (current) use of other medications 05/12/2013  . Non-occlusive coronary artery disease 05/12/2013   Past Medical History  Diagnosis Date  . Essential hypertension   . DJD (degenerative joint disease)    /osteoarthritis  . Frozen shoulder     right  . Chronic left hip pain   . Chronic low back pain     /sciatica- [facility])  . Asbestosis     mild  . GERD (gastroesophageal reflux disease)   . Hypogonadism male   . Coronary artery disease, non-occlusive June 2011    Cardiac cath in [facility] following abnormal Myoview  . Left bundle branch block (LBBB) on electrocardiogram     Diagnosed close to 20 years ago  . Obesity   . Factor V Leiden   . Testicular cancer 1972    left  . Type 2 diabetes mellitus without complication [id number]    Type 2.   . Non-ischemic cardiomyopathy [facility]) June 2011    EF 45-50%; suspect related to LBBB; repeat Echo March 2015: EF 50- 55%  . Dyslipidemia, goal LDL below 100     On lovastatin (myalgias with Zocor and Lipitor)  . Atrial fibrillation     Past Surgical History  Procedure Laterality Date  . Lymph nodes    . Tonsillectomy Bilateral   . Surgery scrotal / testicular Left   . Appendectomy N/A   . Rotator cuff repair Right   . Cholecystectomy open    . Hernia repair    . Knee arthroscopy Left   . Cardiac catheterization  June 2011    Nonobstructive CAD  . Transthoracic echocardiogram  May 2011    EF 45% with diffuse hypokinesis; septal bounce from LBBB  . Transthoracic echocardiogram  March 2015    EF 50-55%. Mild concentric LVH. Septal dyssynergy from LBBB   . Cardioversion N/A 10/13/2013    Procedure: CARDIOVERSION;  Surgeon: Larey Dresser, MD;  Location: [facility] OR;  Service: Cardiovascular;  Laterality: N/A;  . Esophagogastroduodenoscopy N/A 10/15/2013    Procedure: ESOPHAGOGASTRODUODENOSCOPY (EGD);  Surgeon: Gatha Mayer, MD;  Location: [facility] ENDOSCOPY;  Service: Endoscopy;  Laterality: N/A;  bedside  . Cardioversion  10-16-2013    DCCV by [name] following intracardiac echo to rule out LAA thrombus  . Cardiac defibrillator placement  02/04/14    [facility] [place] model [place] 3365-40Q (serial Number  [id number]) biventricular ICD.  Marland Kitchen Left and right heart catheterization with coronary angiogram N/A 10/12/2013    Procedure: LEFT AND RIGHT HEART CATHETERIZATION WITH CORONARY ANGIOGRAM;  Surgeon: Troy Sine, MD;  Location: [facility] CATH LAB;  Service: Cardiovascular;  Laterality: N/A;  . Electrophysiology study N/A 10/16/2013    Procedure: ELECTROPHYSIOLOGY STUDY;  Surgeon: Coralyn Mark, MD;  Location: [place] CATH LAB;  Service: Cardiovascular;  Laterality: N/A;  . Cardioversion N/A 10/16/2013    Procedure: CARDIOVERSION;  Surgeon: Coralyn Mark, MD;  Location: [place] CATH LAB;  Service: Cardiovascular;  Laterality: N/A;  . Bi-ventricular implantable cardioverter defibrillator N/A 02/04/2014    SJM Quadra Assura BiV ICD implanted for primary prevention by [name]    No prescriptions prior to admission   Allergies  Allergen Reactions  . Statins Other (See Comments)    Myalgias.  Can only take Altoprev     Social History  Substance Use Topics  . Smoking status: Former Smoker    Types: Cigarettes  . Smokeless tobacco: Not on file  . Alcohol Use: Yes     Comment: rare    Family History  Problem Relation Age of Onset  . Hypertension Mother   . Heart disease Mother   . Heart disease Father   . Hypertension Brother   . Heart attack Maternal Uncle   . Heart disease Paternal Uncle   . Hypertension Brother      Review of Systems  Constitutional: Negative for fever and chills.  HENT: Negative for congestion.   Eyes: Negative for double vision.  Respiratory: Negative for shortness of breath and wheezing.   Cardiovascular: Negative for chest pain and palpitations.  Gastrointestinal: Negative for nausea, vomiting and abdominal pain.  Musculoskeletal:       R hip pain with ambulation  Skin: Negative for rash.  Neurological: Negative for dizziness, loss of consciousness and headaches.  Psychiatric/Behavioral: Negative for depression and suicidal ideas.  All other systems reviewed and are  negative.   Objective:  Physical Exam  Vitals reviewed. Constitutional: He is oriented to person, place, and time. He appears well-developed and well-nourished.  HENT:  Head: Normocephalic and atraumatic.  Eyes: Conjunctivae and EOM are normal. Pupils are equal, round, and reactive to light.  Neck: Normal range of motion. Neck supple.  Cardiovascular: Normal rate and intact distal pulses.   Respiratory: Effort normal and breath sounds normal.  GI: Soft. Bowel sounds are normal.  Musculoskeletal: Normal range of motion.  Neurological: He is alert and oriented to person, place, and time.  Skin: Skin is warm and dry.  Psychiatric: He has a normal mood and affect. His behavior is normal. Judgment and thought content normal.    Vital signs in last 24 hours:    Labs:   Estimated body mass index is 32.85 kg/(m^2) as calculated from the following:   Height as of 04/26/14: 6' (1.829 m).   Weight as of 07/23/14: 109.884 kg (242 lb 4 oz).   Imaging Review Plain radiographs demonstrate severe degenerative joint disease of  the right hip(s). The bone quality appears to be fair for age and reported activity level.  Assessment/Plan:  End stage arthritis, right hip(s)  The patient history, physical examination, clinical judgement of the provider and imaging studies are consistent with end stage degenerative joint disease of the right hip(s) and total hip arthroplasty is deemed medically necessary. The treatment options including medical management, injection therapy, arthroscopy and arthroplasty were discussed at length. The risks and benefits of total hip arthroplasty were presented and reviewed. The risks due to aseptic loosening, infection, stiffness, dislocation/subluxation,  thromboembolic complications and other imponderables were discussed.  The patient acknowledged the explanation, agreed to proceed with the plan and consent was signed. Patient is being admitted for inpatient treatment for  surgery, pain control, PT, OT, prophylactic antibiotics, VTE prophylaxis, progressive ambulation and ADL's and discharge planning.The patient is planning to be discharged home with home health services

## 2015-02-09 ENCOUNTER — Other Ambulatory Visit (HOSPITAL_COMMUNITY): Payer: Self-pay | Admitting: *Deleted

## 2015-02-09 NOTE — Pre-Procedure Instructions (Signed)
Walter [name]  02/09/2015     Your procedure is scheduled on Tuesday, February 22, 2015 at 10:20 AM.   Report to [facility] Entrance "A" Admitting Office at 8:15 AM.   Call this number if you have problems the morning of surgery: [phone]   Any questions prior to day of surgery, please call [phone] between 8 & 4 PM.   Remember:  Do not eat food or drink liquids after midnight Monday, 02/21/15.  Take these medicines the morning of surgery with A SIP OF WATER: Amiodarone (Pacerone), Carvedilol (Coreg), Hydrocodone - if needed, Tizanidine (Zanaflex) - if needed.  Stop Eliquis as instructed by physician/surgeon.  How to Manage Your Diabetes Before Surgery   Why is it important to control my blood sugar before and after surgery?   Improving blood sugar levels before and after surgery helps healing and can limit problems.  A way of improving blood sugar control is eating a healthy diet by:  - Eating less sugar and carbohydrates  - Increasing activity/exercise  - Talk with your doctor about reaching your blood sugar goals  High blood sugars (greater than 180 mg/dL) can raise your risk of infections and slow down your recovery so you will need to focus on controlling your diabetes during the weeks before surgery.  Make sure that the doctor who takes care of your diabetes knows about your planned surgery including the date and location.  How do I manage my blood sugars before surgery?   Check your blood sugar at least 4 times a day, 2 days before surgery to make sure that they are not too high or low.   Check your blood sugar the morning of your surgery when you wake up and every 2 hours until you get to the Short-Stay unit.   Treat a low blood sugar (less than 70 mg/dL) with 1/2 cup of clear juice (cranberry or apple), 4 glucose tablets, OR glucose gel.   Recheck blood sugar in 15 minutes after treatment (to make sure it is greater than 70 mg/dL).  If blood  sugar is not greater than 70 mg/dL on re-check, call [phone] for further instructions.    Report your blood sugar to the Short-Stay nurse when you get to Short-Stay.  References:  [facility], 2007 "How to Manage your Diabetes Before and After Surgery".  What do I do about my diabetes medications?   Do not take oral diabetes medicines (pills) the morning of surgery.   Do not wear jewelry.  Do not wear lotions, powders, or cologne.  You may wear deodorant.  Men may shave face and neck.  Do not bring valuables to the hospital.  [facility] is not responsible for any belongings or valuables.  Contacts, dentures or bridgework may not be worn into surgery.  Leave your suitcase in the car.  After surgery it may be brought to your room.  For patients admitted to the hospital, discharge time will be determined by your treatment team.  Special instructions:  Walter [name] - Preparing for Surgery  Before surgery, you can play an important role.  Because skin is not sterile, your skin needs to be as free of germs as possible.  You can reduce the number of germs on you skin by washing with CHG (chlorahexidine gluconate) soap before surgery.  CHG is an antiseptic cleaner which kills germs and bonds with the skin to continue killing germs even after washing.  Please DO NOT use  if you have an allergy to CHG or antibacterial soaps.  If your skin becomes reddened/irritated stop using the CHG and inform your nurse when you arrive at Short Stay.  Do not shave (including legs and underarms) for at least 48 hours prior to the first CHG shower.  You may shave your face.  Please follow these instructions carefully:   1.  Shower with CHG Soap the night before surgery and the                                morning of Surgery.  2.  If you choose to wash your hair, wash your hair first as usual with your       normal shampoo.  3.  After you shampoo, rinse your hair and body  thoroughly to remove the                      Shampoo.  4.  Use CHG as you would any other liquid soap.  You can apply chg directly       to the skin and wash gently with scrungie or a clean washcloth.  5.  Apply the CHG Soap to your body ONLY FROM THE NECK DOWN.        Do not use on open wounds or open sores.  Avoid contact with your eyes, ears, mouth and genitals (private parts).  Wash genitals (private parts) with your normal soap.  6.  Wash thoroughly, paying special attention to the area where your surgery        will be performed.  7.  Thoroughly rinse your body with warm water from the neck down.  8.  DO NOT shower/wash with your normal soap after using and rinsing off       the CHG Soap.  9.  Pat yourself dry with a clean towel.            10.  Wear clean pajamas.            11.  Place clean sheets on your bed the night of your first shower and do not        sleep with pets.  Day of Surgery  Do not apply any lotions the morning of surgery.  Please wear clean clothes to the hospital.  Please read over the following fact sheets that you were given. Pain Booklet, Coughing and Deep Breathing, Blood Transfusion Information, MRSA Information and Surgical Site Infection Prevention

## 2015-02-11 ENCOUNTER — Encounter (HOSPITAL_COMMUNITY): Payer: Self-pay

## 2015-02-11 ENCOUNTER — Encounter (HOSPITAL_COMMUNITY)
Admission: RE | Admit: 2015-02-11 | Discharge: 2015-02-11 | Disposition: A | Discharge status: Home / Self Care | Payer: Medicare Other | Source: Ambulatory Visit | Attending: Orthopedic Surgery | Admitting: Orthopedic Surgery

## 2015-02-11 DIAGNOSIS — Z7901 Long term (current) use of anticoagulants: Secondary | ICD-10-CM | POA: Diagnosis not present

## 2015-02-11 DIAGNOSIS — I2510 Atherosclerotic heart disease of native coronary artery without angina pectoris: Secondary | ICD-10-CM | POA: Diagnosis not present

## 2015-02-11 DIAGNOSIS — Z7984 Long term (current) use of oral hypoglycemic drugs: Secondary | ICD-10-CM | POA: Diagnosis not present

## 2015-02-11 DIAGNOSIS — I480 Paroxysmal atrial fibrillation: Secondary | ICD-10-CM | POA: Diagnosis not present

## 2015-02-11 DIAGNOSIS — E785 Hyperlipidemia, unspecified: Secondary | ICD-10-CM | POA: Diagnosis not present

## 2015-02-11 DIAGNOSIS — G2581 Restless legs syndrome: Secondary | ICD-10-CM | POA: Diagnosis not present

## 2015-02-11 DIAGNOSIS — M25559 Pain in unspecified hip: Secondary | ICD-10-CM | POA: Diagnosis not present

## 2015-02-11 DIAGNOSIS — E119 Type 2 diabetes mellitus without complications: Secondary | ICD-10-CM | POA: Diagnosis not present

## 2015-02-11 DIAGNOSIS — Z8547 Personal history of malignant neoplasm of testis: Secondary | ICD-10-CM | POA: Diagnosis not present

## 2015-02-11 DIAGNOSIS — K219 Gastro-esophageal reflux disease without esophagitis: Secondary | ICD-10-CM | POA: Diagnosis not present

## 2015-02-11 DIAGNOSIS — I447 Left bundle-branch block, unspecified: Secondary | ICD-10-CM | POA: Diagnosis not present

## 2015-02-11 DIAGNOSIS — I429 Cardiomyopathy, unspecified: Secondary | ICD-10-CM | POA: Diagnosis not present

## 2015-02-11 DIAGNOSIS — Z87891 Personal history of nicotine dependence: Secondary | ICD-10-CM | POA: Diagnosis not present

## 2015-02-11 DIAGNOSIS — Z79899 Other long term (current) drug therapy: Secondary | ICD-10-CM | POA: Diagnosis not present

## 2015-02-11 DIAGNOSIS — I10 Essential (primary) hypertension: Secondary | ICD-10-CM | POA: Diagnosis not present

## 2015-02-11 DIAGNOSIS — I519 Heart disease, unspecified: Secondary | ICD-10-CM | POA: Diagnosis not present

## 2015-02-11 DIAGNOSIS — D6851 Activated protein C resistance: Secondary | ICD-10-CM | POA: Diagnosis not present

## 2015-02-11 HISTORY — DX: Acute myocardial infarction, unspecified: I21.9

## 2015-02-11 HISTORY — DX: Presence of automatic (implantable) cardiac defibrillator: Z95.810

## 2015-02-11 HISTORY — DX: Restless legs syndrome: G25.81

## 2015-02-11 HISTORY — DX: Calculus of kidney: N20.0

## 2015-02-11 HISTORY — DX: Sleep apnea, unspecified: G47.30

## 2015-02-11 HISTORY — DX: Irritable bowel syndrome, unspecified: K58.9

## 2015-02-11 LAB — URINALYSIS, ROUTINE W REFLEX MICROSCOPIC
GLUCOSE, UA: NEGATIVE mg/dL
HGB URINE DIPSTICK: NEGATIVE
Ketones, ur: NEGATIVE mg/dL
Leukocytes, UA: NEGATIVE
Nitrite: NEGATIVE
PH: 5 (ref 5.0–8.0)
Protein, ur: NEGATIVE mg/dL
SPECIFIC GRAVITY, URINE: 1.028 (ref 1.005–1.030)

## 2015-02-11 LAB — TYPE AND SCREEN
ABO/RH(D): O POS
Antibody Screen: NEGATIVE

## 2015-02-11 LAB — BASIC METABOLIC PANEL
Anion gap: 7 (ref 5–15)
BUN: 19 mg/dL (ref 6–20)
CHLORIDE: 103 mmol/L (ref 101–111)
CO2: 27 mmol/L (ref 22–32)
Calcium: 9.4 mg/dL (ref 8.9–10.3)
Creatinine, Ser: 1.17 mg/dL (ref 0.61–1.24)
GFR calc Af Amer: >60 mL/min (ref >60)
GFR calc non Af Amer: >60 mL/min (ref >60)
Glucose, Bld: 130 mg/dL — ABNORMAL HIGH (ref 65–99)
POTASSIUM: 4.2 mmol/L (ref 3.5–5.1)
SODIUM: 137 mmol/L (ref 135–145)

## 2015-02-11 LAB — CBC
HEMATOCRIT: 56.2 % — AB (ref 39.0–52.0)
Hemoglobin: 18.6 g/dL — ABNORMAL HIGH (ref 13.0–17.0)
MCH: 31.9 pg (ref 26.0–34.0)
MCHC: 33.1 g/dL (ref 30.0–36.0)
MCV: 96.4 fL (ref 78.0–100.0)
Platelets: 163 10*3/uL (ref 150–400)
RBC: 5.83 MIL/uL — AB (ref 4.22–5.81)
RDW: 14.6 % (ref 11.5–15.5)
WBC: 7.5 10*3/uL (ref 4.0–10.5)

## 2015-02-11 LAB — SURGICAL PCR SCREEN
MRSA, PCR: NEGATIVE
Staphylococcus aureus: NEGATIVE

## 2015-02-11 LAB — ABO/RH: ABO/RH(D): O POS

## 2015-02-11 LAB — GLUCOSE, CAPILLARY: Glucose-Capillary: 129 mg/dL — ABNORMAL HIGH (ref 65–99)

## 2015-02-11 NOTE — Progress Notes (Signed)
Pt has Atrial fibrillation (since July, 2015), has an Special educational needs teacher. He states he was told that he had had a MI, but he has no idea when he had one. Pt denies any recent chest pain or sob. Pt on Eliquis, was informed by Dr. Claris Gladden office to stop 48 hours prior to surgery. He has an appt with Dr. Aundra Dubin on 02/14/15 and an appt with Dr. Rayann Heman for his ICD/pacemaker yearly check on 02/16/15.   Pt is diabetic, on Metformin only. Pt states his fasting blood sugars usually run between 93-103. Today's CBG was 129 and he states he's not had breakfast. Last A1C per pt was approximately 5 months ago and was 6.1

## 2015-02-12 LAB — HEMOGLOBIN A1C
Hgb A1c MFr Bld: 6.6 % — ABNORMAL HIGH (ref 4.8–5.6)
Mean Plasma Glucose: 143 mg/dL

## 2015-02-14 ENCOUNTER — Ambulatory Visit (HOSPITAL_COMMUNITY)
Admission: RE | Admit: 2015-02-14 | Discharge: 2015-02-14 | Disposition: A | Discharge status: Home / Self Care | Payer: Medicare Other | Source: Ambulatory Visit | Attending: Cardiology | Admitting: Cardiology

## 2015-02-14 ENCOUNTER — Telehealth (HOSPITAL_COMMUNITY): Payer: Self-pay | Admitting: *Deleted

## 2015-02-14 ENCOUNTER — Encounter (HOSPITAL_COMMUNITY): Payer: Self-pay

## 2015-02-14 ENCOUNTER — Encounter (HOSPITAL_COMMUNITY): Payer: Self-pay | Admitting: *Deleted

## 2015-02-14 ENCOUNTER — Encounter: Payer: Self-pay | Admitting: Internal Medicine

## 2015-02-14 VITALS — BP 120/72 | HR 73 | Wt 255.8 lb

## 2015-02-14 DIAGNOSIS — G2581 Restless legs syndrome: Secondary | ICD-10-CM | POA: Insufficient documentation

## 2015-02-14 DIAGNOSIS — I519 Heart disease, unspecified: Secondary | ICD-10-CM

## 2015-02-14 DIAGNOSIS — E119 Type 2 diabetes mellitus without complications: Secondary | ICD-10-CM | POA: Insufficient documentation

## 2015-02-14 DIAGNOSIS — K219 Gastro-esophageal reflux disease without esophagitis: Secondary | ICD-10-CM | POA: Insufficient documentation

## 2015-02-14 DIAGNOSIS — I10 Essential (primary) hypertension: Secondary | ICD-10-CM | POA: Diagnosis not present

## 2015-02-14 DIAGNOSIS — M25559 Pain in unspecified hip: Secondary | ICD-10-CM | POA: Diagnosis not present

## 2015-02-14 DIAGNOSIS — I429 Cardiomyopathy, unspecified: Secondary | ICD-10-CM | POA: Diagnosis not present

## 2015-02-14 DIAGNOSIS — I480 Paroxysmal atrial fibrillation: Secondary | ICD-10-CM | POA: Diagnosis not present

## 2015-02-14 DIAGNOSIS — I2510 Atherosclerotic heart disease of native coronary artery without angina pectoris: Secondary | ICD-10-CM | POA: Insufficient documentation

## 2015-02-14 DIAGNOSIS — I447 Left bundle-branch block, unspecified: Secondary | ICD-10-CM | POA: Insufficient documentation

## 2015-02-14 DIAGNOSIS — E785 Hyperlipidemia, unspecified: Secondary | ICD-10-CM | POA: Insufficient documentation

## 2015-02-14 DIAGNOSIS — Z87891 Personal history of nicotine dependence: Secondary | ICD-10-CM | POA: Insufficient documentation

## 2015-02-14 DIAGNOSIS — Z7901 Long term (current) use of anticoagulants: Secondary | ICD-10-CM | POA: Insufficient documentation

## 2015-02-14 DIAGNOSIS — Z79899 Other long term (current) drug therapy: Secondary | ICD-10-CM | POA: Insufficient documentation

## 2015-02-14 DIAGNOSIS — Z7984 Long term (current) use of oral hypoglycemic drugs: Secondary | ICD-10-CM | POA: Insufficient documentation

## 2015-02-14 DIAGNOSIS — Z8547 Personal history of malignant neoplasm of testis: Secondary | ICD-10-CM | POA: Insufficient documentation

## 2015-02-14 DIAGNOSIS — D6851 Activated protein C resistance: Secondary | ICD-10-CM | POA: Insufficient documentation

## 2015-02-14 LAB — LIPID PANEL
CHOL/HDL RATIO: 4.3 ratio
Cholesterol: 145 mg/dL (ref 0–200)
HDL: 34 mg/dL — AB (ref >40)
LDL CALC: 75 mg/dL (ref 0–99)
TRIGLYCERIDES: 182 mg/dL — AB (ref <150)
VLDL: 36 mg/dL (ref 0–40)

## 2015-02-14 LAB — COMPREHENSIVE METABOLIC PANEL
ALBUMIN: 4.1 g/dL (ref 3.5–5.0)
ALT: 20 U/L (ref 17–63)
ANION GAP: 9 (ref 5–15)
AST: 21 U/L (ref 15–41)
Alkaline Phosphatase: 40 U/L (ref 38–126)
BUN: 19 mg/dL (ref 6–20)
CHLORIDE: 102 mmol/L (ref 101–111)
CO2: 27 mmol/L (ref 22–32)
Calcium: 9.4 mg/dL (ref 8.9–10.3)
Creatinine, Ser: 1.31 mg/dL — ABNORMAL HIGH (ref 0.61–1.24)
GFR calc Af Amer: >60 mL/min (ref >60)
GFR calc non Af Amer: 53 mL/min — ABNORMAL LOW (ref >60)
GLUCOSE: 111 mg/dL — AB (ref 65–99)
POTASSIUM: 4.5 mmol/L (ref 3.5–5.1)
Sodium: 138 mmol/L (ref 135–145)
Total Bilirubin: 1 mg/dL (ref 0.3–1.2)
Total Protein: 6.3 g/dL — ABNORMAL LOW (ref 6.5–8.1)

## 2015-02-14 LAB — TSH: TSH: 3.46 u[IU]/mL (ref 0.350–4.500)

## 2015-02-14 MED ORDER — FUROSEMIDE 20 MG PO TABS: 20.0000 mg | ORAL_TABLET | Freq: Every day | ORAL | Status: DC

## 2015-02-14 MED ORDER — AMIODARONE HCL 200 MG PO TABS: 200.0000 mg | ORAL_TABLET | Freq: Every day | ORAL | Status: DC

## 2015-02-14 MED ORDER — LISINOPRIL 10 MG PO TABS: 10.0000 mg | ORAL_TABLET | Freq: Every evening | ORAL | Status: DC

## 2015-02-14 MED ORDER — SPIRONOLACTONE 25 MG PO TABS: ORAL_TABLET | ORAL | Status: DC

## 2015-02-14 MED ORDER — CARVEDILOL 12.5 MG PO TABS: 12.5000 mg | ORAL_TABLET | Freq: Two times a day (BID) | ORAL | Status: DC

## 2015-02-14 MED ORDER — APIXABAN 5 MG PO TABS: 5.0000 mg | ORAL_TABLET | Freq: Two times a day (BID) | ORAL | Status: DC

## 2015-02-14 NOTE — Patient Instructions (Signed)
CHANGE Lasix to 20 mg, one tab daily  Labs today  Your physician recommends that you schedule a follow-up appointment in: 4 months  Do the following things EVERYDAY: 1) Weigh yourself in the morning before breakfast. Write it down and keep it in a log. 2) Take your medicines as prescribed 3) Eat low salt foods-Limit salt (sodium) to 2000 mg per day.  4) Stay as active as you can everyday 5) Limit all fluids for the day to less than 2 liters 6)

## 2015-02-14 NOTE — Telephone Encounter (Signed)
Mailed lipid letter to pt

## 2015-02-14 NOTE — Progress Notes (Signed)
Patient ID: Walter [name], male   DOB: Dec 26, 1945, 70 y.o.   MRN: [id number] PCP: Dr. Orland Mustard  69 yo with history of paroxysmal atrial fibrillation, chronic LBBB and cardiomyopathy presents for cardiology evaluation.  He was found to have LBBB 15-20 years ago.  In 2011, he had a stress test in [facility] that was abnormal so he was taken for [facility] in 6/11 that showed nonobstructive disease.  He had an echo in 5/11 that showed EF 45-50%, diffuse hypokinesis suggesting a mild nonischemic cardiomyopathy.   He has admitted in 7/15 with atrial fibrillation with RVR.  Rate was very difficult to control.  TEE failed due to inability to pass probe x 2.  Eventually, ICE was done to rule out LAA thrombus and he was cardioverted to NSR.  He is on amiodarone now and in NSR.  Echo in 7/15 showed EF 20% with diffuse hypokinesis in setting of atrial fibrillation/RVR.  LHC was also done in 7/15 with minimal coronary disease.  Repeat echo in 10/15 showed EF 20-25%.  In 11/15, he had St Jude CRT-D device placed.  Repeat echo in 4/16 showed EF improved to 50% with mild LVH.   He is now on Eliquis with no BRBPR or melena.  No dyspnea but not very active due to hip pain.  Has arthritis in right hip that is severe, needs THR.  Not playing golf or walking far because of pain.  Weight is up 13 lbs, attributes this to lack of activity.  No chest pain.  No orthopnea or PND.  He takes Lasix rarely.  No palpitations.  He is in NSR today.    He is scheduled for THR on right in 12/16.    Corevue checked today, he appears to have excess fluid 9/16 to early 10/16, now stabilized.   Labs (11/14): K 3.9, creatinine 1.14, LDL 104, LFTs normal Labs (2/15): HCT 56.3 Labs (9/15): K 4.1, creatinine 1.1, digoxin 0.9, TSH normal, LFTs normal Labs (10/15): TGs 262, LDL 82, BNP 68 Labs (11/15): K 4.1, creatinine 1.12 Labs (12/15): K 4.8, creatinine 1.02 Labs (3/16): K 4.6, creatinine 1.06, digoxin 0.8, LFTs normal, TSH normal Labs (11/16): K  4.2, creatinine 1.17, HCT 56.2  PMH: 1. Type II diabetes 2. HTN 3. Adrenal nodule 4. Restless leg syndrome 5. OA with right hip pain, right frozen shoulder, low back pain.  6. Testicular cancer: 1972 7. Chronic LBBB: Had Cardiolite in 2011 that was abnormal so had LHC in 6/11 in [place], [place].  This showed 30% LCx stenosis and 40% ostial RCA stenosis.  LHC in 7/15 in [place] showed minimal coronary disease.  8. 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  Echo (5/11) with EF 45-50%, moderate LVH.  Echo (7/15) with EF 20%, diffuse hypokinesis, mild to moderately decreased RV systolic function (in setting of afib with RVR). LHC (7/15) with minimal coronary disease.  Echo (10/15) with EF 20-25%, mild LVH, moderate LAE. St Jude CRT-D device placed in 11/15. Echo (4/16) with EF 50%, mild LVH.  9. Factor V Leiden + 10. Asbestosis: Mild.  Exposure while in the [employer].  11. GERD 12. PVCs 13. Polycythemia: Uncertain etiology 14. Hyperlipidemia: Myalgias with Zocor and Lipitor.  15. Atrial fibrillation: Paroxysmal.  Unable to pass TEE probe.   SH: Retired from [employer], lives with wife in [place].  2 daughters.  Occasional ETOH.  Quit smoking > 40 years ago.   FH: No premature CAD  ROS: All systems reviewed  and negative except as per HPI.    Current Outpatient Prescriptions  Medication Sig Dispense Refill  . amiodarone (PACERONE) 200 MG tablet Take 1 tablet (200 mg total) by mouth daily. 90 tablet 3  . apixaban (ELIQUIS) 5 MG TABS tablet Take 1 tablet (5 mg total) by mouth 2 (two) times daily. 180 tablet 3  . carvedilol (COREG) 12.5 MG tablet Take 1 tablet (12.5 mg total) by mouth 2 (two) times daily. 180 tablet 3  . cholecalciferol (VITAMIN D) 1000 UNITS tablet Take 1,000 Units by mouth every morning.     . diclofenac (FLECTOR) 1.3 % PTCH Place 1 patch onto the skin 2 (two) times daily as needed.     Marland Kitchen esomeprazole  (NEXIUM) 40 MG capsule Take 40 mg by mouth daily at 12 noon.    . furosemide (LASIX) 20 MG tablet Take 1 tablet (20 mg total) by mouth daily. 90 tablet 3  . gabapentin (NEURONTIN) 300 MG capsule Take 600 mg by mouth every evening.     Marland Kitchen HYDROcodone-acetaminophen (NORCO) 10-325 MG per tablet Take 1 tablet by mouth every 6 (six) hours as needed. pain  0  . lisinopril (PRINIVIL,ZESTRIL) 10 MG tablet Take 1 tablet (10 mg total) by mouth every evening. 90 tablet 3  . lovastatin (ALTOPREV) 40 MG 24 hr tablet Take 40 mg by mouth at bedtime.    . magnesium oxide (MAG-OX) 400 MG tablet Take 400 mg by mouth every morning.     . metFORMIN (GLUCOPHAGE) 1000 MG tablet Take 1,000 mg by mouth 2 (two) times daily with a meal.    . omega-3 acid ethyl esters (LOVAZA) 1 G capsule Take 1 g by mouth 2 (two) times daily.    Marland Kitchen oxyCODONE-acetaminophen (PERCOCET/ROXICET) 5-325 MG per tablet Take 1 tablet by mouth every 6 (six) hours as needed. Back pain  0  . potassium chloride SA (K-DUR,KLOR-CON) 20 MEQ tablet Take 20 mEq by mouth daily as needed (for low potassium). Take when taking furosemide    . rOPINIRole (REQUIP) 0.5 MG tablet Take 0.5 mg by mouth daily as needed (RLS).     Marland Kitchen rOPINIRole (REQUIP) 1 MG tablet Take 1 mg by mouth at bedtime. (Pt also takes one 0.5 mg tablet at night as needed for RLS)    . sildenafil (VIAGRA) 100 MG tablet Take 100 mg by mouth daily as needed for erectile dysfunction.    Marland Kitchen spironolactone (ALDACTONE) 25 MG tablet TAKE 1 TABLET DAILY 90 tablet 3  . testosterone cypionate (DEPO-TESTOSTERONE) 200 MG/ML injection Inject 200 mg into the muscle every Thursday.     Marland Kitchen tiZANidine (ZANAFLEX) 4 MG capsule Take 4 mg by mouth 2 (two) times daily as needed for muscle spasms.      No current facility-administered medications for this encounter.   BP 120/72 mmHg  Pulse 73  Wt 255 lb 12.8 oz (116.03 kg)  SpO2 93% General: NAD, overweight Neck: No JVD, no thyromegaly or thyroid nodule.  Lungs:  Clear to auscultation bilaterally with normal respiratory effort. CV: Nondisplaced PMI.  Heart regular S1/S2, no S3/S4, no murmur.  1+ edema 1/3 to knees bilaterally. No carotid bruit.  Normal pedal pulses.  Abdomen: Soft, nontender, no hepatosplenomegaly, no distention.  Skin: Intact without lesions or rashes.  Neurologic: Alert and oriented x 3.  Psych: Normal affect. Extremities: No clubbing or cyanosis.  Venous varicosities lower legs.   Assessment/Plan: 1. Cardiomyopathy: Nonischemic.  There was a pre-existing possible LBBB cardiomyopathy, but EF was down to  20% in the setting of atrial fibrillation with RVR in 7/15 (suggesting tachycardia-mediated cardiomyopathy).  However, EF did not improve in NSR (20-25% on repeat echo in 10/15).  Now with St Jude CRT-D device.  No significant coronary disease on 7/15 LHC.   Since placement of CRT-D, EF has improved to 50% (4/16 echo).  NYHA class I-II symptoms.  JVP not obviously elevated but weight is up and he has more peripheral edema.  Think think the weight is up mostly due to inactivity with hip pain.   - Continue current Coreg, spironolactone, and lisinopril. - Start Lasix 20 mg daily.   2. CAD: Nonobstructive, mild.  Continue statin.  No aspirin as he is anticoagulated.  3. Hyperlipidemia: Continue lovastatin.  Myalgias with other statins. Check lipids today.  4. HTN: BP is controlled.  5. Atrial fibrillation: Paroxysmal, now in NSR on amiodarone 200 mg daily.  Needs to maintain NSR as possible component of tachy-mediated cardiomyopathy.   - Continue current Eliquis, can hold 2 days prior to surgery.  - Check LFTs/TSH today.  Will need yearly eye exam.  - Can take bid amiodarone for a couple of days prior to hip surgery, will be at higher risk for recurrent atrial fibrillation at that time.   Followup in 4 months.   Loralie Champagne 02/14/2015

## 2015-02-14 NOTE — Progress Notes (Addendum)
Anesthesia Chart Review:  Pt is 69 year old male scheduled for R total hip arthroplasty anterior approach on 02/22/2015 with Dr. Alain Marion.   Cardiologist is Dr. Loralie Champagne, last office visit 02/14/15; Dr. Kirk Ruths is aware of upcoming surgery. EP cardiologist Dr. Rayann Heman, last office visit 02/16/15, f/u 1 year recommended; Dr. Rayann Heman is aware of upcoming surgery.   PMH includes:  CAD, MI, non-ischemic cardiomyopathy, AICD (St. Jude), LBBB, atrial fibrillation, HTN, DM, hyperlipidemia, OSA (no CPAP), asbestosis, testicular cancer, GERD. Former smoker. BMI 35.   Medications include: amiodarone, eliquis, carvedilol, nexium, lasix, lisinopril, metformin, potassium, viagra spironolactone. Eliquis to be stopped 48 hours prior to surgery.   Preoperative labs reviewed.  Glucose 130, hgbA1c 6.6. PT to be obtained DOS.   EKG 02/14/15: Atrial-sensed ventricular-paced rhythm. Biventricular pacemaker detected.   Echo 07/02/14:  - Left ventricle: ABnormal septal motion The cavity size was mildly dilated. Wall thickness was increased in a pattern of mild LVH. The estimated ejection fraction was 50%. - Mitral valve: Calcified annulus. Mildly thickened leaflets. There was mild regurgitation. - Left atrium: The atrium was mildly dilated. - Atrial septum: No defect or patent foramen ovale was identified.  R and L cardiac cath 10/12/13:  - Severe nonischemic cardiomyopathy - Mild coronary calcification without significant coronary obstructive disease with mild smooth 20% proximal circumflex stenosis.  - Atrial fibrillation with rapid ventricular response - Transient profound bradycardia /near asystole requiring atropine.  Perioperative prescription for implanted cardiac devices pending.   If no changes, I anticipate pt can proceed with surgery as scheduled.   Willeen Cass, FNP-BC [facility] Short Stay Surgical Center/Anesthesiology Phone: [phone] 02/17/2015 2:15 PM

## 2015-02-16 ENCOUNTER — Ambulatory Visit (INDEPENDENT_AMBULATORY_CARE_PROVIDER_SITE_OTHER): Payer: Medicare Other | Admitting: Internal Medicine

## 2015-02-16 ENCOUNTER — Encounter: Payer: Self-pay | Admitting: Internal Medicine

## 2015-02-16 VITALS — BP 126/78 | HR 72 | Ht 72.0 in | Wt 257.6 lb

## 2015-02-16 DIAGNOSIS — I429 Cardiomyopathy, unspecified: Secondary | ICD-10-CM | POA: Diagnosis not present

## 2015-02-16 DIAGNOSIS — I481 Persistent atrial fibrillation: Secondary | ICD-10-CM | POA: Diagnosis not present

## 2015-02-16 DIAGNOSIS — I428 Other cardiomyopathies: Secondary | ICD-10-CM

## 2015-02-16 DIAGNOSIS — I4819 Other persistent atrial fibrillation: Secondary | ICD-10-CM

## 2015-02-16 DIAGNOSIS — I447 Left bundle-branch block, unspecified: Secondary | ICD-10-CM

## 2015-02-16 DIAGNOSIS — I519 Heart disease, unspecified: Secondary | ICD-10-CM | POA: Diagnosis not present

## 2015-02-16 LAB — CUP PACEART INCLINIC DEVICE CHECK
Brady Statistic RA Percent Paced: 12 %
Brady Statistic RV Percent Paced: 99 %
HIGH POWER IMPEDANCE MEASURED VALUE: 69.75 Ohm
Implantable Lead Implant Date: 20151119
Implantable Lead Location: 753858
Implantable Lead Location: 753859
Implantable Lead Location: 753860
Lead Channel Impedance Value: 462.5 Ohm
Lead Channel Pacing Threshold Amplitude: 0.75 V
Lead Channel Pacing Threshold Pulse Width: 0.5 ms
Lead Channel Pacing Threshold Pulse Width: 0.5 ms
Lead Channel Pacing Threshold Pulse Width: 0.5 ms
Lead Channel Sensing Intrinsic Amplitude: 12 mV
Lead Channel Sensing Intrinsic Amplitude: 3.2 mV
Lead Channel Setting Pacing Amplitude: 2 V
Lead Channel Setting Pacing Amplitude: 2.5 V
Lead Channel Setting Pacing Pulse Width: 0.5 ms
Lead Channel Setting Sensing Sensitivity: 0.5 mV
MDC IDC LEAD IMPLANT DT: 20151119
MDC IDC LEAD IMPLANT DT: 20151119
MDC IDC MSMT BATTERY REMAINING LONGEVITY: 68.4
MDC IDC MSMT LEADCHNL LV IMPEDANCE VALUE: 487.5 Ohm
MDC IDC MSMT LEADCHNL LV PACING THRESHOLD AMPLITUDE: 0.5 V
MDC IDC MSMT LEADCHNL RA IMPEDANCE VALUE: 437.5 Ohm
MDC IDC MSMT LEADCHNL RA PACING THRESHOLD AMPLITUDE: 0.5 V
MDC IDC PG SERIAL: 7211486
MDC IDC SESS DTM: 20161130142118
MDC IDC SET LEADCHNL LV PACING AMPLITUDE: 1.5 V
MDC IDC SET LEADCHNL LV PACING PULSEWIDTH: 0.5 ms

## 2015-02-16 NOTE — Patient Instructions (Addendum)
Medication Instructions:  Your physician recommends that you continue on your current medications as directed. Please refer to the Current Medication list given to you today.   Labwork: None ordered   Testing/Procedures: None ordered   Follow-Up: Remote monitoring is used to monitor your  ICD from home. This monitoring reduces the number of office visits required to check your device to one time per year. It allows [place] to keep an eye on the functioning of your device to ensure it is working properly. You are scheduled for a device check from home on 05/18/15. You may send your transmission at any time that day. If you have a wireless device, the transmission will be sent automatically. After your physician reviews your transmission, you will receive a postcard with your next transmission date.  Sharman Cheek, RN will call you for monthly ICM checks  Your physician wants you to follow-up in: 12 months with [place] will receive a reminder letter in the mail two months in advance. If you don't receive a letter, please call our office to schedule the follow-up appointment.   Any Other Special Instructions Will Be Listed Below (If Applicable).     If you need a refill on your cardiac medications before your next appointment, please call your pharmacy.

## 2015-02-16 NOTE — Progress Notes (Signed)
Electrophysiology Office Note   Date:  02/16/2015   ID:  Walter [name], DOB March 02, 1946, MRN [id number]  PCP:  London Pepper, MD  Cardiologist:  Dr Aundra Dubin Primary Electrophysiologist: Thompson Grayer, MD    Chief Complaint  Patient presents with  . NICM  . Chronic systolic dysfunction the the LV  . PAF     History of Present Illness: Walter [name] is a 69 y.o. male who presents today for electrophysiology evaluation.   Since his recent BiV ICD implant, he has done well.  His EF has improved dramatically.   His energy is better.  His primary concern is with knee pain.  He is planned to have surgery soon.  Today, he denies symptoms of palpitations, chest pain, shortness of breath, orthopnea, PND, lower extremity edema, claudication, dizziness, presyncope, syncope, bleeding, or neurologic sequela. The patient is tolerating medications without difficulties and is otherwise without complaint today.    Past Medical History  Diagnosis Date  . Essential hypertension   . DJD (degenerative joint disease)     /osteoarthritis  . Frozen shoulder     right  . Chronic left hip pain   . Chronic low back pain     /sciatica- Dr Letta Median [facility])  . Asbestosis ([place])     mild  . GERD (gastroesophageal reflux disease)   . Hypogonadism male   . Coronary artery disease, non-occlusive June 2011    Cardiac cath in [facility] following abnormal Myoview  . Left bundle branch block (LBBB) on electrocardiogram     Diagnosed close to 20 years ago  . Obesity   . Factor V Leiden ([place])   . Testicular cancer ([place]) 1972    left  . Type 2 diabetes mellitus without complication ([place]) [id number]    Type 2.   . Non-ischemic cardiomyopathy [facility]) June 2011    EF 45-50%; suspect related to LBBB; repeat Echo March 2015: EF 50- 55%  . Dyslipidemia, goal LDL below 100     On lovastatin (myalgias with Zocor and Lipitor)  . Atrial fibrillation ([place])   . AICD (automatic  cardioverter/defibrillator) present   . Myocardial infarction ([place])   . Sleep apnea     does not use cpap  . Kidney stone   . Restless leg syndrome   . Irritable bowel    Past Surgical History  Procedure Laterality Date  . Lymph nodes    . Tonsillectomy Bilateral   . Surgery scrotal / testicular Left   . Appendectomy N/A   . Rotator cuff repair Right   . Cholecystectomy open    . Hernia repair    . Knee arthroscopy Left   . Cardiac catheterization  June 2011    Nonobstructive CAD  . Transthoracic echocardiogram  May 2011    EF 45% with diffuse hypokinesis; septal bounce from LBBB  . Transthoracic echocardiogram  March 2015    EF 50-55%. Mild concentric LVH. Septal dyssynergy from LBBB   . Cardioversion N/A 10/13/2013    Procedure: CARDIOVERSION;  Surgeon: Larey Dresser, MD;  Location: [place];  Service: Cardiovascular;  Laterality: N/A;  . Esophagogastroduodenoscopy N/A 10/15/2013    Procedure: ESOPHAGOGASTRODUODENOSCOPY (EGD);  Surgeon: Gatha Mayer, MD;  Location: [facility] ENDOSCOPY;  Service: Endoscopy;  Laterality: N/A;  bedside  . Cardioversion  10-16-2013    DCCV by [name] following intracardiac echo to rule out LAA thrombus  . Cardiac defibrillator placement  02/04/14    [employer] Quadra Assura model  CD [id number] (serial Number [id number]) biventricular ICD.  Marland Kitchen Left and right heart catheterization with coronary angiogram N/A 10/12/2013    Procedure: LEFT AND RIGHT HEART CATHETERIZATION WITH CORONARY ANGIOGRAM;  Surgeon: Troy Sine, MD;  Location: [facility] CATH LAB;  Service: Cardiovascular;  Laterality: N/A;  . Electrophysiology study N/A 10/16/2013    Procedure: ELECTROPHYSIOLOGY STUDY;  Surgeon: Coralyn Mark, MD;  Location: [place] CATH LAB;  Service: Cardiovascular;  Laterality: N/A;  . Cardioversion N/A 10/16/2013    Procedure: CARDIOVERSION;  Surgeon: Coralyn Mark, MD;  Location: [place] CATH LAB;  Service: Cardiovascular;  Laterality: N/A;  . Bi-ventricular implantable  cardioverter defibrillator N/A 02/04/2014    SJM Quadra Assura BiV ICD implanted for primary prevention by [name]     Current Outpatient Prescriptions  Medication Sig Dispense Refill  . amiodarone (PACERONE) 200 MG tablet Take 1 tablet (200 mg total) by mouth daily. 90 tablet 3  . apixaban (ELIQUIS) 5 MG TABS tablet Take 1 tablet (5 mg total) by mouth 2 (two) times daily. 180 tablet 3  . carvedilol (COREG) 12.5 MG tablet Take 1 tablet (12.5 mg total) by mouth 2 (two) times daily. 180 tablet 3  . cholecalciferol (VITAMIN D) 1000 UNITS tablet Take 1,000 Units by mouth every morning.     . diclofenac (FLECTOR) 1.3 % PTCH Place 1 patch onto the skin 2 (two) times daily as needed (pain).     Marland Kitchen esomeprazole (NEXIUM) 40 MG capsule Take 40 mg by mouth daily at 12 noon.    . furosemide (LASIX) 20 MG tablet Take 1 tablet (20 mg total) by mouth daily. 90 tablet 3  . gabapentin (NEURONTIN) 300 MG capsule Take 600 mg by mouth every evening.     Marland Kitchen HYDROcodone-acetaminophen (NORCO) 10-325 MG per tablet Take 1 tablet by mouth every 6 (six) hours as needed. pain  0  . lisinopril (PRINIVIL,ZESTRIL) 10 MG tablet Take 1 tablet (10 mg total) by mouth every evening. 90 tablet 3  . lovastatin (ALTOPREV) 40 MG 24 hr tablet Take 40 mg by mouth at bedtime.    . magnesium oxide (MAG-OX) 400 MG tablet Take 400 mg by mouth every morning.     . metFORMIN (GLUCOPHAGE) 1000 MG tablet Take 1,000 mg by mouth 2 (two) times daily with a meal.    . omega-3 acid ethyl esters (LOVAZA) 1 G capsule Take 1 g by mouth 2 (two) times daily.    Marland Kitchen oxyCODONE-acetaminophen (PERCOCET/ROXICET) 5-325 MG per tablet Take 1 tablet by mouth every 6 (six) hours as needed. Back pain  0  . rOPINIRole (REQUIP) 0.5 MG tablet Take 0.5 mg by mouth daily as needed (RLS).     Marland Kitchen rOPINIRole (REQUIP) 1 MG tablet Take 1 mg by mouth at bedtime. (Pt also takes one 0.5 mg tablet at night as needed for RLS)    . sildenafil (VIAGRA) 100 MG tablet Take 100 mg by  mouth daily as needed for erectile dysfunction.    Marland Kitchen spironolactone (ALDACTONE) 25 MG tablet Take 25 mg by mouth daily.    Marland Kitchen testosterone cypionate (DEPO-TESTOSTERONE) 200 MG/ML injection Inject 200 mg into the muscle every Thursday.     Marland Kitchen tiZANidine (ZANAFLEX) 4 MG capsule Take 4 mg by mouth 2 (two) times daily as needed for muscle spasms.      No current facility-administered medications for this visit.    Allergies:   Statins   Social History:  The patient  reports that he quit smoking about 42  years ago. His smoking use included Cigarettes. He has never used smokeless tobacco. He reports that he drinks alcohol. He reports that he does not use illicit drugs.   Family History:  The patient's family history includes Heart attack in his maternal uncle; Heart disease in his father, mother, and paternal uncle; Hypertension in his brother, brother, and mother.    ROS:  Please see the history of present illness.   All other systems are reviewed and negative.    PHYSICAL EXAM: VS:  BP 126/78 mmHg  Pulse 72  Ht 6' (1.829 m)  Wt 257 lb 9.6 oz (116.847 kg)  BMI 34.93 kg/m2 , BMI Body mass index is 34.93 kg/(m^2). GEN: Well nourished, well developed, in no acute distress HEENT: normal Neck: no JVD, carotid bruits, or masses Cardiac: RRR; no murmurs, rubs, or gallops,no edema  Respiratory:  clear to auscultation bilaterally, normal work of breathing GI: soft, nontender, nondistended, + BS MS: no deformity or atrophy Skin: warm and dry, device pocket is well healed Neuro:  Strength and sensation are intact Psych: euthymic mood, full affect  Device interrogation is reviewed today in detail.  See PaceArt for details.   Recent Labs: 02/11/2015: Hemoglobin 18.6*; Platelets 163 02/14/2015: ALT 20; BUN 19; Creatinine, Ser 1.31*; Potassium 4.5; Sodium 138; TSH 3.460    Lipid Panel     Component Value Date/Time   CHOL 145 02/14/2015 1151   TRIG 182* 02/14/2015 1151   HDL 34* 02/14/2015  1151   CHOLHDL 4.3 02/14/2015 1151   VLDL 36 02/14/2015 1151   LDLCALC 75 02/14/2015 1151   LDLDIRECT 81.7 12/21/2013 0946     Wt Readings from Last 3 Encounters:  02/16/15 257 lb 9.6 oz (116.847 kg)  02/14/15 255 lb 12.8 oz (116.03 kg)  02/11/15 255 lb 11.7 oz (116 kg)     ASSESSMENT AND PLAN:  1.  Chronic systolic dysfunction Doing well with CRT Normal BiV ICD interrogation today EF has improved  2. afib Well controlled with amiodarone Dr Aundra Dubin to follow LFTs/TFTs Continue eliquis  3. HTN Stable No change required today  I have discussed SJM Fortify Assura advisary with the patient today. He understands that recommendation from SJM is to not replace the device at this time. The patient is ot device dependant.  The patient has not had appropriate device therapy in the past or implanted for secondary prevention.  Vibratory alert demonstrated today.  He is actively remotely monitored and understands the importance of compliance today.   Continue merlin Enroll in ICM device clinic with Margarita Grizzle Return to see EP NP in 1 year  Current medicines are reviewed at length with the patient today.   The patient does not have concerns regarding his medicines.  The following changes were made today:  none  Labs/ tests ordered today include:  No orders of the defined types were placed in this encounter.    Army Fossa, MD  02/16/2015 12:07 PM     [facility] [address] [place] [address] [phone] (office) [phone] (fax)

## 2015-02-16 NOTE — Progress Notes (Signed)
Patient referred by [name] for ICM monthly monitoring.  ICM introduction given during office visit and he agreed to monthly ICM follow up.  Enrollment letter sent with date of ICM transmission.   Patient reported he will have total knee replacement on 02/23/2015 and is worried about fluid overload.  Explained will schedule a transmission a week after surgery to check fluid levels.

## 2015-02-21 MED ORDER — ACETAMINOPHEN 500 MG PO TABS
1000.0000 mg | ORAL_TABLET | Freq: Once | ORAL | Status: AC
Start: 2015-02-22 — End: 2015-02-22
  Administered 2015-02-22: 1000 mg via ORAL
  Filled 2015-02-21: qty 2

## 2015-02-21 MED ORDER — SODIUM CHLORIDE 0.45 % IV SOLN: INTRAVENOUS | Status: DC

## 2015-02-21 MED ORDER — CHLORHEXIDINE GLUCONATE 4 % EX LIQD: 60.0000 mL | Freq: Once | CUTANEOUS | Status: DC

## 2015-02-21 MED ORDER — CEFAZOLIN SODIUM-DEXTROSE 2-3 GM-% IV SOLR
2.0000 g | INTRAVENOUS | Status: AC
  Administered 2015-02-22: 2 g via INTRAVENOUS
  Filled 2015-02-21: qty 50

## 2015-02-22 ENCOUNTER — Inpatient Hospital Stay (HOSPITAL_COMMUNITY)
Admission: RE | Admit: 2015-02-22 | Discharge: 2015-02-23 | DRG: 470 | Disposition: A | Discharge status: Home Health | Payer: Medicare Other | Source: Ambulatory Visit | Attending: Orthopedic Surgery | Admitting: Orthopedic Surgery

## 2015-02-22 ENCOUNTER — Encounter (HOSPITAL_COMMUNITY)
Admission: RE | Disposition: A | Discharge status: Home Health | Payer: Medicare Other | Source: Ambulatory Visit | Attending: Orthopedic Surgery

## 2015-02-22 ENCOUNTER — Encounter (HOSPITAL_COMMUNITY): Payer: Self-pay | Admitting: *Deleted

## 2015-02-22 ENCOUNTER — Inpatient Hospital Stay (HOSPITAL_COMMUNITY): Payer: Medicare Other | Admitting: Vascular Surgery

## 2015-02-22 ENCOUNTER — Inpatient Hospital Stay (HOSPITAL_COMMUNITY): Payer: Medicare Other | Admitting: Anesthesiology

## 2015-02-22 ENCOUNTER — Inpatient Hospital Stay (HOSPITAL_COMMUNITY): Payer: Medicare Other

## 2015-02-22 DIAGNOSIS — G2581 Restless legs syndrome: Secondary | ICD-10-CM | POA: Diagnosis present

## 2015-02-22 DIAGNOSIS — I429 Cardiomyopathy, unspecified: Secondary | ICD-10-CM | POA: Diagnosis present

## 2015-02-22 DIAGNOSIS — E119 Type 2 diabetes mellitus without complications: Secondary | ICD-10-CM | POA: Diagnosis present

## 2015-02-22 DIAGNOSIS — M1990 Unspecified osteoarthritis, unspecified site: Secondary | ICD-10-CM | POA: Diagnosis present

## 2015-02-22 DIAGNOSIS — I447 Left bundle-branch block, unspecified: Secondary | ICD-10-CM | POA: Diagnosis present

## 2015-02-22 DIAGNOSIS — G8929 Other chronic pain: Secondary | ICD-10-CM | POA: Diagnosis present

## 2015-02-22 DIAGNOSIS — Z471 Aftercare following joint replacement surgery: Secondary | ICD-10-CM | POA: Diagnosis not present

## 2015-02-22 DIAGNOSIS — E785 Hyperlipidemia, unspecified: Secondary | ICD-10-CM | POA: Diagnosis present

## 2015-02-22 DIAGNOSIS — I10 Essential (primary) hypertension: Secondary | ICD-10-CM | POA: Diagnosis present

## 2015-02-22 DIAGNOSIS — Z7901 Long term (current) use of anticoagulants: Secondary | ICD-10-CM | POA: Diagnosis not present

## 2015-02-22 DIAGNOSIS — Z8547 Personal history of malignant neoplasm of testis: Secondary | ICD-10-CM | POA: Diagnosis not present

## 2015-02-22 DIAGNOSIS — I480 Paroxysmal atrial fibrillation: Secondary | ICD-10-CM | POA: Diagnosis present

## 2015-02-22 DIAGNOSIS — E669 Obesity, unspecified: Secondary | ICD-10-CM | POA: Diagnosis present

## 2015-02-22 DIAGNOSIS — I481 Persistent atrial fibrillation: Secondary | ICD-10-CM | POA: Diagnosis present

## 2015-02-22 DIAGNOSIS — D6851 Activated protein C resistance: Secondary | ICD-10-CM | POA: Diagnosis present

## 2015-02-22 DIAGNOSIS — M1611 Unilateral primary osteoarthritis, right hip: Principal | ICD-10-CM | POA: Diagnosis present

## 2015-02-22 DIAGNOSIS — Z96641 Presence of right artificial hip joint: Secondary | ICD-10-CM

## 2015-02-22 DIAGNOSIS — M169 Osteoarthritis of hip, unspecified: Secondary | ICD-10-CM | POA: Diagnosis not present

## 2015-02-22 DIAGNOSIS — K219 Gastro-esophageal reflux disease without esophagitis: Secondary | ICD-10-CM | POA: Diagnosis not present

## 2015-02-22 DIAGNOSIS — M545 Low back pain: Secondary | ICD-10-CM | POA: Diagnosis present

## 2015-02-22 DIAGNOSIS — M25551 Pain in right hip: Secondary | ICD-10-CM | POA: Diagnosis not present

## 2015-02-22 HISTORY — DX: Adverse effect of unspecified anesthetic, initial encounter: T41.45XA

## 2015-02-22 HISTORY — PX: TOTAL HIP ARTHROPLASTY: SHX124

## 2015-02-22 LAB — GLUCOSE, CAPILLARY
GLUCOSE-CAPILLARY: 120 mg/dL — AB (ref 65–99)
GLUCOSE-CAPILLARY: 149 mg/dL — AB (ref 65–99)
GLUCOSE-CAPILLARY: 130 mg/dL — AB (ref 65–99)
GLUCOSE-CAPILLARY: 177 mg/dL — AB (ref 65–99)
Glucose-Capillary: 129 mg/dL — ABNORMAL HIGH (ref 65–99)

## 2015-02-22 LAB — CBC
HCT: 46.1 % (ref 39.0–52.0)
Hemoglobin: 14.6 g/dL (ref 13.0–17.0)
MCH: 31.4 pg (ref 26.0–34.0)
MCHC: 31.7 g/dL (ref 30.0–36.0)
MCV: 99.1 fL (ref 78.0–100.0)
PLATELETS: 162 10*3/uL (ref 150–400)
RBC: 4.65 MIL/uL (ref 4.22–5.81)
RDW: 14.5 % (ref 11.5–15.5)
WBC: 13.3 10*3/uL — ABNORMAL HIGH (ref 4.0–10.5)

## 2015-02-22 LAB — PROTIME-INR
INR: 1.04 (ref 0.00–1.49)
Prothrombin Time: 13.8 seconds (ref 11.6–15.2)

## 2015-02-22 SURGERY — ARTHROPLASTY, HIP, TOTAL, ANTERIOR APPROACH
Anesthesia: General | Site: Hip | Laterality: Right

## 2015-02-22 MED ORDER — FENTANYL CITRATE (PF) 250 MCG/5ML IJ SOLN
INTRAMUSCULAR | Status: AC
  Filled 2015-02-22: qty 5

## 2015-02-22 MED ORDER — FENTANYL CITRATE (PF) 100 MCG/2ML IJ SOLN
INTRAMUSCULAR | Status: DC | PRN
  Administered 2015-02-22: 100 ug via INTRAVENOUS
  Administered 2015-02-22 (×5): 50 ug via INTRAVENOUS

## 2015-02-22 MED ORDER — FENTANYL CITRATE (PF) 100 MCG/2ML IJ SOLN
25.0000 ug | INTRAMUSCULAR | Status: DC | PRN
  Administered 2015-02-22 (×2): 50 ug via INTRAVENOUS

## 2015-02-22 MED ORDER — APIXABAN 5 MG PO TABS
5.0000 mg | ORAL_TABLET | Freq: Two times a day (BID) | ORAL | Status: DC
  Administered 2015-02-22 – 2015-02-23 (×3): 5 mg via ORAL
  Filled 2015-02-22 (×3): qty 1

## 2015-02-22 MED ORDER — OXYCODONE HCL 5 MG PO TABS: 5.0000 mg | ORAL_TABLET | ORAL | Status: DC | PRN

## 2015-02-22 MED ORDER — 0.9 % SODIUM CHLORIDE (POUR BTL) OPTIME
TOPICAL | Status: DC | PRN
  Administered 2015-02-22: 1000 mL

## 2015-02-22 MED ORDER — ONDANSETRON HCL 4 MG PO TABS: 4.0000 mg | ORAL_TABLET | Freq: Four times a day (QID) | ORAL | Status: DC | PRN

## 2015-02-22 MED ORDER — ROCURONIUM BROMIDE 100 MG/10ML IV SOLN
INTRAVENOUS | Status: DC | PRN
  Administered 2015-02-22: 50 mg via INTRAVENOUS

## 2015-02-22 MED ORDER — METOCLOPRAMIDE HCL 5 MG PO TABS: 5.0000 mg | ORAL_TABLET | Freq: Three times a day (TID) | ORAL | Status: DC | PRN

## 2015-02-22 MED ORDER — ROPINIROLE HCL 0.5 MG PO TABS
0.5000 mg | ORAL_TABLET | Freq: Every day | ORAL | Status: DC | PRN
  Filled 2015-02-22: qty 1

## 2015-02-22 MED ORDER — METOCLOPRAMIDE HCL 5 MG/ML IJ SOLN: 5.0000 mg | Freq: Three times a day (TID) | INTRAMUSCULAR | Status: DC | PRN

## 2015-02-22 MED ORDER — ACETAMINOPHEN 650 MG RE SUPP: 650.0000 mg | Freq: Four times a day (QID) | RECTAL | Status: DC | PRN

## 2015-02-22 MED ORDER — TESTOSTERONE CYPIONATE 200 MG/ML IM SOLN: 200.0000 mg | INTRAMUSCULAR | Status: DC

## 2015-02-22 MED ORDER — BUPIVACAINE LIPOSOME 1.3 % IJ SUSP
20.0000 mL | INTRAMUSCULAR | Status: DC
  Filled 2015-02-22: qty 20

## 2015-02-22 MED ORDER — AMIODARONE HCL 200 MG PO TABS
200.0000 mg | ORAL_TABLET | Freq: Every day | ORAL | Status: DC
Start: 2015-02-23 — End: 2015-02-23
  Administered 2015-02-23: 200 mg via ORAL
  Filled 2015-02-22: qty 1

## 2015-02-22 MED ORDER — METFORMIN HCL 500 MG PO TABS
1000.0000 mg | ORAL_TABLET | Freq: Two times a day (BID) | ORAL | Status: DC
  Administered 2015-02-22 – 2015-02-23 (×2): 1000 mg via ORAL
  Filled 2015-02-22 (×2): qty 2

## 2015-02-22 MED ORDER — CELECOXIB 200 MG PO CAPS
200.0000 mg | ORAL_CAPSULE | Freq: Two times a day (BID) | ORAL | Status: DC
  Administered 2015-02-22: 200 mg via ORAL
  Filled 2015-02-22 (×2): qty 1

## 2015-02-22 MED ORDER — SILDENAFIL CITRATE 100 MG PO TABS: 100.0000 mg | ORAL_TABLET | Freq: Every day | ORAL | Status: DC | PRN

## 2015-02-22 MED ORDER — DICLOFENAC EPOLAMINE 1.3 % TD PTCH
1.0000 | MEDICATED_PATCH | Freq: Two times a day (BID) | TRANSDERMAL | Status: DC | PRN
  Filled 2015-02-22: qty 1

## 2015-02-22 MED ORDER — ACETAMINOPHEN 325 MG PO TABS: 650.0000 mg | ORAL_TABLET | Freq: Four times a day (QID) | ORAL | Status: DC | PRN

## 2015-02-22 MED ORDER — ROPINIROLE HCL 1 MG PO TABS
1.0000 mg | ORAL_TABLET | Freq: Every day | ORAL | Status: DC
  Administered 2015-02-22: 1 mg via ORAL
  Filled 2015-02-22: qty 1

## 2015-02-22 MED ORDER — POTASSIUM CHLORIDE IN NACL 20-0.45 MEQ/L-% IV SOLN
INTRAVENOUS | Status: DC
  Administered 2015-02-22 – 2015-02-23 (×3): via INTRAVENOUS
  Filled 2015-02-22 (×5): qty 1000

## 2015-02-22 MED ORDER — PANTOPRAZOLE SODIUM 40 MG PO TBEC
40.0000 mg | DELAYED_RELEASE_TABLET | Freq: Every day | ORAL | Status: DC
  Administered 2015-02-22 – 2015-02-23 (×2): 40 mg via ORAL
  Filled 2015-02-22 (×2): qty 1

## 2015-02-22 MED ORDER — FUROSEMIDE 20 MG PO TABS
20.0000 mg | ORAL_TABLET | Freq: Every day | ORAL | Status: DC
  Administered 2015-02-22 – 2015-02-23 (×2): 20 mg via ORAL
  Filled 2015-02-22 (×2): qty 1

## 2015-02-22 MED ORDER — DEXAMETHASONE SODIUM PHOSPHATE 10 MG/ML IJ SOLN
10.0000 mg | Freq: Once | INTRAMUSCULAR | Status: AC
  Administered 2015-02-23: 10 mg via INTRAVENOUS
  Filled 2015-02-22: qty 1

## 2015-02-22 MED ORDER — MIDAZOLAM HCL 2 MG/2ML IJ SOLN
INTRAMUSCULAR | Status: AC
  Filled 2015-02-22: qty 2

## 2015-02-22 MED ORDER — FENTANYL CITRATE (PF) 100 MCG/2ML IJ SOLN
INTRAMUSCULAR | Status: AC
  Administered 2015-02-22: 50 ug via INTRAVENOUS
  Filled 2015-02-22: qty 2

## 2015-02-22 MED ORDER — OXYCODONE HCL 5 MG PO TABS
5.0000 mg | ORAL_TABLET | ORAL | Status: DC | PRN
  Administered 2015-02-22 – 2015-02-23 (×7): 10 mg via ORAL
  Filled 2015-02-22 (×6): qty 2

## 2015-02-22 MED ORDER — GABAPENTIN 300 MG PO CAPS
600.0000 mg | ORAL_CAPSULE | Freq: Every evening | ORAL | Status: DC
  Administered 2015-02-22: 600 mg via ORAL
  Filled 2015-02-22: qty 2

## 2015-02-22 MED ORDER — ALBUMIN HUMAN 5 % IV SOLN
INTRAVENOUS | Status: DC | PRN
  Administered 2015-02-22: 08:00:00 via INTRAVENOUS

## 2015-02-22 MED ORDER — MAGNESIUM OXIDE 400 (241.3 MG) MG PO TABS
400.0000 mg | ORAL_TABLET | Freq: Every morning | ORAL | Status: DC
  Administered 2015-02-22 – 2015-02-23 (×2): 400 mg via ORAL
  Filled 2015-02-22 (×2): qty 1

## 2015-02-22 MED ORDER — MIDAZOLAM HCL 5 MG/5ML IJ SOLN
INTRAMUSCULAR | Status: DC | PRN
  Administered 2015-02-22: 2 mg via INTRAVENOUS

## 2015-02-22 MED ORDER — PHENOL 1.4 % MT LIQD: 1.0000 | OROMUCOSAL | Status: DC | PRN

## 2015-02-22 MED ORDER — DOCUSATE SODIUM 100 MG PO CAPS
100.0000 mg | ORAL_CAPSULE | Freq: Two times a day (BID) | ORAL | Status: DC
  Administered 2015-02-22 – 2015-02-23 (×3): 100 mg via ORAL
  Filled 2015-02-22 (×3): qty 1

## 2015-02-22 MED ORDER — OMEGA-3-ACID ETHYL ESTERS 1 G PO CAPS
1.0000 g | ORAL_CAPSULE | Freq: Two times a day (BID) | ORAL | Status: DC
  Administered 2015-02-22 – 2015-02-23 (×2): 1 g via ORAL
  Filled 2015-02-22 (×2): qty 1

## 2015-02-22 MED ORDER — CEFAZOLIN SODIUM-DEXTROSE 2-3 GM-% IV SOLR
2.0000 g | Freq: Four times a day (QID) | INTRAVENOUS | Status: AC
  Administered 2015-02-22 (×2): 2 g via INTRAVENOUS
  Filled 2015-02-22 (×2): qty 50

## 2015-02-22 MED ORDER — LACTATED RINGERS IV SOLN
INTRAVENOUS | Status: DC | PRN
  Administered 2015-02-22: 07:00:00 via INTRAVENOUS

## 2015-02-22 MED ORDER — LOVASTATIN ER 40 MG PO TB24
40.0000 mg | ORAL_TABLET | Freq: Every day | ORAL | Status: DC
  Administered 2015-02-22: 40 mg via ORAL
  Filled 2015-02-22 (×2): qty 1

## 2015-02-22 MED ORDER — LISINOPRIL 10 MG PO TABS: 10.0000 mg | ORAL_TABLET | Freq: Every evening | ORAL | Status: DC

## 2015-02-22 MED ORDER — PHENYLEPHRINE HCL 10 MG/ML IJ SOLN
INTRAMUSCULAR | Status: DC | PRN
  Administered 2015-02-22 (×3): 80 ug via INTRAVENOUS

## 2015-02-22 MED ORDER — PHENYLEPHRINE HCL 10 MG/ML IJ SOLN
10.0000 mg | INTRAVENOUS | Status: DC | PRN
  Administered 2015-02-22: 50 ug/min via INTRAVENOUS

## 2015-02-22 MED ORDER — SPIRONOLACTONE 25 MG PO TABS
25.0000 mg | ORAL_TABLET | Freq: Every day | ORAL | Status: DC
  Administered 2015-02-22 – 2015-02-23 (×2): 25 mg via ORAL
  Filled 2015-02-22 (×2): qty 1

## 2015-02-22 MED ORDER — ONDANSETRON HCL 4 MG/2ML IJ SOLN: 4.0000 mg | Freq: Once | INTRAMUSCULAR | Status: DC | PRN

## 2015-02-22 MED ORDER — TIZANIDINE HCL 4 MG PO TABS
4.0000 mg | ORAL_TABLET | Freq: Two times a day (BID) | ORAL | Status: DC | PRN
Start: 2015-02-22 — End: 2015-02-23
  Filled 2015-02-22: qty 1

## 2015-02-22 MED ORDER — OXYCODONE HCL 5 MG PO TABS
ORAL_TABLET | ORAL | Status: AC
  Administered 2015-02-22: 10 mg via ORAL
  Filled 2015-02-22: qty 2

## 2015-02-22 MED ORDER — CARVEDILOL 12.5 MG PO TABS
12.5000 mg | ORAL_TABLET | Freq: Two times a day (BID) | ORAL | Status: DC
  Filled 2015-02-22: qty 1

## 2015-02-22 MED ORDER — PROPOFOL 10 MG/ML IV BOLUS
INTRAVENOUS | Status: AC
  Filled 2015-02-22: qty 20

## 2015-02-22 MED ORDER — ONDANSETRON HCL 4 MG/2ML IJ SOLN: 4.0000 mg | Freq: Four times a day (QID) | INTRAMUSCULAR | Status: DC | PRN

## 2015-02-22 MED ORDER — PROPOFOL 10 MG/ML IV BOLUS
INTRAVENOUS | Status: DC | PRN
  Administered 2015-02-22: 200 mg via INTRAVENOUS

## 2015-02-22 MED ORDER — PHENYLEPHRINE 40 MCG/ML (10ML) SYRINGE FOR IV PUSH (FOR BLOOD PRESSURE SUPPORT)
PREFILLED_SYRINGE | INTRAVENOUS | Status: AC
  Filled 2015-02-22: qty 10

## 2015-02-22 MED ORDER — VITAMIN D3 25 MCG (1000 UNIT) PO TABS
1000.0000 [IU] | ORAL_TABLET | Freq: Every morning | ORAL | Status: DC
  Administered 2015-02-22 – 2015-02-23 (×2): 1000 [IU] via ORAL
  Filled 2015-02-22 (×3): qty 1

## 2015-02-22 MED ORDER — ONDANSETRON HCL 4 MG/2ML IJ SOLN
INTRAMUSCULAR | Status: DC | PRN
  Administered 2015-02-22: 4 mg via INTRAVENOUS

## 2015-02-22 MED ORDER — DOCUSATE SODIUM 100 MG PO CAPS
100.0000 mg | ORAL_CAPSULE | Freq: Two times a day (BID) | ORAL | Status: DC
Start: 2015-02-22 — End: 2015-12-09

## 2015-02-22 MED ORDER — MENTHOL 3 MG MT LOZG: 1.0000 | LOZENGE | OROMUCOSAL | Status: DC | PRN

## 2015-02-22 MED ORDER — POLYETHYLENE GLYCOL 3350 17 G PO PACK: 17.0000 g | PACK | Freq: Every day | ORAL | Status: DC | PRN

## 2015-02-22 MED ORDER — LIDOCAINE HCL (CARDIAC) 20 MG/ML IV SOLN
INTRAVENOUS | Status: DC | PRN
  Administered 2015-02-22: 60 mg via INTRAVENOUS

## 2015-02-22 MED ORDER — HYDROMORPHONE HCL 1 MG/ML IJ SOLN
1.0000 mg | INTRAMUSCULAR | Status: DC | PRN
  Administered 2015-02-22 – 2015-02-23 (×2): 1 mg via INTRAVENOUS
  Filled 2015-02-22 (×2): qty 1

## 2015-02-22 MED ORDER — LOVASTATIN ER 40 MG PO TB24: 40.0000 mg | ORAL_TABLET | Freq: Every day | ORAL | Status: DC

## 2015-02-22 MED ORDER — ONDANSETRON HCL 4 MG PO TABS: 4.0000 mg | ORAL_TABLET | Freq: Three times a day (TID) | ORAL | Status: DC | PRN

## 2015-02-22 SURGICAL SUPPLY — 56 items
BAG DECANTER FOR FLEXI CONT (MISCELLANEOUS) IMPLANT
BLADE SAW SGTL 18X1.27X75 (BLADE) ×2 IMPLANT
BLADE SAW SGTL 18X1.27X75MM (BLADE) ×1
BLADE SURG ROTATE 9660 (MISCELLANEOUS) IMPLANT
BNDG COHESIVE 4X5 TAN STRL (GAUZE/BANDAGES/DRESSINGS) ×3 IMPLANT
CAPT HIP TOTAL 2 ×3 IMPLANT
CLOSURE STERI-STRIP 1/2X4 (GAUZE/BANDAGES/DRESSINGS)
CLSR STERI-STRIP ANTIMIC 1/2X4 (GAUZE/BANDAGES/DRESSINGS) IMPLANT
COVER SURGICAL LIGHT HANDLE (MISCELLANEOUS) ×3 IMPLANT
DRAPE C-ARM 42X72 X-RAY (DRAPES) IMPLANT
DRAPE INCISE IOBAN 66X45 STRL (DRAPES) ×3 IMPLANT
DRAPE ORTHO SPLIT 77X108 STRL (DRAPES) ×4
DRAPE SURG ORHT 6 SPLT 77X108 (DRAPES) ×2 IMPLANT
DRAPE U-SHAPE 47X51 STRL (DRAPES) ×6 IMPLANT
DRSG MEPILEX BORDER 4X8 (GAUZE/BANDAGES/DRESSINGS) ×3 IMPLANT
DURAPREP 26ML APPLICATOR (WOUND CARE) ×3 IMPLANT
ELECT BLADE 4.0 EZ CLEAN MEGAD (MISCELLANEOUS) ×3
ELECT REM PT RETURN 9FT ADLT (ELECTROSURGICAL) ×3
ELECTRODE BLDE 4.0 EZ CLN MEGD (MISCELLANEOUS) ×1 IMPLANT
ELECTRODE REM PT RTRN 9FT ADLT (ELECTROSURGICAL) ×1 IMPLANT
FACESHIELD WRAPAROUND (MASK) ×6 IMPLANT
GLOVE BIO SURGEON STRL SZ7 (GLOVE) ×3 IMPLANT
GLOVE BIO SURGEON STRL SZ7.5 (GLOVE) ×3 IMPLANT
GLOVE BIOGEL PI IND STRL 7.0 (GLOVE) ×1 IMPLANT
GLOVE BIOGEL PI IND STRL 8 (GLOVE) ×1 IMPLANT
GLOVE BIOGEL PI INDICATOR 7.0 (GLOVE) ×2
GLOVE BIOGEL PI INDICATOR 8 (GLOVE) ×2
GLOVE SURG SS PI 7.5 STRL IVOR (GLOVE) ×3 IMPLANT
GLOVE SURG SS PI 8.0 STRL IVOR (GLOVE) ×3 IMPLANT
GOWN STRL REUS W/ TWL LRG LVL3 (GOWN DISPOSABLE) ×2 IMPLANT
GOWN STRL REUS W/ TWL XL LVL3 (GOWN DISPOSABLE) ×1 IMPLANT
GOWN STRL REUS W/TWL LRG LVL3 (GOWN DISPOSABLE) ×4
GOWN STRL REUS W/TWL XL LVL3 (GOWN DISPOSABLE) ×2
KIT BASIN OR (CUSTOM PROCEDURE TRAY) ×3 IMPLANT
KIT ROOM TURNOVER OR (KITS) ×3 IMPLANT
MANIFOLD NEPTUNE II (INSTRUMENTS) ×3 IMPLANT
NDL SAFETY ECLIPSE 18X1.5 (NEEDLE) IMPLANT
NEEDLE 18GX1X1/2 (RX/OR ONLY) (NEEDLE) IMPLANT
NEEDLE HYPO 18GX1.5 SHARP (NEEDLE)
NS IRRIG 1000ML POUR BTL (IV SOLUTION) ×3 IMPLANT
PACK TOTAL JOINT (CUSTOM PROCEDURE TRAY) ×3 IMPLANT
PACK UNIVERSAL I (CUSTOM PROCEDURE TRAY) ×3 IMPLANT
PAD ARMBOARD 7.5X6 YLW CONV (MISCELLANEOUS) ×3 IMPLANT
SPONGE LAP 18X18 X RAY DECT (DISPOSABLE) IMPLANT
SUT MNCRL AB 4-0 PS2 18 (SUTURE) ×3 IMPLANT
SUT MON AB 2-0 CT1 36 (SUTURE) ×3 IMPLANT
SUT VIC AB 0 CT1 27 (SUTURE) ×2
SUT VIC AB 0 CT1 27XBRD ANBCTR (SUTURE) ×1 IMPLANT
SUT VIC AB 1 CT1 27 (SUTURE) ×2
SUT VIC AB 1 CT1 27XBRD ANBCTR (SUTURE) ×1 IMPLANT
SYR 50ML LL SCALE MARK (SYRINGE) IMPLANT
SYRINGE 20CC LL (MISCELLANEOUS) IMPLANT
TOWEL OR 17X24 6PK STRL BLUE (TOWEL DISPOSABLE) ×3 IMPLANT
TOWEL OR 17X26 10 PK STRL BLUE (TOWEL DISPOSABLE) ×3 IMPLANT
TRAY FOLEY CATH 16FRSI W/METER (SET/KITS/TRAYS/PACK) IMPLANT
WATER STERILE IRR 1000ML POUR (IV SOLUTION) IMPLANT

## 2015-02-22 NOTE — Anesthesia Postprocedure Evaluation (Signed)
Anesthesia Post Note  Patient: Walter [name]  Procedure(s) Performed: Procedure(s) (LRB): RIGHT TOTAL HIP ARTHROPLASTY ANTERIOR APPROACH (Right)  Patient location during evaluation: PACU Anesthesia Type: General Level of consciousness: awake and awake and alert Pain management: pain level controlled Vital Signs Assessment: post-procedure vital signs reviewed and stable Anesthetic complications: no    Last Vitals:  Filed Vitals:   02/22/15 1350 02/22/15 1358  BP: 67/40 58/42  Pulse: 77   Temp: 37.1 C   Resp: 18     Last Pain:  Filed Vitals:   02/22/15 1458  PainSc: 5                  Ayleen Mckinstry COKER

## 2015-02-22 NOTE — Discharge Instructions (Signed)
INSTRUCTIONS AFTER JOINT REPLACEMENT  ° °o Remove items at home which could result in a fall. This includes throw rugs or furniture in walking pathways °o ICE to the affected joint every three hours while awake for 30 minutes at a time, for at least the first 3-5 days, and then as needed for pain and swelling.  Continue to use ice for pain and swelling. You may notice swelling that will progress down to the foot and ankle.  This is normal after surgery.  Elevate your leg when you are not up walking on it.   °o Continue to use the breathing machine you got in the hospital (incentive spirometer) which will help keep your temperature down.  It is common for your temperature to cycle up and down following surgery, especially at night when you are not up moving around and exerting yourself.  The breathing machine keeps your lungs expanded and your temperature down. ° ° °DIET:  As you were doing prior to hospitalization, we recommend a well-balanced diet. ° °DRESSING / WOUND CARE / SHOWERING ° °Keep the surgical dressing until follow up.  IF THE DRESSING FALLS OFF or the wound gets wet inside, change the dressing with sterile gauze.  Please use good hand washing techniques before changing the dressing.  Do not use any lotions or creams on the incision until instructed by your surgeon.   ° °ACTIVITY ° °o Increase activity slowly as tolerated, but follow the weight bearing instructions below.   °o No driving for 6 weeks or until further direction given by your physician.  You cannot drive while taking narcotics.  °o No lifting or carrying greater than 10 lbs. until further directed by your surgeon. °o Avoid periods of inactivity such as sitting longer than an hour when not asleep. This helps prevent blood clots.  °o You may return to work once you are authorized by your doctor.  ° ° °WEIGHT BEARING  ° °Weight bearing as tolerated with assist device (walker, cane, etc) as directed, use it as long as suggested by your surgeon  or therapist, typically at least 4-6 weeks. ° ° °CONSTIPATION ° °Constipation is defined medically as fewer than three stools per week and severe constipation as less than one stool per week.  Even if you have a regular bowel pattern at home, your normal regimen is likely to be disrupted due to multiple reasons following surgery.  Combination of anesthesia, postoperative narcotics, change in appetite and fluid intake all can affect your bowels.  ° °YOU MUST use at least one of the following options; they are listed in order of increasing strength to get the job done.  They are all available over the counter, and you may need to use some, POSSIBLY even all of these options:   ° °Drink plenty of fluids (prune juice may be helpful) and high fiber foods °Colace 100 mg by mouth twice a day  °Senokot for constipation as directed and as needed Dulcolax (bisacodyl), take with full glass of water  °Miralax (polyethylene glycol) once or twice a day as needed. ° °If you have tried all these things and are unable to have a bowel movement in the first 3-4 days after surgery call either your surgeon or your primary doctor.   ° °If you experience loose stools or diarrhea, hold the medications until you stool forms back up.  If your symptoms do not get better within 1 week or if they get worse, check with your doctor.  If   If you experience "the worst abdominal pain ever" or develop nausea or vomiting, please contact the office immediately for further recommendations for treatment. ° ° °ITCHING:  If you experience itching with your medications, try taking only a single pain pill, or even half a pain pill at a time.  You can also use Benadryl over the counter for itching or also to help with sleep.  ° °TED HOSE STOCKINGS:  Use stockings on both legs until for at least 2 weeks or as directed by physician office. They may be removed at night for sleeping. ° °MEDICATIONS:  See your medication summary on the “After Visit Summary”  that nursing will review with you.  You may have some home medications which will be placed on hold until you complete the course of blood thinner medication.  It is important for you to complete the blood thinner medication as prescribed. ° °PRECAUTIONS:  If you experience chest pain or shortness of breath - call 911 immediately for transfer to the hospital emergency department.  ° °If you develop a fever greater that 101 F, purulent drainage from wound, increased redness or drainage from wound, foul odor from the wound/dressing, or calf pain - CONTACT YOUR SURGEON.   °                                                °FOLLOW-UP APPOINTMENTS:  If you do not already have a post-op appointment, please call the office for an appointment to be seen by your surgeon.  Guidelines for how soon to be seen are listed in your “After Visit Summary”, but are typically between 1-4 weeks after surgery. ° °OTHER INSTRUCTIONS:  ° °MAKE SURE YOU:  °• Understand these instructions.  °• Get help right away if you are not doing well or get worse.  ° ° °Thank you for letting us be a part of your medical care team.  It is a privilege we respect greatly.  We hope these instructions will help you stay on track for a fast and full recovery!  ° °

## 2015-02-22 NOTE — Evaluation (Signed)
Physical Therapy Evaluation Patient Details Name: Walter [name] MRN: [id number] DOB: 06-12-1945 Today's Date: 02/22/2015   History of Present Illness  Pt admitted for R direct anterior THA with PMHx of Afib, LBBB, NICM, and restless leg syndrome  Clinical Impression  Pt demonstrated good affect throughout eval. He presents with decreased ROM, strength, balance, and functional mobility. PT noticed bleeding from incision site during bed mobility. Apparent fluid collection at the site of incision was also observed and nurse was notified. Upon inspection, RN asked to hold Tx until MD could evaluate incision site. Education was provided to pt and family on HEP, transferring with staff, and POC. Pt would benefit from acute PT to improve activity tolerance, functional mobility, and safety with transfers to decrease burden of care. D/C to home with HHPT is most appropriate at this time, pt and family agreeable to [place].     Follow Up Recommendations Home health PT    Equipment Recommendations  None recommended by PT    Recommendations for Other Services       Precautions / Restrictions Precautions Precautions: Fall Precaution Comments: no precautions anterior hip Restrictions Weight Bearing Restrictions: Yes RLE Weight Bearing: Weight bearing as tolerated      Mobility  Bed Mobility Overal bed mobility: Needs Assistance Bed Mobility: Supine to Sit     Supine to sit: Min assist     General bed mobility comments: Min assist for trunk elevation and moving R LE. Increased time and use of rails to pivot. VC to breathe and push from bed to help move.   Transfers Overall transfer level: Needs assistance Equipment used: Rolling walker (2 wheeled) Transfers: Sit to/from [facility] Sit to Stand: Min guard Stand pivot transfers: Min guard       General transfer comment: Min guard for stability. VCs for hand placement, sequencing, extension of trunk when  standing  Ambulation/Gait             General Gait Details: Apparent hematoma formation near incision site; RN was notified, evaluated it during session and asked to hold ambulation.   Stairs            Wheelchair Mobility    Modified Rankin (Stroke Patients Only)       Balance Overall balance assessment: Needs assistance Sitting-balance support: Feet supported Sitting balance-Leahy Scale: Good     Standing balance support: Bilateral upper extremity supported Standing balance-Leahy Scale: Poor Standing balance comment: use of RW                             Pertinent Vitals/Pain Pain Assessment: No/denies pain    Home Living Family/patient expects to be discharged to:: Private residence Living Arrangements: Spouse/significant other Available Help at Discharge: Family Type of Home: House       Home Layout: Multi-level (Pt will remain on ground level) Home Equipment: Walker - 2 wheels;Grab bars - tub/shower;Bedside commode      Prior Function Level of Independence: Independent               Hand Dominance   Dominant Hand: Left    Extremity/Trunk Assessment                         Communication   Communication: No difficulties  Cognition Arousal/Alertness: Awake/alert Behavior During Therapy: WFL for tasks assessed/performed Overall Cognitive Status: Within Functional Limits for tasks assessed  General Comments      Exercises Total Joint Exercises Ankle Circles/Pumps: AROM;Seated;10 reps;Both Gluteal Sets: Seated;Both;10 reps;Strengthening Long Arc Quad: Strengthening;AROM;Seated;Both;10 reps Marching in Standing: AROM;Strengthening;Seated;10 reps;Both      Assessment/Plan    PT Assessment Patient needs continued PT services  PT Diagnosis Difficulty walking;Generalized weakness;Acute pain   PT Problem List Decreased strength;Decreased range of motion;Decreased activity  tolerance;Decreased balance;Decreased mobility;Decreased knowledge of use of DME;Decreased safety awareness;Pain  PT Treatment Interventions DME instruction;Gait training;Functional mobility training;Therapeutic exercise;Patient/family education   PT Goals (Current goals can be found in the Care Plan section) Acute Rehab PT Goals Patient Stated Goal: Return to home without pain PT Goal Formulation: With patient/family Time For Goal Achievement: 03/01/15 Potential to Achieve Goals: Good    Frequency 7X/week   Barriers to discharge        Co-evaluation               End of Session Equipment Utilized During Treatment: Gait belt Activity Tolerance: Treatment limited secondary to medical complications (Comment) (apparent hematoma formation at surgical site, RN evaluated site and asked to hold Tx) Patient left: in chair;with family/visitor present;with call bell/phone within reach Nurse Communication: Mobility status;Weight bearing status         Time: [id number] PT Time Calculation (min) (ACUTE ONLY): 24 min   Charges:   PT Evaluation $Initial PT Evaluation Tier I: 1 Procedure PT Treatments $Therapeutic Exercise: 8-22 mins   PT G CodesHaynes Bast 03-06-2015, 1:32 PM Haynes Bast, SPT 03-06-2015 1:37 PM

## 2015-02-22 NOTE — Anesthesia Procedure Notes (Signed)
Procedure Name: Intubation Date/Time: 02/22/2015 7:40 AM Performed by: Manus Gunning, Daemon Dowty J Pre-anesthesia Checklist: Patient identified, Timeout performed, Emergency Drugs available, Suction available and Patient being monitored Patient Re-evaluated:Patient Re-evaluated prior to inductionOxygen Delivery Method: Circle system utilized Preoxygenation: Pre-oxygenation with 100% oxygen Intubation Type: IV induction Ventilation: Mask ventilation without difficulty Laryngoscope Size: Mac and 4 Grade View: Grade II Tube type: Oral Tube size: 7.5 mm Number of attempts: 1 Placement Confirmation: ETT inserted through vocal cords under direct vision,  breath sounds checked- equal and bilateral and positive ETCO2 Secured at: 22 cm Tube secured with: Tape Dental Injury: Teeth and Oropharynx as per pre-operative assessment

## 2015-02-22 NOTE — Significant Event (Signed)
Rapid Response Event Note  Overview: Time Called: 1356 Arrival Time: 1400 Event Type: Hypotension  Initial Focused Assessment:  Called by primary RN for patient recently admitted from PACU with low BP.  Patient had right hip surgery, was working with PT and got into chair and c/o of feeling lightheaded and dizzy.  BP checked at that time 67/50 and manual checked 58/42.  Upon my arrival to patients room, Family and RN at bedside.  Patient sitting in chair, alert and oriented, slightly lethargic, pale on room air.  BP on my arrival 73/44 HR 70, skin warm and dry.  MD has been paged.  Patient has not received any pain meds since arriving to floor.     Interventions:  500 CC NS bolus started as per protocol.  PA at bedside, ace wrap applied to right leg by PA.  BP rechecked 96/54 HR 68.  Patients color is improving and states he is starting to feel better.  Patient request to stay in chair as this is more comfortable for him.     Event Summary:  Rn to call if assistance needed   at      at          [facility], Harlin Rain

## 2015-02-22 NOTE — Op Note (Signed)
02/22/2015  9:48 AM  PATIENT:  Walter [name]   MRN: [id number]  PRE-OPERATIVE DIAGNOSIS:  OA RIGHT HIP  POST-OPERATIVE DIAGNOSIS:  SAME  PROCEDURE:  Procedure(s): RIGHT TOTAL HIP ARTHROPLASTY ANTERIOR APPROACH  PREOPERATIVE INDICATIONS:    Walter [name] is an 69 y.o. male who has a diagnosis of <principal problem not specified> and elected for surgical management after failing conservative treatment.  The risks benefits and alternatives were discussed with the patient including but not limited to the risks of nonoperative treatment, versus surgical intervention including infection, bleeding, nerve injury, periprosthetic fracture, the need for revision surgery, dislocation, leg length discrepancy, blood clots, cardiopulmonary complications, morbidity, mortality, among others, and they were willing to proceed.     OPERATIVE REPORT     SURGEON:   Walter [name], Walter Amble, MD    ASSISTANT:  Walter Calender, PA-C, Walter [name] was present and scrubbed throughout the case, critical for completion in a timely fashion, and for retraction, instrumentation, and closure.     ANESTHESIA:  General    COMPLICATIONS:  None.     COMPONENTS:  Stryker anato fit femur size 6 with a 36 mm +5 head ball and a PSL acetabular shell size 58 with a  polyethylene liner    PROCEDURE IN DETAIL:   The patient was met in the holding area and  identified.  The appropriate hip was identified and marked at the operative site.  The patient was then transported to the OR  and  placed under general anesthesia.  At that point, the patient was  placed in the supine position and  secured to the operating room table and all bony prominences padded. He received pre-operative antibiotics    The operative lower extremity was prepped from the iliac crest to the distal leg.  Sterile draping was performed.  Time out was performed prior to incision.      Skin incision was made just 2 cm lateral to the ASIS  extending in line with the tensor  fascia lata. Electrocautery was used to control all bleeders. I dissected down sharply to the fascia of the tensor fascia lata was confirmed that the muscle fibers beneath were running posteriorly. I then incised the fascia over the superficial tensor fascia lata in line with the incision. The fascia was elevated off the anterior aspect of the muscle the muscle was retracted posteriorly and protected throughout the case. I then used electrocautery to incise the tensor fascia lata fascia control and all bleeders. Immediately visible was the fat over top of the anterior neck and capsule.  I removed the anterior fat from the capsule and elevated the rectus muscle off of the anterior capsule. I then removed a large time of capsule. The retractors were then placed over the anterior acetabulum as well as around the superior and inferior neck.  I then removed a section of the femoral neck and a napkin ring fashion. Then used the power course to remove the femoral head from the acetabulum and thoroughly irrigated the acetabulum. I sized the femoral head.    I then exposed the deep acetabulum, cleared out any tissue including the ligamentum teres.   After adequate visualization, I excised the labrum, and then sequentially reamed.  I placed the trial acetabulum, which seated nicely, and then impacted the real cup into place.  Appropriate version and inclination was confirmed clinically matching their bony anatomy, and also with the use transverse acetabular ligament.  I placed a 37m screw in the posterior/superio position with an excellent  bite.    I then placed the polyethylene liner in place  I then abducted the leg and released the external rotators from the posterior femur allowing it to be easily delivered up lateral and anterior to the acetabulum for preparation of the femoral canal.    I then prepared the proximal femur using the cookie-cutter and then sequentially reamed and broached.  A trial broach,  neck, and head was utilized, and I reduced the hip and it was found to have excellent stability with functional range of motion..  I then impacted the real femoral prosthesis into place into the appropriate version, slightly anteverted to the normal anatomy, and I impacted the real head ball into place. The hip was then reduced and taken through functional range of motion and found to have excellent stability. Leg lengths were restored.  I then irrigated the hip copiously again with, and repaired the fascia with Vicryl, followed by monocryl for the subcutaneous tissue, Monocryl for the skin, Steri-Strips and sterile gauze. The patient was then awakened and returned to PACU in stable and satisfactory condition. There were no complications.  POST OPERATIVE PLAN: WBAT, DVT px: SCD's/TED and chemical px  Walter Lynch, MD Orthopedic Surgeon [phone]   This note was generated using a template and dragon dictation system. In light of that, I have reviewed the note and all aspects of it are applicable to this case. Any dictation errors are due to the computerized dictation system.

## 2015-02-22 NOTE — Interval H&P Note (Signed)
History and Physical Interval Note:  02/22/2015 7:24 AM  Walter [name]  has presented today for surgery, with the diagnosis of OA RIGHT HIP  The various methods of treatment have been discussed with the patient and family. After consideration of risks, benefits and other options for treatment, the patient has consented to  Procedure(s): RIGHT TOTAL HIP ARTHROPLASTY ANTERIOR APPROACH (Right) as a surgical intervention .  The patient's history has been reviewed, patient examined, no change in status, stable for surgery.  I have reviewed the patient's chart and labs.  Questions were answered to the patient's satisfaction.     [name]

## 2015-02-22 NOTE — Progress Notes (Signed)
PT called RN in room because incision was bleeding. RN went to assess wound and noticed large amount of swelling at incision site appearing to be hematoma. Notified PA of this. Once pt got to chair, stated that he felt lightheaded and dizzy. BP 67/40 sitting in chair, manually 58/42. Pt was very pale. Rapid Response called, and PA notified again. Started fluid bolus per RR RN, BPs slowly increasing. PA applied ace wrap to RLE from foot to thigh. Pt's color returned over period of 20 minutes, stating he feels much better. Will continue to monitor for changes.   Rockwell, Jerry Caras

## 2015-02-22 NOTE — Anesthesia Preprocedure Evaluation (Signed)
Anesthesia Evaluation  Patient identified by MRN, date of birth, ID band Patient awake    Reviewed: Allergy & Precautions, NPO status , Patient's Chart, lab work & pertinent test results  Airway Mallampati: II  TM Distance: >3 FB Neck ROM: Full    Dental  (+) Teeth Intact, Dental Advisory Given   Pulmonary former smoker,    breath sounds clear to auscultation       Cardiovascular hypertension,  Rhythm:Regular Rate:Normal     Neuro/Psych    GI/Hepatic   Endo/Other  diabetes  Renal/GU      Musculoskeletal   Abdominal   Peds  Hematology   Anesthesia Other Findings   Reproductive/Obstetrics                             Anesthesia Physical Anesthesia Plan  ASA: III  Anesthesia Plan: General   Post-op Pain Management:    Induction: Intravenous  Airway Management Planned: Oral ETT  Additional Equipment:   Intra-op Plan:   Post-operative Plan: Extubation in OR  Informed Consent: I have reviewed the patients History and Physical, chart, labs and discussed the procedure including the risks, benefits and alternatives for the proposed anesthesia with the patient or authorized representative who has indicated his/her understanding and acceptance.   Dental advisory given  Plan Discussed with: CRNA and Anesthesiologist  Anesthesia Plan Comments:         Anesthesia Quick Evaluation  

## 2015-02-22 NOTE — Progress Notes (Signed)
Utilization review completed.  

## 2015-02-22 NOTE — Transfer of Care (Signed)
Immediate Anesthesia Transfer of Care Note  Patient: Walter [name]  Procedure(s) Performed: Procedure(s): RIGHT TOTAL HIP ARTHROPLASTY ANTERIOR APPROACH (Right)  Patient Location: PACU  Anesthesia Type:General  Level of Consciousness: awake  Airway & Oxygen Therapy: Patient Spontanous Breathing and Patient connected to nasal cannula oxygen  Post-op Assessment: Report given to RN and Post -op Vital signs reviewed and stable  Post vital signs: Reviewed and stable  Last Vitals:  Filed Vitals:   02/22/15 0648 02/22/15 1019  BP: 143/95 159/97  Pulse: 70 75  Temp:  37.1 C  Resp: 16 16    Complications: No apparent anesthesia complications

## 2015-02-23 ENCOUNTER — Encounter (HOSPITAL_COMMUNITY): Payer: Self-pay | Admitting: Orthopedic Surgery

## 2015-02-23 LAB — GLUCOSE, CAPILLARY: Glucose-Capillary: 137 mg/dL — ABNORMAL HIGH (ref 65–99)

## 2015-02-23 MED ORDER — TAMSULOSIN HCL 0.4 MG PO CAPS
0.4000 mg | ORAL_CAPSULE | Freq: Once | ORAL | Status: AC
  Administered 2015-02-23: 0.4 mg via ORAL
  Filled 2015-02-23: qty 1

## 2015-02-23 NOTE — Discharge Summary (Signed)
Physician Discharge Summary  Patient ID: Walter [name] MRN: [id number] DOB/AGE: 04/20/1945 69 y.o.  Admit date: 02/22/2015 Discharge date: 02/23/2015  Admission Diagnoses:  Primary osteoarthritis of right hip  Discharge Diagnoses:  Principal Problem:   Primary osteoarthritis of right hip Active Problems:   DJD (degenerative joint disease)   Status post total replacement of right hip   Past Medical History  Diagnosis Date  . Essential hypertension   . DJD (degenerative joint disease)     /osteoarthritis  . Frozen shoulder     right  . Chronic left hip pain   . Chronic low back pain     /sciatica- Dr Letta Median [facility])  . Asbestosis ([place])     mild  . GERD (gastroesophageal reflux disease)   . Hypogonadism male   . Coronary artery disease, non-occlusive June 2011    Cardiac cath in [facility] following abnormal Myoview  . Left bundle branch block (LBBB) on electrocardiogram     Diagnosed close to 20 years ago  . Obesity   . Factor V Leiden ([place])   . Testicular cancer ([place]) 1972    left  . Type 2 diabetes mellitus without complication ([place]) [id number]    Type 2.   . Non-ischemic cardiomyopathy [facility]) June 2011    EF 45-50%; suspect related to LBBB; repeat Echo March 2015: EF 50- 55%  . Dyslipidemia, goal LDL below 100     On lovastatin (myalgias with Zocor and Lipitor)  . Atrial fibrillation ([place])   . AICD (automatic cardioverter/defibrillator) present   . Myocardial infarction ([place])   . Sleep apnea     does not use cpap  . Kidney stone   . Restless leg syndrome   . Irritable bowel   . Complication of anesthesia 1996    previous surgery had a run of v-tach    Surgeries: Procedure(s): RIGHT TOTAL HIP ARTHROPLASTY ANTERIOR APPROACH on 02/22/2015   Consultants (if any):    Discharged Condition: Improved  Hospital Course: Walter [name] is an 69 y.o. male who was admitted 02/22/2015 with a diagnosis of Primary osteoarthritis of right hip and went to  the operating room on 02/22/2015 and underwent the above named procedures.    He was given perioperative antibiotics:  Anti-infectives    Start     Dose/Rate Route Frequency Ordered Stop   02/22/15 1300  ceFAZolin (ANCEF) IVPB 2 g/50 mL premix     2 g 100 mL/hr over 30 Minutes Intravenous Every 6 hours 02/22/15 1122 02/22/15 2136   02/22/15 0600  ceFAZolin (ANCEF) IVPB 2 g/50 mL premix     2 g 100 mL/hr over 30 Minutes Intravenous To [facility] 02/21/15 1533 02/22/15 0730    .  He was given sequential compression devices, early ambulation, and eliquis for DVT prophylaxis.  He benefited maximally from the hospital stay and there were no complications.    Recent vital signs:  Filed Vitals:   02/23/15 0051 02/23/15 0556  BP: 102/61 123/77  Pulse: 77 71  Temp: 99.9 F (37.7 C) 97.4 F (36.3 C)  Resp: 18 18    Recent laboratory studies:  Lab Results  Component Value Date   HGB 14.6 02/22/2015   HGB 18.6* 02/11/2015   HGB 16.1 07/23/2014   Lab Results  Component Value Date   WBC 13.3* 02/22/2015   PLT 162 02/22/2015   Lab Results  Component Value Date   INR 1.04 02/22/2015   Lab Results  Component Value Date  NA 138 02/14/2015   K 4.5 02/14/2015   CL 102 02/14/2015   CO2 27 02/14/2015   BUN 19 02/14/2015   CREATININE 1.31* 02/14/2015   GLUCOSE 111* 02/14/2015    Discharge Medications:     Medication List    STOP taking these medications        HYDROcodone-acetaminophen 10-325 MG tablet  Commonly known as:  NORCO     oxyCODONE-acetaminophen 5-325 MG tablet  Commonly known as:  PERCOCET/ROXICET      TAKE these medications        amiodarone 200 MG tablet  Commonly known as:  PACERONE  Take 1 tablet (200 mg total) by mouth daily.     apixaban 5 MG Tabs tablet  Commonly known as:  ELIQUIS  Take 1 tablet (5 mg total) by mouth 2 (two) times daily.     carvedilol 12.5 MG tablet  Commonly known as:  COREG  Take 1 tablet (12.5 mg total) by  mouth 2 (two) times daily.     cholecalciferol 1000 UNITS tablet  Commonly known as:  VITAMIN D  Take 1,000 Units by mouth every morning.     DEPO-TESTOSTERONE 200 MG/ML injection  Generic drug:  testosterone cypionate  Inject 200 mg into the muscle every Thursday.     diclofenac 1.3 % Ptch  Commonly known as:  FLECTOR  Place 1 patch onto the skin 2 (two) times daily as needed (pain).     docusate sodium 100 MG capsule  Commonly known as:  COLACE  Take 1 capsule (100 mg total) by mouth 2 (two) times daily.     esomeprazole 40 MG capsule  Commonly known as:  NEXIUM  Take 40 mg by mouth daily at 12 noon.     furosemide 20 MG tablet  Commonly known as:  LASIX  Take 1 tablet (20 mg total) by mouth daily.     gabapentin 300 MG capsule  Commonly known as:  NEURONTIN  Take 600 mg by mouth every evening.     lisinopril 10 MG tablet  Commonly known as:  PRINIVIL,ZESTRIL  Take 1 tablet (10 mg total) by mouth every evening.     lovastatin 40 MG 24 hr tablet  Commonly known as:  ALTOPREV  Take 40 mg by mouth at bedtime.     magnesium oxide 400 MG tablet  Commonly known as:  MAG-OX  Take 400 mg by mouth every morning.     metFORMIN 1000 MG tablet  Commonly known as:  GLUCOPHAGE  Take 1,000 mg by mouth 2 (two) times daily with a meal.     omega-3 acid ethyl esters 1 G capsule  Commonly known as:  LOVAZA  Take 1 g by mouth 2 (two) times daily.     ondansetron 4 MG tablet  Commonly known as:  ZOFRAN  Take 1 tablet (4 mg total) by mouth every 8 (eight) hours as needed for nausea or vomiting.     oxyCODONE 5 MG immediate release tablet  Commonly known as:  ROXICODONE  Take 1 tablet (5 mg total) by mouth every 4 (four) hours as needed for severe pain.     rOPINIRole 1 MG tablet  Commonly known as:  REQUIP  Take 1 mg by mouth at bedtime. (Pt also takes one 0.5 mg tablet at night as needed for RLS)     rOPINIRole 0.5 MG tablet  Commonly known as:  REQUIP  Take 0.5 mg by  mouth daily as needed (RLS).  sildenafil 100 MG tablet  Commonly known as:  VIAGRA  Take 100 mg by mouth daily as needed for erectile dysfunction.     spironolactone 25 MG tablet  Commonly known as:  ALDACTONE  Take 25 mg by mouth daily.     tiZANidine 4 MG capsule  Commonly known as:  ZANAFLEX  Take 4 mg by mouth 2 (two) times daily as needed for muscle spasms.        Diagnostic Studies: Dg Pelvis Portable  02/22/2015  CLINICAL DATA:  Post right hip replacement EXAM: PORTABLE PELVIS 1-2 VIEWS COMPARISON:  None. FINDINGS: Changes of right hip replacement. Normal AP alignment. No hardware or bony complicating feature. No acute bony abnormality. IMPRESSION: Right hip replacement.  No visible complicating feature Electronically Signed   By: Rolm Baptise M.D.   On: 02/22/2015 10:39    Disposition: 01-Home or Self Care      Discharge Instructions    Weight bearing as tolerated    Complete by:  As directed   Laterality:  right  Extremity:  Lower           Follow-up Information    Follow up with MURPHY, TIMOTHY D, MD In 10 days.   Specialty:  Orthopedic Surgery   Contact information:   [address] [place] [address] [phone]        Signed: Gae Dry 02/23/2015, 7:12 AM Cell [phone]

## 2015-02-23 NOTE — Evaluation (Signed)
Occupational Therapy Evaluation Patient Details Name: Walter [name] MRN: [id number] DOB: 03-25-1945 Today's Date: 02/23/2015    History of Present Illness Pt admitted for R direct anterior THA with PMHx of Afib, LBBB, NICM, and restless leg syndrome   Clinical Impression   Patient is s/p direct anterior hip surgery resulting in functional limitations due to the deficits listed below (see OT problem list). PTA independent and will have wife (A) upon d/c home. Patient will benefit from skilled OT acutely to increase independence and safety with ADLS to allow discharge home without follow up. Ot to follow acutely for bed mobility and basic transfer.      Follow Up Recommendations  No OT follow up    Equipment Recommendations  None recommended by OT    Recommendations for Other Services       Precautions / Restrictions Precautions Precautions: Fall Precaution Comments: no precautions anterior hip Restrictions Weight Bearing Restrictions: Yes RLE Weight Bearing: Weight bearing as tolerated      Mobility Bed Mobility Overal bed mobility: Needs Assistance Bed Mobility: Sit to Supine       Sit to supine: Mod assist   General bed mobility comments: requires (A) to lift R LE back into bed. Pt educated on L LE (A) and sheet as leg lifter  Transfers Overall transfer level: Needs assistance Equipment used: Rolling walker (2 wheeled) Transfers: Sit to/from Stand Sit to Stand: Min guard         General transfer comment: cues for technique and for breathing.    Balance     Sitting balance-Leahy Scale: Good     Standing balance support: During functional activity Standing balance-Leahy Scale: Fair Standing balance comment: Pt stood for several minutes at toilet and able to stand with unilateral support                            ADL Overall ADL's : Needs assistance/impaired                     Lower Body Dressing: Sit to/from stand;Moderate  assistance Lower Body Dressing Details (indicate cue type and reason): pt able to reach to ankle. Pt could thread clothing but shoes / socks would need (A). Educated on shoe options, shoe laces changed if choosing tennis shoes, ted hose, and dressing operative leg first Toilet Transfer: Min guard       Tub/ Banker: Walk-in shower;Minimal assistance;Rolling walker;3 in Risk manager Details (indicate cue type and reason): pt educated on sequenec and placement of 3n1   General ADL Comments: Pt at EOB with PT on arrival and Ot starting session. (see PT note for ambulation- OT observing) Pt educated with wife on - bed mobility, 3n1 for shower, LB dressing, seating in the home, car positioning for trip, and animal care in the home     Vision Vision Assessment?: No apparent visual deficits   Perception     Praxis      Pertinent Vitals/Pain Pain Assessment: 0-10 Pain Score: 5  Pain Location: surgerical site Pain Descriptors / Indicators: Discomfort Pain Intervention(s): Monitored during session;Premedicated before session;Repositioned;Ice applied     Hand Dominance Left   Extremity/Trunk Assessment Upper Extremity Assessment Upper Extremity Assessment: Overall WFL for tasks assessed   Lower Extremity Assessment Lower Extremity Assessment: Defer to PT evaluation   Cervical / Trunk Assessment Cervical / Trunk Assessment: Normal   Communication Communication Communication: No difficulties   Cognition Arousal/Alertness:  Awake/alert Behavior During Therapy: WFL for tasks assessed/performed Overall Cognitive Status: Within Functional Limits for tasks assessed                     General Comments       Exercises       Shoulder Instructions      Home Living Family/patient expects to be discharged to:: Private residence Living Arrangements: Spouse/significant other Available Help at Discharge: Family Type of Home: House              Bathroom Shower/Tub: Walk-in shower   Bathroom Toilet: Standard     Home Equipment: Environmental consultant - 2 wheels;Grab bars - tub/shower;Bedside commode;Hand held shower head;Adaptive equipment Adaptive Equipment: Reacher Additional Comments: wife with be available 24/7 and they will take a cruise mid January for 50 anniversry      Prior Functioning/Environment Level of Independence: Independent             OT Diagnosis: Generalized weakness;Acute pain   OT Problem List: Decreased strength;Decreased activity tolerance;Impaired balance (sitting and/or standing);Decreased knowledge of use of DME or AE;Decreased safety awareness;Decreased knowledge of precautions;Pain   OT Treatment/Interventions: Self-care/ADL training;DME and/or AE instruction;Therapeutic activities;Balance training;Patient/family education    OT Goals(Current goals can be found in the care plan section) Acute Rehab OT Goals Patient Stated Goal: to go on cruise in January OT Goal Formulation: With patient/family Time For Goal Achievement: 03/09/15 Potential to Achieve Goals: Good  OT Frequency: Min 2X/week   Barriers to D/C:            Co-evaluation              End of Session Equipment Utilized During Treatment: Rolling walker Nurse Communication: Mobility status;Precautions  Activity Tolerance: Patient tolerated treatment well Patient left: in bed;with call bell/phone within reach;with family/visitor present   Time: [id number] OT Time Calculation (min): 31 min Charges:  OT General Charges $OT Visit: 1 Procedure OT Evaluation $Initial OT Evaluation Tier I: 1 Procedure OT Treatments $Self Care/Home Management : 8-22 mins G-Codes:    Parke Poisson B 2015/03/19, 10:51 AM   Jeri Modena   OTR/L [phone] Office: [phone] .

## 2015-02-23 NOTE — Progress Notes (Signed)
Pt ready for d/c per MD. Cleared by PT/OT, has equipment needed. Discharge instructions and prescriptions reviewed with pt and wife at bedside, all questions answered. Belongings gathered and removed by wife. Pt wheeled out by NT. Raquel James  02/23/2015 3:15 PM

## 2015-02-23 NOTE — Care Management Note (Signed)
Case Management Note  Patient Details  Name: Walter [name] MRN: [id number] Date of Birth: 07-Apr-1945  Subjective/Objective:            S/p right total hip arthroplasty        Action/Plan: Set up with [facility] for HHPT by MD office. Spoke with patient and his wife, no change in discharge plan. Patient stated that he has a rolling walker and 3N1 at home. Wife states that she will be able to assist after discharge. No other discharge needs identified.   Expected Discharge Date:                  Expected Discharge Plan:  [place]  In-House Referral:  NA  Discharge planning Services  CM Consult  Post Acute Care Choice:  Home Health Choice offered to:  Patient  DME Arranged:    DME Agency:     HH Arranged:  PT HH Agency:  [facility]  Status of Service:  Completed, signed off  Medicare Important Message Given:    Date Medicare IM Given:    Medicare IM give by:    Date Additional Medicare IM Given:    Additional Medicare Important Message give by:     If discussed at [place] of Stay Meetings, dates discussed:    Additional Comments:  Nila Nephew, RN 02/23/2015, 8:53 AM

## 2015-02-23 NOTE — Progress Notes (Signed)
Physical Therapy Treatment Patient Details Name: Walter [name] MRN: [id number] DOB: 10-29-1945 Today's Date: 02/23/2015    History of Present Illness Pt admitted for R direct anterior THA with PMHx of Afib, LBBB, NICM, and restless leg syndrome and hematoma around incision present after surgery.    PT Comments    Pt was in chair upon entering room for treatment with wife and daughter present. Pt was visibly shaky when first standing up from recliner, however, from that point forward pt looked steady and stable on his feet.  HEP exercises were reviewed with no complaints of increased pain; SPTA educated wife to stabilize walker when pt performs standing exercises. Pt and family had no further questions or concerns for SPTA at the end of treatment.   Follow Up Recommendations  Home health PT     Equipment Recommendations  None recommended by PT       Precautions / Restrictions Precautions Precautions: Fall Precaution Comments: no precautions anterior hip Restrictions Weight Bearing Restrictions: Yes RLE Weight Bearing: Weight bearing as tolerated    Mobility                    Transfers Overall transfer level: Needs assistance Equipment used: Rolling walker (2 wheeled) Transfers: Sit to/from Stand Sit to Stand: Min guard         General transfer comment: Min guard required for safety, no physical assistance needed. Verbal cuing to push off on stable surface and not pull up on RW.  Ambulation/Gait Ambulation/Gait assistance: Min guard;Supervision Ambulation Distance (Feet): 100 Feet Assistive device: Rolling walker (2 wheeled) Gait Pattern/deviations: Step-through pattern;Decreased stride length;Antalgic;Trunk flexed Gait velocity: decreased Gait velocity interpretation: <1.8 ft/sec, indicative of risk for recurrent falls General Gait Details: Pt and wife educated on proper sequencing with RW for gait training. Pt still had slight antalgic gait but looked stable  over all. Pt required min guard then progressed to supervision for safety.              Cognition Arousal/Alertness: Awake/alert Behavior During Therapy: WFL for tasks assessed/performed Overall Cognitive Status: Within Functional Limits for tasks assessed                      Exercises Total Joint Exercises Long Arc Quad: AROM;AAROM;Right;10 reps;Seated Knee Flexion: AROM;Right;10 reps;Standing Marching in Standing: AROM;10 reps;Right;Standing (used RW for support) Standing Hip Extension: AROM;Right;10 reps;Standing        Pertinent Vitals/Pain Pain Assessment: 0-10 Pain Score: 7  Pain Location: surgical incision Pain Descriptors / Indicators: Grimacing;Guarding;Sore Pain Intervention(s): Monitored during session           PT Goals (current goals can now be found in the care plan section) Progress towards PT goals: Progressing toward goals    Frequency  7X/week    PT Plan Current plan remains appropriate       End of Session Equipment Utilized During Treatment: Gait belt Activity Tolerance: Patient tolerated treatment well Patient left: in chair;with call bell/phone within reach;with family/visitor present     Time: 1411-1446 PT Time Calculation (min) (ACUTE ONLY): 35 min  Charges:   1 Gait [place], [place] OFFICE  02/23/2015, 3:00 PM

## 2015-02-23 NOTE — Progress Notes (Signed)
Pt refuses ace wrap on right let stating it's too painful. Education provided on benefits of ace wrap to reduce the hematoma on that leg and aid in healing. Pt understood and stated that right now it's too painful for him.

## 2015-02-23 NOTE — Progress Notes (Signed)
     Subjective:  POD#1 R total hip arthroplasty. Patient reports pain as moderate.  Resting comfortably in bed this morning.  Patient had drainage from incision site last night that was reinforced by nursing.  Patient refused to have the ace wrap reapplied to help with hematoma control.  Eliquis has been restarted per cardiology recommendation.   Objective:   VITALS:   Filed Vitals:   02/22/15 1932 02/22/15 2000 02/23/15 0051 02/23/15 0556  BP: 99/63  102/61 123/77  Pulse: 77  77 71  Temp: 100.3 F (37.9 C) 99.7 F (37.6 C) 99.9 F (37.7 C) 97.4 F (36.3 C)  TempSrc: Oral Oral Oral Oral  Resp: 18  18 18   SpO2: [id number]  [id number] 97%    Neurologically intact ABD soft Neurovascular intact Sensation intact distally Intact pulses distally Dorsiflexion/Plantar flexion intact Incision: moderate drainage Hematoma at the distal aspect of the incision.  Much improved from yesterday.   Lab Results  Component Value Date   WBC 13.3* 02/22/2015   HGB 14.6 02/22/2015   HCT 46.1 02/22/2015   MCV 99.1 02/22/2015   PLT 162 02/22/2015   BMET    Component Value Date/Time   NA 138 02/14/2015 1150   K 4.5 02/14/2015 1150   CL 102 02/14/2015 1150   CO2 27 02/14/2015 1150   GLUCOSE 111* 02/14/2015 1150   BUN 19 02/14/2015 1150   CREATININE 1.31* 02/14/2015 1150   CALCIUM 9.4 02/14/2015 1150   GFRNONAA 53* 02/14/2015 1150   GFRAA >60 02/14/2015 1150     Assessment/Plan: 1 Day Post-Op   Principal Problem:   Primary osteoarthritis of right hip Active Problems:   DJD (degenerative joint disease)   Status post total replacement of right hip   Up with therapy WBAT in the RLE Eliquis for DVT prophylaxis Will see how the patient mobilizes with PT today.  May be appropriate for discharge later today.    Cathlin Buchan Lelan Pons 02/23/2015, 6:34 AM Cell [phone]

## 2015-02-23 NOTE — Progress Notes (Signed)
Physical Therapy Treatment Patient Details Name: Walter [name] MRN: [id number] DOB: 01-Dec-1945 Today's Date: 02/23/2015    History of Present Illness Pt admitted for R direct anterior THA with PMHx of Afib, LBBB, NICM, and restless leg syndrome    PT Comments    Pt able to ambulate into hallway today with decreased speed, but no c/o lightheadedness or dizziness.  Pt had to go urgently to restroom upon arrival and OT present at end of session, so deferred therex for 2nd PT session of the day.  Follow Up Recommendations  Home health PT     Equipment Recommendations  None recommended by PT    Recommendations for Other Services       Precautions / Restrictions Precautions Precautions: Fall Precaution Comments: no precautions anterior hip Restrictions Weight Bearing Restrictions: Yes RLE Weight Bearing: Weight bearing as tolerated    Mobility  Bed Mobility               General bed mobility comments: Pt up in recliner upon arrival  Transfers Overall transfer level: Needs assistance Equipment used: Rolling walker (2 wheeled) Transfers: Sit to/from Stand Sit to Stand: Min guard         General transfer comment: cues for technique and for breathing.  Ambulation/Gait Ambulation/Gait assistance: Min guard Ambulation Distance (Feet): 70 Feet Assistive device: Rolling walker (2 wheeled) Gait Pattern/deviations: Decreased step length - right;Decreased step length - left Gait velocity: decreased Gait velocity interpretation: Below normal speed for age/gender General Gait Details: Cues for proper sequence with slow speed. Pt with no complaints of dizziness or lightheadedness during gait.   Stairs            Wheelchair Mobility    Modified Rankin (Stroke Patients Only)       Balance     Sitting balance-Leahy Scale: Good     Standing balance support: During functional activity Standing balance-Leahy Scale: Fair Standing balance comment: Pt stood for  several minutes at toilet and able to stand with unilateral support                    Cognition Arousal/Alertness: Awake/alert Behavior During Therapy: WFL for tasks assessed/performed Overall Cognitive Status: Within Functional Limits for tasks assessed                      Exercises      General Comments General comments (skin integrity, edema, etc.): Pt feeling like he needed to urinate.  Was finally able to, but not as much as he wanted to.      Pertinent Vitals/Pain Pain Assessment: 0-10 Pain Score: 5  Pain Location: R hip Pain Descriptors / Indicators: Operative site guarding Pain Intervention(s): Monitored during session;Repositioned    Home Living                      Prior Function            PT Goals (current goals can now be found in the care plan section) Acute Rehab PT Goals Patient Stated Goal: Return to home without pain PT Goal Formulation: With patient/family Time For Goal Achievement: 03/01/15 Potential to Achieve Goals: Good Progress towards PT goals: Progressing toward goals    Frequency  7X/week    PT Plan Current plan remains appropriate    Co-evaluation             End of Session Equipment Utilized During Treatment: Gait belt Activity Tolerance: Patient tolerated treatment  well Patient left: Other (comment);with family/visitor present (sitting EOB with OT)     Time: [id number] PT Time Calculation (min) (ACUTE ONLY): 21 min  Charges:  $Gait Training: 8-22 mins                    G Codes:      Alexey Rhoads LUBECK 02/23/2015, 10:40 AM

## 2015-02-24 DIAGNOSIS — I10 Essential (primary) hypertension: Secondary | ICD-10-CM | POA: Diagnosis not present

## 2015-02-24 DIAGNOSIS — I2510 Atherosclerotic heart disease of native coronary artery without angina pectoris: Secondary | ICD-10-CM | POA: Diagnosis not present

## 2015-02-24 DIAGNOSIS — I480 Paroxysmal atrial fibrillation: Secondary | ICD-10-CM | POA: Diagnosis not present

## 2015-02-24 DIAGNOSIS — E119 Type 2 diabetes mellitus without complications: Secondary | ICD-10-CM | POA: Diagnosis not present

## 2015-02-24 DIAGNOSIS — I429 Cardiomyopathy, unspecified: Secondary | ICD-10-CM | POA: Diagnosis not present

## 2015-02-24 DIAGNOSIS — Z471 Aftercare following joint replacement surgery: Secondary | ICD-10-CM | POA: Diagnosis not present

## 2015-02-24 MED FILL — Lovastatin Tab ER 24HR 40 MG: ORAL | Qty: 1 | Status: AC

## 2015-02-25 DIAGNOSIS — I2510 Atherosclerotic heart disease of native coronary artery without angina pectoris: Secondary | ICD-10-CM | POA: Diagnosis not present

## 2015-02-25 DIAGNOSIS — I429 Cardiomyopathy, unspecified: Secondary | ICD-10-CM | POA: Diagnosis not present

## 2015-02-25 DIAGNOSIS — I10 Essential (primary) hypertension: Secondary | ICD-10-CM | POA: Diagnosis not present

## 2015-02-25 DIAGNOSIS — I480 Paroxysmal atrial fibrillation: Secondary | ICD-10-CM | POA: Diagnosis not present

## 2015-02-25 DIAGNOSIS — E119 Type 2 diabetes mellitus without complications: Secondary | ICD-10-CM | POA: Diagnosis not present

## 2015-02-25 DIAGNOSIS — Z471 Aftercare following joint replacement surgery: Secondary | ICD-10-CM | POA: Diagnosis not present

## 2015-02-28 DIAGNOSIS — I10 Essential (primary) hypertension: Secondary | ICD-10-CM | POA: Diagnosis not present

## 2015-02-28 DIAGNOSIS — I429 Cardiomyopathy, unspecified: Secondary | ICD-10-CM | POA: Diagnosis not present

## 2015-02-28 DIAGNOSIS — I2510 Atherosclerotic heart disease of native coronary artery without angina pectoris: Secondary | ICD-10-CM | POA: Diagnosis not present

## 2015-02-28 DIAGNOSIS — I480 Paroxysmal atrial fibrillation: Secondary | ICD-10-CM | POA: Diagnosis not present

## 2015-02-28 DIAGNOSIS — Z471 Aftercare following joint replacement surgery: Secondary | ICD-10-CM | POA: Diagnosis not present

## 2015-02-28 DIAGNOSIS — E119 Type 2 diabetes mellitus without complications: Secondary | ICD-10-CM | POA: Diagnosis not present

## 2015-03-01 ENCOUNTER — Telehealth: Payer: Self-pay | Admitting: Cardiology

## 2015-03-01 NOTE — Telephone Encounter (Signed)
LMOVM reminding pt to send remote transmission.   

## 2015-03-02 DIAGNOSIS — I429 Cardiomyopathy, unspecified: Secondary | ICD-10-CM | POA: Diagnosis not present

## 2015-03-02 DIAGNOSIS — I480 Paroxysmal atrial fibrillation: Secondary | ICD-10-CM | POA: Diagnosis not present

## 2015-03-02 DIAGNOSIS — I10 Essential (primary) hypertension: Secondary | ICD-10-CM | POA: Diagnosis not present

## 2015-03-02 DIAGNOSIS — I2510 Atherosclerotic heart disease of native coronary artery without angina pectoris: Secondary | ICD-10-CM | POA: Diagnosis not present

## 2015-03-02 DIAGNOSIS — Z471 Aftercare following joint replacement surgery: Secondary | ICD-10-CM | POA: Diagnosis not present

## 2015-03-02 DIAGNOSIS — E119 Type 2 diabetes mellitus without complications: Secondary | ICD-10-CM | POA: Diagnosis not present

## 2015-03-04 DIAGNOSIS — I480 Paroxysmal atrial fibrillation: Secondary | ICD-10-CM | POA: Diagnosis not present

## 2015-03-04 DIAGNOSIS — I429 Cardiomyopathy, unspecified: Secondary | ICD-10-CM | POA: Diagnosis not present

## 2015-03-04 DIAGNOSIS — Z471 Aftercare following joint replacement surgery: Secondary | ICD-10-CM | POA: Diagnosis not present

## 2015-03-04 DIAGNOSIS — E119 Type 2 diabetes mellitus without complications: Secondary | ICD-10-CM | POA: Diagnosis not present

## 2015-03-04 DIAGNOSIS — I2510 Atherosclerotic heart disease of native coronary artery without angina pectoris: Secondary | ICD-10-CM | POA: Diagnosis not present

## 2015-03-04 DIAGNOSIS — I10 Essential (primary) hypertension: Secondary | ICD-10-CM | POA: Diagnosis not present

## 2015-03-07 DIAGNOSIS — M1611 Unilateral primary osteoarthritis, right hip: Secondary | ICD-10-CM | POA: Diagnosis not present

## 2015-03-07 DIAGNOSIS — Z96641 Presence of right artificial hip joint: Secondary | ICD-10-CM | POA: Diagnosis not present

## 2015-03-07 DIAGNOSIS — M25551 Pain in right hip: Secondary | ICD-10-CM | POA: Diagnosis not present

## 2015-03-07 DIAGNOSIS — R262 Difficulty in walking, not elsewhere classified: Secondary | ICD-10-CM | POA: Diagnosis not present

## 2015-03-08 NOTE — Progress Notes (Signed)
No ICM transmission received following Total Hip Surgery and after reminder call.    Next remote ICM transmission scheduled for 03/23/2015 which will be first encounter.

## 2015-03-09 ENCOUNTER — Other Ambulatory Visit (HOSPITAL_COMMUNITY): Payer: Self-pay | Admitting: Orthopedic Surgery

## 2015-03-09 ENCOUNTER — Ambulatory Visit (HOSPITAL_COMMUNITY)
Admission: RE | Admit: 2015-03-09 | Discharge: 2015-03-09 | Disposition: A | Discharge status: Home / Self Care | Payer: Medicare Other | Source: Ambulatory Visit | Attending: Cardiovascular Disease | Admitting: Cardiovascular Disease

## 2015-03-09 DIAGNOSIS — R52 Pain, unspecified: Secondary | ICD-10-CM

## 2015-03-09 DIAGNOSIS — I10 Essential (primary) hypertension: Secondary | ICD-10-CM | POA: Diagnosis not present

## 2015-03-09 DIAGNOSIS — Z96641 Presence of right artificial hip joint: Secondary | ICD-10-CM | POA: Diagnosis not present

## 2015-03-09 DIAGNOSIS — Z87891 Personal history of nicotine dependence: Secondary | ICD-10-CM | POA: Insufficient documentation

## 2015-03-09 DIAGNOSIS — E119 Type 2 diabetes mellitus without complications: Secondary | ICD-10-CM | POA: Insufficient documentation

## 2015-03-09 DIAGNOSIS — M79604 Pain in right leg: Secondary | ICD-10-CM | POA: Insufficient documentation

## 2015-03-09 DIAGNOSIS — M7989 Other specified soft tissue disorders: Secondary | ICD-10-CM | POA: Insufficient documentation

## 2015-03-10 ENCOUNTER — Encounter: Payer: Self-pay | Admitting: Internal Medicine

## 2015-03-15 DIAGNOSIS — M1611 Unilateral primary osteoarthritis, right hip: Secondary | ICD-10-CM | POA: Diagnosis not present

## 2015-03-15 DIAGNOSIS — M25551 Pain in right hip: Secondary | ICD-10-CM | POA: Diagnosis not present

## 2015-03-15 DIAGNOSIS — R262 Difficulty in walking, not elsewhere classified: Secondary | ICD-10-CM | POA: Diagnosis not present

## 2015-03-15 DIAGNOSIS — Z96641 Presence of right artificial hip joint: Secondary | ICD-10-CM | POA: Diagnosis not present

## 2015-03-16 DIAGNOSIS — Z96641 Presence of right artificial hip joint: Secondary | ICD-10-CM | POA: Diagnosis not present

## 2015-03-17 DIAGNOSIS — Z96641 Presence of right artificial hip joint: Secondary | ICD-10-CM | POA: Diagnosis not present

## 2015-03-17 DIAGNOSIS — M1611 Unilateral primary osteoarthritis, right hip: Secondary | ICD-10-CM | POA: Diagnosis not present

## 2015-03-17 DIAGNOSIS — M25551 Pain in right hip: Secondary | ICD-10-CM | POA: Diagnosis not present

## 2015-03-17 DIAGNOSIS — R262 Difficulty in walking, not elsewhere classified: Secondary | ICD-10-CM | POA: Diagnosis not present

## 2015-03-22 ENCOUNTER — Telehealth: Payer: Self-pay

## 2015-03-22 NOTE — Telephone Encounter (Signed)
Returned call to patient.  He asked if the scheduled transmission for tomorrow will be automatic.   Explained it should be automatically sent between 12 midnight and 5:00 AM as long he is within 4-6 feet of this monitor.  He stated he sleeps beside his monitor.   He is recovering from Total Hip Replacement and it is going well.    Received voice mail message from patient requesting call back regarding scheduled ICM transmission.

## 2015-03-23 ENCOUNTER — Telehealth: Payer: Self-pay | Admitting: Cardiology

## 2015-03-23 ENCOUNTER — Ambulatory Visit (INDEPENDENT_AMBULATORY_CARE_PROVIDER_SITE_OTHER): Payer: Medicare Other

## 2015-03-23 DIAGNOSIS — I5022 Chronic systolic (congestive) heart failure: Secondary | ICD-10-CM

## 2015-03-23 DIAGNOSIS — R262 Difficulty in walking, not elsewhere classified: Secondary | ICD-10-CM | POA: Diagnosis not present

## 2015-03-23 DIAGNOSIS — Z95810 Presence of automatic (implantable) cardiac defibrillator: Secondary | ICD-10-CM | POA: Diagnosis not present

## 2015-03-23 DIAGNOSIS — M1611 Unilateral primary osteoarthritis, right hip: Secondary | ICD-10-CM | POA: Diagnosis not present

## 2015-03-23 DIAGNOSIS — M25551 Pain in right hip: Secondary | ICD-10-CM | POA: Diagnosis not present

## 2015-03-23 DIAGNOSIS — Z96641 Presence of right artificial hip joint: Secondary | ICD-10-CM | POA: Diagnosis not present

## 2015-03-23 NOTE — Telephone Encounter (Signed)
LMOVM reminding pt to send remote transmission.   

## 2015-03-24 NOTE — Progress Notes (Signed)
He needs to go back on Lasix 20 mg daily as prior to hip surgery.

## 2015-03-24 NOTE — Progress Notes (Signed)
EPIC Encounter for ICM Monitoring  Patient Name: Walter [name] is a 70 y.o. male Date: 03/24/2015 Primary Care Physican: London Pepper, MD Primary Cardiologist: Aundra Dubin Electrophysiologist: Allred Dry Weight: 247.2 lb       In the past month, have you:  1. Gained more than 2 pounds in a day or more than 5 pounds in a week? He has gained 3-4 pounds in the last couple of weeks.   2. Had changes in your medications (with verification of current medications)? He said he was not instructed to resume Furosemide at time of hospital discharge  3. Had more shortness of breath than is usual for you? no  4. Limited your activity because of shortness of breath? no  5. Not been able to sleep because of shortness of breath? no  6. Had increased swelling in your feet or ankles? no  7. Had symptoms of dehydration (dizziness, dry mouth, increased thirst, decreased urine output) no  8. Had changes in sodium restriction? Does not strictly follow low salt diet  9. Been compliant with medication? No, due to he was not aware he should resume Furosemide.    ICM trend: 03/23/2015 - 3 month view  ICM Trend:  03/23/2015 1 year view   Follow-up plan: ICM clinic phone appointment on 04/26/2015.  1st ICM encounter.   Corvue impedance below baseline 03/14/2015 to 03/22/2014 suggesting fluid retention.  He reported weight gain in the last 2 weeks.  His dry weight is ~242 lbs and he weighed 247.2 on 03/23/2015.  He denied other HF symptoms.   He reported he was not instructed at time of discharge to resume his Furosemide 20 mg daily after he was discharged from the hospital on 02/23/2015 following total hip replacement.  He does not follow a strict low sodium diet, just does not salt his foods.  Education given to follow low sodium diet of 2000 mg daily and 64 oz fluid intake to decrease risk of fluid retention.  He verbalized understanding and that has been recommended by his physicians.    Advised I would send for Dr  Aundra Dubin and [name] for review and to confirm he should restart his Furosemide 20 mg daily as prescribed.  Will call patient back with recommendations.     Copy of note sent to patient's primary care physician, primary cardiologist, and device following physician.  Rosalene Billings, RN, CCM 03/24/2015 11:28 AM

## 2015-03-25 NOTE — Progress Notes (Signed)
Patient notified to take Lasix 20 mg daily as prescribed.  He verbalized understanding.

## 2015-03-26 NOTE — Progress Notes (Signed)
Yes, he should restart Lasix 20 mg daily.  He was on this before surgery.  Megan/Heather, could one of you call him and start this? Thanks.

## 2015-03-30 DIAGNOSIS — G894 Chronic pain syndrome: Secondary | ICD-10-CM | POA: Diagnosis not present

## 2015-03-30 DIAGNOSIS — M1288 Other specific arthropathies, not elsewhere classified, other specified site: Secondary | ICD-10-CM | POA: Diagnosis not present

## 2015-03-30 DIAGNOSIS — M47817 Spondylosis without myelopathy or radiculopathy, lumbosacral region: Secondary | ICD-10-CM | POA: Diagnosis not present

## 2015-03-30 DIAGNOSIS — E669 Obesity, unspecified: Secondary | ICD-10-CM | POA: Diagnosis not present

## 2015-03-30 DIAGNOSIS — M5137 Other intervertebral disc degeneration, lumbosacral region: Secondary | ICD-10-CM | POA: Diagnosis not present

## 2015-03-30 DIAGNOSIS — Z79899 Other long term (current) drug therapy: Secondary | ICD-10-CM | POA: Diagnosis not present

## 2015-04-13 DIAGNOSIS — Z96641 Presence of right artificial hip joint: Secondary | ICD-10-CM | POA: Diagnosis not present

## 2015-04-26 ENCOUNTER — Telehealth: Payer: Self-pay | Admitting: Cardiology

## 2015-04-26 ENCOUNTER — Telehealth: Payer: Self-pay

## 2015-04-26 NOTE — Telephone Encounter (Signed)
Spoke with pt and reminded pt of remote transmission that is due today. Pt verbalized understanding.   

## 2015-04-26 NOTE — Telephone Encounter (Signed)
Call to patient.  He has already tried to reset the monitor and it is not working.  Provided tech support number.  He stated he has appointment with CHF clinic 04/28/2015 and advised will reschedule appointment since he is unable to send transmission today.  Next remote transmission on 05/18/2015 and patient notified by my chart as requested.   Received call from patient stating he was unable to send a manual ICM transmission and requested a call back.

## 2015-04-28 ENCOUNTER — Other Ambulatory Visit (HOSPITAL_COMMUNITY): Payer: Self-pay | Admitting: *Deleted

## 2015-04-28 ENCOUNTER — Encounter (HOSPITAL_COMMUNITY): Payer: Self-pay

## 2015-04-28 ENCOUNTER — Ambulatory Visit (HOSPITAL_COMMUNITY)
Admission: RE | Admit: 2015-04-28 | Discharge: 2015-04-28 | Disposition: A | Discharge status: Home / Self Care | Payer: Medicare Other | Source: Ambulatory Visit | Attending: Cardiology | Admitting: Cardiology

## 2015-04-28 VITALS — BP 118/62 | HR 91 | Wt 251.0 lb

## 2015-04-28 DIAGNOSIS — I428 Other cardiomyopathies: Secondary | ICD-10-CM | POA: Diagnosis not present

## 2015-04-28 DIAGNOSIS — Z8547 Personal history of malignant neoplasm of testis: Secondary | ICD-10-CM | POA: Diagnosis not present

## 2015-04-28 DIAGNOSIS — Z7902 Long term (current) use of antithrombotics/antiplatelets: Secondary | ICD-10-CM | POA: Insufficient documentation

## 2015-04-28 DIAGNOSIS — E785 Hyperlipidemia, unspecified: Secondary | ICD-10-CM | POA: Diagnosis not present

## 2015-04-28 DIAGNOSIS — E119 Type 2 diabetes mellitus without complications: Secondary | ICD-10-CM | POA: Insufficient documentation

## 2015-04-28 DIAGNOSIS — I2510 Atherosclerotic heart disease of native coronary artery without angina pectoris: Secondary | ICD-10-CM | POA: Insufficient documentation

## 2015-04-28 DIAGNOSIS — K219 Gastro-esophageal reflux disease without esophagitis: Secondary | ICD-10-CM | POA: Diagnosis not present

## 2015-04-28 DIAGNOSIS — I10 Essential (primary) hypertension: Secondary | ICD-10-CM | POA: Diagnosis not present

## 2015-04-28 DIAGNOSIS — Z87891 Personal history of nicotine dependence: Secondary | ICD-10-CM | POA: Diagnosis not present

## 2015-04-28 DIAGNOSIS — Z79899 Other long term (current) drug therapy: Secondary | ICD-10-CM | POA: Insufficient documentation

## 2015-04-28 DIAGNOSIS — I480 Paroxysmal atrial fibrillation: Secondary | ICD-10-CM | POA: Insufficient documentation

## 2015-04-28 DIAGNOSIS — Z7984 Long term (current) use of oral hypoglycemic drugs: Secondary | ICD-10-CM | POA: Diagnosis not present

## 2015-04-28 DIAGNOSIS — Z96641 Presence of right artificial hip joint: Secondary | ICD-10-CM | POA: Insufficient documentation

## 2015-04-28 DIAGNOSIS — G2581 Restless legs syndrome: Secondary | ICD-10-CM | POA: Diagnosis not present

## 2015-04-28 DIAGNOSIS — Z95810 Presence of automatic (implantable) cardiac defibrillator: Secondary | ICD-10-CM | POA: Diagnosis not present

## 2015-04-28 DIAGNOSIS — D751 Secondary polycythemia: Secondary | ICD-10-CM | POA: Insufficient documentation

## 2015-04-28 DIAGNOSIS — I519 Heart disease, unspecified: Secondary | ICD-10-CM | POA: Diagnosis not present

## 2015-04-28 DIAGNOSIS — R002 Palpitations: Secondary | ICD-10-CM

## 2015-04-28 LAB — COMPREHENSIVE METABOLIC PANEL
ALT: 16 U/L — ABNORMAL LOW (ref 17–63)
ANION GAP: 9 (ref 5–15)
AST: 20 U/L (ref 15–41)
Albumin: 4 g/dL (ref 3.5–5.0)
Alkaline Phosphatase: 81 U/L (ref 38–126)
BUN: 21 mg/dL — ABNORMAL HIGH (ref 6–20)
CHLORIDE: 105 mmol/L (ref 101–111)
CO2: 24 mmol/L (ref 22–32)
Calcium: 8.9 mg/dL (ref 8.9–10.3)
Creatinine, Ser: 1.18 mg/dL (ref 0.61–1.24)
Glucose, Bld: 115 mg/dL — ABNORMAL HIGH (ref 65–99)
POTASSIUM: 4.4 mmol/L (ref 3.5–5.1)
Sodium: 138 mmol/L (ref 135–145)
Total Bilirubin: 0.9 mg/dL (ref 0.3–1.2)
Total Protein: 6.2 g/dL — ABNORMAL LOW (ref 6.5–8.1)

## 2015-04-28 LAB — CBC
HEMATOCRIT: 50.8 % (ref 39.0–52.0)
HEMOGLOBIN: 16 g/dL (ref 13.0–17.0)
MCH: 30 pg (ref 26.0–34.0)
MCHC: 31.5 g/dL (ref 30.0–36.0)
MCV: 95.3 fL (ref 78.0–100.0)
Platelets: 182 10*3/uL (ref 150–400)
RBC: 5.33 MIL/uL (ref 4.22–5.81)
RDW: 14 % (ref 11.5–15.5)
WBC: 6 10*3/uL (ref 4.0–10.5)

## 2015-04-28 LAB — TSH: TSH: 5.327 u[IU]/mL — ABNORMAL HIGH (ref 0.350–4.500)

## 2015-04-28 LAB — BRAIN NATRIURETIC PEPTIDE: B Natriuretic Peptide: 20.3 pg/mL (ref 0.0–100.0)

## 2015-04-28 NOTE — Progress Notes (Signed)
Patient ID: Walter [name], male   DOB: 1946-03-18, 70 y.o.   MRN: [id number] PCP: Dr. Orland Mustard  70 yo with history of paroxysmal atrial fibrillation, chronic LBBB and cardiomyopathy presents for cardiology evaluation.  He was found to have LBBB 15-20 years ago.  In 2011, he had a stress test in [facility] that was abnormal so he was taken for [facility] in 6/11 that showed nonobstructive disease.  He had an echo in 5/11 that showed EF 45-50%, diffuse hypokinesis suggesting a mild nonischemic cardiomyopathy.   He has admitted in 7/15 with atrial fibrillation with RVR.  Rate was very difficult to control.  TEE failed due to inability to pass probe x 2.  Eventually, ICE was done to rule out LAA thrombus and he was cardioverted to NSR.  He is on amiodarone now and in NSR.  Echo in 7/15 showed EF 20% with diffuse hypokinesis in setting of atrial fibrillation/RVR.  LHC was also done in 7/15 with minimal coronary disease.  Repeat echo in 10/15 showed EF 20-25%.  In 11/15, he had St Jude CRT-D device placed.  Repeat echo in 4/16 showed EF improved to 50% with mild LVH.   He had right THR in [id number], uncomplicated.  He did develop dyspnea and edema off Lasix after the procedure so Lasix was restarted.   He is now on Eliquis with no BRBPR or melena.  Weight is down 4 lbs.  No exertional dyspnea, starting to walk more.  No chest pain.  No orthopnea or PND.  No palpitations.  He is in NSR today.    Corevue checked today, impedance stable and BiV pacing >99% of the time.  No atrial fibrillation.    Labs (11/14): K 3.9, creatinine 1.14, LDL 104, LFTs normal Labs (2/15): HCT 56.3 Labs (9/15): K 4.1, creatinine 1.1, digoxin 0.9, TSH normal, LFTs normal Labs (10/15): TGs 262, LDL 82, BNP 68 Labs (11/15): K 4.1, creatinine 1.12 Labs (12/15): K 4.8, creatinine 1.02 Labs (3/16): K 4.6, creatinine 1.06, digoxin 0.8, LFTs normal, TSH normal Labs (11/16): K 4.2, creatinine 1.17 => 1.3, HCT 56.2, TSH normal, LDL 75, HDL 34, LFTs  normal  PMH: 1. Type II diabetes 2. HTN 3. Adrenal nodule 4. Restless leg syndrome 5. OA with right hip pain, right frozen shoulder, low back pain. S/p R THR in 12/16.  6. Testicular cancer: 1972 7. Chronic LBBB: Had Cardiolite in 2011 that was abnormal so had LHC in 6/11 in [place], [place].  This showed 30% LCx stenosis and 40% ostial RCA stenosis.  LHC in 7/15 in [place] showed minimal coronary disease.  8. 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  Echo (5/11) with EF 45-50%, moderate LVH.  Echo (7/15) with EF 20%, diffuse hypokinesis, mild to moderately decreased RV systolic function (in setting of afib with RVR). LHC (7/15) with minimal coronary disease.  Echo (10/15) with EF 20-25%, mild LVH, moderate LAE. St Jude CRT-D device placed in 11/15. Echo (4/16) with EF 50%, mild LVH.  9. Factor V Leiden + 10. Asbestosis: Mild.  Exposure while in the [employer].  11. GERD 12. PVCs 13. Polycythemia: Uncertain etiology 14. Hyperlipidemia: Myalgias with Zocor and Lipitor.  15. Atrial fibrillation: Paroxysmal.  Unable to pass TEE probe.   SH: Retired from [employer], lives with wife in [place].  2 daughters.  Occasional ETOH.  Quit smoking > 40 years ago.   FH: No premature CAD  ROS: All systems reviewed and negative except as per  HPI.    Current Outpatient Prescriptions  Medication Sig Dispense Refill  . amiodarone (PACERONE) 200 MG tablet Take 1 tablet (200 mg total) by mouth daily. 90 tablet 3  . apixaban (ELIQUIS) 5 MG TABS tablet Take 1 tablet (5 mg total) by mouth 2 (two) times daily. 180 tablet 3  . carvedilol (COREG) 12.5 MG tablet Take 1 tablet (12.5 mg total) by mouth 2 (two) times daily. 180 tablet 3  . cholecalciferol (VITAMIN D) 1000 UNITS tablet Take 1,000 Units by mouth every morning.     . diclofenac (FLECTOR) 1.3 % PTCH Place 1 patch onto the skin 2 (two) times daily as needed (pain).     Marland Kitchen docusate  sodium (COLACE) 100 MG capsule Take 1 capsule (100 mg total) by mouth 2 (two) times daily. 10 capsule 0  . esomeprazole (NEXIUM) 40 MG capsule Take 40 mg by mouth daily at 12 noon.    . furosemide (LASIX) 20 MG tablet Take 1 tablet (20 mg total) by mouth daily. 90 tablet 3  . gabapentin (NEURONTIN) 300 MG capsule Take 600 mg by mouth every evening.     Marland Kitchen lisinopril (PRINIVIL,ZESTRIL) 10 MG tablet Take 1 tablet (10 mg total) by mouth every evening. 90 tablet 3  . lovastatin (ALTOPREV) 40 MG 24 hr tablet Take 40 mg by mouth at bedtime.    . magnesium oxide (MAG-OX) 400 MG tablet Take 400 mg by mouth every morning.     . metFORMIN (GLUCOPHAGE) 1000 MG tablet Take 1,000 mg by mouth 2 (two) times daily with a meal.    . omega-3 acid ethyl esters (LOVAZA) 1 G capsule Take 1 g by mouth 2 (two) times daily.    Marland Kitchen oxyCODONE (ROXICODONE) 5 MG immediate release tablet Take 1 tablet (5 mg total) by mouth every 4 (four) hours as needed for severe pain. 30 tablet 0  . rOPINIRole (REQUIP) 0.5 MG tablet Take 0.5 mg by mouth daily as needed (RLS).     Marland Kitchen sildenafil (VIAGRA) 100 MG tablet Take 100 mg by mouth daily as needed for erectile dysfunction.    Marland Kitchen spironolactone (ALDACTONE) 25 MG tablet Take 25 mg by mouth daily.    Marland Kitchen testosterone cypionate (DEPO-TESTOSTERONE) 200 MG/ML injection Inject 200 mg into the muscle every Thursday.     Marland Kitchen tiZANidine (ZANAFLEX) 4 MG capsule Take 4 mg by mouth 2 (two) times daily as needed for muscle spasms.     Marland Kitchen rOPINIRole (REQUIP) 1 MG tablet Take 1 mg by mouth at bedtime. (Pt also takes one 0.5 mg tablet at night as needed for RLS)     No current facility-administered medications for this encounter.   BP 118/62 mmHg  Pulse 91  Wt 251 lb (113.853 kg)  SpO2 98% General: NAD Neck: No JVD, no thyromegaly or thyroid nodule.  Lungs: Clear to auscultation bilaterally with normal respiratory effort. CV: Nondisplaced PMI.  Heart regular S1/S2, no S3/S4, no murmur.  Trace ankle edema.  No carotid bruit.  Normal pedal pulses.  Abdomen: Soft, nontender, no hepatosplenomegaly, no distention.  Skin: Intact without lesions or rashes.  Neurologic: Alert and oriented x 3.  Psych: Normal affect. Extremities: No clubbing or cyanosis.  Venous varicosities lower legs.   Assessment/Plan: 1. Cardiomyopathy: Nonischemic.  There was a pre-existing possible LBBB cardiomyopathy, but EF was down to 20% in the setting of atrial fibrillation with RVR in 7/15 (suggesting tachycardia-mediated cardiomyopathy).  However, EF did not improve in NSR (20-25% on repeat echo in 10/15).  Now with St Jude CRT-D device.  No significant coronary disease on 7/15 LHC.   Since placement of CRT-D, EF has improved to 50% (4/16 echo).  NYHA class I-II symptoms.  He is euvolemic today on exam, confirmed by Corevue.  He is BiV pacing > 99% of the time. - Continue current Coreg, spironolactone, and lisinopril. - Continue current Lasix, check BMET today.    2. CAD: Nonobstructive, mild.  Continue statin.  No aspirin as he is anticoagulated.  3. Hyperlipidemia: Continue lovastatin.  Myalgias with other statins. Good lipids in 11/16.  4. HTN: BP is controlled.  5. Atrial fibrillation: Paroxysmal, now in NSR on amiodarone 200 mg daily.  Needs to maintain NSR as possible component of tachy-mediated cardiomyopathy.  No atrial fibrillation noted on device interrogation today.  - Continue current Eliquis, will check CBC today.  - Given amiodarone use, will check LFTs/TSH today.  Will need yearly eye exam.   Followup in 4 months.   Loralie Champagne 04/28/2015

## 2015-04-28 NOTE — Patient Instructions (Addendum)
Routine lab work today. Will notify you of abnormal results  Follow up in 4 months with Dr.McLean  

## 2015-05-10 DIAGNOSIS — G894 Chronic pain syndrome: Secondary | ICD-10-CM | POA: Diagnosis not present

## 2015-05-10 DIAGNOSIS — M1288 Other specific arthropathies, not elsewhere classified, other specified site: Secondary | ICD-10-CM | POA: Diagnosis not present

## 2015-05-10 DIAGNOSIS — M25519 Pain in unspecified shoulder: Secondary | ICD-10-CM | POA: Diagnosis not present

## 2015-05-10 DIAGNOSIS — M5137 Other intervertebral disc degeneration, lumbosacral region: Secondary | ICD-10-CM | POA: Diagnosis not present

## 2015-05-10 DIAGNOSIS — Z79899 Other long term (current) drug therapy: Secondary | ICD-10-CM | POA: Diagnosis not present

## 2015-05-11 DIAGNOSIS — I10 Essential (primary) hypertension: Secondary | ICD-10-CM | POA: Diagnosis not present

## 2015-05-11 DIAGNOSIS — E119 Type 2 diabetes mellitus without complications: Secondary | ICD-10-CM | POA: Diagnosis not present

## 2015-05-11 DIAGNOSIS — I4891 Unspecified atrial fibrillation: Secondary | ICD-10-CM | POA: Diagnosis not present

## 2015-05-11 DIAGNOSIS — I429 Cardiomyopathy, unspecified: Secondary | ICD-10-CM | POA: Diagnosis not present

## 2015-05-11 DIAGNOSIS — E785 Hyperlipidemia, unspecified: Secondary | ICD-10-CM | POA: Diagnosis not present

## 2015-05-11 DIAGNOSIS — E291 Testicular hypofunction: Secondary | ICD-10-CM | POA: Diagnosis not present

## 2015-05-11 DIAGNOSIS — N529 Male erectile dysfunction, unspecified: Secondary | ICD-10-CM | POA: Diagnosis not present

## 2015-05-11 DIAGNOSIS — Z0000 Encounter for general adult medical examination without abnormal findings: Secondary | ICD-10-CM | POA: Diagnosis not present

## 2015-05-12 ENCOUNTER — Encounter: Payer: Self-pay | Admitting: Internal Medicine

## 2015-05-13 ENCOUNTER — Encounter (HOSPITAL_COMMUNITY): Payer: Self-pay | Admitting: *Deleted

## 2015-05-18 ENCOUNTER — Telehealth: Payer: Self-pay | Admitting: Cardiology

## 2015-05-18 ENCOUNTER — Encounter: Payer: Medicare Other | Admitting: *Deleted

## 2015-05-18 DIAGNOSIS — M25519 Pain in unspecified shoulder: Secondary | ICD-10-CM | POA: Diagnosis not present

## 2015-05-18 DIAGNOSIS — M19011 Primary osteoarthritis, right shoulder: Secondary | ICD-10-CM | POA: Diagnosis not present

## 2015-05-18 NOTE — Telephone Encounter (Signed)
LMOVM reminding pt to send remote transmission.   

## 2015-05-20 ENCOUNTER — Encounter: Payer: Self-pay | Admitting: Cardiology

## 2015-05-23 ENCOUNTER — Ambulatory Visit (INDEPENDENT_AMBULATORY_CARE_PROVIDER_SITE_OTHER): Payer: Medicare Other | Admitting: *Deleted

## 2015-05-23 DIAGNOSIS — I429 Cardiomyopathy, unspecified: Secondary | ICD-10-CM | POA: Diagnosis not present

## 2015-05-23 DIAGNOSIS — I428 Other cardiomyopathies: Secondary | ICD-10-CM

## 2015-05-25 DIAGNOSIS — M47817 Spondylosis without myelopathy or radiculopathy, lumbosacral region: Secondary | ICD-10-CM | POA: Diagnosis not present

## 2015-05-27 ENCOUNTER — Ambulatory Visit (INDEPENDENT_AMBULATORY_CARE_PROVIDER_SITE_OTHER): Payer: Medicare Other

## 2015-05-27 DIAGNOSIS — I5022 Chronic systolic (congestive) heart failure: Secondary | ICD-10-CM

## 2015-05-27 DIAGNOSIS — Z95810 Presence of automatic (implantable) cardiac defibrillator: Secondary | ICD-10-CM

## 2015-05-27 NOTE — Progress Notes (Signed)
EPIC Encounter for ICM Monitoring  Patient Name: Walter [name] is a 70 y.o. male Date: 05/27/2015 Primary Care Physican: London Pepper, MD Primary Cardiologist: Domenic Polite Electrophysiologist: Allred Dry Weight: 247 lb   Bi-V Pacing >99%      In the past month, have you:  1. Gained more than 2 pounds in a day or more than 5 pounds in a week? no  2. Had changes in your medications (with verification of current medications)? no  3. Had more shortness of breath than is usual for you? no  4. Limited your activity because of shortness of breath? no  5. Not been able to sleep because of shortness of breath? no  6. Had increased swelling in your feet or ankles? no  7. Had symptoms of dehydration (dizziness, dry mouth, increased thirst, decreased urine output) no  8. Had changes in sodium restriction? no  9. Been compliant with medication? Yes   ICM trend: 3 month view for 05/23/2015   ICM trend: 1 year view for 05/23/2015   Follow-up plan: ICM clinic phone appointment on 06/28/2015.  Thoracic impedance below reference line from 05/15/2015 to 05/22/2015 suggesting fluid accumulation.  Thoracic impedance returned to reference line 05/22/2015.  Patient denied any symptoms.    Encouraged to call for any fluid symptoms.  No changes today.     Rosalene Billings, RN, CCM 05/27/2015 3:39 PM

## 2015-05-30 NOTE — Progress Notes (Signed)
Remote ICD transmission.   

## 2015-06-02 LAB — CUP PACEART REMOTE DEVICE CHECK
Brady Statistic AP VP Percent: 3.3 %
Brady Statistic AS VP Percent: 96 %
Brady Statistic AS VS Percent: 1 %
Brady Statistic RA Percent Paced: 3.5 %
Date Time Interrogation Session: 20170306171318
HIGH POWER IMPEDANCE MEASURED VALUE: 74 Ohm
HighPow Impedance: 74 Ohm
Implantable Lead Implant Date: 20151119
Implantable Lead Location: 753859
Lead Channel Impedance Value: 440 Ohm
Lead Channel Pacing Threshold Amplitude: 0.5 V
Lead Channel Pacing Threshold Amplitude: 0.5 V
Lead Channel Pacing Threshold Amplitude: 0.75 V
Lead Channel Pacing Threshold Pulse Width: 0.5 ms
Lead Channel Pacing Threshold Pulse Width: 0.5 ms
Lead Channel Sensing Intrinsic Amplitude: 3.2 mV
Lead Channel Setting Pacing Amplitude: 2 V
Lead Channel Setting Pacing Amplitude: 2.5 V
Lead Channel Setting Pacing Pulse Width: 0.5 ms
Lead Channel Setting Pacing Pulse Width: 0.5 ms
Lead Channel Setting Sensing Sensitivity: 0.5 mV
MDC IDC LEAD IMPLANT DT: 20151119
MDC IDC LEAD IMPLANT DT: 20151119
MDC IDC LEAD LOCATION: 753858
MDC IDC LEAD LOCATION: 753860
MDC IDC MSMT BATTERY REMAINING LONGEVITY: 67 mo
MDC IDC MSMT BATTERY REMAINING PERCENTAGE: 79 %
MDC IDC MSMT BATTERY VOLTAGE: 2.96 V
MDC IDC MSMT LEADCHNL LV IMPEDANCE VALUE: 540 Ohm
MDC IDC MSMT LEADCHNL RA PACING THRESHOLD PULSEWIDTH: 0.5 ms
MDC IDC MSMT LEADCHNL RV IMPEDANCE VALUE: 450 Ohm
MDC IDC MSMT LEADCHNL RV SENSING INTR AMPL: 12 mV
MDC IDC SET LEADCHNL LV PACING AMPLITUDE: 1.5 V
MDC IDC STAT BRADY AP VS PERCENT: 1 %
Pulse Gen Serial Number: 7211486

## 2015-06-03 ENCOUNTER — Encounter: Payer: Self-pay | Admitting: Cardiology

## 2015-06-08 DIAGNOSIS — Z79899 Other long term (current) drug therapy: Secondary | ICD-10-CM | POA: Diagnosis not present

## 2015-06-08 DIAGNOSIS — G894 Chronic pain syndrome: Secondary | ICD-10-CM | POA: Diagnosis not present

## 2015-06-08 DIAGNOSIS — M47817 Spondylosis without myelopathy or radiculopathy, lumbosacral region: Secondary | ICD-10-CM | POA: Diagnosis not present

## 2015-06-28 ENCOUNTER — Telehealth: Payer: Self-pay | Admitting: Cardiology

## 2015-06-28 NOTE — Telephone Encounter (Signed)
LMOVM reminding pt to send remote transmission.   

## 2015-06-30 NOTE — Progress Notes (Signed)
No ICM transmission received on scheduled date, 06/28/2015.  Next remote ICM transmission scheduled for 07/20/2015.

## 2015-07-01 ENCOUNTER — Ambulatory Visit (INDEPENDENT_AMBULATORY_CARE_PROVIDER_SITE_OTHER): Payer: Medicare Other

## 2015-07-01 DIAGNOSIS — Z95810 Presence of automatic (implantable) cardiac defibrillator: Secondary | ICD-10-CM

## 2015-07-01 DIAGNOSIS — I5022 Chronic systolic (congestive) heart failure: Secondary | ICD-10-CM | POA: Diagnosis not present

## 2015-07-01 NOTE — Progress Notes (Signed)
EPIC Encounter for ICM Monitoring  Patient Name: Walter [name] is a 70 y.o. male Date: 07/01/2015 Primary Care Physican: London Pepper, MD Primary Cardiologist: Domenic Polite Electrophysiologist: Allred Dry Weight: 247 lb   Bi-V Pacing >99%      In the past month, have you:  1. Gained more than 2 pounds in a day or more than 5 pounds in a week? no  2. Had changes in your medications (with verification of current medications)? no  3. Had more shortness of breath than is usual for you? no  4. Limited your activity because of shortness of breath? no  5. Not been able to sleep because of shortness of breath? no  6. Had increased swelling in your feet or ankles? no  7. Had symptoms of dehydration (dizziness, dry mouth, increased thirst, decreased urine output) no  8. Had changes in sodium restriction? no  9. Been compliant with medication? Yes   ICM trend: 3 month view for 07/01/2015   ICM trend: 1 year view for 07/01/2015   Follow-up plan: ICM clinic phone appointment on 08/03/2015.  Thoracic impedance below reference line from 06/10/2015 to 06/21/2015 suggesting fluid accumulation and returned to reference line 06/22/2015.  Patient denied fluid symptoms.  He stated he is doing very well at this time.  Education given to limit sodium intake to < 2000 mg and fluid intake to 64 oz daily.  Encouraged to call for any fluid symptoms.  No changes today.    Rosalene Billings, RN, CCM 07/01/2015 2:30 PM

## 2015-07-06 DIAGNOSIS — M19011 Primary osteoarthritis, right shoulder: Secondary | ICD-10-CM | POA: Diagnosis not present

## 2015-07-06 DIAGNOSIS — M25559 Pain in unspecified hip: Secondary | ICD-10-CM | POA: Diagnosis not present

## 2015-07-06 DIAGNOSIS — G894 Chronic pain syndrome: Secondary | ICD-10-CM | POA: Diagnosis not present

## 2015-07-06 DIAGNOSIS — Z79891 Long term (current) use of opiate analgesic: Secondary | ICD-10-CM | POA: Diagnosis not present

## 2015-07-06 DIAGNOSIS — Z79899 Other long term (current) drug therapy: Secondary | ICD-10-CM | POA: Diagnosis not present

## 2015-07-06 DIAGNOSIS — M47817 Spondylosis without myelopathy or radiculopathy, lumbosacral region: Secondary | ICD-10-CM | POA: Diagnosis not present

## 2015-07-20 DIAGNOSIS — H2513 Age-related nuclear cataract, bilateral: Secondary | ICD-10-CM | POA: Diagnosis not present

## 2015-07-20 DIAGNOSIS — E119 Type 2 diabetes mellitus without complications: Secondary | ICD-10-CM | POA: Diagnosis not present

## 2015-08-03 ENCOUNTER — Ambulatory Visit (INDEPENDENT_AMBULATORY_CARE_PROVIDER_SITE_OTHER): Payer: Medicare Other

## 2015-08-03 ENCOUNTER — Telehealth: Payer: Self-pay | Admitting: Cardiology

## 2015-08-03 DIAGNOSIS — Z79891 Long term (current) use of opiate analgesic: Secondary | ICD-10-CM | POA: Diagnosis not present

## 2015-08-03 DIAGNOSIS — Z79899 Other long term (current) drug therapy: Secondary | ICD-10-CM | POA: Diagnosis not present

## 2015-08-03 DIAGNOSIS — M1288 Other specific arthropathies, not elsewhere classified, other specified site: Secondary | ICD-10-CM | POA: Diagnosis not present

## 2015-08-03 DIAGNOSIS — Z95810 Presence of automatic (implantable) cardiac defibrillator: Secondary | ICD-10-CM

## 2015-08-03 DIAGNOSIS — M47816 Spondylosis without myelopathy or radiculopathy, lumbar region: Secondary | ICD-10-CM | POA: Diagnosis not present

## 2015-08-03 DIAGNOSIS — I5022 Chronic systolic (congestive) heart failure: Secondary | ICD-10-CM | POA: Diagnosis not present

## 2015-08-03 DIAGNOSIS — M4697 Unspecified inflammatory spondylopathy, lumbosacral region: Secondary | ICD-10-CM | POA: Diagnosis not present

## 2015-08-03 DIAGNOSIS — G894 Chronic pain syndrome: Secondary | ICD-10-CM | POA: Diagnosis not present

## 2015-08-03 NOTE — Telephone Encounter (Signed)
Spoke with pt and reminded pt of remote transmission that is due today. Pt verbalized understanding.   

## 2015-08-05 NOTE — Progress Notes (Signed)
EPIC Encounter for ICM Monitoring  Patient Name: Walter [name] is a 70 y.o. male Date: 08/05/2015 Primary Care Physican: London Pepper, MD Primary Cardiologist: Domenic Polite Electrophysiologist: Allred Dry Weight: unknown   Bi-V Pacing 99%      In the past month, have you:  1. Gained more than 2 pounds in a day or more than 5 pounds in a week? N/A  2. Had changes in your medications (with verification of current medications)? N/A  3. Had more shortness of breath than is usual for you? N/A  4. Limited your activity because of shortness of breath? N/A  5. Not been able to sleep because of shortness of breath? N/A  6. Had increased swelling in your feet or ankles? N/A  7. Had symptoms of dehydration (dizziness, dry mouth, increased thirst, decreased urine output) N/A  8. Had changes in sodium restriction? N/A  9. Been compliant with medication? N/A   ICM trend: 3 month view for 08/03/2015   ICM trend: 1 year view for 08/03/2015   Follow-up plan: ICM clinic phone appointment on 09/07/2015.  Attempted call to patient and unable to reach.  Transmission reviewed.  Thoracic impedance below reference line from 07/08/2015 to 06/20/2015, 07/21/2015 to 07/26/2015, 07/27/2015 to 07/30/2015 suggesting fluid accumulation and returned to baseline 07/30/2015 suggesting fluid levels have stablized.      Rosalene Billings, RN, CCM 08/05/2015 2:41 PM

## 2015-08-11 ENCOUNTER — Other Ambulatory Visit: Payer: Self-pay | Admitting: Physician Assistant

## 2015-08-11 ENCOUNTER — Other Ambulatory Visit: Payer: Self-pay | Admitting: Nurse Practitioner

## 2015-08-11 DIAGNOSIS — M545 Low back pain, unspecified: Secondary | ICD-10-CM

## 2015-08-11 DIAGNOSIS — M4697 Unspecified inflammatory spondylopathy, lumbosacral region: Secondary | ICD-10-CM

## 2015-08-11 DIAGNOSIS — M5137 Other intervertebral disc degeneration, lumbosacral region: Secondary | ICD-10-CM

## 2015-08-16 ENCOUNTER — Ambulatory Visit
Admission: RE | Admit: 2015-08-16 | Discharge: 2015-08-16 | Disposition: A | Discharge status: Home / Self Care | Payer: Medicare Other | Source: Ambulatory Visit | Attending: Physician Assistant | Admitting: Physician Assistant

## 2015-08-16 DIAGNOSIS — M545 Low back pain, unspecified: Secondary | ICD-10-CM

## 2015-08-16 DIAGNOSIS — M5137 Other intervertebral disc degeneration, lumbosacral region: Secondary | ICD-10-CM

## 2015-08-16 DIAGNOSIS — M51379 Other intervertebral disc degeneration, lumbosacral region without mention of lumbar back pain or lower extremity pain: Secondary | ICD-10-CM

## 2015-08-16 DIAGNOSIS — M4806 Spinal stenosis, lumbar region: Secondary | ICD-10-CM | POA: Diagnosis not present

## 2015-08-16 DIAGNOSIS — M4697 Unspecified inflammatory spondylopathy, lumbosacral region: Secondary | ICD-10-CM

## 2015-08-18 DIAGNOSIS — I4891 Unspecified atrial fibrillation: Secondary | ICD-10-CM | POA: Diagnosis not present

## 2015-08-18 DIAGNOSIS — E119 Type 2 diabetes mellitus without complications: Secondary | ICD-10-CM | POA: Diagnosis not present

## 2015-08-18 DIAGNOSIS — E291 Testicular hypofunction: Secondary | ICD-10-CM | POA: Diagnosis not present

## 2015-08-18 DIAGNOSIS — I10 Essential (primary) hypertension: Secondary | ICD-10-CM | POA: Diagnosis not present

## 2015-08-18 DIAGNOSIS — E785 Hyperlipidemia, unspecified: Secondary | ICD-10-CM | POA: Diagnosis not present

## 2015-08-18 DIAGNOSIS — Z7984 Long term (current) use of oral hypoglycemic drugs: Secondary | ICD-10-CM | POA: Diagnosis not present

## 2015-08-18 DIAGNOSIS — R202 Paresthesia of skin: Secondary | ICD-10-CM | POA: Diagnosis not present

## 2015-08-22 DIAGNOSIS — M1611 Unilateral primary osteoarthritis, right hip: Secondary | ICD-10-CM | POA: Diagnosis not present

## 2015-08-25 ENCOUNTER — Ambulatory Visit (INDEPENDENT_AMBULATORY_CARE_PROVIDER_SITE_OTHER): Payer: Medicare Other | Admitting: *Deleted

## 2015-08-25 DIAGNOSIS — Z95810 Presence of automatic (implantable) cardiac defibrillator: Secondary | ICD-10-CM | POA: Diagnosis not present

## 2015-08-25 DIAGNOSIS — I429 Cardiomyopathy, unspecified: Secondary | ICD-10-CM

## 2015-08-25 DIAGNOSIS — I428 Other cardiomyopathies: Secondary | ICD-10-CM

## 2015-08-26 ENCOUNTER — Encounter (HOSPITAL_COMMUNITY): Payer: Self-pay

## 2015-08-26 ENCOUNTER — Ambulatory Visit (HOSPITAL_COMMUNITY)
Admission: RE | Admit: 2015-08-26 | Discharge: 2015-08-26 | Disposition: A | Discharge status: Home / Self Care | Payer: Medicare Other | Source: Ambulatory Visit | Attending: Family Medicine | Admitting: Family Medicine

## 2015-08-26 VITALS — BP 120/71 | HR 68 | Resp 18 | Wt 255.0 lb

## 2015-08-26 DIAGNOSIS — G2581 Restless legs syndrome: Secondary | ICD-10-CM | POA: Diagnosis not present

## 2015-08-26 DIAGNOSIS — I2510 Atherosclerotic heart disease of native coronary artery without angina pectoris: Secondary | ICD-10-CM | POA: Insufficient documentation

## 2015-08-26 DIAGNOSIS — Z95810 Presence of automatic (implantable) cardiac defibrillator: Secondary | ICD-10-CM | POA: Diagnosis not present

## 2015-08-26 DIAGNOSIS — Z7984 Long term (current) use of oral hypoglycemic drugs: Secondary | ICD-10-CM | POA: Insufficient documentation

## 2015-08-26 DIAGNOSIS — E785 Hyperlipidemia, unspecified: Secondary | ICD-10-CM | POA: Insufficient documentation

## 2015-08-26 DIAGNOSIS — Z7901 Long term (current) use of anticoagulants: Secondary | ICD-10-CM | POA: Insufficient documentation

## 2015-08-26 DIAGNOSIS — Z8547 Personal history of malignant neoplasm of testis: Secondary | ICD-10-CM | POA: Diagnosis not present

## 2015-08-26 DIAGNOSIS — D6851 Activated protein C resistance: Secondary | ICD-10-CM | POA: Insufficient documentation

## 2015-08-26 DIAGNOSIS — Z79899 Other long term (current) drug therapy: Secondary | ICD-10-CM | POA: Insufficient documentation

## 2015-08-26 DIAGNOSIS — I519 Heart disease, unspecified: Secondary | ICD-10-CM

## 2015-08-26 DIAGNOSIS — E119 Type 2 diabetes mellitus without complications: Secondary | ICD-10-CM | POA: Diagnosis not present

## 2015-08-26 DIAGNOSIS — I480 Paroxysmal atrial fibrillation: Secondary | ICD-10-CM | POA: Diagnosis not present

## 2015-08-26 DIAGNOSIS — K219 Gastro-esophageal reflux disease without esophagitis: Secondary | ICD-10-CM | POA: Diagnosis not present

## 2015-08-26 DIAGNOSIS — Z96641 Presence of right artificial hip joint: Secondary | ICD-10-CM | POA: Diagnosis not present

## 2015-08-26 DIAGNOSIS — D751 Secondary polycythemia: Secondary | ICD-10-CM | POA: Diagnosis not present

## 2015-08-26 DIAGNOSIS — I10 Essential (primary) hypertension: Secondary | ICD-10-CM | POA: Insufficient documentation

## 2015-08-26 DIAGNOSIS — I428 Other cardiomyopathies: Secondary | ICD-10-CM | POA: Diagnosis not present

## 2015-08-26 LAB — COMPREHENSIVE METABOLIC PANEL
ALBUMIN: 3.9 g/dL (ref 3.5–5.0)
ALT: 18 U/L (ref 17–63)
AST: 19 U/L (ref 15–41)
Alkaline Phosphatase: 57 U/L (ref 38–126)
Anion gap: 7 (ref 5–15)
BUN: 27 mg/dL — AB (ref 6–20)
CHLORIDE: 100 mmol/L — AB (ref 101–111)
CO2: 26 mmol/L (ref 22–32)
Calcium: 8.8 mg/dL — ABNORMAL LOW (ref 8.9–10.3)
Creatinine, Ser: 1.36 mg/dL — ABNORMAL HIGH (ref 0.61–1.24)
GFR calc Af Amer: 59 mL/min — ABNORMAL LOW (ref >60)
GFR, EST NON AFRICAN AMERICAN: 51 mL/min — AB (ref >60)
GLUCOSE: 106 mg/dL — AB (ref 65–99)
POTASSIUM: 4.8 mmol/L (ref 3.5–5.1)
SODIUM: 133 mmol/L — AB (ref 135–145)
Total Bilirubin: 1 mg/dL (ref 0.3–1.2)
Total Protein: 6 g/dL — ABNORMAL LOW (ref 6.5–8.1)

## 2015-08-26 LAB — CBC
HEMATOCRIT: 48 % (ref 39.0–52.0)
Hemoglobin: 15.5 g/dL (ref 13.0–17.0)
MCH: 30.5 pg (ref 26.0–34.0)
MCHC: 32.3 g/dL (ref 30.0–36.0)
MCV: 94.3 fL (ref 78.0–100.0)
Platelets: 180 10*3/uL (ref 150–400)
RBC: 5.09 MIL/uL (ref 4.22–5.81)
RDW: 14 % (ref 11.5–15.5)
WBC: 6.8 10*3/uL (ref 4.0–10.5)

## 2015-08-26 LAB — T4, FREE: FREE T4: 1.2 ng/dL — AB (ref 0.61–1.12)

## 2015-08-26 LAB — TSH: TSH: 4.176 u[IU]/mL (ref 0.350–4.500)

## 2015-08-26 MED ORDER — FUROSEMIDE 40 MG PO TABS: 40.0000 mg | ORAL_TABLET | ORAL | Status: DC

## 2015-08-26 NOTE — Progress Notes (Signed)
Remote ICD transmission.   

## 2015-08-26 NOTE — Patient Instructions (Addendum)
Routine lab work today. Will notify you of abnormal results, otherwise no news is good news!  Continue taking Lasix 40 mg every other day.  Will call you to schedule you for an echocardiogram at [facility] in 5 months. Address: [address] ([place]), [place], [place] [address]  Phone: [phone]  Will call you closer to time to schedule 6 month follow up with Dr. Aundra Dubin.  Do the following things EVERYDAY: 1) Weigh yourself in the morning before breakfast. Write it down and keep it in a log. 2) Take your medicines as prescribed 3) Eat low salt foods-Limit salt (sodium) to 2000 mg per day.  4) Stay as active as you can everyday 5) Limit all fluids for the day to less than 2 liters

## 2015-08-27 LAB — T3, FREE: T3, Free: 3.4 pg/mL (ref 2.0–4.4)

## 2015-08-27 NOTE — Progress Notes (Signed)
Patient ID: Walter [name], male   DOB: September 19, 1945, 70 y.o.   MRN: [id number] PCP: Dr. Orland Mustard  70 yo with history of paroxysmal atrial fibrillation, chronic LBBB and cardiomyopathy presents for cardiology evaluation.  He was found to have LBBB 15-20 years ago.  In 2011, he had a stress test in [facility] that was abnormal so he was taken for [facility] in 6/11 that showed nonobstructive disease.  He had an echo in 5/11 that showed EF 45-50%, diffuse hypokinesis suggesting a mild nonischemic cardiomyopathy.   He has admitted in 7/15 with atrial fibrillation with RVR.  Rate was very difficult to control.  TEE failed due to inability to pass probe x 2.  Eventually, ICE was done to rule out LAA thrombus and he was cardioverted to NSR.  He is on amiodarone now and in NSR.  Echo in 7/15 showed EF 20% with diffuse hypokinesis in setting of atrial fibrillation/RVR.  LHC was also done in 7/15 with minimal coronary disease.  Repeat echo in 10/15 showed EF 20-25%.  In 11/15, he had St Jude CRT-D device placed.  Repeat echo in 4/16 showed EF improved to 50% with mild LVH.   He had right THR in [id number], uncomplicated.  He did develop dyspnea and edema off Lasix after the procedure so Lasix was restarted.   He is now on Eliquis with no BRBPR or melena.  Weight is down 4 lbs.  No exertional dyspnea.  Not walking as much as he pulled a muscle in his groin.  No chest pain.  No orthopnea or PND.  No palpitations.  He is in NSR today.    Corevue checked today, impedance stable and BiV pacing >99% of the time.  No atrial fibrillation.    Labs (11/14): K 3.9, creatinine 1.14, LDL 104, LFTs normal Labs (2/15): HCT 56.3 Labs (9/15): K 4.1, creatinine 1.1, digoxin 0.9, TSH normal, LFTs normal Labs (10/15): TGs 262, LDL 82, BNP 68 Labs (11/15): K 4.1, creatinine 1.12 Labs (12/15): K 4.8, creatinine 1.02 Labs (3/16): K 4.6, creatinine 1.06, digoxin 0.8, LFTs normal, TSH normal Labs (11/16): K 4.2, creatinine 1.17 => 1.3, HCT 56.2,  TSH normal, LDL 75, HDL 34, LFTs normal Labs (2/17): K 4.4, creatinine 1.18, BNP 20, HCT 50.8, LFTs normal  PMH: 1. Type II diabetes 2. HTN 3. Adrenal nodule 4. Restless leg syndrome 5. OA with right hip pain, right frozen shoulder, low back pain. S/p R THR in 12/16.  6. Testicular cancer: 1972 7. Chronic LBBB: Had Cardiolite in 2011 that was abnormal so had LHC in 6/11 in [place], [place].  This showed 30% LCx stenosis and 40% ostial RCA stenosis.  LHC in 7/15 in [place] showed minimal coronary disease.  8. 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  Echo (5/11) with EF 45-50%, moderate LVH.  Echo (7/15) with EF 20%, diffuse hypokinesis, mild to moderately decreased RV systolic function (in setting of afib with RVR). LHC (7/15) with minimal coronary disease.  Echo (10/15) with EF 20-25%, mild LVH, moderate LAE. St Jude CRT-D device placed in 11/15. Echo (4/16) with EF 50%, mild LVH.  9. Factor V Leiden + 10. Asbestosis: Mild.  Exposure while in the [employer].  11. GERD 12. PVCs 13. Polycythemia: Uncertain etiology 14. Hyperlipidemia: Myalgias with Zocor and Lipitor.  15. Atrial fibrillation: Paroxysmal.  Unable to pass TEE probe.   SH: Retired from [employer], lives with wife in [place].  2 daughters.  Occasional ETOH.  Quit  smoking > 40 years ago.   FH: No premature CAD  ROS: All systems reviewed and negative except as per HPI.    Current Outpatient Prescriptions  Medication Sig Dispense Refill  . amiodarone (PACERONE) 200 MG tablet Take 1 tablet (200 mg total) by mouth daily. 90 tablet 3  . apixaban (ELIQUIS) 5 MG TABS tablet Take 1 tablet (5 mg total) by mouth 2 (two) times daily. 180 tablet 3  . carvedilol (COREG) 12.5 MG tablet Take 1 tablet (12.5 mg total) by mouth 2 (two) times daily. 180 tablet 3  . cholecalciferol (VITAMIN D) 1000 UNITS tablet Take 1,000 Units by mouth every morning.     . diclofenac  (FLECTOR) 1.3 % PTCH Place 1 patch onto the skin 2 (two) times daily as needed (pain).     Marland Kitchen docusate sodium (COLACE) 100 MG capsule Take 1 capsule (100 mg total) by mouth 2 (two) times daily. 10 capsule 0  . esomeprazole (NEXIUM) 40 MG capsule Take 40 mg by mouth daily at 12 noon.    . furosemide (LASIX) 40 MG tablet Take 1 tablet (40 mg total) by mouth every other day. 45 tablet 3  . gabapentin (NEURONTIN) 300 MG capsule Take 600 mg by mouth every evening.     Marland Kitchen lisinopril (PRINIVIL,ZESTRIL) 10 MG tablet Take 1 tablet (10 mg total) by mouth every evening. 90 tablet 3  . lovastatin (ALTOPREV) 40 MG 24 hr tablet Take 40 mg by mouth at bedtime.    . magnesium oxide (MAG-OX) 400 MG tablet Take 400 mg by mouth every morning.     . metFORMIN (GLUCOPHAGE) 1000 MG tablet Take 1,000 mg by mouth 2 (two) times daily with a meal.    . omega-3 acid ethyl esters (LOVAZA) 1 G capsule Take 1 g by mouth 2 (two) times daily.    Marland Kitchen oxyCODONE (ROXICODONE) 5 MG immediate release tablet Take 1 tablet (5 mg total) by mouth every 4 (four) hours as needed for severe pain. 30 tablet 0  . rOPINIRole (REQUIP) 0.5 MG tablet Take 0.5 mg by mouth daily as needed (RLS).     Marland Kitchen sildenafil (VIAGRA) 100 MG tablet Take 100 mg by mouth daily as needed for erectile dysfunction.    Marland Kitchen spironolactone (ALDACTONE) 25 MG tablet Take 25 mg by mouth daily.    Marland Kitchen testosterone cypionate (DEPO-TESTOSTERONE) 200 MG/ML injection Inject 200 mg into the muscle every Thursday.     Marland Kitchen tiZANidine (ZANAFLEX) 4 MG capsule Take 4 mg by mouth 2 (two) times daily as needed for muscle spasms.      No current facility-administered medications for this encounter.   BP 120/71 mmHg  Pulse 68  Resp 18  Wt 255 lb (115.667 kg)  SpO2 96% General: NAD Neck: No JVD, no thyromegaly or thyroid nodule.  Lungs: Clear to auscultation bilaterally with normal respiratory effort. CV: Nondisplaced PMI.  Heart regular S1/S2, no S3/S4, no murmur.  Trace ankle edema. No  carotid bruit.  Normal pedal pulses.  Abdomen: Soft, nontender, no hepatosplenomegaly, no distention.  Skin: Intact without lesions or rashes.  Neurologic: Alert and oriented x 3.  Psych: Normal affect. Extremities: No clubbing or cyanosis.  Venous varicosities lower legs.   Assessment/Plan: 1. Cardiomyopathy: Nonischemic.  There was a pre-existing possible LBBB cardiomyopathy, but EF was down to 20% in the setting of atrial fibrillation with RVR in 7/15 (suggesting tachycardia-mediated cardiomyopathy).  However, EF did not improve in NSR (20-25% on repeat echo in 10/15).  Now with  St Jude CRT-D device.  No significant coronary disease on 7/15 LHC.   Since placement of CRT-D, EF has improved to 50% (4/16 echo).  NYHA class I-II symptoms.  He is euvolemic today on exam, confirmed by Corevue.  He is BiV pacing > 99% of the time. - Continue current Coreg, spironolactone, and lisinopril. - Continue current Lasix, check BMET today.   - Will repeat echo to reassess EF.  2. CAD: Nonobstructive, mild.  Continue statin.  No aspirin as he is anticoagulated.  3. Hyperlipidemia: Continue lovastatin.  Myalgias with other statins. Good lipids in 11/16.  4. HTN: BP is controlled.  5. Atrial fibrillation: Paroxysmal, now in NSR on amiodarone 200 mg daily.  Needs to maintain NSR as possible component of tachy-mediated cardiomyopathy.  No atrial fibrillation noted on device interrogation today.  - Continue current Eliquis, will check CBC today.  - Given amiodarone use, will check LFTs today. Last TSH was low, check TSH/free T4/free T3.  Will need yearly eye exam.   Followup in 6 months if echo stable.   Loralie Champagne 08/27/2015

## 2015-08-31 DIAGNOSIS — Z79891 Long term (current) use of opiate analgesic: Secondary | ICD-10-CM | POA: Diagnosis not present

## 2015-08-31 DIAGNOSIS — G894 Chronic pain syndrome: Secondary | ICD-10-CM | POA: Diagnosis not present

## 2015-08-31 DIAGNOSIS — M47816 Spondylosis without myelopathy or radiculopathy, lumbar region: Secondary | ICD-10-CM | POA: Diagnosis not present

## 2015-08-31 DIAGNOSIS — Z79899 Other long term (current) drug therapy: Secondary | ICD-10-CM | POA: Diagnosis not present

## 2015-09-02 LAB — CUP PACEART MISC DEVICE CHECK
Battery Remaining Longevity: 64 mo
Brady Statistic AS VS Percent: 1 %
Date Time Interrogation Session: 20170609124728
HIGH POWER IMPEDANCE MEASURED VALUE: 77 Ohm
HIGH POWER IMPEDANCE MEASURED VALUE: 77 Ohm
Implantable Lead Implant Date: 20151119
Implantable Lead Implant Date: 20151119
Implantable Lead Location: 753858
Lead Channel Pacing Threshold Amplitude: 0.5 V
Lead Channel Sensing Intrinsic Amplitude: 2.7 mV
Lead Channel Setting Pacing Amplitude: 2.5 V
Lead Channel Setting Pacing Pulse Width: 0.5 ms
Lead Channel Setting Sensing Sensitivity: 0.5 mV
MDC IDC LEAD IMPLANT DT: 20151119
MDC IDC LEAD LOCATION: 753859
MDC IDC LEAD LOCATION: 753860
MDC IDC MSMT BATTERY REMAINING PERCENTAGE: 76 %
MDC IDC MSMT BATTERY VOLTAGE: 2.96 V
MDC IDC MSMT LEADCHNL LV IMPEDANCE VALUE: 530 Ohm
MDC IDC MSMT LEADCHNL LV PACING THRESHOLD AMPLITUDE: 0.5 V
MDC IDC MSMT LEADCHNL LV PACING THRESHOLD PULSEWIDTH: 0.5 ms
MDC IDC MSMT LEADCHNL RA IMPEDANCE VALUE: 430 Ohm
MDC IDC MSMT LEADCHNL RA PACING THRESHOLD PULSEWIDTH: 0.5 ms
MDC IDC MSMT LEADCHNL RV IMPEDANCE VALUE: 390 Ohm
MDC IDC MSMT LEADCHNL RV PACING THRESHOLD AMPLITUDE: 0.75 V
MDC IDC MSMT LEADCHNL RV PACING THRESHOLD PULSEWIDTH: 0.5 ms
MDC IDC MSMT LEADCHNL RV SENSING INTR AMPL: 12 mV
MDC IDC SET LEADCHNL LV PACING AMPLITUDE: 1.5 V
MDC IDC SET LEADCHNL RA PACING AMPLITUDE: 2 V
MDC IDC SET LEADCHNL RV PACING PULSEWIDTH: 0.5 ms
MDC IDC STAT BRADY AP VP PERCENT: 5.6 %
MDC IDC STAT BRADY AP VS PERCENT: 1 %
MDC IDC STAT BRADY AS VP PERCENT: 93 %
MDC IDC STAT BRADY RA PERCENT PACED: 5.6 %
Pulse Gen Serial Number: 7211486

## 2015-09-07 ENCOUNTER — Encounter: Payer: Self-pay | Admitting: Cardiology

## 2015-09-07 ENCOUNTER — Telehealth: Payer: Self-pay | Admitting: Cardiology

## 2015-09-07 ENCOUNTER — Ambulatory Visit (INDEPENDENT_AMBULATORY_CARE_PROVIDER_SITE_OTHER): Payer: Medicare Other

## 2015-09-07 DIAGNOSIS — Z95810 Presence of automatic (implantable) cardiac defibrillator: Secondary | ICD-10-CM | POA: Diagnosis not present

## 2015-09-07 DIAGNOSIS — I5022 Chronic systolic (congestive) heart failure: Secondary | ICD-10-CM | POA: Diagnosis not present

## 2015-09-07 NOTE — Telephone Encounter (Signed)
LMOVM reminding pt to send remote transmission.   

## 2015-09-08 ENCOUNTER — Telehealth: Payer: Self-pay

## 2015-09-08 DIAGNOSIS — D239 Other benign neoplasm of skin, unspecified: Secondary | ICD-10-CM | POA: Diagnosis not present

## 2015-09-08 DIAGNOSIS — D2112 Benign neoplasm of connective and other soft tissue of left upper limb, including shoulder: Secondary | ICD-10-CM | POA: Diagnosis not present

## 2015-09-08 NOTE — Telephone Encounter (Signed)
Remote ICM transmission received.  Attempted patient call and person answering phone stated he was not home.    

## 2015-09-08 NOTE — Progress Notes (Signed)
EPIC Encounter for ICM Monitoring  Patient Name: Walter [name] is a 70 y.o. male Date: 09/08/2015 Primary Care Physican: London Pepper, MD Primary Cardiologist: Domenic Polite Electrophysiologist: Allred Dry Weight: unknown  Bi-V Pacing 99%      In the past month, have you:  1. Gained more than 2 pounds in a day or more than 5 pounds in a week? N/A  2. Had changes in your medications (with verification of current medications)? N/A  3. Had more shortness of breath than is usual for you? N/A  4. Limited your activity because of shortness of breath? N/A  5. Not been able to sleep because of shortness of breath? N/A  6. Had increased swelling in your feet, ankles, legs or stomach area? N/A  7. Had symptoms of dehydration (dizziness, dry mouth, increased thirst, decreased urine output) N/A  8. Had changes in sodium restriction? N/A  9. Been compliant with medication? N/A  ICM trend: 3 month view for 09/08/2015   ICM trend: 1 year view for 09/08/2015   Follow-up plan: ICM clinic phone appointment 10/12/2015.  Attempted call to patient and unable to reach.  Transmission reviewed.  FLUID LEVELS: Corvue thoracic impedance decreased 08/27/2015 to 09/05/2015 suggesting fluid accumulation and returned to baseline 09/08/2015 suggesting fluid levels are starting to stablize.       Rosalene Billings, RN, CCM 09/08/2015 2:46 PM

## 2015-09-13 ENCOUNTER — Encounter: Payer: Self-pay | Admitting: Internal Medicine

## 2015-09-28 DIAGNOSIS — M47816 Spondylosis without myelopathy or radiculopathy, lumbar region: Secondary | ICD-10-CM | POA: Diagnosis not present

## 2015-09-28 DIAGNOSIS — G894 Chronic pain syndrome: Secondary | ICD-10-CM | POA: Diagnosis not present

## 2015-09-28 DIAGNOSIS — Z79899 Other long term (current) drug therapy: Secondary | ICD-10-CM | POA: Diagnosis not present

## 2015-09-28 DIAGNOSIS — M47817 Spondylosis without myelopathy or radiculopathy, lumbosacral region: Secondary | ICD-10-CM | POA: Diagnosis not present

## 2015-09-28 DIAGNOSIS — Z79891 Long term (current) use of opiate analgesic: Secondary | ICD-10-CM | POA: Diagnosis not present

## 2015-10-12 ENCOUNTER — Telehealth: Payer: Self-pay | Admitting: Cardiology

## 2015-10-12 NOTE — Telephone Encounter (Signed)
LMOVM reminding pt to send remote transmission.   

## 2015-10-17 NOTE — Progress Notes (Addendum)
No ICM remote transmission received that was scheduled for 10/12/2015.   Next ICM remote transmission 11/24/2015.

## 2015-10-25 NOTE — Progress Notes (Signed)
Spoke with patient in the office.  He reported having difficulty sending manual transmissions and has worked with tech services but still have difficulty sending.  Advised to call tech services again and check if a new monitor can be sent and to inform tech services that the transmission is not sending manual or automatically.  He stated he will call tech services again.  Advise to call if a manual transmission is sent.  He stated he would do so.

## 2015-10-26 DIAGNOSIS — Z79899 Other long term (current) drug therapy: Secondary | ICD-10-CM | POA: Diagnosis not present

## 2015-10-26 DIAGNOSIS — Z79891 Long term (current) use of opiate analgesic: Secondary | ICD-10-CM | POA: Diagnosis not present

## 2015-10-26 DIAGNOSIS — M4697 Unspecified inflammatory spondylopathy, lumbosacral region: Secondary | ICD-10-CM | POA: Diagnosis not present

## 2015-10-26 DIAGNOSIS — M1990 Unspecified osteoarthritis, unspecified site: Secondary | ICD-10-CM | POA: Diagnosis not present

## 2015-10-26 DIAGNOSIS — G894 Chronic pain syndrome: Secondary | ICD-10-CM | POA: Diagnosis not present

## 2015-10-26 DIAGNOSIS — M47816 Spondylosis without myelopathy or radiculopathy, lumbar region: Secondary | ICD-10-CM | POA: Diagnosis not present

## 2015-11-24 ENCOUNTER — Ambulatory Visit (INDEPENDENT_AMBULATORY_CARE_PROVIDER_SITE_OTHER): Payer: Medicare Other | Admitting: *Deleted

## 2015-11-24 ENCOUNTER — Telehealth: Payer: Self-pay | Admitting: Cardiology

## 2015-11-24 DIAGNOSIS — Z95810 Presence of automatic (implantable) cardiac defibrillator: Secondary | ICD-10-CM | POA: Diagnosis not present

## 2015-11-24 DIAGNOSIS — G894 Chronic pain syndrome: Secondary | ICD-10-CM | POA: Diagnosis not present

## 2015-11-24 DIAGNOSIS — Z79891 Long term (current) use of opiate analgesic: Secondary | ICD-10-CM | POA: Diagnosis not present

## 2015-11-24 DIAGNOSIS — Z79899 Other long term (current) drug therapy: Secondary | ICD-10-CM | POA: Diagnosis not present

## 2015-11-24 DIAGNOSIS — M1288 Other specific arthropathies, not elsewhere classified, other specified site: Secondary | ICD-10-CM | POA: Diagnosis not present

## 2015-11-24 DIAGNOSIS — M5137 Other intervertebral disc degeneration, lumbosacral region: Secondary | ICD-10-CM | POA: Diagnosis not present

## 2015-11-24 DIAGNOSIS — I428 Other cardiomyopathies: Secondary | ICD-10-CM

## 2015-11-24 DIAGNOSIS — M1990 Unspecified osteoarthritis, unspecified site: Secondary | ICD-10-CM | POA: Diagnosis not present

## 2015-11-24 DIAGNOSIS — I429 Cardiomyopathy, unspecified: Secondary | ICD-10-CM | POA: Diagnosis not present

## 2015-11-24 NOTE — Telephone Encounter (Signed)
Spoke with pt and reminded pt of remote transmission that is due today. Pt verbalized understanding.   

## 2015-11-25 ENCOUNTER — Telehealth: Payer: Self-pay

## 2015-11-25 NOTE — Progress Notes (Signed)
EPIC Encounter for ICM Monitoring  Patient Name: Walter [name] is a 70 y.o. male Date: 11/25/2015 Primary Care Physican: London Pepper, MD Primary Cardiologist: Domenic Polite Electrophysiologist: Allred Dry Weight: unknown Bi-V Pacing:  98%       Attempted ICM call and unable to reach.  Transmission reviewed.   Thoracic impedance normal.  Follow-up plan: ICM clinic phone appointment on 12/28/2015.  Copy of ICM check sent to device physician.   ICM trend: 11/24/2015       Rosalene Billings, RN 11/25/2015 11:47 AM

## 2015-11-25 NOTE — Telephone Encounter (Signed)
Remote ICM transmission received.  Attempted patient call and left message for return call.   

## 2015-11-25 NOTE — Progress Notes (Signed)
Remote ICD transmission.   

## 2015-11-28 ENCOUNTER — Encounter: Payer: Self-pay | Admitting: Cardiology

## 2015-11-30 DIAGNOSIS — Z23 Encounter for immunization: Secondary | ICD-10-CM | POA: Diagnosis not present

## 2015-11-30 DIAGNOSIS — E538 Deficiency of other specified B group vitamins: Secondary | ICD-10-CM | POA: Diagnosis not present

## 2015-11-30 DIAGNOSIS — R202 Paresthesia of skin: Secondary | ICD-10-CM | POA: Diagnosis not present

## 2015-11-30 DIAGNOSIS — I10 Essential (primary) hypertension: Secondary | ICD-10-CM | POA: Diagnosis not present

## 2015-11-30 DIAGNOSIS — E119 Type 2 diabetes mellitus without complications: Secondary | ICD-10-CM | POA: Diagnosis not present

## 2015-11-30 DIAGNOSIS — E291 Testicular hypofunction: Secondary | ICD-10-CM | POA: Diagnosis not present

## 2015-11-30 DIAGNOSIS — M19011 Primary osteoarthritis, right shoulder: Secondary | ICD-10-CM | POA: Diagnosis not present

## 2015-11-30 DIAGNOSIS — I4891 Unspecified atrial fibrillation: Secondary | ICD-10-CM | POA: Diagnosis not present

## 2015-11-30 DIAGNOSIS — M25519 Pain in unspecified shoulder: Secondary | ICD-10-CM | POA: Diagnosis not present

## 2015-11-30 DIAGNOSIS — E785 Hyperlipidemia, unspecified: Secondary | ICD-10-CM | POA: Diagnosis not present

## 2015-12-05 LAB — CUP PACEART REMOTE DEVICE CHECK
Brady Statistic AP VP Percent: 7.5 %
Brady Statistic AP VS Percent: 1 %
Brady Statistic AS VS Percent: 1.5 %
Brady Statistic RA Percent Paced: 7.2 %
Date Time Interrogation Session: 20170908035154
HIGH POWER IMPEDANCE MEASURED VALUE: 73 Ohm
HighPow Impedance: 73 Ohm
Implantable Lead Implant Date: 20151119
Implantable Lead Location: 753859
Lead Channel Impedance Value: 440 Ohm
Lead Channel Pacing Threshold Amplitude: 0.5 V
Lead Channel Pacing Threshold Amplitude: 0.5 V
Lead Channel Pacing Threshold Amplitude: 0.75 V
Lead Channel Pacing Threshold Pulse Width: 0.5 ms
Lead Channel Pacing Threshold Pulse Width: 0.5 ms
Lead Channel Sensing Intrinsic Amplitude: 3.8 mV
Lead Channel Setting Pacing Amplitude: 2.5 V
Lead Channel Setting Pacing Pulse Width: 0.5 ms
Lead Channel Setting Pacing Pulse Width: 0.5 ms
Lead Channel Setting Sensing Sensitivity: 0.5 mV
MDC IDC LEAD IMPLANT DT: 20151119
MDC IDC LEAD IMPLANT DT: 20151119
MDC IDC LEAD LOCATION: 753858
MDC IDC LEAD LOCATION: 753860
MDC IDC MSMT BATTERY REMAINING LONGEVITY: 61 mo
MDC IDC MSMT BATTERY REMAINING PERCENTAGE: 72 %
MDC IDC MSMT BATTERY VOLTAGE: 2.96 V
MDC IDC MSMT LEADCHNL LV IMPEDANCE VALUE: 550 Ohm
MDC IDC MSMT LEADCHNL RA PACING THRESHOLD PULSEWIDTH: 0.5 ms
MDC IDC MSMT LEADCHNL RV IMPEDANCE VALUE: 410 Ohm
MDC IDC MSMT LEADCHNL RV SENSING INTR AMPL: 12 mV
MDC IDC PG SERIAL: 7211486
MDC IDC SET LEADCHNL LV PACING AMPLITUDE: 1.5 V
MDC IDC SET LEADCHNL RA PACING AMPLITUDE: 2 V
MDC IDC STAT BRADY AS VP PERCENT: 90 %

## 2015-12-08 ENCOUNTER — Telehealth: Payer: Self-pay | Admitting: Cardiology

## 2015-12-08 NOTE — Telephone Encounter (Signed)
Spoke w/ pt wife and requested that pt send a manual transmission b/c his home monitor has not updated in at least 8 days.   

## 2015-12-09 ENCOUNTER — Encounter: Payer: Self-pay | Admitting: Neurology

## 2015-12-09 ENCOUNTER — Ambulatory Visit (INDEPENDENT_AMBULATORY_CARE_PROVIDER_SITE_OTHER): Payer: Medicare Other | Admitting: Neurology

## 2015-12-09 DIAGNOSIS — G250 Essential tremor: Secondary | ICD-10-CM | POA: Insufficient documentation

## 2015-12-09 HISTORY — DX: Essential tremor: G25.0

## 2015-12-09 NOTE — Patient Instructions (Addendum)
Essential Tremor A tremor is trembling or shaking that you cannot control. Most tremors affect the hands or arms. Tremors can also affect the head, vocal cords, face, and other parts of the body.  Essential tremor is a tremor without a known cause.  CAUSES Essential tremor has no known cause.  RISK FACTORS You may be at greater risk of essential tremor if:   You have a family member with essential tremor.   You are age 72 or older.   You take certain medicines. SIGNS AND SYMPTOMS The main sign of a tremor is uncontrolled and unintentional rhythmic shaking of a body part.  You may have difficulty eating with a spoon or fork.   You may have difficulty writing.   You may nod your head up and down or side to side.   You may have a quivering voice.  Your tremors:  May get worse over time.   May come and go.   May be more noticeable on one side of your body.   May get worse due to stress, fatigue, caffeine, and extreme heat or cold.  DIAGNOSIS Your health care provider can diagnose essential tremor based on your symptoms, medical history, and a physical examination. There is no single test to diagnose an essential tremor. However, your health care provider may perform a variety of tests to rule out other conditions. Tests may include:   Blood and urine tests.   Imaging studies of your brain, such as:   CT scan.   MRI.   A test that measures involuntary muscle movement (electromyogram). TREATMENT Your tremors may go away without treatment. Mild tremors may not need treatment if they do not affect your day-to-day life. Severe tremors may need to be treated using one or a combination of the following options:   Medicines. This may include medicine that is injected.  Lifestyle changes.   Physical therapy.  HOME CARE INSTRUCTIONS  Take medicines only as directed by your health care provider.   Limit alcohol intake to no more than 1 drink per day for  nonpregnant women and 2 drinks per day for men. One drink equals 12 oz of beer, 5 oz of wine, or 1 oz of hard liquor.  Do not use any tobacco products, including cigarettes, chewing tobacco, or electronic cigarettes. If you need help quitting, ask your health care provider.  Take medicines only as directed by your health care provider.   Avoid extreme heat or cold.   Limit the amount of caffeine you consumeas directed by your health care provider.   Try to get eight hours of sleep each night.  Find ways to manage your stress, such as meditation or yoga.  Keep all follow-up visits as directed by your health care provider. This is important. This includes any physical therapy visits. SEEK MEDICAL CARE IF:  You experience any changes in the location or intensity of your tremors.   You start having a tremor after starting a new medicine.   You have tremor with other symptoms such as:   Numbness.   Tingling.   Pain.   Weakness.   Your tremor gets worse.   Your tremor interferes with your daily life.    This information is not intended to replace advice given to you by your health care provider. Make sure you discuss any questions you have with your health care provider.   Document Released: 03/26/2014 Document Reviewed: 03/26/2014 Elsevier Interactive Patient Education 2016 [employer]  

## 2015-12-09 NOTE — Progress Notes (Signed)
Reason for visit: Tremor  Referring physician: Dr. Lonia Chimera is a 70 y.o. male  History of present illness:  Walter [name] is a 70 year old left-handed white male with a history of diabetes and a tremor that developed about 6 months ago. The tremor has been notable mainly in the left hand, but the patient is left-handed. He reports that he is having difficulty with handwriting and difficulty holding a fork when feeding himself. He has not noted any tremor involving the jaw or head or neck or with the voice. The patient denies any tremors with the lower extremities. His father died when he was very young, he does not know whether his father had a tremor, there is no history of tremor on the mother's side of the family. The patient may note an increase in the tremor when he is upset or nervous about something. The patient has some slight imbalance issues while going up and down stairs. He denies any weakness of the arms or legs. He denies any problems controlling the bowels or the bladder. He is sent to this office for evaluation of the tremor at this time.  Past Medical History:  Diagnosis Date  . AICD (automatic cardioverter/defibrillator) present   . Asbestosis ([place])    mild  . Atrial fibrillation ([place])   . Chronic left hip pain   . Chronic low back pain    /sciatica- Dr Letta Median [facility])  . Complication of anesthesia 1996   previous surgery had a run of v-tach  . Coronary artery disease, non-occlusive June 2011   Cardiac cath in [facility] following abnormal Myoview  . DJD (degenerative joint disease)    /osteoarthritis  . Dyslipidemia, goal LDL below 100    On lovastatin (myalgias with Zocor and Lipitor)  . Essential hypertension   . Factor V Leiden ([place])   . Frozen shoulder    right  . GERD (gastroesophageal reflux disease)   . Hypogonadism male   . Irritable bowel   . Kidney stone   . Left bundle branch block (LBBB) on electrocardiogram    Diagnosed close to 20 years ago  . Myocardial infarction ([place])   . Non-ischemic cardiomyopathy [facility]) June 2011   EF 45-50%; suspect related to LBBB; repeat Echo March 2015: EF 50- 55%  . Obesity   . Restless leg syndrome   . Sleep apnea    does not use cpap  . Testicular cancer ([place]) 1972   left  . Type 2 diabetes mellitus without complication ([place]) [id number]   Type 2.     Past Surgical History:  Procedure Laterality Date  . APPENDECTOMY N/A   . BI-VENTRICULAR IMPLANTABLE CARDIOVERTER DEFIBRILLATOR N/A 02/04/2014   SJM Quadra Assura BiV ICD implanted for primary prevention by [name]  . CARDIAC CATHETERIZATION  June 2011   Nonobstructive CAD  . CARDIAC DEFIBRILLATOR PLACEMENT  02/04/14   [facility] model [place] 3365-40Q (serial Number [id number]) biventricular ICD.  Marland Kitchen CARDIOVERSION N/A 10/13/2013   Procedure: CARDIOVERSION;  Surgeon: Larey Dresser, MD;  Location: [place];  Service: Cardiovascular;  Laterality: N/A;  . CARDIOVERSION  10-16-2013   DCCV by [name] following intracardiac echo to rule out LAA thrombus  . CARDIOVERSION N/A 10/16/2013   Procedure: CARDIOVERSION;  Surgeon: Coralyn Mark, MD;  Location: [facility] CATH LAB;  Service: Cardiovascular;  Laterality: N/A;  . CHOLECYSTECTOMY OPEN    . ELECTROPHYSIOLOGY STUDY N/A 10/16/2013   Procedure: ELECTROPHYSIOLOGY STUDY;  Surgeon: Coralyn Mark, MD;  Location: [facility] CATH LAB;  Service: Cardiovascular;  Laterality: N/A;  . ESOPHAGOGASTRODUODENOSCOPY N/A 10/15/2013   Procedure: ESOPHAGOGASTRODUODENOSCOPY (EGD);  Surgeon: Gatha Mayer, MD;  Location: [facility] ENDOSCOPY;  Service: Endoscopy;  Laterality: N/A;  bedside  . HERNIA REPAIR    . KNEE ARTHROSCOPY Left   . LEFT AND RIGHT HEART CATHETERIZATION WITH CORONARY ANGIOGRAM N/A 10/12/2013   Procedure: LEFT AND RIGHT HEART CATHETERIZATION WITH CORONARY ANGIOGRAM;  Surgeon: Troy Sine, MD;  Location: [facility] CATH LAB;  Service: Cardiovascular;  Laterality: N/A;  . lymph nodes      . ROTATOR CUFF REPAIR Right   . SURGERY SCROTAL / TESTICULAR Left   . TONSILLECTOMY Bilateral   . TOTAL HIP ARTHROPLASTY Right 02/22/2015   Procedure: RIGHT TOTAL HIP ARTHROPLASTY ANTERIOR APPROACH;  Surgeon: Renette Butters, MD;  Location: [place];  Service: Orthopedics;  Laterality: Right;  . TRANSTHORACIC ECHOCARDIOGRAM  May 2011   EF 45% with diffuse hypokinesis; septal bounce from LBBB  . TRANSTHORACIC ECHOCARDIOGRAM  March 2015   EF 50-55%. Mild concentric LVH. Septal dyssynergy from LBBB     Family History  Problem Relation Age of Onset  . Hypertension Mother   . Heart disease Mother   . Heart disease Father   . Hypertension Brother   . Heart attack Maternal Uncle   . Hypertension Brother   . Heart disease Paternal Uncle     Social history:  reports that he quit smoking about 42 years ago. His smoking use included Cigarettes. He has never used smokeless tobacco. He reports that he drinks alcohol. He reports that he does not use drugs.  Medications:  Prior to Admission medications   Medication Sig Start Date End Date Taking? Authorizing Provider  amiodarone (PACERONE) 200 MG tablet Take 1 tablet (200 mg total) by mouth daily. 02/14/15  Yes Larey Dresser, MD  apixaban (ELIQUIS) 5 MG TABS tablet Take 1 tablet (5 mg total) by mouth 2 (two) times daily. 02/14/15  Yes Larey Dresser, MD  carvedilol (COREG) 12.5 MG tablet Take 1 tablet (12.5 mg total) by mouth 2 (two) times daily. 02/14/15  Yes Larey Dresser, MD  cholecalciferol (VITAMIN D) 1000 UNITS tablet Take 1,000 Units by mouth every morning.    Yes Historical Provider, MD  diclofenac (FLECTOR) 1.3 % PTCH Place 1 patch onto the skin 2 (two) times daily as needed (pain).    Yes Historical Provider, MD  esomeprazole (NEXIUM) 40 MG capsule Take 40 mg by mouth daily at 12 noon.   Yes Historical Provider, MD  furosemide (LASIX) 40 MG tablet Take 1 tablet (40 mg total) by mouth every other day. 08/26/15  Yes Larey Dresser, MD   gabapentin (NEURONTIN) 300 MG capsule Take 600 mg by mouth every evening.    Yes Historical Provider, MD  lisinopril (PRINIVIL,ZESTRIL) 10 MG tablet Take 1 tablet (10 mg total) by mouth every evening. 02/14/15  Yes Larey Dresser, MD  lovastatin (ALTOPREV) 40 MG 24 hr tablet Take 40 mg by mouth at bedtime.   Yes Historical Provider, MD  magnesium oxide (MAG-OX) 400 MG tablet Take 400 mg by mouth every morning.    Yes Historical Provider, MD  metFORMIN (GLUCOPHAGE) 1000 MG tablet Take 1,000 mg by mouth 2 (two) times daily with a meal.   Yes Historical Provider, MD  omega-3 acid ethyl esters (LOVAZA) 1 G capsule Take 1 g by mouth 2 (two) times daily.   Yes Historical  Provider, MD  rOPINIRole (REQUIP) 0.5 MG tablet Take 0.5 mg by mouth daily as needed (RLS).    Yes Historical Provider, MD  sildenafil (VIAGRA) 100 MG tablet Take 100 mg by mouth daily as needed for erectile dysfunction.   Yes Historical Provider, MD  spironolactone (ALDACTONE) 25 MG tablet Take 25 mg by mouth daily.   Yes Historical Provider, MD  testosterone cypionate (DEPO-TESTOSTERONE) 200 MG/ML injection Inject 200 mg into the muscle every Thursday.    Yes Historical Provider, MD  tiZANidine (ZANAFLEX) 4 MG capsule Take 4 mg by mouth 2 (two) times daily as needed for muscle spasms.    Yes Historical Provider, MD      Allergies  Allergen Reactions  . Statins Other (See Comments)    Myalgias.  Can only take Altoprev     ROS:  Out of a complete 14 system review of symptoms, the patient complains only of the following symptoms, and all other reviewed systems are negative.  Ringing in the ears Birthmarks Shortness of breath Diarrhea Impotence Joint pain Runny nose Insomnia, restless legs  Blood pressure (!) 164/93, pulse 68, height 6' (1.829 m), weight 251 lb 8 oz (114.1 kg).  Physical Exam  General: The patient is alert and cooperative at the time of the examination.  Eyes: Pupils are equal, round, and reactive  to light. Discs are flat bilaterally.  Neck: The neck is supple, no carotid bruits are noted.  Respiratory: The respiratory examination is clear.  Cardiovascular: The cardiovascular examination reveals a regular rate and rhythm, no obvious murmurs or rubs are noted.  Skin: Extremities are without significant edema.  Neurologic Exam  Mental status: The patient is alert and oriented x 3 at the time of the examination. The patient has apparent normal recent and remote memory, with an apparently normal attention span and concentration ability.  Cranial nerves: Facial symmetry is present. There is good sensation of the face to pinprick and soft touch bilaterally. The strength of the facial muscles and the muscles to head turning and shoulder shrug are normal bilaterally. Speech is well enunciated, no aphasia or dysarthria is noted. Extraocular movements are full. Visual fields are full. The tongue is midline, and the patient has symmetric elevation of the soft palate. No obvious hearing deficits are noted.  Motor: The motor testing reveals 5 over 5 strength of all 4 extremities. Good symmetric motor tone is noted throughout.  Sensory: Sensory testing is intact to pinprick, soft touch, vibration sensation, and position sense on all 4 extremities. No evidence of extinction is noted.  Coordination: Cerebellar testing reveals good finger-nose-finger and heel-to-shin bilaterally. The patient does have an intention tremor with both upper extremities, the tremors are symmetric. The tremor is translated into handwriting slightly.  Gait and station: Gait is normal. Tandem gait is minimally unsteady. Romberg is negative. No drift is seen.  Reflexes: Deep tendon reflexes are symmetric, but are slightly decreased bilaterally. Toes are downgoing bilaterally.   Assessment/Plan:  1. Essential tremor  The patient has a tremor that is quite consistent with an essential tremor affecting both upper  extremities. The patient does not wish to go on any medications currently for the tremor. There is no evidence of parkinsonism. The patient will call our office if he believes that the tremor is getting to the point where he needs medical therapy. The tremor will likely worsen as he gets older.  Jill Alexanders MD 12/09/2015 9:31 AM  Walter [name], Walter [name]  Phone [phone] Fax [phone]

## 2015-12-28 ENCOUNTER — Ambulatory Visit (INDEPENDENT_AMBULATORY_CARE_PROVIDER_SITE_OTHER): Payer: Medicare Other

## 2015-12-28 DIAGNOSIS — I5022 Chronic systolic (congestive) heart failure: Secondary | ICD-10-CM | POA: Diagnosis not present

## 2015-12-28 DIAGNOSIS — Z95810 Presence of automatic (implantable) cardiac defibrillator: Secondary | ICD-10-CM

## 2015-12-29 NOTE — Progress Notes (Signed)
EPIC Encounter for ICM Monitoring  Patient Name: Walter [name] is a 70 y.o. male Date: 12/29/2015 Primary Care Physican: London Pepper, MD Primary Cardiologist:[place]owell Electrophysiologist: Allred Dry Weight:    252 lbs Bi-V Pacing:  98%           Heart Failure questions reviewed, pt asymptomatic   Thoracic impedance normal   Recommendations: No changes.  Advised to limit salt intake to 2000 mg daily.  Encouraged to call for fluid symptoms.    Follow-up plan: ICM clinic phone appointment on 01/31/2016.  Copy of ICM check sent to device physician.   ICM trend: 12/28/2015       Rosalene Billings, RN 12/29/2015 2:43 PM

## 2016-01-11 ENCOUNTER — Encounter: Payer: Self-pay | Admitting: Nurse Practitioner

## 2016-01-18 DIAGNOSIS — G894 Chronic pain syndrome: Secondary | ICD-10-CM | POA: Diagnosis not present

## 2016-01-18 DIAGNOSIS — Z79899 Other long term (current) drug therapy: Secondary | ICD-10-CM | POA: Diagnosis not present

## 2016-01-18 DIAGNOSIS — M1990 Unspecified osteoarthritis, unspecified site: Secondary | ICD-10-CM | POA: Diagnosis not present

## 2016-01-18 DIAGNOSIS — M4697 Unspecified inflammatory spondylopathy, lumbosacral region: Secondary | ICD-10-CM | POA: Diagnosis not present

## 2016-01-18 DIAGNOSIS — M5137 Other intervertebral disc degeneration, lumbosacral region: Secondary | ICD-10-CM | POA: Diagnosis not present

## 2016-01-18 DIAGNOSIS — Z79891 Long term (current) use of opiate analgesic: Secondary | ICD-10-CM | POA: Diagnosis not present

## 2016-01-30 ENCOUNTER — Other Ambulatory Visit: Payer: Self-pay

## 2016-01-30 ENCOUNTER — Ambulatory Visit (HOSPITAL_COMMUNITY): Payer: Medicare Other | Attending: Cardiology

## 2016-01-30 DIAGNOSIS — R002 Palpitations: Secondary | ICD-10-CM | POA: Insufficient documentation

## 2016-01-30 DIAGNOSIS — I480 Paroxysmal atrial fibrillation: Secondary | ICD-10-CM | POA: Insufficient documentation

## 2016-01-30 DIAGNOSIS — I509 Heart failure, unspecified: Secondary | ICD-10-CM | POA: Diagnosis not present

## 2016-01-30 DIAGNOSIS — I447 Left bundle-branch block, unspecified: Secondary | ICD-10-CM | POA: Diagnosis not present

## 2016-01-30 DIAGNOSIS — I429 Cardiomyopathy, unspecified: Secondary | ICD-10-CM | POA: Diagnosis not present

## 2016-01-30 DIAGNOSIS — M1990 Unspecified osteoarthritis, unspecified site: Secondary | ICD-10-CM | POA: Diagnosis not present

## 2016-01-30 DIAGNOSIS — I5022 Chronic systolic (congestive) heart failure: Secondary | ICD-10-CM | POA: Diagnosis not present

## 2016-01-30 DIAGNOSIS — I519 Heart disease, unspecified: Secondary | ICD-10-CM | POA: Diagnosis not present

## 2016-01-31 ENCOUNTER — Ambulatory Visit (INDEPENDENT_AMBULATORY_CARE_PROVIDER_SITE_OTHER): Payer: Medicare Other

## 2016-01-31 DIAGNOSIS — I5022 Chronic systolic (congestive) heart failure: Secondary | ICD-10-CM | POA: Diagnosis not present

## 2016-01-31 DIAGNOSIS — Z95810 Presence of automatic (implantable) cardiac defibrillator: Secondary | ICD-10-CM

## 2016-02-01 ENCOUNTER — Telehealth: Payer: Self-pay | Admitting: Cardiology

## 2016-02-01 DIAGNOSIS — M47817 Spondylosis without myelopathy or radiculopathy, lumbosacral region: Secondary | ICD-10-CM | POA: Diagnosis not present

## 2016-02-01 NOTE — Telephone Encounter (Signed)
Confirmed remote transmission w/ pt wife.   

## 2016-02-03 ENCOUNTER — Telehealth: Payer: Self-pay

## 2016-02-03 NOTE — Progress Notes (Signed)
EPIC Encounter for ICM Monitoring  Patient Name: Walter [name] is a 70 y.o. male Date: 02/03/2016 Primary Care Physican: London Pepper, MD Primary Cardiologist:McDowell Electrophysiologist: Allred Dry Weight:unknown Bi-V Pacing: 97%                                                  Attempted ICM call and unable to reach.   Transmission reviewed.   Thoracic impedance normal since 01/31/2016.  Was abnormal suggesting fluid accumulation from 01/07/2016 to 01/31/2016.  Recommendations: NONE - unable to reach.    Follow-up plan: ICM clinic phone appointment on 05/01/2015 since patient will have an office defib check on 12/8 with Chanetta Marshall, NP and HF clinic 03/28/2016. .  Copy of ICM check sent to primary cardiologist and device physician.   ICM trend: 02/03/2016       Rosalene Billings, RN 02/03/2016 10:48 AM

## 2016-02-03 NOTE — Telephone Encounter (Signed)
Remote ICM transmission received.  Attempted patient call and no answer or answering machine.  

## 2016-02-15 DIAGNOSIS — M47817 Spondylosis without myelopathy or radiculopathy, lumbosacral region: Secondary | ICD-10-CM | POA: Diagnosis not present

## 2016-02-20 ENCOUNTER — Encounter: Payer: Medicare Other | Admitting: Nurse Practitioner

## 2016-02-22 DIAGNOSIS — M19019 Primary osteoarthritis, unspecified shoulder: Secondary | ICD-10-CM | POA: Diagnosis not present

## 2016-02-22 NOTE — Progress Notes (Signed)
Electrophysiology Office Note Date: 02/24/2016  ID:  Walter [name], DOB 1945/09/17, MRN [id number]  PCP: London Pepper, MD Primary Cardiologist: Aundra Dubin Electrophysiologist: Allred  CC: Routine ICD follow-up  Walter [name] is a 70 y.o. male seen today for [name].  He presents today for routine electrophysiology followup.  Since last being seen in our clinic, the patient reports doing very well. He has chronic LE edema and has been having more problems with symptoms of peripheral neuropathy. He denies chest pain, palpitations, dyspnea, PND, orthopnea, nausea, vomiting, dizziness, syncope,  weight gain, or early satiety.  He has not had ICD shocks.   Device History: STJ CRTD implanted 2015 for NICM, CHF History of appropriate therapy: No History of AAD therapy: Yes - amiodarone for atrial fibrillation    Past Medical History:  Diagnosis Date  . Asbestosis ([place])    mild  . Atrial fibrillation ([place])   . Chronic low back pain    /sciatica- Dr Letta Median [facility])  . Complication of anesthesia 1996   previous surgery had a run of v-tach  . Coronary artery disease, non-occlusive June 2011   Cardiac cath in [facility] following abnormal Myoview  . DJD (degenerative joint disease)    /osteoarthritis  . Dyslipidemia, goal LDL below 100    On lovastatin (myalgias with Zocor and Lipitor)  . Essential hypertension   . Factor V Leiden ([place])   . Frozen shoulder    right  . GERD (gastroesophageal reflux disease)   . Hypogonadism male   . Irritable bowel   . Kidney stone   . Left bundle branch block (LBBB) on electrocardiogram    Diagnosed close to 20 years ago  . Myocardial infarction   . Non-ischemic cardiomyopathy [facility]) June 2011   EF 45-50%; suspect related to LBBB; repeat Echo March 2015: EF 50- 55%  . Obesity   . Restless leg syndrome   . Sleep apnea    does not use cpap  . Testicular cancer ([place]) 1972   left  . Tremor, essential 12/09/2015  . Type 2  diabetes mellitus without complication ([place]) [id number]   Type 2.    Past Surgical History:  Procedure Laterality Date  . APPENDECTOMY N/A   . BI-VENTRICULAR IMPLANTABLE CARDIOVERTER DEFIBRILLATOR N/A 02/04/2014   SJM Quadra Assura BiV ICD implanted for primary prevention by [name]  . CARDIAC CATHETERIZATION  June 2011   Nonobstructive CAD  . CARDIAC DEFIBRILLATOR PLACEMENT  02/04/14   [facility] model [place] 3365-40Q (serial Number [id number]) biventricular ICD.  Marland Kitchen CARDIOVERSION N/A 10/13/2013   Procedure: CARDIOVERSION;  Surgeon: Larey Dresser, MD;  Location: [place];  Service: Cardiovascular;  Laterality: N/A;  . CARDIOVERSION  10-16-2013   DCCV by [name] following intracardiac echo to rule out LAA thrombus  . CARDIOVERSION N/A 10/16/2013   Procedure: CARDIOVERSION;  Surgeon: Coralyn Mark, MD;  Location: [facility] CATH LAB;  Service: Cardiovascular;  Laterality: N/A;  . CHOLECYSTECTOMY OPEN    . ELECTROPHYSIOLOGY STUDY N/A 10/16/2013   Procedure: ELECTROPHYSIOLOGY STUDY;  Surgeon: Coralyn Mark, MD;  Location: [facility] CATH LAB;  Service: Cardiovascular;  Laterality: N/A;  . ESOPHAGOGASTRODUODENOSCOPY N/A 10/15/2013   Procedure: ESOPHAGOGASTRODUODENOSCOPY (EGD);  Surgeon: Gatha Mayer, MD;  Location: [facility] ENDOSCOPY;  Service: Endoscopy;  Laterality: N/A;  bedside  . HERNIA REPAIR    . KNEE ARTHROSCOPY Left   . LEFT AND RIGHT HEART CATHETERIZATION WITH CORONARY ANGIOGRAM N/A 10/12/2013   Procedure: LEFT AND  RIGHT HEART CATHETERIZATION WITH CORONARY ANGIOGRAM;  Surgeon: Troy Sine, MD;  Location: [facility] CATH LAB;  Service: Cardiovascular;  Laterality: N/A;  . lymph nodes    . ROTATOR CUFF REPAIR Right   . SURGERY SCROTAL / TESTICULAR Left   . TONSILLECTOMY Bilateral   . TOTAL HIP ARTHROPLASTY Right 02/22/2015   Procedure: RIGHT TOTAL HIP ARTHROPLASTY ANTERIOR APPROACH;  Surgeon: Renette Butters, MD;  Location: [place];  Service: Orthopedics;  Laterality: Right;  . TRANSTHORACIC  ECHOCARDIOGRAM  May 2011   EF 45% with diffuse hypokinesis; septal bounce from LBBB  . TRANSTHORACIC ECHOCARDIOGRAM  March 2015   EF 50-55%. Mild concentric LVH. Septal dyssynergy from LBBB     Current Outpatient Prescriptions  Medication Sig Dispense Refill  . amiodarone (PACERONE) 200 MG tablet Take 0.5 tablets (100 mg total) by mouth daily. 90 tablet 3  . apixaban (ELIQUIS) 5 MG TABS tablet Take 1 tablet (5 mg total) by mouth 2 (two) times daily. 180 tablet 3  . carvedilol (COREG) 12.5 MG tablet Take 1 tablet (12.5 mg total) by mouth 2 (two) times daily. 180 tablet 3  . cholecalciferol (VITAMIN D) 1000 UNITS tablet Take 1,000 Units by mouth every morning.     . diclofenac (FLECTOR) 1.3 % PTCH Place 1 patch onto the skin 2 (two) times daily as needed (pain).     Marland Kitchen esomeprazole (NEXIUM) 40 MG capsule Take 40 mg by mouth daily at 12 noon.    . furosemide (LASIX) 40 MG tablet Take 1 tablet (40 mg total) by mouth every other day. 45 tablet 3  . gabapentin (NEURONTIN) 300 MG capsule Take 600 mg by mouth every evening.     Marland Kitchen lisinopril (PRINIVIL,ZESTRIL) 10 MG tablet Take 1 tablet (10 mg total) by mouth every evening. 90 tablet 3  . lovastatin (ALTOPREV) 40 MG 24 hr tablet Take 40 mg by mouth at bedtime.    . magnesium oxide (MAG-OX) 400 MG tablet Take 400 mg by mouth every morning.     . metFORMIN (GLUCOPHAGE) 1000 MG tablet Take 1,000 mg by mouth 2 (two) times daily with a meal.    . omega-3 acid ethyl esters (LOVAZA) 1 G capsule Take 1 g by mouth 2 (two) times daily.    Marland Kitchen oxyCODONE-acetaminophen (PERCOCET) 10-325 MG tablet     . rOPINIRole (REQUIP) 0.5 MG tablet Take 0.5 mg by mouth daily as needed (RLS).     Marland Kitchen rOPINIRole (REQUIP) 1 MG tablet     . sildenafil (VIAGRA) 100 MG tablet Take 100 mg by mouth daily as needed for erectile dysfunction.    Marland Kitchen spironolactone (ALDACTONE) 25 MG tablet Take 25 mg by mouth daily.    Marland Kitchen testosterone cypionate (DEPO-TESTOSTERONE) 200 MG/ML injection Inject 200  mg into the muscle every Thursday.     Marland Kitchen tiZANidine (ZANAFLEX) 4 MG capsule Take 4 mg by mouth 2 (two) times daily as needed for muscle spasms.      No current facility-administered medications for this visit.     Allergies:   Statins   Social History: Social History   Social History  . Marital status: Married    Spouse name: N/A  . Number of children: 2  . Years of education: 14   Occupational History  . Retired    Social History Main Topics  . Smoking status: Former Smoker    Types: Cigarettes    Quit date: 02/10/1973  . Smokeless tobacco: Never Used  . Alcohol use Yes  Comment: rare  . Drug use: No  . Sexual activity: Not on file     Comment: Married   Other Topics Concern  . Not on file   Social History Narrative   Lives at home w/ his wife, daughter and granddaughter   Left-handed   Caffeine: 1-2 diet sodas per day    Family History: Family History  Problem Relation Age of Onset  . Hypertension Mother   . Heart disease Mother   . Heart disease Father   . Hypertension Brother   . Heart attack Maternal Uncle   . Hypertension Brother   . Heart disease Paternal Uncle     Review of Systems: All other systems reviewed and are otherwise negative except as noted above.   Physical Exam: VS:  BP 130/90   Pulse 69   Ht 6' (1.829 m)   Wt 258 lb 9.6 oz (117.3 kg)   BMI 35.07 kg/m  , BMI Body mass index is 35.07 kg/m.  GEN- The patient is well appearing, alert and oriented x 3 today.   HEENT: normocephalic, atraumatic; sclera clear, conjunctiva pink; hearing intact; oropharynx clear; neck supple  Lungs- Clear to ausculation bilaterally, normal work of breathing.  No wheezes, rales, rhonchi Heart- Regular rate and rhythm (paced) GI- soft, non-tender, non-distended, bowel sounds present Extremities- no clubbing, cyanosis, 1+BLE  MS- no significant deformity or atrophy Skin- warm and dry, no rash or lesion; ICD pocket well healed Psych- euthymic mood,  full affect Neuro- strength and sensation are intact  ICD interrogation- reviewed in detail today,  See PACEART report  EKG:  EKG is ordered today. The ekg ordered today shows sinus rhythm with ventricular pacing   Recent Labs: 04/28/2015: B Natriuretic Peptide 20.3 08/26/2015: ALT 18; BUN 27; Creatinine, Ser 1.36; Hemoglobin 15.5; Platelets 180; Potassium 4.8; Sodium 133; TSH 4.176   Wt Readings from Last 3 Encounters:  02/24/16 258 lb 9.6 oz (117.3 kg)  12/09/15 251 lb 8 oz (114.1 kg)  08/26/15 255 lb (115.7 kg)     Other studies Reviewed: Additional studies/ records that were reviewed today include: Dr Jackalyn Lombard office notes   Assessment and Plan:  1.  Chronic systolic dysfunction Echo normalized post CRT Euvolemic today Stable on an appropriate medical regimen Normal ICD function See Pace Art report No changes today Continue follow up in ICM clinic  BMET today  I have discussed SJM Fortify Assura advisary with the patient today. He understands that recommendation from SJM is to not replace the device at this time. The patient is not device dependant.  The patient has not had appropriate device therapy in the past or implanted for secondary prevention.  Vibratory alert demonstrated today.  He is actively remotely monitored and understands the importance of compliance today.    2.  Persistent atrial fibrillation Burden by device interrogation 0% He has not had atrial fibrillation in over a year.  With increased neuropathy symptoms, will decrease amiodarone to 100mg  daily today and follow AF burden  (recent LFT's, TSH stable, annual eye exams recommended) Continue Eliquis for CHADS2VASC of 3 - recent CBC stable  3.  HTN Stable No change required today   Current medicines are reviewed at length with the patient today.   The patient does not have concerns regarding his medicines.  The following changes were made today:  Decrease amiodarone to 100mg  daily   Labs/ tests  ordered today include: BMET Orders Placed This Encounter  Procedures  . Basic metabolic panel  .  Implantable device check  . EKG 12-Lead     Disposition:   Follow up with ICM clinic, [place], Dr Aundra Dubin as scheduled, [name] 1 year     Signed, Chanetta Marshall, NP 02/24/2016 8:53 AM  [facility] [address] [place] [address] [phone] (office) [phone] (fax

## 2016-02-24 ENCOUNTER — Encounter: Payer: Self-pay | Admitting: Nurse Practitioner

## 2016-02-24 ENCOUNTER — Ambulatory Visit (INDEPENDENT_AMBULATORY_CARE_PROVIDER_SITE_OTHER): Payer: Medicare Other | Admitting: Nurse Practitioner

## 2016-02-24 VITALS — BP 130/90 | HR 69 | Ht 72.0 in | Wt 258.6 lb

## 2016-02-24 DIAGNOSIS — I5022 Chronic systolic (congestive) heart failure: Secondary | ICD-10-CM

## 2016-02-24 DIAGNOSIS — I480 Paroxysmal atrial fibrillation: Secondary | ICD-10-CM | POA: Diagnosis not present

## 2016-02-24 DIAGNOSIS — I428 Other cardiomyopathies: Secondary | ICD-10-CM | POA: Diagnosis not present

## 2016-02-24 DIAGNOSIS — I519 Heart disease, unspecified: Secondary | ICD-10-CM

## 2016-02-24 LAB — BASIC METABOLIC PANEL
BUN: 31 mg/dL — ABNORMAL HIGH (ref 7–25)
CHLORIDE: 104 mmol/L (ref 98–110)
CO2: 26 mmol/L (ref 20–31)
CREATININE: 1.4 mg/dL — AB (ref 0.70–1.18)
Calcium: 8.7 mg/dL (ref 8.6–10.3)
Glucose, Bld: 128 mg/dL — ABNORMAL HIGH (ref 65–99)
POTASSIUM: 4.3 mmol/L (ref 3.5–5.3)
SODIUM: 141 mmol/L (ref 135–146)

## 2016-02-24 LAB — CUP PACEART INCLINIC DEVICE CHECK
Date Time Interrogation Session: 20171208075608
Implantable Lead Implant Date: 20151119
Implantable Lead Location: 753859
Implantable Pulse Generator Implant Date: 20151119
MDC IDC LEAD IMPLANT DT: 20151119
MDC IDC LEAD IMPLANT DT: 20151119
MDC IDC LEAD LOCATION: 753858
MDC IDC LEAD LOCATION: 753860
Pulse Gen Serial Number: 7211486

## 2016-02-24 MED ORDER — AMIODARONE HCL 200 MG PO TABS: 100.0000 mg | ORAL_TABLET | Freq: Every day | ORAL | 3 refills | Status: DC

## 2016-02-24 NOTE — Patient Instructions (Addendum)
Medication Instructions:   START TAKING AMIODARONE 100 MG ONCE A DAY   If you need a refill on your cardiac medications before your next appointment, please call your pharmacy.  Labwork: BMET TODAY    Testing/Procedures: NONE ORDERED  TODAY    Follow-Up: Your physician wants you to follow-up in: [place] will receive a reminder letter in the mail two months in advance. If you don't receive a letter, please call our office to schedule the follow-up appointment.  Remote monitoring is used to monitor your Pacemaker of ICD from home. This monitoring reduces the number of office visits required to check your device to one time per year. It allows [place] to keep an eye on the functioning of your device to ensure it is working properly. You are scheduled for a device check from home on ...05/25/2016..You may send your transmission at any time that day. If you have a wireless device, the transmission will be sent automatically. After your physician reviews your transmission, you will receive a postcard with your next transmission date.     Any Other Special Instructions Will Be Listed Below (If Applicable).

## 2016-03-21 DIAGNOSIS — M25559 Pain in unspecified hip: Secondary | ICD-10-CM | POA: Diagnosis not present

## 2016-03-21 DIAGNOSIS — Z79899 Other long term (current) drug therapy: Secondary | ICD-10-CM | POA: Diagnosis not present

## 2016-03-21 DIAGNOSIS — M47817 Spondylosis without myelopathy or radiculopathy, lumbosacral region: Secondary | ICD-10-CM | POA: Diagnosis not present

## 2016-03-21 DIAGNOSIS — Z79891 Long term (current) use of opiate analgesic: Secondary | ICD-10-CM | POA: Diagnosis not present

## 2016-03-21 DIAGNOSIS — G894 Chronic pain syndrome: Secondary | ICD-10-CM | POA: Diagnosis not present

## 2016-03-21 DIAGNOSIS — M5137 Other intervertebral disc degeneration, lumbosacral region: Secondary | ICD-10-CM | POA: Diagnosis not present

## 2016-03-22 DIAGNOSIS — I10 Essential (primary) hypertension: Secondary | ICD-10-CM | POA: Diagnosis not present

## 2016-03-22 DIAGNOSIS — E785 Hyperlipidemia, unspecified: Secondary | ICD-10-CM | POA: Diagnosis not present

## 2016-03-22 DIAGNOSIS — Z6833 Body mass index (BMI) 33.0-33.9, adult: Secondary | ICD-10-CM | POA: Diagnosis not present

## 2016-03-22 DIAGNOSIS — Z7984 Long term (current) use of oral hypoglycemic drugs: Secondary | ICD-10-CM | POA: Diagnosis not present

## 2016-03-22 DIAGNOSIS — E119 Type 2 diabetes mellitus without complications: Secondary | ICD-10-CM | POA: Diagnosis not present

## 2016-03-22 DIAGNOSIS — E291 Testicular hypofunction: Secondary | ICD-10-CM | POA: Diagnosis not present

## 2016-03-22 DIAGNOSIS — R202 Paresthesia of skin: Secondary | ICD-10-CM | POA: Diagnosis not present

## 2016-03-22 DIAGNOSIS — Z125 Encounter for screening for malignant neoplasm of prostate: Secondary | ICD-10-CM | POA: Diagnosis not present

## 2016-03-22 DIAGNOSIS — I4891 Unspecified atrial fibrillation: Secondary | ICD-10-CM | POA: Diagnosis not present

## 2016-03-28 ENCOUNTER — Encounter (HOSPITAL_COMMUNITY): Payer: Self-pay

## 2016-03-28 ENCOUNTER — Ambulatory Visit (HOSPITAL_COMMUNITY)
Admission: RE | Admit: 2016-03-28 | Discharge: 2016-03-28 | Disposition: A | Discharge status: Home / Self Care | Payer: Medicare Other | Source: Ambulatory Visit | Attending: Cardiology | Admitting: Cardiology

## 2016-03-28 VITALS — BP 104/62 | HR 72 | Wt 250.5 lb

## 2016-03-28 DIAGNOSIS — I519 Heart disease, unspecified: Secondary | ICD-10-CM | POA: Insufficient documentation

## 2016-03-28 DIAGNOSIS — E119 Type 2 diabetes mellitus without complications: Secondary | ICD-10-CM | POA: Diagnosis not present

## 2016-03-28 DIAGNOSIS — I429 Cardiomyopathy, unspecified: Secondary | ICD-10-CM | POA: Insufficient documentation

## 2016-03-28 DIAGNOSIS — I480 Paroxysmal atrial fibrillation: Secondary | ICD-10-CM | POA: Diagnosis not present

## 2016-03-28 DIAGNOSIS — I10 Essential (primary) hypertension: Secondary | ICD-10-CM | POA: Insufficient documentation

## 2016-03-28 DIAGNOSIS — K219 Gastro-esophageal reflux disease without esophagitis: Secondary | ICD-10-CM | POA: Diagnosis not present

## 2016-03-28 DIAGNOSIS — J61 Pneumoconiosis due to asbestos and other mineral fibers: Secondary | ICD-10-CM | POA: Diagnosis not present

## 2016-03-28 DIAGNOSIS — D6851 Activated protein C resistance: Secondary | ICD-10-CM | POA: Diagnosis not present

## 2016-03-28 DIAGNOSIS — D751 Secondary polycythemia: Secondary | ICD-10-CM | POA: Insufficient documentation

## 2016-03-28 DIAGNOSIS — Z87891 Personal history of nicotine dependence: Secondary | ICD-10-CM | POA: Diagnosis not present

## 2016-03-28 DIAGNOSIS — E785 Hyperlipidemia, unspecified: Secondary | ICD-10-CM | POA: Insufficient documentation

## 2016-03-28 DIAGNOSIS — I447 Left bundle-branch block, unspecified: Secondary | ICD-10-CM | POA: Diagnosis not present

## 2016-03-28 DIAGNOSIS — I2510 Atherosclerotic heart disease of native coronary artery without angina pectoris: Secondary | ICD-10-CM | POA: Diagnosis not present

## 2016-03-28 DIAGNOSIS — G2581 Restless legs syndrome: Secondary | ICD-10-CM | POA: Insufficient documentation

## 2016-03-28 DIAGNOSIS — Z7984 Long term (current) use of oral hypoglycemic drugs: Secondary | ICD-10-CM | POA: Diagnosis not present

## 2016-03-28 LAB — COMPREHENSIVE METABOLIC PANEL
ALBUMIN: 3.9 g/dL (ref 3.5–5.0)
ALK PHOS: 47 U/L (ref 38–126)
ALT: 20 U/L (ref 17–63)
AST: 21 U/L (ref 15–41)
Anion gap: 9 (ref 5–15)
BUN: 18 mg/dL (ref 6–20)
CALCIUM: 9.3 mg/dL (ref 8.9–10.3)
CHLORIDE: 104 mmol/L (ref 101–111)
CO2: 24 mmol/L (ref 22–32)
CREATININE: 1.26 mg/dL — AB (ref 0.61–1.24)
GFR calc non Af Amer: 55 mL/min — ABNORMAL LOW (ref >60)
Glucose, Bld: 111 mg/dL — ABNORMAL HIGH (ref 65–99)
Potassium: 4.8 mmol/L (ref 3.5–5.1)
SODIUM: 137 mmol/L (ref 135–145)
Total Bilirubin: 1.3 mg/dL — ABNORMAL HIGH (ref 0.3–1.2)
Total Protein: 6.2 g/dL — ABNORMAL LOW (ref 6.5–8.1)

## 2016-03-28 LAB — CBC
HEMATOCRIT: 54.6 % — AB (ref 39.0–52.0)
Hemoglobin: 18.1 g/dL — ABNORMAL HIGH (ref 13.0–17.0)
MCH: 32.4 pg (ref 26.0–34.0)
MCHC: 33.2 g/dL (ref 30.0–36.0)
MCV: 97.8 fL (ref 78.0–100.0)
Platelets: 176 10*3/uL (ref 150–400)
RBC: 5.58 MIL/uL (ref 4.22–5.81)
RDW: 14.8 % (ref 11.5–15.5)
WBC: 6.5 10*3/uL (ref 4.0–10.5)

## 2016-03-28 LAB — TSH: TSH: 4.119 u[IU]/mL (ref 0.350–4.500)

## 2016-03-28 NOTE — Progress Notes (Signed)
Patient ID: Walter [name], male   DOB: Apr 02, 1945, 71 y.o.   MRN: [id number] PCP: Dr. Orland Mustard  71 yo with history of paroxysmal atrial fibrillation, chronic LBBB and cardiomyopathy presents for cardiology evaluation.  He was found to have LBBB 15-20 years ago.  In 2011, he had a stress test in [facility] that was abnormal so he was taken for [facility] in 6/11 that showed nonobstructive disease.  He had an echo in 5/11 that showed EF 45-50%, diffuse hypokinesis suggesting a mild nonischemic cardiomyopathy.   He has admitted in 7/15 with atrial fibrillation with RVR.  Rate was very difficult to control.  TEE failed due to inability to pass probe x 2.  Eventually, ICE was done to rule out LAA thrombus and he was cardioverted to NSR.  He is on amiodarone now and in NSR.  Echo in 7/15 showed EF 20% with diffuse hypokinesis in setting of atrial fibrillation/RVR.  LHC was also done in 7/15 with minimal coronary disease.  Repeat echo in 10/15 showed EF 20-25%.  In 11/15, he had St Jude CRT-D device placed.  Repeat echo in 4/16 showed EF improved to 50% with mild LVH. Repeat echo in 11/17 showed EF 50-55%, mildly dilated RV with normal systolic function.   He is on Eliquis with no BRBPR or melena.  Weight is down 5 lbs.  No exertional dyspnea.  Plays golf fairly regularly.  No chest pain.  No orthopnea or PND. No palpitations.  He is in NSR today.  Had mild tremor that may be due to amiodarone, improved when dose was decreased to 100 mg daily.   Device interrogation today showed no atrial fibrillation or VT.    Labs (11/14): K 3.9, creatinine 1.14, LDL 104, LFTs normal Labs (2/15): HCT 56.3 Labs (9/15): K 4.1, creatinine 1.1, digoxin 0.9, TSH normal, LFTs normal Labs (10/15): TGs 262, LDL 82, BNP 68 Labs (11/15): K 4.1, creatinine 1.12 Labs (12/15): K 4.8, creatinine 1.02 Labs (3/16): K 4.6, creatinine 1.06, digoxin 0.8, LFTs normal, TSH normal Labs (11/16): K 4.2, creatinine 1.17 => 1.3, HCT 56.2, TSH normal, LDL  75, HDL 34, LFTs normal Labs (2/17): K 4.4, creatinine 1.18, BNP 20, HCT 50.8, LFTs normal Labs (12/17): K 4.3, creatinine 1.4  ECG: NSR, BiV paced  PMH: 1. Type II diabetes 2. HTN 3. Adrenal nodule 4. Restless leg syndrome 5. OA with right hip pain, right frozen shoulder, low back pain. S/p R THR in 12/16.  6. Testicular cancer: 1972 7. Chronic LBBB: Had Cardiolite in 2011 that was abnormal so had LHC in 6/11 in [place], [place].  This showed 30% LCx stenosis and 40% ostial RCA stenosis.  LHC in 7/15 in [place] showed minimal coronary disease.  8. 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  Echo (5/11) with EF 45-50%, moderate LVH.  Echo (7/15) with EF 20%, diffuse hypokinesis, mild to moderately decreased RV systolic function (in setting of afib with RVR). LHC (7/15) with minimal coronary disease.  Echo (10/15) with EF 20-25%, mild LVH, moderate LAE. St Jude CRT-D device placed in 11/15. Echo (4/16) with EF 50%, mild LVH.  - Echo (11/17): EF 50-55%, mildly dilated RV with normal systolic function.  9. Factor V Leiden + 10. Asbestosis: Mild.  Exposure while in the [employer].  11. GERD 12. PVCs 13. Polycythemia: Uncertain etiology 14. Hyperlipidemia: Myalgias with Zocor and Lipitor.  15. Atrial fibrillation: Paroxysmal.  Unable to pass TEE probe.   SH: Retired from [employer], lives with wife in [place].  2 daughters.  Occasional ETOH.  Quit smoking > 40 years ago.   FH: No premature CAD  ROS: All systems reviewed and negative except as per HPI.    Current Outpatient Prescriptions  Medication Sig Dispense Refill  . amiodarone (PACERONE) 200 MG tablet Take 0.5 tablets (100 mg total) by mouth daily. 90 tablet 3  . apixaban (ELIQUIS) 5 MG TABS tablet Take 1 tablet (5 mg total) by mouth 2 (two) times daily. 180 tablet 3  . carvedilol (COREG) 12.5 MG tablet Take 1 tablet (12.5 mg total) by mouth 2 (two) times daily.  180 tablet 3  . cholecalciferol (VITAMIN D) 1000 UNITS tablet Take 1,000 Units by mouth every morning.     . diclofenac (FLECTOR) 1.3 % PTCH Place 1 patch onto the skin 2 (two) times daily as needed (pain).     Marland Kitchen esomeprazole (NEXIUM) 40 MG capsule Take 40 mg by mouth daily at 12 noon.    . furosemide (LASIX) 40 MG tablet Take 1 tablet (40 mg total) by mouth every other day. 45 tablet 3  . gabapentin (NEURONTIN) 300 MG capsule Take 600 mg by mouth every evening.     Marland Kitchen lisinopril (PRINIVIL,ZESTRIL) 10 MG tablet Take 1 tablet (10 mg total) by mouth every evening. 90 tablet 3  . lovastatin (ALTOPREV) 40 MG 24 hr tablet Take 40 mg by mouth at bedtime.    . magnesium oxide (MAG-OX) 400 MG tablet Take 400 mg by mouth every morning.     . metFORMIN (GLUCOPHAGE) 1000 MG tablet Take 1,000 mg by mouth 2 (two) times daily with a meal.    . omega-3 acid ethyl esters (LOVAZA) 1 G capsule Take 1 g by mouth 2 (two) times daily.    Marland Kitchen oxyCODONE-acetaminophen (PERCOCET) 10-325 MG tablet     . rOPINIRole (REQUIP) 0.5 MG tablet Take 0.5 mg by mouth daily as needed (RLS).     Marland Kitchen rOPINIRole (REQUIP) 1 MG tablet     . sildenafil (VIAGRA) 100 MG tablet Take 100 mg by mouth daily as needed for erectile dysfunction.    Marland Kitchen spironolactone (ALDACTONE) 25 MG tablet Take 25 mg by mouth daily.    Marland Kitchen testosterone cypionate (DEPO-TESTOSTERONE) 200 MG/ML injection Inject 200 mg into the muscle every Thursday.     Marland Kitchen tiZANidine (ZANAFLEX) 4 MG capsule Take 4 mg by mouth 2 (two) times daily as needed for muscle spasms.      No current facility-administered medications for this encounter.    BP 104/62   Pulse 72   Wt 250 lb 8 oz (113.6 kg)   SpO2 97%   BMI 33.97 kg/m  General: NAD Neck: No JVD, no thyromegaly or thyroid nodule.  Lungs: Clear to auscultation bilaterally with normal respiratory effort. CV: Nondisplaced PMI.  Heart regular S1/S2, no S3/S4, no murmur.  Trace ankle edema. No carotid bruit.  Normal pedal pulses.   Abdomen: Soft, nontender, no hepatosplenomegaly, no distention.  Skin: Intact without lesions or rashes.  Neurologic: Alert and oriented x 3.  Psych: Normal affect. Extremities: No clubbing or cyanosis.  Venous varicosities lower legs.   Assessment/Plan: 1. Cardiomyopathy: Nonischemic.  There was a pre-existing possible LBBB cardiomyopathy, but EF was down to 20% in the setting of atrial fibrillation with RVR in 7/15 (suggesting tachycardia-mediated cardiomyopathy).  However, EF did not improve in NSR (20-25% on repeat echo in 10/15).  Now with St Jude CRT-D device.  No significant coronary disease on  7/15 LHC.   Since placement of CRT-D, EF has improved to 50-55% (11/17 echo).  NYHA class I-II symptoms.  He is euvolemic today on exam.   - Continue current Coreg, spironolactone, and lisinopril. - Continue current Lasix, check BMET today.   2. CAD: Nonobstructive, mild.  Continue statin.  No aspirin as he is anticoagulated.  3. Hyperlipidemia: Continue lovastatin.  Myalgias with other statins.   4. HTN: BP is controlled.  5. Atrial fibrillation: Paroxysmal, now in NSR on amiodarone 100 mg daily => dose decreased due to tremor which is now improved.  Needs to maintain NSR as possible component of tachy-mediated cardiomyopathy.  No atrial fibrillation noted on device interrogation today.  - Continue current Eliquis, will check CBC today.  - Given amiodarone use, will check LFTs and TSH today. Will need yearly eye exam.   Followup in 6 months.   Loralie Champagne 03/28/2016

## 2016-03-28 NOTE — Patient Instructions (Signed)
Labs today  We will contact you in 6 months to schedule your next appointment.  

## 2016-04-12 ENCOUNTER — Other Ambulatory Visit: Payer: Self-pay | Admitting: Internal Medicine

## 2016-04-12 ENCOUNTER — Encounter: Payer: Self-pay | Admitting: Internal Medicine

## 2016-04-18 DIAGNOSIS — M47816 Spondylosis without myelopathy or radiculopathy, lumbar region: Secondary | ICD-10-CM | POA: Diagnosis not present

## 2016-04-18 DIAGNOSIS — Z79891 Long term (current) use of opiate analgesic: Secondary | ICD-10-CM | POA: Diagnosis not present

## 2016-04-18 DIAGNOSIS — Z79899 Other long term (current) drug therapy: Secondary | ICD-10-CM | POA: Diagnosis not present

## 2016-04-18 DIAGNOSIS — M1990 Unspecified osteoarthritis, unspecified site: Secondary | ICD-10-CM | POA: Diagnosis not present

## 2016-04-18 DIAGNOSIS — M4697 Unspecified inflammatory spondylopathy, lumbosacral region: Secondary | ICD-10-CM | POA: Diagnosis not present

## 2016-04-18 DIAGNOSIS — G894 Chronic pain syndrome: Secondary | ICD-10-CM | POA: Diagnosis not present

## 2016-04-24 DIAGNOSIS — D751 Secondary polycythemia: Secondary | ICD-10-CM | POA: Diagnosis not present

## 2016-04-27 ENCOUNTER — Other Ambulatory Visit: Payer: Self-pay | Admitting: Cardiology

## 2016-04-27 DIAGNOSIS — I519 Heart disease, unspecified: Secondary | ICD-10-CM

## 2016-04-30 ENCOUNTER — Ambulatory Visit (INDEPENDENT_AMBULATORY_CARE_PROVIDER_SITE_OTHER): Payer: Medicare Other

## 2016-04-30 ENCOUNTER — Telehealth: Payer: Self-pay | Admitting: Cardiology

## 2016-04-30 DIAGNOSIS — I5022 Chronic systolic (congestive) heart failure: Secondary | ICD-10-CM

## 2016-04-30 DIAGNOSIS — Z95810 Presence of automatic (implantable) cardiac defibrillator: Secondary | ICD-10-CM

## 2016-04-30 NOTE — Telephone Encounter (Signed)
LMOVM reminding pt to send remote transmission.   

## 2016-05-01 NOTE — Progress Notes (Signed)
EPIC Encounter for ICM Monitoring  Patient Name: Walter [name] is a 71 y.o. male Date: 05/01/2016 Primary Care Physican: London Pepper, MD Primary Cardiologist:McDowell Electrophysiologist: Allred Dry Weight:unknown Bi-V Pacing: 90%                                                  Attempted call to patient and unable to reach.  Left detailed message regarding transmission.  Transmission reviewed.    Thoracic impedance normal   Recommendations: Left voice mail with ICM number and encouraged to call for fluid symptoms.  Follow-up plan: ICM clinic phone appointment on 06/01/2016.  Copy of ICM check sent to device physician.   3 month ICM trend: 05/01/2016   1 Year ICM trend:     Rosalene Billings, RN 05/01/2016 9:02 AM

## 2016-05-03 ENCOUNTER — Telehealth: Payer: Self-pay

## 2016-05-03 NOTE — Telephone Encounter (Signed)
Remote ICM transmission received.  Attempted patient call and left detailed message regarding transmission and next ICM scheduled for 06/01/2016.  Advised to return call for any fluid symptoms or questions.

## 2016-05-14 DIAGNOSIS — E291 Testicular hypofunction: Secondary | ICD-10-CM | POA: Diagnosis not present

## 2016-05-14 DIAGNOSIS — D582 Other hemoglobinopathies: Secondary | ICD-10-CM | POA: Diagnosis not present

## 2016-05-16 DIAGNOSIS — Z79891 Long term (current) use of opiate analgesic: Secondary | ICD-10-CM | POA: Diagnosis not present

## 2016-05-16 DIAGNOSIS — M4697 Unspecified inflammatory spondylopathy, lumbosacral region: Secondary | ICD-10-CM | POA: Diagnosis not present

## 2016-05-16 DIAGNOSIS — Z79899 Other long term (current) drug therapy: Secondary | ICD-10-CM | POA: Diagnosis not present

## 2016-05-16 DIAGNOSIS — M47816 Spondylosis without myelopathy or radiculopathy, lumbar region: Secondary | ICD-10-CM | POA: Diagnosis not present

## 2016-05-16 DIAGNOSIS — M25519 Pain in unspecified shoulder: Secondary | ICD-10-CM | POA: Diagnosis not present

## 2016-05-16 DIAGNOSIS — G894 Chronic pain syndrome: Secondary | ICD-10-CM | POA: Diagnosis not present

## 2016-06-01 ENCOUNTER — Telehealth: Payer: Self-pay | Admitting: Cardiology

## 2016-06-01 ENCOUNTER — Ambulatory Visit (INDEPENDENT_AMBULATORY_CARE_PROVIDER_SITE_OTHER): Payer: Medicare Other

## 2016-06-01 DIAGNOSIS — Z95810 Presence of automatic (implantable) cardiac defibrillator: Secondary | ICD-10-CM | POA: Diagnosis not present

## 2016-06-01 DIAGNOSIS — I5022 Chronic systolic (congestive) heart failure: Secondary | ICD-10-CM

## 2016-06-01 NOTE — Progress Notes (Signed)
EPIC Encounter for ICM Monitoring  Patient Name: Walter [name] is a 71 y.o. male Date: 06/01/2016 Primary Care Physican: London Pepper, MD Primary Cardiologist:McDowell Electrophysiologist: Allred Dry Weight:unknown Bi-V Pacing: 89%       Transmission reviewed   Thoracic impedance normal but was abnormal suggesting fluid accumulation x 15 days from 05/09/2016 to 05/24/2016.  Prescribed dosage: Furosemide 40 mg every other day.  Recommendations: None   Follow-up plan: ICM clinic phone appointment on 07/05/2016.  Copy of ICM check sent to device physician.   3 month ICM trend: 06/01/2016   1 Year ICM trend:      Rosalene Billings, RN 06/01/2016 11:09 AM

## 2016-06-01 NOTE — Telephone Encounter (Signed)
Confirmed remote transmission w/ pt.   

## 2016-06-11 DIAGNOSIS — G894 Chronic pain syndrome: Secondary | ICD-10-CM | POA: Diagnosis not present

## 2016-06-11 DIAGNOSIS — M545 Low back pain: Secondary | ICD-10-CM | POA: Diagnosis not present

## 2016-06-11 DIAGNOSIS — M19019 Primary osteoarthritis, unspecified shoulder: Secondary | ICD-10-CM | POA: Diagnosis not present

## 2016-06-11 DIAGNOSIS — Z79891 Long term (current) use of opiate analgesic: Secondary | ICD-10-CM | POA: Diagnosis not present

## 2016-06-11 DIAGNOSIS — M47817 Spondylosis without myelopathy or radiculopathy, lumbosacral region: Secondary | ICD-10-CM | POA: Diagnosis not present

## 2016-06-11 DIAGNOSIS — Z79899 Other long term (current) drug therapy: Secondary | ICD-10-CM | POA: Diagnosis not present

## 2016-07-05 ENCOUNTER — Telehealth: Payer: Self-pay | Admitting: Cardiology

## 2016-07-05 ENCOUNTER — Ambulatory Visit (INDEPENDENT_AMBULATORY_CARE_PROVIDER_SITE_OTHER): Payer: Medicare Other

## 2016-07-05 DIAGNOSIS — Z95810 Presence of automatic (implantable) cardiac defibrillator: Secondary | ICD-10-CM | POA: Diagnosis not present

## 2016-07-05 DIAGNOSIS — I5022 Chronic systolic (congestive) heart failure: Secondary | ICD-10-CM

## 2016-07-05 NOTE — Progress Notes (Signed)
EPIC Encounter for ICM Monitoring  Patient Name: Hillis Mcphatter is a 71 y.o. male Date: 07/05/2016 Primary Care Physican: London Pepper, MD Primary Cardiologist:McDowell Electrophysiologist: Allred Dry Weight:unknown Bi-V Pacing: 88%      Heart Failure questions reviewed, pt asymptomatic.   Thoracic impedance normal.  Prescribed dosage: Furosemide 40 mg every other day.  Recommendations: No changes. Patient has had trouble with monitor and has to place the monitor on his device in order for it to send transmission.  He called [employer] tech support and is getting a new monitor.  Advised to send a remote transmission with new monitor and to call me when he sends to I can make sure it was received.   Encouraged to call for fluid symptoms.  Follow-up plan: ICM clinic phone appointment on 08/07/2016.   Copy of ICM check sent to primary cardiologist and device physician.   3 month ICM trend: 07/05/2016   1 Year ICM trend:      Rosalene Billings, RN 07/05/2016 3:48 PM

## 2016-07-05 NOTE — Telephone Encounter (Signed)
Spoke with pt and reminded pt of remote transmission that is due today. Pt verbalized understanding.   

## 2016-07-11 DIAGNOSIS — M1990 Unspecified osteoarthritis, unspecified site: Secondary | ICD-10-CM | POA: Diagnosis not present

## 2016-07-11 DIAGNOSIS — M4697 Unspecified inflammatory spondylopathy, lumbosacral region: Secondary | ICD-10-CM | POA: Diagnosis not present

## 2016-07-11 DIAGNOSIS — Z79899 Other long term (current) drug therapy: Secondary | ICD-10-CM | POA: Diagnosis not present

## 2016-07-11 DIAGNOSIS — G894 Chronic pain syndrome: Secondary | ICD-10-CM | POA: Diagnosis not present

## 2016-07-11 DIAGNOSIS — M25519 Pain in unspecified shoulder: Secondary | ICD-10-CM | POA: Diagnosis not present

## 2016-07-11 DIAGNOSIS — M19019 Primary osteoarthritis, unspecified shoulder: Secondary | ICD-10-CM | POA: Diagnosis not present

## 2016-07-11 DIAGNOSIS — Z79891 Long term (current) use of opiate analgesic: Secondary | ICD-10-CM | POA: Diagnosis not present

## 2016-08-03 DIAGNOSIS — Z0000 Encounter for general adult medical examination without abnormal findings: Secondary | ICD-10-CM | POA: Diagnosis not present

## 2016-08-03 DIAGNOSIS — E291 Testicular hypofunction: Secondary | ICD-10-CM | POA: Diagnosis not present

## 2016-08-03 DIAGNOSIS — Z7984 Long term (current) use of oral hypoglycemic drugs: Secondary | ICD-10-CM | POA: Diagnosis not present

## 2016-08-03 DIAGNOSIS — N400 Enlarged prostate without lower urinary tract symptoms: Secondary | ICD-10-CM | POA: Diagnosis not present

## 2016-08-03 DIAGNOSIS — G2581 Restless legs syndrome: Secondary | ICD-10-CM | POA: Diagnosis not present

## 2016-08-03 DIAGNOSIS — E785 Hyperlipidemia, unspecified: Secondary | ICD-10-CM | POA: Diagnosis not present

## 2016-08-03 DIAGNOSIS — I10 Essential (primary) hypertension: Secondary | ICD-10-CM | POA: Diagnosis not present

## 2016-08-03 DIAGNOSIS — I4891 Unspecified atrial fibrillation: Secondary | ICD-10-CM | POA: Diagnosis not present

## 2016-08-03 DIAGNOSIS — E119 Type 2 diabetes mellitus without complications: Secondary | ICD-10-CM | POA: Diagnosis not present

## 2016-08-03 DIAGNOSIS — N529 Male erectile dysfunction, unspecified: Secondary | ICD-10-CM | POA: Diagnosis not present

## 2016-08-07 ENCOUNTER — Ambulatory Visit (INDEPENDENT_AMBULATORY_CARE_PROVIDER_SITE_OTHER): Payer: Medicare Other

## 2016-08-07 DIAGNOSIS — I5022 Chronic systolic (congestive) heart failure: Secondary | ICD-10-CM

## 2016-08-07 DIAGNOSIS — Z95810 Presence of automatic (implantable) cardiac defibrillator: Secondary | ICD-10-CM | POA: Diagnosis not present

## 2016-08-08 DIAGNOSIS — G894 Chronic pain syndrome: Secondary | ICD-10-CM | POA: Diagnosis not present

## 2016-08-08 DIAGNOSIS — M25519 Pain in unspecified shoulder: Secondary | ICD-10-CM | POA: Diagnosis not present

## 2016-08-08 DIAGNOSIS — M1990 Unspecified osteoarthritis, unspecified site: Secondary | ICD-10-CM | POA: Diagnosis not present

## 2016-08-08 DIAGNOSIS — M47816 Spondylosis without myelopathy or radiculopathy, lumbar region: Secondary | ICD-10-CM | POA: Diagnosis not present

## 2016-08-09 NOTE — Progress Notes (Signed)
EPIC Encounter for ICM Monitoring  Patient Name: Walter [name] is a 71 y.o. male Date: 08/09/2016 Primary Care Physican: London Pepper, MD Primary Cardiologist:McLean Electrophysiologist: Allred Dry Weight:unknown Bi-V Pacing: 89%      Heart Failure questions reviewed, pt symptomatic with some ankle swelling.   Thoracic impedance abnormal suggesting fluid accumulation starting 08/02/2016.   Impedance shows above baseline from 07/14/2016 to 08/01/2016 suggesting dryness.  Prescribed dosage: Furosemide 20 mg every day.  Spironolactone 25 mg 1 tablet daily.  Labs: 03/28/2016 Creatinine 1.26, BUN 18, Potassium 4.8, Sodium 137 02/24/2016 Creatinine 1.40, BUN 31, Potassium 4.3, Sodium 141  08/26/2015 Creatinine 1.36, BUN 27, Potassium 4.8, Sodium 133  04/28/2015 Creatinine 1.18, BUN 21, Potassium 4.4, Sodium 138   Recommendations:   Patient has not taken any Furosemide for past 3 weeks. He is taking it only when he needs it but will start taking it daily until fluid is gone.     Follow-up plan: ICM clinic phone appointment on 08/17/2016 to recheck fluid levels.    Copy of ICM check sent to Dr Aundra Dubin and [name] for review and will call back if any recommendations.   3 month ICM trend: 08/09/2016   1 Year ICM trend:      Rosalene Billings, RN 08/09/2016 11:40 AM

## 2016-08-21 DIAGNOSIS — E119 Type 2 diabetes mellitus without complications: Secondary | ICD-10-CM | POA: Diagnosis not present

## 2016-08-21 DIAGNOSIS — H2513 Age-related nuclear cataract, bilateral: Secondary | ICD-10-CM | POA: Diagnosis not present

## 2016-09-04 DIAGNOSIS — G894 Chronic pain syndrome: Secondary | ICD-10-CM | POA: Diagnosis not present

## 2016-09-04 DIAGNOSIS — Z79891 Long term (current) use of opiate analgesic: Secondary | ICD-10-CM | POA: Diagnosis not present

## 2016-09-04 DIAGNOSIS — M549 Dorsalgia, unspecified: Secondary | ICD-10-CM | POA: Diagnosis not present

## 2016-09-04 DIAGNOSIS — Z79899 Other long term (current) drug therapy: Secondary | ICD-10-CM | POA: Diagnosis not present

## 2016-09-04 DIAGNOSIS — M47817 Spondylosis without myelopathy or radiculopathy, lumbosacral region: Secondary | ICD-10-CM | POA: Diagnosis not present

## 2016-09-04 DIAGNOSIS — M19019 Primary osteoarthritis, unspecified shoulder: Secondary | ICD-10-CM | POA: Diagnosis not present

## 2016-09-11 ENCOUNTER — Ambulatory Visit (INDEPENDENT_AMBULATORY_CARE_PROVIDER_SITE_OTHER): Payer: Medicare Other | Admitting: *Deleted

## 2016-09-11 ENCOUNTER — Telehealth: Payer: Self-pay | Admitting: Cardiology

## 2016-09-11 DIAGNOSIS — I5022 Chronic systolic (congestive) heart failure: Secondary | ICD-10-CM

## 2016-09-11 DIAGNOSIS — I428 Other cardiomyopathies: Secondary | ICD-10-CM

## 2016-09-11 DIAGNOSIS — Z95810 Presence of automatic (implantable) cardiac defibrillator: Secondary | ICD-10-CM

## 2016-09-11 NOTE — Telephone Encounter (Signed)
LMOVM reminding pt to send remote transmission.   

## 2016-09-12 ENCOUNTER — Encounter: Payer: Self-pay | Admitting: Cardiology

## 2016-09-12 NOTE — Progress Notes (Signed)
Remote ICD transmission.   

## 2016-09-13 LAB — CUP PACEART REMOTE DEVICE CHECK
Battery Remaining Longevity: 53 mo
Battery Remaining Percentage: 63 %
Brady Statistic AS VP Percent: 76 %
Brady Statistic AS VS Percent: 5.2 %
Brady Statistic RA Percent Paced: 10 %
Date Time Interrogation Session: 20180627073643
HIGH POWER IMPEDANCE MEASURED VALUE: 73 Ohm
HighPow Impedance: 73 Ohm
Implantable Lead Implant Date: 20151119
Implantable Lead Implant Date: 20151119
Implantable Lead Location: 753858
Implantable Lead Location: 753860
Implantable Pulse Generator Implant Date: 20151119
Lead Channel Impedance Value: 390 Ohm
Lead Channel Impedance Value: 530 Ohm
Lead Channel Pacing Threshold Amplitude: 0.75 V
Lead Channel Pacing Threshold Amplitude: 0.75 V
Lead Channel Pacing Threshold Pulse Width: 0.5 ms
Lead Channel Pacing Threshold Pulse Width: 0.5 ms
Lead Channel Pacing Threshold Pulse Width: 0.5 ms
Lead Channel Sensing Intrinsic Amplitude: 12 mV
Lead Channel Setting Pacing Amplitude: 1.5 V
Lead Channel Setting Sensing Sensitivity: 0.5 mV
MDC IDC LEAD IMPLANT DT: 20151119
MDC IDC LEAD LOCATION: 753859
MDC IDC MSMT BATTERY VOLTAGE: 2.95 V
MDC IDC MSMT LEADCHNL RA IMPEDANCE VALUE: 440 Ohm
MDC IDC MSMT LEADCHNL RA PACING THRESHOLD AMPLITUDE: 0.75 V
MDC IDC MSMT LEADCHNL RA SENSING INTR AMPL: 2.5 mV
MDC IDC SET LEADCHNL LV PACING PULSEWIDTH: 0.5 ms
MDC IDC SET LEADCHNL RA PACING AMPLITUDE: 2 V
MDC IDC SET LEADCHNL RV PACING AMPLITUDE: 2.5 V
MDC IDC SET LEADCHNL RV PACING PULSEWIDTH: 0.5 ms
MDC IDC STAT BRADY AP VP PERCENT: 14 %
MDC IDC STAT BRADY AP VS PERCENT: 1 %
Pulse Gen Serial Number: 7211486

## 2016-09-18 DIAGNOSIS — M47817 Spondylosis without myelopathy or radiculopathy, lumbosacral region: Secondary | ICD-10-CM | POA: Diagnosis not present

## 2016-09-20 ENCOUNTER — Telehealth: Payer: Self-pay

## 2016-09-20 NOTE — Progress Notes (Signed)
EPIC Encounter for ICM Monitoring  Patient Name: Walter [name] is a 71 y.o. male Date: 09/20/2016 Primary Care Physican: London Pepper, MD Primary Cardiologist:McLean Electrophysiologist: Allred Dry Weight:unknown Bi-V Pacing: 89%      Attempted call to patient and unable to reach.  Left detailed message regarding transmission.  Transmission reviewed.    Thoracic impedance normal  Prescribed dosage: Furosemide 20 mg every day.  Spironolactone 25 mg 1 tablet daily.  Labs: 03/28/2016 Creatinine 1.26, BUN 18, Potassium 4.8, Sodium 137 02/24/2016 Creatinine 1.40, BUN 31, Potassium 4.3, Sodium 141  08/26/2015 Creatinine 1.36, BUN 27, Potassium 4.8, Sodium 133  04/28/2015 Creatinine 1.18, BUN 21, Potassium 4.4, Sodium 138   Recommendations: Left voice mail with ICM number and encouraged to call for fluid symptoms.  Follow-up plan: ICM clinic phone appointment on 10/23/2016.    Copy of ICM check sent to device physician.   3 month ICM trend: 09/12/2016   1 Year ICM trend:      Rosalene Billings, RN 09/20/2016 4:44 PM

## 2016-09-20 NOTE — Telephone Encounter (Signed)
Remote ICM transmission received.  Attempted patient call and left detailed message regarding transmission and next ICM scheduled for 10/23/2016.  Advised to return call for any fluid symptoms or questions.    

## 2016-10-08 DIAGNOSIS — G894 Chronic pain syndrome: Secondary | ICD-10-CM | POA: Diagnosis not present

## 2016-10-08 DIAGNOSIS — M4697 Unspecified inflammatory spondylopathy, lumbosacral region: Secondary | ICD-10-CM | POA: Diagnosis not present

## 2016-10-08 DIAGNOSIS — Z79899 Other long term (current) drug therapy: Secondary | ICD-10-CM | POA: Diagnosis not present

## 2016-10-08 DIAGNOSIS — Z79891 Long term (current) use of opiate analgesic: Secondary | ICD-10-CM | POA: Diagnosis not present

## 2016-10-08 DIAGNOSIS — M25519 Pain in unspecified shoulder: Secondary | ICD-10-CM | POA: Diagnosis not present

## 2016-10-08 DIAGNOSIS — M47816 Spondylosis without myelopathy or radiculopathy, lumbar region: Secondary | ICD-10-CM | POA: Diagnosis not present

## 2016-10-23 ENCOUNTER — Ambulatory Visit (INDEPENDENT_AMBULATORY_CARE_PROVIDER_SITE_OTHER): Payer: Medicare Other

## 2016-10-23 ENCOUNTER — Telehealth: Payer: Self-pay

## 2016-10-23 DIAGNOSIS — I5022 Chronic systolic (congestive) heart failure: Secondary | ICD-10-CM

## 2016-10-23 DIAGNOSIS — Z95810 Presence of automatic (implantable) cardiac defibrillator: Secondary | ICD-10-CM | POA: Diagnosis not present

## 2016-10-23 NOTE — Progress Notes (Signed)
EPIC Encounter for ICM Monitoring  Patient Name: Walter [name] is a 71 y.o. male Date: 10/23/2016 Primary Care Physican: London Pepper, MD Primary Cardiologist:McLean Electrophysiologist: Allred Dry Weight:unknown Bi-V Pacing:  92%        Attempted call to patient and unable to reach.  Left detailed message regarding transmission.  Transmission reviewed.    Thoracic impedance above baseline since 10/13/2016 but close to baseline 10/22/2016 but was abnormal suggesting fluid accumulation from 09/30/2016 to 10/13/2016.  Prescribed dosage: Furosemide 20 mg 1 tablet daily. Spironolactone 25 mg 1 tablet daily.  Labs: 03/28/2016 Creatinine 1.26, BUN 18, Potassium 4.8, Sodium 137 12/08/2017Creatinine 1.40, BUN 31, Potassium 4.3, Sodium 141  06/09/2017Creatinine 1.36, BUN 27, Potassium 4.8, Sodium 133  02/09/2017Creatinine 1.18, BUN 21, Potassium 4.4, Sodium 138   Recommendations: Left voice mail with ICM number and encouraged to call for fluid symptoms..  Follow-up plan: ICM clinic phone appointment on 11/23/2016.    Copy of ICM check sent to device physician.   3 month ICM trend: 10/23/2016   1 Year ICM trend:      Rosalene Billings, RN 10/23/2016 9:01 AM

## 2016-10-23 NOTE — Telephone Encounter (Signed)
Remote ICM transmission received.  Attempted patient call and left detailed message regarding transmission and next ICM scheduled for 11/23/2016.  Advised to return call for any fluid symptoms or questions.

## 2016-10-24 DIAGNOSIS — M19019 Primary osteoarthritis, unspecified shoulder: Secondary | ICD-10-CM | POA: Diagnosis not present

## 2016-11-05 DIAGNOSIS — M25519 Pain in unspecified shoulder: Secondary | ICD-10-CM | POA: Diagnosis not present

## 2016-11-05 DIAGNOSIS — M4697 Unspecified inflammatory spondylopathy, lumbosacral region: Secondary | ICD-10-CM | POA: Diagnosis not present

## 2016-11-05 DIAGNOSIS — M47816 Spondylosis without myelopathy or radiculopathy, lumbar region: Secondary | ICD-10-CM | POA: Diagnosis not present

## 2016-11-05 DIAGNOSIS — G894 Chronic pain syndrome: Secondary | ICD-10-CM | POA: Diagnosis not present

## 2016-11-07 DIAGNOSIS — J029 Acute pharyngitis, unspecified: Secondary | ICD-10-CM | POA: Diagnosis not present

## 2016-11-07 DIAGNOSIS — E785 Hyperlipidemia, unspecified: Secondary | ICD-10-CM | POA: Diagnosis not present

## 2016-11-07 DIAGNOSIS — Z23 Encounter for immunization: Secondary | ICD-10-CM | POA: Diagnosis not present

## 2016-11-07 DIAGNOSIS — E119 Type 2 diabetes mellitus without complications: Secondary | ICD-10-CM | POA: Diagnosis not present

## 2016-11-07 DIAGNOSIS — G2581 Restless legs syndrome: Secondary | ICD-10-CM | POA: Diagnosis not present

## 2016-11-07 DIAGNOSIS — I4891 Unspecified atrial fibrillation: Secondary | ICD-10-CM | POA: Diagnosis not present

## 2016-11-07 DIAGNOSIS — E291 Testicular hypofunction: Secondary | ICD-10-CM | POA: Diagnosis not present

## 2016-11-07 DIAGNOSIS — I10 Essential (primary) hypertension: Secondary | ICD-10-CM | POA: Diagnosis not present

## 2016-11-23 ENCOUNTER — Telehealth: Payer: Self-pay

## 2016-11-23 ENCOUNTER — Ambulatory Visit (INDEPENDENT_AMBULATORY_CARE_PROVIDER_SITE_OTHER): Payer: Medicare Other

## 2016-11-23 DIAGNOSIS — Z95810 Presence of automatic (implantable) cardiac defibrillator: Secondary | ICD-10-CM

## 2016-11-23 DIAGNOSIS — I5022 Chronic systolic (congestive) heart failure: Secondary | ICD-10-CM | POA: Diagnosis not present

## 2016-11-23 NOTE — Telephone Encounter (Signed)
LMOVM requesting that pt send manual transmission 

## 2016-11-23 NOTE — Progress Notes (Signed)
EPIC Encounter for ICM Monitoring  Patient Name: Walter [name] is a 71 y.o. male Date: 11/23/2016 Primary Care Physican: London Pepper, MD Primary Cardiologist:McLean Electrophysiologist: Allred Dry Weight:unknown Bi-V Pacing:  92%      Heart Failure questions reviewed, pt symptomatic with some ankle swelling which has resolved today.   Thoracic impedance normal today but has been abnormal suggesting fluid accumulation intermittently since 10/30/2016.  Prescribed dosage: Furosemide 20 mg 1 tablet daily. Spironolactone 25 mg 1 tablet daily.  Labs: 03/28/2016 Creatinine 1.26, BUN 18, Potassium 4.8, Sodium 137 12/08/2017Creatinine 1.40, BUN 31, Potassium 4.3, Sodium 141  06/09/2017Creatinine 1.36, BUN 27, Potassium 4.8, Sodium 133  02/09/2017Creatinine 1.18, BUN 21, Potassium 4.4, Sodium 138   Recommendations: No changes.  Advised to limit salt intake to 2000 mg/day and fluid intake to < 2 liters/day.  Encouraged to call for fluid symptoms.  Follow-up plan: ICM clinic phone appointment on 12/24/2016.  Will be scheduling office with with [name] for December.    Copy of ICM check sent to Dr. Rayann Heman.   3 month ICM trend: 11/23/2016   1 Year ICM trend:      Rosalene Billings, RN 11/23/2016 11:33 AM

## 2016-12-03 DIAGNOSIS — G894 Chronic pain syndrome: Secondary | ICD-10-CM | POA: Diagnosis not present

## 2016-12-03 DIAGNOSIS — M25519 Pain in unspecified shoulder: Secondary | ICD-10-CM | POA: Diagnosis not present

## 2016-12-03 DIAGNOSIS — M1990 Unspecified osteoarthritis, unspecified site: Secondary | ICD-10-CM | POA: Diagnosis not present

## 2016-12-03 DIAGNOSIS — Z79891 Long term (current) use of opiate analgesic: Secondary | ICD-10-CM | POA: Diagnosis not present

## 2016-12-03 DIAGNOSIS — Z79899 Other long term (current) drug therapy: Secondary | ICD-10-CM | POA: Diagnosis not present

## 2016-12-03 DIAGNOSIS — M4697 Unspecified inflammatory spondylopathy, lumbosacral region: Secondary | ICD-10-CM | POA: Diagnosis not present

## 2016-12-24 ENCOUNTER — Ambulatory Visit (INDEPENDENT_AMBULATORY_CARE_PROVIDER_SITE_OTHER): Payer: Medicare Other | Admitting: *Deleted

## 2016-12-24 ENCOUNTER — Telehealth: Payer: Self-pay | Admitting: Cardiology

## 2016-12-24 DIAGNOSIS — Z95810 Presence of automatic (implantable) cardiac defibrillator: Secondary | ICD-10-CM | POA: Diagnosis not present

## 2016-12-24 DIAGNOSIS — I428 Other cardiomyopathies: Secondary | ICD-10-CM | POA: Diagnosis not present

## 2016-12-24 DIAGNOSIS — I5022 Chronic systolic (congestive) heart failure: Secondary | ICD-10-CM | POA: Diagnosis not present

## 2016-12-24 NOTE — Telephone Encounter (Signed)
Confirmed remote transmission w/ pt.   

## 2016-12-25 ENCOUNTER — Telehealth: Payer: Self-pay

## 2016-12-25 NOTE — Progress Notes (Signed)
EPIC Encounter for ICM Monitoring  Patient Name: Walter [name] is a 71 y.o. male Date: 12/25/2016 Primary Care Physican: London Pepper, MD Primary Cardiologist:McLean Electrophysiologist: Allred Dry Weight:unknown Bi-V Pacing: 93%        Attempted call to patient and unable to reach.  Left message to return call.  Transmission reviewed.    Thoracic impedance abnormal suggesting fluid accumulation and getting close to baseline.  Prescribed dosage: Furosemide 20 mg 1 tablet daily.   Labs: 03/28/2016 Creatinine 1.26, BUN 18, Potassium 4.8, Sodium 137 12/08/2017Creatinine 1.40, BUN 31, Potassium 4.3, Sodium 141  06/09/2017Creatinine 1.36, BUN 27, Potassium 4.8, Sodium 133  02/09/2017Creatinine 1.18, BUN 21, Potassium 4.4, Sodium 138   Recommendations: NONE - Unable to reach patient   Follow-up plan: ICM clinic phone appointment on 01/08/2017.  Office appointment scheduled 12/27/2016 with Dr. Aundra Dubin.  Copy of ICM check sent to Dr. Rayann Heman.   3 month ICM trend: 12/24/2016   1 Year ICM trend:      Rosalene Billings, RN 12/25/2016 4:50 PM

## 2016-12-25 NOTE — Progress Notes (Signed)
Remote ICD transmission.   

## 2016-12-25 NOTE — Telephone Encounter (Signed)
Remote ICM transmission received.  Attempted call to patient and left message to return call. 

## 2016-12-27 ENCOUNTER — Encounter (HOSPITAL_COMMUNITY): Payer: Medicare Other | Admitting: Cardiology

## 2016-12-28 ENCOUNTER — Encounter: Payer: Self-pay | Admitting: Cardiology

## 2016-12-31 DIAGNOSIS — Z79899 Other long term (current) drug therapy: Secondary | ICD-10-CM | POA: Diagnosis not present

## 2016-12-31 DIAGNOSIS — M4697 Unspecified inflammatory spondylopathy, lumbosacral region: Secondary | ICD-10-CM | POA: Diagnosis not present

## 2016-12-31 DIAGNOSIS — G894 Chronic pain syndrome: Secondary | ICD-10-CM | POA: Diagnosis not present

## 2016-12-31 DIAGNOSIS — M47816 Spondylosis without myelopathy or radiculopathy, lumbar region: Secondary | ICD-10-CM | POA: Diagnosis not present

## 2016-12-31 DIAGNOSIS — Z79891 Long term (current) use of opiate analgesic: Secondary | ICD-10-CM | POA: Diagnosis not present

## 2016-12-31 DIAGNOSIS — M1990 Unspecified osteoarthritis, unspecified site: Secondary | ICD-10-CM | POA: Diagnosis not present

## 2017-01-03 ENCOUNTER — Encounter (HOSPITAL_COMMUNITY): Payer: Self-pay | Admitting: Cardiology

## 2017-01-03 ENCOUNTER — Encounter: Payer: Self-pay | Admitting: Cardiology

## 2017-01-03 ENCOUNTER — Ambulatory Visit (HOSPITAL_COMMUNITY)
Admission: RE | Admit: 2017-01-03 | Discharge: 2017-01-03 | Disposition: A | Discharge status: Home / Self Care | Payer: Medicare Other | Source: Ambulatory Visit | Attending: Cardiology | Admitting: Cardiology

## 2017-01-03 VITALS — BP 142/98 | HR 67 | Wt 258.8 lb

## 2017-01-03 DIAGNOSIS — I480 Paroxysmal atrial fibrillation: Secondary | ICD-10-CM

## 2017-01-03 DIAGNOSIS — I447 Left bundle-branch block, unspecified: Secondary | ICD-10-CM | POA: Diagnosis not present

## 2017-01-03 DIAGNOSIS — Z79899 Other long term (current) drug therapy: Secondary | ICD-10-CM | POA: Diagnosis not present

## 2017-01-03 DIAGNOSIS — D6851 Activated protein C resistance: Secondary | ICD-10-CM | POA: Insufficient documentation

## 2017-01-03 DIAGNOSIS — E7849 Other hyperlipidemia: Secondary | ICD-10-CM | POA: Diagnosis not present

## 2017-01-03 DIAGNOSIS — G2581 Restless legs syndrome: Secondary | ICD-10-CM | POA: Diagnosis not present

## 2017-01-03 DIAGNOSIS — E119 Type 2 diabetes mellitus without complications: Secondary | ICD-10-CM | POA: Insufficient documentation

## 2017-01-03 DIAGNOSIS — Z7901 Long term (current) use of anticoagulants: Secondary | ICD-10-CM | POA: Diagnosis not present

## 2017-01-03 DIAGNOSIS — M791 Myalgia, unspecified site: Secondary | ICD-10-CM | POA: Insufficient documentation

## 2017-01-03 DIAGNOSIS — E785 Hyperlipidemia, unspecified: Secondary | ICD-10-CM | POA: Diagnosis not present

## 2017-01-03 DIAGNOSIS — Z7984 Long term (current) use of oral hypoglycemic drugs: Secondary | ICD-10-CM | POA: Insufficient documentation

## 2017-01-03 DIAGNOSIS — I10 Essential (primary) hypertension: Secondary | ICD-10-CM | POA: Diagnosis not present

## 2017-01-03 DIAGNOSIS — I519 Heart disease, unspecified: Secondary | ICD-10-CM

## 2017-01-03 DIAGNOSIS — I2510 Atherosclerotic heart disease of native coronary artery without angina pectoris: Secondary | ICD-10-CM | POA: Insufficient documentation

## 2017-01-03 DIAGNOSIS — I428 Other cardiomyopathies: Secondary | ICD-10-CM | POA: Diagnosis not present

## 2017-01-03 DIAGNOSIS — K219 Gastro-esophageal reflux disease without esophagitis: Secondary | ICD-10-CM | POA: Insufficient documentation

## 2017-01-03 LAB — COMPREHENSIVE METABOLIC PANEL
ALK PHOS: 49 U/L (ref 38–126)
ALT: 19 U/L (ref 17–63)
ANION GAP: 10 (ref 5–15)
AST: 22 U/L (ref 15–41)
Albumin: 4.2 g/dL (ref 3.5–5.0)
BILIRUBIN TOTAL: 1.1 mg/dL (ref 0.3–1.2)
BUN: 19 mg/dL (ref 6–20)
CALCIUM: 8.9 mg/dL (ref 8.9–10.3)
CO2: 24 mmol/L (ref 22–32)
Chloride: 103 mmol/L (ref 101–111)
Creatinine, Ser: 1.27 mg/dL — ABNORMAL HIGH (ref 0.61–1.24)
GFR, EST NON AFRICAN AMERICAN: 54 mL/min — AB (ref >60)
GLUCOSE: 110 mg/dL — AB (ref 65–99)
POTASSIUM: 4.4 mmol/L (ref 3.5–5.1)
Sodium: 137 mmol/L (ref 135–145)
TOTAL PROTEIN: 6.5 g/dL (ref 6.5–8.1)

## 2017-01-03 LAB — CBC
HCT: 54 % — ABNORMAL HIGH (ref 39.0–52.0)
HEMOGLOBIN: 18 g/dL — AB (ref 13.0–17.0)
MCH: 33.1 pg (ref 26.0–34.0)
MCHC: 33.3 g/dL (ref 30.0–36.0)
MCV: 99.3 fL (ref 78.0–100.0)
Platelets: 168 10*3/uL (ref 150–400)
RBC: 5.44 MIL/uL (ref 4.22–5.81)
RDW: 14.5 % (ref 11.5–15.5)
WBC: 6.9 10*3/uL (ref 4.0–10.5)

## 2017-01-03 LAB — LIPID PANEL
CHOL/HDL RATIO: 3.8 ratio
CHOLESTEROL: 144 mg/dL (ref 0–200)
HDL: 38 mg/dL — ABNORMAL LOW (ref >40)
LDL Cholesterol: 49 mg/dL (ref 0–99)
TRIGLYCERIDES: 287 mg/dL — AB (ref <150)
VLDL: 57 mg/dL — AB (ref 0–40)

## 2017-01-03 LAB — TSH: TSH: 3.229 u[IU]/mL (ref 0.350–4.500)

## 2017-01-03 NOTE — Patient Instructions (Signed)
Labs today  We will contact you in 6 months to schedule your next appointment.  

## 2017-01-05 NOTE — Progress Notes (Signed)
Patient ID: Walter [name], male   DOB: December 31, 1945, 71 y.o.   MRN: [id number] PCP: Dr. Orland Mustard Cardiology: Dr. Aundra Dubin  71 y.o. with history of paroxysmal atrial fibrillation, chronic LBBB and cardiomyopathy presents for followup of CHF and atrial fibrillation.  He was found to have LBBB 15-20 years ago.  In 2011, he had a stress test in [facility] that was abnormal so he was taken for [facility] in 6/11 that showed nonobstructive disease.  He had an echo in 5/11 that showed EF 45-50%, diffuse hypokinesis suggesting a mild nonischemic cardiomyopathy.   He has admitted in 7/15 with atrial fibrillation with RVR.  Rate was very difficult to control.  TEE failed due to inability to pass probe x 2.  Eventually, ICE was done to rule out LAA thrombus and he was cardioverted to NSR.  He is on amiodarone now and in NSR.  Echo in 7/15 showed EF 20% with diffuse hypokinesis in setting of atrial fibrillation/RVR.  LHC was also done in 7/15 with minimal coronary disease.  Repeat echo in 10/15 showed EF 20-25%.  In 11/15, he had St Jude CRT-D device placed.  Repeat echo in 4/16 showed EF improved to 50% with mild LVH. Repeat echo in 11/17 showed EF 50-55%, mildly dilated RV with normal systolic function.   He is on Eliquis with no BRBPR or melena.  Weight is up 8 lbs. No exertional dyspnea or chest pain.  Plays golf fairly regularly.  No orthopnea/PND, no palpitations.  He is in NSR today. Minimal tremor now.  No BRBPR/melena.  Device interrogation today showed no atrial fibrillation or VT, stable thoracic impedance.    Labs (11/14): K 3.9, creatinine 1.14, LDL 104, LFTs normal Labs (2/15): HCT 56.3 Labs (9/15): K 4.1, creatinine 1.1, digoxin 0.9, TSH normal, LFTs normal Labs (10/15): TGs 262, LDL 82, BNP 68 Labs (11/15): K 4.1, creatinine 1.12 Labs (12/15): K 4.8, creatinine 1.02 Labs (3/16): K 4.6, creatinine 1.06, digoxin 0.8, LFTs normal, TSH normal Labs (11/16): K 4.2, creatinine 1.17 => 1.3, HCT 56.2, TSH normal,  LDL 75, HDL 34, LFTs normal Labs (2/17): K 4.4, creatinine 1.18, BNP 20, HCT 50.8, LFTs normal Labs (12/17): K 4.3, creatinine 1.4 Labs (1/18): K 4.8, creatinine 1.26, hgb 18.1, LFTs normal, TSH normal.   ECG (personally reviewed): a-sensed, BiV paced  PMH: 1. Type II diabetes 2. HTN 3. Adrenal nodule 4. Restless leg syndrome 5. OA with right hip pain, right frozen shoulder, low back pain. S/p R THR in 12/16.  6. Testicular cancer: 1972 7. Chronic LBBB: Had Cardiolite in 2011 that was abnormal so had LHC in 6/11 in [place], [place].  This showed 30% LCx stenosis and 40% ostial RCA stenosis.  LHC in 7/15 in [place] showed minimal coronary disease.  8. 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  Echo (5/11) with EF 45-50%, moderate LVH.  Echo (7/15) with EF 20%, diffuse hypokinesis, mild to moderately decreased RV systolic function (in setting of afib with RVR). LHC (7/15) with minimal coronary disease.  Echo (10/15) with EF 20-25%, mild LVH, moderate LAE. St Jude CRT-D device placed in 11/15. Echo (4/16) with EF 50%, mild LVH.  - Echo (11/17): EF 50-55%, mildly dilated RV with normal systolic function.  9. Factor V Leiden + 10. Asbestosis: Mild.  Exposure while in the [employer].  11. GERD 12. PVCs 13. Polycythemia: Uncertain etiology 14. Hyperlipidemia: Myalgias with Zocor and Lipitor.  15. Atrial fibrillation: Paroxysmal.  Unable to pass TEE probe.  SH: Retired from [employer], lives with wife in [place].  2 daughters.  Occasional ETOH.  Quit smoking > 40 years ago.   FH: No premature CAD  ROS: All systems reviewed and negative except as per HPI.    Current Outpatient Prescriptions  Medication Sig Dispense Refill  . carvedilol (COREG) 12.5 MG tablet TAKE 1 TABLET TWICE A DAY 180 tablet 3  . cholecalciferol (VITAMIN D) 1000 UNITS tablet Take 1,000 Units by mouth every morning.     . diclofenac (FLECTOR) 1.3 %  PTCH Place 1 patch onto the skin 2 (two) times daily as needed (pain).     Marland Kitchen ELIQUIS 5 MG TABS tablet TAKE 1 TABLET TWICE A DAY 180 tablet 3  . esomeprazole (NEXIUM) 40 MG capsule Take 40 mg by mouth daily at 12 noon.    . furosemide (LASIX) 20 MG tablet TAKE 1 TABLET DAILY 90 tablet 3  . furosemide (LASIX) 40 MG tablet Take 1 tablet (40 mg total) by mouth every other day. 45 tablet 3  . gabapentin (NEURONTIN) 300 MG capsule Take 600 mg by mouth every evening.     Marland Kitchen lisinopril (PRINIVIL,ZESTRIL) 10 MG tablet TAKE 1 TABLET EVERY EVENING 90 tablet 3  . lovastatin (ALTOPREV) 40 MG 24 hr tablet Take 40 mg by mouth at bedtime.    . magnesium oxide (MAG-OX) 400 MG tablet Take 400 mg by mouth every morning.     . metFORMIN (GLUCOPHAGE) 1000 MG tablet Take 1,000 mg by mouth 2 (two) times daily with a meal.    . omega-3 acid ethyl esters (LOVAZA) 1 G capsule Take 1 g by mouth 2 (two) times daily.    Marland Kitchen oxyCODONE-acetaminophen (PERCOCET) 10-325 MG tablet     . PACERONE 200 MG tablet TAKE 1 TABLET DAILY 90 tablet 3  . rOPINIRole (REQUIP) 0.5 MG tablet Take 0.5 mg by mouth daily as needed (RLS).     Marland Kitchen rOPINIRole (REQUIP) 1 MG tablet     . sildenafil (VIAGRA) 100 MG tablet Take 100 mg by mouth daily as needed for erectile dysfunction.    Marland Kitchen spironolactone (ALDACTONE) 25 MG tablet TAKE 1 TABLET DAILY 90 tablet 3  . testosterone cypionate (DEPO-TESTOSTERONE) 200 MG/ML injection Inject 200 mg into the muscle every Thursday.     Marland Kitchen tiZANidine (ZANAFLEX) 4 MG capsule Take 4 mg by mouth 2 (two) times daily as needed for muscle spasms.      No current facility-administered medications for this encounter.    BP (!) 142/98   Pulse 67   Wt 258 lb 12.8 oz (117.4 kg)   SpO2 95%   BMI 35.10 kg/m  General: NAD Neck: JVP 8 cm, no thyromegaly or thyroid nodule.  Lungs: Clear to auscultation bilaterally with normal respiratory effort. CV: Nondisplaced PMI.  Heart regular S1/S2, no S3/S4, no murmur.  1+ ankle edema.   No carotid bruit.  Normal pedal pulses.  Abdomen: Soft, nontender, no hepatosplenomegaly, no distention.  Skin: Intact without lesions or rashes.  Neurologic: Alert and oriented x 3.  Psych: Normal affect. Extremities: No clubbing or cyanosis.  HEENT: Normal.   Assessment/Plan: 1. Cardiomyopathy: Nonischemic.  There was a pre-existing possible LBBB cardiomyopathy, but EF was down to 20% in the setting of atrial fibrillation with RVR in 7/15 (suggesting tachycardia-mediated cardiomyopathy).  However, EF did not improve in NSR (20-25% on repeat echo in 10/15).  Now with St Jude CRT-D device.  No significant coronary disease on 7/15 LHC.   Since  placement of CRT-D, EF has improved to 50-55% (11/17 echo).  NYHA class I-II symptoms.  Probably mild volume overload today.    - Continue current Coreg, spironolactone, and lisinopril. - He is using Lasix prn, mild volume overload today and would take a dose.   2. CAD: Nonobstructive, mild.  Continue statin.  No aspirin as he is anticoagulated.  3. Hyperlipidemia: Continue lovastatin.  Myalgias with other statins.   4. HTN: BP is controlled at home, typically 120s-130s when he checks.  5. Atrial fibrillation: Paroxysmal, now in NSR on amiodarone 100 mg daily => dose decreased due to tremor which is now improved.  Needs to maintain NSR as possible component of tachy-mediated cardiomyopathy.   - Continue current Eliquis, will check CBC today.  - Given amiodarone use, check LFTs/TSH today. Will need yearly eye exam.   Followup in 6 months.   Loralie Champagne 01/05/2017

## 2017-01-08 ENCOUNTER — Encounter (HOSPITAL_COMMUNITY): Payer: Self-pay

## 2017-01-10 NOTE — Progress Notes (Signed)
No ICM remote transmission received for 01/08/2017 and next ICM transmission scheduled for 01/25/2017.

## 2017-01-11 ENCOUNTER — Encounter: Payer: Self-pay | Admitting: Cardiology

## 2017-01-14 LAB — CUP PACEART REMOTE DEVICE CHECK
Brady Statistic AP VS Percent: 1 %
Brady Statistic AS VP Percent: 83 %
Brady Statistic AS VS Percent: 3.6 %
HighPow Impedance: 71 Ohm
HighPow Impedance: 71 Ohm
Implantable Lead Implant Date: 20151119
Implantable Lead Implant Date: 20151119
Implantable Lead Location: 753858
Implantable Lead Location: 753860
Implantable Pulse Generator Implant Date: 20151119
Lead Channel Impedance Value: 430 Ohm
Lead Channel Pacing Threshold Amplitude: 0.75 V
Lead Channel Pacing Threshold Pulse Width: 0.5 ms
Lead Channel Sensing Intrinsic Amplitude: 2.6 mV
Lead Channel Setting Pacing Amplitude: 1.5 V
Lead Channel Setting Pacing Amplitude: 2 V
Lead Channel Setting Pacing Amplitude: 2.5 V
Lead Channel Setting Pacing Pulse Width: 0.5 ms
Lead Channel Setting Pacing Pulse Width: 0.5 ms
MDC IDC LEAD IMPLANT DT: 20151119
MDC IDC LEAD LOCATION: 753859
MDC IDC MSMT BATTERY REMAINING LONGEVITY: 49 mo
MDC IDC MSMT BATTERY REMAINING PERCENTAGE: 59 %
MDC IDC MSMT BATTERY VOLTAGE: 2.93 V
MDC IDC MSMT LEADCHNL LV IMPEDANCE VALUE: 540 Ohm
MDC IDC MSMT LEADCHNL LV PACING THRESHOLD AMPLITUDE: 0.75 V
MDC IDC MSMT LEADCHNL LV PACING THRESHOLD PULSEWIDTH: 0.5 ms
MDC IDC MSMT LEADCHNL RV IMPEDANCE VALUE: 380 Ohm
MDC IDC MSMT LEADCHNL RV PACING THRESHOLD AMPLITUDE: 0.75 V
MDC IDC MSMT LEADCHNL RV PACING THRESHOLD PULSEWIDTH: 0.5 ms
MDC IDC MSMT LEADCHNL RV SENSING INTR AMPL: 12 mV
MDC IDC PG SERIAL: 7211486
MDC IDC SESS DTM: 20181008194754
MDC IDC SET LEADCHNL RV SENSING SENSITIVITY: 0.5 mV
MDC IDC STAT BRADY AP VP PERCENT: 11 %
MDC IDC STAT BRADY RA PERCENT PACED: 8 %

## 2017-01-25 ENCOUNTER — Telehealth: Payer: Self-pay | Admitting: Cardiology

## 2017-01-25 ENCOUNTER — Ambulatory Visit (INDEPENDENT_AMBULATORY_CARE_PROVIDER_SITE_OTHER): Payer: Medicare Other

## 2017-01-25 DIAGNOSIS — Z95810 Presence of automatic (implantable) cardiac defibrillator: Secondary | ICD-10-CM

## 2017-01-25 DIAGNOSIS — I5022 Chronic systolic (congestive) heart failure: Secondary | ICD-10-CM

## 2017-01-25 NOTE — Progress Notes (Signed)
EPIC Encounter for ICM Monitoring  Patient Name: Walter [name] is a 71 y.o. male Date: 01/25/2017 Primary Care Physican: London Pepper, MD Primary Cardiologist:[place] Electrophysiologist: Allred Dry Weight:  252 lbs Bi-V Pacing: 94%      Heart Failure questions reviewed, pt asymptomatic.   [place]: Thoracic impedance normal.  Prescribed dosage: Furosemide 20 mg 1 tablet daily.   Labs: 01/03/2017 Creatinine 1.27, BUN 19, Potassium 4.4, Sodium 137 03/28/2016 Creatinine 1.26, BUN 18, Potassium 4.8, Sodium 137 12/08/2017Creatinine 1.40, BUN 31, Potassium 4.3, Sodium 141  06/09/2017Creatinine 1.36, BUN 27, Potassium 4.8, Sodium 133  02/09/2017Creatinine 1.18, BUN 21, Potassium 4.4, Sodium 138   Recommendations: No changes.  Encouraged to call for fluid symptoms.  Follow-up plan: ICM clinic phone appointment on 04/02/2017.  Office appointment scheduled 02/25/2017 with Dr. Rayann Heman.  Copy of ICM check sent to Dr. Rayann Heman.   3 month ICM trend: 01/25/2017    1 Year ICM trend:       Rosalene Billings, RN 01/25/2017 1:15 PM

## 2017-01-25 NOTE — Telephone Encounter (Signed)
LMOVM reminding pt to send remote transmission.   

## 2017-01-31 DIAGNOSIS — Z79891 Long term (current) use of opiate analgesic: Secondary | ICD-10-CM | POA: Diagnosis not present

## 2017-01-31 DIAGNOSIS — G894 Chronic pain syndrome: Secondary | ICD-10-CM | POA: Diagnosis not present

## 2017-01-31 DIAGNOSIS — M1990 Unspecified osteoarthritis, unspecified site: Secondary | ICD-10-CM | POA: Diagnosis not present

## 2017-01-31 DIAGNOSIS — M25519 Pain in unspecified shoulder: Secondary | ICD-10-CM | POA: Diagnosis not present

## 2017-01-31 DIAGNOSIS — Z79899 Other long term (current) drug therapy: Secondary | ICD-10-CM | POA: Diagnosis not present

## 2017-01-31 DIAGNOSIS — M47817 Spondylosis without myelopathy or radiculopathy, lumbosacral region: Secondary | ICD-10-CM | POA: Diagnosis not present

## 2017-02-15 ENCOUNTER — Encounter: Payer: Self-pay | Admitting: Internal Medicine

## 2017-02-25 ENCOUNTER — Encounter: Payer: Medicare Other | Admitting: Internal Medicine

## 2017-03-04 DIAGNOSIS — M1990 Unspecified osteoarthritis, unspecified site: Secondary | ICD-10-CM | POA: Diagnosis not present

## 2017-03-04 DIAGNOSIS — M47817 Spondylosis without myelopathy or radiculopathy, lumbosacral region: Secondary | ICD-10-CM | POA: Diagnosis not present

## 2017-03-04 DIAGNOSIS — G894 Chronic pain syndrome: Secondary | ICD-10-CM | POA: Diagnosis not present

## 2017-03-04 DIAGNOSIS — M25519 Pain in unspecified shoulder: Secondary | ICD-10-CM | POA: Diagnosis not present

## 2017-03-06 ENCOUNTER — Other Ambulatory Visit: Payer: Self-pay | Admitting: Orthopedic Surgery

## 2017-03-06 DIAGNOSIS — M25511 Pain in right shoulder: Secondary | ICD-10-CM | POA: Diagnosis not present

## 2017-03-07 DIAGNOSIS — M47817 Spondylosis without myelopathy or radiculopathy, lumbosacral region: Secondary | ICD-10-CM | POA: Diagnosis not present

## 2017-03-11 ENCOUNTER — Other Ambulatory Visit: Payer: Medicare Other

## 2017-03-13 ENCOUNTER — Ambulatory Visit
Admission: RE | Admit: 2017-03-13 | Discharge: 2017-03-13 | Disposition: A | Discharge status: Home / Self Care | Payer: Medicare Other | Source: Ambulatory Visit | Attending: Orthopedic Surgery | Admitting: Orthopedic Surgery

## 2017-03-13 DIAGNOSIS — M25511 Pain in right shoulder: Secondary | ICD-10-CM

## 2017-03-13 DIAGNOSIS — M19011 Primary osteoarthritis, right shoulder: Secondary | ICD-10-CM | POA: Diagnosis not present

## 2017-03-22 ENCOUNTER — Encounter (HOSPITAL_COMMUNITY): Payer: Self-pay

## 2017-04-02 ENCOUNTER — Telehealth: Payer: Self-pay | Admitting: Cardiology

## 2017-04-02 ENCOUNTER — Encounter: Payer: Medicare Other | Admitting: *Deleted

## 2017-04-02 NOTE — Telephone Encounter (Signed)
Spoke with pt and reminded pt of remote transmission that is due today. Pt verbalized understanding.   

## 2017-04-04 ENCOUNTER — Encounter: Payer: Self-pay | Admitting: Cardiology

## 2017-04-04 NOTE — Progress Notes (Signed)
Letter  

## 2017-04-11 ENCOUNTER — Ambulatory Visit (INDEPENDENT_AMBULATORY_CARE_PROVIDER_SITE_OTHER): Payer: Medicare Other | Admitting: Internal Medicine

## 2017-04-11 ENCOUNTER — Encounter: Payer: Self-pay | Admitting: Internal Medicine

## 2017-04-11 VITALS — BP 128/74 | HR 85 | Ht 72.0 in | Wt 264.0 lb

## 2017-04-11 DIAGNOSIS — I447 Left bundle-branch block, unspecified: Secondary | ICD-10-CM | POA: Diagnosis not present

## 2017-04-11 DIAGNOSIS — I480 Paroxysmal atrial fibrillation: Secondary | ICD-10-CM | POA: Diagnosis not present

## 2017-04-11 DIAGNOSIS — Z95810 Presence of automatic (implantable) cardiac defibrillator: Secondary | ICD-10-CM

## 2017-04-11 DIAGNOSIS — I428 Other cardiomyopathies: Secondary | ICD-10-CM | POA: Diagnosis not present

## 2017-04-11 DIAGNOSIS — I519 Heart disease, unspecified: Secondary | ICD-10-CM

## 2017-04-11 MED ORDER — AMIODARONE HCL 200 MG PO TABS: 100.0000 mg | ORAL_TABLET | Freq: Every day | ORAL | 3 refills | Status: DC

## 2017-04-11 NOTE — Progress Notes (Signed)
PCP: London Pepper, MD Primary Cardiologist:  Dr Aundra Dubin Primary EP: [name]  Walter [name] is a 72 y.o. male who presents today for routine electrophysiology followup.  Since last being seen in our clinic, the patient reports doing very well.  Today, he denies symptoms of palpitations, chest pain, shortness of breath,  dizziness, presyncope, syncope, or ICD shocks. + stable edema  The patient is otherwise without complaint today.   Past Medical History:  Diagnosis Date  . Asbestosis ([place])    mild  . Atrial fibrillation ([place])   . Chronic low back pain    /sciatica- Dr Letta Median [facility])  . Complication of anesthesia 1996   previous surgery had a run of v-tach  . Coronary artery disease, non-occlusive June 2011   Cardiac cath in [facility], The following abnormal Myoview  . DJD (degenerative joint disease)    /osteoarthritis  . Dyslipidemia, goal LDL below 100    On lovastatin (myalgias with Zocor and Lipitor)  . Essential hypertension   . Factor V Leiden ([place])   . Frozen shoulder    right  . GERD (gastroesophageal reflux disease)   . Hypogonadism male   . Irritable bowel   . Kidney stone   . Left bundle branch block (LBBB) on electrocardiogram    Diagnosed close to 20 years ago  . Myocardial infarction ([place])   . Non-ischemic cardiomyopathy [facility]) June 2011   EF 45-50%; suspect related to LBBB; repeat Echo March 2015: EF 50- 55%  . Obesity   . Restless leg syndrome   . Sleep apnea    does not use cpap  . Testicular cancer ([place]) 1972   left  . Tremor, essential 12/09/2015  . Type 2 diabetes mellitus without complication ([place]) 06/84   Type 2.    Past Surgical History:  Procedure Laterality Date  . APPENDECTOMY N/A   . BI-VENTRICULAR IMPLANTABLE CARDIOVERTER DEFIBRILLATOR N/A 02/04/2014   SJM Quadra Assura BiV ICD implanted for primary prevention by [name]  . CARDIAC CATHETERIZATION  June 2011   Nonobstructive CAD  . CARDIAC DEFIBRILLATOR  PLACEMENT  02/04/14   [facility] model [place] 3365-40Q (serial Number [id number]) biventricular ICD.  Marland Kitchen CARDIOVERSION N/A 10/13/2013   Procedure: CARDIOVERSION;  Surgeon: Larey Dresser, MD;  Location: [facility];  Service: Cardiovascular;  Laterality: N/A;  . CARDIOVERSION  10-16-2013   DCCV by [name] following intracardiac echo to rule out LAA thrombus  . CARDIOVERSION N/A 10/16/2013   Procedure: CARDIOVERSION;  Surgeon: Coralyn Mark, MD;  Location: [facility] CATH LAB;  Service: Cardiovascular;  Laterality: N/A;  . CHOLECYSTECTOMY OPEN    . ELECTROPHYSIOLOGY STUDY N/A 10/16/2013   Procedure: ELECTROPHYSIOLOGY STUDY;  Surgeon: Coralyn Mark, MD;  Location: [facility] CATH LAB;  Service: Cardiovascular;  Laterality: N/A;  . ESOPHAGOGASTRODUODENOSCOPY N/A 10/15/2013   Procedure: ESOPHAGOGASTRODUODENOSCOPY (EGD);  Surgeon: Gatha Mayer, MD;  Location: [facility] ENDOSCOPY;  Service: Endoscopy;  Laterality: N/A;  bedside  . HERNIA REPAIR    . KNEE ARTHROSCOPY Left   . LEFT AND RIGHT HEART CATHETERIZATION WITH CORONARY ANGIOGRAM N/A 10/12/2013   Procedure: LEFT AND RIGHT HEART CATHETERIZATION WITH CORONARY ANGIOGRAM;  Surgeon: Troy Sine, MD;  Location: [facility] CATH LAB;  Service: Cardiovascular;  Laterality: N/A;  . lymph nodes    . ROTATOR CUFF REPAIR Right   . SURGERY SCROTAL / TESTICULAR Left   . TONSILLECTOMY Bilateral   . TOTAL HIP ARTHROPLASTY Right 02/22/2015   Procedure: RIGHT  TOTAL HIP ARTHROPLASTY ANTERIOR APPROACH;  Surgeon: Renette Butters, MD;  Location: [facility];  Service: Orthopedics;  Laterality: Right;  . TRANSTHORACIC ECHOCARDIOGRAM  May 2011   EF 45% with diffuse hypokinesis; septal bounce from LBBB  . TRANSTHORACIC ECHOCARDIOGRAM  March 2015   EF 50-55%. Mild concentric LVH. Septal dyssynergy from LBBB     ROS- all systems are reviewed and negative except as per HPI above  Current Outpatient Medications  Medication Sig Dispense Refill  . carvedilol (COREG) 12.5 MG tablet TAKE 1  TABLET TWICE A DAY 180 tablet 3  . cholecalciferol (VITAMIN D) 1000 UNITS tablet Take 1,000 Units by mouth every morning.     . diclofenac (FLECTOR) 1.3 % PTCH Place 1 patch onto the skin 2 (two) times daily as needed (pain).     Marland Kitchen ELIQUIS 5 MG TABS tablet TAKE 1 TABLET TWICE A DAY 180 tablet 3  . esomeprazole (NEXIUM) 40 MG capsule Take 40 mg by mouth daily at 12 noon.    . furosemide (LASIX) 20 MG tablet TAKE 1 TABLET DAILY 90 tablet 3  . furosemide (LASIX) 40 MG tablet Take 1 tablet (40 mg total) by mouth every other day. 45 tablet 3  . gabapentin (NEURONTIN) 300 MG capsule Take 600 mg by mouth every evening.     Marland Kitchen lisinopril (PRINIVIL,ZESTRIL) 10 MG tablet TAKE 1 TABLET EVERY EVENING 90 tablet 3  . lovastatin (ALTOPREV) 40 MG 24 hr tablet Take 40 mg by mouth at bedtime.    . magnesium oxide (MAG-OX) 400 MG tablet Take 400 mg by mouth every morning.     . metFORMIN (GLUCOPHAGE) 1000 MG tablet Take 1,000 mg by mouth 2 (two) times daily with a meal.    . omega-3 acid ethyl esters (LOVAZA) 1 G capsule Take 1 g by mouth 2 (two) times daily.    Marland Kitchen oxyCODONE-acetaminophen (PERCOCET) 10-325 MG tablet     . PACERONE 200 MG tablet TAKE 1 TABLET DAILY 90 tablet 3  . rOPINIRole (REQUIP) 0.5 MG tablet Take 0.5 mg by mouth daily as needed (RLS).     Marland Kitchen rOPINIRole (REQUIP) 1 MG tablet     . sildenafil (VIAGRA) 100 MG tablet Take 100 mg by mouth daily as needed for erectile dysfunction.    Marland Kitchen spironolactone (ALDACTONE) 25 MG tablet TAKE 1 TABLET DAILY 90 tablet 3  . testosterone cypionate (DEPO-TESTOSTERONE) 200 MG/ML injection Inject 200 mg into the muscle every Thursday.     Marland Kitchen tiZANidine (ZANAFLEX) 4 MG capsule Take 4 mg by mouth 2 (two) times daily as needed for muscle spasms.      No current facility-administered medications for this visit.     Physical Exam: Vitals:   04/11/17 1042  BP: 128/74  Pulse: 85  Weight: 264 lb (119.7 kg)  Height: 6' (1.829 m)    GEN- The patient is well appearing,  alert and oriented x 3 today.   Head- normocephalic, atraumatic Eyes-  Sclera clear, conjunctiva pink Ears- hearing intact Oropharynx- clear Lungs- Clear to ausculation bilaterally, normal work of breathing Chest- ICD pocket is well healed Heart- Regular rate and rhythm, no murmurs, rubs or gallops, PMI not laterally displaced GI- soft, NT, ND, + BS Extremities- no clubbing, cyanosis, trace edema  ICD interrogation- reviewed in detail today,  See PACEART report  ekg tracing ordered today is personally reviewed and shows sinus with BiV pacing  Echo 01/30/16 reveals EF 50-55%  Assessment and Plan:  1.  Chronic systolic dysfunction euvolemic  today 2 gram sodium diet Followed by Sharman Cheek Stable on an appropriate medical regimen Normal ICD function See Pace Art report No changes today  2. Afib Well controlled with amiodarone 100mg  daily (no episodes by device interrogation) CHF clinic to follow LFTs, TFTs (labs 01/03/17 reviewed) On eliquis  3. HTN Stable No change required today  4. Erectile dysfunction We discussed options at length today He does not wish to make changes at this time He is encouraged to follow-up with urology  Continue merlin Followed in ICM device clinic with Margarita Grizzle Return to see EP NP every 1 year   Thompson Grayer MD, [facility] 04/11/2017 11:25 AM

## 2017-04-11 NOTE — Patient Instructions (Addendum)
Medication Instructions:  Your physician recommends that you continue on your current medications as directed. Please refer to the Current Medication list given to you today.   Labwork: None ordered.  Testing/Procedures: None ordered.  Follow-Up: Your physician wants you to follow-up in: One Year with Chanetta Marshall, NP. You will receive a reminder letter in the mail two months in advance. If you don't receive a letter, please call our office to schedule the follow-up appointment.  Remote monitoring is used to monitor your ICD from home. This monitoring reduces the number of office visits required to check your device to one time per year. It allows [place] to keep an eye on the functioning of your device to ensure it is working properly. You are scheduled for a device check from home on 07/11/2017. You may send your transmission at any time that day. If you have a wireless device, the transmission will be sent automatically. After your physician reviews your transmission, you will receive a postcard with your next transmission date.    Any Other Special Instructions Will Be Listed Below (If Applicable).      Low-Sodium Eating Plan Sodium, which is an element that makes up salt, helps you maintain a healthy balance of fluids in your body. Too much sodium can increase your blood pressure and cause fluid and waste to be held in your body. Your health care provider or dietitian may recommend following this plan if you have high blood pressure (hypertension), kidney disease, liver disease, or heart failure. Eating less sodium can help lower your blood pressure, reduce swelling, and protect your heart, liver, and kidneys. What are tips for following this plan? General guidelines  Most people on this plan should limit their sodium intake to 2,000 mg (milligrams) of sodium each day. Reading food labels  The Nutrition Facts label lists the amount of sodium in one serving of the food. If you eat more  than one serving, you must multiply the listed amount of sodium by the number of servings.  Choose foods with less than 140 mg of sodium per serving.  Avoid foods with 300 mg of sodium or more per serving. Shopping  Look for lower-sodium products, often labeled as "low-sodium" or "no salt added."  Always check the sodium content even if foods are labeled as "unsalted" or "no salt added".  Buy fresh foods. ? Avoid canned foods and premade or frozen meals. ? Avoid canned, cured, or processed meats  Buy breads that have less than 80 mg of sodium per slice. Cooking  Eat more home-cooked food and less restaurant, buffet, and fast food.  Avoid adding salt when cooking. Use salt-free seasonings or herbs instead of table salt or sea salt. Check with your health care provider or pharmacist before using salt substitutes.  Cook with plant-based oils, such as canola, sunflower, or olive oil. Meal planning  When eating at a restaurant, ask that your food be prepared with less salt or no salt, if possible.  Avoid foods that contain MSG (monosodium glutamate). MSG is sometimes added to [place] food, bouillon, and some canned foods. What foods are recommended? The items listed may not be a complete list. Talk with your dietitian about what dietary choices are best for you. Grains Low-sodium cereals, including oats, puffed wheat and rice, and shredded wheat. Low-sodium crackers. Unsalted rice. Unsalted pasta. Low-sodium bread. Whole-grain breads and whole-grain pasta. Vegetables Fresh or frozen vegetables. "No salt added" canned vegetables. "No salt added" tomato sauce and paste. Low-sodium or  reduced-sodium tomato and vegetable juice. Fruits Fresh, frozen, or canned fruit. Fruit juice. Meats and other protein foods Fresh or frozen (no salt added) meat, poultry, seafood, and fish. Low-sodium canned tuna and salmon. Unsalted nuts. Dried peas, beans, and lentils without added salt. Unsalted canned  beans. Eggs. Unsalted nut butters. Dairy Milk. Soy milk. Cheese that is naturally low in sodium, such as ricotta cheese, fresh mozzarella, or Swiss cheese Low-sodium or reduced-sodium cheese. Cream cheese. Yogurt. Fats and oils Unsalted butter. Unsalted margarine with no trans fat. Vegetable oils such as canola or olive oils. Seasonings and other foods Fresh and dried herbs and spices. Salt-free seasonings. Low-sodium mustard and ketchup. Sodium-free salad dressing. Sodium-free light mayonnaise. Fresh or refrigerated horseradish. Lemon juice. Vinegar. Homemade, reduced-sodium, or low-sodium soups. Unsalted popcorn and pretzels. Low-salt or salt-free chips. What foods are not recommended? The items listed may not be a complete list. Talk with your dietitian about what dietary choices are best for you. Grains Instant hot cereals. Bread stuffing, pancake, and biscuit mixes. Croutons. Seasoned rice or pasta mixes. Noodle soup cups. Boxed or frozen macaroni and cheese. Regular salted crackers. Self-rising flour. Vegetables Sauerkraut, pickled vegetables, and relishes. Olives. [place] fries. Onion rings. Regular canned vegetables (not low-sodium or reduced-sodium). Regular canned tomato sauce and paste (not low-sodium or reduced-sodium). Regular tomato and vegetable juice (not low-sodium or reduced-sodium). Frozen vegetables in sauces. Meats and other protein foods Meat or fish that is salted, canned, smoked, spiced, or pickled. Bacon, ham, sausage, hotdogs, corned beef, chipped beef, packaged lunch meats, salt pork, jerky, pickled herring, anchovies, regular canned tuna, sardines, salted nuts. Dairy Processed cheese and cheese spreads. Cheese curds. Blue cheese. Feta cheese. String cheese. Regular cottage cheese. Buttermilk. Canned milk. Fats and oils Salted butter. Regular margarine. Ghee. Bacon fat. Seasonings and other foods Onion salt, garlic salt, seasoned salt, table salt, and sea salt. Canned  and packaged gravies. Worcestershire sauce. Tartar sauce. Barbecue sauce. Teriyaki sauce. Soy sauce, including reduced-sodium. Steak sauce. Fish sauce. Oyster sauce. Cocktail sauce. Horseradish that you find on the shelf. Regular ketchup and mustard. Meat flavorings and tenderizers. Bouillon cubes. Hot sauce and Tabasco sauce. Premade or packaged marinades. Premade or packaged taco seasonings. Relishes. Regular salad dressings. Salsa. Potato and tortilla chips. Corn chips and puffs. Salted popcorn and pretzels. Canned or dried soups. Pizza. Frozen entrees and pot pies. Summary  Eating less sodium can help lower your blood pressure, reduce swelling, and protect your heart, liver, and kidneys.  Most people on this plan should limit their sodium intake to 1,500-2,000 mg (milligrams) of sodium each day.  Canned, boxed, and frozen foods are high in sodium. Restaurant foods, fast foods, and pizza are also very high in sodium. You also get sodium by adding salt to food.  Try to cook at home, eat more fresh fruits and vegetables, and eat less fast food, canned, processed, or prepared foods. This information is not intended to replace advice given to you by your health care provider. Make sure you discuss any questions you have with your health care provider. Document Released: 08/25/2001 Document Revised: 02/27/2016 Document Reviewed: 02/27/2016 Elsevier Interactive Patient Education  [employer].

## 2017-04-12 LAB — CUP PACEART INCLINIC DEVICE CHECK
Brady Statistic RA Percent Paced: 6.8 %
Brady Statistic RV Percent Paced: 95 %
Date Time Interrogation Session: 20190124170855
HIGH POWER IMPEDANCE MEASURED VALUE: 74.25 Ohm
Implantable Lead Implant Date: 20151119
Implantable Lead Implant Date: 20151119
Implantable Lead Location: 753858
Lead Channel Impedance Value: 375 Ohm
Lead Channel Impedance Value: 437.5 Ohm
Lead Channel Pacing Threshold Amplitude: 0.75 V
Lead Channel Pacing Threshold Amplitude: 0.75 V
Lead Channel Pacing Threshold Amplitude: 1 V
Lead Channel Pacing Threshold Pulse Width: 0.5 ms
Lead Channel Pacing Threshold Pulse Width: 0.5 ms
Lead Channel Sensing Intrinsic Amplitude: 12 mV
Lead Channel Sensing Intrinsic Amplitude: 3.9 mV
Lead Channel Setting Pacing Amplitude: 1.5 V
Lead Channel Setting Pacing Pulse Width: 0.5 ms
MDC IDC LEAD IMPLANT DT: 20151119
MDC IDC LEAD LOCATION: 753859
MDC IDC LEAD LOCATION: 753860
MDC IDC MSMT BATTERY REMAINING LONGEVITY: 46 mo
MDC IDC MSMT LEADCHNL LV IMPEDANCE VALUE: 562.5 Ohm
MDC IDC MSMT LEADCHNL LV PACING THRESHOLD PULSEWIDTH: 0.5 ms
MDC IDC MSMT LEADCHNL RA PACING THRESHOLD AMPLITUDE: 0.75 V
MDC IDC MSMT LEADCHNL RA PACING THRESHOLD AMPLITUDE: 0.75 V
MDC IDC MSMT LEADCHNL RA PACING THRESHOLD PULSEWIDTH: 0.5 ms
MDC IDC MSMT LEADCHNL RA PACING THRESHOLD PULSEWIDTH: 0.5 ms
MDC IDC MSMT LEADCHNL RV PACING THRESHOLD AMPLITUDE: 1 V
MDC IDC MSMT LEADCHNL RV PACING THRESHOLD PULSEWIDTH: 0.5 ms
MDC IDC PG IMPLANT DT: 20151119
MDC IDC SET LEADCHNL RA PACING AMPLITUDE: 2 V
MDC IDC SET LEADCHNL RV PACING AMPLITUDE: 2.5 V
MDC IDC SET LEADCHNL RV PACING PULSEWIDTH: 0.5 ms
MDC IDC SET LEADCHNL RV SENSING SENSITIVITY: 0.5 mV
Pulse Gen Serial Number: 7211486

## 2017-04-18 NOTE — Progress Notes (Unsigned)
No ICM remote transmission received for 04/02/2017 and next ICM transmission scheduled for 05/13/2017.

## 2017-05-01 ENCOUNTER — Other Ambulatory Visit (HOSPITAL_COMMUNITY): Payer: Self-pay | Admitting: Cardiology

## 2017-05-01 DIAGNOSIS — I519 Heart disease, unspecified: Secondary | ICD-10-CM

## 2017-05-03 ENCOUNTER — Other Ambulatory Visit: Payer: Self-pay

## 2017-05-03 ENCOUNTER — Encounter (HOSPITAL_COMMUNITY): Payer: Self-pay

## 2017-05-03 ENCOUNTER — Encounter (HOSPITAL_COMMUNITY)
Admission: RE | Admit: 2017-05-03 | Discharge: 2017-05-03 | Disposition: A | Discharge status: Home / Self Care | Payer: Medicare Other | Source: Ambulatory Visit | Attending: Orthopedic Surgery | Admitting: Orthopedic Surgery

## 2017-05-03 DIAGNOSIS — I2510 Atherosclerotic heart disease of native coronary artery without angina pectoris: Secondary | ICD-10-CM | POA: Insufficient documentation

## 2017-05-03 DIAGNOSIS — I10 Essential (primary) hypertension: Secondary | ICD-10-CM | POA: Insufficient documentation

## 2017-05-03 DIAGNOSIS — Z01812 Encounter for preprocedural laboratory examination: Secondary | ICD-10-CM | POA: Insufficient documentation

## 2017-05-03 DIAGNOSIS — Z87891 Personal history of nicotine dependence: Secondary | ICD-10-CM | POA: Diagnosis not present

## 2017-05-03 DIAGNOSIS — G4733 Obstructive sleep apnea (adult) (pediatric): Secondary | ICD-10-CM | POA: Insufficient documentation

## 2017-05-03 DIAGNOSIS — Z8547 Personal history of malignant neoplasm of testis: Secondary | ICD-10-CM | POA: Insufficient documentation

## 2017-05-03 DIAGNOSIS — E785 Hyperlipidemia, unspecified: Secondary | ICD-10-CM | POA: Diagnosis not present

## 2017-05-03 DIAGNOSIS — K219 Gastro-esophageal reflux disease without esophagitis: Secondary | ICD-10-CM | POA: Diagnosis not present

## 2017-05-03 DIAGNOSIS — Z79899 Other long term (current) drug therapy: Secondary | ICD-10-CM | POA: Insufficient documentation

## 2017-05-03 DIAGNOSIS — E119 Type 2 diabetes mellitus without complications: Secondary | ICD-10-CM | POA: Diagnosis not present

## 2017-05-03 HISTORY — DX: Other specified postprocedural states: R11.2

## 2017-05-03 HISTORY — DX: Other specified postprocedural states: Z98.890

## 2017-05-03 HISTORY — DX: Dyspnea, unspecified: R06.00

## 2017-05-03 HISTORY — DX: Personal history of urinary calculi: Z87.442

## 2017-05-03 HISTORY — DX: Cardiac arrhythmia, unspecified: I49.9

## 2017-05-03 HISTORY — DX: Anxiety disorder, unspecified: F41.9

## 2017-05-03 LAB — BASIC METABOLIC PANEL
ANION GAP: 13 (ref 5–15)
BUN: 16 mg/dL (ref 6–20)
CO2: 22 mmol/L (ref 22–32)
Calcium: 9.3 mg/dL (ref 8.9–10.3)
Chloride: 102 mmol/L (ref 101–111)
Creatinine, Ser: 1.08 mg/dL (ref 0.61–1.24)
GFR calc Af Amer: >60 mL/min (ref >60)
GLUCOSE: 137 mg/dL — AB (ref 65–99)
POTASSIUM: 4.3 mmol/L (ref 3.5–5.1)
Sodium: 137 mmol/L (ref 135–145)

## 2017-05-03 LAB — HEMOGLOBIN A1C
Hgb A1c MFr Bld: 7 % — ABNORMAL HIGH (ref 4.8–5.6)
MEAN PLASMA GLUCOSE: 154.2 mg/dL

## 2017-05-03 LAB — CBC
HEMATOCRIT: 57.9 % — AB (ref 39.0–52.0)
Hemoglobin: 19.7 g/dL — ABNORMAL HIGH (ref 13.0–17.0)
MCH: 33.1 pg (ref 26.0–34.0)
MCHC: 34 g/dL (ref 30.0–36.0)
MCV: 97.1 fL (ref 78.0–100.0)
Platelets: 179 10*3/uL (ref 150–400)
RBC: 5.96 MIL/uL — ABNORMAL HIGH (ref 4.22–5.81)
RDW: 14 % (ref 11.5–15.5)
WBC: 6.7 10*3/uL (ref 4.0–10.5)

## 2017-05-03 LAB — SURGICAL PCR SCREEN
MRSA, PCR: NEGATIVE
Staphylococcus aureus: NEGATIVE

## 2017-05-03 LAB — GLUCOSE, CAPILLARY: GLUCOSE-CAPILLARY: 233 mg/dL — AB (ref 65–99)

## 2017-05-03 NOTE — Pre-Procedure Instructions (Addendum)
Walter [name]  05/03/2017      [facility] [place], [place] - [address] [place]  [address] Phone: [phone] Fax: [phone]  [facility], [place] [address] [place] [address] Phone: [phone] Fax: [phone]    Your procedure is scheduled on 05-14-2017  Tuesday .  Report to [facility] Admitting at 8:45 A.M .  Call this number if you have problems the morning of surgery:  [phone]   Remember:  Do not eat food or drink liquids after midnight.   Take these medicines the morning of surgery with A SIP OF WATER   Albuterol Inhaler if needed Carvedilol(coreg) Oxycodone(Percocet) if needed for pain Pantoprazole(Protonix) Tizanidine(Zanaflex)  Follow MD instructions on Eliquis  STOP ASPIRIN,ANTIINFLAMATORIES (IBUPROFEN,ALEVE,MOTRIN,ADVIL,GOODY'S POWDERS),HERBAL SUPPLEMENTS,FISH OIL,AND VITAMINS 5-7 DAYS PRIOR TO SURGERY       How to Manage Your Diabetes Before and After Surgery  Why is it important to control my blood sugar before and after surgery? . Improving blood sugar levels before and after surgery helps healing and can limit problems. . A way of improving blood sugar control is eating a healthy diet by: o  Eating less sugar and carbohydrates o  Increasing activity/exercise o  Talking with your doctor about reaching your blood sugar goals . High blood sugars (greater than 180 mg/dL) can raise your risk of infections and slow your recovery, so you will need to focus on controlling your diabetes during the weeks before surgery. . Make sure that the doctor who takes care of your diabetes knows about your planned surgery including the date and location.  How do I manage my blood sugar before surgery? . Check your blood sugar at least 4 times a day, starting 2 days before surgery, to make sure that the level is not too high or  low. o Check your blood sugar the morning of your surgery when you wake up and every 2 hours until you get to the Short Stay unit. . If your blood sugar is less than 70 mg/dL, you will need to treat for low blood sugar: o Do not take insulin. o Treat a low blood sugar (less than 70 mg/dL) with  cup of clear juice (cranberry or apple), 4 glucose tablets, OR glucose gel. Recheck blood sugar in 15 minutes after treatment (to make sure it is greater than 70 mg/dL). If your blood sugar is not greater than 70 mg/dL on recheck, call [phone] o  for further instructions. . Report your blood sugar to the short stay nurse when you get to Short Stay.  . If you are admitted to the hospital after surgery: o Your blood sugar will be checked by the staff and you will probably be given insulin after surgery (instead of oral diabetes medicines) to make sure you have good blood sugar levels. o The goal for blood sugar control after surgery is 80-180 mg/dL.              WHAT DO I DO ABOUT MY DIABETES MEDICATION?   Marland Kitchen Do not take oral diabetes medicines (pills) the morning of surgery.Metformin  .        Marland Kitchen  Other Instructions:      Reviewed and Endorsed by [facility] Patient Education Committee, August 2015      Do not wear jewelry, make-up or nail polish.  Do not wear  lotions, powders, or perfumes, or deodorant.  Do not shave 48 hours prior to surgery.  Men may shave face and neck.  Do not bring valuables to the hospital.  [facility] is not responsible for any belongings or valuables.  Contacts, dentures or bridgework may not be worn into surgery.  Leave your suitcase in the car.  After surgery it may be brought to your room.  For patients admitted to the hospital, discharge time will be determined by your treatment team.  Patients discharged the day of surgery will not be allowed to drive home.      Please read over the following fact sheets that you were given. MRSA  Information and Surgical Site Infection Prevention

## 2017-05-03 NOTE — Progress Notes (Signed)
Message left  For Walter [name] [facility]) to inform him of upcoming surgery date and time

## 2017-05-03 NOTE — Progress Notes (Signed)
Cardiologist  Dr. Aundra Dubin  Pacemaker  Dr. Rayann Heman   Checks CBG's every days 3-4   Last OV Dr. Rayann Heman 04-11-2017  Perioperative device orders faxed to [facility]

## 2017-05-06 ENCOUNTER — Other Ambulatory Visit: Payer: Self-pay | Admitting: Orthopedic Surgery

## 2017-05-06 NOTE — Progress Notes (Signed)
Anesthesia Chart Review:  Pt is 72 year old male scheduled for R total shoulder arthroplasty on 05/14/2017 with Marchia Bond, MD.   - PCP is London Pepper, MD - Cardiologist is Loralie Champagne, MD who cleared pt for surgery. Last office visit 01/03/17 - EP cardiologist is Thompson Grayer, MD.  Last office visit 04/11/17  PMH includes:  CAD, MI, non-ischemic cardiomyopathy, AICD ([employer]), LBBB, atrial fibrillation, HTN, DM, hyperlipidemia, OSA, asbestosis, testicular cancer, factor V Leiden, GERD. Former smoker. BMI 36. 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 Pt to hold eliquis 2 days before surgery; I verified with pt by telephone his last dose will be 05/11/17.  Preoperative labs reviewed.   - Glucose 137, HbA1c 7.0.  - PT to be obtained DOS.   EKG 04/11/17: Atrial-sensed ventricular-paced rhythm. Biventricular pacemaker detected.   Echo 01/30/16:  - Left ventricle: The cavity size was normal. There was moderate concentric hypertrophy. Systolic function was normal. The estimated ejection fraction was in the range of 50% to 55%. Wall motion was normal; there were no regional wall motion abnormalities. Doppler parameters are consistent with abnormal left ventricular relaxation (grade 1 diastolic dysfunction). - Mitral valve: Calcified annulus. - Left atrium: The atrium was mildly dilated. Anterior-posterior dimension: 44 mm. - Right ventricle: The cavity size was mildly dilated. Wall thickness was normal. Pacer wire or catheter noted in right ventricle. - Impressions: Compared to the prior study, there has been no significant interval change.  R and L cardiac cath 10/12/13:  - Severe nonischemic cardiomyopathy - Mild coronary calcification without significant coronary obstructive disease with mild smooth 20% proximal circumflex stenosis.  - Atrial fibrillation with  rapid ventricular response - Transient profound bradycardia /near asystole requiring atropine.  Perioperative prescription for ICD indicates procedure should not interfere with device function.  No device reprogramming or magnet placement needed.  If Pt acceptable day of surgery, I anticipate pt can proceed with surgery as scheduled.   Willeen Cass, FNP-BC [facility] Short Stay Surgical Center/Anesthesiology Phone: [phone] 05/06/2017 4:29 PM

## 2017-05-13 ENCOUNTER — Telehealth: Payer: Self-pay | Admitting: Cardiology

## 2017-05-13 ENCOUNTER — Ambulatory Visit (INDEPENDENT_AMBULATORY_CARE_PROVIDER_SITE_OTHER): Payer: Medicare Other

## 2017-05-13 DIAGNOSIS — Z95810 Presence of automatic (implantable) cardiac defibrillator: Secondary | ICD-10-CM

## 2017-05-13 DIAGNOSIS — I5022 Chronic systolic (congestive) heart failure: Secondary | ICD-10-CM

## 2017-05-13 MED ORDER — CEFAZOLIN SODIUM 10 G IJ SOLR
3.0000 g | INTRAMUSCULAR | Status: AC
  Administered 2017-05-14: 3 g via INTRAVENOUS
  Filled 2017-05-13: qty 3

## 2017-05-13 NOTE — Progress Notes (Signed)
EPIC Encounter for ICM Monitoring  Patient Name: Walter [name] is a 72 y.o. male Date: 05/13/2017 Primary Care Physican: London Pepper, MD Primary Cardiologist:[place] Electrophysiologist: Allred Dry Weight:  262 lbs  Bi-V Pacing: 99%      Heart Failure questions reviewed, pt asymptomatic.  He is scheduled for shoulder surgery tomorrow.  He may be admitted for 1-2 nights.  Advised if he has any fluid symptoms after discharge, to send remote transmission and call me.     Thoracic impedance normal.  Prescribed dosage: Furosemide 40 mg 1 tablet as needed.  Taking differently:  He takes 20 mg every other day and increases if he has fluid symptoms.   Labs: 01/03/2017 Creatinine 1.27, BUN 19, Potassium 4.4, Sodium 137 03/28/2016 Creatinine 1.26, BUN 18, Potassium 4.8, Sodium 137 12/08/2017Creatinine 1.40, BUN 31, Potassium 4.3, Sodium 141  06/09/2017Creatinine 1.36, BUN 27, Potassium 4.8, Sodium 133  02/09/2017Creatinine 1.18, BUN 21, Potassium 4.4, Sodium 138  Recommendations: No changes.  Encouraged to call for fluid symptoms.  Follow-up plan: ICM clinic phone appointment on 05/21/2017 to recheck fluid levels post op shoulder surgery.    Copy of ICM check sent to Dr. Rayann Heman.   3 month ICM trend: 05/13/2017    1 Year ICM trend:       Rosalene Billings, RN 05/13/2017 3:23 PM

## 2017-05-13 NOTE — Telephone Encounter (Signed)
Spoke with pt and reminded pt of remote transmission that is due today. Pt verbalized understanding.   

## 2017-05-14 ENCOUNTER — Encounter (HOSPITAL_COMMUNITY)
Admission: RE | Disposition: A | Discharge status: Home / Self Care | Payer: Self-pay | Source: Ambulatory Visit | Attending: Orthopedic Surgery

## 2017-05-14 ENCOUNTER — Inpatient Hospital Stay (HOSPITAL_COMMUNITY): Payer: Medicare Other | Admitting: Emergency Medicine

## 2017-05-14 ENCOUNTER — Inpatient Hospital Stay (HOSPITAL_COMMUNITY)
Admission: RE | Admit: 2017-05-14 | Discharge: 2017-05-15 | DRG: 483 | Disposition: A | Discharge status: Home / Self Care | Payer: Medicare Other | Source: Ambulatory Visit | Attending: Orthopedic Surgery | Admitting: Orthopedic Surgery

## 2017-05-14 ENCOUNTER — Other Ambulatory Visit: Payer: Self-pay

## 2017-05-14 ENCOUNTER — Encounter (HOSPITAL_COMMUNITY): Payer: Self-pay | Admitting: *Deleted

## 2017-05-14 ENCOUNTER — Inpatient Hospital Stay (HOSPITAL_COMMUNITY): Payer: Medicare Other

## 2017-05-14 DIAGNOSIS — E291 Testicular hypofunction: Secondary | ICD-10-CM | POA: Diagnosis present

## 2017-05-14 DIAGNOSIS — Z96641 Presence of right artificial hip joint: Secondary | ICD-10-CM | POA: Diagnosis present

## 2017-05-14 DIAGNOSIS — I10 Essential (primary) hypertension: Secondary | ICD-10-CM | POA: Diagnosis present

## 2017-05-14 DIAGNOSIS — Z8547 Personal history of malignant neoplasm of testis: Secondary | ICD-10-CM

## 2017-05-14 DIAGNOSIS — I2510 Atherosclerotic heart disease of native coronary artery without angina pectoris: Secondary | ICD-10-CM | POA: Diagnosis present

## 2017-05-14 DIAGNOSIS — D6851 Activated protein C resistance: Secondary | ICD-10-CM | POA: Diagnosis present

## 2017-05-14 DIAGNOSIS — E119 Type 2 diabetes mellitus without complications: Secondary | ICD-10-CM | POA: Diagnosis present

## 2017-05-14 DIAGNOSIS — E785 Hyperlipidemia, unspecified: Secondary | ICD-10-CM | POA: Diagnosis present

## 2017-05-14 DIAGNOSIS — Z87891 Personal history of nicotine dependence: Secondary | ICD-10-CM | POA: Diagnosis not present

## 2017-05-14 DIAGNOSIS — Z8249 Family history of ischemic heart disease and other diseases of the circulatory system: Secondary | ICD-10-CM

## 2017-05-14 DIAGNOSIS — M12811 Other specific arthropathies, not elsewhere classified, right shoulder: Secondary | ICD-10-CM

## 2017-05-14 DIAGNOSIS — Z7901 Long term (current) use of anticoagulants: Secondary | ICD-10-CM | POA: Diagnosis not present

## 2017-05-14 DIAGNOSIS — Z95810 Presence of automatic (implantable) cardiac defibrillator: Secondary | ICD-10-CM

## 2017-05-14 DIAGNOSIS — G2581 Restless legs syndrome: Secondary | ICD-10-CM | POA: Diagnosis present

## 2017-05-14 DIAGNOSIS — K219 Gastro-esophageal reflux disease without esophagitis: Secondary | ICD-10-CM | POA: Diagnosis present

## 2017-05-14 DIAGNOSIS — M19019 Primary osteoarthritis, unspecified shoulder: Secondary | ICD-10-CM | POA: Diagnosis present

## 2017-05-14 DIAGNOSIS — Z79890 Hormone replacement therapy (postmenopausal): Secondary | ICD-10-CM | POA: Diagnosis not present

## 2017-05-14 DIAGNOSIS — M19111 Post-traumatic osteoarthritis, right shoulder: Principal | ICD-10-CM | POA: Diagnosis present

## 2017-05-14 DIAGNOSIS — I252 Old myocardial infarction: Secondary | ICD-10-CM | POA: Diagnosis not present

## 2017-05-14 DIAGNOSIS — Z96619 Presence of unspecified artificial shoulder joint: Secondary | ICD-10-CM

## 2017-05-14 HISTORY — DX: Other specific arthropathies, not elsewhere classified, right shoulder: M12.811

## 2017-05-14 HISTORY — PX: REVERSE SHOULDER ARTHROPLASTY: SHX5054

## 2017-05-14 LAB — GLUCOSE, CAPILLARY
GLUCOSE-CAPILLARY: 155 mg/dL — AB (ref 65–99)
GLUCOSE-CAPILLARY: 135 mg/dL — AB (ref 65–99)
GLUCOSE-CAPILLARY: 121 mg/dL — AB (ref 65–99)
GLUCOSE-CAPILLARY: 180 mg/dL — AB (ref 65–99)

## 2017-05-14 LAB — PROTIME-INR
INR: 1.04
PROTHROMBIN TIME: 13.5 s (ref 11.4–15.2)

## 2017-05-14 SURGERY — ARTHROPLASTY, SHOULDER, TOTAL, REVERSE
Anesthesia: General | Site: Shoulder | Laterality: Right

## 2017-05-14 MED ORDER — ALUM & MAG HYDROXIDE-SIMETH 200-200-20 MG/5ML PO SUSP: 30.0000 mL | ORAL | Status: DC | PRN

## 2017-05-14 MED ORDER — PHENYLEPHRINE HCL 10 MG/ML IJ SOLN
INTRAVENOUS | Status: DC | PRN
  Administered 2017-05-14: 25 ug/min via INTRAVENOUS

## 2017-05-14 MED ORDER — BISACODYL 10 MG RE SUPP: 10.0000 mg | Freq: Every day | RECTAL | Status: DC | PRN

## 2017-05-14 MED ORDER — DOCUSATE SODIUM 100 MG PO CAPS
100.0000 mg | ORAL_CAPSULE | Freq: Two times a day (BID) | ORAL | Status: DC
  Administered 2017-05-14: 100 mg via ORAL
  Filled 2017-05-14: qty 1

## 2017-05-14 MED ORDER — ACETAMINOPHEN 650 MG RE SUPP: 650.0000 mg | RECTAL | Status: DC | PRN

## 2017-05-14 MED ORDER — CARVEDILOL 12.5 MG PO TABS
12.5000 mg | ORAL_TABLET | Freq: Two times a day (BID) | ORAL | Status: DC
  Filled 2017-05-14: qty 1

## 2017-05-14 MED ORDER — SUGAMMADEX SODIUM 200 MG/2ML IV SOLN
INTRAVENOUS | Status: DC | PRN
  Administered 2017-05-14: 234 mg via INTRAVENOUS

## 2017-05-14 MED ORDER — FENTANYL CITRATE (PF) 100 MCG/2ML IJ SOLN
50.0000 ug | Freq: Once | INTRAMUSCULAR | Status: AC
  Administered 2017-05-14: 50 ug via INTRAVENOUS

## 2017-05-14 MED ORDER — OXYCODONE HCL 5 MG PO TABS
5.0000 mg | ORAL_TABLET | ORAL | Status: DC | PRN
  Administered 2017-05-14 (×2): 10 mg via ORAL

## 2017-05-14 MED ORDER — ROPIVACAINE HCL 5 MG/ML IJ SOLN
INTRAMUSCULAR | Status: DC | PRN
  Administered 2017-05-14: 30 mL via EPIDURAL

## 2017-05-14 MED ORDER — HYDROMORPHONE HCL 1 MG/ML IJ SOLN
INTRAMUSCULAR | Status: AC
Start: 2017-05-14 — End: 2017-05-14
  Filled 2017-05-14: qty 0.5

## 2017-05-14 MED ORDER — VITAMIN D 1000 UNITS PO TABS
1000.0000 [IU] | ORAL_TABLET | Freq: Every day | ORAL | Status: DC
  Administered 2017-05-14: 1000 [IU] via ORAL
  Filled 2017-05-14: qty 1

## 2017-05-14 MED ORDER — MIDAZOLAM HCL 2 MG/2ML IJ SOLN
2.0000 mg | Freq: Once | INTRAMUSCULAR | Status: AC
  Administered 2017-05-14: 2 mg via INTRAVENOUS

## 2017-05-14 MED ORDER — MAGNESIUM OXIDE 400 (241.3 MG) MG PO TABS
400.0000 mg | ORAL_TABLET | Freq: Every day | ORAL | Status: DC
  Administered 2017-05-14: 400 mg via ORAL
  Filled 2017-05-14: qty 1

## 2017-05-14 MED ORDER — ESMOLOL HCL 100 MG/10ML IV SOLN
INTRAVENOUS | Status: AC
  Filled 2017-05-14: qty 10

## 2017-05-14 MED ORDER — AMIODARONE HCL 100 MG PO TABS
100.0000 mg | ORAL_TABLET | Freq: Every day | ORAL | Status: DC
  Administered 2017-05-14: 100 mg via ORAL
  Filled 2017-05-14 (×2): qty 1

## 2017-05-14 MED ORDER — ONDANSETRON HCL 4 MG PO TABS: 4.0000 mg | ORAL_TABLET | Freq: Four times a day (QID) | ORAL | Status: DC | PRN

## 2017-05-14 MED ORDER — ACETAMINOPHEN 10 MG/ML IV SOLN
INTRAVENOUS | Status: DC | PRN
  Administered 2017-05-14: 1000 mg via INTRAVENOUS

## 2017-05-14 MED ORDER — GABAPENTIN 300 MG PO CAPS: 600.0000 mg | ORAL_CAPSULE | Freq: Two times a day (BID) | ORAL | Status: DC

## 2017-05-14 MED ORDER — ROCURONIUM BROMIDE 100 MG/10ML IV SOLN
INTRAVENOUS | Status: DC | PRN
  Administered 2017-05-14: 70 mg via INTRAVENOUS
  Administered 2017-05-14 (×2): 30 mg via INTRAVENOUS

## 2017-05-14 MED ORDER — ROPINIROLE HCL 1 MG PO TABS
1.0000 mg | ORAL_TABLET | Freq: Two times a day (BID) | ORAL | Status: DC
  Administered 2017-05-14: 1 mg via ORAL
  Filled 2017-05-14: qty 1

## 2017-05-14 MED ORDER — SPIRONOLACTONE 25 MG PO TABS
25.0000 mg | ORAL_TABLET | Freq: Every day | ORAL | Status: DC
  Filled 2017-05-14 (×2): qty 1

## 2017-05-14 MED ORDER — TESTOSTERONE CYPIONATE 200 MG/ML IM SOLN: 200.0000 mg | INTRAMUSCULAR | Status: DC

## 2017-05-14 MED ORDER — ACETAMINOPHEN 500 MG PO TABS
1000.0000 mg | ORAL_TABLET | Freq: Four times a day (QID) | ORAL | Status: DC
  Administered 2017-05-14 – 2017-05-15 (×3): 1000 mg via ORAL
  Filled 2017-05-14 (×3): qty 2

## 2017-05-14 MED ORDER — ACETAMINOPHEN 10 MG/ML IV SOLN
INTRAVENOUS | Status: AC
  Filled 2017-05-14: qty 100

## 2017-05-14 MED ORDER — ALBUMIN HUMAN 5 % IV SOLN
INTRAVENOUS | Status: DC | PRN
  Administered 2017-05-14: 13:00:00 via INTRAVENOUS

## 2017-05-14 MED ORDER — CLONIDINE HCL (ANALGESIA) 100 MCG/ML EP SOLN
EPIDURAL | Status: DC | PRN
Start: 2017-05-14 — End: 2017-05-14
  Administered 2017-05-14: 75 ug

## 2017-05-14 MED ORDER — ESMOLOL HCL 100 MG/10ML IV SOLN
INTRAVENOUS | Status: DC | PRN
  Administered 2017-05-14: 20 mg via INTRAVENOUS

## 2017-05-14 MED ORDER — CYANOCOBALAMIN 500 MCG PO TABS
250.0000 ug | ORAL_TABLET | Freq: Every day | ORAL | Status: DC
  Filled 2017-05-14: qty 1

## 2017-05-14 MED ORDER — PROPOFOL 10 MG/ML IV BOLUS
INTRAVENOUS | Status: AC
  Filled 2017-05-14: qty 20

## 2017-05-14 MED ORDER — ROCURONIUM BROMIDE 10 MG/ML (PF) SYRINGE
PREFILLED_SYRINGE | INTRAVENOUS | Status: AC
  Filled 2017-05-14: qty 5

## 2017-05-14 MED ORDER — HYDROMORPHONE HCL 1 MG/ML IJ SOLN
INTRAMUSCULAR | Status: DC | PRN
  Administered 2017-05-14: 0.5 mg via INTRAVENOUS

## 2017-05-14 MED ORDER — 0.9 % SODIUM CHLORIDE (POUR BTL) OPTIME
TOPICAL | Status: DC | PRN
  Administered 2017-05-14: 1000 mL

## 2017-05-14 MED ORDER — LISINOPRIL 10 MG PO TABS
10.0000 mg | ORAL_TABLET | Freq: Every evening | ORAL | Status: DC
  Administered 2017-05-14: 10 mg via ORAL
  Filled 2017-05-14: qty 1

## 2017-05-14 MED ORDER — OXYCODONE-ACETAMINOPHEN 10-325 MG PO TABS: 1.0000 | ORAL_TABLET | Freq: Four times a day (QID) | ORAL | 0 refills | Status: DC | PRN

## 2017-05-14 MED ORDER — LACTATED RINGERS IV SOLN
INTRAVENOUS | Status: DC
  Administered 2017-05-14 (×2): via INTRAVENOUS

## 2017-05-14 MED ORDER — METFORMIN HCL ER 500 MG PO TB24
1000.0000 mg | ORAL_TABLET | Freq: Two times a day (BID) | ORAL | Status: DC
  Administered 2017-05-14 – 2017-05-15 (×2): 1000 mg via ORAL
  Filled 2017-05-14 (×2): qty 2

## 2017-05-14 MED ORDER — CEFAZOLIN SODIUM-DEXTROSE 1-4 GM/50ML-% IV SOLN
1.0000 g | Freq: Four times a day (QID) | INTRAVENOUS | Status: AC
  Administered 2017-05-14 – 2017-05-15 (×3): 1 g via INTRAVENOUS
  Filled 2017-05-14 (×3): qty 50

## 2017-05-14 MED ORDER — POLYETHYLENE GLYCOL 3350 17 G PO PACK: 17.0000 g | PACK | Freq: Every day | ORAL | Status: DC | PRN

## 2017-05-14 MED ORDER — MAGNESIUM CITRATE PO SOLN: 1.0000 | Freq: Once | ORAL | Status: DC | PRN

## 2017-05-14 MED ORDER — SENNA 8.6 MG PO TABS
1.0000 | ORAL_TABLET | Freq: Two times a day (BID) | ORAL | Status: DC
  Administered 2017-05-14: 8.6 mg via ORAL
  Filled 2017-05-14: qty 1

## 2017-05-14 MED ORDER — FUROSEMIDE 20 MG PO TABS
20.0000 mg | ORAL_TABLET | Freq: Every day | ORAL | Status: DC
  Filled 2017-05-14: qty 1

## 2017-05-14 MED ORDER — EPHEDRINE SULFATE 50 MG/ML IJ SOLN
INTRAMUSCULAR | Status: DC | PRN
  Administered 2017-05-14: 10 mg via INTRAVENOUS

## 2017-05-14 MED ORDER — FENTANYL CITRATE (PF) 250 MCG/5ML IJ SOLN
INTRAMUSCULAR | Status: AC
  Filled 2017-05-14: qty 5

## 2017-05-14 MED ORDER — OMEGA-3-ACID ETHYL ESTERS 1 G PO CAPS
2.0000 g | ORAL_CAPSULE | Freq: Two times a day (BID) | ORAL | Status: DC
  Filled 2017-05-14 (×2): qty 2

## 2017-05-14 MED ORDER — POTASSIUM CHLORIDE IN NACL 20-0.45 MEQ/L-% IV SOLN
INTRAVENOUS | Status: DC
  Filled 2017-05-14: qty 1000

## 2017-05-14 MED ORDER — DIPHENHYDRAMINE HCL 12.5 MG/5ML PO ELIX: 12.5000 mg | ORAL_SOLUTION | ORAL | Status: DC | PRN

## 2017-05-14 MED ORDER — MENTHOL 3 MG MT LOZG: 1.0000 | LOZENGE | OROMUCOSAL | Status: DC | PRN

## 2017-05-14 MED ORDER — PANTOPRAZOLE SODIUM 40 MG PO TBEC
40.0000 mg | DELAYED_RELEASE_TABLET | Freq: Every day | ORAL | Status: DC
  Administered 2017-05-15: 40 mg via ORAL
  Filled 2017-05-14: qty 1

## 2017-05-14 MED ORDER — OXYCODONE HCL 5 MG PO TABS
10.0000 mg | ORAL_TABLET | ORAL | Status: DC | PRN
  Administered 2017-05-14 – 2017-05-15 (×2): 15 mg via ORAL
  Filled 2017-05-14: qty 3
  Filled 2017-05-14: qty 2
  Filled 2017-05-14: qty 3

## 2017-05-14 MED ORDER — CEFAZOLIN SODIUM-DEXTROSE 1-4 GM/50ML-% IV SOLN
INTRAVENOUS | Status: AC
Start: 2017-05-14 — End: 2017-05-14
  Filled 2017-05-14: qty 50

## 2017-05-14 MED ORDER — FUROSEMIDE 40 MG PO TABS: 40.0000 mg | ORAL_TABLET | Freq: Every day | ORAL | Status: DC | PRN

## 2017-05-14 MED ORDER — PHENYLEPHRINE HCL 10 MG/ML IJ SOLN
INTRAMUSCULAR | Status: DC | PRN
  Administered 2017-05-14 (×3): 80 ug via INTRAVENOUS
  Administered 2017-05-14: 100 ug via INTRAVENOUS
  Administered 2017-05-14 (×2): 80 ug via INTRAVENOUS

## 2017-05-14 MED ORDER — HYDROMORPHONE HCL 1 MG/ML IJ SOLN: 0.5000 mg | INTRAMUSCULAR | Status: DC | PRN

## 2017-05-14 MED ORDER — TIZANIDINE HCL 4 MG PO TABS: 4.0000 mg | ORAL_TABLET | Freq: Two times a day (BID) | ORAL | Status: DC | PRN

## 2017-05-14 MED ORDER — PHENYLEPHRINE 40 MCG/ML (10ML) SYRINGE FOR IV PUSH (FOR BLOOD PRESSURE SUPPORT)
PREFILLED_SYRINGE | INTRAVENOUS | Status: AC
  Filled 2017-05-14: qty 10

## 2017-05-14 MED ORDER — METOCLOPRAMIDE HCL 5 MG/ML IJ SOLN: 5.0000 mg | Freq: Three times a day (TID) | INTRAMUSCULAR | Status: DC | PRN

## 2017-05-14 MED ORDER — LOVASTATIN ER 40 MG PO TB24: 40.0000 mg | ORAL_TABLET | Freq: Every day | ORAL | Status: DC

## 2017-05-14 MED ORDER — DICLOFENAC EPOLAMINE 1.3 % TD PTCH: 1.0000 | MEDICATED_PATCH | Freq: Two times a day (BID) | TRANSDERMAL | Status: DC | PRN

## 2017-05-14 MED ORDER — SUGAMMADEX SODIUM 200 MG/2ML IV SOLN
INTRAVENOUS | Status: AC
  Filled 2017-05-14: qty 2

## 2017-05-14 MED ORDER — MIDAZOLAM HCL 2 MG/2ML IJ SOLN
INTRAMUSCULAR | Status: AC
  Administered 2017-05-14: 2 mg via INTRAVENOUS
  Filled 2017-05-14: qty 2

## 2017-05-14 MED ORDER — SODIUM CHLORIDE 0.9 % IR SOLN
Status: DC | PRN
  Administered 2017-05-14: 1

## 2017-05-14 MED ORDER — FENTANYL CITRATE (PF) 100 MCG/2ML IJ SOLN
INTRAMUSCULAR | Status: DC | PRN
  Administered 2017-05-14 (×5): 50 ug via INTRAVENOUS

## 2017-05-14 MED ORDER — HYDROMORPHONE HCL 2 MG PO TABS: 1.0000 mg | ORAL_TABLET | Freq: Four times a day (QID) | ORAL | 0 refills | Status: DC | PRN

## 2017-05-14 MED ORDER — ONDANSETRON HCL 4 MG/2ML IJ SOLN
INTRAMUSCULAR | Status: DC | PRN
  Administered 2017-05-14: 4 mg via INTRAVENOUS

## 2017-05-14 MED ORDER — KETOROLAC TROMETHAMINE 15 MG/ML IJ SOLN
7.5000 mg | Freq: Four times a day (QID) | INTRAMUSCULAR | Status: DC
  Administered 2017-05-14 – 2017-05-15 (×3): 7.5 mg via INTRAVENOUS
  Filled 2017-05-14 (×3): qty 1

## 2017-05-14 MED ORDER — FENTANYL CITRATE (PF) 100 MCG/2ML IJ SOLN
INTRAMUSCULAR | Status: AC
  Administered 2017-05-14: 50 ug via INTRAVENOUS
  Filled 2017-05-14: qty 2

## 2017-05-14 MED ORDER — PROPOFOL 10 MG/ML IV BOLUS
INTRAVENOUS | Status: DC | PRN
  Administered 2017-05-14: 20 mg via INTRAVENOUS
  Administered 2017-05-14: 150 mg via INTRAVENOUS

## 2017-05-14 MED ORDER — APIXABAN 5 MG PO TABS
5.0000 mg | ORAL_TABLET | Freq: Two times a day (BID) | ORAL | Status: DC
  Filled 2017-05-14: qty 1

## 2017-05-14 MED ORDER — ROPINIROLE HCL 1 MG PO TABS: 0.5000 mg | ORAL_TABLET | Freq: Every day | ORAL | Status: DC | PRN

## 2017-05-14 MED ORDER — ACETAMINOPHEN 325 MG PO TABS: 650.0000 mg | ORAL_TABLET | ORAL | Status: DC | PRN

## 2017-05-14 MED ORDER — ZOLPIDEM TARTRATE 5 MG PO TABS
5.0000 mg | ORAL_TABLET | Freq: Every evening | ORAL | Status: DC | PRN
  Administered 2017-05-14: 5 mg via ORAL
  Filled 2017-05-14: qty 1

## 2017-05-14 MED ORDER — ALBUTEROL SULFATE (2.5 MG/3ML) 0.083% IN NEBU
2.5000 mg | INHALATION_SOLUTION | Freq: Four times a day (QID) | RESPIRATORY_TRACT | Status: DC | PRN
Start: 2017-05-14 — End: 2017-05-15

## 2017-05-14 MED ORDER — GLYCOPYRROLATE 0.2 MG/ML IJ SOLN
INTRAMUSCULAR | Status: DC | PRN
  Administered 2017-05-14: 0.2 mg via INTRAVENOUS

## 2017-05-14 MED ORDER — ONDANSETRON HCL 4 MG/2ML IJ SOLN: 4.0000 mg | Freq: Four times a day (QID) | INTRAMUSCULAR | Status: DC | PRN

## 2017-05-14 MED ORDER — METOCLOPRAMIDE HCL 5 MG PO TABS: 5.0000 mg | ORAL_TABLET | Freq: Three times a day (TID) | ORAL | Status: DC | PRN

## 2017-05-14 MED ORDER — OXYCODONE HCL 5 MG PO TABS
ORAL_TABLET | ORAL | Status: AC
  Filled 2017-05-14: qty 2

## 2017-05-14 MED ORDER — PHENOL 1.4 % MT LIQD: 1.0000 | OROMUCOSAL | Status: DC | PRN

## 2017-05-14 MED ORDER — EPHEDRINE 5 MG/ML INJ
INTRAVENOUS | Status: AC
  Filled 2017-05-14: qty 10

## 2017-05-14 SURGICAL SUPPLY — 67 items
BENZOIN TINCTURE PRP APPL 2/3 (GAUZE/BANDAGES/DRESSINGS) ×3 IMPLANT
BIT DRILL TWIST 2.7 (BIT) ×2 IMPLANT
BIT DRILL TWIST 2.7MM (BIT) ×1
BLADE SAW SAG 73X25 THK (BLADE) ×2
BLADE SAW SGTL 73X25 THK (BLADE) ×1 IMPLANT
BOOTCOVER CLEANROOM LRG (PROTECTIVE WEAR) ×6 IMPLANT
BOWL SMART MIX CTS (DISPOSABLE) IMPLANT
BRUSH FEMORAL CANAL (MISCELLANEOUS) IMPLANT
CAPT SHLDR REVTOTAL 2 ×2 IMPLANT
CAPT SHOULDER REVTOTAL 2 ×1 IMPLANT
CLOSURE STERI-STRIP 1/2X4 (GAUZE/BANDAGES/DRESSINGS) ×1
CLOSURE WOUND 1/2 X4 (GAUZE/BANDAGES/DRESSINGS) ×1
CLSR STERI-STRIP ANTIMIC 1/2X4 (GAUZE/BANDAGES/DRESSINGS) ×2 IMPLANT
COVER BACK TABLE 60X90IN (DRAPES) ×3 IMPLANT
COVER SURGICAL LIGHT HANDLE (MISCELLANEOUS) ×3 IMPLANT
DRAPE C-ARM 42X72 X-RAY (DRAPES) IMPLANT
DRAPE ORTHO SPLIT 77X108 STRL (DRAPES) ×4
DRAPE SURG ORHT 6 SPLT 77X108 (DRAPES) ×2 IMPLANT
DRAPE U-SHAPE 47X51 STRL (DRAPES) ×3 IMPLANT
DRSG MEPILEX BORDER 4X8 (GAUZE/BANDAGES/DRESSINGS) ×3 IMPLANT
DURAPREP 26ML APPLICATOR (WOUND CARE) ×6 IMPLANT
ELECT REM PT RETURN 9FT ADLT (ELECTROSURGICAL) ×3
ELECTRODE REM PT RTRN 9FT ADLT (ELECTROSURGICAL) ×1 IMPLANT
EVACUATOR 1/8 PVC DRAIN (DRAIN) IMPLANT
FACESHIELD WRAPAROUND (MASK) IMPLANT
GLOVE BIOGEL PI IND STRL 8 (GLOVE) ×1 IMPLANT
GLOVE BIOGEL PI INDICATOR 8 (GLOVE) ×2
GLOVE BIOGEL PI ORTHO PRO SZ8 (GLOVE) ×2
GLOVE ORTHO TXT STRL SZ7.5 (GLOVE) ×3 IMPLANT
GLOVE PI ORTHO PRO STRL SZ8 (GLOVE) ×1 IMPLANT
GLOVE SURG ORTHO 8.0 STRL STRW (GLOVE) ×6 IMPLANT
GOWN STRL REUS W/ TWL XL LVL3 (GOWN DISPOSABLE) ×1 IMPLANT
GOWN STRL REUS W/TWL 2XL LVL3 (GOWN DISPOSABLE) ×3 IMPLANT
GOWN STRL REUS W/TWL XL LVL3 (GOWN DISPOSABLE) ×2
HANDPIECE INTERPULSE COAX TIP (DISPOSABLE)
HOOD PEEL AWAY FACE SHEILD DIS (HOOD) ×6 IMPLANT
KIT BASIN OR (CUSTOM PROCEDURE TRAY) ×3 IMPLANT
KIT ROOM TURNOVER OR (KITS) ×3 IMPLANT
MANIFOLD NEPTUNE II (INSTRUMENTS) ×3 IMPLANT
NS IRRIG 1000ML POUR BTL (IV SOLUTION) ×3 IMPLANT
PACK SHOULDER (CUSTOM PROCEDURE TRAY) ×3 IMPLANT
PAD ARMBOARD 7.5X6 YLW CONV (MISCELLANEOUS) ×6 IMPLANT
PIN THREADED REVERSE (PIN) ×3 IMPLANT
SET HNDPC FAN SPRY TIP SCT (DISPOSABLE) IMPLANT
SHOULDER CAPITATED REVTOTAL 2 ×1 IMPLANT
SLING ARM IMMOBILIZER LRG (SOFTGOODS) ×3 IMPLANT
SLING ARM IMMOBILIZER MED (SOFTGOODS) IMPLANT
SMARTMIX MINI TOWER (MISCELLANEOUS)
SPONGE LAP 18X18 X RAY DECT (DISPOSABLE) ×6 IMPLANT
STRIP CLOSURE SKIN 1/2X4 (GAUZE/BANDAGES/DRESSINGS) ×2 IMPLANT
SUCTION FRAZIER HANDLE 10FR (MISCELLANEOUS) ×2
SUCTION TUBE FRAZIER 10FR DISP (MISCELLANEOUS) ×1 IMPLANT
SUPPORT WRAP ARM LG (MISCELLANEOUS) ×3 IMPLANT
SUT ETHIBOND 2 0 SH (SUTURE) ×2
SUT ETHIBOND 2 0 SH 36X2 (SUTURE) ×1 IMPLANT
SUT ETHIBOND 2 OS 4 DA (SUTURE) IMPLANT
SUT ETHIBOND CT1 BRD 2-0 30IN (SUTURE) ×3 IMPLANT
SUT FIBERWIRE #2 38 T-5 BLUE (SUTURE) ×3
SUT VIC AB 2-0 CT1 27 (SUTURE) ×2
SUT VIC AB 2-0 CT1 TAPERPNT 27 (SUTURE) ×1 IMPLANT
SUT VIC AB 3-0 SH 8-18 (SUTURE) ×6 IMPLANT
SUTURE FIBERWR #2 38 T-5 BLUE (SUTURE) ×1 IMPLANT
TOWEL OR 17X24 6PK STRL BLUE (TOWEL DISPOSABLE) ×3 IMPLANT
TOWEL OR 17X26 10 PK STRL BLUE (TOWEL DISPOSABLE) ×3 IMPLANT
TOWER SMARTMIX MINI (MISCELLANEOUS) IMPLANT
TRAY FOLEY W/METER SILVER 16FR (SET/KITS/TRAYS/PACK) IMPLANT
WATER STERILE IRR 1000ML POUR (IV SOLUTION) ×3 IMPLANT

## 2017-05-14 NOTE — Anesthesia Preprocedure Evaluation (Addendum)
Anesthesia Evaluation  Patient identified by MRN, date of birth, ID band Patient awake    Reviewed: Allergy & Precautions, NPO status , Patient's Chart, lab work & pertinent test results  History of Anesthesia Complications (+) PONV  Airway Mallampati: II  TM Distance: >3 FB Neck ROM: Full    Dental no notable dental hx.    Pulmonary former smoker,    Pulmonary exam normal breath sounds clear to auscultation       Cardiovascular hypertension, Pt. on medications + CAD  Normal cardiovascular exam+ dysrhythmias (LBBB) Ventricular Tachycardia + Cardiac Defibrillator  Rhythm:Regular Rate:Normal  EF 50-55%   Neuro/Psych negative neurological ROS  negative psych ROS   GI/Hepatic negative GI ROS, Neg liver ROS,   Endo/Other  diabetes, Type 2  Renal/GU negative Renal ROS  negative genitourinary   Musculoskeletal negative musculoskeletal ROS (+)   Abdominal   Peds negative pediatric ROS (+)  Hematology Factor V Leiden   Anesthesia Other Findings   Reproductive/Obstetrics negative OB ROS                             Anesthesia Physical Anesthesia Plan  ASA: III  Anesthesia Plan: General   Post-op Pain Management:  Regional for Post-op pain   Induction: Intravenous  PONV Risk Score and Plan: 3 and Ondansetron and Treatment may vary due to age or medical condition  Airway Management Planned: Oral ETT  Additional Equipment:   Intra-op Plan:   Post-operative Plan: Extubation in OR  Informed Consent: I have reviewed the patients History and Physical, chart, labs and discussed the procedure including the risks, benefits and alternatives for the proposed anesthesia with the patient or authorized representative who has indicated his/her understanding and acceptance.   Dental advisory given  Plan Discussed with: CRNA  Anesthesia Plan Comments:         Anesthesia Quick  Evaluation

## 2017-05-14 NOTE — Progress Notes (Signed)
Orthopedic Tech Progress Note Patient Details:  Walter [name] [birth year] [id number] Patient already has sling. Patient ID: Walter [name], male   DOB: [birth year], 72 y.o.   MRN: [id number]   [name] 05/14/2017, 3:32 PM

## 2017-05-14 NOTE — Transfer of Care (Signed)
Immediate Anesthesia Transfer of Care Note  Patient: Walter [name]  Procedure(s) Performed: TOTAL REVERSE SHOULDER ARTHROPLASTY (Right Shoulder)  Patient Location: PACU  Anesthesia Type:GA combined with regional for post-op pain  Level of Consciousness: awake, alert , oriented and patient cooperative  Airway & Oxygen Therapy: Patient Spontanous Breathing and Patient connected to nasal cannula oxygen  Post-op Assessment: Report given to RN and Post -op Vital signs reviewed and stable  Post vital signs: Reviewed and stable  Last Vitals:  Vitals:   05/14/17 0906  BP: (!) 160/89  Pulse: 88  Resp: 20  Temp: 36.8 C  SpO2: 95%    Last Pain:  Vitals:   05/14/17 0926  TempSrc:   PainSc: 4       Patients Stated Pain Goal: 4 (84/[birth year]/75 4360)  Complications: No apparent anesthesia complications

## 2017-05-14 NOTE — Op Note (Signed)
05/14/2017  2:55 PM  PATIENT:  Walter [name] DIAGNOSIS:  RIGHT SHOULDER posttraumatic osteoarthrosis, rotator cuff arthropathy  POST-OPERATIVE DIAGNOSIS:  Same  PROCEDURE: Right reverse total shoulder replacement  SURGEON:  Johnny Bridge, MD  PHYSICIAN ASSISTANT: Joya Gaskins, OPA-C, present and scrubbed throughout the case, critical for completion in a timely fashion, and for retraction, instrumentation, and closure.  ANESTHESIA:   General with an interscalene block  ESTIMATED BLOOD LOSS: 200 mL  PREOPERATIVE INDICATIONS:  Walter [name] is a  72 y.o. male with a diagnosis of RIGHT SHOULDER rotator cuff arthropathy who failed conservative measures and elected for surgical management.  He has had a previous Putti-Platt stabilization with some type of subscapularis advancement, and has had severe loss of motion for the last 30 years.  The risks benefits and alternatives were discussed with the patient preoperatively including but not limited to the risks of infection, bleeding, nerve injury, cardiopulmonary complications, the need for revision surgery, dislocation, brachial plexus palsy, incomplete relief of pain, among others, and the patient was willing to proceed.  OPERATIVE IMPLANTS: Biomet size 16 humeral stem press-fit standard with a 44 mm reverse shoulder arthroplasty tray with a standard liner and a 36 mm glenosphere with a mini baseplate and 4 locking screws and one central nonlocking screw.  OPERATIVE FINDINGS: He had substantial hypertrophic osteophyte formation both anteriorly and posteriorly along the inferior neck of the humerus.  This is challenging, and fairly hair raising to extricate.  It was very hard to dislocate his shoulder.  There was abnormal tissue crossing the anterior humerus at the level of the subscapularis, I am suspicious that the subscapularis inferiorly was advanced across the bicipital tuberosity.  Initially, I was concerned that the  crossing tissue appeared to possibly be involving nerve, however I did not see a possible way that there could have been a nerve transferred into that location, and I needed to release the tissue in order to achieve the external rotation necessary to achieve the operation.  Bovie cautery of the tissue did not cause any type of nerve stimulation.  I did however get some nerve stimulation while extricating the posterior osteophytes, which were substantial, and the stimulation was actually down at the hand, indicating that I may have been somewhat close to the brachial plexus branches in the deep axillary region.  I did not cut, or use any type of Bovie in this location, and was able to bluntly extricate the massive osteophytes.  Hopefully his nerve function will be intact.  Additionally, I am sure the axillary nerve was at risk as I had to get extremely low down on the inferior neck in order to extricate the anterior osteophyte.  Again, I made every attempt to bluntly dissect, and where soft tissue release needed to be done I used the Bovie, and did not achieve any axillary nerve stimulation.  The anatomy was all extremely abnormal secondary to his previous operation.  The glenoid was also extremely abnormal, it was massive in its size, but had inferior wear, and had a shelf superiorly.  I ignore the superior bone, positioned the glenoid baseplate into the position that I wanted it for the reconstruction, achieving inferior inclination of the glenoid sphere.  I debated about the exact position of the baseplate, and also whether to go with a larger size glenosphere.  My initial past with a guidepin was slightly on the upslope, not at the depth of the cavity, and I was concerned  that I may have missed the center, and so I placed the guidepin just posterior to that at the apex of the concavity, inferior enough to be able to achieve fixation into the lower bone which is the only bone that was still left to be used.  The  superior bone was basically just a shelf.  After adding the baseplate and the glenoid sphere, I did rongure off of the superior aspect of the glenoid tuberosity in order to minimize risk for superior impingement, which I suspect is very unlikely, but nonetheless I did remove some superior bone.  I did not get aggressive removing the shelves of bone on the posterior glenoid.  I considered building increased lateral offset into the glenoid sphere, but ended up using the standard implant, given his extreme stiffness and long-standing tightness.  I did not feel that instability was going to be the issue.  OPERATIVE PROCEDURE: The patient was brought to the operating room and placed in the supine position. General anesthesia was administered. IV antibiotics were given. Time out was performed. The upper extremity was prepped and draped in usual sterile fashion. The patient was in a beachchair position. Deltopectoral approach was carried out. The biceps was tenodesed to the pectoralis tendon with #2 Fiberwire. The subscapularis was released off of the bone.  I encountered significant anterior crossing tissue, that I suspect was subscapularis advancement, that I had to release in order to achieve external rotation.  I then performed circumferential releases of the humerus, and then dislocated the head, this was extremely difficult, and I had to remove substantial osteophyte with an osteotome anteriorly, and also knock loose the posterior osteophyte before the head would dislocate.  I then reamed with the reamer to the above named size.  I then applied the jig, and cut the humeral head in 30 of retroversion, and then turned my attention to the glenoid.  I used my fingers to fish the posterior osteophyte from the deep recesses posteriorly, and then used a Coker to extricate it from the soft tissue attachments.  Deep retractors were placed, and I resected the labrum, and then placed a guidepin into the center  position on the glenoid, with slight inferior inclination.  My initial past with a guidepin was slightly anterior as the glenoid up, and I replaced the guidepin slightly more posterior in order to be directly at the depth of the cavity and have a neutral anterior to posterior reaming.  I then reamed over the guidepin, and this created a small metaphyseal cancellus blush inferiorly, removing just the cartilage to the subchondral bone superiorly. The base plate was selected and impacted place, and then I secured it centrally with a nonlocking screw, and I had excellent purchase both inferiorly and superiorly. I placed a locking screws on anterior and posterior aspects.  The central screw was only at 20, but the superior screw was a 30, and the inferior screw believe was a 25, and I had good fixation on the front, the back was a 15.  I then turned my attention to the glenosphere, and impacted this into place, placing slight inferior offset (set on B).   The glenoid sphere was completely seated, and had engagement of the [facility] taper. I then turned my attention back to the humerus.  I sequentially broached, and then trialed, and was found to restore soft tissue tension, and it had 2 finger tightness. Therefore the above named components were selected. The shoulder felt stable throughout functional motion.  Before  I placed the real prosthesis I had also placed a total of 3 #2 FiberWire through drill holes in the humerus for later subscapularis repair.  I then impacted the real prosthesis into place, as well as the real humeral tray, and reduced the shoulder. The shoulder had appropriate motion, and was stable, and I irrigated the wounds copiously.    I then used these to repair the subscapularis. This came down to bone.  The inferior quality tissue was less significant, but I did have a decent subscapularis repair.  This was an extremely difficult complicated reconstruction, hopefully he is intact  neurologically, and we will achieve a stable prosthesis.  I then irrigated the shoulder copiously once more, repaired the deltopectoral interval with Vicryl followed by subcutaneous Vicryl with Steri-Strips and sterile gauze for the skin. The patient was awakened and returned back in stable and satisfactory condition. There no complications and He tolerated the procedure well.

## 2017-05-14 NOTE — H&P (Signed)
PREOPERATIVE H&P  Chief Complaint: RIGHT SHOULDER post traumatic osteoarthritis  HPI: Walter [name] is a 72 y.o. male who presents for preoperative history and physical with a diagnosis of RIGHT SHOULDER post traumatic osteoarthritis. Symptoms are rated as moderate to severe, and have been worsening.  This is significantly impairing activities of daily living.  He has elected for surgical management. He has a history of Putti-Platt reconstruction.  He has failed injections, activity modification, anti-inflammatories, and assistive devices.  Preoperative X-rays demonstrate end stage degenerative changes with osteophyte formation, loss of joint space, subchondral sclerosis.   Past Medical History:  Diagnosis Date  . AICD (automatic cardioverter/defibrillator) present   . Anxiety   . Asbestosis ([place])    mild  . Atrial fibrillation ([place])   . Chronic low back pain    /sciatica- Dr Letta Median [facility])  . Complication of anesthesia 1996   previous surgery had a run of v-tach  . Coronary artery disease, non-occlusive June 2011   Cardiac cath in [facility] following abnormal Myoview  . DJD (degenerative joint disease)    /osteoarthritis  . Dyslipidemia, goal LDL below 100    On lovastatin (myalgias with Zocor and Lipitor)  . Dyspnea    worse in hot weather  . Dysrhythmia   . Essential hypertension   . Factor V Leiden ([place])   . Frozen shoulder    right  . GERD (gastroesophageal reflux disease)   . History of kidney stones   . Hypogonadism male   . Irritable bowel   . Kidney stone   . Left bundle branch block (LBBB) on electrocardiogram    Diagnosed close to 20 years ago  . Myocardial infarction ([place])   . Non-ischemic cardiomyopathy [facility]) June 2011   EF 45-50%; suspect related to LBBB; repeat Echo March 2015: EF 50- 55%  . Obesity   . PONV (postoperative nausea and vomiting)   . Restless leg syndrome   . Testicular cancer ([place]) 1972   left  . Tremor,  essential 12/09/2015  . Type 2 diabetes mellitus without complication ([place]) 07/7320   Type 2.    Past Surgical History:  Procedure Laterality Date  . APPENDECTOMY N/A   . BI-VENTRICULAR IMPLANTABLE CARDIOVERTER DEFIBRILLATOR N/A 02/04/2014   SJM Quadra Assura BiV ICD implanted for primary prevention by [name]  . CARDIAC CATHETERIZATION  June 2011   Nonobstructive CAD  . CARDIAC DEFIBRILLATOR PLACEMENT  02/04/14   [facility] model [place] 3365-40Q (serial Number [id number]) biventricular ICD.  Marland Kitchen CARDIOVERSION N/A 10/13/2013   Procedure: CARDIOVERSION;  Surgeon: Larey Dresser, MD;  Location: [place];  Service: Cardiovascular;  Laterality: N/A;  . CARDIOVERSION  10-16-2013   DCCV by [name] following intracardiac echo to rule out LAA thrombus  . CARDIOVERSION N/A 10/16/2013   Procedure: CARDIOVERSION;  Surgeon: Coralyn Mark, MD;  Location: [facility] CATH LAB;  Service: Cardiovascular;  Laterality: N/A;  . CHOLECYSTECTOMY OPEN    . ELECTROPHYSIOLOGY STUDY N/A 10/16/2013   Procedure: ELECTROPHYSIOLOGY STUDY;  Surgeon: Coralyn Mark, MD;  Location: [facility] CATH LAB;  Service: Cardiovascular;  Laterality: N/A;  . ESOPHAGOGASTRODUODENOSCOPY N/A 10/15/2013   Procedure: ESOPHAGOGASTRODUODENOSCOPY (EGD);  Surgeon: Gatha Mayer, MD;  Location[facility] ENDOSCOPY;  Service: Endoscopy;  Laterality: N/A;  bedside  . HERNIA REPAIR    . KNEE ARTHROSCOPY Left   . LEFT AND RIGHT HEART CATHETERIZATION WITH CORONARY ANGIOGRAM N/A 10/12/2013   Procedure: LEFT AND RIGHT HEART CATHETERIZATION WITH CORONARY ANGIOGRAM;  Surgeon: Troy Sine, MD;  Location: [facility] CATH LAB;  Service: Cardiovascular;  Laterality: N/A;  . lymph nodes    . RADIOFREQUENCY ABLATION NERVES     Back every 6 months  . ROTATOR CUFF REPAIR Right   . SURGERY SCROTAL / TESTICULAR Left   . TONSILLECTOMY Bilateral   . TOTAL HIP ARTHROPLASTY Right 02/22/2015   Procedure: RIGHT TOTAL HIP ARTHROPLASTY ANTERIOR APPROACH;  Surgeon: Renette Butters, MD;  Location: [place];  Service: Orthopedics;  Laterality: Right;  . TRANSTHORACIC ECHOCARDIOGRAM  May 2011   EF 45% with diffuse hypokinesis; septal bounce from LBBB  . TRANSTHORACIC ECHOCARDIOGRAM  March 2015   EF 50-55%. Mild concentric LVH. Septal dyssynergy from LBBB    Social History   Socioeconomic History  . Marital status: Married    Spouse name: None  . Number of children: 2  . Years of education: 75  . Highest education level: None  Social Needs  . Financial resource strain: None  . Food insecurity - worry: None  . Food insecurity - inability: None  . Transportation needs - medical: None  . Transportation needs - non-medical: None  Occupational History  . Occupation: Retired  Tobacco Use  . Smoking status: Former Smoker    Types: Cigarettes    Last attempt to quit: 02/10/1973    Years since quitting: 44.2  . Smokeless tobacco: Never Used  Substance and Sexual Activity  . Alcohol use: Yes    Comment: rare  . Drug use: No  . Sexual activity: None    Comment: Married  Other Topics Concern  . None  Social History Narrative   Lives at home w/ his wife, daughter and granddaughter   Left-handed   Caffeine: 1-2 diet sodas per day   Family History  Problem Relation Age of Onset  . Hypertension Mother   . Heart disease Mother   . Heart disease Father   . Hypertension Brother   . Heart attack Maternal Uncle   . Hypertension Brother   . Heart disease Paternal Uncle    Allergies  Allergen Reactions  . Statins Other (See Comments)    Myalgias.  Can only take Altoprev    Prior to Admission medications   Medication Sig Start Date End Date Taking? Authorizing Provider  amiodarone (PACERONE) 200 MG tablet Take 0.5 tablets (100 mg total) by mouth daily. Patient taking differently: Take 100 mg by mouth every evening.  04/11/17  Yes Allred, Jeneen Rinks, MD  carvedilol (COREG) 12.5 MG tablet TAKE 1 TABLET TWICE A DAY 05/01/17  Yes Larey Dresser, MD   cholecalciferol (VITAMIN D) 1000 UNITS tablet Take 1,000 Units by mouth daily.    Yes [provider]  furosemide (LASIX) 20 MG tablet TAKE 1 TABLET DAILY 05/01/17  Yes Larey Dresser, MD  furosemide (LASIX) 40 MG tablet Take 1 tablet (40 mg total) by mouth every other day. Patient taking differently: Take 40 mg by mouth daily as needed (FOR FLUID RETENTION).  08/26/15  Yes Larey Dresser, MD  gabapentin (NEURONTIN) 300 MG capsule Take 300-600 mg by mouth 2 (two) times daily. Take 2 capsules (600 mg) at 1600 & BEDTIME.   Yes [provider]  lisinopril (PRINIVIL,ZESTRIL) 10 MG tablet TAKE 1 TABLET EVERY EVENING 05/01/17  Yes Larey Dresser, MD  lovastatin (ALTOPREV) 40 MG 24 hr tablet Take 40 mg by mouth at bedtime.   Yes [provider]  magnesium oxide (MAG-OX) 400 MG tablet Take 400  mg by mouth daily.    Yes [provider]  metFORMIN (GLUCOPHAGE-XR) 500 MG 24 hr tablet Take 1,000 mg by mouth 2 (two) times daily.   Yes [provider]  omega-3 acid ethyl esters (LOVAZA) 1 G capsule Take 2 g by mouth 2 (two) times daily. Morning & afternoon.   Yes [provider]  oxyCODONE-acetaminophen (PERCOCET) 10-325 MG tablet Take 1 tablet by mouth every 5 (five) hours as needed (FOR PAIN.).  11/24/15  Yes [provider]  pantoprazole (PROTONIX) 40 MG tablet Take 40 mg by mouth daily before breakfast.   Yes [provider]  rOPINIRole (REQUIP) 1 MG tablet Take 1 mg by mouth 2 (two) times daily. 1600 & BEDTIME 11/28/15  Yes [provider]  spironolactone (ALDACTONE) 25 MG tablet TAKE 1 TABLET DAILY Patient taking differently: TAKE 1 TABLET (25 MG) BY MOUTH DAILY IN THE MORNING. 04/27/16  Yes Larey Dresser, MD  testosterone cypionate (DEPO-TESTOSTERONE) 200 MG/ML injection Inject 200 mg into the muscle See admin instructions. EVERY 10 DAYS   Yes [provider]  tiZANidine (ZANAFLEX) 4 MG capsule Take 4 mg by mouth 2  (two) times daily as needed for muscle spasms.    Yes [provider]  vitamin B-12 (CYANOCOBALAMIN) 250 MCG tablet Take 250 mcg by mouth daily.   Yes [provider]  albuterol (PROVENTIL HFA;VENTOLIN HFA) 108 (90 Base) MCG/ACT inhaler Inhale 1-2 puffs into the lungs every 6 (six) hours as needed for wheezing or shortness of breath.    [provider]  diclofenac (FLECTOR) 1.3 % PTCH Place 1 patch onto the skin 2 (two) times daily as needed (pain).     [provider]  ELIQUIS 5 MG TABS tablet TAKE 1 TABLET TWICE A DAY 05/01/17   Larey Dresser, MD  rOPINIRole (REQUIP) 0.5 MG tablet Take 0.5 mg by mouth daily as needed (IN THE EVENING FOR RESTLESS LEG SYNDROME.).     [provider]  sildenafil (VIAGRA) 100 MG tablet Take 100 mg by mouth daily as needed for erectile dysfunction.    [provider]     Positive ROS: All other systems have been reviewed and were otherwise negative with the exception of those mentioned in the HPI and as above.  Physical Exam: General: Alert, no acute distress Cardiovascular: No pedal edema Respiratory: No cyanosis, no use of accessory musculature GI: No organomegaly, abdomen is soft and non-tender Skin: No lesions in the area of chief complaint Neurologic: Sensation intact distally Psychiatric: Patient is competent for consent with normal mood and affect Lymphatic: No axillary or cervical lymphadenopathy  MUSCULOSKELETAL: Right shoulder active motion is extremely limited, 0-40 degrees, external rotation to neutral at best, well-healed previous surgical wound.  Assessment: Posttraumatic right shoulder osteoarthropathy with rotator cuff arthropathy   Plan: Plan for Procedure(s): Reverse total shoulder replacement  The risks benefits and alternatives were discussed with the patient including but not limited to the risks of nonoperative treatment, versus surgical intervention including infection,  bleeding, nerve injury,  blood clots, cardiopulmonary complications, morbidity, mortality, among others, and they were willing to proceed.   Severity of Illness: Inpatient only procedure  Johnny Bridge, MD Cell [phone]   05/14/2017 10:27 AM

## 2017-05-14 NOTE — Plan of Care (Signed)
  Progressing Safety: Ability to remain free from injury will improve 05/14/2017 2102 - Progressing by Charlena Cross, RN Education: Knowledge of the prescribed therapeutic regimen will improve 05/14/2017 2102 - Progressing by Charlena Cross, RN Understanding of activity limitations/precautions following surgery will improve 05/14/2017 2102 - Progressing by Charlena Cross, RN Activity: Ability to tolerate increased activity will improve 05/14/2017 2102 - Progressing by Charlena Cross, RN Pain Management: Pain level will decrease with appropriate interventions 05/14/2017 2102 - Progressing by Charlena Cross, RN

## 2017-05-14 NOTE — Anesthesia Procedure Notes (Signed)
Procedure Name: Intubation Date/Time: 05/14/2017 10:56 AM Performed by: Adalberto Ill, CRNA Pre-anesthesia Checklist: Patient identified, Emergency Drugs available, Suction available and Patient being monitored Patient Re-evaluated:Patient Re-evaluated prior to induction Oxygen Delivery Method: Circle System Utilized Preoxygenation: Pre-oxygenation with 100% oxygen Induction Type: IV induction Ventilation: Mask ventilation without difficulty Laryngoscope Size: Mac and 4 Grade View: Grade I Tube type: Oral Number of attempts: 1 Airway Equipment and Method: Stylet and Oral airway Placement Confirmation: ETT inserted through vocal cords under direct vision,  positive ETCO2 and breath sounds checked- equal and bilateral Secured at: 22 cm Tube secured with: Tape Dental Injury: Teeth and Oropharynx as per pre-operative assessment

## 2017-05-14 NOTE — Discharge Instructions (Signed)
Diet: As you were doing prior to hospitalization   Shower:  May shower but keep the wounds dry, use an occlusive plastic wrap, NO SOAKING IN TUB.  If the bandage gets wet, change with a clean dry gauze.  If you have a splint on, leave the splint in place and keep the splint dry with a plastic bag.  Dressing:  You may change your dressing 3-5 days after surgery, unless you have a splint.  If you have a splint, then just leave the splint in place and we will change your bandages during your first follow-up appointment.    If you had hand or foot surgery, we will plan to remove your stitches in about 2 weeks in the office.  For all other surgeries, there are sticky tapes (steri-strips) on your wounds and all the stitches are absorbable.  Leave the steri-strips in place when changing your dressings, they will peel off with time, usually 2-3 weeks.  Activity:  Increase activity slowly as tolerated, but follow the weight bearing instructions below.  The rules on driving is that you can not be taking narcotics while you drive, and you must feel in control of the vehicle.    Weight Bearing:   Sling at all times, ok for elbow motion with hygiene, ok for finger wrist and hand motion.    To prevent constipation: you may use a stool softener such as -  Colace (over the counter) 100 mg by mouth twice a day  Drink plenty of fluids (prune juice may be helpful) and high fiber foods Miralax (over the counter) for constipation as needed.    Itching:  If you experience itching with your medications, try taking only a single pain pill, or even half a pain pill at a time.  You may take up to 10 pain pills per day, and you can also use benadryl over the counter for itching or also to help with sleep.   Precautions:  If you experience chest pain or shortness of breath - call 911 immediately for transfer to the hospital emergency department!!  If you develop a fever greater that 101 F, purulent drainage from wound,  increased redness or drainage from wound, or calf pain -- Call the office at [phone]                                                Follow- Up Appointment:  Please call for an appointment to be seen in 2 weeks [place] - [phone]

## 2017-05-14 NOTE — Anesthesia Procedure Notes (Addendum)
Anesthesia Regional Block: Supraclavicular block   Pre-Anesthetic Checklist: ,, timeout performed, Correct Patient, Correct Site, Correct Laterality, Correct Procedure, Correct Position, site marked, Risks and benefits discussed,  Surgical consent,  Pre-op evaluation,  At surgeon's request and post-op pain management  Laterality: Right and Upper  Prep: Maximum Sterile Barrier Precautions used, chloraprep       Needles:  Injection technique: Single-shot  Needle Type: Echogenic Stimulator Needle     Needle Length: 10cm      Additional Needles:   Procedures:,,,, ultrasound used (permanent image in chart),,,,  Narrative:  Start time: 05/14/2017 10:14 AM End time: 05/14/2017 10:24 AM Injection made incrementally with aspirations every 5 mL.  Performed by: Personally  Anesthesiologist: Montez Hageman, MD  Additional Notes: Risks, benefits and alternative to block explained extensively.  Patient tolerated procedure well, without complications.

## 2017-05-15 LAB — GLUCOSE, CAPILLARY: GLUCOSE-CAPILLARY: 137 mg/dL — AB (ref 65–99)

## 2017-05-15 LAB — BASIC METABOLIC PANEL
Anion gap: 9 (ref 5–15)
BUN: 24 mg/dL — ABNORMAL HIGH (ref 6–20)
CHLORIDE: 101 mmol/L (ref 101–111)
CO2: 25 mmol/L (ref 22–32)
Calcium: 8.3 mg/dL — ABNORMAL LOW (ref 8.9–10.3)
Creatinine, Ser: 1.53 mg/dL — ABNORMAL HIGH (ref 0.61–1.24)
GFR, EST AFRICAN AMERICAN: 50 mL/min — AB (ref >60)
GFR, EST NON AFRICAN AMERICAN: 43 mL/min — AB (ref >60)
Glucose, Bld: 140 mg/dL — ABNORMAL HIGH (ref 65–99)
POTASSIUM: 4 mmol/L (ref 3.5–5.1)
SODIUM: 135 mmol/L (ref 135–145)

## 2017-05-15 LAB — CBC
HCT: 48.4 % (ref 39.0–52.0)
HEMOGLOBIN: 15.8 g/dL (ref 13.0–17.0)
MCH: 32.1 pg (ref 26.0–34.0)
MCHC: 32.6 g/dL (ref 30.0–36.0)
MCV: 98.4 fL (ref 78.0–100.0)
Platelets: 192 10*3/uL (ref 150–400)
RBC: 4.92 MIL/uL (ref 4.22–5.81)
RDW: 14.5 % (ref 11.5–15.5)
WBC: 8.1 10*3/uL (ref 4.0–10.5)

## 2017-05-15 MED ORDER — HYDROMORPHONE HCL 2 MG PO TABS
2.0000 mg | ORAL_TABLET | ORAL | Status: DC | PRN
  Administered 2017-05-15: 2 mg via ORAL
  Filled 2017-05-15: qty 1

## 2017-05-15 NOTE — Evaluation (Signed)
Occupational Therapy Evaluation Patient Details Name: Walter [name] MRN: [id number] DOB: Aug 30, 1945 Today's Date: 05/15/2017    History of Present Illness Pt is a 72 y/o M s/p reverse R TSA.    Clinical Impression   This 72 y/o M presents with the above. At baseline Pt is independent with ADLs and functional mobility. Pt will return home with spouse who is able to assist with ADLs PRN. Education provided on shoulder protocol, safety and compensatory techniques for completing ADLs while adhering to precautions with pt/pt's spouse verbalizing and return demonstrating understanding throughout. Pt currently requiring Mod-MaxA for UB ADLs secondary to RUE functional limitations, with pt spouse able to safely assist PRN. Questions answered throughout session. Feel Pt is safe to return home and follow up as recommended by MD. No further acute OT needs identified at this time. Will sign off.     Follow Up Recommendations  Follow surgeon's recommendation for DC plan and follow-up therapies;Supervision/Assistance - 24 hour    Equipment Recommendations  None recommended by OT           Precautions / Restrictions Precautions Precautions: Shoulder Shoulder Interventions: Shoulder sling/immobilizer;At all times;Off for dressing/bathing/exercises Precaution Booklet Issued: Yes (comment) Precaution Comments: issued and reviewed with pt/pt spouse  Restrictions Weight Bearing Restrictions: Yes RUE Weight Bearing: Non weight bearing      Mobility Bed Mobility               General bed mobility comments: OOB in recliner   Transfers Overall transfer level: Modified independent Equipment used: None                  Balance Overall balance assessment: No apparent balance deficits (not formally assessed)                                         ADL either performed or assessed with clinical judgement   ADL Overall ADL's : Needs  assistance/impaired Eating/Feeding: Modified independent;Sitting   Grooming: Supervision/safety;Standing   Upper Body Bathing: Minimal assistance;Sitting   Lower Body Bathing: Min guard;Sit to/from stand   Upper Body Dressing : Moderate assistance;Sitting;Standing Upper Body Dressing Details (indicate cue type and reason): donning overhead shirt and sling  Lower Body Dressing: Minimal assistance;Sit to/from stand   Toilet Transfer: Min guard;Ambulation;Regular Museum/gallery exhibitions officer and Hygiene: Min guard;Sit to/from Nurse, children's Details (indicate cue type and reason): educated on use of 3:1 in shower and to complete task from sit<>stand level (vs standing) for increased safety/independence during task completion  Functional mobility during ADLs: Min guard General ADL Comments: education provided on shoulder precautions, safety and compensatory techniques for completing ADLs while adhering to precautions; pt/pt spouse verbalizing and return demonstrating understanding                          Pertinent Vitals/Pain Pain Assessment: Faces Faces Pain Scale: Hurts even more Pain Location: R shoulder  Pain Descriptors / Indicators: Grimacing;Guarding;Sore Pain Intervention(s): Monitored during session;Ice applied     Hand Dominance (pt reports uses both )   Extremity/Trunk Assessment Upper Extremity Assessment Upper Extremity Assessment: RUE deficits/detail RUE Deficits / Details: s/p reverse R TSA RUE Sensation: (minimal decreased sensation (wearing off from block) )   Lower Extremity Assessment Lower Extremity Assessment: Overall WFL for tasks assessed   Cervical / Trunk  Assessment Cervical / Trunk Assessment: Normal   Communication Communication Communication: No difficulties   Cognition Arousal/Alertness: Awake/alert Behavior During Therapy: WFL for tasks assessed/performed Overall Cognitive Status: Within Functional Limits  for tasks assessed                                           Exercises Shoulder Exercises Elbow Flexion: AROM;10 reps;Right;Standing Elbow Extension: AROM;10 reps;Right;Standing Wrist Flexion: AROM;10 reps;Standing;Right Wrist Extension: AROM;10 reps;Standing;Right Digit Composite Flexion: AROM;Right;10 reps;Standing Composite Extension: AROM;Right;10 reps;Standing Neck Flexion: AROM;Seated Neck Extension: AROM;Seated Neck Lateral Flexion - Right: AROM;Seated Neck Lateral Flexion - Left: AROM;Seated Hand Exercises Forearm Supination: AROM;10 reps;Right;Standing Forearm Pronation: AROM;10 reps;Right;Standing   Shoulder Instructions Shoulder Instructions Donning/doffing shirt without moving shoulder: Moderate assistance;Patient able to independently direct caregiver;Caregiver independent with task Method for sponge bathing under operated UE: Min-guard;Caregiver independent with task;Patient able to independently direct caregiver Donning/doffing sling/immobilizer: Caregiver independent with task;Moderate assistance Correct positioning of sling/immobilizer: Moderate assistance;Caregiver independent with task ROM for elbow, wrist and digits of operated UE: Modified independent Sling wearing schedule (on at all times/off for ADL's): Independent Proper positioning of operated UE when showering: Modified independent Positioning of UE while sleeping: Minimal assistance;Caregiver independent with task    Home Living Family/patient expects to be discharged to:: Private residence Living Arrangements: Spouse/significant other Available Help at Discharge: Family;Available 24 hours/day Type of Home: House       Home Layout: Multi-level;Able to live on main level with bedroom/bathroom     Bathroom Shower/Tub: Occupational psychologist: Standard     Home Equipment: Environmental consultant - 2 wheels;Grab bars - tub/shower;Bedside commode;Hand held shower head;Adaptive equipment           Prior Functioning/Environment Level of Independence: Independent                 OT Problem List: Impaired UE functional use;Decreased knowledge of precautions;Decreased range of motion            OT Goals(Current goals can be found in the care plan section) Acute Rehab OT Goals Patient Stated Goal: return home later today  OT Goal Formulation: All assessment and education complete, DC therapy                                 AM-PAC PT "6 Clicks" Daily Activity     Outcome Measure Help from another person eating meals?: None Help from another person taking care of personal grooming?: None Help from another person toileting, which includes using toliet, bedpan, or urinal?: None Help from another person bathing (including washing, rinsing, drying)?: A Little Help from another person to put on and taking off regular upper body clothing?: A Lot Help from another person to put on and taking off regular lower body clothing?: A Little 6 Click Score: 20   End of Session Equipment Utilized During Treatment: Other (comment)(sling ) Nurse Communication: Mobility status  Activity Tolerance: Patient tolerated treatment well Patient left: in chair;with call bell/phone within reach;with family/visitor present  OT Visit Diagnosis: Other (comment)(s/p reverse R TSA )                Time: 1610-9604 OT Time Calculation (min): 29 min Charges:  OT General Charges $OT Visit: 1 Visit OT Evaluation $OT Eval Low Complexity: 1 Low OT Treatments $Self Care/Home Management :  8-22 mins G-Codes:     Lou Cal, OT Pager [phone] 05/15/2017   Walter [name] 05/15/2017, 11:59 AM

## 2017-05-15 NOTE — Anesthesia Postprocedure Evaluation (Signed)
Anesthesia Post Note  Patient: Walter [name]  Procedure(s) Performed: TOTAL REVERSE SHOULDER ARTHROPLASTY (Right Shoulder)     Patient location during evaluation: PACU Anesthesia Type: General Level of consciousness: awake and alert Pain management: pain level controlled Vital Signs Assessment: post-procedure vital signs reviewed and stable Respiratory status: spontaneous breathing, nonlabored ventilation, respiratory function stable and patient connected to nasal cannula oxygen Cardiovascular status: blood pressure returned to baseline and stable Postop Assessment: no apparent nausea or vomiting Anesthetic complications: no    Last Vitals:  Vitals:   05/15/17 0430 05/15/17 0745  BP: 100/65 95/63  Pulse: 96 89  Resp: 20 18  Temp: 37 C 36.7 C  SpO2: 95% 94%    Last Pain:  Vitals:   05/15/17 0940  TempSrc:   PainSc: 5    Pain Goal: Patients Stated Pain Goal: 2 (05/15/17 0845)               Montez Hageman

## 2017-05-15 NOTE — Progress Notes (Signed)
Pt doing well. Pt and wife given D/C instructions with verbal understanding. Rx's were sent to the Pt's pharmacy by MD. Pt's incision is clean and dry with no sign of infection. Pt's IV was removed prior to D/C. Pt D/C'd home via wheelchair @ 1000 per MD order. Pt is stable @ D/C and has no other needs at this time. Holli Humbles, RN

## 2017-05-15 NOTE — Discharge Summary (Signed)
Physician Discharge Summary  Patient ID: Walter [name] MRN: [id number] DOB/AGE: 74-Feb-1947 73 y.o.  Admit date: 05/14/2017 Discharge date: 05/15/2017  Admission Diagnoses:  Rotator cuff arthropathy of right shoulder  Discharge Diagnoses:  Principal Problem:   Rotator cuff arthropathy of right shoulder Active Problems:   Primary localized osteoarthrosis of shoulder   Past Medical History:  Diagnosis Date  . AICD (automatic cardioverter/defibrillator) present   . Anxiety   . Asbestosis ([place])    mild  . Atrial fibrillation ([place])   . Chronic low back pain    /sciatica- Dr Letta Median [facility])  . Complication of anesthesia 1996   previous surgery had a run of v-tach  . Coronary artery disease, non-occlusive June 2011   Cardiac cath in [facility] following abnormal Myoview  . DJD (degenerative joint disease)    /osteoarthritis  . Dyslipidemia, goal LDL below 100    On lovastatin (myalgias with Zocor and Lipitor)  . Dyspnea    worse in hot weather  . Dysrhythmia   . Essential hypertension   . Factor V Leiden ([place])   . Frozen shoulder    right  . GERD (gastroesophageal reflux disease)   . History of kidney stones   . Hypogonadism male   . Irritable bowel   . Kidney stone   . Left bundle branch block (LBBB) on electrocardiogram    Diagnosed close to 20 years ago  . Myocardial infarction ([place])   . Non-ischemic cardiomyopathy [facility]) June 2011   EF 45-50%; suspect related to LBBB; repeat Echo March 2015: EF 50- 55%  . Obesity   . PONV (postoperative nausea and vomiting)   . Restless leg syndrome   . Rotator cuff arthropathy of right shoulder 05/14/2017  . Testicular cancer ([place]) 1972   left  . Tremor, essential 12/09/2015  . Type 2 diabetes mellitus without complication ([place]) 07/8848   Type 2.     Surgeries: Procedure(s): TOTAL REVERSE SHOULDER ARTHROPLASTY on 05/14/2017   Consultants (if any):   Discharged Condition: Improved  Hospital Course:  Walter [name] is an 72 y.o. male who was admitted 05/14/2017 with a diagnosis of Rotator cuff arthropathy of right shoulder and went to the operating room on 05/14/2017 and underwent the above named procedures.    He was given perioperative antibiotics:  Anti-infectives (From admission, onward)   Start     Dose/Rate Route Frequency Ordered Stop   05/14/17 1700  ceFAZolin (ANCEF) IVPB 1 g/50 mL premix     1 g 100 mL/hr over 30 Minutes Intravenous Every 6 hours 05/14/17 1556 05/15/17 0544   05/14/17 0930  ceFAZolin (ANCEF) 3 g in dextrose 5 % 50 mL IVPB     3 g 130 mL/hr over 30 Minutes Intravenous To ShortStay Surgical 05/13/17 1411 05/14/17 1058   05/14/17 0901  ceFAZolin (ANCEF) 1-4 GM/50ML-% IVPB    Comments:  Grace Blight   : cabinet override      05/14/17 0901 05/14/17 2114    .  He was given sequential compression devices, early ambulation, and eliquis for DVT prophylaxis.  He benefited maximally from the hospital stay and there were no complications.  He appeared neurovascularly intact postoperative day 1, all fingers flex extend and abduct, sensation over the axillary nerve distribution, and his deltoid is firing.  Recent vital signs:  Vitals:   05/15/17 0430 05/15/17 0745  BP: 100/65 95/63  Pulse: 96 89  Resp: 20 18  Temp: 98.6 F (37 C) 98 F (  36.7 C)  SpO2: 95% 94%    Recent laboratory studies:  Lab Results  Component Value Date   HGB 15.8 05/15/2017   HGB 19.7 (H) 05/03/2017   HGB 18.0 (H) 01/03/2017   Lab Results  Component Value Date   WBC 8.1 05/15/2017   PLT 192 05/15/2017   Lab Results  Component Value Date   INR 1.04 05/14/2017   Lab Results  Component Value Date   NA 135 05/15/2017   K 4.0 05/15/2017   CL 101 05/15/2017   CO2 25 05/15/2017   BUN 24 (H) 05/15/2017   CREATININE 1.53 (H) 05/15/2017   GLUCOSE 140 (H) 05/15/2017    Discharge Medications:   Allergies as of 05/15/2017      Reactions   Statins Other (See Comments)   Myalgias.   Can only take Altoprev      Medication List    TAKE these medications   albuterol 108 (90 Base) MCG/ACT inhaler Commonly known as:  PROVENTIL HFA;VENTOLIN HFA Inhale 1-2 puffs into the lungs every 6 (six) hours as needed for wheezing or shortness of breath.   amiodarone 200 MG tablet Commonly known as:  PACERONE Take 0.5 tablets (100 mg total) by mouth daily. What changed:  when to take this   carvedilol 12.5 MG tablet Commonly known as:  COREG TAKE 1 TABLET TWICE A DAY   cholecalciferol 1000 units tablet Commonly known as:  VITAMIN D Take 1,000 Units by mouth daily.   DEPO-TESTOSTERONE 200 MG/ML injection Generic drug:  testosterone cypionate Inject 200 mg into the muscle See admin instructions. EVERY 10 DAYS   diclofenac 1.3 % Ptch Commonly known as:  FLECTOR Place 1 patch onto the skin 2 (two) times daily as needed (pain).   ELIQUIS 5 MG Tabs tablet Generic drug:  apixaban TAKE 1 TABLET TWICE A DAY   furosemide 40 MG tablet Commonly known as:  LASIX Take 1 tablet (40 mg total) by mouth every other day. What changed:    when to take this  reasons to take this   furosemide 20 MG tablet Commonly known as:  LASIX TAKE 1 TABLET DAILY What changed:  Another medication with the same name was changed. Make sure you understand how and when to take each.   gabapentin 300 MG capsule Commonly known as:  NEURONTIN Take 300-600 mg by mouth 2 (two) times daily. Take 2 capsules (600 mg) at 1600 & BEDTIME.   HYDROmorphone 2 MG tablet Commonly known as:  DILAUDID Take 0.5 tablets (1 mg total) by mouth every 6 (six) hours as needed for severe pain (breatkthrough pain not controlled by oxycodone).   lisinopril 10 MG tablet Commonly known as:  PRINIVIL,ZESTRIL TAKE 1 TABLET EVERY EVENING   lovastatin 40 MG 24 hr tablet Commonly known as:  ALTOPREV Take 40 mg by mouth at bedtime.   magnesium oxide 400 MG tablet Commonly known as:  MAG-OX Take 400 mg by mouth daily.    metFORMIN 500 MG 24 hr tablet Commonly known as:  GLUCOPHAGE-XR Take 1,000 mg by mouth 2 (two) times daily.   omega-3 acid ethyl esters 1 g capsule Commonly known as:  LOVAZA Take 2 g by mouth 2 (two) times daily. Morning & afternoon.   oxyCODONE-acetaminophen 10-325 MG tablet Commonly known as:  PERCOCET Take 1-2 tablets by mouth every 6 (six) hours as needed (FOR PAIN.). What changed:    how much to take  when to take this   pantoprazole 40 MG tablet Commonly  known as:  PROTONIX Take 40 mg by mouth daily before breakfast.   rOPINIRole 1 MG tablet Commonly known as:  REQUIP Take 1 mg by mouth 2 (two) times daily. 1600 & BEDTIME   rOPINIRole 0.5 MG tablet Commonly known as:  REQUIP Take 0.5 mg by mouth daily as needed (IN THE EVENING FOR RESTLESS LEG SYNDROME.).   sildenafil 100 MG tablet Commonly known as:  VIAGRA Take 100 mg by mouth daily as needed for erectile dysfunction.   spironolactone 25 MG tablet Commonly known as:  ALDACTONE TAKE 1 TABLET DAILY What changed:    how much to take  how to take this  when to take this   tiZANidine 4 MG capsule Commonly known as:  ZANAFLEX Take 4 mg by mouth 2 (two) times daily as needed for muscle spasms.   vitamin B-12 250 MCG tablet Commonly known as:  CYANOCOBALAMIN Take 250 mcg by mouth daily.       Diagnostic Studies: Dg Shoulder Right Port  Result Date: 05/14/2017 CLINICAL DATA:  Post RIGHT shoulder replacement EXAM: PORTABLE RIGHT SHOULDER COMPARISON:  Portable exam 1448 hours compared to CT RIGHT shoulder 03/13/2017 FINDINGS: Single AP view. Reverse RIGHT shoulder arthroplasty. Bones demineralized. No fracture or obvious dislocation on single AP view. Degenerative changes of the RIGHT AC joint. Significant calcified pleural plaque formation in the RIGHT hemithorax. IMPRESSION: RIGHT shoulder prosthesis without acute complication on single AP view. Electronically Signed   By: Lavonia Dana M.D.   On: 05/14/2017  15:12    Disposition: 06-Home-Health Care Svc    Follow-up Information    Marchia Bond, MD. Schedule an appointment as soon as possible for a visit in 2 weeks.   Specialty:  Orthopedic Surgery Contact information: [address] [place] [address] [phone]            Signed: Johnny Bridge 05/15/2017, 8:25 AM

## 2017-05-16 NOTE — OR Nursing (Signed)
LATE ENTRY: Corrected implant documentation and added correct implant; REF #[id number].  Verified with RN circulator signed PO paperwork.

## 2017-05-17 ENCOUNTER — Encounter (HOSPITAL_COMMUNITY): Payer: Self-pay | Admitting: Orthopedic Surgery

## 2017-05-21 ENCOUNTER — Telehealth: Payer: Self-pay | Admitting: Cardiology

## 2017-05-21 NOTE — Telephone Encounter (Signed)
Spoke with pt and reminded pt of remote transmission that is due today. Pt verbalized understanding.   

## 2017-05-23 ENCOUNTER — Encounter (HOSPITAL_COMMUNITY): Payer: Self-pay | Admitting: Orthopedic Surgery

## 2017-05-31 NOTE — Progress Notes (Signed)
No ICM remote transmission received for 05/21/2017 and next ICM transmission scheduled for 06/13/2017.

## 2017-06-13 NOTE — Progress Notes (Signed)
No ICM remote transmission received for 06/13/2017 and next ICM transmission scheduled for 06/21/2017.

## 2017-06-21 ENCOUNTER — Telehealth: Payer: Self-pay

## 2017-06-21 ENCOUNTER — Ambulatory Visit (INDEPENDENT_AMBULATORY_CARE_PROVIDER_SITE_OTHER): Payer: Medicare Other

## 2017-06-21 DIAGNOSIS — I5022 Chronic systolic (congestive) heart failure: Secondary | ICD-10-CM

## 2017-06-21 DIAGNOSIS — Z95810 Presence of automatic (implantable) cardiac defibrillator: Secondary | ICD-10-CM

## 2017-06-21 NOTE — Progress Notes (Signed)
EPIC Encounter for ICM Monitoring  Patient Name: Walter [name] is a 72 y.o. male Date: 06/21/2017 Primary Care Physican: London Pepper, MD Primary Cardiologist:[place] Electrophysiologist: Allred Dry Weight:258 lbs (office weight on 05/14/17) Bi-V Pacing: 99%                 Attempted call to patient and unable to reach.  Left detailed message regarding transmission.  Transmission reviewed.    Thoracic impedance normal but was abnormal suggesting fluid accumulation from 06/08/2017 to 06/15/2017.  Prescribed dosage: Furosemide 40 mg 1 tablet as needed.  Taking differently:  He takes 20 mg every other day and increases if he has fluid symptoms.   Labs: 05/15/2017 Creatinine 1.53, BUN 24, Potassium 4.0, Sodium 135, EGFR >60 05/03/2017 Creatinine 1.08, BUN 16, Potassium 4.3, Sodium 137, EGFR >60 01/03/2017 Creatinine 1.27, BUN 19, Potassium 4.4, Sodium 137 03/28/2016 Creatinine 1.26, BUN 18, Potassium 4.8, Sodium 137 12/08/2017Creatinine 1.40, BUN 31, Potassium 4.3, Sodium 141  06/09/2017Creatinine 1.36, BUN 27, Potassium 4.8, Sodium 133  02/09/2017Creatinine 1.18, BUN 21, Potassium 4.4, Sodium 138  Recommendations: Left voice mail with ICM number and encouraged to call if experiencing any fluid symptoms.  Follow-up plan: ICM clinic phone appointment on 07/22/2017.   Copy of ICM check sent to Dr. Rayann Heman.   3 month ICM trend: 06/19/2017    1 Year ICM trend:       Rosalene Billings, RN 06/21/2017 9:11 AM

## 2017-06-21 NOTE — Telephone Encounter (Signed)
Remote ICM transmission received.  Attempted call to patient and left detailed message per DPR regarding transmission and next ICM scheduled for 07/22/2017.  Advised to return call for any fluid symptoms or questions.    

## 2017-07-22 ENCOUNTER — Ambulatory Visit (INDEPENDENT_AMBULATORY_CARE_PROVIDER_SITE_OTHER): Payer: Medicare Other | Admitting: *Deleted

## 2017-07-22 DIAGNOSIS — Z95810 Presence of automatic (implantable) cardiac defibrillator: Secondary | ICD-10-CM

## 2017-07-22 DIAGNOSIS — I428 Other cardiomyopathies: Secondary | ICD-10-CM | POA: Diagnosis not present

## 2017-07-22 DIAGNOSIS — I5022 Chronic systolic (congestive) heart failure: Secondary | ICD-10-CM

## 2017-07-23 NOTE — Progress Notes (Signed)
Remote ICD transmission.   

## 2017-07-23 NOTE — Progress Notes (Signed)
EPIC Encounter for ICM Monitoring  Patient Name: Walter [name] is a 72 y.o. male Date: 07/23/2017 Primary Care Physican: London Pepper, MD Primary [place] Electrophysiologist: Allred Dry Weight:259lbs (baseline closer to 254 lbs) Bi-V Pacing: >99%       Heart Failure questions reviewed, pt has had some weight gain but has been over longer period of time.    Thoracic impedance abnormal suggesting fluid accumulation since 07/20/2017.  Prescribed dosage: Furosemide40 mg 1 tabletas needed. Taking differently: He takes 20 mg every other day and increases if he has fluid symptoms.   Labs: 05/15/2017 Creatinine 1.53, BUN 24, Potassium 4.0, Sodium 135, EGFR >60 05/03/2017 Creatinine 1.08, BUN 16, Potassium 4.3, Sodium 137, EGFR >60 01/03/2017 Creatinine 1.27, BUN 19, Potassium 4.4, Sodium 137 03/28/2016 Creatinine 1.26, BUN 18, Potassium 4.8, Sodium 137 12/08/2017Creatinine 1.40, BUN 31, Potassium 4.3, Sodium 141  06/09/2017Creatinine 1.36, BUN 27, Potassium 4.8, Sodium 133  02/09/2017Creatinine 1.18, BUN 21, Potassium 4.4, Sodium 138  Recommendations: He took Furosemide 20 mg this morning and will take another 20 mg this evening. Advised to take Furosemide 40 mg x next 2 days and then return to PRN. Advised to restrict salt intake.  Follow-up plan: ICM clinic phone appointment on 07/26/2017 to recheck fluid levels (manual send).  Recall to schedule an appointment with Dr Aundra Dubin for April.  Copy of ICM check sent to Dr. Rayann Heman and Dr. Aundra Dubin.   3 month ICM trend: 07/23/2017    1 Year ICM trend:       Rosalene Billings, RN 07/23/2017 11:23 AM

## 2017-07-24 ENCOUNTER — Encounter: Payer: Self-pay | Admitting: Cardiology

## 2017-07-26 ENCOUNTER — Ambulatory Visit (INDEPENDENT_AMBULATORY_CARE_PROVIDER_SITE_OTHER): Payer: Self-pay

## 2017-07-26 ENCOUNTER — Telehealth: Payer: Self-pay

## 2017-07-26 DIAGNOSIS — I5022 Chronic systolic (congestive) heart failure: Secondary | ICD-10-CM

## 2017-07-26 DIAGNOSIS — Z95810 Presence of automatic (implantable) cardiac defibrillator: Secondary | ICD-10-CM

## 2017-07-26 NOTE — Progress Notes (Signed)
EPIC Encounter for ICM Monitoring  Patient Name: Walter [name] is a 72 y.o. male Date: 07/26/2017 Primary Care Physican: London Pepper, MD Primary [place] Electrophysiologist: Allred Dry Weight:260lbs(baseline closer to 254 lbs) Bi-V Pacing: >99%       Heart Failure questions reviewed, pt has some ankle swelling and weight still up from baseline. Patient unaware of amount of salt in diet and had chinese food last night.  Advised chinese food is very high in salt.  He occasionally uses salt shaker.  Information given 1 tsp salt = 2000 mg.  Encouraged to review food labels for salt content and limit to 2000 mg daily.    Thoracic impedance continues to be abnormal suggesting fluid accumulation after taking 2 days of extra Furosemide.  Prescribed dosage: Furosemide40 mg 1 tabletas needed. Taking differently: He takes 20 mg every other day and increases if he has fluid symptoms.   Labs: 05/15/2017 Creatinine 1.53, BUN 24, Potassium 4.0, Sodium 135, EGFR >60 05/03/2017 Creatinine 1.08, BUN 16, Potassium 4.3, Sodium 137, EGFR >60 01/03/2017 Creatinine 1.27, BUN 19, Potassium 4.4, Sodium 137 03/28/2016 Creatinine 1.26, BUN 18, Potassium 4.8, Sodium 137 12/08/2017Creatinine 1.40, BUN 31, Potassium 4.3, Sodium 141  06/09/2017Creatinine 1.36, BUN 27, Potassium 4.8, Sodium 133  02/09/2017Creatinine 1.18, BUN 21, Potassium 4.4, Sodium 138  Recommendations:  Patient will take 40 mg Furosemide x 2 - 3 days.    Follow-up plan: ICM clinic phone appointment on 07/29/2017 (manual send) to recheck fluid levels.   Advised to call Dr Claris Gladden office to schedule routine office visit.   Copy of ICM check sent to Dr. Rayann Heman and Dr. Aundra Dubin.   3 month ICM trend: 07/26/2017    1 Year ICM trend:       Rosalene Billings, RN 07/26/2017 1:20 PM

## 2017-07-26 NOTE — Telephone Encounter (Signed)
Spoke with pt and reminded pt of remote transmission that is due today. Pt verbalized understanding.   

## 2017-07-29 ENCOUNTER — Ambulatory Visit (INDEPENDENT_AMBULATORY_CARE_PROVIDER_SITE_OTHER): Payer: Self-pay

## 2017-07-29 DIAGNOSIS — Z95810 Presence of automatic (implantable) cardiac defibrillator: Secondary | ICD-10-CM

## 2017-07-29 DIAGNOSIS — I5022 Chronic systolic (congestive) heart failure: Secondary | ICD-10-CM

## 2017-07-30 NOTE — Progress Notes (Signed)
EPIC Encounter for ICM Monitoring  Patient Name: Walter [name] is a 72 y.o. male Date: 07/30/2017 Primary Care Physican: London Pepper, MD Primary [place] Electrophysiologist: Allred Dry Weight:260lbs(baseline closer to 254 lbs) Bi-V Pacing:>99%        Heart Failure questions reviewed, pt asymptomatic   Thoracic impedance returned to normal after taking 2 days of PRN Furosemide.  Prescribed dosage: Furosemide40 mg 1 tabletas needed. Taking differently: He takes 20 mg every other day and increases if he has fluid symptoms.   Labs: 05/15/2017 Creatinine 1.53, BUN 24, Potassium 4.0, Sodium 135, EGFR >60 05/03/2017 Creatinine 1.08, BUN 16, Potassium 4.3, Sodium 137, EGFR >60 01/03/2017 Creatinine 1.27, BUN 19, Potassium 4.4, Sodium 137 03/28/2016 Creatinine 1.26, BUN 18, Potassium 4.8, Sodium 137 12/08/2017Creatinine 1.40, BUN 31, Potassium 4.3, Sodium 141  06/09/2017Creatinine 1.36, BUN 27, Potassium 4.8, Sodium 133  02/09/2017Creatinine 1.18, BUN 21, Potassium 4.4, Sodium 138  Recommendations: No changes.  Reinforced fluid restriction to < 2 L daily and sodium restriction to less than 2000 mg daily.  Encouraged to call for fluid symptoms.  Follow-up plan: ICM clinic phone appointment on 08/22/2017.  Due to make an appointment with Dr Aundra Dubin.  Copy of ICM check sent to Dr. Rayann Heman.   3 month ICM trend: 07/29/2017    1 Year ICM trend:       Rosalene Billings, RN 07/30/2017 12:34 PM

## 2017-08-05 LAB — CUP PACEART REMOTE DEVICE CHECK
Battery Remaining Longevity: 41 mo
Battery Remaining Percentage: 51 %
Brady Statistic AP VP Percent: 2 %
Brady Statistic AS VP Percent: 97 %
Brady Statistic AS VS Percent: 1 %
Date Time Interrogation Session: 20190507134607
HIGH POWER IMPEDANCE MEASURED VALUE: 64 Ohm
HIGH POWER IMPEDANCE MEASURED VALUE: 64 Ohm
Implantable Lead Implant Date: 20151119
Implantable Lead Implant Date: 20151119
Implantable Lead Location: 753858
Lead Channel Impedance Value: 340 Ohm
Lead Channel Pacing Threshold Amplitude: 0.75 V
Lead Channel Pacing Threshold Pulse Width: 0.5 ms
Lead Channel Pacing Threshold Pulse Width: 0.5 ms
Lead Channel Pacing Threshold Pulse Width: 0.5 ms
Lead Channel Sensing Intrinsic Amplitude: 0.9 mV
Lead Channel Setting Pacing Amplitude: 1.5 V
Lead Channel Setting Pacing Amplitude: 2.5 V
Lead Channel Setting Pacing Pulse Width: 0.5 ms
MDC IDC LEAD IMPLANT DT: 20151119
MDC IDC LEAD LOCATION: 753859
MDC IDC LEAD LOCATION: 753860
MDC IDC MSMT BATTERY VOLTAGE: 2.93 V
MDC IDC MSMT LEADCHNL LV IMPEDANCE VALUE: 480 Ohm
MDC IDC MSMT LEADCHNL LV PACING THRESHOLD AMPLITUDE: 0.75 V
MDC IDC MSMT LEADCHNL RA IMPEDANCE VALUE: 380 Ohm
MDC IDC MSMT LEADCHNL RV PACING THRESHOLD AMPLITUDE: 1 V
MDC IDC MSMT LEADCHNL RV SENSING INTR AMPL: 12 mV
MDC IDC PG IMPLANT DT: 20151119
MDC IDC SET LEADCHNL RA PACING AMPLITUDE: 2 V
MDC IDC SET LEADCHNL RV PACING PULSEWIDTH: 0.5 ms
MDC IDC SET LEADCHNL RV SENSING SENSITIVITY: 0.5 mV
MDC IDC STAT BRADY AP VS PERCENT: 1 %
MDC IDC STAT BRADY RA PERCENT PACED: 2.3 %
Pulse Gen Serial Number: 7211486

## 2017-08-22 ENCOUNTER — Ambulatory Visit (INDEPENDENT_AMBULATORY_CARE_PROVIDER_SITE_OTHER): Payer: Medicare Other

## 2017-08-22 DIAGNOSIS — I5022 Chronic systolic (congestive) heart failure: Secondary | ICD-10-CM | POA: Diagnosis not present

## 2017-08-22 DIAGNOSIS — Z95810 Presence of automatic (implantable) cardiac defibrillator: Secondary | ICD-10-CM

## 2017-08-22 NOTE — Progress Notes (Signed)
EPIC Encounter for ICM Monitoring  Patient Name: Walter [name] is a 72 y.o. male Date: 08/22/2017 Primary Care Physican: London Pepper, MD Primary [place] Electrophysiologist: Allred Dry Weight:263lbs(baseline closer to 254 lbs) Bi-V Pacing:>99%       Heart Failure questions reviewed, pt symptomatic with weight gain but he has dropped 3 lbs.  Going on vacation for 10 days.    Thoracic impedance normal but was abnormal suggesting fluid accumulation 08/08/2017 to 08/14/2017 and 08/16/2017 to 08/19/2017.  Prescribed dosage: Furosemide20 mg daily. Taking differently: He takes 20 mg every other day and increases if he has fluid symptoms.   Labs: 05/15/2017 Creatinine 1.53, BUN 24, Potassium 4.0, Sodium 135, EGFR >60 05/03/2017 Creatinine 1.08, BUN 16, Potassium 4.3, Sodium 137, EGFR >60 01/03/2017 Creatinine 1.27, BUN 19, Potassium 4.4, Sodium 137 03/28/2016 Creatinine 1.26, BUN 18, Potassium 4.8, Sodium 137 12/08/2017Creatinine 1.40, BUN 31, Potassium 4.3, Sodium 141  06/09/2017Creatinine 1.36, BUN 27, Potassium 4.8, Sodium 133  02/09/2017Creatinine 1.18, BUN 21, Potassium 4.4, Sodium 138  Recommendations: No changes.  Encouraged to call for fluid symptoms.  Follow-up plan: ICM clinic phone appointment on 09/23/2017.    Copy of ICM check sent to Dr. Rayann Heman.   3 month ICM trend: 08/22/2017    1 Year ICM trend:       Rosalene Billings, RN 08/22/2017 12:44 PM

## 2017-09-23 ENCOUNTER — Ambulatory Visit (INDEPENDENT_AMBULATORY_CARE_PROVIDER_SITE_OTHER): Payer: Medicare Other

## 2017-09-23 ENCOUNTER — Telehealth: Payer: Self-pay | Admitting: Cardiology

## 2017-09-23 DIAGNOSIS — I5022 Chronic systolic (congestive) heart failure: Secondary | ICD-10-CM

## 2017-09-23 DIAGNOSIS — Z95810 Presence of automatic (implantable) cardiac defibrillator: Secondary | ICD-10-CM | POA: Diagnosis not present

## 2017-09-23 NOTE — Progress Notes (Signed)
EPIC Encounter for ICM Monitoring  Patient Name: Walter [name] is a 72 y.o. male Date: 09/23/2017 Primary Care Physican: London Pepper, MD Primary [place] Electrophysiologist: Allred Dry Weight:263lbs Bi-V Pacing:>99%       Spoke with wife.  Heart Failure questions reviewed, pt asymptomatic.  On vacation during decreased impedance.   Thoracic impedance normal.  Prescribed dosage: Furosemide20 mg daily. Taking differently: He takes 20 mg every other day and increases if he has fluid symptoms.   Labs: 05/15/2017 Creatinine 1.53, BUN 24, Potassium 4.0, Sodium 135, EGFR >60 05/03/2017 Creatinine 1.08, BUN 16, Potassium 4.3, Sodium 137, EGFR >60 01/03/2017 Creatinine 1.27, BUN 19, Potassium 4.4, Sodium 137 03/28/2016 Creatinine 1.26, BUN 18, Potassium 4.8, Sodium 137  Recommendations: No changes.  Encouraged to call for fluid symptoms.  Follow-up plan: ICM clinic phone appointment on 10/28/2017.    Copy of ICM check sent to Dr. Rayann Heman.   3 month ICM trend: 09/23/2017    1 Year ICM trend:       Rosalene Billings, RN 09/23/2017 2:53 PM

## 2017-09-23 NOTE — Telephone Encounter (Signed)
Spoke with pt and reminded pt of remote transmission that is due today. Pt verbalized understanding.   

## 2017-09-25 ENCOUNTER — Other Ambulatory Visit (HOSPITAL_COMMUNITY): Payer: Self-pay | Admitting: Cardiology

## 2017-10-04 ENCOUNTER — Other Ambulatory Visit (HOSPITAL_COMMUNITY): Payer: Self-pay | Admitting: Cardiology

## 2017-10-04 DIAGNOSIS — Z95810 Presence of automatic (implantable) cardiac defibrillator: Secondary | ICD-10-CM

## 2017-10-04 DIAGNOSIS — I428 Other cardiomyopathies: Secondary | ICD-10-CM

## 2017-10-04 DIAGNOSIS — I480 Paroxysmal atrial fibrillation: Secondary | ICD-10-CM

## 2017-10-28 ENCOUNTER — Ambulatory Visit (INDEPENDENT_AMBULATORY_CARE_PROVIDER_SITE_OTHER): Payer: Medicare Other | Admitting: *Deleted

## 2017-10-28 ENCOUNTER — Telehealth: Payer: Self-pay | Admitting: Cardiology

## 2017-10-28 ENCOUNTER — Ambulatory Visit (INDEPENDENT_AMBULATORY_CARE_PROVIDER_SITE_OTHER): Payer: Medicare Other

## 2017-10-28 DIAGNOSIS — Z95810 Presence of automatic (implantable) cardiac defibrillator: Secondary | ICD-10-CM | POA: Diagnosis not present

## 2017-10-28 DIAGNOSIS — I428 Other cardiomyopathies: Secondary | ICD-10-CM | POA: Diagnosis not present

## 2017-10-28 DIAGNOSIS — I5022 Chronic systolic (congestive) heart failure: Secondary | ICD-10-CM | POA: Diagnosis not present

## 2017-10-28 NOTE — Progress Notes (Signed)
EPIC Encounter for ICM Monitoring  Patient Name: Walter [name] is a 72 y.o. male Date: 10/28/2017 Primary Care Physican: London Pepper, MD Primary Cardiologist:[place] Electrophysiologist: Allred Dry Weight:262lbs Bi-V Pacing:>99%      Heart Failure questions reviewed, pt symptomatic with in right ankle.   Thoracic impedance normal today but was abnormal suggesting fluid accumulation from 09/23/2017 - 10/01/2017 and 10/14/2017 through 10/27/2017.  Prescribed dosage: Furosemide20 mgdaily.    Labs: 05/15/2017 Creatinine 1.53, BUN 24, Potassium 4.0, Sodium 135, EGFR >60 05/03/2017 Creatinine 1.08, BUN 16, Potassium 4.3, Sodium 137, EGFR >60 01/03/2017 Creatinine 1.27, BUN 19, Potassium 4.4, Sodium 137 03/28/2016 Creatinine 1.26, BUN 18, Potassium 4.8, Sodium 137  Recommendations: No changes.   Encouraged to call for fluid symptoms.  Follow-up plan: ICM clinic phone appointment on 11/04/2017 to recheck fluid levels.    Copy of ICM check sent to Dr. Rayann Heman.   3 month ICM trend: 10/28/2017    1 Year ICM trend:       Rosalene Billings, RN 10/28/2017 4:58 PM

## 2017-10-28 NOTE — Telephone Encounter (Signed)
Spoke with pt and reminded pt of remote transmission that is due today. Pt verbalized understanding.   

## 2017-10-29 ENCOUNTER — Encounter: Payer: Self-pay | Admitting: Cardiology

## 2017-10-29 NOTE — Progress Notes (Signed)
Remote ICD transmission.   

## 2017-11-04 ENCOUNTER — Ambulatory Visit (INDEPENDENT_AMBULATORY_CARE_PROVIDER_SITE_OTHER): Payer: Medicare Other

## 2017-11-04 DIAGNOSIS — Z95810 Presence of automatic (implantable) cardiac defibrillator: Secondary | ICD-10-CM

## 2017-11-04 DIAGNOSIS — I5022 Chronic systolic (congestive) heart failure: Secondary | ICD-10-CM

## 2017-11-05 NOTE — Progress Notes (Signed)
EPIC Encounter for ICM Monitoring  Patient Name: LORREN SPLAWN is a 72 y.o. male Date: 11/05/2017 Primary Care Physican: London Pepper, MD Primary Cardiologist:[place] Electrophysiologist: Allred Dry Weight: 260lbs Bi-V Pacing:>99%       Heart Failure questions reviewed, pt asymptomatic.   Thoracic impedance normal.  Prescribed dosage: Furosemide20 mgdaily.    Labs: 05/15/2017 Creatinine 1.53, BUN 24, Potassium 4.0, Sodium 135, EGFR >60 05/03/2017 Creatinine 1.08, BUN 16, Potassium 4.3, Sodium 137, EGFR >60 01/03/2017 Creatinine 1.27, BUN 19, Potassium 4.4, Sodium 137 03/28/2016 Creatinine 1.26, BUN 18, Potassium 4.8, Sodium 137  Recommendations: No changes.  Encouraged to call for fluid symptoms.  Follow-up plan: ICM clinic phone appointment on 11/28/2017.    Copy of ICM check sent to Dr. Rayann Heman.   3 month ICM trend: 11/05/2017    1 Year ICM trend:       Rosalene Billings, RN 11/05/2017 9:13 AM

## 2017-11-21 LAB — CUP PACEART REMOTE DEVICE CHECK
Battery Remaining Percentage: 48 %
Battery Voltage: 2.92 V
Brady Statistic AP VP Percent: 2.3 %
Brady Statistic AS VP Percent: 97 %
Brady Statistic RA Percent Paced: 2.6 %
HighPow Impedance: 78 Ohm
HighPow Impedance: 78 Ohm
Implantable Lead Implant Date: 20151119
Implantable Lead Location: 753859
Implantable Lead Location: 753860
Lead Channel Impedance Value: 410 Ohm
Lead Channel Impedance Value: 510 Ohm
Lead Channel Pacing Threshold Amplitude: 0.75 V
Lead Channel Sensing Intrinsic Amplitude: 1.2 mV
Lead Channel Sensing Intrinsic Amplitude: 12 mV
Lead Channel Setting Pacing Amplitude: 2 V
Lead Channel Setting Pacing Amplitude: 2.5 V
Lead Channel Setting Pacing Pulse Width: 0.5 ms
Lead Channel Setting Pacing Pulse Width: 0.5 ms
MDC IDC LEAD IMPLANT DT: 20151119
MDC IDC LEAD IMPLANT DT: 20151119
MDC IDC LEAD LOCATION: 753858
MDC IDC MSMT BATTERY REMAINING LONGEVITY: 38 mo
MDC IDC MSMT LEADCHNL LV PACING THRESHOLD AMPLITUDE: 0.75 V
MDC IDC MSMT LEADCHNL LV PACING THRESHOLD PULSEWIDTH: 0.5 ms
MDC IDC MSMT LEADCHNL RA PACING THRESHOLD PULSEWIDTH: 0.5 ms
MDC IDC MSMT LEADCHNL RV IMPEDANCE VALUE: 340 Ohm
MDC IDC MSMT LEADCHNL RV PACING THRESHOLD AMPLITUDE: 1 V
MDC IDC MSMT LEADCHNL RV PACING THRESHOLD PULSEWIDTH: 0.5 ms
MDC IDC PG IMPLANT DT: 20151119
MDC IDC PG SERIAL: 7211486
MDC IDC SESS DTM: 20190812135930
MDC IDC SET LEADCHNL LV PACING AMPLITUDE: 1.5 V
MDC IDC SET LEADCHNL RV SENSING SENSITIVITY: 0.5 mV
MDC IDC STAT BRADY AP VS PERCENT: 1 %
MDC IDC STAT BRADY AS VS PERCENT: 1 %

## 2017-11-28 ENCOUNTER — Telehealth: Payer: Self-pay

## 2017-11-28 ENCOUNTER — Ambulatory Visit (INDEPENDENT_AMBULATORY_CARE_PROVIDER_SITE_OTHER): Payer: Medicare Other

## 2017-11-28 DIAGNOSIS — I5022 Chronic systolic (congestive) heart failure: Secondary | ICD-10-CM | POA: Diagnosis not present

## 2017-11-28 DIAGNOSIS — Z95810 Presence of automatic (implantable) cardiac defibrillator: Secondary | ICD-10-CM

## 2017-11-28 NOTE — Telephone Encounter (Signed)
I spoke with the patient about this. Monthly check with Margarita Grizzle

## 2017-11-29 NOTE — Progress Notes (Signed)
EPIC Encounter for ICM Monitoring  Patient Name: Walter [name] is a 72 y.o. male Date: 11/29/2017 Primary Care Physican: London Pepper, MD Primary Cardiologist:[place] Electrophysiologist: Allred Dry Weight: 260lbs Bi-V Pacing:>99%       Heart Failure questions reviewed, pt asymptomatic.  Advised to review eating habits to determine if he is getting more salt in diet.    Thoracic impedance normal but was abnormal suggesting fluid accumulation from 11/10/2017 - 11/24/2017.  Impedance showing an increase in episodes and lengthening amount of days suggesting fluid accumulation since April 2019.  Prescribed: Furosemide20 mgdaily but does take 40 mg when he feels like he has fluid.   Labs: 05/15/2017 Creatinine 1.53, BUN 24, Potassium 4.0, Sodium 135, EGFR >60 05/03/2017 Creatinine 1.08, BUN 16, Potassium 4.3, Sodium 137, EGFR >60 01/03/2017 Creatinine 1.27, BUN 19, Potassium 4.4, Sodium 137 03/28/2016 Creatinine 1.26, BUN 18, Potassium 4.8, Sodium 137  Recommendations: No changes.  Reinforced fluid restriction to < 2 L daily and sodium restriction to less than 2000 mg daily.  Encouraged to call for fluid symptoms.  Follow-up plan: ICM clinic phone appointment on 12/16/2017 to recheck fluid levels.   Office appointment scheduled 01/21/2018 with Dr. Aundra Dubin.    Copy of ICM check sent to Dr. Rayann Heman.   3 month ICM trend: 11/28/2017    1 Year ICM trend:       Rosalene Billings, RN 11/29/2017 12:25 PM

## 2017-12-16 ENCOUNTER — Ambulatory Visit (INDEPENDENT_AMBULATORY_CARE_PROVIDER_SITE_OTHER): Payer: Medicare Other

## 2017-12-16 ENCOUNTER — Other Ambulatory Visit (HOSPITAL_COMMUNITY): Payer: Self-pay | Admitting: *Deleted

## 2017-12-16 DIAGNOSIS — Z95810 Presence of automatic (implantable) cardiac defibrillator: Secondary | ICD-10-CM

## 2017-12-16 DIAGNOSIS — I428 Other cardiomyopathies: Secondary | ICD-10-CM

## 2017-12-16 DIAGNOSIS — I5022 Chronic systolic (congestive) heart failure: Secondary | ICD-10-CM

## 2017-12-16 DIAGNOSIS — I480 Paroxysmal atrial fibrillation: Secondary | ICD-10-CM

## 2017-12-16 MED ORDER — AMIODARONE HCL 200 MG PO TABS
100.0000 mg | ORAL_TABLET | Freq: Every day | ORAL | 3 refills | Status: DC
Start: 2017-12-16 — End: 2019-03-10

## 2017-12-17 NOTE — Progress Notes (Signed)
EPIC Encounter for ICM Monitoring  Patient Name: Walter [name] is a 72 y.o. male Date: 12/17/2017 Primary Care Physican: London Pepper, MD Primary Cardiologist:[place] Electrophysiologist: Allred Dry Weight: 188CZY Bi-V Pacing:>99%      Heart Failure questions reviewed, pt symptomatic with weight gain of 8 lbs in the last 10 days and one leg is swollen twice the size of other leg.  Wears compression stockings  He has been taking Furosemide 20 mg 1 tablet daily for past 2 weeks.  Prior to the 2 weeks he was taking Furosemide 20 mg every other day event though the prescription says daily.       Thoracic impedance slightly below baseline 9/30 suggesting the start of fluid.  Prescribed: Furosemide20 mgTake 1 tablet (20 mg total) by mouth daily.     Labs: 05/15/2017 Creatinine 1.53, BUN 24, Potassium 4.0, Sodium 135, EGFR >60 05/03/2017 Creatinine 1.08, BUN 16, Potassium 4.3, Sodium 137, EGFR >60 01/03/2017 Creatinine 1.27, BUN 19, Potassium 4.4, Sodium 137 03/28/2016 Creatinine 1.26, BUN 18, Potassium 4.8, Sodium 137  Recommendations:   Advised will send to Dr Aundra Dubin for review and recommendations.  Follow-up plan: ICM clinic phone appointment on 12/26/2017.    Office appointment scheduled 01/21/2018 with Dr Aundra Dubin.   Copy of ICM check sent to Dr. Rayann Heman.   3 month ICM trend: 12/16/2017    1 Year ICM trend:       Rosalene Billings, RN 12/17/2017 12:55 PM

## 2017-12-19 NOTE — Progress Notes (Signed)
Attempted call to patient to advise of Dr Claris Gladden recommendation and left voice mail message to return call.  HF clinic will contact patient to schedule office visit.

## 2017-12-19 NOTE — Progress Notes (Signed)
Received call back from patient.  Advised of Dr Claris Gladden recommendation to take Furosemide 40 mg x 1 day then return to 20 mg daily.  Advised Dr Claris Gladden office will call him to schedule an appointment as soon as he has an opening.  He has lost 3.5 pounds since last ICM call and feeling a little better.

## 2017-12-19 NOTE — Progress Notes (Signed)
Can take Lasix 40 mg x 1 then 20 mg daily after that.  Get followup arranged.

## 2017-12-20 NOTE — Progress Notes (Signed)
Per Dr Aundra Dubin ok to keep appt as scheduled on 01/21/18

## 2017-12-26 ENCOUNTER — Ambulatory Visit (INDEPENDENT_AMBULATORY_CARE_PROVIDER_SITE_OTHER): Payer: Medicare Other

## 2017-12-26 ENCOUNTER — Telehealth: Payer: Self-pay

## 2017-12-26 DIAGNOSIS — I5022 Chronic systolic (congestive) heart failure: Secondary | ICD-10-CM

## 2017-12-26 DIAGNOSIS — Z95810 Presence of automatic (implantable) cardiac defibrillator: Secondary | ICD-10-CM

## 2017-12-26 NOTE — Telephone Encounter (Signed)
LMOVM reminding pt to send remote transmission.   

## 2017-12-27 NOTE — Progress Notes (Signed)
EPIC Encounter for ICM Monitoring  Patient Name: Walter [name] is a 72 y.o. male Date: 12/27/2017 Primary Care Physican: London Pepper, MD Primary Cardiologist:[place]Lean Electrophysiologist: Allred Dry Weight:263lbs Bi-V Pacing:>99%         Heart Failure questions reviewed, pt weight loss of 5 lbs and leg swelling has improved.  He played golf today since he has been feeling better.  He will be out of town 10/14 - 10/18.    Thoracic impedance abnormal since last remote transmission on 12/16/17 suggesting fluid accumulation starting 12/22/2017 but trending toward baseline.   Prescribed: Furosemide20 mgTake 1 tablet (20 mg total) by mouth daily.     Labs: 05/15/2017 Creatinine 1.53, BUN 24, Potassium 4.0, Sodium 135, eGFR >60 05/03/2017 Creatinine 1.08, BUN 16, Potassium 4.3, Sodium 137, eGFR >60 01/03/2017 Creatinine 1.27, BUN 19, Potassium 4.4, Sodium 137 03/28/2016 Creatinine 1.26, BUN 18, Potassium 4.8, Sodium 137  Recommendations: Copy of ICM check sent to Dr. Rayann Heman and Dr Aundra Dubin.   Follow-up plan: ICM clinic phone appointment on 01/07/2018 to recheck fluid levels.   Office appointment scheduled 01/21/2018 with Dr. Aundra Dubin.     3 month ICM trend: 12/26/2017    1 Year ICM trend:       Rosalene Billings, RN 12/27/2017 8:49 AM

## 2018-01-07 ENCOUNTER — Ambulatory Visit (INDEPENDENT_AMBULATORY_CARE_PROVIDER_SITE_OTHER): Payer: Medicare Other

## 2018-01-07 ENCOUNTER — Telehealth: Payer: Self-pay

## 2018-01-07 DIAGNOSIS — I5022 Chronic systolic (congestive) heart failure: Secondary | ICD-10-CM | POA: Diagnosis not present

## 2018-01-07 DIAGNOSIS — Z95810 Presence of automatic (implantable) cardiac defibrillator: Secondary | ICD-10-CM | POA: Diagnosis not present

## 2018-01-07 NOTE — Telephone Encounter (Signed)
Remote ICM transmission received.  Attempted call to patient regarding ICM remote transmission and left message to return call   

## 2018-01-07 NOTE — Progress Notes (Signed)
EPIC Encounter for ICM Monitoring  Patient Name: Walter [name] is a 72 y.o. male Date: 01/07/2018 Primary Care Physican: London Pepper, MD Primary Cardiologist:McLean Electrophysiologist: Allred Dry Weight: Previous report263lbs Bi-V Pacing:>99%       Attempted call to patient and unable to reach.  Left message to return call.  Transmission reviewed.    Thoracic impedance normal.   Prescribed: Furosemide20 mgTake 1 tablet (20 mg total) by mouthdaily.  Labs: 05/15/2017 Creatinine 1.53, BUN 24, Potassium 4.0, Sodium 135, eGFR >60 05/03/2017 Creatinine 1.08, BUN 16, Potassium 4.3, Sodium 137, eGFR >60 01/03/2017 Creatinine 1.27, BUN 19, Potassium 4.4, Sodium 137 03/28/2016 Creatinine 1.26, BUN 18, Potassium 4.8, Sodium 137  Recommendations: Unable to reach. Follow-up plan: ICM clinic phone appointment on 02/10/2018.   Office appointment scheduled 01/21/2018 with Dr. Aundra Dubin.    Copy of ICM check sent to Dr. Rayann Heman.   3 month ICM trend: 01/06/2018    1 Year ICM trend:       Rosalene Billings, RN 01/07/2018 11:47 AM

## 2018-01-07 NOTE — Progress Notes (Signed)
Patient was in [place] 10/14-10/19. He reported he did not take any Lasix and not drink as much fluid while he was [place].  He says he usually drinks a lot of fluids here.  Swelling of feet resolved while he was in [place] and he did not have any trouble with fluid at all.  He is going to do a comparison of fluid amount while he was on vacation versus being here at home.  He is feeling fine at this time.

## 2018-01-21 ENCOUNTER — Encounter (HOSPITAL_COMMUNITY): Payer: Self-pay | Admitting: Cardiology

## 2018-01-21 ENCOUNTER — Ambulatory Visit (HOSPITAL_COMMUNITY)
Admission: RE | Admit: 2018-01-21 | Discharge: 2018-01-21 | Disposition: A | Discharge status: Home / Self Care | Payer: Medicare Other | Source: Ambulatory Visit | Attending: Cardiology | Admitting: Cardiology

## 2018-01-21 VITALS — BP 136/68 | HR 67 | Wt 264.4 lb

## 2018-01-21 DIAGNOSIS — E119 Type 2 diabetes mellitus without complications: Secondary | ICD-10-CM | POA: Insufficient documentation

## 2018-01-21 DIAGNOSIS — K219 Gastro-esophageal reflux disease without esophagitis: Secondary | ICD-10-CM | POA: Insufficient documentation

## 2018-01-21 DIAGNOSIS — Z8547 Personal history of malignant neoplasm of testis: Secondary | ICD-10-CM | POA: Diagnosis not present

## 2018-01-21 DIAGNOSIS — I519 Heart disease, unspecified: Secondary | ICD-10-CM

## 2018-01-21 DIAGNOSIS — I10 Essential (primary) hypertension: Secondary | ICD-10-CM | POA: Insufficient documentation

## 2018-01-21 DIAGNOSIS — I428 Other cardiomyopathies: Secondary | ICD-10-CM | POA: Diagnosis not present

## 2018-01-21 DIAGNOSIS — Z7901 Long term (current) use of anticoagulants: Secondary | ICD-10-CM | POA: Diagnosis not present

## 2018-01-21 DIAGNOSIS — I2510 Atherosclerotic heart disease of native coronary artery without angina pectoris: Secondary | ICD-10-CM | POA: Diagnosis not present

## 2018-01-21 DIAGNOSIS — Z79899 Other long term (current) drug therapy: Secondary | ICD-10-CM | POA: Diagnosis not present

## 2018-01-21 DIAGNOSIS — G2581 Restless legs syndrome: Secondary | ICD-10-CM | POA: Insufficient documentation

## 2018-01-21 DIAGNOSIS — Z7984 Long term (current) use of oral hypoglycemic drugs: Secondary | ICD-10-CM | POA: Insufficient documentation

## 2018-01-21 DIAGNOSIS — I480 Paroxysmal atrial fibrillation: Secondary | ICD-10-CM | POA: Insufficient documentation

## 2018-01-21 DIAGNOSIS — E785 Hyperlipidemia, unspecified: Secondary | ICD-10-CM | POA: Insufficient documentation

## 2018-01-21 LAB — COMPREHENSIVE METABOLIC PANEL
ALBUMIN: 3.8 g/dL (ref 3.5–5.0)
ALK PHOS: 54 U/L (ref 38–126)
ALT: 27 U/L (ref 0–44)
ANION GAP: 6 (ref 5–15)
AST: 29 U/L (ref 15–41)
BILIRUBIN TOTAL: 1 mg/dL (ref 0.3–1.2)
BUN: 16 mg/dL (ref 8–23)
CO2: 28 mmol/L (ref 22–32)
Calcium: 8.8 mg/dL — ABNORMAL LOW (ref 8.9–10.3)
Chloride: 104 mmol/L (ref 98–111)
Creatinine, Ser: 1.13 mg/dL (ref 0.61–1.24)
GFR calc Af Amer: >60 mL/min (ref >60)
GFR calc non Af Amer: >60 mL/min (ref >60)
GLUCOSE: 105 mg/dL — AB (ref 70–99)
Potassium: 4 mmol/L (ref 3.5–5.1)
Sodium: 138 mmol/L (ref 135–145)
TOTAL PROTEIN: 6 g/dL — AB (ref 6.5–8.1)

## 2018-01-21 LAB — CBC
HCT: 55.3 % — ABNORMAL HIGH (ref 39.0–52.0)
HEMOGLOBIN: 17.5 g/dL — AB (ref 13.0–17.0)
MCH: 30.8 pg (ref 26.0–34.0)
MCHC: 31.6 g/dL (ref 30.0–36.0)
MCV: 97.4 fL (ref 80.0–100.0)
Platelets: 182 10*3/uL (ref 150–400)
RBC: 5.68 MIL/uL (ref 4.22–5.81)
RDW: 14.1 % (ref 11.5–15.5)
WBC: 6 10*3/uL (ref 4.0–10.5)

## 2018-01-21 LAB — LIPID PANEL
CHOL/HDL RATIO: 3.7 ratio
Cholesterol: 126 mg/dL (ref 0–200)
HDL: 34 mg/dL — ABNORMAL LOW (ref >40)
LDL CALC: 58 mg/dL (ref 0–99)
Triglycerides: 171 mg/dL — ABNORMAL HIGH (ref <150)
VLDL: 34 mg/dL (ref 0–40)

## 2018-01-21 LAB — TSH: TSH: 4.549 u[IU]/mL — ABNORMAL HIGH (ref 0.350–4.500)

## 2018-01-21 MED ORDER — APIXABAN 5 MG PO TABS: 5.0000 mg | ORAL_TABLET | Freq: Two times a day (BID) | ORAL | 3 refills | Status: DC

## 2018-01-21 MED ORDER — SPIRONOLACTONE 25 MG PO TABS: ORAL_TABLET | ORAL | 3 refills | Status: DC

## 2018-01-21 MED ORDER — FUROSEMIDE 20 MG PO TABS: 20.0000 mg | ORAL_TABLET | Freq: Every day | ORAL | 2 refills | Status: DC

## 2018-01-21 MED ORDER — CARVEDILOL 12.5 MG PO TABS: 12.5000 mg | ORAL_TABLET | Freq: Two times a day (BID) | ORAL | 3 refills | Status: DC

## 2018-01-21 MED ORDER — LISINOPRIL 10 MG PO TABS: 10.0000 mg | ORAL_TABLET | Freq: Every evening | ORAL | 3 refills | Status: DC

## 2018-01-21 NOTE — Patient Instructions (Addendum)
Labs done today  Refilled eliquis, carvedilol. Lisinopril, and spironolactone, and furosemide  DECREASE furosemide to 20 mg (1 tab) daily  Your physician has requested that you have an echocardiogram. Echocardiography is a painless test that uses sound waves to create images of your heart. It provides your doctor with information about the size and shape of your heart and how well your heart's chambers and valves are working. This procedure takes approximately one hour. There are no restrictions for this procedure. [facility] on [facility]. will contact you to schedule your appointment.  Follow up with Dr. Aundra Dubin in 6 months

## 2018-01-21 NOTE — Progress Notes (Signed)
Patient ID: Walter [name], male   DOB: [birth year], 72 y.o.   MRN: [id number] PCP: Dr. Orland Mustard Cardiology: Dr. Aundra Dubin  72 y.o. with history of paroxysmal atrial fibrillation, chronic LBBB and cardiomyopathy presents for followup of CHF and atrial fibrillation.  He was found to have LBBB 15-20 years ago.  In 2011, he had a stress test in [facility] that was abnormal so he was taken for [facility] in 6/11 that showed nonobstructive disease.  He had an echo in 5/11 that showed EF 45-50%, diffuse hypokinesis suggesting a mild nonischemic cardiomyopathy.   He has admitted in 7/15 with atrial fibrillation with RVR.  Rate was very difficult to control.  TEE failed due to inability to pass probe x 2.  Eventually, ICE was done to rule out LAA thrombus and he was cardioverted to NSR.  He is on amiodarone now and in NSR.  Echo in 7/15 showed EF 20% with diffuse hypokinesis in setting of atrial fibrillation/RVR.  LHC was also done in 7/15 with minimal coronary disease.  Repeat echo in 10/15 showed EF 20-25%.  In 11/15, he had St Jude CRT-D device placed.  Repeat echo in 4/16 showed EF improved to 50% with mild LVH. Repeat echo in 11/17 showed EF 50-55%, mildly dilated RV with normal systolic function.   He is doing well today.  Weight is up about 6 lbs.  He had a shoulder replacement in 2/19 and says that his weight went up when he was sedentary post-surgery.  He denies exertional dyspnea or chest pain.  No orthopnea/PND.  No lightheadedness.  He has chronic lower extremity edema that waxes and wanes. He sometimes wears compression stockings.   St Jude device interrogation: Stable thoracic impedance, >99% BiV pacing, no AF or VT.   Labs (11/14): K 3.9, creatinine 1.14, LDL 104, LFTs normal Labs (2/15): HCT 56.3 Labs (9/15): K 4.1, creatinine 1.1, digoxin 0.9, TSH normal, LFTs normal Labs (10/15): TGs 262, LDL 82, BNP 68 Labs (11/15): K 4.1, creatinine 1.12 Labs (12/15): K 4.8, creatinine 1.02 Labs (3/16): K 4.6,  creatinine 1.06, digoxin 0.8, LFTs normal, TSH normal Labs (11/16): K 4.2, creatinine 1.17 => 1.3, HCT 56.2, TSH normal, LDL 75, HDL 34, LFTs normal Labs (2/17): K 4.4, creatinine 1.18, BNP 20, HCT 50.8, LFTs normal Labs (12/17): K 4.3, creatinine 1.4 Labs (1/18): K 4.8, creatinine 1.26, hgb 18.1, LFTs normal, TSH normal.  Labs (2/19): K 4, creatinine 1.5  ECG (personally reviewed): NSR, BiV paced  PMH: 1. Type II diabetes 2. HTN 3. Adrenal nodule 4. Restless leg syndrome 5. OA with right hip pain, right frozen shoulder, low back pain. S/p R THR in 12/16.  S/p right shoulder replacement in 2/19.  6. Testicular cancer: 1972 7. Chronic LBBB: Had Cardiolite in 2011 that was abnormal so had LHC in 6/11 in [place], [place].  This showed 30% LCx stenosis and 40% ostial RCA stenosis.  LHC in 7/15 in [place] showed minimal coronary disease.  8. 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  Echo (5/11) with EF 45-50%, moderate LVH.  Echo (7/15) with EF 20%, diffuse hypokinesis, mild to moderately decreased RV systolic function (in setting of afib with RVR). LHC (7/15) with minimal coronary disease.  Echo (10/15) with EF 20-25%, mild LVH, moderate LAE. St Jude CRT-D device placed in 11/15. Echo (4/16) with EF 50%, mild LVH.  - Echo (11/17): EF 50-55%, mildly dilated RV with normal systolic function.  9. Factor V Leiden + 10. Asbestosis:  Mild.  Exposure while in the [employer].  11. GERD 12. PVCs 13. Polycythemia: Uncertain etiology 14. Hyperlipidemia: Myalgias with Zocor and Lipitor.  15. Atrial fibrillation: Paroxysmal.  Unable to pass TEE probe.   SH: Retired from [employer], lives with wife in [place].  2 daughters.  Occasional ETOH.  Quit smoking > 40 years ago.   FH: No premature CAD  ROS: All systems reviewed and negative except as per HPI.    Current Outpatient Medications  Medication Sig Dispense Refill  .  albuterol (PROVENTIL HFA;VENTOLIN HFA) 108 (90 Base) MCG/ACT inhaler Inhale 1-2 puffs into the lungs every 6 (six) hours as needed for wheezing or shortness of breath.    Marland Kitchen amiodarone (PACERONE) 200 MG tablet Take 0.5 tablets (100 mg total) by mouth daily. 45 tablet 3  . apixaban (ELIQUIS) 5 MG TABS tablet Take 1 tablet (5 mg total) by mouth 2 (two) times daily. 180 tablet 3  . carvedilol (COREG) 12.5 MG tablet Take 1 tablet (12.5 mg total) by mouth 2 (two) times daily. 180 tablet 3  . cholecalciferol (VITAMIN D) 1000 UNITS tablet Take 1,000 Units by mouth daily.     . furosemide (LASIX) 20 MG tablet Take 1 tablet (20 mg total) by mouth daily. 90 tablet 2  . gabapentin (NEURONTIN) 300 MG capsule Take 300-600 mg by mouth 2 (two) times daily. Take 2 capsules (600 mg) at 1600 & BEDTIME.    Marland Kitchen lisinopril (PRINIVIL,ZESTRIL) 10 MG tablet Take 1 tablet (10 mg total) by mouth every evening. 90 tablet 3  . lovastatin (ALTOPREV) 40 MG 24 hr tablet Take 40 mg by mouth at bedtime.    . magnesium oxide (MAG-OX) 400 MG tablet Take 400 mg by mouth daily.     . metFORMIN (GLUCOPHAGE-XR) 500 MG 24 hr tablet Take 1,000 mg by mouth 2 (two) times daily.    Marland Kitchen omega-3 acid ethyl esters (LOVAZA) 1 G capsule Take 2 g by mouth 2 (two) times daily. Morning & afternoon.    Marland Kitchen oxyCODONE-acetaminophen (PERCOCET) 10-325 MG tablet Take 1-2 tablets by mouth every 6 (six) hours as needed (FOR PAIN.). 30 tablet 0  . pantoprazole (PROTONIX) 40 MG tablet Take 40 mg by mouth daily before breakfast.    . rOPINIRole (REQUIP) 1 MG tablet Take 1 mg by mouth 2 (two) times daily. 1600 & BEDTIME    . sildenafil (VIAGRA) 100 MG tablet Take 100 mg by mouth daily as needed for erectile dysfunction.    Marland Kitchen spironolactone (ALDACTONE) 25 MG tablet TAKE 1 TABLET (25 MG) BY MOUTH DAILY IN THE MORNING. 90 tablet 3  . testosterone cypionate (DEPO-TESTOSTERONE) 200 MG/ML injection Inject 200 mg into the muscle See admin instructions. EVERY 10 DAYS    .  tiZANidine (ZANAFLEX) 4 MG capsule Take 4 mg by mouth 2 (two) times daily as needed for muscle spasms.     . vitamin B-12 (CYANOCOBALAMIN) 250 MCG tablet Take 250 mcg by mouth daily.     No current facility-administered medications for this encounter.    BP 136/68   Pulse 67   Wt 119.9 kg (264 lb 6.4 oz)   SpO2 97%   BMI 35.86 kg/m  General: NAD Neck: No JVD, no thyromegaly or thyroid nodule.  Lungs: Clear to auscultation bilaterally with normal respiratory effort. CV: Nondisplaced PMI.  Heart regular S1/S2, no S3/S4, no murmur.  1+ edema 1/2 up lower legs bilaterally.  No carotid bruit.  Normal pedal pulses.  Abdomen: Soft, nontender,  no hepatosplenomegaly, no distention.  Skin: Intact without lesions or rashes.  Neurologic: Alert and oriented x 3.  Psych: Normal affect. Extremities: No clubbing or cyanosis.  HEENT: Normal.   Assessment/Plan: 1. Cardiomyopathy: Nonischemic.  There was a pre-existing possible LBBB cardiomyopathy, but EF was down to 20% in the setting of atrial fibrillation with RVR in 7/15 (suggesting tachycardia-mediated cardiomyopathy).  However, EF did not improve in NSR (20-25% on repeat echo in 10/15).  Now with St Jude CRT-D device.  No significant coronary disease on 7/15 LHC.  Since placement of CRT-D, EF has improved to 50-55% (11/17 echo).  NYHA class I-II symptoms.  No JVD or volume overload by Corevue, so suspect peripheral edema is mainly due to venous insufficiency.    - Continue current Coreg, spironolactone, and lisinopril.  BMET today.  - He will continue to take Lasix 20 mg daily.  - I will arrange for echo to reassess LV systolic function.    2. CAD: Nonobstructive, mild.  Continue statin.  No aspirin as he is anticoagulated.  3. Hyperlipidemia: Continue lovastatin.  Myalgias with other statins.   - Check lipids today.  4. HTN: BP is controlled. 5. Atrial fibrillation: Paroxysmal, now in NSR on amiodarone 100 mg daily => dose decreased due to tremor  which is now improved.  Needs to maintain NSR as possible component of tachy-mediated cardiomyopathy.   - Continue Eliquis, will check CBC today.  - Given amiodarone use, check LFTs/TSH today. Will need yearly eye exam.   Followup in 6 months.   Loralie Champagne 01/21/2018

## 2018-01-30 ENCOUNTER — Telehealth (HOSPITAL_COMMUNITY): Payer: Self-pay

## 2018-01-30 NOTE — Telephone Encounter (Signed)
Pt called with no answer voice mail left for pt to call back  

## 2018-02-03 ENCOUNTER — Telehealth (HOSPITAL_COMMUNITY): Payer: Self-pay

## 2018-02-03 DIAGNOSIS — E785 Hyperlipidemia, unspecified: Secondary | ICD-10-CM

## 2018-02-03 NOTE — Telephone Encounter (Signed)
Labs ordered.

## 2018-02-03 NOTE — Telephone Encounter (Signed)
Pt called given results  repeat labs ordered..TSH mildly elevated.  Repeat TSH and send free T3 and free T4.

## 2018-02-06 ENCOUNTER — Other Ambulatory Visit (HOSPITAL_COMMUNITY): Payer: Medicare Other

## 2018-02-10 ENCOUNTER — Telehealth: Payer: Self-pay | Admitting: Cardiology

## 2018-02-10 ENCOUNTER — Ambulatory Visit (INDEPENDENT_AMBULATORY_CARE_PROVIDER_SITE_OTHER): Payer: Medicare Other

## 2018-02-10 DIAGNOSIS — I5022 Chronic systolic (congestive) heart failure: Secondary | ICD-10-CM | POA: Diagnosis not present

## 2018-02-10 DIAGNOSIS — I428 Other cardiomyopathies: Secondary | ICD-10-CM

## 2018-02-10 DIAGNOSIS — Z95810 Presence of automatic (implantable) cardiac defibrillator: Secondary | ICD-10-CM | POA: Diagnosis not present

## 2018-02-10 NOTE — Telephone Encounter (Signed)
LMOVM reminding pt to send remote transmission.   

## 2018-02-11 NOTE — Progress Notes (Signed)
Remote ICD transmission.   

## 2018-02-11 NOTE — Progress Notes (Signed)
EPIC Encounter for ICM Monitoring  Patient Name: Walter [name] is a 72 y.o. male Date: 02/11/2018 Primary Care Physican: London Pepper, MD Primary Cardiologist:McLean Electrophysiologist: Allred Last Weight: 263lbs Bi-V Pacing:99%                                                     Heart failure questions and no fluid symptoms.  He has a cold and congestion.  He has increased fluids since he has a cold.  Advised to limit fluid intake to 64 oz daily   Thoracic impedance abnormal suggesting fluid accumulation since 02/08/2018.   Prescribed: Furosemide20 mgTake 1 tablet (20 mg total) by mouthdaily.  Labs: 01/21/2018 Creatinine 1.13, BUN 16, Potassium 4.0, Sodium 138, eGFR >60 05/15/2017 Creatinine 1.53, BUN 24, Potassium 4.0, Sodium 135,eGFR >60 05/03/2017 Creatinine 1.08, BUN 16, Potassium 4.3, Sodium 137,eGFR >60 01/03/2017 Creatinine 1.27, BUN 19, Potassium 4.4, Sodium 137 03/28/2016 Creatinine 1.26, BUN 18, Potassium 4.8, Sodium 137  Recommendations: Unable to reach.  Follow-up plan: ICM clinic phone appointment on 02/20/2018 to recheck fluid levels.     Copy of ICM check sent to Dr. Rayann Heman and Dr Aundra Dubin.   3 month ICM trend: 02/11/2018    1 Year ICM trend:       Rosalene Billings, RN 02/11/2018 2:25 PM

## 2018-02-20 ENCOUNTER — Ambulatory Visit (INDEPENDENT_AMBULATORY_CARE_PROVIDER_SITE_OTHER): Payer: Medicare Other

## 2018-02-20 ENCOUNTER — Telehealth: Payer: Self-pay

## 2018-02-20 DIAGNOSIS — I5022 Chronic systolic (congestive) heart failure: Secondary | ICD-10-CM

## 2018-02-20 DIAGNOSIS — Z95810 Presence of automatic (implantable) cardiac defibrillator: Secondary | ICD-10-CM

## 2018-02-20 NOTE — Telephone Encounter (Signed)
Confirmed remote transmission w/ pt wife.   

## 2018-02-21 ENCOUNTER — Telehealth (HOSPITAL_COMMUNITY): Payer: Self-pay

## 2018-02-21 ENCOUNTER — Other Ambulatory Visit: Payer: Self-pay

## 2018-02-21 ENCOUNTER — Ambulatory Visit (HOSPITAL_COMMUNITY)
Admission: RE | Admit: 2018-02-21 | Discharge: 2018-02-21 | Disposition: A | Discharge status: Home / Self Care | Payer: Medicare Other | Source: Ambulatory Visit | Attending: Cardiology | Admitting: Cardiology

## 2018-02-21 ENCOUNTER — Telehealth: Payer: Self-pay

## 2018-02-21 ENCOUNTER — Ambulatory Visit (HOSPITAL_BASED_OUTPATIENT_CLINIC_OR_DEPARTMENT_OTHER): Payer: Medicare Other

## 2018-02-21 DIAGNOSIS — I10 Essential (primary) hypertension: Secondary | ICD-10-CM | POA: Insufficient documentation

## 2018-02-21 DIAGNOSIS — E119 Type 2 diabetes mellitus without complications: Secondary | ICD-10-CM | POA: Diagnosis not present

## 2018-02-21 DIAGNOSIS — I519 Heart disease, unspecified: Secondary | ICD-10-CM

## 2018-02-21 DIAGNOSIS — I447 Left bundle-branch block, unspecified: Secondary | ICD-10-CM | POA: Diagnosis not present

## 2018-02-21 DIAGNOSIS — E785 Hyperlipidemia, unspecified: Secondary | ICD-10-CM | POA: Diagnosis not present

## 2018-02-21 DIAGNOSIS — I480 Paroxysmal atrial fibrillation: Secondary | ICD-10-CM | POA: Insufficient documentation

## 2018-02-21 DIAGNOSIS — I428 Other cardiomyopathies: Secondary | ICD-10-CM | POA: Insufficient documentation

## 2018-02-21 LAB — TSH: TSH: 3.613 u[IU]/mL (ref 0.350–4.500)

## 2018-02-21 LAB — T4, FREE: Free T4: 1.14 ng/dL (ref 0.82–1.77)

## 2018-02-21 MED ORDER — PERFLUTREN LIPID MICROSPHERE
1.0000 mL | INTRAVENOUS | Status: AC | PRN
  Administered 2018-02-21: 2 mL via INTRAVENOUS

## 2018-02-21 NOTE — Progress Notes (Signed)
EPIC Encounter for ICM Monitoring  Patient Name: Walter [name] is a 72 y.o. male Date: 02/21/2018 Primary Care Physican: London Pepper, MD Primary Cardiologist:McLean Electrophysiologist: Allred Bi-V Pacing:99%  Last Weight:263lbs Today's Weight:  unknown       Attempted call to patient and unable to reach.  Left message to return call.  Transmission reviewed.    Thoracic impedance returned to normal since last ICM remote transmission 02/10/2018.  Prescribed: Furosemide20 mgTake 1 tablet (20 mg total) by mouthdaily.  Labs: 01/21/2018 Creatinine 1.13, BUN 16, Potassium 4.0, Sodium 138, eGFR >60 05/15/2017 Creatinine 1.53, BUN 24, Potassium 4.0, Sodium 135,eGFR >60 05/03/2017 Creatinine 1.08, BUN 16, Potassium 4.3, Sodium 137,eGFR >60 01/03/2017 Creatinine 1.27, BUN 19, Potassium 4.4, Sodium 137 03/28/2016 Creatinine 1.26, BUN 18, Potassium 4.8, Sodium 137  Recommendations:  Unable to reach.  Follow-up plan: ICM clinic phone appointment on 03/17/2018.   Copy of ICM check sent to Dr. Rayann Heman.   3 month ICM trend: 02/20/2018    1 Year ICM trend:       Rosalene Billings, RN 02/21/2018 8:34 AM

## 2018-02-21 NOTE — Telephone Encounter (Signed)
Remote ICM transmission received.  Attempted call to patient regarding ICM remote transmission and left message, per DPR, to return call.    

## 2018-02-21 NOTE — Telephone Encounter (Signed)
-----   Message from Larey Dresser, MD sent at 02/21/2018  3:17 PM EST ----- EF 50-55%, low normal function.

## 2018-02-21 NOTE — Telephone Encounter (Signed)
Spoke with pt about results of echo. Pt aware, agreeable, and verbalizes understanding.

## 2018-02-21 NOTE — Telephone Encounter (Signed)
Pt called unable to get to the phone will call back to get echo results

## 2018-02-22 LAB — T3, FREE: T3, Free: 2.9 pg/mL (ref 2.0–4.4)

## 2018-02-28 NOTE — Progress Notes (Addendum)
Attempted return call to patient as requested by voice mail message.  He left message the best way to reach him is to use home phone number.  No answer and no answering machine on home phone.

## 2018-03-17 ENCOUNTER — Ambulatory Visit (INDEPENDENT_AMBULATORY_CARE_PROVIDER_SITE_OTHER): Payer: Medicare Other

## 2018-03-17 DIAGNOSIS — I5022 Chronic systolic (congestive) heart failure: Secondary | ICD-10-CM

## 2018-03-17 DIAGNOSIS — Z95810 Presence of automatic (implantable) cardiac defibrillator: Secondary | ICD-10-CM | POA: Diagnosis not present

## 2018-03-17 NOTE — Progress Notes (Signed)
EPIC Encounter for ICM Monitoring  Patient Name: Walter [name] is a 72 y.o. male Date: 03/17/2018 Primary Care Physican: London Pepper, MD Primary Cardiologist:McLean Electrophysiologist: Allred Bi-V Pacing:99%      LastWeight:263lbs Today's Weight:  unknown                                                   Transmission reviewed.    Thoracic impedance returned to normal since last ICM remote transmission 02/10/2018.  Prescribed: Furosemide20 mgTake 1 tablet (20 mg total) by mouthdaily.  Labs: 01/21/2018 Creatinine 1.13, BUN 16, Potassium 4.0, Sodium 138, eGFR >60 05/15/2017 Creatinine 1.53, BUN 24, Potassium 4.0, Sodium 135,eGFR >60 05/03/2017 Creatinine 1.08, BUN 16, Potassium 4.3, Sodium 137,eGFR >60 01/03/2017 Creatinine 1.27, BUN 19, Potassium 4.4, Sodium 137 03/28/2016 Creatinine 1.26, BUN 18, Potassium 4.8, Sodium 137  Recommendations:  None  Follow-up plan: ICM clinic phone appointment on 05/19/2018.  Office appointment scheduled with Chanetta Marshall, NP on 04/16/2018.   Copy of ICM check sent to Dr. Rayann Heman.   3 month ICM trend: 03/17/2018  *  1 Year ICM trend:       Rosalene Billings, RN 03/17/2018 5:06 PM

## 2018-03-20 DIAGNOSIS — M47817 Spondylosis without myelopathy or radiculopathy, lumbosacral region: Secondary | ICD-10-CM | POA: Diagnosis not present

## 2018-03-20 DIAGNOSIS — M545 Low back pain: Secondary | ICD-10-CM | POA: Diagnosis not present

## 2018-03-20 DIAGNOSIS — G894 Chronic pain syndrome: Secondary | ICD-10-CM | POA: Diagnosis not present

## 2018-03-24 ENCOUNTER — Other Ambulatory Visit: Payer: Self-pay | Admitting: Cardiology

## 2018-03-26 DIAGNOSIS — Z1211 Encounter for screening for malignant neoplasm of colon: Secondary | ICD-10-CM | POA: Diagnosis not present

## 2018-03-26 DIAGNOSIS — Z1212 Encounter for screening for malignant neoplasm of rectum: Secondary | ICD-10-CM | POA: Diagnosis not present

## 2018-04-02 DIAGNOSIS — M1990 Unspecified osteoarthritis, unspecified site: Secondary | ICD-10-CM | POA: Diagnosis not present

## 2018-04-02 DIAGNOSIS — M47817 Spondylosis without myelopathy or radiculopathy, lumbosacral region: Secondary | ICD-10-CM | POA: Diagnosis not present

## 2018-04-02 DIAGNOSIS — G894 Chronic pain syndrome: Secondary | ICD-10-CM | POA: Diagnosis not present

## 2018-04-02 DIAGNOSIS — M1710 Unilateral primary osteoarthritis, unspecified knee: Secondary | ICD-10-CM | POA: Diagnosis not present

## 2018-04-03 DIAGNOSIS — M47817 Spondylosis without myelopathy or radiculopathy, lumbosacral region: Secondary | ICD-10-CM | POA: Diagnosis not present

## 2018-04-04 LAB — CUP PACEART REMOTE DEVICE CHECK
Battery Remaining Longevity: 36 mo
Battery Remaining Percentage: 44 %
Battery Voltage: 2.92 V
Brady Statistic AP VP Percent: 4.4 %
Brady Statistic AP VS Percent: 1 %
Brady Statistic AS VP Percent: 94 %
Brady Statistic AS VS Percent: 1 %
Brady Statistic RA Percent Paced: 4.8 %
Date Time Interrogation Session: 20191125180601
HighPow Impedance: 73 Ohm
HighPow Impedance: 73 Ohm
Implantable Lead Implant Date: 20151119
Implantable Lead Implant Date: 20151119
Implantable Lead Implant Date: 20151119
Implantable Lead Location: 753858
Implantable Lead Location: 753859
Implantable Lead Location: 753860
Implantable Pulse Generator Implant Date: 20151119
Lead Channel Impedance Value: 350 Ohm
Lead Channel Impedance Value: 410 Ohm
Lead Channel Impedance Value: 510 Ohm
Lead Channel Pacing Threshold Amplitude: 0.75 V
Lead Channel Pacing Threshold Amplitude: 0.75 V
Lead Channel Pacing Threshold Amplitude: 1 V
Lead Channel Pacing Threshold Pulse Width: 0.5 ms
Lead Channel Pacing Threshold Pulse Width: 0.5 ms
Lead Channel Pacing Threshold Pulse Width: 0.5 ms
Lead Channel Sensing Intrinsic Amplitude: 1.4 mV
Lead Channel Sensing Intrinsic Amplitude: 12 mV
Lead Channel Setting Pacing Amplitude: 1.5 V
Lead Channel Setting Pacing Amplitude: 2 V
Lead Channel Setting Pacing Pulse Width: 0.5 ms
Lead Channel Setting Pacing Pulse Width: 0.5 ms
Lead Channel Setting Sensing Sensitivity: 0.5 mV
MDC IDC SET LEADCHNL RV PACING AMPLITUDE: 2.5 V
Pulse Gen Serial Number: 7211486

## 2018-04-15 NOTE — Progress Notes (Signed)
Electrophysiology Office Note Date: 04/16/2018  ID:  Walter, [name] May 11, 1945, MRN [id number]  PCP: Walter Pepper, MD Primary Cardiologist: Aundra Dubin Electrophysiologist: Allred  CC: Routine ICD follow-up  Walter [name] is a 73 y.o. male seen today for [name].  He presents today for routine electrophysiology followup.  Since last being seen in our clinic, the patient reports doing very well. He denies chest pain, palpitations, dyspnea, PND, orthopnea, nausea, vomiting, dizziness, syncope, edema, weight gain, or early satiety.  He has not had ICD shocks.   Device History: STJ CRTD implanted 2015 for NICM, CHF History of appropriate therapy: No History of AAD therapy: Yes - amiodarone for atrial fibrillation   Past Medical History:  Diagnosis Date  . AICD (automatic cardioverter/defibrillator) present   . Anxiety   . Asbestosis ([place])    mild  . Atrial fibrillation ([place])   . Chronic low back pain    /sciatica- Dr Letta Median [facility])  . Complication of anesthesia 1996   previous surgery had a run of v-tach  . Coronary artery disease, non-occlusive June 2011   Cardiac cath in [place] following abnormal Myoview  . DJD (degenerative joint disease)    /osteoarthritis  . Dyslipidemia, goal LDL below 100    On lovastatin (myalgias with Zocor and Lipitor)  . Dyspnea    worse in hot weather  . Dysrhythmia   . Essential hypertension   . Factor V Leiden ([place])   . Frozen shoulder    right  . GERD (gastroesophageal reflux disease)   . History of kidney stones   . Hypogonadism male   . Irritable bowel   . Kidney stone   . Left bundle branch block (LBBB) on electrocardiogram    Diagnosed close to 20 years ago  . Myocardial infarction ([place])   . Non-ischemic cardiomyopathy [facility]) June 2011   EF 45-50%; suspect related to LBBB; repeat Echo March 2015: EF 50- 55%  . Obesity   . PONV (postoperative nausea and vomiting)   . Restless leg syndrome   .  Rotator cuff arthropathy of right shoulder 05/14/2017  . Testicular cancer ([place]) 1972   left  . Tremor, essential 12/09/2015  . Type 2 diabetes mellitus without complication ([place]) 04/5850   Type 2.    Past Surgical History:  Procedure Laterality Date  . APPENDECTOMY N/A   . BI-VENTRICULAR IMPLANTABLE CARDIOVERTER DEFIBRILLATOR N/A 02/04/2014   SJM Quadra Assura BiV ICD implanted for primary prevention by [name]  . CARDIAC CATHETERIZATION  June 2011   Nonobstructive CAD  . CARDIAC DEFIBRILLATOR PLACEMENT  02/04/14   [facility] model [place] 3365-40Q (serial Number [id number]) biventricular ICD.  Marland Kitchen CARDIOVERSION N/A 10/13/2013   Procedure: CARDIOVERSION;  Surgeon: Larey Dresser, MD;  Location: [place];  Service: Cardiovascular;  Laterality: N/A;  . CARDIOVERSION  10-16-2013   DCCV by [name] following intracardiac echo to rule out LAA thrombus  . CARDIOVERSION N/A 10/16/2013   Procedure: CARDIOVERSION;  Surgeon: Coralyn Mark, MD;  Location: [facility] CATH LAB;  Service: Cardiovascular;  Laterality: N/A;  . CHOLECYSTECTOMY OPEN    . ELECTROPHYSIOLOGY STUDY N/A 10/16/2013   Procedure: ELECTROPHYSIOLOGY STUDY;  Surgeon: Coralyn Mark, MD;  Location: [facility] CATH LAB;  Service: Cardiovascular;  Laterality: N/A;  . ESOPHAGOGASTRODUODENOSCOPY N/A 10/15/2013   Procedure: ESOPHAGOGASTRODUODENOSCOPY (EGD);  Surgeon: Gatha Mayer, MD;  Location: [facility] ENDOSCOPY;  Service: Endoscopy;  Laterality: N/A;  bedside  . HERNIA REPAIR    .  KNEE ARTHROSCOPY Left   . LEFT AND RIGHT HEART CATHETERIZATION WITH CORONARY ANGIOGRAM N/A 10/12/2013   Procedure: LEFT AND RIGHT HEART CATHETERIZATION WITH CORONARY ANGIOGRAM;  Surgeon: Troy Sine, MD;  Location: [facility] CATH LAB;  Service: Cardiovascular;  Laterality: N/A;  . lymph nodes    . RADIOFREQUENCY ABLATION NERVES     Back every 6 months  . REVERSE SHOULDER ARTHROPLASTY Right 05/14/2017   Procedure: TOTAL REVERSE SHOULDER ARTHROPLASTY;  Surgeon: Marchia Bond, MD;  Location: [place];  Service: Orthopedics;  Laterality: Right;  . ROTATOR CUFF REPAIR Right   . SURGERY SCROTAL / TESTICULAR Left   . TONSILLECTOMY Bilateral   . TOTAL HIP ARTHROPLASTY Right 02/22/2015   Procedure: RIGHT TOTAL HIP ARTHROPLASTY ANTERIOR APPROACH;  Surgeon: Renette Butters, MD;  Location: [place];  Service: Orthopedics;  Laterality: Right;  . TRANSTHORACIC ECHOCARDIOGRAM  May 2011   EF 45% with diffuse hypokinesis; septal bounce from LBBB  . TRANSTHORACIC ECHOCARDIOGRAM  March 2015   EF 50-55%. Mild concentric LVH. Septal dyssynergy from LBBB     Current Outpatient Medications  Medication Sig Dispense Refill  . albuterol (PROVENTIL HFA;VENTOLIN HFA) 108 (90 Base) MCG/ACT inhaler Inhale 1-2 puffs into the lungs every 6 (six) hours as needed for wheezing or shortness of breath.    Marland Kitchen amiodarone (PACERONE) 200 MG tablet Take 0.5 tablets (100 mg total) by mouth daily. 45 tablet 3  . apixaban (ELIQUIS) 5 MG TABS tablet Take 1 tablet (5 mg total) by mouth 2 (two) times daily. 180 tablet 3  . carvedilol (COREG) 12.5 MG tablet Take 1 tablet (12.5 mg total) by mouth 2 (two) times daily. 180 tablet 3  . cholecalciferol (VITAMIN D) 1000 UNITS tablet Take 1,000 Units by mouth daily.     . furosemide (LASIX) 20 MG tablet Take 1 tablet (20 mg total) by mouth daily. 90 tablet 2  . gabapentin (NEURONTIN) 300 MG capsule Take 300-600 mg by mouth daily as needed.    Marland Kitchen lisinopril (PRINIVIL,ZESTRIL) 10 MG tablet Take 1 tablet (10 mg total) by mouth every evening. 90 tablet 3  . lovastatin (ALTOPREV) 40 MG 24 hr tablet Take 40 mg by mouth at bedtime.    . magnesium oxide (MAG-OX) 400 MG tablet Take 400 mg by mouth daily.     . metFORMIN (GLUCOPHAGE-XR) 500 MG 24 hr tablet Take 1,000 mg by mouth 2 (two) times daily.    Marland Kitchen omega-3 acid ethyl esters (LOVAZA) 1 G capsule Take 2 g by mouth 2 (two) times daily. Morning & afternoon.    Marland Kitchen oxyCODONE-acetaminophen (PERCOCET) 10-325 MG tablet Take 1-2  tablets by mouth every 6 (six) hours as needed (FOR PAIN.). 30 tablet 0  . pantoprazole (PROTONIX) 40 MG tablet Take 40 mg by mouth daily before breakfast.    . rOPINIRole (REQUIP) 0.5 MG tablet Take 1 tablet by mouth daily as needed.    Marland Kitchen rOPINIRole (REQUIP) 1 MG tablet Take 1 mg by mouth 2 (two) times daily. 1600 & BEDTIME    . sildenafil (VIAGRA) 100 MG tablet Take 100 mg by mouth daily as needed for erectile dysfunction.    Marland Kitchen spironolactone (ALDACTONE) 25 MG tablet TAKE 1 TABLET (25 MG) BY MOUTH DAILY IN THE MORNING. 90 tablet 3  . testosterone cypionate (DEPO-TESTOSTERONE) 200 MG/ML injection Inject 200 mg into the muscle See admin instructions. EVERY 10 DAYS    . tiZANidine (ZANAFLEX) 4 MG capsule Take 4 mg by mouth 2 (two) times daily as needed  for muscle spasms.     . vitamin B-12 (CYANOCOBALAMIN) 250 MCG tablet Take 250 mcg by mouth daily.     No current facility-administered medications for this visit.     Allergies:   Statins   Social History: Social History   Socioeconomic History  . Marital status: Married    Spouse name: Not on file  . Number of children: 2  . Years of education: 3  . Highest education level: Not on file  Occupational History  . Occupation: Retired  Scientific laboratory technician  . Financial resource strain: Not on file  . Food insecurity:    Worry: Not on file    Inability: Not on file  . Transportation needs:    Medical: Not on file    Non-medical: Not on file  Tobacco Use  . Smoking status: Former Smoker    Types: Cigarettes    Last attempt to quit: 02/10/1973    Years since quitting: 45.2  . Smokeless tobacco: Never Used  Substance and Sexual Activity  . Alcohol use: Yes    Comment: rare  . Drug use: No  . Sexual activity: Not on file    Comment: Married  Lifestyle  . Physical activity:    Days per week: Not on file    Minutes per session: Not on file  . Stress: Not on file  Relationships  . Social connections:    Talks on phone: Not on file     Gets together: Not on file    Attends religious service: Not on file    Active member of club or organization: Not on file    Attends meetings of clubs or organizations: Not on file    Relationship status: Not on file  . Intimate partner violence:    Fear of current or ex partner: Not on file    Emotionally abused: Not on file    Physically abused: Not on file    Forced sexual activity: Not on file  Other Topics Concern  . Not on file  Social History Narrative   Lives at home w/ his wife, daughter and granddaughter   Left-handed   Caffeine: 1-2 diet sodas per day    Family History: Family History  Problem Relation Age of Onset  . Hypertension Mother   . Heart disease Mother   . Heart disease Father   . Hypertension Brother   . Heart attack Maternal Uncle   . Hypertension Brother   . Heart disease Paternal Uncle     Review of Systems: All other systems reviewed and are otherwise negative except as noted above.   Physical Exam: VS:  BP 106/72   Pulse 78   Ht 6' (1.829 m)   Wt 266 lb 6.4 oz (120.8 kg)   SpO2 93%   BMI 36.13 kg/m  , BMI Body mass index is 36.13 kg/m.  GEN- The patient is well appearing, alert and oriented x 3 today.   HEENT: normocephalic, atraumatic; sclera clear, conjunctiva pink; hearing intact; oropharynx clear; neck supple  Lungs- Clear to ausculation bilaterally, normal work of breathing.  No wheezes, rales, rhonchi Heart- Regular rate and rhythm  GI- soft, non-tender, non-distended, bowel sounds present  Extremities- no clubbing, cyanosis, + BLE dependent edema MS- no significant deformity or atrophy Skin- warm and dry, no rash or lesion; ICD pocket well healed Psych- euthymic mood, full affect Neuro- strength and sensation are intact  ICD interrogation- reviewed in detail today,  See PACEART report  EKG:  EKG is not ordered today.  Recent Labs: 01/21/2018: ALT 27; BUN 16; Creatinine, Ser 1.13; Hemoglobin 17.5; Platelets 182; Potassium  4.0; Sodium 138 02/21/2018: TSH 3.613   Wt Readings from Last 3 Encounters:  04/16/18 266 lb 6.4 oz (120.8 kg)  01/21/18 264 lb 6.4 oz (119.9 kg)  05/14/17 258 lb (117 kg)     Other studies Reviewed: Additional studies/ records that were reviewed today include: AHF, Dr Jackalyn Lombard office notes  Assessment and Plan:  1.  Chronic systolic dysfunction euvolemic today EF normalized post CRT Stable on an appropriate medical regimen Normal ICD function See Pace Art report No changes today  2.  Paroxysmal atrial fibrillation Prior tachycardia induced cardiomyopathy Continue low dose amiodarone (dose decreased 2/2 tremor) Continue Eliquis for CHADS2VASC of 4 CBC, BMET recently done - reviewed  3.  HTN Stable No change required today    Current medicines are reviewed at length with the patient today.   The patient does not have concerns regarding his medicines.  The following changes were made today:  none  Labs/ tests ordered today include: none No orders of the defined types were placed in this encounter.    Disposition:   Follow up with Delilah Shan, ICM clinic, [name] in 1 year  Signed, Chanetta Marshall, NP 04/16/2018 9:51 AM  [facility] [address] [place] [address] [phone] (office) [phone] (fax)

## 2018-04-16 ENCOUNTER — Encounter: Payer: Self-pay | Admitting: Nurse Practitioner

## 2018-04-16 ENCOUNTER — Ambulatory Visit (INDEPENDENT_AMBULATORY_CARE_PROVIDER_SITE_OTHER): Payer: Medicare Other | Admitting: Nurse Practitioner

## 2018-04-16 VITALS — BP 106/72 | HR 78 | Ht 72.0 in | Wt 266.4 lb

## 2018-04-16 DIAGNOSIS — I10 Essential (primary) hypertension: Secondary | ICD-10-CM

## 2018-04-16 DIAGNOSIS — I480 Paroxysmal atrial fibrillation: Secondary | ICD-10-CM

## 2018-04-16 DIAGNOSIS — I5022 Chronic systolic (congestive) heart failure: Secondary | ICD-10-CM | POA: Diagnosis not present

## 2018-04-16 LAB — CUP PACEART INCLINIC DEVICE CHECK
Implantable Lead Implant Date: 20151119
Implantable Lead Implant Date: 20151119
Implantable Lead Implant Date: 20151119
Implantable Lead Location: 753859
Implantable Lead Location: 753860
Implantable Pulse Generator Implant Date: 20151119
MDC IDC LEAD LOCATION: 753858
MDC IDC SESS DTM: 20200129095327
Pulse Gen Serial Number: 7211486

## 2018-04-16 NOTE — Patient Instructions (Signed)
Medication Instructions:  none If you need a refill on your cardiac medications before your next appointment, please call your pharmacy.   Lab work: none If you have labs (blood work) drawn today and your tests are completely normal, you will receive your results only by: Marland Kitchen MyChart Message (if you have MyChart) OR . A paper copy in the mail If you have any lab test that is abnormal or we need to change your treatment, we will call you to review the results.  Testing/Procedures: none  Follow-Up: At [facility], you and your health needs are our priority.  As part of our continuing mission to provide you with exceptional heart care, we have created designated Provider Care Teams.  These Care Teams include your primary Cardiologist (physician) and Advanced Practice Providers (APPs -  Physician Assistants and Nurse Practitioners) who all work together to provide you with the care you need, when you need it. You will need a follow up appointment in 1 years.  Please call our office 2 months in advance to schedule this appointment.  You may see [name] or one of the following Advanced Practice Providers on your designated Care Team:   Chanetta Marshall, NP . Tommye Standard, PA-C  Any Other Special Instructions Will Be Listed Below (If Applicable). Remote monitoring is used to monitor your  ICD from home. This monitoring reduces the number of office visits required to check your device to one time per year. It allows [place] to keep an eye on the functioning of your device to ensure it is working properly. You are scheduled for a device check from home on 05/19/2018. You may send your transmission at any time that day. If you have a wireless device, the transmission will be sent automatically. After your physician reviews your transmission, you will receive a postcard with your next transmission date.

## 2018-04-30 DIAGNOSIS — M1710 Unilateral primary osteoarthritis, unspecified knee: Secondary | ICD-10-CM | POA: Diagnosis not present

## 2018-04-30 DIAGNOSIS — G894 Chronic pain syndrome: Secondary | ICD-10-CM | POA: Diagnosis not present

## 2018-04-30 DIAGNOSIS — M25559 Pain in unspecified hip: Secondary | ICD-10-CM | POA: Diagnosis not present

## 2018-04-30 DIAGNOSIS — Z79891 Long term (current) use of opiate analgesic: Secondary | ICD-10-CM | POA: Diagnosis not present

## 2018-04-30 DIAGNOSIS — M47817 Spondylosis without myelopathy or radiculopathy, lumbosacral region: Secondary | ICD-10-CM | POA: Diagnosis not present

## 2018-04-30 DIAGNOSIS — Z79899 Other long term (current) drug therapy: Secondary | ICD-10-CM | POA: Diagnosis not present

## 2018-05-16 DIAGNOSIS — Z0000 Encounter for general adult medical examination without abnormal findings: Secondary | ICD-10-CM | POA: Diagnosis not present

## 2018-05-16 DIAGNOSIS — E1169 Type 2 diabetes mellitus with other specified complication: Secondary | ICD-10-CM | POA: Diagnosis not present

## 2018-05-16 DIAGNOSIS — I10 Essential (primary) hypertension: Secondary | ICD-10-CM | POA: Diagnosis not present

## 2018-05-16 DIAGNOSIS — I4891 Unspecified atrial fibrillation: Secondary | ICD-10-CM | POA: Diagnosis not present

## 2018-05-16 DIAGNOSIS — E785 Hyperlipidemia, unspecified: Secondary | ICD-10-CM | POA: Diagnosis not present

## 2018-05-16 DIAGNOSIS — N400 Enlarged prostate without lower urinary tract symptoms: Secondary | ICD-10-CM | POA: Diagnosis not present

## 2018-05-16 DIAGNOSIS — E291 Testicular hypofunction: Secondary | ICD-10-CM | POA: Diagnosis not present

## 2018-05-16 DIAGNOSIS — R1030 Lower abdominal pain, unspecified: Secondary | ICD-10-CM | POA: Diagnosis not present

## 2018-05-19 ENCOUNTER — Ambulatory Visit (INDEPENDENT_AMBULATORY_CARE_PROVIDER_SITE_OTHER): Payer: Medicare Other | Admitting: *Deleted

## 2018-05-19 DIAGNOSIS — I5022 Chronic systolic (congestive) heart failure: Secondary | ICD-10-CM

## 2018-05-19 DIAGNOSIS — I428 Other cardiomyopathies: Secondary | ICD-10-CM

## 2018-05-20 ENCOUNTER — Ambulatory Visit: Payer: Medicare Other

## 2018-05-20 DIAGNOSIS — Z95810 Presence of automatic (implantable) cardiac defibrillator: Secondary | ICD-10-CM

## 2018-05-20 DIAGNOSIS — I5022 Chronic systolic (congestive) heart failure: Secondary | ICD-10-CM

## 2018-05-20 NOTE — Progress Notes (Signed)
EPIC Encounter for ICM Monitoring  Patient Name: Walter [name] is a 73 y.o. male Date: 05/20/2018 Primary Care Physican: London Pepper, MD Primary Cardiologist:McLean Electrophysiologist: Allred Bi-V Pacing:99% LastWeight:263lbs Today's Weight: weight increase but unsure of amount.   Heart failure questions reviewed.  He has not been adhering to low salt diet since his wife had surgery and has not been taking Furosemide.    Thoracic impedanceabnormalsuggesting fluid accumulation since 05/15/2018.  Prescribed:Furosemide20 mgTake 1 tablet (20 mg total) by mouthdaily.  Labs: 01/21/2018 Creatinine 1.13, BUN 16, Potassium 4.0, Sodium 138, GFR >60 05/15/2017 Creatinine 1.53, BUN 24, Potassium 4.0, Sodium 135,GFR >60 05/03/2017 Creatinine 1.08, BUN 16, Potassium 4.3, Sodium 137,GFR >60 01/03/2017 Creatinine 1.27, BUN 19, Potassium 4.4, Sodium 137 03/28/2016 Creatinine 1.26, BUN 18, Potassium 4.8, Sodium 137  Recommendations: Patient resumed daily Furosemide 20 mg but will double it for a couple of days.  He plans on decreasing salt intake.  Follow-up plan: ICM clinic phone appointment on3/08/2018 to recheck fluid levels.   Copy of ICM check sent to Dr.Allred and Dr Aundra Dubin.  3 month ICM trend: 05/20/2018    1 Year ICM trend:       Rosalene Billings, RN 05/20/2018 11:35 AM

## 2018-05-21 LAB — CUP PACEART REMOTE DEVICE CHECK
Battery Remaining Longevity: 34 mo
Battery Remaining Percentage: 41 %
Battery Voltage: 2.9 V
Brady Statistic AP VP Percent: 4.2 %
Brady Statistic AP VS Percent: 1 %
Brady Statistic AS VP Percent: 95 %
Brady Statistic AS VS Percent: 1 %
Brady Statistic RA Percent Paced: 4.6 %
Date Time Interrogation Session: 20200303133331
HighPow Impedance: 72 Ohm
HighPow Impedance: 72 Ohm
Implantable Lead Implant Date: 20151119
Implantable Lead Implant Date: 20151119
Implantable Lead Location: 753858
Implantable Lead Location: 753859
Implantable Lead Location: 753860
Implantable Pulse Generator Implant Date: 20151119
Lead Channel Impedance Value: 410 Ohm
Lead Channel Impedance Value: 530 Ohm
Lead Channel Pacing Threshold Amplitude: 0.5 V
Lead Channel Pacing Threshold Amplitude: 0.75 V
Lead Channel Pacing Threshold Amplitude: 1 V
Lead Channel Pacing Threshold Pulse Width: 0.5 ms
Lead Channel Pacing Threshold Pulse Width: 0.5 ms
Lead Channel Pacing Threshold Pulse Width: 0.5 ms
Lead Channel Sensing Intrinsic Amplitude: 12 mV
Lead Channel Setting Pacing Amplitude: 1.5 V
Lead Channel Setting Pacing Amplitude: 2 V
Lead Channel Setting Pacing Amplitude: 2.5 V
Lead Channel Setting Pacing Pulse Width: 0.5 ms
Lead Channel Setting Sensing Sensitivity: 0.5 mV
MDC IDC LEAD IMPLANT DT: 20151119
MDC IDC MSMT LEADCHNL RA SENSING INTR AMPL: 2.7 mV
MDC IDC MSMT LEADCHNL RV IMPEDANCE VALUE: 350 Ohm
MDC IDC SET LEADCHNL LV PACING PULSEWIDTH: 0.5 ms
Pulse Gen Serial Number: 7211486

## 2018-05-22 ENCOUNTER — Other Ambulatory Visit: Payer: Self-pay | Admitting: Family Medicine

## 2018-05-22 DIAGNOSIS — R1032 Left lower quadrant pain: Secondary | ICD-10-CM

## 2018-05-23 ENCOUNTER — Other Ambulatory Visit: Payer: Self-pay | Admitting: Family Medicine

## 2018-05-23 ENCOUNTER — Ambulatory Visit (INDEPENDENT_AMBULATORY_CARE_PROVIDER_SITE_OTHER): Payer: Medicare Other

## 2018-05-23 DIAGNOSIS — Z95810 Presence of automatic (implantable) cardiac defibrillator: Secondary | ICD-10-CM

## 2018-05-23 DIAGNOSIS — R1032 Left lower quadrant pain: Secondary | ICD-10-CM

## 2018-05-23 DIAGNOSIS — I5022 Chronic systolic (congestive) heart failure: Secondary | ICD-10-CM

## 2018-05-23 DIAGNOSIS — Q550 Absence and aplasia of testis: Secondary | ICD-10-CM

## 2018-05-23 NOTE — Progress Notes (Signed)
EPIC Encounter for ICM Monitoring  Patient Name: Walter [name] is a 73 y.o. male Date: 05/23/2018 Primary Care Physican: London Pepper, MD Primary Cardiologist:McLean Electrophysiologist: Allred Bi-V Pacing:99% LastWeight:263lbs Today's Weight: 258 lbs   Heart failure questions reviewed.  He currently has a UTI and trying to balance his fluid intake.  No fluid symptoms today and weight loss from extra Furosemide.   Thoracic impedancereturned to normalafter taking extra Furosemide.   Prescribed:Furosemide20 mgTake 1 tablet (20 mg total) by mouthdaily.  Labs: 01/21/2018 Creatinine 1.13, BUN 16, Potassium 4.0, Sodium 138, GFR >60 05/15/2017 Creatinine 1.53, BUN 24, Potassium 4.0, Sodium 135,GFR >60 05/03/2017 Creatinine 1.08, BUN 16, Potassium 4.3, Sodium 137,GFR >60 01/03/2017 Creatinine 1.27, BUN 19, Potassium 4.4, Sodium 137 03/28/2016 Creatinine 1.26, BUN 18, Potassium 4.8, Sodium 137  Recommendations: No changes and encouraged to call for fluid symptoms.   Follow-up plan: ICM clinic phone appointment on4/08/2018.   Copy of ICM check sent to Dr.Allred and Dr Aundra Dubin.   3 month ICM trend: 05/23/2018    1 Year ICM trend:       Rosalene Billings, RN 05/23/2018 4:31 PM

## 2018-05-26 NOTE — Progress Notes (Signed)
Remote ICD transmission.   

## 2018-05-28 DIAGNOSIS — M25559 Pain in unspecified hip: Secondary | ICD-10-CM | POA: Diagnosis not present

## 2018-05-28 DIAGNOSIS — G894 Chronic pain syndrome: Secondary | ICD-10-CM | POA: Diagnosis not present

## 2018-05-28 DIAGNOSIS — M47817 Spondylosis without myelopathy or radiculopathy, lumbosacral region: Secondary | ICD-10-CM | POA: Diagnosis not present

## 2018-05-28 DIAGNOSIS — M1710 Unilateral primary osteoarthritis, unspecified knee: Secondary | ICD-10-CM | POA: Diagnosis not present

## 2018-05-29 ENCOUNTER — Other Ambulatory Visit: Payer: Medicare Other

## 2018-06-16 ENCOUNTER — Other Ambulatory Visit: Payer: Medicare Other

## 2018-06-17 DIAGNOSIS — M1710 Unilateral primary osteoarthritis, unspecified knee: Secondary | ICD-10-CM | POA: Diagnosis not present

## 2018-06-17 DIAGNOSIS — Z79899 Other long term (current) drug therapy: Secondary | ICD-10-CM | POA: Diagnosis not present

## 2018-06-17 DIAGNOSIS — Z79891 Long term (current) use of opiate analgesic: Secondary | ICD-10-CM | POA: Diagnosis not present

## 2018-06-17 DIAGNOSIS — G894 Chronic pain syndrome: Secondary | ICD-10-CM | POA: Diagnosis not present

## 2018-06-17 DIAGNOSIS — M25519 Pain in unspecified shoulder: Secondary | ICD-10-CM | POA: Diagnosis not present

## 2018-06-17 DIAGNOSIS — M47817 Spondylosis without myelopathy or radiculopathy, lumbosacral region: Secondary | ICD-10-CM | POA: Diagnosis not present

## 2018-06-23 ENCOUNTER — Ambulatory Visit (INDEPENDENT_AMBULATORY_CARE_PROVIDER_SITE_OTHER): Payer: Medicare Other

## 2018-06-23 ENCOUNTER — Other Ambulatory Visit: Payer: Self-pay

## 2018-06-23 ENCOUNTER — Telehealth: Payer: Self-pay

## 2018-06-23 DIAGNOSIS — I5022 Chronic systolic (congestive) heart failure: Secondary | ICD-10-CM | POA: Diagnosis not present

## 2018-06-23 DIAGNOSIS — Z95810 Presence of automatic (implantable) cardiac defibrillator: Secondary | ICD-10-CM | POA: Diagnosis not present

## 2018-06-23 NOTE — Telephone Encounter (Signed)
Remote ICM transmission received.  Attempted call to patient regarding ICM remote transmission and left message, per DPR, to return call.    

## 2018-06-23 NOTE — Progress Notes (Signed)
EPIC Encounter for ICM Monitoring  Patient Name: BINGHAM MILLETTE is a 73 y.o. male Date: 06/23/2018 Primary Care Physican: London Pepper, MD Primary Cardiologist:McLean Electrophysiologist: Allred Bi-V Pacing:>99% LastWeight:258lbs 06/23/2018 Weight:unknown   Attempted call to patient and unable to reach.  Left message to return call regarding transmission. Transmission reviewed.    Thoracic impedance abnormalsince 06/22/2018 suggesting fluid accumulation.    Prescribed:Furosemide20 mgTake 1 tablet (20 mg total) by mouthdaily.  Labs: 01/21/2018 Creatinine 1.13, BUN 16, Potassium 4.0, Sodium 138, GFR >60 05/15/2017 Creatinine 1.53, BUN 24, Potassium 4.0, Sodium 135,GFR >60 05/03/2017 Creatinine 1.08, BUN 16, Potassium 4.3, Sodium 137,GFR >60 01/03/2017 Creatinine 1.27, BUN 19, Potassium 4.4, Sodium 137 03/28/2016 Creatinine 1.26, BUN 18, Potassium 4.8, Sodium 137  Recommendations: Unable to reach.  Follow-up plan: ICM clinic phone appointment on4/14/2020 to recheck fluid levels.  Copy of ICM check sent to Dr.Allredand Dr Aundra Dubin.   3 month ICM trend: 06/23/2018    1 Year ICM trend:       Rosalene Billings, RN 06/23/2018 10:01 AM

## 2018-06-25 NOTE — Progress Notes (Signed)
Patient returned call.  He stated he is not sure why there is fluid accumulation. He has not consistently been taking Furosemide 20 daily but does take it often.  Weight is stable at 256-257 lbs.  Advised Dr Aundra Dubin recommended to increase Lasix to 40 mg daily x 3 days and then return to 20 mg daily.  Advised to take the 20 mg daily consistently.  He verbalized understanding.  Advised to call if develops any symptoms.  Recheck fluid levels 07/01/2018

## 2018-06-25 NOTE — Progress Notes (Signed)
Take Lasix 40 mg daily x 3 days then back to 20 mg daily.  

## 2018-06-25 NOTE — Progress Notes (Signed)
Attempted patient call and left message with daughter to return call. Provided ICM number.

## 2018-07-01 ENCOUNTER — Other Ambulatory Visit: Payer: Self-pay

## 2018-07-01 ENCOUNTER — Ambulatory Visit (INDEPENDENT_AMBULATORY_CARE_PROVIDER_SITE_OTHER): Payer: Medicare Other

## 2018-07-01 DIAGNOSIS — I5022 Chronic systolic (congestive) heart failure: Secondary | ICD-10-CM

## 2018-07-01 DIAGNOSIS — Z95810 Presence of automatic (implantable) cardiac defibrillator: Secondary | ICD-10-CM

## 2018-07-04 ENCOUNTER — Telehealth: Payer: Self-pay

## 2018-07-04 NOTE — Progress Notes (Signed)
EPIC Encounter for ICM Monitoring  Patient Name: Walter [name] is a 73 y.o. male Date: 07/04/2018 Primary Care Physican: London Pepper, MD Primary Cardiologist:McLean Electrophysiologist: Allred Bi-V Pacing:>99% LastWeight:256 - 257lbs 07/04/2018 Weight:259 lbs   Heart failure questions reviewed and has gained 2 lbs and has swelling in feet which indicates to him he has some fluid accumulation.  Reviewed foods eaten since 4/11.  He has been eating cheese, pizza and ham and advised these are all significantly high in salt.    Thoracic impedance returned to normal when patient followed Dr Claris Gladden order to take Lasix to 40 mg daily x 3 days (4/8-4/11) but impedance abormal again since 06/28/2018  Prescribed:Furosemide20 mgTake 1 tablet (20 mg total) by mouthdaily.  Labs: 01/21/2018 Creatinine 1.13, BUN 16, Potassium 4.0, Sodium 138, GFR >60 05/15/2017 Creatinine 1.53, BUN 24, Potassium 4.0, Sodium 135,GFR >60 05/03/2017 Creatinine 1.08, BUN 16, Potassium 4.3, Sodium 137,GFR >60 01/03/2017 Creatinine 1.27, BUN 19, Potassium 4.4, Sodium 137 03/28/2016 Creatinine 1.26, BUN 18, Potassium 4.8, Sodium 137  Recommendations: Patient self adjusted Furosemide today and took 40 mg today and plans on taking 40 mg tomorrow.  Advised more than likely his high salt intake is contributing to the repeat of fluid accumulation and to limit salt intake to 2000 mg daily.  Advised to review food labels.   Follow-up plan: ICM clinic phone appointment on4/22/2020 to recheck fluid levels.  Copy of ICM check sent to Dr.Allredand Dr Aundra Dubin for review and any further recommendation.   3 month ICM trend: 07/04/2018    1 Year ICM trend:       Rosalene Billings, RN 07/04/2018 11:26 AM

## 2018-07-04 NOTE — Telephone Encounter (Signed)
Left message for patient to remind of missed remote transmission.  

## 2018-07-09 ENCOUNTER — Other Ambulatory Visit: Payer: Self-pay

## 2018-07-09 ENCOUNTER — Ambulatory Visit (INDEPENDENT_AMBULATORY_CARE_PROVIDER_SITE_OTHER): Payer: Medicare Other

## 2018-07-09 DIAGNOSIS — I519 Heart disease, unspecified: Secondary | ICD-10-CM

## 2018-07-09 DIAGNOSIS — I5022 Chronic systolic (congestive) heart failure: Secondary | ICD-10-CM

## 2018-07-09 DIAGNOSIS — Z95810 Presence of automatic (implantable) cardiac defibrillator: Secondary | ICD-10-CM

## 2018-07-09 NOTE — Progress Notes (Signed)
EPIC Encounter for ICM Monitoring  Patient Name: Walter [name] is a 73 y.o. male Date: 07/09/2018 Primary Care Physican: London Pepper, MD Primary Cardiologist:McLean Electrophysiologist: Allred Bi-V Pacing:>99% LastWeight:256 - 257lbs 4/17/2020Weight:259 lbs 07/09/2018 Weight: 258 lbs   Heart failure questions reviewed.  Pt asymptomatic. He thinks the fluid accumulation is from the foods he is eating but is trying to do better at limiting salt.   [place] Thoracic impedance did return to normal after taking Lasix to 40 mg dailyx 3 days but impedance returned to abnormal again since 07/08/2018  Prescribed:Furosemide20 mgTake 1 tablet (20 mg total) by mouthdaily.  Labs: 01/21/2018 Creatinine 1.13, BUN 16, Potassium 4.0, Sodium 138, GFR >60 05/15/2017 Creatinine 1.53, BUN 24, Potassium 4.0, Sodium 135,GFR >60 05/03/2017 Creatinine 1.08, BUN 16, Potassium 4.3, Sodium 137,GFR >60 01/03/2017 Creatinine 1.27, BUN 19, Potassium 4.4, Sodium 137 03/28/2016 Creatinine 1.26, BUN 18, Potassium 4.8, Sodium 137  Recommendations: Will call back if any recommendations.   Follow-up plan: ICM clinic phone appointment on4/28/2020 (manual send)to recheck fluid levels.  Copy of ICM check sent to Dr.Allredand Dr Aundra Dubin for review and any further recommendation.  3 month ICM trend: 07/09/2018    1 Year ICM trend:       Rosalene Billings, RN 07/09/2018 4:49 PM

## 2018-07-10 NOTE — Progress Notes (Signed)
Increase Lasix to 40 mg daily and leave at 40 mg daily for now.  BMET 1 week.

## 2018-07-11 MED ORDER — FUROSEMIDE 20 MG PO TABS: 40.0000 mg | ORAL_TABLET | Freq: Every day | ORAL | 2 refills | Status: DC

## 2018-07-11 NOTE — Progress Notes (Signed)
Spoke with patient.  Advised Dr Aundra Dubin recommended to to Increase Lasix to 40 mg daily and leave at 40 mg daily for now.  He has supply on hand and advised to call pharmacy when refill needed.  BMET 1 week.  He verbalized understanding.  BMET scheduled for 07/21/2018 due to lab closed 5/1.

## 2018-07-15 ENCOUNTER — Telehealth: Payer: Self-pay

## 2018-07-15 ENCOUNTER — Other Ambulatory Visit: Payer: Self-pay

## 2018-07-15 NOTE — Telephone Encounter (Signed)
Left message for patient to remind of missed remote transmission.  

## 2018-07-16 DIAGNOSIS — M25519 Pain in unspecified shoulder: Secondary | ICD-10-CM | POA: Diagnosis not present

## 2018-07-16 DIAGNOSIS — M47817 Spondylosis without myelopathy or radiculopathy, lumbosacral region: Secondary | ICD-10-CM | POA: Diagnosis not present

## 2018-07-16 DIAGNOSIS — G894 Chronic pain syndrome: Secondary | ICD-10-CM | POA: Diagnosis not present

## 2018-07-16 DIAGNOSIS — M1710 Unilateral primary osteoarthritis, unspecified knee: Secondary | ICD-10-CM | POA: Diagnosis not present

## 2018-07-21 ENCOUNTER — Other Ambulatory Visit: Payer: Self-pay

## 2018-07-21 ENCOUNTER — Ambulatory Visit (HOSPITAL_COMMUNITY)
Admission: RE | Admit: 2018-07-21 | Discharge: 2018-07-21 | Disposition: A | Discharge status: Home / Self Care | Payer: Medicare Other | Source: Ambulatory Visit | Attending: Cardiology | Admitting: Cardiology

## 2018-07-21 DIAGNOSIS — I5022 Chronic systolic (congestive) heart failure: Secondary | ICD-10-CM | POA: Diagnosis not present

## 2018-07-21 LAB — BASIC METABOLIC PANEL
Anion gap: 10 (ref 5–15)
BUN: 22 mg/dL (ref 8–23)
CO2: 28 mmol/L (ref 22–32)
Calcium: 9.3 mg/dL (ref 8.9–10.3)
Chloride: 99 mmol/L (ref 98–111)
Creatinine, Ser: 1.43 mg/dL — ABNORMAL HIGH (ref 0.61–1.24)
GFR calc Af Amer: 56 mL/min — ABNORMAL LOW (ref >60)
GFR calc non Af Amer: 48 mL/min — ABNORMAL LOW (ref >60)
Glucose, Bld: 143 mg/dL — ABNORMAL HIGH (ref 70–99)
Potassium: 4.3 mmol/L (ref 3.5–5.1)
Sodium: 137 mmol/L (ref 135–145)

## 2018-07-29 NOTE — Progress Notes (Signed)
No ICM remote transmission received for 07/15/2018 and next ICM transmission scheduled for 08/04/2018.

## 2018-08-04 ENCOUNTER — Ambulatory Visit (INDEPENDENT_AMBULATORY_CARE_PROVIDER_SITE_OTHER): Payer: Medicare Other

## 2018-08-04 ENCOUNTER — Ambulatory Visit (HOSPITAL_COMMUNITY)
Admission: RE | Admit: 2018-08-04 | Discharge: 2018-08-04 | Disposition: A | Discharge status: Home / Self Care | Payer: Medicare Other | Source: Ambulatory Visit | Attending: Cardiology | Admitting: Cardiology

## 2018-08-04 ENCOUNTER — Other Ambulatory Visit: Payer: Self-pay

## 2018-08-04 ENCOUNTER — Telehealth: Payer: Self-pay

## 2018-08-04 ENCOUNTER — Encounter (HOSPITAL_COMMUNITY): Payer: Self-pay | Admitting: *Deleted

## 2018-08-04 DIAGNOSIS — I5022 Chronic systolic (congestive) heart failure: Secondary | ICD-10-CM | POA: Diagnosis not present

## 2018-08-04 DIAGNOSIS — I4819 Other persistent atrial fibrillation: Secondary | ICD-10-CM

## 2018-08-04 DIAGNOSIS — I519 Heart disease, unspecified: Secondary | ICD-10-CM | POA: Diagnosis not present

## 2018-08-04 DIAGNOSIS — I428 Other cardiomyopathies: Secondary | ICD-10-CM | POA: Diagnosis not present

## 2018-08-04 DIAGNOSIS — Z95810 Presence of automatic (implantable) cardiac defibrillator: Secondary | ICD-10-CM | POA: Diagnosis not present

## 2018-08-04 NOTE — Progress Notes (Signed)
Larey Dresser, MD  Scarlette Calico, RN        1. LFTs, TSH, CBC check  2. f/u 4 months    Orders placed, AVS sent to pt via mychart

## 2018-08-04 NOTE — Progress Notes (Signed)
EPIC Encounter for ICM Monitoring  Patient Name: Walter [name] is a 73 y.o. male Date: 08/04/2018 Primary Care Physican: London Pepper, MD Primary Cardiologist:McLean Electrophysiologist: Allred Bi-V Pacing:>99% LastWeight:256 - 257lbs 4/17/2020Weight:259 lbs 07/09/2018 Weight: 258 lbs   Attempted call to patient and unable to reach.  Transmission reviewed.   [place] Thoracic impedance normal.  Prescribed:Furosemide20 mgTake 2 tabletd (40 mg total) by mouthdaily.  Labs: 07/21/2018 Creatinine 1.43, BUN 22, Potassium 4.3, Sodium 137, GFR 48-56 01/21/2018 Creatinine 1.13, BUN 16, Potassium 4.0, Sodium 138, GFR >60 05/15/2017 Creatinine 1.53, BUN 24, Potassium 4.0, Sodium 135,GFR >60 05/03/2017 Creatinine 1.08, BUN 16, Potassium 4.3, Sodium 137,GFR >60 01/03/2017 Creatinine 1.27, BUN 19, Potassium 4.4, Sodium 137 03/28/2016 Creatinine 1.26, BUN 18, Potassium 4.8, Sodium 137  Recommendations: Unable to reach.  Follow-up plan: ICM clinic phone appointment on 09/08/2018.  Copy of ICM check sent to Dr.Allredand Dr Aundra Dubin (patient has virtual visit today, 08/04/2018)  3 month ICM trend: 08/01/2018    1 Year ICM trend:       Rosalene Billings, RN 08/04/2018 10:01 AM

## 2018-08-04 NOTE — Progress Notes (Signed)
Heart Failure TeleHealth Note  Due to national recommendations of social distancing due to [place] 19, Audio/video telehealth visit is felt to be most appropriate for this patient at this time.  See MyChart message from today for patient consent regarding telehealth for [facility].  Date:  08/04/2018   ID:  Walter [name], DOB 1945-12-17, MRN [id number]  Location: Home  Provider location: [place] [facility] Type of Visit: Established patient   PCP:  London Pepper, MD  Cardiologist:  Dr. Aundra Dubin  Chief Complaint: Shortness of breath   History of Present Illness: Walter [name] is a 73 y.o. male who presents via audio/video conferencing for a telehealth visit today.     he denies symptoms worrisome for COVID 19.   Patient has history of paroxysmal atrial fibrillation, chronic LBBB and cardiomyopathy.  He was found to have LBBB 15-20 years ago.  In 2011, he had a stress test in [facility] that was abnormal so he was taken for [facility] in 6/11 that showed nonobstructive disease.  He had an echo in 5/11 that showed EF 45-50%, diffuse hypokinesis suggesting a mild nonischemic cardiomyopathy.   He has admitted in 7/15 with atrial fibrillation with RVR.  Rate was very difficult to control.  TEE failed due to inability to pass probe x 2.  Eventually, ICE was done to rule out LAA thrombus and he was cardioverted to NSR.  He is on amiodarone now and in NSR.  Echo in 7/15 showed EF 20% with diffuse hypokinesis in setting of atrial fibrillation/RVR.  LHC was also done in 7/15 with minimal coronary disease.  Repeat echo in 10/15 showed EF 20-25%.  In 11/15, he had St Jude CRT-D device placed.  Repeat echo in 4/16 showed EF improved to 50% with mild LVH. Repeat echo in 11/17 showed EF 50-55%, mildly dilated RV with normal systolic function.  Echo in 12/19 again showed EF 50-55% with moderate LVH.   He has been stable recently.  No significant exertional dyspnea.  No chest pain.  No  palpitations.  Device interrogation has not shown atrial fibrillation.  Still has resting tremor.  No BRBPR/melena.  Weight is down about 6-7 lbs since I last saw him.   St Jude device interrogation: Stable thoracic impedance. .   Labs (11/14): K 3.9, creatinine 1.14, LDL 104, LFTs normal Labs (2/15): HCT 56.3 Labs (9/15): K 4.1, creatinine 1.1, digoxin 0.9, TSH normal, LFTs normal Labs (10/15): TGs 262, LDL 82, BNP 68 Labs (11/15): K 4.1, creatinine 1.12 Labs (12/15): K 4.8, creatinine 1.02 Labs (3/16): K 4.6, creatinine 1.06, digoxin 0.8, LFTs normal, TSH normal Labs (11/16): K 4.2, creatinine 1.17 => 1.3, HCT 56.2, TSH normal, LDL 75, HDL 34, LFTs normal Labs (2/17): K 4.4, creatinine 1.18, BNP 20, HCT 50.8, LFTs normal Labs (12/17): K 4.3, creatinine 1.4 Labs (1/18): K 4.8, creatinine 1.26, hgb 18.1, LFTs normal, TSH normal.  Labs (2/19): K 4, creatinine 1.5 Labs (11/19): LDL 58 Labs (5/20): K 4.3, creatinine 1.43  PMH: 1. Type II diabetes 2. HTN 3. Adrenal nodule 4. Restless leg syndrome 5. OA with right hip pain, right frozen shoulder, low back pain. S/p R THR in 12/16.  S/p right shoulder replacement in 2/19.  6. Testicular cancer: 1972 7. Chronic LBBB: Had Cardiolite in 2011 that was abnormal so had LHC in 6/11 in [place].  This showed 30% LCx stenosis and 40% ostial RCA stenosis.  LHC in 7/15 in [place] showed minimal  coronary disease.  8. 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  Echo (5/11) with EF 45-50%, moderate LVH.  Echo (7/15) with EF 20%, diffuse hypokinesis, mild to moderately decreased RV systolic function (in setting of afib with RVR). LHC (7/15) with minimal coronary disease.  Echo (10/15) with EF 20-25%, mild LVH, moderate LAE. St Jude CRT-D device placed in 11/15. Echo (4/16) with EF 50%, mild LVH.  - Echo (11/17): EF 50-55%, mildly dilated RV with normal systolic function.  - Echo  (12/19): EF 50-55%, moderate LVH, mildly dilated RV with normal systolic function.  9. Factor V Leiden + 10. Asbestosis: Mild.  Exposure while in the [employer].  11. GERD 12. PVCs 13. Polycythemia: Uncertain etiology 14. Hyperlipidemia: Myalgias with Zocor and Lipitor.  15. Atrial fibrillation: Paroxysmal.  Unable to pass TEE probe.   Current Outpatient Medications  Medication Sig Dispense Refill  . albuterol (PROVENTIL HFA;VENTOLIN HFA) 108 (90 Base) MCG/ACT inhaler Inhale 1-2 puffs into the lungs every 6 (six) hours as needed for wheezing or shortness of breath.    Marland Kitchen amiodarone (PACERONE) 200 MG tablet Take 0.5 tablets (100 mg total) by mouth daily. 45 tablet 3  . apixaban (ELIQUIS) 5 MG TABS tablet Take 1 tablet (5 mg total) by mouth 2 (two) times daily. 180 tablet 3  . carvedilol (COREG) 12.5 MG tablet Take 1 tablet (12.5 mg total) by mouth 2 (two) times daily. 180 tablet 3  . cholecalciferol (VITAMIN D) 1000 UNITS tablet Take 1,000 Units by mouth daily.     . furosemide (LASIX) 20 MG tablet Take 2 tablets (40 mg total) by mouth daily. 180 tablet 2  . gabapentin (NEURONTIN) 300 MG capsule Take 300-600 mg by mouth daily as needed.    Marland Kitchen lisinopril (PRINIVIL,ZESTRIL) 10 MG tablet Take 1 tablet (10 mg total) by mouth every evening. 90 tablet 3  . lovastatin (ALTOPREV) 40 MG 24 hr tablet Take 40 mg by mouth at bedtime.    . magnesium oxide (MAG-OX) 400 MG tablet Take 400 mg by mouth daily.     . metFORMIN (GLUCOPHAGE-XR) 500 MG 24 hr tablet Take 1,000 mg by mouth 2 (two) times daily.    Marland Kitchen omega-3 acid ethyl esters (LOVAZA) 1 G capsule Take 2 g by mouth 2 (two) times daily. Morning & afternoon.    Marland Kitchen oxyCODONE-acetaminophen (PERCOCET) 10-325 MG tablet Take 1-2 tablets by mouth every 6 (six) hours as needed (FOR PAIN.). 30 tablet 0  . pantoprazole (PROTONIX) 40 MG tablet Take 40 mg by mouth daily before breakfast.    . rOPINIRole (REQUIP) 0.5 MG tablet Take 1 tablet by mouth daily as needed.     Marland Kitchen rOPINIRole (REQUIP) 1 MG tablet Take 1 mg by mouth 2 (two) times daily. 1600 & BEDTIME    . sildenafil (VIAGRA) 100 MG tablet Take 100 mg by mouth daily as needed for erectile dysfunction.    Marland Kitchen spironolactone (ALDACTONE) 25 MG tablet TAKE 1 TABLET (25 MG) BY MOUTH DAILY IN THE MORNING. 90 tablet 3  . testosterone cypionate (DEPO-TESTOSTERONE) 200 MG/ML injection Inject 200 mg into the muscle See admin instructions. EVERY 10 DAYS    . tiZANidine (ZANAFLEX) 4 MG capsule Take 4 mg by mouth 2 (two) times daily as needed for muscle spasms.     . vitamin B-12 (CYANOCOBALAMIN) 250 MCG tablet Take 250 mcg by mouth daily.     No current facility-administered medications for this encounter.     Allergies:   Statins  Social History:  The patient  reports that he quit smoking about 45 years ago. His smoking use included cigarettes. He has never used smokeless tobacco. He reports current alcohol use. He reports that he does not use drugs.   Family History:  The patient's family history includes Heart attack in his maternal uncle; Heart disease in his father, mother, and paternal uncle; Hypertension in his brother, brother, and mother.   ROS:  Please see the history of present illness.   All other systems are personally reviewed and negative.   Exam:  (Video/Tele Health Call; Exam is subjective and or/visual.) BP 120/60 General:  Speaks in full sentences. No resp difficulty. Neck: No JVD.  Lungs: Normal respiratory effort with conversation.  Abdomen: Non-distended per patient report Extremities: 1+ ankle edema on right.  Neuro: Alert & oriented x 3.   Recent Labs: 01/21/2018: ALT 27; Hemoglobin 17.5; Platelets 182 02/21/2018: TSH 3.613 07/21/2018: BUN 22; Creatinine, Ser 1.43; Potassium 4.3; Sodium 137  Personally reviewed   Wt Readings from Last 3 Encounters:  04/16/18 120.8 kg (266 lb 6.4 oz)  01/21/18 119.9 kg (264 lb 6.4 oz)  05/14/17 117 kg (258 lb)      ASSESSMENT AND PLAN:  1.  Cardiomyopathy: Nonischemic.  There was a pre-existing possible LBBB cardiomyopathy, but EF was down to 20% in the setting of atrial fibrillation with RVR in 7/15 (suggesting tachycardia-mediated cardiomyopathy).  However, EF did not improve in NSR (20-25% on repeat echo in 10/15).  Now with St Jude CRT-D device.  No significant coronary disease on 7/15 LHC.  Since placement of CRT-D, EF has improved to 50-55% (12/19 echo).  NYHA class I-II symptoms.  No JVD or volume overload by Corevue, so suspect peripheral edema is mainly due to venous insufficiency.    - Continue current Coreg, spironolactone, and lisinopril.  Recent BMET is ok.   - I think that he can decrease Lasix from 40 daily to 20 daily, he will need to keep sodium low in his diet.  2. CAD: Nonobstructive, mild.  Continue statin.  No aspirin as he is anticoagulated.  3. Hyperlipidemia: Continue lovastatin.  Myalgias with other statins.   - Good lipids in 11/19.   4. HTN: BP is controlled. 5. Atrial fibrillation: Paroxysmal, now in NSR on amiodarone 100 mg daily => dose decreased due to tremor, which remains present.  He has seen Dr. Jannifer Franklin with neurology about this.  Needs to maintain NSR as possible component of tachy-mediated cardiomyopathy.   - Continue Eliquis, will check CBC.  - Given amiodarone use, check LFTs/TSH. Will need yearly eye exam.  - I offered to send him back to Dr. Jannifer Franklin to re-evaluate his tremor, he wants to wait until he sees me in 4 months to decide on this.   COVID screen The patient does not have any symptoms that suggest any further testing/ screening at this time.  Social distancing reinforced today.  Patient Risk: After full review of this patients clinical status, I feel that they are at moderate risk for cardiac decompensation at this time.  Relevant cardiac medications were reviewed at length with the patient today. The patient does not have concerns regarding their medications at this time.   Recommended  follow-up:  4 months   Today, I have spent 18 minutes with the patient with telehealth technology discussing the above issues .    Signed, Loralie Champagne, MD  08/04/2018   [name] [address] [facility] [place] [address] [phone] (office) [phone] (fax)

## 2018-08-04 NOTE — Patient Instructions (Signed)
Your physician recommends that you return for lab work in: 1 week  Your physician recommends that you schedule a follow-up appointment in: 4 months  Our office will call you to schedule these appointments.  If you have any questions or concerns before your next appointment please send [place] a message through [place] or call our office at [phone].

## 2018-08-04 NOTE — Telephone Encounter (Signed)
Remote ICM transmission received.  Attempted call to patient regarding ICM remote transmission and no answer or message left.  

## 2018-08-05 ENCOUNTER — Telehealth (HOSPITAL_COMMUNITY): Payer: Self-pay | Admitting: Cardiology

## 2018-08-05 NOTE — Telephone Encounter (Signed)
Left message for patient to call back.  Pt needs lab appt in next week or two and then f/u with Dr. Aundra Dubin in 4 months per Kevan Rosebush, RN.

## 2018-08-06 NOTE — Telephone Encounter (Signed)
Left 2nd message for patient to call back to set up lab appt and 4 mo fu w/DM.

## 2018-08-08 NOTE — Telephone Encounter (Signed)
Patient returned my call and is aware of lab appt on 08/13/18 and 4 mo fu w/ Dr. Aundra Dubin on 12/04/18.

## 2018-08-08 NOTE — Telephone Encounter (Signed)
Left 3rd message on pt's cell number since no response from previous two left on home/priority phone number

## 2018-08-13 ENCOUNTER — Ambulatory Visit (HOSPITAL_COMMUNITY)
Admission: RE | Admit: 2018-08-13 | Discharge: 2018-08-13 | Disposition: A | Discharge status: Home / Self Care | Payer: Medicare Other | Source: Ambulatory Visit | Attending: Cardiology | Admitting: Cardiology

## 2018-08-13 ENCOUNTER — Other Ambulatory Visit: Payer: Self-pay

## 2018-08-13 DIAGNOSIS — I428 Other cardiomyopathies: Secondary | ICD-10-CM | POA: Insufficient documentation

## 2018-08-13 DIAGNOSIS — I4819 Other persistent atrial fibrillation: Secondary | ICD-10-CM

## 2018-08-13 LAB — HEPATIC FUNCTION PANEL
ALT: 28 U/L (ref 0–44)
AST: 27 U/L (ref 15–41)
Albumin: 3.7 g/dL (ref 3.5–5.0)
Alkaline Phosphatase: 50 U/L (ref 38–126)
Bilirubin, Direct: 0.2 mg/dL (ref 0.0–0.2)
Indirect Bilirubin: 1 mg/dL — ABNORMAL HIGH (ref 0.3–0.9)
Total Bilirubin: 1.2 mg/dL (ref 0.3–1.2)
Total Protein: 6.3 g/dL — ABNORMAL LOW (ref 6.5–8.1)

## 2018-08-13 LAB — CBC
HCT: 51.5 % (ref 39.0–52.0)
Hemoglobin: 16.8 g/dL (ref 13.0–17.0)
MCH: 32.2 pg (ref 26.0–34.0)
MCHC: 32.6 g/dL (ref 30.0–36.0)
MCV: 98.8 fL (ref 80.0–100.0)
Platelets: 169 10*3/uL (ref 150–400)
RBC: 5.21 MIL/uL (ref 4.22–5.81)
RDW: 13.5 % (ref 11.5–15.5)
WBC: 6 10*3/uL (ref 4.0–10.5)
nRBC: 0 % (ref 0.0–0.2)

## 2018-08-13 LAB — TSH: TSH: 3.922 u[IU]/mL (ref 0.350–4.500)

## 2018-08-14 DIAGNOSIS — Z950 Presence of cardiac pacemaker: Secondary | ICD-10-CM | POA: Diagnosis not present

## 2018-08-14 DIAGNOSIS — E785 Hyperlipidemia, unspecified: Secondary | ICD-10-CM | POA: Diagnosis not present

## 2018-08-14 DIAGNOSIS — I10 Essential (primary) hypertension: Secondary | ICD-10-CM | POA: Diagnosis not present

## 2018-08-14 DIAGNOSIS — I2510 Atherosclerotic heart disease of native coronary artery without angina pectoris: Secondary | ICD-10-CM | POA: Diagnosis not present

## 2018-08-14 DIAGNOSIS — E1169 Type 2 diabetes mellitus with other specified complication: Secondary | ICD-10-CM | POA: Diagnosis not present

## 2018-08-14 DIAGNOSIS — Z7984 Long term (current) use of oral hypoglycemic drugs: Secondary | ICD-10-CM | POA: Diagnosis not present

## 2018-08-14 DIAGNOSIS — I4891 Unspecified atrial fibrillation: Secondary | ICD-10-CM | POA: Diagnosis not present

## 2018-08-14 DIAGNOSIS — G2581 Restless legs syndrome: Secondary | ICD-10-CM | POA: Diagnosis not present

## 2018-08-14 DIAGNOSIS — G8929 Other chronic pain: Secondary | ICD-10-CM | POA: Diagnosis not present

## 2018-08-14 DIAGNOSIS — E291 Testicular hypofunction: Secondary | ICD-10-CM | POA: Diagnosis not present

## 2018-08-18 ENCOUNTER — Ambulatory Visit (INDEPENDENT_AMBULATORY_CARE_PROVIDER_SITE_OTHER): Payer: Medicare Other | Admitting: *Deleted

## 2018-08-18 DIAGNOSIS — I428 Other cardiomyopathies: Secondary | ICD-10-CM

## 2018-08-18 DIAGNOSIS — Z79891 Long term (current) use of opiate analgesic: Secondary | ICD-10-CM | POA: Diagnosis not present

## 2018-08-18 DIAGNOSIS — M25559 Pain in unspecified hip: Secondary | ICD-10-CM | POA: Diagnosis not present

## 2018-08-18 DIAGNOSIS — M47817 Spondylosis without myelopathy or radiculopathy, lumbosacral region: Secondary | ICD-10-CM | POA: Diagnosis not present

## 2018-08-18 DIAGNOSIS — M1710 Unilateral primary osteoarthritis, unspecified knee: Secondary | ICD-10-CM | POA: Diagnosis not present

## 2018-08-18 DIAGNOSIS — Z79899 Other long term (current) drug therapy: Secondary | ICD-10-CM | POA: Diagnosis not present

## 2018-08-18 DIAGNOSIS — G894 Chronic pain syndrome: Secondary | ICD-10-CM | POA: Diagnosis not present

## 2018-08-19 ENCOUNTER — Telehealth: Payer: Self-pay

## 2018-08-19 NOTE — Telephone Encounter (Signed)
Left message for patient to remind of missed remote transmission.  

## 2018-08-20 DIAGNOSIS — G894 Chronic pain syndrome: Secondary | ICD-10-CM | POA: Diagnosis not present

## 2018-08-20 DIAGNOSIS — M1990 Unspecified osteoarthritis, unspecified site: Secondary | ICD-10-CM | POA: Diagnosis not present

## 2018-08-20 DIAGNOSIS — M25559 Pain in unspecified hip: Secondary | ICD-10-CM | POA: Diagnosis not present

## 2018-08-20 LAB — CUP PACEART REMOTE DEVICE CHECK
Battery Remaining Longevity: 31 mo
Battery Remaining Percentage: 38 %
Battery Voltage: 2.89 V
Brady Statistic AP VP Percent: 3.9 %
Brady Statistic AP VS Percent: 1 %
Brady Statistic AS VP Percent: 95 %
Brady Statistic AS VS Percent: 1 %
Brady Statistic RA Percent Paced: 4.3 %
Date Time Interrogation Session: 20200602174416
HighPow Impedance: 74 Ohm
HighPow Impedance: 74 Ohm
Implantable Lead Implant Date: 20151119
Implantable Lead Implant Date: 20151119
Implantable Lead Implant Date: 20151119
Implantable Lead Location: 753858
Implantable Lead Location: 753859
Implantable Lead Location: 753860
Implantable Pulse Generator Implant Date: 20151119
Lead Channel Impedance Value: 350 Ohm
Lead Channel Impedance Value: 430 Ohm
Lead Channel Impedance Value: 560 Ohm
Lead Channel Pacing Threshold Amplitude: 0.5 V
Lead Channel Pacing Threshold Amplitude: 0.75 V
Lead Channel Pacing Threshold Amplitude: 1 V
Lead Channel Pacing Threshold Pulse Width: 0.5 ms
Lead Channel Pacing Threshold Pulse Width: 0.5 ms
Lead Channel Pacing Threshold Pulse Width: 0.5 ms
Lead Channel Sensing Intrinsic Amplitude: 12 mV
Lead Channel Sensing Intrinsic Amplitude: 2.5 mV
Lead Channel Setting Pacing Amplitude: 1.5 V
Lead Channel Setting Pacing Amplitude: 2 V
Lead Channel Setting Pacing Amplitude: 2.5 V
Lead Channel Setting Pacing Pulse Width: 0.5 ms
Lead Channel Setting Pacing Pulse Width: 0.5 ms
Lead Channel Setting Sensing Sensitivity: 0.5 mV
Pulse Gen Serial Number: 7211486

## 2018-08-26 ENCOUNTER — Encounter: Payer: Self-pay | Admitting: Cardiology

## 2018-08-26 NOTE — Progress Notes (Signed)
Remote ICD transmission.   

## 2018-09-04 DIAGNOSIS — Z79899 Other long term (current) drug therapy: Secondary | ICD-10-CM | POA: Diagnosis not present

## 2018-09-04 DIAGNOSIS — Z79891 Long term (current) use of opiate analgesic: Secondary | ICD-10-CM | POA: Diagnosis not present

## 2018-09-04 DIAGNOSIS — G894 Chronic pain syndrome: Secondary | ICD-10-CM | POA: Diagnosis not present

## 2018-09-04 DIAGNOSIS — M47817 Spondylosis without myelopathy or radiculopathy, lumbosacral region: Secondary | ICD-10-CM | POA: Diagnosis not present

## 2018-09-08 ENCOUNTER — Telehealth: Payer: Self-pay

## 2018-09-08 NOTE — Telephone Encounter (Signed)
Left message for patient to remind of missed remote transmission.  

## 2018-09-15 DIAGNOSIS — M25519 Pain in unspecified shoulder: Secondary | ICD-10-CM | POA: Diagnosis not present

## 2018-09-15 DIAGNOSIS — M1710 Unilateral primary osteoarthritis, unspecified knee: Secondary | ICD-10-CM | POA: Diagnosis not present

## 2018-09-15 DIAGNOSIS — M47817 Spondylosis without myelopathy or radiculopathy, lumbosacral region: Secondary | ICD-10-CM | POA: Diagnosis not present

## 2018-09-15 DIAGNOSIS — G894 Chronic pain syndrome: Secondary | ICD-10-CM | POA: Diagnosis not present

## 2018-09-15 NOTE — Progress Notes (Signed)
No ICM remote transmission received for 09/08/2018 and next ICM transmission scheduled for 10/06/2018.

## 2018-10-06 ENCOUNTER — Ambulatory Visit (INDEPENDENT_AMBULATORY_CARE_PROVIDER_SITE_OTHER): Payer: Medicare Other

## 2018-10-06 DIAGNOSIS — Z95810 Presence of automatic (implantable) cardiac defibrillator: Secondary | ICD-10-CM | POA: Diagnosis not present

## 2018-10-06 DIAGNOSIS — I5022 Chronic systolic (congestive) heart failure: Secondary | ICD-10-CM

## 2018-10-07 ENCOUNTER — Telehealth: Payer: Self-pay

## 2018-10-07 DIAGNOSIS — M47817 Spondylosis without myelopathy or radiculopathy, lumbosacral region: Secondary | ICD-10-CM | POA: Diagnosis not present

## 2018-10-07 NOTE — Telephone Encounter (Signed)
Left message for patient to remind of missed remote transmission.  

## 2018-10-08 NOTE — Progress Notes (Signed)
Increase Lasix to 40 mg daily x 5 days then back to 20 mg daily.  Low sodium diet.

## 2018-10-08 NOTE — Progress Notes (Signed)
EPIC Encounter for ICM Monitoring  Patient Name: Walter [name] is a 73 y.o. male Date: 10/08/2018 Primary Care Physican: London Pepper, MD Primary [place] Electrophysiologist: Allred Bi-V Pacing:>99% Weight: unknown  Attempted call to patient and unable to reach.  Left detailed message per DPR regarding transmission and to return call. Transmission reviewed.    Corvue Thoracic impedancesuggesting fluid accumulation since 09/25/2018.  Prescribed:Furosemide20 mgTake 1 tablet (20 mg total) by mouthdaily.  Labs: 01/21/2018 Creatinine 1.13, BUN 16, Potassium 4.0, Sodium 138, GFR >60 05/15/2017 Creatinine 1.53, BUN 24, Potassium 4.0, Sodium 135,GFR >60 05/03/2017 Creatinine 1.08, BUN 16, Potassium 4.3, Sodium 137,GFR >60 01/03/2017 Creatinine 1.27, BUN 19, Potassium 4.4, Sodium 137 03/28/2016 Creatinine 1.26, BUN 18, Potassium 4.8, Sodium 137  Recommendations: Unable to reach.  Follow-up plan: ICM clinic phone appointment on 10/14/2018 (manual send)to recheck fluid levels.  Copy of ICM check sent to Dr.Allredand Dr Rodena Medin review and any further recommendation.  3 month ICM trend: 10/07/2018    1 Year ICM trend:       Rosalene Billings, RN 10/08/2018 5:00 PM

## 2018-10-10 NOTE — Progress Notes (Signed)
Attempted call to patient to provide recommendations and no answer.

## 2018-10-14 ENCOUNTER — Telehealth: Payer: Self-pay

## 2018-10-14 ENCOUNTER — Ambulatory Visit (INDEPENDENT_AMBULATORY_CARE_PROVIDER_SITE_OTHER): Payer: Medicare Other

## 2018-10-14 DIAGNOSIS — Z95810 Presence of automatic (implantable) cardiac defibrillator: Secondary | ICD-10-CM

## 2018-10-14 DIAGNOSIS — I5022 Chronic systolic (congestive) heart failure: Secondary | ICD-10-CM

## 2018-10-14 NOTE — Telephone Encounter (Signed)
LMOVM reminding pt to send remote transmission.   

## 2018-10-15 NOTE — Progress Notes (Signed)
Attempted call on 10/14/2018 to provide recommendations and to request new remote transmission and no answer.

## 2018-10-17 ENCOUNTER — Telehealth: Payer: Self-pay

## 2018-10-17 NOTE — Progress Notes (Signed)
EPIC Encounter for ICM Monitoring  Patient Name: Walter [name] is a 73 y.o. male Date: 10/17/2018 Primary Care Physican: London Pepper, MD Primary [place] Electrophysiologist: Allred Bi-V Pacing:>99% Weight: unknown  Attempted call to patient and unable to reach.  Left detailed message per DPR regarding transmission and to return call. Transmission reviewed.   CorvueThoracic impedancesuggesting fluid accumulation since 09/25/2018.  Prescribed:Furosemide20 mgTake 1 tablet (20 mg total) by mouthdaily.  Labs: 01/21/2018 Creatinine 1.13, BUN 16, Potassium 4.0, Sodium 138, GFR >60 05/15/2017 Creatinine 1.53, BUN 24, Potassium 4.0, Sodium 135,GFR >60 05/03/2017 Creatinine 1.08, BUN 16, Potassium 4.3, Sodium 137,GFR >60 01/03/2017 Creatinine 1.27, BUN 19, Potassium 4.4, Sodium 137 03/28/2016 Creatinine 1.26, BUN 18, Potassium 4.8, Sodium 137  Recommendations: Unable to reach.  Follow-up plan: ICM clinic phone appointment on 11/18/2018  Copy of ICM check sent to Dr.Allred   3 month ICM trend: 10/15/2018    1 Year ICM trend:       Rosalene Billings, RN 10/17/2018 3:35 PM

## 2018-10-17 NOTE — Telephone Encounter (Signed)
Remote ICM transmission received.  Attempted call to patient regarding ICM remote transmission and left etailed message, per DPR, with next ICM remote transmission date of 11/17/2018.  Advised to return call for any fluid symptoms or questions.

## 2018-10-20 ENCOUNTER — Encounter: Payer: Self-pay | Admitting: *Deleted

## 2018-10-20 DIAGNOSIS — E291 Testicular hypofunction: Secondary | ICD-10-CM | POA: Diagnosis not present

## 2018-10-20 DIAGNOSIS — M1612 Unilateral primary osteoarthritis, left hip: Secondary | ICD-10-CM | POA: Diagnosis not present

## 2018-10-20 DIAGNOSIS — M25511 Pain in right shoulder: Secondary | ICD-10-CM | POA: Diagnosis not present

## 2018-10-20 DIAGNOSIS — I10 Essential (primary) hypertension: Secondary | ICD-10-CM | POA: Diagnosis not present

## 2018-10-20 DIAGNOSIS — I4891 Unspecified atrial fibrillation: Secondary | ICD-10-CM | POA: Diagnosis not present

## 2018-10-20 DIAGNOSIS — E785 Hyperlipidemia, unspecified: Secondary | ICD-10-CM | POA: Diagnosis not present

## 2018-10-20 DIAGNOSIS — G2581 Restless legs syndrome: Secondary | ICD-10-CM | POA: Diagnosis not present

## 2018-10-20 DIAGNOSIS — G8929 Other chronic pain: Secondary | ICD-10-CM | POA: Diagnosis not present

## 2018-10-20 DIAGNOSIS — I2510 Atherosclerotic heart disease of native coronary artery without angina pectoris: Secondary | ICD-10-CM | POA: Diagnosis not present

## 2018-10-20 DIAGNOSIS — E1169 Type 2 diabetes mellitus with other specified complication: Secondary | ICD-10-CM | POA: Diagnosis not present

## 2018-10-20 NOTE — Telephone Encounter (Signed)
This encounter was created in error - please disregard.

## 2018-10-21 DIAGNOSIS — M47817 Spondylosis without myelopathy or radiculopathy, lumbosacral region: Secondary | ICD-10-CM | POA: Diagnosis not present

## 2018-10-29 ENCOUNTER — Telehealth (HOSPITAL_COMMUNITY): Payer: Self-pay

## 2018-10-29 NOTE — Telephone Encounter (Signed)
Received cardiac clearance request from [facility].  Completed by Md and faxed back. Confirmation received.

## 2018-11-05 DIAGNOSIS — Z7984 Long term (current) use of oral hypoglycemic drugs: Secondary | ICD-10-CM | POA: Diagnosis not present

## 2018-11-05 DIAGNOSIS — E291 Testicular hypofunction: Secondary | ICD-10-CM | POA: Diagnosis not present

## 2018-11-05 DIAGNOSIS — E1169 Type 2 diabetes mellitus with other specified complication: Secondary | ICD-10-CM | POA: Diagnosis not present

## 2018-11-05 DIAGNOSIS — E785 Hyperlipidemia, unspecified: Secondary | ICD-10-CM | POA: Diagnosis not present

## 2018-11-05 DIAGNOSIS — I4891 Unspecified atrial fibrillation: Secondary | ICD-10-CM | POA: Diagnosis not present

## 2018-11-13 DIAGNOSIS — M25761 Osteophyte, right knee: Secondary | ICD-10-CM | POA: Diagnosis not present

## 2018-11-13 DIAGNOSIS — M79604 Pain in right leg: Secondary | ICD-10-CM | POA: Diagnosis not present

## 2018-11-13 DIAGNOSIS — M25561 Pain in right knee: Secondary | ICD-10-CM | POA: Diagnosis not present

## 2018-11-13 DIAGNOSIS — M1711 Unilateral primary osteoarthritis, right knee: Secondary | ICD-10-CM | POA: Diagnosis not present

## 2018-11-13 DIAGNOSIS — G894 Chronic pain syndrome: Secondary | ICD-10-CM | POA: Diagnosis not present

## 2018-11-13 DIAGNOSIS — M47817 Spondylosis without myelopathy or radiculopathy, lumbosacral region: Secondary | ICD-10-CM | POA: Diagnosis not present

## 2018-11-17 ENCOUNTER — Ambulatory Visit (INDEPENDENT_AMBULATORY_CARE_PROVIDER_SITE_OTHER): Payer: Medicare Other | Admitting: *Deleted

## 2018-11-17 DIAGNOSIS — I428 Other cardiomyopathies: Secondary | ICD-10-CM

## 2018-11-17 DIAGNOSIS — Z23 Encounter for immunization: Secondary | ICD-10-CM | POA: Diagnosis not present

## 2018-11-17 DIAGNOSIS — K219 Gastro-esophageal reflux disease without esophagitis: Secondary | ICD-10-CM | POA: Diagnosis not present

## 2018-11-17 DIAGNOSIS — R110 Nausea: Secondary | ICD-10-CM | POA: Diagnosis not present

## 2018-11-17 DIAGNOSIS — E1169 Type 2 diabetes mellitus with other specified complication: Secondary | ICD-10-CM | POA: Diagnosis not present

## 2018-11-17 LAB — CUP PACEART REMOTE DEVICE CHECK
Battery Remaining Longevity: 29 mo
Battery Remaining Percentage: 35 %
Battery Voltage: 2.89 V
Brady Statistic AP VP Percent: 4.1 %
Brady Statistic AP VS Percent: 1 %
Brady Statistic AS VP Percent: 95 %
Brady Statistic AS VS Percent: 1 %
Brady Statistic RA Percent Paced: 4.5 %
Date Time Interrogation Session: 20200831060017
HighPow Impedance: 75 Ohm
HighPow Impedance: 75 Ohm
Implantable Lead Implant Date: 20151119
Implantable Lead Implant Date: 20151119
Implantable Lead Implant Date: 20151119
Implantable Lead Location: 753858
Implantable Lead Location: 753859
Implantable Lead Location: 753860
Implantable Pulse Generator Implant Date: 20151119
Lead Channel Impedance Value: 360 Ohm
Lead Channel Impedance Value: 430 Ohm
Lead Channel Impedance Value: 550 Ohm
Lead Channel Pacing Threshold Amplitude: 0.5 V
Lead Channel Pacing Threshold Amplitude: 0.75 V
Lead Channel Pacing Threshold Amplitude: 1 V
Lead Channel Pacing Threshold Pulse Width: 0.5 ms
Lead Channel Pacing Threshold Pulse Width: 0.5 ms
Lead Channel Pacing Threshold Pulse Width: 0.5 ms
Lead Channel Sensing Intrinsic Amplitude: 12 mV
Lead Channel Sensing Intrinsic Amplitude: 2.5 mV
Lead Channel Setting Pacing Amplitude: 1.5 V
Lead Channel Setting Pacing Amplitude: 2 V
Lead Channel Setting Pacing Amplitude: 2.5 V
Lead Channel Setting Pacing Pulse Width: 0.5 ms
Lead Channel Setting Pacing Pulse Width: 0.5 ms
Lead Channel Setting Sensing Sensitivity: 0.5 mV
Pulse Gen Serial Number: 7211486

## 2018-11-18 ENCOUNTER — Ambulatory Visit (INDEPENDENT_AMBULATORY_CARE_PROVIDER_SITE_OTHER): Payer: Medicare Other

## 2018-11-18 DIAGNOSIS — Z95810 Presence of automatic (implantable) cardiac defibrillator: Secondary | ICD-10-CM

## 2018-11-18 DIAGNOSIS — I5022 Chronic systolic (congestive) heart failure: Secondary | ICD-10-CM

## 2018-11-21 ENCOUNTER — Telehealth: Payer: Self-pay

## 2018-11-21 NOTE — Telephone Encounter (Signed)
Remote ICM transmission received.  Attempted call to patient regarding ICM remote transmission and left message with wife to return call.

## 2018-11-21 NOTE — Progress Notes (Signed)
EPIC Encounter for ICM Monitoring  Patient Name: Walter [name] is a 73 y.o. male Date: 11/21/2018 Primary Care Physican: London Pepper, MD Primary Cardiologist:[place] Electrophysiologist: Allred Bi-V Pacing:>99% 11/21/2018 Weight: 261 lbs  Spoke with patient. Patient's weight went from 254 lbs to 261 lbs within the last 2 days.  He started new prescription, Jardiance due to increased A1C.  He has not been following low salt diet.   CorvueThoracic impedancesuggesting possible fluid accumulation since 11/18/2018.  Prescribed:Furosemide20 mgTake 1 tablet (20 mg total) by mouthdaily.  Labs: 07/21/2018 Creatinine 1.43, BUN 22, Potassium 4.3, Sodium 137, GFR 48-56 A complete set of results can be found in Results Review.  Recommendations: Reinforced limiting salt intake to < 2000 mg daily.  Advised will call back if Dr Aundra Dubin has any recommendations.   Follow-up plan: ICM clinic phone appointment on 11/25/2018 (manual send) to recheck fluid levels.    Office appt 12/04/2018 with Dr. Aundra Dubin.    Copy of ICM check sent to Dr. Rayann Heman and Dr Aundra Dubin.   3 month ICM trend: 11/21/2018    1 Year ICM trend:       Rosalene Billings, RN 11/21/2018 1:09 PM

## 2018-11-24 NOTE — Progress Notes (Signed)
Increase Lasix to 40 mg daily x 3 days then back to 20 mg daily.  

## 2018-11-25 ENCOUNTER — Ambulatory Visit (INDEPENDENT_AMBULATORY_CARE_PROVIDER_SITE_OTHER): Payer: Medicare Other

## 2018-11-25 DIAGNOSIS — M1711 Unilateral primary osteoarthritis, right knee: Secondary | ICD-10-CM | POA: Diagnosis not present

## 2018-11-25 DIAGNOSIS — Z95810 Presence of automatic (implantable) cardiac defibrillator: Secondary | ICD-10-CM

## 2018-11-25 DIAGNOSIS — I5022 Chronic systolic (congestive) heart failure: Secondary | ICD-10-CM

## 2018-11-25 NOTE — Progress Notes (Signed)
Attempted patient call and left message with wife to have patient call back.

## 2018-11-25 NOTE — Progress Notes (Signed)
See 11/25/2018 ICM note for follow up.

## 2018-11-25 NOTE — Progress Notes (Signed)
EPIC Encounter for ICM Monitoring  Patient Name: Walter [name] is a 73 y.o. male Date: 11/25/2018 Primary Care Physican: London Pepper, MD Primary Cardiologist:[place] Electrophysiologist: Allred Bi-V Pacing:>99% 11/21/2018 Weight: 261 lbs 11/25/2018 Weight: 254 lbs  Spoke with patient. Patients weight dropped to 254 from 261 lbs without taking extra Furosemide.  He thinks the starting of Jardiance has helped with eliminating fluid.   Advised after Dr Aundra Dubin reviewed last weeks report he recommended to take extra Lasix.  I advised I will inform Dr Aundra Dubin that he that impedance and weight have bother returned to baseline and to hold off on taking extra Lasix.   CorvueThoracic impedancereturned to baseline normal.  Prescribed:Furosemide20 mgTake 1 tablet (20 mg total) by mouthdaily.  Labs: 07/21/2018 Creatinine 1.43, BUN 22, Potassium 4.3, Sodium 137, GFR 48-56 A complete set of results can be found in Results Review.  Recommendations: Reinforced limiting salt intake to < 2000 mg daily.  Advised will inform Dr Aundra Dubin extra Lasix was not needed since he is at baseline.  Follow-up plan: ICM clinic phone appointment on 01/12/2019.    Office appt 12/04/2018 with Dr. Aundra Dubin.    Copy of ICM check sent to Dr. Rayann Heman and Dr Aundra Dubin.   3 month ICM trend: 11/25/2018    1 Year ICM trend:       Rosalene Billings, RN 11/25/2018 4:12 PM

## 2018-11-26 ENCOUNTER — Encounter: Payer: Self-pay | Admitting: Cardiology

## 2018-11-26 NOTE — Progress Notes (Signed)
Remote ICD transmission.   

## 2018-12-04 ENCOUNTER — Encounter (HOSPITAL_COMMUNITY): Payer: Self-pay | Admitting: Cardiology

## 2018-12-04 ENCOUNTER — Ambulatory Visit (HOSPITAL_COMMUNITY)
Admission: RE | Admit: 2018-12-04 | Discharge: 2018-12-04 | Disposition: A | Discharge status: Home / Self Care | Payer: Medicare Other | Source: Ambulatory Visit | Attending: Cardiology | Admitting: Cardiology

## 2018-12-04 ENCOUNTER — Other Ambulatory Visit: Payer: Self-pay

## 2018-12-04 VITALS — BP 110/80 | HR 76 | Wt 253.2 lb

## 2018-12-04 DIAGNOSIS — I2510 Atherosclerotic heart disease of native coronary artery without angina pectoris: Secondary | ICD-10-CM | POA: Diagnosis not present

## 2018-12-04 DIAGNOSIS — M791 Myalgia, unspecified site: Secondary | ICD-10-CM | POA: Insufficient documentation

## 2018-12-04 DIAGNOSIS — Z79899 Other long term (current) drug therapy: Secondary | ICD-10-CM | POA: Diagnosis not present

## 2018-12-04 DIAGNOSIS — G2581 Restless legs syndrome: Secondary | ICD-10-CM | POA: Diagnosis not present

## 2018-12-04 DIAGNOSIS — E119 Type 2 diabetes mellitus without complications: Secondary | ICD-10-CM | POA: Insufficient documentation

## 2018-12-04 DIAGNOSIS — K219 Gastro-esophageal reflux disease without esophagitis: Secondary | ICD-10-CM | POA: Insufficient documentation

## 2018-12-04 DIAGNOSIS — Z888 Allergy status to other drugs, medicaments and biological substances status: Secondary | ICD-10-CM | POA: Insufficient documentation

## 2018-12-04 DIAGNOSIS — I110 Hypertensive heart disease with heart failure: Secondary | ICD-10-CM | POA: Diagnosis not present

## 2018-12-04 DIAGNOSIS — I447 Left bundle-branch block, unspecified: Secondary | ICD-10-CM | POA: Diagnosis not present

## 2018-12-04 DIAGNOSIS — Z96611 Presence of right artificial shoulder joint: Secondary | ICD-10-CM | POA: Diagnosis not present

## 2018-12-04 DIAGNOSIS — Z8547 Personal history of malignant neoplasm of testis: Secondary | ICD-10-CM | POA: Diagnosis not present

## 2018-12-04 DIAGNOSIS — I480 Paroxysmal atrial fibrillation: Secondary | ICD-10-CM | POA: Insufficient documentation

## 2018-12-04 DIAGNOSIS — Z87891 Personal history of nicotine dependence: Secondary | ICD-10-CM | POA: Diagnosis not present

## 2018-12-04 DIAGNOSIS — Z8249 Family history of ischemic heart disease and other diseases of the circulatory system: Secondary | ICD-10-CM | POA: Insufficient documentation

## 2018-12-04 DIAGNOSIS — Z950 Presence of cardiac pacemaker: Secondary | ICD-10-CM | POA: Diagnosis not present

## 2018-12-04 DIAGNOSIS — E785 Hyperlipidemia, unspecified: Secondary | ICD-10-CM | POA: Diagnosis not present

## 2018-12-04 DIAGNOSIS — I519 Heart disease, unspecified: Secondary | ICD-10-CM | POA: Diagnosis not present

## 2018-12-04 DIAGNOSIS — I429 Cardiomyopathy, unspecified: Secondary | ICD-10-CM | POA: Diagnosis not present

## 2018-12-04 DIAGNOSIS — M1612 Unilateral primary osteoarthritis, left hip: Secondary | ICD-10-CM | POA: Diagnosis not present

## 2018-12-04 DIAGNOSIS — Z7984 Long term (current) use of oral hypoglycemic drugs: Secondary | ICD-10-CM | POA: Diagnosis not present

## 2018-12-04 DIAGNOSIS — Z7901 Long term (current) use of anticoagulants: Secondary | ICD-10-CM | POA: Insufficient documentation

## 2018-12-04 LAB — COMPREHENSIVE METABOLIC PANEL
ALT: 26 U/L (ref 0–44)
AST: 24 U/L (ref 15–41)
Albumin: 4.1 g/dL (ref 3.5–5.0)
Alkaline Phosphatase: 48 U/L (ref 38–126)
Anion gap: 10 (ref 5–15)
BUN: 14 mg/dL (ref 8–23)
CO2: 25 mmol/L (ref 22–32)
Calcium: 9 mg/dL (ref 8.9–10.3)
Chloride: 101 mmol/L (ref 98–111)
Creatinine, Ser: 1.34 mg/dL — ABNORMAL HIGH (ref 0.61–1.24)
GFR calc Af Amer: >60 mL/min (ref >60)
GFR calc non Af Amer: 52 mL/min — ABNORMAL LOW (ref >60)
Glucose, Bld: 164 mg/dL — ABNORMAL HIGH (ref 70–99)
Potassium: 4.1 mmol/L (ref 3.5–5.1)
Sodium: 136 mmol/L (ref 135–145)
Total Bilirubin: 1.4 mg/dL — ABNORMAL HIGH (ref 0.3–1.2)
Total Protein: 6.6 g/dL (ref 6.5–8.1)

## 2018-12-04 NOTE — Progress Notes (Signed)
Date:  12/04/2018   ID:  Walter [name], DOB May 06, 1945, MRN [id number]   Provider location: [place] [facility] Type of Visit: Established patient   PCP:  London Pepper, MD  Cardiologist:  Dr. Aundra Dubin  Chief Complaint: Shortness of breath   History of Present Illness: Walter [name] is a 73 y.o. male who has a history of paroxysmal atrial fibrillation, chronic LBBB and cardiomyopathy.  He was found to have LBBB 15-20 years ago.  In 2011, he had a stress test in [facility] that was abnormal so he was taken for [facility] in 6/11 that showed nonobstructive disease.  He had an echo in 5/11 that showed EF 45-50%, diffuse hypokinesis suggesting a mild nonischemic cardiomyopathy.   He has admitted in 7/15 with atrial fibrillation with RVR.  Rate was very difficult to control.  TEE failed due to inability to pass probe x 2.  Eventually, ICE was done to rule out LAA thrombus and he was cardioverted to NSR.  He is on amiodarone now and in NSR.  Echo in 7/15 showed EF 20% with diffuse hypokinesis in setting of atrial fibrillation/RVR.  LHC was also done in 7/15 with minimal coronary disease.  Repeat echo in 10/15 showed EF 20-25%.  In 11/15, he had St Jude CRT-D device placed.  Repeat echo in 4/16 showed EF improved to 50% with mild LVH. Repeat echo in 11/17 showed EF 50-55%, mildly dilated RV with normal systolic function.  Echo in 12/19 again showed EF 50-55% with moderate LVH.   He returns for followup of CHF and paroxysmal atrial fibrillation.  Rare palpitations, he is in NSR today.  Breathing generally is ok, takes Lasix 40 mg daily or 20 mg daily depending on his weight.  Recently started on Jardiance and says that his urine output is greater.  Still with a mild tremor.  Weight is down 11 lbs.  No chest pain.  No dyspnea walking on flat ground.  He has left hip pain, plan for eventual THR.    ECG (personally reviewed): NSR, BiV paced   Labs (11/14): K 3.9, creatinine 1.14, LDL 104,  LFTs normal Labs (2/15): HCT 56.3 Labs (9/15): K 4.1, creatinine 1.1, digoxin 0.9, TSH normal, LFTs normal Labs (10/15): TGs 262, LDL 82, BNP 68 Labs (11/15): K 4.1, creatinine 1.12 Labs (12/15): K 4.8, creatinine 1.02 Labs (3/16): K 4.6, creatinine 1.06, digoxin 0.8, LFTs normal, TSH normal Labs (11/16): K 4.2, creatinine 1.17 => 1.3, HCT 56.2, TSH normal, LDL 75, HDL 34, LFTs normal Labs (2/17): K 4.4, creatinine 1.18, BNP 20, HCT 50.8, LFTs normal Labs (12/17): K 4.3, creatinine 1.4 Labs (1/18): K 4.8, creatinine 1.26, hgb 18.1, LFTs normal, TSH normal.  Labs (2/19): K 4, creatinine 1.5 Labs (11/19): LDL 58 Labs (5/20): K 4.3, creatinine 1.43  PMH: 1. Type II diabetes 2. HTN 3. Adrenal nodule 4. Restless leg syndrome 5. OA with right hip pain, right frozen shoulder, low back pain. S/p R THR in 12/16.  S/p right shoulder replacement in 2/19.  6. Testicular cancer: 1972 7. Chronic LBBB: Had Cardiolite in 2011 that was abnormal so had LHC in 6/11 in [place], [place].  This showed 30% LCx stenosis and 40% ostial RCA stenosis.  LHC in 7/15 in [place] showed minimal coronary disease.  8. 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  Echo (5/11) with EF 45-50%, moderate LVH.  Echo (7/15) with EF 20%, diffuse hypokinesis, mild to moderately decreased RV  systolic function (in setting of afib with RVR). LHC (7/15) with minimal coronary disease.  Echo (10/15) with EF 20-25%, mild LVH, moderate LAE. St Jude CRT-D device placed in 11/15. Echo (4/16) with EF 50%, mild LVH.  - Echo (11/17): EF 50-55%, mildly dilated RV with normal systolic function.  - Echo (12/19): EF 50-55%, moderate LVH, mildly dilated RV with normal systolic function.  9. Factor V Leiden + 10. Asbestosis: Mild.  Exposure while in the [employer].  11. GERD 12. PVCs 13. Polycythemia: Uncertain etiology 14. Hyperlipidemia: Myalgias with Zocor and Lipitor.  15.  Atrial fibrillation: Paroxysmal.  Unable to pass TEE probe.   Current Outpatient Medications  Medication Sig Dispense Refill  . albuterol (PROVENTIL HFA;VENTOLIN HFA) 108 (90 Base) MCG/ACT inhaler Inhale 1-2 puffs into the lungs every 6 (six) hours as needed for wheezing or shortness of breath.    Marland Kitchen amiodarone (PACERONE) 200 MG tablet Take 0.5 tablets (100 mg total) by mouth daily. 45 tablet 3  . apixaban (ELIQUIS) 5 MG TABS tablet Take 1 tablet (5 mg total) by mouth 2 (two) times daily. 180 tablet 3  . carvedilol (COREG) 12.5 MG tablet Take 1 tablet (12.5 mg total) by mouth 2 (two) times daily. 180 tablet 3  . esomeprazole (NEXIUM) 40 MG capsule TK 1 C PO QD    . furosemide (LASIX) 20 MG tablet Take 40 mg by mouth as needed.    . gabapentin (NEURONTIN) 300 MG capsule Take 300-600 mg by mouth daily as needed.    Marland Kitchen JARDIANCE 10 MG TABS tablet TK 1 T PO D    . lisinopril (PRINIVIL,ZESTRIL) 10 MG tablet Take 1 tablet (10 mg total) by mouth every evening. 90 tablet 3  . lovastatin (ALTOPREV) 40 MG 24 hr tablet Take 40 mg by mouth 2 (two) times daily.     . metFORMIN (GLUCOPHAGE-XR) 500 MG 24 hr tablet Take 1,000 mg by mouth 2 (two) times daily.    Marland Kitchen omega-3 acid ethyl esters (LOVAZA) 1 G capsule Take 2 g by mouth 2 (two) times daily. Morning & afternoon.    Marland Kitchen oxyCODONE-acetaminophen (PERCOCET) 10-325 MG tablet Take 1-2 tablets by mouth every 6 (six) hours as needed (FOR PAIN.). 30 tablet 0  . rOPINIRole (REQUIP) 0.5 MG tablet Take 1 tablet by mouth daily as needed.    Marland Kitchen rOPINIRole (REQUIP) 1 MG tablet Take 1 mg by mouth 2 (two) times daily. 1600 & BEDTIME    . sildenafil (VIAGRA) 100 MG tablet Take 100 mg by mouth daily as needed for erectile dysfunction.    Marland Kitchen spironolactone (ALDACTONE) 25 MG tablet TAKE 1 TABLET (25 MG) BY MOUTH DAILY IN THE MORNING. 90 tablet 3  . testosterone cypionate (DEPO-TESTOSTERONE) 200 MG/ML injection Inject 200 mg into the muscle See admin instructions. EVERY 10 DAYS     . tiZANidine (ZANAFLEX) 4 MG capsule Take 4 mg by mouth 2 (two) times daily as needed for muscle spasms.      No current facility-administered medications for this encounter.     Allergies:   Statins   Social History:  The patient  reports that he quit smoking about 45 years ago. His smoking use included cigarettes. He has never used smokeless tobacco. He reports current alcohol use. He reports that he does not use drugs.   Family History:  The patient's family history includes Heart attack in his maternal uncle; Heart disease in his father, mother, and paternal uncle; Hypertension in his brother, brother, and mother.  ROS:  Please see the history of present illness.   All other systems are personally reviewed and negative.   Exam:   BP 110/80   Pulse 76   Wt 114.9 kg (253 lb 3.2 oz)   SpO2 96%   BMI 34.34 kg/m  General: NAD Neck: No JVD, no thyromegaly or thyroid nodule.  Lungs: Clear to auscultation bilaterally with normal respiratory effort. CV: Nondisplaced PMI.  Heart regular S1/S2, no S3/S4, no murmur.  Trace ankle edema with venous varicosities.  No carotid bruit.  Normal pedal pulses.  Abdomen: Soft, nontender, no hepatosplenomegaly, no distention.  Skin: Intact without lesions or rashes.  Neurologic: Alert and oriented x 3.  Psych: Normal affect. Extremities: No clubbing or cyanosis.  HEENT: Normal.   Recent Labs: 08/13/2018: Hemoglobin 16.8; Platelets 169; TSH 3.922 12/04/2018: ALT 26; BUN 14; Creatinine, Ser 1.34; Potassium 4.1; Sodium 136  Personally reviewed   Wt Readings from Last 3 Encounters:  12/04/18 114.9 kg (253 lb 3.2 oz)  04/16/18 120.8 kg (266 lb 6.4 oz)  01/21/18 119.9 kg (264 lb 6.4 oz)      ASSESSMENT AND PLAN:  1. Cardiomyopathy: Nonischemic.  There was a pre-existing possible LBBB cardiomyopathy, but EF was down to 20% in the setting of atrial fibrillation with RVR in 7/15 (suggesting tachycardia-mediated cardiomyopathy).  However, EF did not  improve in NSR (20-25% on repeat echo in 10/15).  Now with St Jude CRT-D device.  No significant coronary disease on 7/15 LHC.  Since placement of CRT-D, EF has improved to 50-55% (12/19 echo).  NYHA class II symptoms.  He does not look volume overloaded on exam today.  - Continue current Coreg, spironolactone, and lisinopril.  BMET today.   - Continue Lasix 40 mg daily.  - I will obtain repeat echo at followup in 6 months to make sure that EF has not declined.  2. CAD: Nonobstructive, mild. No chest pain.  Continue statin.  No aspirin as he is anticoagulated.  3. Hyperlipidemia: Continue lovastatin.  Myalgias with other statins.   - Good lipids in 11/19.   4. HTN: BP is controlled. 5. Atrial fibrillation: Paroxysmal, now in NSR on amiodarone 100 mg daily => dose decreased due to tremor, which remains present.  He has seen Dr. Jannifer Franklin with neurology about this.  Needs to maintain NSR as possible component of tachy-mediated cardiomyopathy.   - Continue Eliquis.  - Given amiodarone use, check LFTs and TSH. Will need yearly eye exam.  6. Left hip OA: He is going to need L THR soon.  I think that from a cardiac standpoint, he is of reasonable risk to undergo left THR.   Followup in 6 months with echo.    Signed, Loralie Champagne, MD  12/04/2018   [facility] [address] [facility] [place] [address] [phone] (office) [phone] (fax)

## 2018-12-04 NOTE — Patient Instructions (Addendum)
Labs done today. We will contact you only if your labs are abnormal.  No medication changes. Please continue all of your medications as prescribed.   Your physician recommends that you schedule a follow-up appointment in: 6 months with an Echo prior to your appointment. We will contact you to schedule both of these appointments.   Your physician has requested that you have an echocardiogram in 6 months. Echocardiography is a painless test that uses sound waves to create images of your heart. It provides your doctor with information about the size and shape of your heart and how well your heart's chambers and valves are working. This procedure takes approximately one hour. There are no restrictions for this procedure.We will contact you to schedule this appointment.  At the [facility], you and your health needs are our priority. As part of our continuing mission to provide you with exceptional heart care, we have created designated Provider Care Teams. These Care Teams include your primary Cardiologist (physician) and Advanced Practice Providers (APPs- Physician Assistants and Nurse Practitioners) who all work together to provide you with the care you need, when you need it.   You may see any of the following providers on your designated Care Team at your next follow up: Marland Kitchen Dr Glori Bickers . Dr Loralie Champagne . Darrick Grinder, NP   Please be sure to bring in all your medications bottles to every appointment.

## 2018-12-11 ENCOUNTER — Other Ambulatory Visit (HOSPITAL_COMMUNITY): Payer: Self-pay | Admitting: Cardiology

## 2018-12-11 DIAGNOSIS — M545 Low back pain: Secondary | ICD-10-CM | POA: Diagnosis not present

## 2018-12-11 DIAGNOSIS — M47817 Spondylosis without myelopathy or radiculopathy, lumbosacral region: Secondary | ICD-10-CM | POA: Diagnosis not present

## 2018-12-11 DIAGNOSIS — G894 Chronic pain syndrome: Secondary | ICD-10-CM | POA: Diagnosis not present

## 2019-01-09 DIAGNOSIS — M25559 Pain in unspecified hip: Secondary | ICD-10-CM | POA: Diagnosis not present

## 2019-01-09 DIAGNOSIS — G894 Chronic pain syndrome: Secondary | ICD-10-CM | POA: Diagnosis not present

## 2019-01-09 DIAGNOSIS — M47817 Spondylosis without myelopathy or radiculopathy, lumbosacral region: Secondary | ICD-10-CM | POA: Diagnosis not present

## 2019-01-09 DIAGNOSIS — M1710 Unilateral primary osteoarthritis, unspecified knee: Secondary | ICD-10-CM | POA: Diagnosis not present

## 2019-01-12 ENCOUNTER — Ambulatory Visit (INDEPENDENT_AMBULATORY_CARE_PROVIDER_SITE_OTHER): Payer: Medicare Other

## 2019-01-12 DIAGNOSIS — Z95810 Presence of automatic (implantable) cardiac defibrillator: Secondary | ICD-10-CM | POA: Diagnosis not present

## 2019-01-12 DIAGNOSIS — I519 Heart disease, unspecified: Secondary | ICD-10-CM

## 2019-01-13 ENCOUNTER — Telehealth: Payer: Self-pay

## 2019-01-13 NOTE — Telephone Encounter (Signed)
Left message for patient to remind of missed remote transmission.  

## 2019-01-16 ENCOUNTER — Telehealth: Payer: Self-pay

## 2019-01-16 NOTE — Telephone Encounter (Signed)
Remote ICM transmission received.  Attempted call to patient regarding ICM remote transmission and left detailed message per DPR.  Advised to return call for any fluid symptoms or questions. Next ICM remote transmission scheduled 02/17/2019.

## 2019-01-16 NOTE — Progress Notes (Signed)
EPIC Encounter for ICM Monitoring  Patient Name: Walter [name] is a 73 y.o. male Date: 01/16/2019 Primary Care Physican: London Pepper, MD Primary Cardiologist:McLean Electrophysiologist: Allred Bi-V Pacing: 99% 12/04/2018 Weight: 253 lbs  Attempted call to patient and unable to reach.  Left detailed message per DPR regarding transmission. Transmission reviewed.   CorvueThoracic impedancenormal.  Prescribed:Furosemide20 mgTake 1 tablet (20 mg total) by mouthdaily. Per Dr Aundra Dubin 9/17 office note he should continue Lasix 40 mg daily.   Labs: 12/04/2018 Creatinine 1.34, BUN 14, Potassium 4.1, Sodium 136, GFR 52->60 07/21/2018 Creatinine1.43, BUN22, Potassium4.3, Sodium137, [id number] A complete set of results can be found in Results Review.  Recommendations: Left voice mail with ICM number and encouraged to call if experiencing any fluid symptoms.  Follow-up plan: ICM clinic phone appointment on 02/17/2019.   91 day device clinic remote transmission 02/16/2019.   Copy of ICM check sent to Dr. Rayann Heman.   3 month ICM trend: 01/15/2019    1 Year ICM trend:       Rosalene Billings, RN 01/16/2019 9:18 AM

## 2019-01-22 DIAGNOSIS — R208 Other disturbances of skin sensation: Secondary | ICD-10-CM | POA: Diagnosis not present

## 2019-01-22 DIAGNOSIS — M25559 Pain in unspecified hip: Secondary | ICD-10-CM | POA: Diagnosis not present

## 2019-01-22 DIAGNOSIS — I10 Essential (primary) hypertension: Secondary | ICD-10-CM | POA: Diagnosis not present

## 2019-01-22 DIAGNOSIS — E1169 Type 2 diabetes mellitus with other specified complication: Secondary | ICD-10-CM | POA: Diagnosis not present

## 2019-02-04 ENCOUNTER — Other Ambulatory Visit: Payer: Self-pay

## 2019-02-04 DIAGNOSIS — Z20828 Contact with and (suspected) exposure to other viral communicable diseases: Secondary | ICD-10-CM | POA: Diagnosis not present

## 2019-02-04 DIAGNOSIS — Z20822 Contact with and (suspected) exposure to covid-19: Secondary | ICD-10-CM

## 2019-02-05 LAB — NOVEL CORONAVIRUS, NAA: SARS-CoV-2, NAA: NOT DETECTED

## 2019-02-06 DIAGNOSIS — M1990 Unspecified osteoarthritis, unspecified site: Secondary | ICD-10-CM | POA: Diagnosis not present

## 2019-02-06 DIAGNOSIS — M25559 Pain in unspecified hip: Secondary | ICD-10-CM | POA: Diagnosis not present

## 2019-02-06 DIAGNOSIS — Z79899 Other long term (current) drug therapy: Secondary | ICD-10-CM | POA: Diagnosis not present

## 2019-02-06 DIAGNOSIS — Z79891 Long term (current) use of opiate analgesic: Secondary | ICD-10-CM | POA: Diagnosis not present

## 2019-02-06 DIAGNOSIS — G894 Chronic pain syndrome: Secondary | ICD-10-CM | POA: Diagnosis not present

## 2019-02-10 DIAGNOSIS — R251 Tremor, unspecified: Secondary | ICD-10-CM | POA: Diagnosis not present

## 2019-02-10 DIAGNOSIS — K439 Ventral hernia without obstruction or gangrene: Secondary | ICD-10-CM | POA: Diagnosis not present

## 2019-02-10 DIAGNOSIS — I10 Essential (primary) hypertension: Secondary | ICD-10-CM | POA: Diagnosis not present

## 2019-02-10 DIAGNOSIS — M71551 Other bursitis, not elsewhere classified, right hip: Secondary | ICD-10-CM | POA: Diagnosis not present

## 2019-02-10 DIAGNOSIS — M71552 Other bursitis, not elsewhere classified, left hip: Secondary | ICD-10-CM | POA: Diagnosis not present

## 2019-02-10 DIAGNOSIS — R1084 Generalized abdominal pain: Secondary | ICD-10-CM | POA: Diagnosis not present

## 2019-02-10 DIAGNOSIS — R9089 Other abnormal findings on diagnostic imaging of central nervous system: Secondary | ICD-10-CM | POA: Diagnosis not present

## 2019-02-10 DIAGNOSIS — K760 Fatty (change of) liver, not elsewhere classified: Secondary | ICD-10-CM | POA: Diagnosis not present

## 2019-02-10 DIAGNOSIS — D3501 Benign neoplasm of right adrenal gland: Secondary | ICD-10-CM | POA: Diagnosis not present

## 2019-02-10 DIAGNOSIS — R0902 Hypoxemia: Secondary | ICD-10-CM | POA: Diagnosis not present

## 2019-02-10 DIAGNOSIS — K5730 Diverticulosis of large intestine without perforation or abscess without bleeding: Secondary | ICD-10-CM | POA: Diagnosis not present

## 2019-02-10 DIAGNOSIS — R52 Pain, unspecified: Secondary | ICD-10-CM | POA: Diagnosis not present

## 2019-02-10 DIAGNOSIS — E1165 Type 2 diabetes mellitus with hyperglycemia: Secondary | ICD-10-CM | POA: Diagnosis not present

## 2019-02-10 DIAGNOSIS — R4182 Altered mental status, unspecified: Secondary | ICD-10-CM | POA: Diagnosis not present

## 2019-02-15 LAB — CUP PACEART REMOTE DEVICE CHECK
Battery Remaining Longevity: 26 mo
Battery Remaining Percentage: 32 %
Battery Voltage: 2.89 V
Brady Statistic AP VP Percent: 3.8 %
Brady Statistic AP VS Percent: 1 %
Brady Statistic AS VP Percent: 95 %
Brady Statistic AS VS Percent: 1 %
Brady Statistic RA Percent Paced: 4.2 %
Date Time Interrogation Session: 20201126090820
HighPow Impedance: 80 Ohm
HighPow Impedance: 80 Ohm
Implantable Lead Implant Date: 20151119
Implantable Lead Implant Date: 20151119
Implantable Lead Implant Date: 20151119
Implantable Lead Location: 753858
Implantable Lead Location: 753859
Implantable Lead Location: 753860
Implantable Pulse Generator Implant Date: 20151119
Lead Channel Impedance Value: 390 Ohm
Lead Channel Impedance Value: 480 Ohm
Lead Channel Impedance Value: 560 Ohm
Lead Channel Pacing Threshold Amplitude: 0.5 V
Lead Channel Pacing Threshold Amplitude: 0.75 V
Lead Channel Pacing Threshold Amplitude: 1 V
Lead Channel Pacing Threshold Pulse Width: 0.5 ms
Lead Channel Pacing Threshold Pulse Width: 0.5 ms
Lead Channel Pacing Threshold Pulse Width: 0.5 ms
Lead Channel Sensing Intrinsic Amplitude: 12 mV
Lead Channel Sensing Intrinsic Amplitude: 3.8 mV
Lead Channel Setting Pacing Amplitude: 1.5 V
Lead Channel Setting Pacing Amplitude: 2 V
Lead Channel Setting Pacing Amplitude: 2.5 V
Lead Channel Setting Pacing Pulse Width: 0.5 ms
Lead Channel Setting Pacing Pulse Width: 0.5 ms
Lead Channel Setting Sensing Sensitivity: 0.5 mV
Pulse Gen Serial Number: 7211486

## 2019-02-16 ENCOUNTER — Telehealth: Payer: Self-pay

## 2019-02-16 ENCOUNTER — Ambulatory Visit (INDEPENDENT_AMBULATORY_CARE_PROVIDER_SITE_OTHER): Payer: Medicare Other | Admitting: *Deleted

## 2019-02-16 DIAGNOSIS — I5022 Chronic systolic (congestive) heart failure: Secondary | ICD-10-CM

## 2019-02-16 DIAGNOSIS — I428 Other cardiomyopathies: Secondary | ICD-10-CM | POA: Diagnosis not present

## 2019-02-16 NOTE — Telephone Encounter (Signed)
Remote transmission received and reviewed.  Call back to patient.    CorVue Report:  Advised report suggests possible dryness on 11/28 & 11/29.  Explained report showing dryness can possibly be related to dehydration and symptoms could include confusion or cognitive changes.  Meds: He confirms he is taking Furosemide 20 mg daily.  Denies medication changes. He has been taking diabetic medication Jardiance since 8/31 but has not had any side effects.   Recommendation: Advised to increase fluid intake today and hold Furosemide dosage today.  He should resume prescribed Furosemide dosage tomorrow.    Pt Requests: He requested Dr Claris Gladden office obtain [facility] ER records for review.    Advised will send copy of this note to Dr Aundra Dubin for review and if any further recommendations will call him back.    02/16/2019 Remote Transmission:

## 2019-02-16 NOTE — Telephone Encounter (Signed)
Attempted ICM call to patient regarding Dr Claris Gladden recommendations and left message for return call.

## 2019-02-16 NOTE — Telephone Encounter (Signed)
Would have him drop Lasix to 20 mg every other day.  Needs BMET done.

## 2019-02-16 NOTE — Telephone Encounter (Signed)
Returned call to patient as requested per voice mail left on 02/13/2019 when office was closed for holiday.  Patient reports having a buzz feeling around device on 02/12/2019 that only lasted a few seconds. He reports that was the only sx and denies any chest pain, shortness of breath, dizziness or fainting.  He reports after shopping on Saturday he developed personality change with memory problems and went to [facility] ER Saturday night. All tests were normal including CT scan of head and was released same evening.  He feels fine today and has a follow up office appointment with PCP this week.  Patient sent remote transmission on 11/26 during closed office hours and reviewed today.  Device report does not identify any abnormalities.  He will send another report today for review.

## 2019-02-17 ENCOUNTER — Ambulatory Visit (INDEPENDENT_AMBULATORY_CARE_PROVIDER_SITE_OTHER): Payer: Medicare Other

## 2019-02-17 DIAGNOSIS — Z95810 Presence of automatic (implantable) cardiac defibrillator: Secondary | ICD-10-CM

## 2019-02-17 DIAGNOSIS — I5022 Chronic systolic (congestive) heart failure: Secondary | ICD-10-CM | POA: Diagnosis not present

## 2019-02-17 MED ORDER — FUROSEMIDE 20 MG PO TABS: 20.0000 mg | ORAL_TABLET | ORAL | 3 refills | Status: DC

## 2019-02-17 NOTE — Telephone Encounter (Signed)
Spoke with patient and advised Dr Aundra Dubin recommended to take Lasix 20 mg every other day and needs labs drawn next week.  BMET scheduled for 02/26/2019 at 9:30AM at HF clinic.  [facility] and does not need a refill at this time.  Recheck remote transmission on 02/25/2019.   He is agreeable to plan.

## 2019-02-18 NOTE — Progress Notes (Signed)
EPIC Encounter for ICM Monitoring  Patient Name: Walter [name] is a 73 y.o. male Date: 02/18/2019 Primary Care Physican: London Pepper, MD Primary Cardiologist:McLean Electrophysiologist: Allred Bi-V Pacing:>99% 9/4/2020Weight:261 lbs 11/25/2018 Weight: 254 lbs      Spoke with patient on 02/16/2019.  Patient reports having a buzz feeling around device on 02/12/2019 that only lasted a few seconds. He reports that was the only sx and denies any chest pain, shortness of breath, dizziness or fainting.  He reports after shopping on Saturday he developed personality change with memory problems and went to [facility] ER Saturday night. All tests were normal including CT scan of head and was released same evening.  He feels fine today and has a follow up office appointment with PCP this week.  Patient sent remote transmission on 11/26 during closed office hours and reviewed today.  Device report does not identify any abnormalities.  He will send another report today for review.     CorVue thoracic impedance suggests possible dryness on 11/28 & 11/29.    Furosemide20 mgTake 1 tablet (20 mg total) by every other day (decreased on 02/16/2019).  Labs: 12/04/2018 Creatinine 1.34, BUN 14, Potassium 4.1, Sodium 136, GFR 52->60 07/21/2018 Creatinine1.43, BUN22, Potassium4.3, Sodium137, [id number] A complete set of results can be found in Results Review.  Recommendations:Dr McLean decreased Furosemide (See ICM call on 02/16/2019) and ordered BMET for 02/26/2019  Follow-up plan: ICM clinic phone appointment on12/11/2018.  Copy of ICM check sent to Dr.Allred.  Phone note was routed to Dr Aundra Dubin on 02/16/2019 and recommendation above were given.   3 month ICM trend: 02/16/2019    1 Year ICM trend:       Rosalene Billings, RN 02/18/2019 3:43 PM

## 2019-02-25 ENCOUNTER — Telehealth: Payer: Self-pay

## 2019-02-25 ENCOUNTER — Ambulatory Visit (INDEPENDENT_AMBULATORY_CARE_PROVIDER_SITE_OTHER): Payer: Medicare Other

## 2019-02-25 DIAGNOSIS — I5022 Chronic systolic (congestive) heart failure: Secondary | ICD-10-CM

## 2019-02-25 DIAGNOSIS — Z95810 Presence of automatic (implantable) cardiac defibrillator: Secondary | ICD-10-CM

## 2019-02-25 NOTE — Progress Notes (Signed)
Spoke with patient. He reports he stopped Lasix on 12/4 due to he went out of town on 12/5.   This would explain why Corvue is showing possible fluid accumulation since 12/5.  Today's weight is 244 lbs.    Advised patient to take Lasix consistently to be able to assess the effectiveness of Lasix.  Advised I would inform Dr Aundra Dubin the reason for possible show of fluid is due to he stopped Lasix on 12/4.  He says he will resume Lasix tomorrow and take every other day as prescribed.  Scheduled for BMET 02/26/2019 at HF clinic.

## 2019-02-25 NOTE — Telephone Encounter (Signed)
Remote ICM transmission received.  Attempted call to patient regarding ICM remote transmission and left detailed message per DPR to return call.   

## 2019-02-25 NOTE — Progress Notes (Signed)
EPIC Encounter for ICM Monitoring  Patient Name: Walter [name] is a 73 y.o. male Date: 02/25/2019 Primary Care Physican: London Pepper, MD Primary Cardiologist:McLean Electrophysiologist: Allred Bi-V Pacing:98% 12/04/2018 Weight: 253 lbs                                                    Attempted call to patient and unable to reach.  Left detailed message per DPR regarding transmission and to return call. Transmission reviewed.    CorVue thoracic impedance suggests a change from possible dryness (11/30 report) to possible fluid accumulation since 02/21/2019.  Furosemide20 mgTake 1 tablet (20 mg total) by every other day (decreased on 02/16/2019).  Labs:  BMET scheduled for 02/26/2019 12/04/2018 Creatinine 1.34, BUN 14, Potassium 4.1, Sodium 136, GFR 52->60 07/21/2018 Creatinine1.43, BUN22, Potassium4.3, Sodium137, [id number] A complete set of results can be found in Results Review.  Recommendations: Unable to reach.    Follow-up plan: ICM clinic phone appointment on12/17/2020 to recheck fluid levels.  Copy of ICM check sent to Dr.Allred.    3 month ICM trend: 02/25/2019    1 Year ICM trend:       Rosalene Billings, RN 02/25/2019 2:43 PM

## 2019-02-26 ENCOUNTER — Ambulatory Visit (HOSPITAL_COMMUNITY)
Admission: RE | Admit: 2019-02-26 | Discharge: 2019-02-26 | Disposition: A | Discharge status: Home / Self Care | Payer: Medicare Other | Source: Ambulatory Visit | Attending: Cardiology | Admitting: Cardiology

## 2019-02-26 ENCOUNTER — Other Ambulatory Visit: Payer: Self-pay

## 2019-02-26 DIAGNOSIS — E1169 Type 2 diabetes mellitus with other specified complication: Secondary | ICD-10-CM | POA: Diagnosis not present

## 2019-02-26 DIAGNOSIS — R4182 Altered mental status, unspecified: Secondary | ICD-10-CM | POA: Diagnosis not present

## 2019-02-26 DIAGNOSIS — M25559 Pain in unspecified hip: Secondary | ICD-10-CM | POA: Diagnosis not present

## 2019-02-26 DIAGNOSIS — I5022 Chronic systolic (congestive) heart failure: Secondary | ICD-10-CM | POA: Diagnosis not present

## 2019-02-26 DIAGNOSIS — E538 Deficiency of other specified B group vitamins: Secondary | ICD-10-CM | POA: Diagnosis not present

## 2019-02-26 LAB — BASIC METABOLIC PANEL
Anion gap: 10 (ref 5–15)
BUN: 15 mg/dL (ref 8–23)
CO2: 27 mmol/L (ref 22–32)
Calcium: 8.7 mg/dL — ABNORMAL LOW (ref 8.9–10.3)
Chloride: 103 mmol/L (ref 98–111)
Creatinine, Ser: 1.18 mg/dL (ref 0.61–1.24)
GFR calc Af Amer: >60 mL/min (ref >60)
GFR calc non Af Amer: >60 mL/min (ref >60)
Glucose, Bld: 128 mg/dL — ABNORMAL HIGH (ref 70–99)
Potassium: 4.2 mmol/L (ref 3.5–5.1)
Sodium: 140 mmol/L (ref 135–145)

## 2019-03-09 ENCOUNTER — Other Ambulatory Visit (HOSPITAL_COMMUNITY): Payer: Self-pay | Admitting: Cardiology

## 2019-03-09 DIAGNOSIS — G894 Chronic pain syndrome: Secondary | ICD-10-CM | POA: Diagnosis not present

## 2019-03-09 DIAGNOSIS — I428 Other cardiomyopathies: Secondary | ICD-10-CM

## 2019-03-09 DIAGNOSIS — M25559 Pain in unspecified hip: Secondary | ICD-10-CM | POA: Diagnosis not present

## 2019-03-09 DIAGNOSIS — I519 Heart disease, unspecified: Secondary | ICD-10-CM

## 2019-03-09 DIAGNOSIS — M1990 Unspecified osteoarthritis, unspecified site: Secondary | ICD-10-CM | POA: Diagnosis not present

## 2019-03-09 DIAGNOSIS — Z95810 Presence of automatic (implantable) cardiac defibrillator: Secondary | ICD-10-CM

## 2019-03-09 DIAGNOSIS — M47817 Spondylosis without myelopathy or radiculopathy, lumbosacral region: Secondary | ICD-10-CM | POA: Diagnosis not present

## 2019-03-09 DIAGNOSIS — I480 Paroxysmal atrial fibrillation: Secondary | ICD-10-CM

## 2019-03-11 NOTE — Progress Notes (Signed)
ICD remote 

## 2019-03-11 NOTE — Progress Notes (Signed)
No ICM remote transmission received for 03/05/2019 and next ICM transmission scheduled for 04/13/2019.

## 2019-03-29 ENCOUNTER — Ambulatory Visit: Payer: Medicare Other | Attending: Internal Medicine

## 2019-03-29 DIAGNOSIS — Z23 Encounter for immunization: Secondary | ICD-10-CM

## 2019-03-29 NOTE — Progress Notes (Signed)
   Covid-19 Vaccination Clinic  Name:  Walter [name]    MRN: [id number] DOB: 30-Aug-1945  03/29/2019  Walter [name] was observed post Covid-19 immunization for 30 minutes based on pre-vaccination screening without incidence. He was provided with Vaccine Information Sheet and instruction to access the V-Safe system.   Walter [name] was instructed to call 911 with any severe reactions post vaccine: Marland Kitchen Difficulty breathing  . Swelling of your face and throat  . A fast heartbeat  . A bad rash all over your body  . Dizziness and weakness    Immunizations Administered    Name Date Dose VIS Date Route   Pfizer COVID-19 Vaccine 03/29/2019 11:16 AM 0.3 mL 02/27/2019 Intramuscular   Manufacturer: [employer]   Lot: [id number]   [place]: [id number]

## 2019-04-01 ENCOUNTER — Ambulatory Visit: Payer: Medicare Other

## 2019-04-07 DIAGNOSIS — G894 Chronic pain syndrome: Secondary | ICD-10-CM | POA: Diagnosis not present

## 2019-04-07 DIAGNOSIS — Z79899 Other long term (current) drug therapy: Secondary | ICD-10-CM | POA: Diagnosis not present

## 2019-04-07 DIAGNOSIS — Z79891 Long term (current) use of opiate analgesic: Secondary | ICD-10-CM | POA: Diagnosis not present

## 2019-04-07 DIAGNOSIS — M47817 Spondylosis without myelopathy or radiculopathy, lumbosacral region: Secondary | ICD-10-CM | POA: Diagnosis not present

## 2019-04-07 DIAGNOSIS — M1990 Unspecified osteoarthritis, unspecified site: Secondary | ICD-10-CM | POA: Diagnosis not present

## 2019-04-07 DIAGNOSIS — M25559 Pain in unspecified hip: Secondary | ICD-10-CM | POA: Diagnosis not present

## 2019-04-13 ENCOUNTER — Ambulatory Visit (INDEPENDENT_AMBULATORY_CARE_PROVIDER_SITE_OTHER): Payer: Medicare Other

## 2019-04-13 DIAGNOSIS — Z95810 Presence of automatic (implantable) cardiac defibrillator: Secondary | ICD-10-CM | POA: Diagnosis not present

## 2019-04-13 DIAGNOSIS — I5022 Chronic systolic (congestive) heart failure: Secondary | ICD-10-CM

## 2019-04-17 ENCOUNTER — Ambulatory Visit: Payer: Medicare Other

## 2019-04-17 NOTE — Progress Notes (Signed)
EPIC Encounter for ICM Monitoring  Patient Name: Walter [name] is a 74 y.o. male Date: 04/17/2019 Primary Care Physican: London Pepper, MD Primary Cardiologist:McLean Electrophysiologist: Allred Bi-V Pacing:98% Last [id number] lbs                                          Transmission reviewed.   CorVue thoracic impedance normal.  Furosemide20 mgTake 1 tablet (20 mg total) by mouth every other day.   Labs: 02/26/2019 Creatinine 1.18, BUN 15, Potassium 4.2, Sodium 140, GFR >60 12/04/2018 Creatinine 1.34, BUN 14, Potassium 4.1, Sodium 136, GFR 52->60 07/21/2018 Creatinine1.43, BUN22, Potassium4.3, Sodium137, [id number] A complete set of results can be found in Results Review.  Recommendations:  None  Follow-up plan: ICM clinic phone appointment on 05/19/2019.  91 day device clinic remote transmission 05/18/2019.  Office appt 06/04/2019 with Chanetta Marshall, NP.    Copy of ICM check sent to Dr. Rayann Heman.   3 month ICM trend: 04/13/2019    1 Year ICM trend:       Rosalene Billings, RN 04/17/2019 3:14 PM

## 2019-04-18 ENCOUNTER — Ambulatory Visit: Payer: Medicare Other | Attending: Internal Medicine

## 2019-04-18 ENCOUNTER — Ambulatory Visit: Payer: Medicare Other

## 2019-04-18 DIAGNOSIS — Z23 Encounter for immunization: Secondary | ICD-10-CM

## 2019-04-18 NOTE — Progress Notes (Signed)
   Covid-19 Vaccination Clinic  Name:  Walter [name]    MRN: [id number] DOB: 1945-08-06  04/18/2019  [name] was observed post Covid-19 immunization for 15 minutes without incidence. He was provided with Vaccine Information Sheet and instruction to access the V-Safe system.   [name] was instructed to call 911 with any severe reactions post vaccine: Marland Kitchen Difficulty breathing  . Swelling of your face and throat  . A fast heartbeat  . A bad rash all over your body  . Dizziness and weakness    Immunizations Administered    Name Date Dose VIS Date Route   Pfizer COVID-19 Vaccine 04/18/2019  1:47 PM 0.3 mL 02/27/2019 Intramuscular   Manufacturer: [place]   Lot: [id number]   [place]: [id number]

## 2019-04-20 DIAGNOSIS — E1169 Type 2 diabetes mellitus with other specified complication: Secondary | ICD-10-CM | POA: Diagnosis not present

## 2019-04-28 DIAGNOSIS — M47817 Spondylosis without myelopathy or radiculopathy, lumbosacral region: Secondary | ICD-10-CM | POA: Diagnosis not present

## 2019-04-29 ENCOUNTER — Other Ambulatory Visit (HOSPITAL_COMMUNITY): Payer: Self-pay | Admitting: Cardiology

## 2019-04-29 DIAGNOSIS — I519 Heart disease, unspecified: Secondary | ICD-10-CM

## 2019-05-06 ENCOUNTER — Ambulatory Visit (INDEPENDENT_AMBULATORY_CARE_PROVIDER_SITE_OTHER): Payer: Medicare Other | Admitting: Neurology

## 2019-05-06 ENCOUNTER — Other Ambulatory Visit: Payer: Self-pay

## 2019-05-06 ENCOUNTER — Encounter: Payer: Self-pay | Admitting: Neurology

## 2019-05-06 VITALS — BP 137/79 | HR 71 | Temp 97.3°F | Ht 72.0 in | Wt 252.0 lb

## 2019-05-06 DIAGNOSIS — R410 Disorientation, unspecified: Secondary | ICD-10-CM

## 2019-05-06 DIAGNOSIS — G250 Essential tremor: Secondary | ICD-10-CM

## 2019-05-06 HISTORY — DX: Disorientation, unspecified: R41.0

## 2019-05-06 NOTE — Progress Notes (Signed)
Reason for visit: Altered mental status, delirium  Referring physician: Dr. Cristela Blue is a 74 y.o. male  History of present illness:  Walter [name] is a 74 year old left-handed white male with a history of an essential tremor. Walter [name] was seen here about 4 years ago for Walter tremor, he opted not to go on any medications at that time. Walter [name] comes into Walter office today with a new problem that occurred in November 2020. Walter [name] was seen through Walter emergency room on 10 February 2019 with a 3-day history of a delirium state. Walter [name] indicates that he has degenerative arthritis of Walter left hip, he underwent a steroid injection into Walter hip and within several hours he began to have onset of confusion. He has no recollection of events for several days. He apparently started having problems with increasing confusion, he was hallucinating, he was agitated. Walter [name] was not sleeping well. Eventually, Walter wife got him to Walter emergency room, they gave him sedative medications and he was able to sleep, and Walter delirium resolved. Walter [name] has never had similar events previously. He has had cortisone injections previously that did not result in confusion. Walter [name] reports no pre-existing history of memory problems, his cognitive capacity has returned to normal following Walter delirium. He has no residual from this. He has a pacemaker in place, he cannot have an MRI of Walter brain, a CT scan of Walter head was done and was unremarkable. Walter [name] reports no other medication changes. He did not check his blood sugar when he became confused after Walter steroid injection. He was managing his own medications apparently while he was confused for Walter 3 days prior to going to Walter hospital. Walter [name] reports no focal numbness or weakness of Walter face, arms, legs. Walter [name] does have some walking problems in part related to Walter severe arthritis of Walter left hip. He has not had any falls. He  denies issues controlling Walter bowels or Walter bladder. He reports no headaches or dizziness or vision changes or changes in speech or swallowing. He continues to have Walter essential tremor, feeding himself and handwriting are difficult. He is on Requip for restless leg syndrome and gabapentin as well.  Past Medical History:  Diagnosis Date  . AICD (automatic cardioverter/defibrillator) present   . Anxiety   . Asbestosis ([place])    mild  . Atrial fibrillation ([place])   . Chronic low back pain    /sciatica- Dr Letta Median [facility])  . Complication of anesthesia 1996   previous surgery had a run of v-tach  . Coronary artery disease, non-occlusive June 2011   Cardiac cath in [facility] following abnormal Myoview  . DJD (degenerative joint disease)    /osteoarthritis  . Dyslipidemia, goal LDL below 100    On lovastatin (myalgias with Zocor and Lipitor)  . Dyspnea    worse in hot weather  . Dysrhythmia   . Essential hypertension   . Factor V Leiden ([place])   . Frozen shoulder    right  . GERD (gastroesophageal reflux disease)   . History of kidney stones   . Hypogonadism male   . Irritable bowel   . Kidney stone   . Left bundle branch block (LBBB) on electrocardiogram    Diagnosed close to 20 years ago  . Myocardial infarction ([place])   . Non-ischemic cardiomyopathy [facility]) June 2011   EF 45-50%; suspect related to LBBB; repeat Echo March  2015: EF 50- 55%  . Obesity   . PONV (postoperative nausea and vomiting)   . Restless leg syndrome   . Rotator cuff arthropathy of right shoulder 05/14/2017  . Testicular cancer ([place]) 1972   left  . Tremor, essential 12/09/2015  . Type 2 diabetes mellitus without complication ([place]) [id number]   Type 2.     Past Surgical History:  Procedure Laterality Date  . APPENDECTOMY N/A   . BI-VENTRICULAR IMPLANTABLE CARDIOVERTER DEFIBRILLATOR N/A 02/04/2014   SJM Quadra Assura BiV ICD implanted for primary prevention by [name]  . CARDIAC  CATHETERIZATION  June 2011   Nonobstructive CAD  . CARDIAC DEFIBRILLATOR PLACEMENT  02/04/14   [facility] model [place] 3365-40Q (serial Number [id number]) biventricular ICD.  Marland Kitchen CARDIOVERSION N/A 10/13/2013   Procedure: CARDIOVERSION;  Surgeon: Larey Dresser, MD;  Location: [place];  Service: Cardiovascular;  Laterality: N/A;  . CARDIOVERSION  10-16-2013   DCCV by [name] following intracardiac echo to rule out LAA thrombus  . CARDIOVERSION N/A 10/16/2013   Procedure: CARDIOVERSION;  Surgeon: Coralyn Mark, MD;  Location: [facility] CATH LAB;  Service: Cardiovascular;  Laterality: N/A;  . CHOLECYSTECTOMY OPEN    . ELECTROPHYSIOLOGY STUDY N/A 10/16/2013   Procedure: ELECTROPHYSIOLOGY STUDY;  Surgeon: Coralyn Mark, MD;  Location: [facility] CATH LAB;  Service: Cardiovascular;  Laterality: N/A;  . ESOPHAGOGASTRODUODENOSCOPY N/A 10/15/2013   Procedure: ESOPHAGOGASTRODUODENOSCOPY (EGD);  Surgeon: Gatha Mayer, MD;  Location: [facility] ENDOSCOPY;  Service: Endoscopy;  Laterality: N/A;  bedside  . HERNIA REPAIR    . KNEE ARTHROSCOPY Left   . LEFT AND RIGHT HEART CATHETERIZATION WITH CORONARY ANGIOGRAM N/A 10/12/2013   Procedure: LEFT AND RIGHT HEART CATHETERIZATION WITH CORONARY ANGIOGRAM;  Surgeon: Troy Sine, MD;  Location: [facility] CATH LAB;  Service: Cardiovascular;  Laterality: N/A;  . lymph nodes    . RADIOFREQUENCY ABLATION NERVES     Back every 6 months  . REVERSE SHOULDER ARTHROPLASTY Right 05/14/2017   Procedure: TOTAL REVERSE SHOULDER ARTHROPLASTY;  Surgeon: Marchia Bond, MD;  Location: [place];  Service: Orthopedics;  Laterality: Right;  . ROTATOR CUFF REPAIR Right   . SURGERY SCROTAL / TESTICULAR Left   . TONSILLECTOMY Bilateral   . TOTAL HIP ARTHROPLASTY Right 02/22/2015   Procedure: RIGHT TOTAL HIP ARTHROPLASTY ANTERIOR APPROACH;  Surgeon: Renette Butters, MD;  Location: [place];  Service: Orthopedics;  Laterality: Right;  . TRANSTHORACIC ECHOCARDIOGRAM  May 2011   EF 45% with diffuse  hypokinesis; septal bounce from LBBB  . TRANSTHORACIC ECHOCARDIOGRAM  March 2015   EF 50-55%. Mild concentric LVH. Septal dyssynergy from LBBB     Family History  Problem Relation Age of Onset  . Hypertension Mother   . Heart disease Mother   . Heart disease Father   . Hypertension Brother   . Heart attack Maternal Uncle   . Hypertension Brother   . Heart disease Paternal Uncle     Social history:  reports that he quit smoking about 46 years ago. His smoking use included cigarettes. He has never used smokeless tobacco. He reports current alcohol use. He reports that he does not use drugs.  Medications:  Prior to Admission medications   Medication Sig Start Date End Date Taking? Authorizing Provider  albuterol (PROVENTIL HFA;VENTOLIN HFA) 108 (90 Base) MCG/ACT inhaler Inhale 1-2 puffs into Walter lungs every 6 (six) hours as needed for wheezing or shortness of breath.   Yes [provider]  amiodarone (PACERONE) 200  MG tablet TAKE ONE-HALF (1/2) TABLET DAILY 03/10/19  Yes Larey Dresser, MD  apixaban (ELIQUIS) 5 MG TABS tablet Take by mouth.   Yes [provider]  carvedilol (COREG) 12.5 MG tablet TAKE 1 TABLET TWICE A DAY 04/29/19  Yes Larey Dresser, MD  empagliflozin (JARDIANCE) 10 MG TABS tablet Take 25 mg by mouth.    Yes [provider]  esomeprazole (NEXIUM) 40 MG capsule TK 1 C PO QD 11/19/18  Yes [provider]  furosemide (LASIX) 20 MG tablet Take 1 tablet (20 mg total) by mouth every other day. 02/17/19  Yes Larey Dresser, MD  gabapentin (NEURONTIN) 300 MG capsule Take 300-600 mg by mouth daily as needed.   Yes [provider]  lisinopril (ZESTRIL) 10 MG tablet Take by mouth.   Yes [provider]  lovastatin (ALTOPREV) 40 MG 24 hr tablet Take 40 mg by mouth 2 (two) times daily.    Yes [provider]  lovastatin (MEVACOR) 40 MG tablet Take by mouth.   Yes [provider]  metFORMIN (GLUCOPHAGE) 500 MG  tablet Take by mouth 2 (two) times daily with a meal.    Yes [provider]  omega-3 acid ethyl esters (LOVAZA) 1 G capsule Take 2 g by mouth 2 (two) times daily. Morning & afternoon.   Yes [provider]  oxyCODONE-acetaminophen (PERCOCET) 10-325 MG tablet Take 1-2 tablets by mouth every 6 (six) hours as needed (FOR PAIN.). 05/14/17  Yes Marchia Bond, MD  rOPINIRole (REQUIP) 0.5 MG tablet Take 1 tablet by mouth daily as needed. 03/03/18  Yes [provider]  rOPINIRole (REQUIP) 1 MG tablet Take 1 mg by mouth 2 (two) times daily. 1600 & BEDTIME 11/28/15  Yes [provider]  sildenafil (VIAGRA) 100 MG tablet Take 100 mg by mouth daily as needed for erectile dysfunction.   Yes [provider]  spironolactone (ALDACTONE) 25 MG tablet TAKE 1 TABLET DAILY IN Walter MORNING 03/10/19  Yes Larey Dresser, MD  testosterone cypionate (DEPO-TESTOSTERONE) 200 MG/ML injection Inject 200 mg into Walter muscle See admin instructions. EVERY 10 DAYS   Yes [provider]  tiZANidine (ZANAFLEX) 4 MG capsule Take 4 mg by mouth 2 (two) times daily as needed for muscle spasms.    Yes [provider]  VITAMIN D PO Take 500 mg by mouth.   Yes [provider]      Allergies  Allergen Reactions  . Statins Other (See Comments)    Myalgias.  Can only take Altoprev   . Other Itching and Rash    ROS:  Out of a complete 14 system review of symptoms, Walter [name] complains only of Walter following symptoms, and all other reviewed systems are negative.  Transient confusion left hip pain walking difficulty   Blood pressure 137/79, pulse 71, temperature (!) 97.3 F (36.3 C), height 6' (1.829 m), weight 252 lb (114.3 kg).  Physical Exam  General: Walter [name] is alert and cooperative at Walter time of Walter examination.  Eyes: Pupils are equal, round, and reactive to light. Discs are flat bilaterally.  Neck: Walter neck is supple, no carotid bruits are  noted.  Respiratory: Walter respiratory examination is clear.  Cardiovascular: Walter cardiovascular examination reveals a regular rate and rhythm, no obvious murmurs or rubs are noted.  Skin: Extremities are with 2+ edema below Walter knee on Walter right, 1+ on Walter left.  Neurologic Exam  Mental status: Walter [name] is alert and oriented  x 3 at Walter time of Walter examination. Walter [name] has apparent normal recent and remote memory, with an apparently normal attention span and concentration ability. MOCA score done today was 27/30.  Cranial nerves: Facial symmetry is present. There is good sensation of Walter face to pinprick and soft touch bilaterally. Walter strength of Walter facial muscles and Walter muscles to head turning and shoulder shrug are normal bilaterally. Speech is well enunciated, no aphasia or dysarthria is noted. Extraocular movements are full. Visual fields are full. Walter tongue is midline, and Walter [name] has symmetric elevation of Walter soft palate. No obvious hearing deficits are noted.  Motor: Walter motor testing reveals 5 over 5 strength of all 4 extremities. Good symmetric motor tone is noted throughout.  Sensory: Sensory testing is intact to pinprick, soft touch, vibration sensation, and position sense on all 4 extremities. No evidence of extinction is noted.  Coordination: Cerebellar testing reveals good finger-nose-finger and heel-to-shin bilaterally.  Gait and station: Gait is wide-based, Walter [name] has a limping gait on Walter left leg. Romberg is negative.  Reflexes: Deep tendon reflexes are symmetric and normal bilaterally. Toes are downgoing bilaterally.   Assessment/Plan:  1. Delirium state, resolved  2. Essential tremor  Walter [name] had onset of Walter delirium that occurred within hours after a steroid injection. Walter [name] did not check a blood sugar around Walter time of onset of confusion. It is possible Walter [name] may have sustained a steroid psychosis. Walter [name] will be having  hip surgery in Walter next several weeks, he may be at somewhat higher risk of developing a delirium; minimizing duration of general anesthesia and use of pain medications may be of some benefit. There is no clear history of a pre-existing dementia or a memory disorder prior to Walter onset of delirium. At this point, Walter [name] wishes to be treated for Walter essential tremor, he will contact me. No other neurologic work-up is indicated for Walter delirium. Avoidance of steroids in Walter future may be important.  Jill Alexanders MD 05/06/2019 8:41 AM  [facility] [address] [place], [place] [address]  Phone [phone] Fax [phone]

## 2019-05-11 DIAGNOSIS — Z79891 Long term (current) use of opiate analgesic: Secondary | ICD-10-CM | POA: Diagnosis not present

## 2019-05-11 DIAGNOSIS — G894 Chronic pain syndrome: Secondary | ICD-10-CM | POA: Diagnosis not present

## 2019-05-11 DIAGNOSIS — M25519 Pain in unspecified shoulder: Secondary | ICD-10-CM | POA: Diagnosis not present

## 2019-05-11 DIAGNOSIS — Z79899 Other long term (current) drug therapy: Secondary | ICD-10-CM | POA: Diagnosis not present

## 2019-05-11 DIAGNOSIS — M1710 Unilateral primary osteoarthritis, unspecified knee: Secondary | ICD-10-CM | POA: Diagnosis not present

## 2019-05-11 DIAGNOSIS — M47817 Spondylosis without myelopathy or radiculopathy, lumbosacral region: Secondary | ICD-10-CM | POA: Diagnosis not present

## 2019-05-12 DIAGNOSIS — M47817 Spondylosis without myelopathy or radiculopathy, lumbosacral region: Secondary | ICD-10-CM | POA: Diagnosis not present

## 2019-05-18 ENCOUNTER — Encounter (HOSPITAL_COMMUNITY)
Admission: RE | Admit: 2019-05-18 | Discharge: 2019-05-18 | Disposition: A | Discharge status: Home / Self Care | Payer: Medicare Other | Source: Ambulatory Visit | Attending: Orthopedic Surgery | Admitting: Orthopedic Surgery

## 2019-05-18 ENCOUNTER — Encounter (HOSPITAL_COMMUNITY): Payer: Self-pay

## 2019-05-18 ENCOUNTER — Ambulatory Visit (INDEPENDENT_AMBULATORY_CARE_PROVIDER_SITE_OTHER): Payer: Medicare Other | Admitting: *Deleted

## 2019-05-18 ENCOUNTER — Other Ambulatory Visit: Payer: Self-pay

## 2019-05-18 DIAGNOSIS — I428 Other cardiomyopathies: Secondary | ICD-10-CM | POA: Diagnosis not present

## 2019-05-18 DIAGNOSIS — Z79899 Other long term (current) drug therapy: Secondary | ICD-10-CM | POA: Diagnosis not present

## 2019-05-18 DIAGNOSIS — K219 Gastro-esophageal reflux disease without esophagitis: Secondary | ICD-10-CM | POA: Diagnosis not present

## 2019-05-18 DIAGNOSIS — Z7984 Long term (current) use of oral hypoglycemic drugs: Secondary | ICD-10-CM | POA: Insufficient documentation

## 2019-05-18 DIAGNOSIS — Z87891 Personal history of nicotine dependence: Secondary | ICD-10-CM | POA: Insufficient documentation

## 2019-05-18 DIAGNOSIS — I2510 Atherosclerotic heart disease of native coronary artery without angina pectoris: Secondary | ICD-10-CM | POA: Insufficient documentation

## 2019-05-18 DIAGNOSIS — E119 Type 2 diabetes mellitus without complications: Secondary | ICD-10-CM | POA: Diagnosis not present

## 2019-05-18 DIAGNOSIS — E785 Hyperlipidemia, unspecified: Secondary | ICD-10-CM | POA: Diagnosis not present

## 2019-05-18 DIAGNOSIS — I10 Essential (primary) hypertension: Secondary | ICD-10-CM | POA: Diagnosis not present

## 2019-05-18 DIAGNOSIS — I252 Old myocardial infarction: Secondary | ICD-10-CM | POA: Diagnosis not present

## 2019-05-18 DIAGNOSIS — I447 Left bundle-branch block, unspecified: Secondary | ICD-10-CM | POA: Insufficient documentation

## 2019-05-18 DIAGNOSIS — Z01818 Encounter for other preprocedural examination: Secondary | ICD-10-CM | POA: Diagnosis not present

## 2019-05-18 DIAGNOSIS — Z7901 Long term (current) use of anticoagulants: Secondary | ICD-10-CM | POA: Insufficient documentation

## 2019-05-18 DIAGNOSIS — M1612 Unilateral primary osteoarthritis, left hip: Secondary | ICD-10-CM | POA: Insufficient documentation

## 2019-05-18 DIAGNOSIS — I4891 Unspecified atrial fibrillation: Secondary | ICD-10-CM | POA: Insufficient documentation

## 2019-05-18 HISTORY — DX: Personal history of other diseases of the respiratory system: Z87.09

## 2019-05-18 LAB — CUP PACEART REMOTE DEVICE CHECK
Battery Remaining Longevity: 26 mo
Battery Remaining Percentage: 31 %
Battery Voltage: 2.87 V
Brady Statistic AP VP Percent: 4 %
Brady Statistic AP VS Percent: 1 %
Brady Statistic AS VP Percent: 94 %
Brady Statistic AS VS Percent: 1.2 %
Brady Statistic RA Percent Paced: 4.4 %
Date Time Interrogation Session: 20210301110214
HighPow Impedance: 71 Ohm
HighPow Impedance: 71 Ohm
Implantable Lead Implant Date: 20151119
Implantable Lead Implant Date: 20151119
Implantable Lead Implant Date: 20151119
Implantable Lead Location: 753858
Implantable Lead Location: 753859
Implantable Lead Location: 753860
Implantable Pulse Generator Implant Date: 20151119
Lead Channel Impedance Value: 380 Ohm
Lead Channel Impedance Value: 450 Ohm
Lead Channel Impedance Value: 560 Ohm
Lead Channel Pacing Threshold Amplitude: 0.5 V
Lead Channel Pacing Threshold Amplitude: 0.75 V
Lead Channel Pacing Threshold Amplitude: 1 V
Lead Channel Pacing Threshold Pulse Width: 0.5 ms
Lead Channel Pacing Threshold Pulse Width: 0.5 ms
Lead Channel Pacing Threshold Pulse Width: 0.5 ms
Lead Channel Sensing Intrinsic Amplitude: 12 mV
Lead Channel Sensing Intrinsic Amplitude: 2.8 mV
Lead Channel Setting Pacing Amplitude: 1.5 V
Lead Channel Setting Pacing Amplitude: 2 V
Lead Channel Setting Pacing Amplitude: 2.5 V
Lead Channel Setting Pacing Pulse Width: 0.5 ms
Lead Channel Setting Pacing Pulse Width: 0.5 ms
Lead Channel Setting Sensing Sensitivity: 0.5 mV
Pulse Gen Serial Number: 7211486

## 2019-05-18 LAB — COMPREHENSIVE METABOLIC PANEL
ALT: 23 U/L (ref 0–44)
AST: 21 U/L (ref 15–41)
Albumin: 4.2 g/dL (ref 3.5–5.0)
Alkaline Phosphatase: 55 U/L (ref 38–126)
Anion gap: 9 (ref 5–15)
BUN: 17 mg/dL (ref 8–23)
CO2: 24 mmol/L (ref 22–32)
Calcium: 8.7 mg/dL — ABNORMAL LOW (ref 8.9–10.3)
Chloride: 102 mmol/L (ref 98–111)
Creatinine, Ser: 1.22 mg/dL (ref 0.61–1.24)
GFR calc Af Amer: >60 mL/min (ref >60)
GFR calc non Af Amer: 58 mL/min — ABNORMAL LOW (ref >60)
Glucose, Bld: 138 mg/dL — ABNORMAL HIGH (ref 70–99)
Potassium: 3.9 mmol/L (ref 3.5–5.1)
Sodium: 135 mmol/L (ref 135–145)
Total Bilirubin: 1 mg/dL (ref 0.3–1.2)
Total Protein: 6.7 g/dL (ref 6.5–8.1)

## 2019-05-18 LAB — CBC
HCT: 60.6 % — ABNORMAL HIGH (ref 39.0–52.0)
Hemoglobin: 19.6 g/dL — ABNORMAL HIGH (ref 13.0–17.0)
MCH: 31.8 pg (ref 26.0–34.0)
MCHC: 32.3 g/dL (ref 30.0–36.0)
MCV: 98.2 fL (ref 80.0–100.0)
Platelets: 204 10*3/uL (ref 150–400)
RBC: 6.17 MIL/uL — ABNORMAL HIGH (ref 4.22–5.81)
RDW: 14.6 % (ref 11.5–15.5)
WBC: 6.6 10*3/uL (ref 4.0–10.5)
nRBC: 0 % (ref 0.0–0.2)

## 2019-05-18 LAB — HEMOGLOBIN A1C
Hgb A1c MFr Bld: 7.3 % — ABNORMAL HIGH (ref 4.8–5.6)
Mean Plasma Glucose: 162.81 mg/dL

## 2019-05-18 LAB — SURGICAL PCR SCREEN
MRSA, PCR: NEGATIVE
Staphylococcus aureus: NEGATIVE

## 2019-05-18 LAB — GLUCOSE, CAPILLARY: Glucose-Capillary: 139 mg/dL — ABNORMAL HIGH (ref 70–99)

## 2019-05-18 NOTE — Progress Notes (Addendum)
PCP - Dr. Cherrie Gauze Marrow [facility] Cardiologist - Dr. Loralie Champagne  Chest x-ray - in [facility] in 11/02020 EKG - 12/04/2018 epic Stress Test - n/a ECHO - 02/21/2018 epic Cardiac Cath - 01/2014 epic (insertion of ICD), 10/2013 epic  Sleep Study - about 15 years ago CPAP - n./a  Fasting Blood Sugar - 100 Checks Blood Sugar __1___ times a day  Blood Thinner Instructions:Eliquis. Instructed per Dr. Aundra Dubin to stop 2 days prior to surgery but patient states he talked to Dr. Aundra Dubin and will stop 3 days prior to surgery as his last surgery, he had a lot of bleeding from incision and lost a lot of blood and fluids. Aspirin Instructions:n/a Last Dose: 05/22/2017  Anesthesia review:   Chart to be reviewed by Konrad Felix, PA.  Patient has a history of ICD-St. Jude, MI, CAD, A-Fib., LBBB, Diabetes Type 2, and Factor V Leiden and exposure to asbestosis in the [employer] (dx in 1990).  Patient denies shortness of breath, fever, cough and chest pain at PAT appointment   Patient verbalized understanding of instructions that were given to them at the PAT appointment. Patient was also instructed that they will need to review over the PAT instructions again at home before surgery.

## 2019-05-18 NOTE — Progress Notes (Signed)
ICD Remote  

## 2019-05-18 NOTE — Patient Instructions (Signed)
DUE TO COVID-19 ONLY ONE VISITOR IS ALLOWED TO COME WITH YOU AND STAY IN THE WAITING ROOM ONLY DURING PRE OP AND PROCEDURE DAY OF SURGERY. THE 1 VISITOR MAY VISIT WITH YOU AFTER SURGERY IN YOUR PRIVATE ROOM DURING VISITING HOURS ONLY!  YOU NEED TO HAVE A COVID 19 TEST ON_Friday 03/05/2021______ @__11 :20 am_____, THIS TEST MUST BE DONE BEFORE SURGERY, COME  [place]  , [id number].  ([place]) ONCE YOUR COVID TEST IS COMPLETED, PLEASE BEGIN THE QUARANTINE INSTRUCTIONS AS OUTLINED IN YOUR HANDOUT.                Walter [name]    Your procedure is scheduled on: Tuesday 05/26/2019   Report to [facility] Main  Entrance    Report to Short Stay at  0530   AM     Call this number if you have problems the morning of surgery [phone]    How to Manage Your Diabetes  Before and After Surgery  Why is it important to control my blood sugar before and after surgery? . Improving blood sugar levels before and after surgery helps healing and can limit problems. . A way of improving blood sugar control is eating a healthy diet by: o  Eating less sugar and carbohydrates o  Increasing activity/exercise o  Talking with your doctor about reaching your blood sugar goals . High blood sugars (greater than 180 mg/dL) can raise your risk of infections and slow your recovery, so you will need to focus on controlling your diabetes during the weeks before surgery. . Make sure that the doctor who takes care of your diabetes knows about your planned surgery including the date and location.  How do I manage my blood sugar before surgery? . Check your blood sugar at least 4 times a day, starting 2 days before surgery, to make sure that the level is not too high or low. o Check your blood sugar the morning of your surgery when you wake up and every 2 hours until you get to the Short Stay unit. . If your blood sugar is less than 70 mg/dL, you will need to treat for low  blood sugar: o Do not take insulin. o Treat a low blood sugar (less than 70 mg/dL) with  cup of clear juice (cranberry or apple), 4 glucose tablets, OR glucose gel. o Recheck blood sugar in 15 minutes after treatment (to make sure it is greater than 70 mg/dL). If your blood sugar is not greater than 70 mg/dL on recheck, call [phone] for further instructions. . Report your blood sugar to the short stay nurse when you get to Short Stay.  . If you are admitted to the hospital after surgery: o Your blood sugar will be checked by the staff and you will probably be given insulin after surgery (instead of oral diabetes medicines) to make sure you have good blood sugar levels. o The goal for blood sugar control after surgery is 80-180 mg/dL.   WHAT DO I DO ABOUT MY DIABETES MEDICATION?       The day before surgery, Do NOT TAKE JARDIANCE!       The day before surgery, Take Metformin (Glucophage ) as prescribed!  . Do not take oral diabetes medicines (pills) the morning of surgery.       Remember: Do not eat food  :After Midnight.    NO SOLID FOOD AFTER MIDNIGHT THE NIGHT PRIOR TO SURGERY  And  NOTHING BY MOUTH EXCEPT CLEAR LIQUIDS UNTIL  0430 am .     PLEASE FINISH Gatorade G 2  DRINK PER SURGEON ORDER  WHICH NEEDS TO BE COMPLETED AT   0430 am.   CLEAR LIQUID DIET   Foods Allowed                                                                     Foods Excluded  Coffee and tea, regular and decaf                             liquids that you cannot  Plain Jell-O any favor except red or purple                                           see through such as: Fruit ices (not with fruit pulp)                                     milk, soups, orange juice  Iced Popsicles                                    All solid food Carbonated beverages, regular and diet                                    Cranberry, grape and apple juices Sports drinks like Gatorade Lightly seasoned clear broth or  consume(fat free) Sugar, honey syrup  Sample Menu Breakfast                                Lunch                                     Supper Cranberry juice                    Beef broth                            Chicken broth Jell-O                                     Grape juice                           Apple juice Coffee or tea                        Jell-O  Popsicle                                                Coffee or tea                        Coffee or tea  _____________________________________________________________________     BRUSH YOUR TEETH MORNING OF SURGERY AND RINSE YOUR MOUTH OUT, NO CHEWING GUM CANDY OR MINTS.     Take these medicines the morning of surgery with A SIP OF WATER: Gabapentin Neurontin), Carvedilol (Coreg), use inhalers if needed and bring inhaler with you to the hospital   DO NOT Aucilla!                               You may not have any metal on your body including hair pins and              piercings  Do not wear jewelry, make-up, lotions, powders or perfumes, deodorant                         Men may shave face and neck.   Do not bring valuables to the hospital. [place].  Contacts, dentures or bridgework may not be worn into surgery.  Leave suitcase in the car. After surgery it may be brought to your room.                   Please read over the following fact sheets you were given: _____________________________________________________________________             [facility] - Preparing for Surgery Before surgery, you can play an important role.  Because skin is not sterile, your skin needs to be as free of germs as possible.  You can reduce the number of germs on your skin by washing with CHG (chlorahexidine gluconate) soap before surgery.  CHG is an antiseptic cleaner which kills germs and bonds with the skin  to continue killing germs even after washing. Please DO NOT use if you have an allergy to CHG or antibacterial soaps.  If your skin becomes reddened/irritated stop using the CHG and inform your nurse when you arrive at Short Stay. Do not shave (including legs and underarms) for at least 48 hours prior to the first CHG shower.  You may shave your face/neck. Please follow these instructions carefully:  1.  Shower with CHG Soap the night before surgery and the  morning of Surgery.  2.  If you choose to wash your hair, wash your hair first as usual with your  normal  shampoo.  3.  After you shampoo, rinse your hair and body thoroughly to remove the  shampoo.                           4.  Use CHG as you would any other liquid soap.  You can apply chg directly  to the skin and wash  Gently with a scrungie or clean washcloth.  5.  Apply the CHG Soap to your body ONLY FROM THE NECK DOWN.   Do not use on face/ open                           Wound or open sores. Avoid contact with eyes, ears mouth and genitals (private parts).                       Wash face,  Genitals (private parts) with your normal soap.             6.  Wash thoroughly, paying special attention to the area where your surgery  will be performed.  7.  Thoroughly rinse your body with warm water from the neck down.  8.  DO NOT shower/wash with your normal soap after using and rinsing off  the CHG Soap.                9.  Pat yourself dry with a clean towel.            10.  Wear clean pajamas.            11.  Place clean sheets on your bed the night of your first shower and do not  sleep with pets. Day of Surgery : Do not apply any lotions/deodorants the morning of surgery.  Please wear clean clothes to the hospital/surgery center.  FAILURE TO FOLLOW THESE INSTRUCTIONS MAY RESULT IN THE CANCELLATION OF YOUR SURGERY PATIENT SIGNATURE_________________________________  NURSE  SIGNATURE__________________________________  ________________________________________________________________________   Adam Phenix  An incentive spirometer is a tool that can help keep your lungs clear and active. This tool measures how well you are filling your lungs with each breath. Taking long deep breaths may help reverse or decrease the chance of developing breathing (pulmonary) problems (especially infection) following:  A long period of time when you are unable to move or be active. BEFORE THE PROCEDURE   If the spirometer includes an indicator to show your best effort, your nurse or respiratory therapist will set it to a desired goal.  If possible, sit up straight or lean slightly forward. Try not to slouch.  Hold the incentive spirometer in an upright position. INSTRUCTIONS FOR USE  1. Sit on the edge of your bed if possible, or sit up as far as you can in bed or on a chair. 2. Hold the incentive spirometer in an upright position. 3. Breathe out normally. 4. Place the mouthpiece in your mouth and seal your lips tightly around it. 5. Breathe in slowly and as deeply as possible, raising the piston or the ball toward the top of the column. 6. Hold your breath for 3-5 seconds or for as long as possible. Allow the piston or ball to fall to the bottom of the column. 7. Remove the mouthpiece from your mouth and breathe out normally. 8. Rest for a few seconds and repeat Steps 1 through 7 at least 10 times every 1-2 hours when you are awake. Take your time and take a few normal breaths between deep breaths. 9. The spirometer may include an indicator to show your best effort. Use the indicator as a goal to work toward during each repetition. 10. After each set of 10 deep breaths, practice coughing to be sure your lungs are clear. If you have an incision (the cut made at the time of surgery),  support your incision when coughing by placing a pillow or rolled up towels firmly  against it. Once you are able to get out of bed, walk around indoors and cough well. You may stop using the incentive spirometer when instructed by your caregiver.  RISKS AND COMPLICATIONS  Take your time so you do not get dizzy or light-headed.  If you are in pain, you may need to take or ask for pain medication before doing incentive spirometry. It is harder to take a deep breath if you are having pain. AFTER USE  Rest and breathe slowly and easily.  It can be helpful to keep track of a log of your progress. Your caregiver can provide you with a simple table to help with this. If you are using the spirometer at home, follow these instructions: [place] IF:   You are having difficultly using the spirometer.  You have trouble using the spirometer as often as instructed.  Your pain medication is not giving enough relief while using the spirometer.  You develop fever of 100.5 F (38.1 C) or higher. SEEK IMMEDIATE MEDICAL CARE IF:   You cough up bloody sputum that had not been present before.  You develop fever of 102 F (38.9 C) or greater.  You develop worsening pain at or near the incision site. MAKE SURE YOU:   Understand these instructions.  Will watch your condition.  Will get help right away if you are not doing well or get worse. Document Released: 07/16/2006 Document Revised: 05/28/2011 Document Reviewed: 09/16/2006 [facility] Patient Information 2014 [place], [place].   ________________________________________________________________________

## 2019-05-19 ENCOUNTER — Ambulatory Visit (INDEPENDENT_AMBULATORY_CARE_PROVIDER_SITE_OTHER): Payer: Medicare Other

## 2019-05-19 DIAGNOSIS — I5022 Chronic systolic (congestive) heart failure: Secondary | ICD-10-CM | POA: Diagnosis not present

## 2019-05-19 DIAGNOSIS — Z95810 Presence of automatic (implantable) cardiac defibrillator: Secondary | ICD-10-CM

## 2019-05-19 NOTE — Progress Notes (Addendum)
Anesthesia Chart Review   Case: [id number] Date/Time: 05/26/19 0715   Procedure: TOTAL HIP ARTHROPLASTY (Left Hip)   Anesthesia type: Choice   Pre-op diagnosis: djd left hip   Location: [facility] ROOM 07 / [facility]   Surgeons: Marchia Bond, MD      DISCUSSION:74 y.o. former smoker (quit 02/10/73) with h/o PONV, HTN, GERD, nonobstructive CAD, chronic LBBB, Factor V Leiden, DM II, Atrial fibrillation (on Eliquis), cardiomyopathy, AICD, left hip djd scheduled for above procedure 05/26/2019 with Dr. Marchia Bond.   Pt last seen by cardiology 12/04/2018.  Per OV note, "He is going to need L THR soon.  I think that from a cardiac standpoint, he is of reasonable risk to undergo left THR."  Advised to hold Eliquis 3 days prior to procedure.   Anticipate pt can proceed with planned procedure barring acute status change.   VS: BP (!) 153/92   Pulse 77   Temp 36.8 C (Oral)   Resp 16   Ht 6' (1.829 m)   Wt 112.5 kg   SpO2 94%   BMI 33.63 kg/m   PROVIDERS: London Pepper, MD is PCP   Loralie Champagne, MD is Cardiologist  LABS: Labs reviewed: Acceptable for surgery. (all labs ordered are listed, but only abnormal results are displayed)  Labs Reviewed  CBC - Abnormal; Notable for the following components:      Result Value   RBC 6.17 (*)    Hemoglobin 19.6 (*)    HCT 60.6 (*)    All other components within normal limits  COMPREHENSIVE METABOLIC PANEL - Abnormal; Notable for the following components:   Glucose, Bld 138 (*)    Calcium 8.7 (*)    GFR calc non Af Amer 58 (*)    All other components within normal limits  GLUCOSE, CAPILLARY - Abnormal; Notable for the following components:   Glucose-Capillary 139 (*)    All other components within normal limits  HEMOGLOBIN A1C - Abnormal; Notable for the following components:   Hgb A1c MFr Bld 7.3 (*)    All other components within normal limits  SURGICAL PCR SCREEN     IMAGES:   EKG: 12/04/2018 Rate 71 bpm Atrial-sensed ventricular-paced  rhythm Biventricular pacemaker detected Abnormal ECG No significant change since last tracing  CV: Echo 02/21/2018 Study Conclusions   - Left ventricle: The cavity size was normal. Wall thickness was  increased in a pattern of moderate LVH. Systolic function was  normal. The estimated ejection fraction was in the range of 50%  to 55%. Wall motion was normal; there were no regional wall  motion abnormalities. Doppler parameters are consistent with  abnormal left ventricular relaxation (grade 1 diastolic  dysfunction).  - Mitral valve: Moderately calcified annulus. There was trivial  regurgitation.  - Left atrium: The atrium was mildly dilated.  - Right ventricle: The cavity size was mildly dilated. Wall  thickness was normal.   Impressions:   - Compared to the prior study, there has been no significant  interval change.  Past Medical History:  Diagnosis Date  . AICD (automatic cardioverter/defibrillator) present   . Anxiety   . Asbestosis ([place])    mild  . Atrial fibrillation ([place])   . Chronic low back pain    /sciatica- Dr Letta Median [facility])  . Complication of anesthesia 1996   previous surgery had a run of v-tach (for gallbladder-1990's)-cannot lie completely flat and panis when oxygen mask placed over face  . Coronary artery disease, non-occlusive June 2011  Cardiac cath in [facility] following abnormal Myoview  . Delirium 05/06/2019  . DJD (degenerative joint disease)    /osteoarthritis  . Dyslipidemia, goal LDL below 100    On lovastatin (myalgias with Zocor and Lipitor)  . Dyspnea    worse in hot weather  . Dysrhythmia    LBBB, in 2015-atrial fibrillation  . Essential hypertension   . Factor V Leiden [facility])    sees Dr. Cherrie Gauze Marrow- no problems with this  . Frozen shoulder    right  . GERD (gastroesophageal reflux disease)   . H/O asbestosis    when in [employer]-1964-1970,1971-1986 in [employer]-diagnosed in 1990  .  History of kidney stones   . Hypogonadism male   . Irritable bowel   . Kidney stone   . Left bundle branch block (LBBB) on electrocardiogram    Diagnosed close to 20 years ago  . Myocardial infarction ([place])   . Non-ischemic cardiomyopathy [facility]) June 2011   EF 45-50%; suspect related to LBBB; repeat Echo March 2015: EF 50- 55%  . Obesity   . PONV (postoperative nausea and vomiting)   . Restless leg syndrome   . Rotator cuff arthropathy of right shoulder 05/14/2017  . Testicular cancer ([place]) 1972   left  . Tremor, essential 12/09/2015  . Type 2 diabetes mellitus without complication ([place]) [id number]   Type 2.     Past Surgical History:  Procedure Laterality Date  . APPENDECTOMY N/A   . BI-VENTRICULAR IMPLANTABLE CARDIOVERTER DEFIBRILLATOR N/A 02/04/2014   SJM Quadra Assura BiV ICD implanted for primary prevention by [name]  . CARDIAC CATHETERIZATION  June 2011   Nonobstructive CAD  . CARDIAC DEFIBRILLATOR PLACEMENT  02/04/14   [facility] model [place] 3365-40Q (serial Number [id number]) biventricular ICD.  Marland Kitchen CARDIOVERSION N/A 10/13/2013   Procedure: CARDIOVERSION;  Surgeon: Larey Dresser, MD;  Location: [place];  Service: Cardiovascular;  Laterality: N/A;  . CARDIOVERSION  10-16-2013   DCCV by [name] following intracardiac echo to rule out LAA thrombus  . CARDIOVERSION N/A 10/16/2013   Procedure: CARDIOVERSION;  Surgeon: Coralyn Mark, MD;  Location: [facility] CATH LAB;  Service: Cardiovascular;  Laterality: N/A;  . CHOLECYSTECTOMY OPEN    . ELECTROPHYSIOLOGY STUDY N/A 10/16/2013   Procedure: ELECTROPHYSIOLOGY STUDY;  Surgeon: Coralyn Mark, MD;  Location: [facility] CATH LAB;  Service: Cardiovascular;  Laterality: N/A;  . ESOPHAGOGASTRODUODENOSCOPY N/A 10/15/2013   Procedure: ESOPHAGOGASTRODUODENOSCOPY (EGD);  Surgeon: Gatha Mayer, MD;  Location: [facility] ENDOSCOPY;  Service: Endoscopy;  Laterality: N/A;  bedside  . HERNIA REPAIR    . KNEE ARTHROSCOPY Left   . LEFT AND RIGHT HEART  CATHETERIZATION WITH CORONARY ANGIOGRAM N/A 10/12/2013   Procedure: LEFT AND RIGHT HEART CATHETERIZATION WITH CORONARY ANGIOGRAM;  Surgeon: Troy Sine, MD;  Location: [facility] CATH LAB;  Service: Cardiovascular;  Laterality: N/A;  . lymph nodes    . RADIOFREQUENCY ABLATION NERVES     Back every 6 months  . REVERSE SHOULDER ARTHROPLASTY Right 05/14/2017   Procedure: TOTAL REVERSE SHOULDER ARTHROPLASTY;  Surgeon: Marchia Bond, MD;  Location: [place];  Service: Orthopedics;  Laterality: Right;  . ROTATOR CUFF REPAIR Right   . SURGERY SCROTAL / TESTICULAR Left   . TONSILLECTOMY Bilateral   . TOTAL HIP ARTHROPLASTY Right 02/22/2015   Procedure: RIGHT TOTAL HIP ARTHROPLASTY ANTERIOR APPROACH;  Surgeon: Renette Butters, MD;  Location: [facility];  Service: Orthopedics;  Laterality: Right;  . TRANSTHORACIC ECHOCARDIOGRAM  May 2011  EF 45% with diffuse hypokinesis; septal bounce from LBBB  . TRANSTHORACIC ECHOCARDIOGRAM  March 2015   EF 50-55%. Mild concentric LVH. Septal dyssynergy from LBBB     MEDICATIONS: . albuterol (PROVENTIL HFA;VENTOLIN HFA) 108 (90 Base) MCG/ACT inhaler  . amiodarone (PACERONE) 200 MG tablet  . apixaban (ELIQUIS) 5 MG TABS tablet  . carvedilol (COREG) 12.5 MG tablet  . cholecalciferol (VITAMIN D3) 25 MCG (1000 UNIT) tablet  . empagliflozin (JARDIANCE) 10 MG TABS tablet  . esomeprazole (NEXIUM) 40 MG capsule  . furosemide (LASIX) 20 MG tablet  . gabapentin (NEURONTIN) 300 MG capsule  . lisinopril (ZESTRIL) 10 MG tablet  . metFORMIN (GLUCOPHAGE) 500 MG tablet  . omega-3 acid ethyl esters (LOVAZA) 1 G capsule  . oxyCODONE-acetaminophen (PERCOCET) 10-325 MG tablet  . rOPINIRole (REQUIP) 0.5 MG tablet  . rOPINIRole (REQUIP) 1 MG tablet  . sildenafil (VIAGRA) 100 MG tablet  . spironolactone (ALDACTONE) 25 MG tablet  . testosterone cypionate (DEPO-TESTOSTERONE) 200 MG/ML injection  . tiZANidine (ZANAFLEX) 4 MG capsule   No current facility-administered medications for this  encounter.    Maia Plan [facility] Pre-Surgical Testing [phone] 05/19/19  11:06 AM

## 2019-05-19 NOTE — Progress Notes (Signed)
EPIC Encounter for ICM Monitoring  Patient Name: Walter [name] is a 74 y.o. male Date: 05/19/2019 Primary Care Physican: London Pepper, MD Primary Cardiologist:[place] Electrophysiologist: Allred Bi-V Pacing:98% 05/19/2019 Weight:246-247 lbs   Spoke with patient.  He took extra Furosemide this AM because he felt like he had some fluid because he has not been strict on low salt diet.  He is scheduled for hip surgery 05/26/19.    CorVuethoracic impedancesuggesting possible ongoing fluid accumulation since 05/16/2019.  Furosemide20 mgTake 1 tablet (20 mg total) bymouth every other day.   Labs: 02/26/2019 Creatinine 1.18, BUN 15, Potassium 4.2, Sodium 140, GFR >60 12/04/2018 Creatinine 1.34, BUN 14, Potassium 4.1, Sodium 136, GFR 52->60 07/21/2018 Creatinine1.43, BUN22, Potassium4.3, Sodium137, [id number] A complete set of results can be found in Results Review.  Recommendations:  Patient self adjusting Furosemide by taking extra tablet x 2 days.    Follow-up plan: ICM clinic phone appointment on 05/25/2019 to recheck fluid levels.  91 day device clinic remote transmission 08/19/2019.  Office appt 06/04/2019 with Chanetta Marshall, NP.    Copy of ICM check sent to Dr. Rayann Heman and Dr Aundra Dubin.   3 month ICM trend: 05/18/2019    1 Year ICM trend:       Rosalene Billings, RN 05/19/2019 9:42 AM

## 2019-05-22 ENCOUNTER — Other Ambulatory Visit (HOSPITAL_COMMUNITY)
Admission: RE | Admit: 2019-05-22 | Discharge: 2019-05-22 | Disposition: A | Discharge status: Home / Self Care | Payer: Medicare Other | Source: Ambulatory Visit | Attending: Orthopedic Surgery | Admitting: Orthopedic Surgery

## 2019-05-22 DIAGNOSIS — Z20822 Contact with and (suspected) exposure to covid-19: Secondary | ICD-10-CM | POA: Diagnosis not present

## 2019-05-22 LAB — SARS CORONAVIRUS 2 (TAT 6-24 HRS): SARS Coronavirus 2: NEGATIVE

## 2019-05-25 ENCOUNTER — Ambulatory Visit: Payer: Medicare Other

## 2019-05-25 DIAGNOSIS — I5022 Chronic systolic (congestive) heart failure: Secondary | ICD-10-CM

## 2019-05-25 DIAGNOSIS — Z95810 Presence of automatic (implantable) cardiac defibrillator: Secondary | ICD-10-CM

## 2019-05-25 NOTE — Anesthesia Preprocedure Evaluation (Addendum)
Anesthesia Evaluation  Patient identified by MRN, date of birth, ID band Patient awake    Reviewed: Allergy & Precautions, NPO status   History of Anesthesia Complications (+) PONV  Airway Mallampati: II  TM Distance: >3 FB     Dental   Pulmonary shortness of breath, former smoker,    breath sounds clear to auscultation       Cardiovascular hypertension, + CAD and + Past MI  + dysrhythmias + Cardiac Defibrillator  Rhythm:Regular Rate:Normal     Neuro/Psych Anxiety    GI/Hepatic Neg liver ROS, GERD  ,  Endo/Other  diabetes  Renal/GU Renal disease     Musculoskeletal  (+) Arthritis ,   Abdominal   Peds  Hematology   Anesthesia Other Findings   Reproductive/Obstetrics                           Anesthesia Physical Anesthesia Plan  ASA: III  Anesthesia Plan: General   Post-op Pain Management:    Induction: Intravenous  PONV Risk Score and Plan: Ondansetron, Dexamethasone and Midazolam  Airway Management Planned: Oral ETT  Additional Equipment:   Intra-op Plan:   Post-operative Plan: Extubation in OR  Informed Consent: I have reviewed the patients History and Physical, chart, labs and discussed the procedure including the risks, benefits and alternatives for the proposed anesthesia with the patient or authorized representative who has indicated his/her understanding and acceptance.     Dental advisory given  Plan Discussed with: Anesthesiologist and CRNA  Anesthesia Plan Comments:        Anesthesia Quick Evaluation

## 2019-05-26 ENCOUNTER — Ambulatory Visit (HOSPITAL_COMMUNITY): Payer: Medicare Other | Admitting: Anesthesiology

## 2019-05-26 ENCOUNTER — Ambulatory Visit (HOSPITAL_COMMUNITY): Payer: Medicare Other | Admitting: Physician Assistant

## 2019-05-26 ENCOUNTER — Inpatient Hospital Stay (HOSPITAL_COMMUNITY)
Admission: RE | Admit: 2019-05-26 | Discharge: 2019-05-28 | DRG: 470 | Disposition: A | Discharge status: Home Health | Payer: Medicare Other | Attending: Orthopedic Surgery | Admitting: Orthopedic Surgery

## 2019-05-26 ENCOUNTER — Inpatient Hospital Stay (HOSPITAL_COMMUNITY): Payer: Medicare Other

## 2019-05-26 ENCOUNTER — Encounter (HOSPITAL_COMMUNITY)
Admission: RE | Disposition: A | Discharge status: Home Health | Payer: Self-pay | Source: Home / Self Care | Attending: Orthopedic Surgery

## 2019-05-26 ENCOUNTER — Other Ambulatory Visit: Payer: Self-pay

## 2019-05-26 ENCOUNTER — Encounter (HOSPITAL_COMMUNITY): Payer: Self-pay | Admitting: Orthopedic Surgery

## 2019-05-26 DIAGNOSIS — Z87891 Personal history of nicotine dependence: Secondary | ICD-10-CM

## 2019-05-26 DIAGNOSIS — Z20822 Contact with and (suspected) exposure to covid-19: Secondary | ICD-10-CM | POA: Diagnosis not present

## 2019-05-26 DIAGNOSIS — Z96611 Presence of right artificial shoulder joint: Secondary | ICD-10-CM | POA: Diagnosis present

## 2019-05-26 DIAGNOSIS — Z8547 Personal history of malignant neoplasm of testis: Secondary | ICD-10-CM

## 2019-05-26 DIAGNOSIS — I252 Old myocardial infarction: Secondary | ICD-10-CM | POA: Diagnosis not present

## 2019-05-26 DIAGNOSIS — Z888 Allergy status to other drugs, medicaments and biological substances status: Secondary | ICD-10-CM

## 2019-05-26 DIAGNOSIS — E119 Type 2 diabetes mellitus without complications: Secondary | ICD-10-CM | POA: Diagnosis not present

## 2019-05-26 DIAGNOSIS — Z96649 Presence of unspecified artificial hip joint: Secondary | ICD-10-CM

## 2019-05-26 DIAGNOSIS — M1612 Unilateral primary osteoarthritis, left hip: Principal | ICD-10-CM | POA: Diagnosis present

## 2019-05-26 DIAGNOSIS — E669 Obesity, unspecified: Secondary | ICD-10-CM | POA: Diagnosis present

## 2019-05-26 DIAGNOSIS — I255 Ischemic cardiomyopathy: Secondary | ICD-10-CM | POA: Diagnosis not present

## 2019-05-26 DIAGNOSIS — Z95810 Presence of automatic (implantable) cardiac defibrillator: Secondary | ICD-10-CM

## 2019-05-26 DIAGNOSIS — Z471 Aftercare following joint replacement surgery: Secondary | ICD-10-CM | POA: Diagnosis not present

## 2019-05-26 DIAGNOSIS — Z96641 Presence of right artificial hip joint: Secondary | ICD-10-CM | POA: Diagnosis present

## 2019-05-26 DIAGNOSIS — K589 Irritable bowel syndrome without diarrhea: Secondary | ICD-10-CM | POA: Diagnosis not present

## 2019-05-26 DIAGNOSIS — E291 Testicular hypofunction: Secondary | ICD-10-CM | POA: Diagnosis not present

## 2019-05-26 DIAGNOSIS — D6851 Activated protein C resistance: Secondary | ICD-10-CM | POA: Diagnosis not present

## 2019-05-26 DIAGNOSIS — I2510 Atherosclerotic heart disease of native coronary artery without angina pectoris: Secondary | ICD-10-CM | POA: Diagnosis present

## 2019-05-26 DIAGNOSIS — I10 Essential (primary) hypertension: Secondary | ICD-10-CM | POA: Diagnosis not present

## 2019-05-26 DIAGNOSIS — K219 Gastro-esophageal reflux disease without esophagitis: Secondary | ICD-10-CM | POA: Diagnosis present

## 2019-05-26 DIAGNOSIS — E785 Hyperlipidemia, unspecified: Secondary | ICD-10-CM | POA: Diagnosis not present

## 2019-05-26 DIAGNOSIS — G8929 Other chronic pain: Secondary | ICD-10-CM | POA: Diagnosis present

## 2019-05-26 DIAGNOSIS — Z79899 Other long term (current) drug therapy: Secondary | ICD-10-CM

## 2019-05-26 DIAGNOSIS — Z7984 Long term (current) use of oral hypoglycemic drugs: Secondary | ICD-10-CM

## 2019-05-26 DIAGNOSIS — I428 Other cardiomyopathies: Secondary | ICD-10-CM | POA: Diagnosis not present

## 2019-05-26 DIAGNOSIS — G2581 Restless legs syndrome: Secondary | ICD-10-CM | POA: Diagnosis present

## 2019-05-26 DIAGNOSIS — S76012A Strain of muscle, fascia and tendon of left hip, initial encounter: Secondary | ICD-10-CM | POA: Diagnosis present

## 2019-05-26 DIAGNOSIS — Z96642 Presence of left artificial hip joint: Secondary | ICD-10-CM | POA: Diagnosis not present

## 2019-05-26 DIAGNOSIS — Z9049 Acquired absence of other specified parts of digestive tract: Secondary | ICD-10-CM | POA: Diagnosis not present

## 2019-05-26 DIAGNOSIS — J61 Pneumoconiosis due to asbestos and other mineral fibers: Secondary | ICD-10-CM | POA: Diagnosis present

## 2019-05-26 DIAGNOSIS — Z6833 Body mass index (BMI) 33.0-33.9, adult: Secondary | ICD-10-CM

## 2019-05-26 DIAGNOSIS — Z8249 Family history of ischemic heart disease and other diseases of the circulatory system: Secondary | ICD-10-CM

## 2019-05-26 DIAGNOSIS — F419 Anxiety disorder, unspecified: Secondary | ICD-10-CM | POA: Diagnosis not present

## 2019-05-26 DIAGNOSIS — Z7901 Long term (current) use of anticoagulants: Secondary | ICD-10-CM

## 2019-05-26 HISTORY — PX: TOTAL HIP ARTHROPLASTY: SHX124

## 2019-05-26 LAB — POCT I-STAT EG7
Acid-Base Excess: 1 mmol/L (ref 0.0–2.0)
Bicarbonate: 27.2 mmol/L (ref 20.0–28.0)
Calcium, Ion: 1.13 mmol/L — ABNORMAL LOW (ref 1.15–1.40)
HCT: 52 % (ref 39.0–52.0)
Hemoglobin: 17.7 g/dL — ABNORMAL HIGH (ref 13.0–17.0)
O2 Saturation: 98 %
Potassium: 3.7 mmol/L (ref 3.5–5.1)
Sodium: 136 mmol/L (ref 135–145)
TCO2: 29 mmol/L (ref 22–32)
pCO2, Ven: 46.4 mmHg (ref 44.0–60.0)
pH, Ven: 7.377 (ref 7.250–7.430)
pO2, Ven: 109 mmHg — ABNORMAL HIGH (ref 32.0–45.0)

## 2019-05-26 LAB — ABO/RH: ABO/RH(D): O POS

## 2019-05-26 LAB — GLUCOSE, CAPILLARY
Glucose-Capillary: 153 mg/dL — ABNORMAL HIGH (ref 70–99)
Glucose-Capillary: 157 mg/dL — ABNORMAL HIGH (ref 70–99)
Glucose-Capillary: 154 mg/dL — ABNORMAL HIGH (ref 70–99)
Glucose-Capillary: 161 mg/dL — ABNORMAL HIGH (ref 70–99)

## 2019-05-26 LAB — PREPARE RBC (CROSSMATCH)

## 2019-05-26 SURGERY — ARTHROPLASTY, HIP, TOTAL,POSTERIOR APPROACH
Anesthesia: General | Site: Hip | Laterality: Left

## 2019-05-26 MED ORDER — METHOCARBAMOL 500 MG IVPB - SIMPLE MED
INTRAVENOUS | Status: AC
  Filled 2019-05-26: qty 50

## 2019-05-26 MED ORDER — LIDOCAINE 2% (20 MG/ML) 5 ML SYRINGE
INTRAMUSCULAR | Status: DC | PRN
  Administered 2019-05-26: 80 mg via INTRAVENOUS

## 2019-05-26 MED ORDER — DEXAMETHASONE SODIUM PHOSPHATE 10 MG/ML IJ SOLN
INTRAMUSCULAR | Status: AC
  Filled 2019-05-26: qty 1

## 2019-05-26 MED ORDER — ROCURONIUM BROMIDE 10 MG/ML (PF) SYRINGE
PREFILLED_SYRINGE | INTRAVENOUS | Status: DC | PRN
  Administered 2019-05-26: 30 mg via INTRAVENOUS
  Administered 2019-05-26: 40 mg via INTRAVENOUS
  Administered 2019-05-26: 60 mg via INTRAVENOUS

## 2019-05-26 MED ORDER — BISACODYL 10 MG RE SUPP: 10.0000 mg | Freq: Every day | RECTAL | Status: DC | PRN

## 2019-05-26 MED ORDER — CHLORHEXIDINE GLUCONATE CLOTH 2 % EX PADS: 6.0000 | MEDICATED_PAD | Freq: Every day | CUTANEOUS | Status: DC

## 2019-05-26 MED ORDER — METFORMIN HCL 500 MG PO TABS
1000.0000 mg | ORAL_TABLET | Freq: Two times a day (BID) | ORAL | Status: DC
  Administered 2019-05-26 – 2019-05-28 (×4): 1000 mg via ORAL
  Filled 2019-05-26 (×4): qty 2

## 2019-05-26 MED ORDER — OXYCODONE HCL 5 MG PO TABS
5.0000 mg | ORAL_TABLET | ORAL | Status: DC | PRN
  Administered 2019-05-26: 10 mg via ORAL
  Administered 2019-05-26 (×2): 5 mg via ORAL
  Administered 2019-05-27: 10 mg via ORAL
  Filled 2019-05-26: qty 2
  Filled 2019-05-26: qty 1
  Filled 2019-05-26 (×2): qty 2
  Filled 2019-05-26: qty 1

## 2019-05-26 MED ORDER — CHLORHEXIDINE GLUCONATE 4 % EX LIQD: 60.0000 mL | Freq: Once | CUTANEOUS | Status: DC

## 2019-05-26 MED ORDER — LACTATED RINGERS IV SOLN: INTRAVENOUS | Status: DC

## 2019-05-26 MED ORDER — MENTHOL 3 MG MT LOZG: 1.0000 | LOZENGE | OROMUCOSAL | Status: DC | PRN

## 2019-05-26 MED ORDER — METOCLOPRAMIDE HCL 5 MG/ML IJ SOLN: 5.0000 mg | Freq: Three times a day (TID) | INTRAMUSCULAR | Status: DC | PRN

## 2019-05-26 MED ORDER — BUPIVACAINE HCL (PF) 0.25 % IJ SOLN
INTRAMUSCULAR | Status: AC
  Filled 2019-05-26: qty 30

## 2019-05-26 MED ORDER — ALBUTEROL SULFATE (2.5 MG/3ML) 0.083% IN NEBU: 3.0000 mL | INHALATION_SOLUTION | Freq: Four times a day (QID) | RESPIRATORY_TRACT | Status: DC | PRN

## 2019-05-26 MED ORDER — ACETAMINOPHEN 500 MG PO TABS
1000.0000 mg | ORAL_TABLET | Freq: Once | ORAL | Status: AC
  Administered 2019-05-26: 1000 mg via ORAL
  Filled 2019-05-26: qty 2

## 2019-05-26 MED ORDER — BACLOFEN 10 MG PO TABS: 10.0000 mg | ORAL_TABLET | Freq: Three times a day (TID) | ORAL | 0 refills | Status: DC

## 2019-05-26 MED ORDER — CARVEDILOL 12.5 MG PO TABS
12.5000 mg | ORAL_TABLET | Freq: Two times a day (BID) | ORAL | Status: DC
  Administered 2019-05-26 – 2019-05-28 (×4): 12.5 mg via ORAL
  Filled 2019-05-26 (×4): qty 1

## 2019-05-26 MED ORDER — POLYETHYLENE GLYCOL 3350 17 G PO PACK: 17.0000 g | PACK | Freq: Every day | ORAL | Status: DC | PRN

## 2019-05-26 MED ORDER — DOCUSATE SODIUM 100 MG PO CAPS
100.0000 mg | ORAL_CAPSULE | Freq: Two times a day (BID) | ORAL | Status: DC
  Administered 2019-05-26 – 2019-05-28 (×4): 100 mg via ORAL
  Filled 2019-05-26 (×4): qty 1

## 2019-05-26 MED ORDER — OXYCODONE HCL 5 MG PO TABS
10.0000 mg | ORAL_TABLET | ORAL | Status: DC | PRN
  Administered 2019-05-27 – 2019-05-28 (×5): 15 mg via ORAL
  Administered 2019-05-28: 10 mg via ORAL
  Administered 2019-05-28: 15 mg via ORAL
  Filled 2019-05-26 (×6): qty 3

## 2019-05-26 MED ORDER — KETOROLAC TROMETHAMINE 30 MG/ML IJ SOLN
INTRAMUSCULAR | Status: DC | PRN
  Administered 2019-05-26: 30 mg

## 2019-05-26 MED ORDER — ROPINIROLE HCL 1 MG PO TABS
1.0000 mg | ORAL_TABLET | Freq: Every day | ORAL | Status: DC
  Administered 2019-05-26 – 2019-05-27 (×2): 1 mg via ORAL
  Filled 2019-05-26 (×2): qty 1

## 2019-05-26 MED ORDER — ACETAMINOPHEN 325 MG PO TABS
325.0000 mg | ORAL_TABLET | Freq: Four times a day (QID) | ORAL | Status: DC | PRN
  Administered 2019-05-27 – 2019-05-28 (×2): 650 mg via ORAL
  Filled 2019-05-26 (×2): qty 2

## 2019-05-26 MED ORDER — ONDANSETRON HCL 4 MG/2ML IJ SOLN
INTRAMUSCULAR | Status: DC | PRN
  Administered 2019-05-26: 8 mg via INTRAVENOUS

## 2019-05-26 MED ORDER — PHENYLEPHRINE HCL-NACL 10-0.9 MG/250ML-% IV SOLN
INTRAVENOUS | Status: DC | PRN
  Administered 2019-05-26: 50 ug/min via INTRAVENOUS

## 2019-05-26 MED ORDER — VITAMIN D 25 MCG (1000 UNIT) PO TABS
1000.0000 [IU] | ORAL_TABLET | Freq: Every day | ORAL | Status: DC
  Administered 2019-05-26 – 2019-05-28 (×3): 1000 [IU] via ORAL
  Filled 2019-05-26 (×3): qty 1

## 2019-05-26 MED ORDER — APIXABAN 5 MG PO TABS
5.0000 mg | ORAL_TABLET | Freq: Two times a day (BID) | ORAL | Status: DC
  Administered 2019-05-27 – 2019-05-28 (×3): 5 mg via ORAL
  Filled 2019-05-26 (×3): qty 1

## 2019-05-26 MED ORDER — SODIUM CHLORIDE 0.9 % IR SOLN
Status: DC | PRN
  Administered 2019-05-26: 1000 mL

## 2019-05-26 MED ORDER — METOCLOPRAMIDE HCL 5 MG PO TABS: 5.0000 mg | ORAL_TABLET | Freq: Three times a day (TID) | ORAL | Status: DC | PRN

## 2019-05-26 MED ORDER — ONDANSETRON HCL 4 MG PO TABS: 4.0000 mg | ORAL_TABLET | Freq: Four times a day (QID) | ORAL | Status: DC | PRN

## 2019-05-26 MED ORDER — PROPOFOL 10 MG/ML IV BOLUS
INTRAVENOUS | Status: AC
  Filled 2019-05-26: qty 20

## 2019-05-26 MED ORDER — PROPOFOL 1000 MG/100ML IV EMUL
INTRAVENOUS | Status: AC
  Filled 2019-05-26: qty 100

## 2019-05-26 MED ORDER — TRANEXAMIC ACID-NACL 1000-0.7 MG/100ML-% IV SOLN
1000.0000 mg | Freq: Once | INTRAVENOUS | Status: AC
  Administered 2019-05-26: 1000 mg via INTRAVENOUS
  Filled 2019-05-26: qty 100

## 2019-05-26 MED ORDER — FUROSEMIDE 20 MG PO TABS
20.0000 mg | ORAL_TABLET | ORAL | Status: DC
  Administered 2019-05-27: 20 mg via ORAL
  Filled 2019-05-26 (×2): qty 1

## 2019-05-26 MED ORDER — ALUM & MAG HYDROXIDE-SIMETH 200-200-20 MG/5ML PO SUSP: 30.0000 mL | ORAL | Status: DC | PRN

## 2019-05-26 MED ORDER — PHENOL 1.4 % MT LIQD: 1.0000 | OROMUCOSAL | Status: DC | PRN

## 2019-05-26 MED ORDER — ROCURONIUM BROMIDE 10 MG/ML (PF) SYRINGE
PREFILLED_SYRINGE | INTRAVENOUS | Status: AC
  Filled 2019-05-26: qty 10

## 2019-05-26 MED ORDER — GABAPENTIN 300 MG PO CAPS: 600.0000 mg | ORAL_CAPSULE | Freq: Every day | ORAL | Status: DC | PRN

## 2019-05-26 MED ORDER — POVIDONE-IODINE 10 % EX SWAB
2.0000 "application " | Freq: Once | CUTANEOUS | Status: AC
  Administered 2019-05-26: 2 via TOPICAL

## 2019-05-26 MED ORDER — ALBUMIN HUMAN 5 % IV SOLN: INTRAVENOUS | Status: DC | PRN

## 2019-05-26 MED ORDER — SPIRONOLACTONE 25 MG PO TABS
25.0000 mg | ORAL_TABLET | Freq: Every day | ORAL | Status: DC
  Administered 2019-05-26 – 2019-05-28 (×3): 25 mg via ORAL
  Filled 2019-05-26 (×4): qty 1

## 2019-05-26 MED ORDER — LACTATED RINGERS IV SOLN: INTRAVENOUS | Status: DC | PRN

## 2019-05-26 MED ORDER — ZOLPIDEM TARTRATE 5 MG PO TABS: 5.0000 mg | ORAL_TABLET | Freq: Every evening | ORAL | Status: DC | PRN

## 2019-05-26 MED ORDER — TRANEXAMIC ACID-NACL 1000-0.7 MG/100ML-% IV SOLN
1000.0000 mg | INTRAVENOUS | Status: AC
  Administered 2019-05-26: 1000 mg via INTRAVENOUS
  Filled 2019-05-26: qty 100

## 2019-05-26 MED ORDER — ALBUMIN HUMAN 5 % IV SOLN
INTRAVENOUS | Status: AC
  Filled 2019-05-26: qty 250

## 2019-05-26 MED ORDER — DEXMEDETOMIDINE HCL IN NACL 200 MCG/50ML IV SOLN
INTRAVENOUS | Status: AC
  Filled 2019-05-26: qty 50

## 2019-05-26 MED ORDER — CEFAZOLIN SODIUM-DEXTROSE 2-4 GM/100ML-% IV SOLN
2.0000 g | INTRAVENOUS | Status: AC
  Administered 2019-05-26: 2 g via INTRAVENOUS
  Filled 2019-05-26: qty 100

## 2019-05-26 MED ORDER — FENTANYL CITRATE (PF) 250 MCG/5ML IJ SOLN
INTRAMUSCULAR | Status: DC | PRN
  Administered 2019-05-26 (×2): 50 ug via INTRAVENOUS
  Administered 2019-05-26: 100 ug via INTRAVENOUS
  Administered 2019-05-26: 50 ug via INTRAVENOUS

## 2019-05-26 MED ORDER — SUCCINYLCHOLINE CHLORIDE 200 MG/10ML IV SOSY
PREFILLED_SYRINGE | INTRAVENOUS | Status: AC
  Filled 2019-05-26: qty 10

## 2019-05-26 MED ORDER — FENTANYL CITRATE (PF) 250 MCG/5ML IJ SOLN
INTRAMUSCULAR | Status: AC
  Filled 2019-05-26: qty 5

## 2019-05-26 MED ORDER — MAGNESIUM CITRATE PO SOLN: 1.0000 | Freq: Once | ORAL | Status: DC | PRN

## 2019-05-26 MED ORDER — METHOCARBAMOL 500 MG IVPB - SIMPLE MED
500.0000 mg | Freq: Four times a day (QID) | INTRAVENOUS | Status: DC | PRN
  Administered 2019-05-26: 500 mg via INTRAVENOUS
  Filled 2019-05-26: qty 50

## 2019-05-26 MED ORDER — SUGAMMADEX SODIUM 200 MG/2ML IV SOLN
INTRAVENOUS | Status: DC | PRN
  Administered 2019-05-26: 200 mg via INTRAVENOUS

## 2019-05-26 MED ORDER — PROPOFOL 10 MG/ML IV BOLUS
INTRAVENOUS | Status: DC | PRN
  Administered 2019-05-26: 180 mg via INTRAVENOUS

## 2019-05-26 MED ORDER — STERILE WATER FOR IRRIGATION IR SOLN
Status: DC | PRN
  Administered 2019-05-26: 2000 mL

## 2019-05-26 MED ORDER — KETOROLAC TROMETHAMINE 15 MG/ML IJ SOLN
7.5000 mg | Freq: Four times a day (QID) | INTRAMUSCULAR | Status: AC
  Administered 2019-05-26 – 2019-05-27 (×4): 7.5 mg via INTRAVENOUS
  Filled 2019-05-26 (×4): qty 1

## 2019-05-26 MED ORDER — DIPHENHYDRAMINE HCL 12.5 MG/5ML PO ELIX: 12.5000 mg | ORAL_SOLUTION | ORAL | Status: DC | PRN

## 2019-05-26 MED ORDER — POTASSIUM CHLORIDE IN NACL 20-0.45 MEQ/L-% IV SOLN
INTRAVENOUS | Status: DC
  Filled 2019-05-26 (×2): qty 1000

## 2019-05-26 MED ORDER — FENTANYL CITRATE (PF) 100 MCG/2ML IJ SOLN
25.0000 ug | INTRAMUSCULAR | Status: DC | PRN
  Administered 2019-05-26: 50 ug via INTRAVENOUS

## 2019-05-26 MED ORDER — SODIUM CHLORIDE 0.9 % IV SOLN: 10.0000 mL/h | Freq: Once | INTRAVENOUS | Status: DC

## 2019-05-26 MED ORDER — ONDANSETRON HCL 4 MG/2ML IJ SOLN
INTRAMUSCULAR | Status: AC
  Filled 2019-05-26: qty 2

## 2019-05-26 MED ORDER — ONDANSETRON HCL 4 MG/2ML IJ SOLN: 4.0000 mg | Freq: Four times a day (QID) | INTRAMUSCULAR | Status: DC | PRN

## 2019-05-26 MED ORDER — INSULIN ASPART 100 UNIT/ML ~~LOC~~ SOLN
0.0000 [IU] | Freq: Three times a day (TID) | SUBCUTANEOUS | Status: DC
  Administered 2019-05-26: 3 [IU] via SUBCUTANEOUS
  Administered 2019-05-27: 2 [IU] via SUBCUTANEOUS
  Administered 2019-05-27: 5 [IU] via SUBCUTANEOUS
  Administered 2019-05-28: 3 [IU] via SUBCUTANEOUS

## 2019-05-26 MED ORDER — LIDOCAINE 2% (20 MG/ML) 5 ML SYRINGE
INTRAMUSCULAR | Status: AC
  Filled 2019-05-26: qty 5

## 2019-05-26 MED ORDER — METHOCARBAMOL 500 MG PO TABS
500.0000 mg | ORAL_TABLET | Freq: Four times a day (QID) | ORAL | Status: DC | PRN
  Administered 2019-05-26 – 2019-05-28 (×7): 500 mg via ORAL
  Filled 2019-05-26 (×7): qty 1

## 2019-05-26 MED ORDER — FENTANYL CITRATE (PF) 100 MCG/2ML IJ SOLN
INTRAMUSCULAR | Status: AC
  Administered 2019-05-26: 50 ug via INTRAVENOUS
  Filled 2019-05-26: qty 2

## 2019-05-26 MED ORDER — LISINOPRIL 10 MG PO TABS
10.0000 mg | ORAL_TABLET | Freq: Every day | ORAL | Status: DC
  Administered 2019-05-26 – 2019-05-28 (×3): 10 mg via ORAL
  Filled 2019-05-26 (×3): qty 1

## 2019-05-26 MED ORDER — KETOROLAC TROMETHAMINE 30 MG/ML IJ SOLN
INTRAMUSCULAR | Status: AC
  Filled 2019-05-26: qty 1

## 2019-05-26 MED ORDER — ACETAMINOPHEN 500 MG PO TABS
1000.0000 mg | ORAL_TABLET | Freq: Four times a day (QID) | ORAL | Status: AC
  Administered 2019-05-26 – 2019-05-27 (×4): 1000 mg via ORAL
  Filled 2019-05-26 (×4): qty 2

## 2019-05-26 MED ORDER — SENNA-DOCUSATE SODIUM 8.6-50 MG PO TABS: 2.0000 | ORAL_TABLET | Freq: Every day | ORAL | 1 refills | Status: DC

## 2019-05-26 MED ORDER — NALOXONE HCL 0.4 MG/ML IJ SOLN
INTRAMUSCULAR | Status: DC | PRN
  Administered 2019-05-26: 80 ug via INTRAVENOUS

## 2019-05-26 MED ORDER — CEFAZOLIN SODIUM-DEXTROSE 2-4 GM/100ML-% IV SOLN
2.0000 g | Freq: Four times a day (QID) | INTRAVENOUS | Status: AC
  Administered 2019-05-26 (×2): 2 g via INTRAVENOUS
  Filled 2019-05-26 (×2): qty 100

## 2019-05-26 MED ORDER — ROPINIROLE HCL 0.5 MG PO TABS: 0.5000 mg | ORAL_TABLET | Freq: Every day | ORAL | Status: DC | PRN

## 2019-05-26 MED ORDER — BUPIVACAINE HCL (PF) 0.25 % IJ SOLN
INTRAMUSCULAR | Status: DC | PRN
  Administered 2019-05-26: 30 mL

## 2019-05-26 MED ORDER — AMIODARONE HCL 100 MG PO TABS
100.0000 mg | ORAL_TABLET | ORAL | Status: DC
  Administered 2019-05-26 – 2019-05-27 (×2): 100 mg via ORAL
  Filled 2019-05-26 (×2): qty 1

## 2019-05-26 MED ORDER — HYDROMORPHONE HCL 1 MG/ML IJ SOLN
0.5000 mg | INTRAMUSCULAR | Status: DC | PRN
  Administered 2019-05-26 (×2): 0.5 mg via INTRAVENOUS
  Filled 2019-05-26: qty 1

## 2019-05-26 MED ORDER — CANAGLIFLOZIN 100 MG PO TABS
100.0000 mg | ORAL_TABLET | Freq: Every day | ORAL | Status: DC
  Administered 2019-05-27 – 2019-05-28 (×2): 100 mg via ORAL
  Filled 2019-05-26 (×2): qty 1

## 2019-05-26 SURGICAL SUPPLY — 59 items
ARTICULEZE HEAD (Hips) ×3 IMPLANT
BIT DRILL 2.0X128 (BIT) ×2 IMPLANT
BIT DRILL 2.0X128MM (BIT) ×1
BLADE SAW SAG 73X25 THK (BLADE) ×1
BLADE SAW SGTL 73X25 THK (BLADE) ×2 IMPLANT
CLOSURE STERI-STRIP 1/2X4 (GAUZE/BANDAGES/DRESSINGS) ×2
CLSR STERI-STRIP ANTIMIC 1/2X4 (GAUZE/BANDAGES/DRESSINGS) ×4 IMPLANT
COVER SURGICAL LIGHT HANDLE (MISCELLANEOUS) ×3 IMPLANT
COVER WAND RF STERILE (DRAPES) IMPLANT
CUP ACET PNNCL SECTR W/GRIP 56 (Hips) ×1 IMPLANT
DRAPE INCISE IOBAN 66X45 STRL (DRAPES) ×3 IMPLANT
DRAPE ORTHO SPLIT 77X108 STRL (DRAPES) ×4
DRAPE POUCH INSTRU U-SHP 10X18 (DRAPES) ×3 IMPLANT
DRAPE SHEET LG 3/4 BI-LAMINATE (DRAPES) ×3 IMPLANT
DRAPE SURG 17X11 SM STRL (DRAPES) ×3 IMPLANT
DRAPE SURG ORHT 6 SPLT 77X108 (DRAPES) ×2 IMPLANT
DRAPE U-SHAPE 47X51 STRL (DRAPES) ×3 IMPLANT
DRSG MEPILEX BORDER 4X8 (GAUZE/BANDAGES/DRESSINGS) ×3 IMPLANT
DURAPREP 26ML APPLICATOR (WOUND CARE) ×6 IMPLANT
ELECT BLADE TIP CTD 4 INCH (ELECTRODE) ×3 IMPLANT
ELECT REM PT RETURN 15FT ADLT (MISCELLANEOUS) ×3 IMPLANT
ELIMINATOR HOLE APEX DEPUY (Hips) ×3 IMPLANT
FACESHIELD WRAPAROUND (MASK) ×3 IMPLANT
GLOVE BIO SURGEON STRL SZ7 (GLOVE) ×3 IMPLANT
GLOVE BIO SURGEON STRL SZ7.5 (GLOVE) ×3 IMPLANT
GLOVE BIOGEL PI IND STRL 7.0 (GLOVE) ×1 IMPLANT
GLOVE BIOGEL PI IND STRL 8 (GLOVE) ×1 IMPLANT
GLOVE BIOGEL PI INDICATOR 7.0 (GLOVE) ×2
GLOVE BIOGEL PI INDICATOR 8 (GLOVE) ×2
GOWN STRL REUS W/TWL LRG LVL3 (GOWN DISPOSABLE) ×6 IMPLANT
HEAD ARTICULEZE (Hips) ×1 IMPLANT
HOOD PEEL AWAY FLYTE STAYCOOL (MISCELLANEOUS) ×9 IMPLANT
KIT BASIN OR (CUSTOM PROCEDURE TRAY) ×3 IMPLANT
KIT TURNOVER KIT A (KITS) ×3 IMPLANT
MANIFOLD NEPTUNE II (INSTRUMENTS) ×3 IMPLANT
NDL SAFETY ECLIPSE 18X1.5 (NEEDLE) ×2 IMPLANT
NEEDLE HYPO 18GX1.5 SHARP (NEEDLE) ×4
NS IRRIG 1000ML POUR BTL (IV SOLUTION) ×3 IMPLANT
PACK TOTAL JOINT (CUSTOM PROCEDURE TRAY) ×3 IMPLANT
PENCIL SMOKE EVACUATOR (MISCELLANEOUS) ×3 IMPLANT
PINN SECTOR W/GRIP ACE CUP 56 (Hips) ×3 IMPLANT
PINNACLE ALTRX PLUS 4 N 36X56 (Hips) ×3 IMPLANT
PROTECTOR NERVE ULNAR (MISCELLANEOUS) ×3 IMPLANT
RETRIEVER SUT HEWSON (MISCELLANEOUS) ×3 IMPLANT
STEM OFFSET DUOFIX SZ6 STD (Stem) ×3 IMPLANT
SUCTION FRAZIER HANDLE 12FR (TUBING) ×2
SUCTION TUBE FRAZIER 12FR DISP (TUBING) ×1 IMPLANT
SUT FIBERWIRE #2 38 REV NDL BL (SUTURE) ×9
SUT VIC AB 0 CT1 36 (SUTURE) ×3 IMPLANT
SUT VIC AB 1 CT1 36 (SUTURE) ×6 IMPLANT
SUT VIC AB 2-0 CT1 27 (SUTURE) ×4
SUT VIC AB 2-0 CT1 TAPERPNT 27 (SUTURE) ×2 IMPLANT
SUT VIC AB 3-0 SH 8-18 (SUTURE) ×3 IMPLANT
SUTURE FIBERWR#2 38 REV NDL BL (SUTURE) ×3 IMPLANT
SYR CONTROL 10ML LL (SYRINGE) ×6 IMPLANT
TOWEL OR 17X26 10 PK STRL BLUE (TOWEL DISPOSABLE) ×3 IMPLANT
TRAY FOLEY MTR SLVR 16FR STAT (SET/KITS/TRAYS/PACK) ×3 IMPLANT
WATER STERILE IRR 1000ML POUR (IV SOLUTION) ×6 IMPLANT
YANKAUER SUCT BULB TIP 10FT TU (MISCELLANEOUS) ×3 IMPLANT

## 2019-05-26 NOTE — Discharge Instructions (Signed)
INSTRUCTIONS AFTER JOINT REPLACEMENT   o Remove items at home which could result in a fall. This includes throw rugs or furniture in walking pathways o ICE to the affected joint every three hours while awake for 30 minutes at a time, for at least the first 3-5 days, and then as needed for pain and swelling.  Continue to use ice for pain and swelling. You may notice swelling that will progress down to the foot and ankle.  This is normal after surgery.  Elevate your leg when you are not up walking on it.   o Continue to use the breathing machine you got in the hospital (incentive spirometer) which will help keep your temperature down.  It is common for your temperature to cycle up and down following surgery, especially at night when you are not up moving around and exerting yourself.  The breathing machine keeps your lungs expanded and your temperature down.   DIET:  As you were doing prior to hospitalization, we recommend a well-balanced diet.  DRESSING / WOUND CARE / SHOWERING  You may change your dressing 3-5 days after surgery.  Then change the dressing every day with sterile gauze.  Please use good hand washing techniques before changing the dressing.  Do not use any lotions or creams on the incision until instructed by your surgeon.  ACTIVITY  o Increase activity slowly as tolerated, but follow the weight bearing instructions below.   o No driving for 6 weeks or until further direction given by your physician.  You cannot drive while taking narcotics.  o No lifting or carrying greater than 10 lbs. until further directed by your surgeon. o Avoid periods of inactivity such as sitting longer than an hour when not asleep. This helps prevent blood clots.  o You may return to work once you are authorized by your doctor.     WEIGHT BEARING   Weight bearing as tolerated with assist device (walker, cane, etc) as directed, use it as long as suggested by your surgeon or therapist, typically at  least 4-6 weeks.   EXERCISES  Results after joint replacement surgery are often greatly improved when you follow the exercise, range of motion and muscle strengthening exercises prescribed by your doctor. Safety measures are also important to protect the joint from further injury. Any time any of these exercises cause you to have increased pain or swelling, decrease what you are doing until you are comfortable again and then slowly increase them. If you have problems or questions, call your caregiver or physical therapist for advice.   Rehabilitation is important following a joint replacement. After just a few days of immobilization, the muscles of the leg can become weakened and shrink (atrophy).  These exercises are designed to build up the tone and strength of the thigh and leg muscles and to improve motion. Often times heat used for twenty to thirty minutes before working out will loosen up your tissues and help with improving the range of motion but do not use heat for the first two weeks following surgery (sometimes heat can increase post-operative swelling).   These exercises can be done on a training (exercise) mat, on the floor, on a table or on a bed. Use whatever works the best and is most comfortable for you.    Use music or television while you are exercising so that the exercises are a pleasant break in your day. This will make your life better with the exercises acting as a break   in your routine that you can look forward to.   Perform all exercises about fifteen times, three times per day or as directed.  You should exercise both the operative leg and the other leg as well.  Exercises include:   . Quad Sets - Tighten up the muscle on the front of the thigh (Quad) and hold for 5-10 seconds.   . Straight Leg Raises - With your knee straight (if you were given a brace, keep it on), lift the leg to 60 degrees, hold for 3 seconds, and slowly lower the leg.  Perform this exercise against  resistance later as your leg gets stronger.  . Leg Slides: Lying on your back, slowly slide your foot toward your buttocks, bending your knee up off the floor (only go as far as is comfortable). Then slowly slide your foot back down until your leg is flat on the floor again.  . Angel Wings: Lying on your back spread your legs to the side as far apart as you can without causing discomfort.  . Hamstring Strength:  Lying on your back, push your heel against the floor with your leg straight by tightening up the muscles of your buttocks.  Repeat, but this time bend your knee to a comfortable angle, and push your heel against the floor.  You may put a pillow under the heel to make it more comfortable if necessary.   A rehabilitation program following joint replacement surgery can speed recovery and prevent re-injury in the future due to weakened muscles. Contact your doctor or a physical therapist for more information on knee rehabilitation.    CONSTIPATION  Constipation is defined medically as fewer than three stools per week and severe constipation as less than one stool per week.  Even if you have a regular bowel pattern at home, your normal regimen is likely to be disrupted due to multiple reasons following surgery.  Combination of anesthesia, postoperative narcotics, change in appetite and fluid intake all can affect your bowels.   YOU MUST use at least one of the following options; they are listed in order of increasing strength to get the job done.  They are all available over the counter, and you may need to use some, POSSIBLY even all of these options:    Drink plenty of fluids (prune juice may be helpful) and high fiber foods Colace 100 mg by mouth twice a day  Senokot for constipation as directed and as needed Dulcolax (bisacodyl), take with full glass of water  Miralax (polyethylene glycol) once or twice a day as needed.  If you have tried all these things and are unable to have a bowel  movement in the first 3-4 days after surgery call either your surgeon or your primary doctor.    If you experience loose stools or diarrhea, hold the medications until you stool forms back up.  If your symptoms do not get better within 1 week or if they get worse, check with your doctor.  If you experience "the worst abdominal pain ever" or develop nausea or vomiting, please contact the office immediately for further recommendations for treatment.   ITCHING:  If you experience itching with your medications, try taking only a single pain pill, or even half a pain pill at a time.  You can also use Benadryl over the counter for itching or also to help with sleep.   TED HOSE STOCKINGS:  Use stockings on both legs until for at least 2 weeks or as   directed by physician office. They may be removed at night for sleeping.  MEDICATIONS:  See your medication summary on the "After Visit Summary" that nursing will review with you.  You may have some home medications which will be placed on hold until you complete the course of blood thinner medication.  It is important for you to complete the blood thinner medication as prescribed.  PRECAUTIONS:  If you experience chest pain or shortness of breath - call 911 immediately for transfer to the hospital emergency department.   If you develop a fever greater that 101 F, purulent drainage from wound, increased redness or drainage from wound, foul odor from the wound/dressing, or calf pain - CONTACT YOUR SURGEON.                                                   FOLLOW-UP APPOINTMENTS:  If you do not already have a post-op appointment, please call the office for an appointment to be seen by your surgeon.  Guidelines for how soon to be seen are listed in your "After Visit Summary", but are typically between 1-4 weeks after surgery.  OTHER INSTRUCTIONS:    DENTAL ANTIBIOTICS:  In most cases prophylactic antibiotics for Dental procdeures after total joint surgery are  not necessary.  Exceptions are as follows:  1. History of prior total joint infection  2. Severely immunocompromised (Organ Transplant, cancer chemotherapy, Rheumatoid biologic meds such as Humera)  3. Poorly controlled diabetes (A1C &gt; 8.0, blood glucose over 200)  If you have one of these conditions, contact your surgeon for an antibiotic prescription, prior to your dental procedure.   MAKE SURE YOU:  . Understand these instructions.  . Get help right away if you are not doing well or get worse.    Thank you for letting us be a part of your medical care team.  It is a privilege we respect greatly.  We hope these instructions will help you stay on track for a fast and full recovery!   

## 2019-05-26 NOTE — H&P (Signed)
PREOPERATIVE H&P  Chief Complaint: left hip pain   HPI: Walter [name] is a 73 y.o. male who presents for preoperative history and physical with a diagnosis of left hip oa. Symptoms are rated as moderate to severe, and have been worsening.  This is significantly impairing activities of daily living.  He has elected for surgical management.   He has failed injections, activity modification, anti-inflammatories, and assistive devices.  Preoperative X-rays demonstrate end stage degenerative changes with osteophyte formation, loss of joint space, subchondral sclerosis.   Past Medical History:  Diagnosis Date  . AICD (automatic cardioverter/defibrillator) present   . Anxiety   . Asbestosis ([place])    mild  . Atrial fibrillation ([place])   . Chronic low back pain    /sciatica- Dr Letta Median [facility])  . Complication of anesthesia 1996   previous surgery had a run of v-tach (for gallbladder-1990's)-cannot lie completely flat and panis when oxygen mask placed over face  . Coronary artery disease, non-occlusive June 2011   Cardiac cath in [facility] following abnormal Myoview  . Delirium 05/06/2019  . DJD (degenerative joint disease)    /osteoarthritis  . Dyslipidemia, goal LDL below 100    On lovastatin (myalgias with Zocor and Lipitor)  . Dyspnea    worse in hot weather  . Dysrhythmia    LBBB, in 2015-atrial fibrillation  . Essential hypertension   . 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 Cherrie Gauze Marrow- no problems with this  . Frozen shoulder    right  . GERD (gastroesophageal reflux disease)   . H/O asbestosis    when in [employer]-1964-1970,1971-1986 in [employer]-diagnosed in 1990  . History of kidney stones   . Hypogonadism male   . Irritable bowel   . Kidney stone   . Left bundle branch block (LBBB) on electrocardiogram    Diagnosed close to 20 years ago  . Myocardial infarction ([place])   . Non-ischemic cardiomyopathy [facility]) June 2011   EF 45-50%; suspect related to  LBBB; repeat Echo March 2015: EF 50- 55%  . Obesity   . PONV (postoperative nausea and vomiting)   . Restless leg syndrome   . Rotator cuff arthropathy of right shoulder 05/14/2017  . Testicular cancer ([place]) 1972   left  . Tremor, essential 12/09/2015  . Type 2 diabetes mellitus without complication ([place]) [id number]   Type 2.    Past Surgical History:  Procedure Laterality Date  . APPENDECTOMY N/A   . BI-VENTRICULAR IMPLANTABLE CARDIOVERTER DEFIBRILLATOR N/A 02/04/2014   SJM Quadra Assura BiV ICD implanted for primary prevention by [name]  . CARDIAC CATHETERIZATION  June 2011   Nonobstructive CAD  . CARDIAC DEFIBRILLATOR PLACEMENT  02/04/14   [facility] model [place] 3365-40Q (serial Number [id number]) biventricular ICD.  Marland Kitchen CARDIOVERSION N/A 10/13/2013   Procedure: CARDIOVERSION;  Surgeon: Larey Dresser, MD;  Location: [place];  Service: Cardiovascular;  Laterality: N/A;  . CARDIOVERSION  10-16-2013   DCCV by [name] following intracardiac echo to rule out LAA thrombus  . CARDIOVERSION N/A 10/16/2013   Procedure: CARDIOVERSION;  Surgeon: Coralyn Mark, MD;  Location: [facility] CATH LAB;  Service: Cardiovascular;  Laterality: N/A;  . CHOLECYSTECTOMY OPEN    . ELECTROPHYSIOLOGY STUDY N/A 10/16/2013   Procedure: ELECTROPHYSIOLOGY STUDY;  Surgeon: Coralyn Mark, MD;  Location: [facility] CATH LAB;  Service: Cardiovascular;  Laterality: N/A;  . ESOPHAGOGASTRODUODENOSCOPY N/A 10/15/2013   Procedure: ESOPHAGOGASTRODUODENOSCOPY (EGD);  Surgeon: Gatha Mayer,  MD;  Location: [place];  Service: Endoscopy;  Laterality: N/A;  bedside  . HERNIA REPAIR    . KNEE ARTHROSCOPY Left   . LEFT AND RIGHT HEART CATHETERIZATION WITH CORONARY ANGIOGRAM N/A 10/12/2013   Procedure: LEFT AND RIGHT HEART CATHETERIZATION WITH CORONARY ANGIOGRAM;  Surgeon: Troy Sine, MD;  Location: [facility] CATH LAB;  Service: Cardiovascular;  Laterality: N/A;  . lymph nodes    . RADIOFREQUENCY ABLATION NERVES     Back every 6  months  . REVERSE SHOULDER ARTHROPLASTY Right 05/14/2017   Procedure: TOTAL REVERSE SHOULDER ARTHROPLASTY;  Surgeon: Marchia Bond, MD;  Location: [place];  Service: Orthopedics;  Laterality: Right;  . ROTATOR CUFF REPAIR Right   . SURGERY SCROTAL / TESTICULAR Left   . TONSILLECTOMY Bilateral   . TOTAL HIP ARTHROPLASTY Right 02/22/2015   Procedure: RIGHT TOTAL HIP ARTHROPLASTY ANTERIOR APPROACH;  Surgeon: Renette Butters, MD;  Location: [place];  Service: Orthopedics;  Laterality: Right;  . TRANSTHORACIC ECHOCARDIOGRAM  May 2011   EF 45% with diffuse hypokinesis; septal bounce from LBBB  . TRANSTHORACIC ECHOCARDIOGRAM  March 2015   EF 50-55%. Mild concentric LVH. Septal dyssynergy from LBBB    Social History   Socioeconomic History  . Marital status: Married    Spouse name: Not on file  . Number of children: 2  . Years of education: 70  . Highest education level: Not on file  Occupational History  . Occupation: Retired  Tobacco Use  . Smoking status: Former Smoker    Types: Cigarettes    Quit date: 02/10/1973    Years since quitting: 46.3  . Smokeless tobacco: Never Used  Substance and Sexual Activity  . Alcohol use: Yes    Comment: rare  . Drug use: No  . Sexual activity: Not on file    Comment: Married  Other Topics Concern  . Not on file  Social History Narrative   Lives at home w/ his wife, daughter and granddaughter   Left-handed   Caffeine: 1-2 diet sodas per day   Social Determinants of Health   Financial Resource Strain:   . Difficulty of Paying Living Expenses: Not on file  Food Insecurity:   . Worried About Charity fundraiser in the Last Year: Not on file  . Ran Out of Food in the Last Year: Not on file  Transportation Needs:   . Lack of Transportation (Medical): Not on file  . Lack of Transportation (Non-Medical): Not on file  Physical Activity:   . Days of Exercise per Week: Not on file  . Minutes of Exercise per Session: Not on file  Stress:   .  Feeling of Stress : Not on file  Social Connections:   . Frequency of Communication with Friends and Family: Not on file  . Frequency of Social Gatherings with Friends and Family: Not on file  . Attends Religious Services: Not on file  . Active Member of Clubs or Organizations: Not on file  . Attends Archivist Meetings: Not on file  . Marital Status: Not on file   Family History  Problem Relation Age of Onset  . Hypertension Mother   . Heart disease Mother   . Heart disease Father   . Hypertension Brother   . Heart attack Maternal Uncle   . Hypertension Brother   . Heart disease Paternal Uncle    Allergies  Allergen Reactions  . Statins Other (See Comments)    Myalgias.  Can only take  Altoprev   . Cortisone Other (See Comments)    Received Cortisone injection in left hip on Friday before Thanksgiving ,2020 and the next day, does not remember 3 days as he zoned out. Physician informed him to let Anesthesia know this.   Prior to Admission medications   Medication Sig Start Date End Date Taking? Authorizing Provider  amiodarone (PACERONE) 200 MG tablet TAKE ONE-HALF (1/2) TABLET DAILY Patient taking differently: Take 100 mg by mouth daily after supper. At 8 pm 03/10/19  Yes Larey Dresser, MD  apixaban (ELIQUIS) 5 MG TABS tablet Take 5 mg by mouth 2 (two) times daily.    Yes [provider]  carvedilol (COREG) 12.5 MG tablet TAKE 1 TABLET TWICE A DAY Patient taking differently: Take 12.5 mg by mouth 2 (two) times daily with a meal.  04/29/19  Yes Larey Dresser, MD  cholecalciferol (VITAMIN D3) 25 MCG (1000 UNIT) tablet Take 1,000 Units by mouth daily.   Yes [provider]  empagliflozin (JARDIANCE) 10 MG TABS tablet Take 25 mg by mouth daily.    Yes [provider]  esomeprazole (NEXIUM) 40 MG capsule Take 40 mg by mouth daily after breakfast.  11/19/18  Yes [provider]  furosemide (LASIX) 20 MG tablet Take 1 tablet (20 mg total)  by mouth every other day. 02/17/19  Yes Larey Dresser, MD  gabapentin (NEURONTIN) 300 MG capsule Take 600 mg by mouth daily as needed (pain).    Yes [provider]  lisinopril (ZESTRIL) 10 MG tablet Take 10 mg by mouth daily.    Yes [provider]  metFORMIN (GLUCOPHAGE) 500 MG tablet Take 1,000 mg by mouth 2 (two) times daily with a meal.    Yes [provider]  omega-3 acid ethyl esters (LOVAZA) 1 G capsule Take 1 g by mouth 2 (two) times daily.    Yes [provider]  oxyCODONE-acetaminophen (PERCOCET) 10-325 MG tablet Take 1-2 tablets by mouth every 6 (six) hours as needed (FOR PAIN.). Patient taking differently: Take 1-2 tablets by mouth every 4 (four) hours as needed for pain.  05/14/17  Yes Marchia Bond, MD  rOPINIRole (REQUIP) 0.5 MG tablet Take 0.5 mg by mouth daily as needed (restless leg).   Yes [provider]  rOPINIRole (REQUIP) 1 MG tablet Take 1 mg by mouth at bedtime.  11/28/15  Yes [provider]  sildenafil (VIAGRA) 100 MG tablet Take 100 mg by mouth daily as needed for erectile dysfunction.   Yes [provider]  spironolactone (ALDACTONE) 25 MG tablet TAKE 1 TABLET DAILY IN THE MORNING Patient taking differently: Take 25 mg by mouth daily. TAKE 1 TABLET DAILY IN THE MORNING 03/10/19  Yes Larey Dresser, MD  testosterone cypionate (DEPO-TESTOSTERONE) 200 MG/ML injection Inject 200 mg into the muscle See admin instructions. Inject 200 mg into the muscle every 10 days   Yes [provider]  tiZANidine (ZANAFLEX) 4 MG capsule Take 4 mg by mouth 2 (two) times daily as needed for muscle spasms.    Yes [provider]  albuterol (PROVENTIL HFA;VENTOLIN HFA) 108 (90 Base) MCG/ACT inhaler Inhale 1-2 puffs into the lungs every 6 (six) hours as needed for wheezing or shortness of breath.    [provider]     Positive ROS: All other systems have been reviewed and were otherwise negative with  the exception of those mentioned in the HPI and as above.  Physical Exam: General: Alert, no acute distress  Cardiovascular: No pedal edema Respiratory: No cyanosis, no use of accessory musculature GI: No organomegaly, abdomen is soft and non-tender Skin: No lesions in the area of chief complaint Neurologic: Sensation intact distally Psychiatric: Patient is competent for consent with normal mood and affect Lymphatic: No axillary or cervical lymphadenopathy  MUSCULOSKELETAL: left hip painful arc with no IR  Assessment: Left hip oa   Plan: Plan for Procedure(s): TOTAL HIP ARTHROPLASTY  The risks benefits and alternatives were discussed with the patient including but not limited to the risks of nonoperative treatment, versus surgical intervention including infection, bleeding, nerve injury,  blood clots, cardiopulmonary complications, morbidity, mortality, among others, and they were willing to proceed.    Patient's anticipated LOS is less than 2 midnights, meeting these requirements: - Younger than 60 - Lives within 1 hour of care - Has a competent adult at home to recover with post-op recover - NO history of  - Chronic pain requiring opiods  - Diabetes  - Coronary Artery Disease  - Heart failure  - Heart attack  - Stroke  - DVT/VTE  - Cardiac arrhythmia  - Respiratory Failure/COPD  - Renal failure  - Anemia  - Advanced Liver disease  Patient feels he went home too early after his last hip, we will assess his mobility postop and pain control.  Took eliquis on Sunday, although hgb 19.  Discussed risks of bleeding, but will proceed.     Johnny Bridge, MD Cell [phone]   05/26/2019 7:20 AM

## 2019-05-26 NOTE — Evaluation (Signed)
Physical Therapy Evaluation  Patient Details Name: Walter [name] MRN: [id number] DOB: 07/31/1945 Today's Date: 05/26/2019   History of Present Illness  Patient is 74 y.o. male s/p Lt THA posterior approach on 05/26/19 with PMH significant for DM, GERD, CAD, A-fib, anxiety, testicular cancer, MI, cardiomyopathy, Rt RCR, pacemaker, Rt THA.    Clinical Impression  SAMEL BUTKOVICH is a 74 y.o. male POD 0 s/p Lt THA posterior approach. Patient reports independence with mobility at baseline. Patient is now limited by functional impairments (see PT problem list below) and requires min assist for transfers and gait with RW. Patient was able to ambulate ~30 feet with RW and min assist. Patient educated on posterior hip precautions and cues provided throughout mobility to maintain. Patient instructed in exercise to facilitate circulation. Patient will benefit from continued skilled PT interventions to address impairments and progress towards PLOF. Acute PT will follow to progress mobility and stair training in preparation for safe discharge home.     Follow Up Recommendations Follow surgeon's recommendation for DC plan and follow-up therapies;Home health PT(pt wants HHPT initially )    Equipment Recommendations  Rolling walker with 5" wheels    Recommendations for Other Services       Precautions / Restrictions Precautions Precautions: Posterior Hip Precaution Booklet Issued: Yes (comment) Restrictions Weight Bearing Restrictions: No      Mobility  Bed Mobility Overal bed mobility: Needs Assistance Bed Mobility: Supine to Sit     Supine to sit: HOB elevated;Mod assist     General bed mobility comments: assit for Lt LE mobility and cues to maintain posterior hip precautions, cues for use of bed rail and assist to raise turnk upright  Transfers Overall transfer level: Needs assistance Equipment used: Rolling walker (2 wheeled) Transfers: Sit to/from Stand Sit to Stand: Min assist;From  elevated surface         General transfer comment: cues for technique and hand placement with RW, assist to initiate power up and steady with rise. verbal cues to maintain posterior hip precautions.  Ambulation/Gait Ambulation/Gait assistance: Min assist Gait Distance (Feet): 30 Feet Assistive device: Rolling walker (2 wheeled) Gait Pattern/deviations: Step-to pattern;Decreased stance time - left;Decreased stride length Gait velocity: decreased   General Gait Details: verbal cues for safe step pattern in RW and for safe proximity to walker.  Stairs       Wheelchair Mobility    Modified Rankin (Stroke Patients Only)       Balance Overall balance assessment: Needs assistance Sitting-balance support: Feet supported Sitting balance-Leahy Scale: Good     Standing balance support: During functional activity;Bilateral upper extremity supported Standing balance-Leahy Scale: Poor Standing balance comment: reliant on UE support              Pertinent Vitals/Pain Pain Assessment: 0-10 Pain Score: 5  Pain Location: Lt hip Pain Descriptors / Indicators: Aching;Sore Pain Intervention(s): Limited activity within patient's tolerance;Monitored during session;Repositioned    Home Living Family/patient expects to be discharged to:: Private residence Living Arrangements: Spouse/significant other;Children Available Help at Discharge: Family Type of Home: House Home Access: Level entry     Home Layout: Two level;Full bath on main level;Able to live on main level with bedroom/bathroom(live in basement ) Home Equipment: Grab bars - tub/shower;Walker - 2 wheels;Cane - single point;Bedside commode Additional Comments: Pt lives in basement apartment of adult childrens house. does not need to go upstairs and has full bath and bedroom in basement.    Prior Function Level of Independence:  Independent               Hand Dominance   Dominant Hand: Right    Extremity/Trunk  Assessment   Upper Extremity Assessment Upper Extremity Assessment: Overall WFL for tasks assessed    Lower Extremity Assessment Lower Extremity Assessment: LLE deficits/detail LLE Deficits / Details: good quad activation in supine LLE: Unable to fully assess due to pain LLE Sensation: WNL LLE Coordination: WNL    Cervical / Trunk Assessment Cervical / Trunk Assessment: Normal  Communication   Communication: No difficulties  Cognition Arousal/Alertness: Awake/alert Behavior During Therapy: WFL for tasks assessed/performed Overall Cognitive Status: Within Functional Limits for tasks assessed             General Comments      Exercises Total Joint Exercises Ankle Circles/Pumps: AROM;Seated;Both;15 reps   Assessment/Plan    PT Assessment Patient needs continued PT services  PT Problem List Decreased strength;Decreased range of motion;Decreased activity tolerance;Decreased balance;Decreased mobility;Decreased knowledge of use of DME;Decreased knowledge of precautions       PT Treatment Interventions DME instruction;Gait training;Stair training;Functional mobility training;Therapeutic activities;Therapeutic exercise;Balance training;Patient/family education    PT Goals (Current goals can be found in the Care Plan section)  Acute Rehab PT Goals Patient Stated Goal: get back to walking without device PT Goal Formulation: With patient Time For Goal Achievement: 06/02/19 Potential to Achieve Goals: Good    Frequency 7X/week    AM-PAC PT "6 Clicks" Mobility  Outcome Measure Help needed turning from your back to your side while in a flat bed without using bedrails?: A Little Help needed moving from lying on your back to sitting on the side of a flat bed without using bedrails?: A Little Help needed moving to and from a bed to a chair (including a wheelchair)?: A Little Help needed standing up from a chair using your arms (e.g., wheelchair or bedside chair)?: A  Little Help needed to walk in hospital room?: A Little Help needed climbing 3-5 steps with a railing? : A Little 6 Click Score: 18    End of Session Equipment Utilized During Treatment: Gait belt Activity Tolerance: Patient tolerated treatment well Patient left: in chair;with call bell/phone within reach;with chair alarm set;with family/visitor present Nurse Communication: Mobility status PT Visit Diagnosis: Muscle weakness (generalized) (M62.81);Difficulty in walking, not elsewhere classified (R26.2)    Time: [id number] PT Time Calculation (min) (ACUTE ONLY): 34 min   Charges:   PT Evaluation $PT Eval Low Complexity: 1 Low PT Treatments $Gait Training: 8-22 mins       Verner Mould, DPT Physical Therapist with [facility] [phone]  05/26/2019 5:34 PM

## 2019-05-26 NOTE — Transfer of Care (Signed)
Immediate Anesthesia Transfer of Care Note  Patient: Walter [name]  Procedure(s) Performed: TOTAL HIP ARTHROPLASTY (Left Hip)  Patient Location: PACU  Anesthesia Type:General  Level of Consciousness: awake, alert , oriented and patient cooperative  Airway & Oxygen Therapy: Patient Spontanous Breathing and Patient connected to face mask oxygen  Post-op Assessment: Report given to RN, Post -op Vital signs reviewed and stable and Patient moving all extremities  Post vital signs: Reviewed and stable  Last Vitals:  Vitals Value Taken Time  BP 146/79 05/26/19 1100  Temp 36.4 C 05/26/19 1055  Pulse 83 05/26/19 1103  Resp 19 05/26/19 1103  SpO2 96 % 05/26/19 1103  Vitals shown include unvalidated device data.  Last Pain:  Vitals:   05/26/19 1055  TempSrc:   PainSc: 0-No pain         Complications: No apparent anesthesia complications

## 2019-05-26 NOTE — Plan of Care (Signed)
  Problem: Education: Goal: Knowledge of the prescribed therapeutic regimen will improve Outcome: Progressing Goal: Understanding of discharge needs will improve Outcome: Progressing Goal: Individualized Educational Video(s) Outcome: Progressing   Problem: Activity: Goal: Ability to avoid complications of mobility impairment will improve Outcome: Progressing Goal: Ability to tolerate increased activity will improve Outcome: Progressing   

## 2019-05-26 NOTE — Progress Notes (Signed)
Significant drainage from operative wound in PACU. Dressing was reinforced in PACU. Due to drainage, I removed reinforcement and mepilex and created new dressing with sterile gauze, abds, and tape. Will plan to change in the AM. Can be reinforced overnight if any significant drainage.

## 2019-05-26 NOTE — Anesthesia Procedure Notes (Signed)
Procedure Name: Intubation Date/Time: 05/26/2019 7:38 AM Performed by: Mitzie Na, CRNA Pre-anesthesia Checklist: Patient identified, Emergency Drugs available, Suction available and Patient being monitored Patient Re-evaluated:Patient Re-evaluated prior to induction Oxygen Delivery Method: Circle system utilized Preoxygenation: Pre-oxygenation with 100% oxygen Induction Type: IV induction Ventilation: Mask ventilation without difficulty and Oral airway inserted - appropriate to patient size Laryngoscope Size: Mac and 4 Grade View: Grade I Tube type: Oral Tube size: 7.5 mm Number of attempts: 1 Airway Equipment and Method: Stylet and Oral airway Placement Confirmation: ETT inserted through vocal cords under direct vision,  positive ETCO2 and breath sounds checked- equal and bilateral Secured at: 24 cm Tube secured with: Tape Dental Injury: Teeth and Oropharynx as per pre-operative assessment

## 2019-05-26 NOTE — Op Note (Signed)
05/26/2019  10:11 AM  PATIENT:  Walter [name]   MRN: [id number]  PRE-OPERATIVE DIAGNOSIS: Left hip primary localized osteoarthritis  POST-OPERATIVE DIAGNOSIS:  same, with chronic left gluteus medius tear  PROCEDURE:  Procedure(s): 1.  LEFT TOTAL HIP ARTHROPLASTY 2.  Left gluteus medius repair Dr. Alvan Dame  PREOPERATIVE INDICATIONS:    Walter [name] is an 74 y.o. male who has a diagnosis of left hip primary localized osteoarthritis and elected for surgical management after failing conservative treatment.  The risks benefits and alternatives were discussed with the patient including but not limited to the risks of nonoperative treatment, versus surgical intervention including infection, bleeding, nerve injury, periprosthetic fracture, the need for revision surgery, dislocation, leg length discrepancy, blood clots, cardiopulmonary complications, morbidity, mortality, among others, and they were willing to proceed.     OPERATIVE REPORT     SURGEON:  Marchia Bond, MD    ASSISTANT:  Merlene Pulling, PA-C, (Present throughout the entire procedure,  necessary for completion of procedure in a timely manner, assisting with retraction, instrumentation, and closure)     ANESTHESIA: General  ESTIMATED BLOOD LOSS: 9147 mL    COMPLICATIONS:  None.     UNIQUE ASPECTS OF THE CASE: He did bleed fairly consistently from his canal, although intraoperative i-STAT had a hemoglobin of 17.  He had a gluteus medius chronic tear which I encountered during the approach which I repaired.    Getting access down the canal was a little bit challenging, the femoral head was quite large, measured a 56.  The 5 stem was loose, but it was very difficult to get the 6 reamer down, but ultimately I did get it fully prepared.  The 6 stem ultimately sat somewhat proud however, despite having decent length on the neck, I used a 5 mm neck length, which seemed to restore his leg length and optimize stability the best.  I debated  about high offset, but radiographically he did not appear to need high offset.  COMPONENTS:  [employer] fit femur size 6 with a 36 mm + 5 metallic head ball and a Gription Acetabular shell size 56 with an apex hole eliminator and a +4 neutral polyethylene liner.    PROCEDURE IN DETAIL:   The patient was met in the holding area and  identified.  The appropriate hip was identified and marked at the operative site.  The patient was then transported to the OR  and  placed under anesthesia.  At that point, the patient was  placed in the lateral decubitus position with the operative side up and  secured to the operating room table and all bony prominences padded.     The operative lower extremity was prepped from the iliac crest to the distal leg.  Sterile draping was performed.  Time out was performed prior to incision.      A routine posterolateral approach was utilized via sharp dissection  carried down to the subcutaneous tissue.  Gross bleeders were Bovie coagulated.  The iliotibial band was identified and incised along the length of the skin incision.  Self-retaining retractors were  inserted.  With the hip internally rotated, the short external rotators  were identified. The piriformis and capsule was tagged with FiberWire, and the hip capsule released in a T-type fashion.  The femoral neck was exposed, and I resected the femoral neck using the appropriate jig. This was performed at approximately a thumb's breadth above the lesser trochanter.    I then exposed  the deep acetabulum, cleared out any tissue including the ligamentum teres.  A wing retractor was placed.  After adequate visualization, I excised the labrum, and then sequentially reamed.  I placed the trial acetabulum, which seated nicely, and then impacted the real cup into place.  Appropriate version and inclination was confirmed clinically matching their bony anatomy, and also with the use of the jig.  I placed a cancellous  screw to augment fixation.  A trial polyethylene liner was placed and the wing retractor removed.    I then prepared the proximal femur using the cookie-cutter, the lateralizing reamer, and then sequentially reamed and broached.  A trial broach, neck, and head was utilized, and I reduced the hip and it was found to have excellent stability with functional range of motion. The trial components were then removed, and the real polyethylene liner was placed.  I then impacted the real femoral prosthesis into place into the appropriate version, slightly anteverted to the normal anatomy, and I impacted the real head ball into place. The hip was then reduced and taken through functional range of motion and found to have excellent stability. Leg lengths were restored.  I then used a #2 FiberWire to place margin convergent sutures through the gluteus medius tendon.  I prepared the tuberosity using a curette abrading the cortical surface for healing.  I then used the #2 FiberWire through the drill holes for the capsular repair, tying over the bone bridge to secure the gluteus medius back to bone.  I then used a 2 mm drill bits to pass the FiberWire suture from the capsule and piriformis through the greater trochanter, and secured this. Excellent posterior capsular repair was achieved. I also closed the T in the capsule.  I then irrigated the hip copiously again with pulse lavage, and repaired the fascia with Vicryl, followed by Vicryl for the subcutaneous tissue, Monocryl for the skin, Steri-Strips and sterile gauze. The wounds were injected. The patient was then awakened and returned to PACU in stable and satisfactory condition. There were no complications.  Marchia Bond, MD Orthopedic Surgeon [phone]   05/26/2019 10:11 AM

## 2019-05-26 NOTE — Progress Notes (Signed)
EPIC Encounter for ICM Monitoring  Patient Name: Walter [name] is a 74 y.o. male Date: 05/26/2019 Primary Care Physican: London Pepper, MD Primary Cardiologist:[place] Electrophysiologist: Allred Bi-V Pacing:98% 05/19/2019 Weight:246-247lbs   He is having hip surgery 05/26/19.    CorVuethoracic impedance returned to baseline normal after taking extra Furosemide.  Furosemide20 mgTake 1 tablet (20 mg total) bymouthevery other day.  Labs: 02/26/2019 Creatinine 1.18, BUN 15, Potassium 4.2, Sodium 140, GFR >60 12/04/2018 Creatinine 1.34, BUN 14, Potassium 4.1, Sodium 136, GFR 52->60 07/21/2018 Creatinine1.43, BUN22, Potassium4.3, Sodium137, [id number] A complete set of results can be found in Results Review.  Recommendations: None  Follow-up plan: ICM clinic phone appointment on3/16/2021 to check fluid levels post op hip surgery.91 day device clinic remote transmission 08/19/2019. Office appt 06/04/2019 withAmber Seiler,NP.   Copy of ICM check sent to Dr.Allred.   3 month ICM trend: 05/25/2019    1 Year ICM trend:       Rosalene Billings, RN 05/26/2019 12:50 PM

## 2019-05-27 ENCOUNTER — Encounter: Payer: Self-pay | Admitting: *Deleted

## 2019-05-27 LAB — BASIC METABOLIC PANEL
Anion gap: 10 (ref 5–15)
BUN: 23 mg/dL (ref 8–23)
CO2: 25 mmol/L (ref 22–32)
Calcium: 8.3 mg/dL — ABNORMAL LOW (ref 8.9–10.3)
Chloride: 100 mmol/L (ref 98–111)
Creatinine, Ser: 1.15 mg/dL (ref 0.61–1.24)
GFR calc Af Amer: >60 mL/min (ref >60)
GFR calc non Af Amer: >60 mL/min (ref >60)
Glucose, Bld: 139 mg/dL — ABNORMAL HIGH (ref 70–99)
Potassium: 3.8 mmol/L (ref 3.5–5.1)
Sodium: 135 mmol/L (ref 135–145)

## 2019-05-27 LAB — CBC
HCT: 47.8 % (ref 39.0–52.0)
Hemoglobin: 15 g/dL (ref 13.0–17.0)
MCH: 31.4 pg (ref 26.0–34.0)
MCHC: 31.4 g/dL (ref 30.0–36.0)
MCV: 100 fL (ref 80.0–100.0)
Platelets: 162 10*3/uL (ref 150–400)
RBC: 4.78 MIL/uL (ref 4.22–5.81)
RDW: 14.4 % (ref 11.5–15.5)
WBC: 9.6 10*3/uL (ref 4.0–10.5)
nRBC: 0 % (ref 0.0–0.2)

## 2019-05-27 LAB — GLUCOSE, CAPILLARY
Glucose-Capillary: 134 mg/dL — ABNORMAL HIGH (ref 70–99)
Glucose-Capillary: 105 mg/dL — ABNORMAL HIGH (ref 70–99)
Glucose-Capillary: 233 mg/dL — ABNORMAL HIGH (ref 70–99)

## 2019-05-27 NOTE — Progress Notes (Signed)
Physical Therapy Treatment Patient Details Name: Walter [name] MRN: [id number] DOB: 08/20/1945 Today's Date: 05/27/2019    History of Present Illness Patient is 74 y.o. male s/p Lt THA posterior approach on 05/26/19 with PMH significant for DM, GERD, CAD, A-fib, anxiety, testicular cancer, MI, cardiomyopathy, Rt RCR, pacemaker, Rt THA.    PT Comments    POD # 1 am session Pt OOB in recliner "very uncomfortable".  Assisted out of recliner to amb in hallway.  General transfer comment: 25% VC's on proper tech to avoid hip fle >90 degrees during sit to stand and stand to sit plus safety with turns.  General Gait Details: one VC on safety with turns to avoid excessive L hip internal rotation.  Returned to room and assist changing pants as pt c/o "too tight" on my hip.  Positioned in recliner and applied ICE.  Pt plans to D/C to home tomorrow, so will see again this afternoon.    Follow Up Recommendations  Follow surgeon's recommendation for DC plan and follow-up therapies;Home health PT     Equipment Recommendations  Rolling walker with 5" wheels    Recommendations for Other Services       Precautions / Restrictions Precautions Precautions: Posterior Hip Precaution Comments: pt able to recall 3/3 THP Restrictions Weight Bearing Restrictions: No RLE Weight Bearing: Weight bearing as tolerated    Mobility  Bed Mobility               General bed mobility comments: OOB in recliner  Transfers Overall transfer level: Needs assistance Equipment used: Rolling walker (2 wheeled) Transfers: Sit to/from Stand Sit to Stand: Supervision;Min guard         General transfer comment: 25% VC's on proper tech to avoid hip fle >90 degrees during sit to stand and stand to sit plus safety with turns  Ambulation/Gait Ambulation/Gait assistance: Supervision;Min guard Gait Distance (Feet): 74 Feet Assistive device: Rolling walker (2 wheeled) Gait Pattern/deviations: Step-to  pattern;Decreased stance time - left;Decreased stride length Gait velocity: decreased   General Gait Details: one VC on safety with turns to avoid excessive L hip internal roation   [employer] Rankin (Stroke Patients Only)       Balance                                            Cognition Arousal/Alertness: Awake/alert Behavior During Therapy: WFL for tasks assessed/performed Overall Cognitive Status: Within Functional Limits for tasks assessed                                        Exercises      General Comments        Pertinent Vitals/Pain Pain Assessment: 0-10 Pain Score: 8  Pain Location: Lt hip Pain Descriptors / Indicators: Aching;Sore Pain Intervention(s): Monitored during session;Repositioned;Patient requesting pain meds-RN notified;Ice applied    Home Living                      Prior Function            PT Goals (current goals can now be found in the care plan section) Progress towards PT goals: Progressing toward goals  Frequency    7X/week      PT Plan Current plan remains appropriate    Co-evaluation              AM-PAC PT "6 Clicks" Mobility   Outcome Measure  Help needed turning from your back to your side while in a flat bed without using bedrails?: A Little Help needed moving from lying on your back to sitting on the side of a flat bed without using bedrails?: A Little Help needed moving to and from a bed to a chair (including a wheelchair)?: A Little Help needed standing up from a chair using your arms (e.g., wheelchair or bedside chair)?: A Little Help needed to walk in hospital room?: A Little Help needed climbing 3-5 steps with a railing? : A Little 6 Click Score: 18    End of Session Equipment Utilized During Treatment: Gait belt Activity Tolerance: Patient tolerated treatment well Patient left: in chair;with call bell/phone  within reach;with chair alarm set;with family/visitor present   PT Visit Diagnosis: Muscle weakness (generalized) (M62.81);Difficulty in walking, not elsewhere classified (R26.2)     Time: [id number] PT Time Calculation (min) (ACUTE ONLY): 27 min  Charges:  $Gait Training: 8-22 mins $Therapeutic Activity: 8-22 mins                     Rica Koyanagi  PTA Acute  Rehabilitation Services Pager      [phone] Office      [phone]

## 2019-05-27 NOTE — Progress Notes (Signed)
Subjective: 1 Day Post-Op s/p Procedure(s): TOTAL HIP ARTHROPLASTY   Patient is alert, oriented, sitting up in chair. Patient reports pain to be mild in hip, but has long history of back pain for which he takes narcotic pain medication. Patient slept in chair as he found bed uncomfortable for back.  Denies chest pain, SOB, Calf pain. No nausea/vomiting. Has not yet passed bowel movement.    Objective:  PE: VITALS:   Vitals:   05/26/19 1619 05/26/19 2025 05/27/19 0158 05/27/19 0613  BP: 121/90 104/72 109/78 117/64  Pulse: 91 76 78 82  Resp: 16 14 14 14   Temp: 98.2 F (36.8 C) 98.3 F (36.8 C) 98.2 F (36.8 C) 98 F (36.7 C)  TempSrc: Oral Oral Oral Oral  SpO2: 97% 98% 99% 97%  Weight:      Height:        ABD soft Neurovascular intact Sensation intact distally Intact pulses distally Dorsiflexion/Plantar flexion intact Incision: dressing C/D/I  LABS  Results for orders placed or performed during the hospital encounter of 05/26/19 (from the past 24 hour(s))  ABO/Rh     Status: None   Collection Time: 05/26/19  7:25 AM  Result Value Ref Range   ABO/RH(D)      O POS Performed at [facility], [place] [address] [place], [place] [address]   POCT I-Stat EG7     Status: Abnormal   Collection Time: 05/26/19  9:31 AM  Result Value Ref Range   pH, Ven 7.377 7.250 - 7.430   pCO2, Ven 46.4 44.0 - 60.0 mmHg   pO2, Ven 109.0 (H) 32.0 - 45.0 mmHg   Bicarbonate 27.2 20.0 - 28.0 mmol/L   TCO2 29 22 - 32 mmol/L   O2 Saturation 98.0 %   Acid-Base Excess 1.0 0.0 - 2.0 mmol/L   Sodium 136 135 - 145 mmol/L   Potassium 3.7 3.5 - 5.1 mmol/L   Calcium, Ion 1.13 (L) 1.15 - 1.40 mmol/L   HCT 52.0 39.0 - 52.0 %   Hemoglobin 17.7 (H) 13.0 - 17.0 g/dL   Patient temperature HIDE    Sample type VENOUS   Prepare RBC     Status: None   Collection Time: 05/26/19 10:22 AM  Result Value Ref Range   Order Confirmation      ORDER PROCESSED BY BLOOD BANK Performed at  [facility], [place] [address], [place], [place] [address]   Glucose, capillary     Status: Abnormal   Collection Time: 05/26/19 11:07 AM  Result Value Ref Range   Glucose-Capillary 157 (H) 70 - 99 mg/dL  Glucose, capillary     Status: Abnormal   Collection Time: 05/26/19  4:55 PM  Result Value Ref Range   Glucose-Capillary 154 (H) 70 - 99 mg/dL  Glucose, capillary     Status: Abnormal   Collection Time: 05/26/19  8:41 PM  Result Value Ref Range   Glucose-Capillary 161 (H) 70 - 99 mg/dL  CBC     Status: None   Collection Time: 05/27/19  3:02 AM  Result Value Ref Range   WBC 9.6 4.0 - 10.5 K/uL   RBC 4.78 4.22 - 5.81 MIL/uL   Hemoglobin 15.0 13.0 - 17.0 g/dL   HCT 47.8 39.0 - 52.0 %   MCV 100.0 80.0 - 100.0 fL   MCH 31.4 26.0 - 34.0 pg   MCHC 31.4 30.0 - 36.0 g/dL   RDW 14.4 11.5 - 15.5 %   Platelets  162 150 - 400 K/uL   nRBC 0.0 0.0 - 0.2 %  Basic metabolic panel     Status: Abnormal   Collection Time: 05/27/19  3:02 AM  Result Value Ref Range   Sodium 135 135 - 145 mmol/L   Potassium 3.8 3.5 - 5.1 mmol/L   Chloride 100 98 - 111 mmol/L   CO2 25 22 - 32 mmol/L   Glucose, Bld 139 (H) 70 - 99 mg/dL   BUN 23 8 - 23 mg/dL   Creatinine, Ser 1.15 0.61 - 1.24 mg/dL   Calcium 8.3 (L) 8.9 - 10.3 mg/dL   GFR calc non Af Amer >60 >60 mL/min   GFR calc Af Amer >60 >60 mL/min   Anion gap 10 5 - 15    DG Hip Port Unilat With Pelvis 1V Left  Result Date: 05/26/2019 CLINICAL DATA:  Left hip arthroplasty EXAM: DG HIP (WITH OR WITHOUT PELVIS) 1V PORT LEFT COMPARISON:  02/10/2019 FINDINGS: Interval postsurgical changes from left total hip arthroplasty. Arthroplasty components are in their expected alignment without dislocation. Distal tip of the femoral stem is slightly eccentrically located within the medullary space. No periprosthetic fracture. Expected postoperative changes within the soft tissues overlying the left hip. Partially visualized right total hip arthroplasty  hardware with prominent heterotopic bone formation, similar to prior. IMPRESSION: Interval postsurgical changes from left total hip arthroplasty without evidence of dislocation or periprosthetic fracture. Electronically Signed   By: Davina Poke D.O.   On: 05/26/2019 11:54    Assessment/Plan: Principal Problem:   Osteoarthritis of left hip Active Problems:   S/P total hip arthroplasty    1 Day Post-Op s/p Procedure(s): TOTAL HIP ARTHROPLASTY  Weightbearing: WBAT LLE Insicional and dressing care: Reinforce dressings as needed. New dressing placed yesterday evening, no drainage seen on exam today. Re-taped for reinforcement.  Orthopedic device(s): None VTE prophylaxis: Home Eliquis restarted Pain control: continue current pain control regimen Follow - up plan: Follow up with Dr. Mardelle Matte in 2 weeks  Face to Face order for HHPT placed, patient also requesting a raised toilet to bring home. Plan to keep patient tonight for more work with therapy today and tomorrow with possible discharge tomorrow.  Contact information:   Weekdays 8-5 Merlene [address]ing, [place] [phone] A fter hours and holidays please check [email or web address] for group call information for [facility]  Ventura Bruns 05/27/2019, 7:21 AM

## 2019-05-27 NOTE — Anesthesia Postprocedure Evaluation (Signed)
Anesthesia Post Note  Patient: Walter [name]  Procedure(s) Performed: TOTAL HIP ARTHROPLASTY (Left Hip)     Patient location during evaluation: PACU Anesthesia Type: General Level of consciousness: awake Pain management: pain level controlled Vital Signs Assessment: post-procedure vital signs reviewed and stable Respiratory status: spontaneous breathing Cardiovascular status: stable Postop Assessment: no apparent nausea or vomiting Anesthetic complications: no    Last Vitals:  Vitals:   05/27/19 1500 05/27/19 1956  BP: 95/60 101/68  Pulse: 85 93  Resp: 16 19  Temp: 37.2 C 37.1 C  SpO2: 96% 95%    Last Pain:  Vitals:   05/27/19 1956  TempSrc: Oral  PainSc:                  Lailanie Hasley

## 2019-05-27 NOTE — Plan of Care (Signed)

## 2019-05-27 NOTE — Progress Notes (Signed)
Physical Therapy Treatment Patient Details Name: JAYNE GERRITS MRN: [id number] DOB: Jul 20, 1945 Today's Date: 05/27/2019    History of Present Illness Patient is 74 y.o. male s/p Lt THA posterior approach on 05/26/19 with PMH significant for DM, GERD, CAD, A-fib, anxiety, testicular cancer, MI, cardiomyopathy, Rt RCR, pacemaker, Rt THA.    PT Comments    POD # 1 pm session. General Gait Details: decreased amb distance due to increased c/o "spasms" and "tired". General transfer comment: 25% VC's on proper tech to avoid hip fle >90 degrees during sit to stand and stand to sit plus safety with turns.  General bed mobility comments: assisted back to bed per pt request.  Demonstarted and instructed how to use a belt to self assist LE up onto bed.  Positioned to comfort.  Applied ICE.   Follow Up Recommendations  Follow surgeon's recommendation for DC plan and follow-up therapies;Home health PT     Equipment Recommendations  Rolling walker with 5" wheels    Recommendations for Other Services       Precautions / Restrictions Precautions Precautions: Posterior Hip Precaution Comments: pt able to recall 3/3 THP Restrictions Weight Bearing Restrictions: No RLE Weight Bearing: Weight bearing as tolerated    Mobility  Bed Mobility Overal bed mobility: Needs Assistance Bed Mobility: Sit to Supine       Sit to supine: Mod assist;Max assist   General bed mobility comments: assisted back to bed per pt request.  Demonstarted and instructed how to use a belt to self assist LE up onto bed.  Transfers Overall transfer level: Needs assistance Equipment used: Rolling walker (2 wheeled) Transfers: Sit to/from Stand Sit to Stand: Supervision;Min guard         General transfer comment: 25% VC's on proper tech to avoid hip fle >90 degrees during sit to stand and stand to sit plus safety with turns  Ambulation/Gait Ambulation/Gait assistance: Supervision;Min guard Gait Distance (Feet): 25  Feet Assistive device: Rolling walker (2 wheeled) Gait Pattern/deviations: Step-to pattern;Decreased stance time - left;Decreased stride length Gait velocity: decreased   General Gait Details: decreased amb distance due to increased c/o "spasms" and "tired".   Stairs             Wheelchair Mobility    Modified Rankin (Stroke Patients Only)       Balance                                            Cognition Arousal/Alertness: Awake/alert Behavior During Therapy: WFL for tasks assessed/performed Overall Cognitive Status: Within Functional Limits for tasks assessed                                        Exercises      General Comments        Pertinent Vitals/Pain Pain Assessment: 0-10 Pain Score: 8  Pain Location: Lt hip Pain Descriptors / Indicators: Aching;Sore Pain Intervention(s): Monitored during session;Repositioned;Patient requesting pain meds-RN notified;Ice applied    Home Living                      Prior Function            PT Goals (current goals can now be found in the care plan section) Progress towards PT  goals: Progressing toward goals    Frequency    7X/week      PT Plan Current plan remains appropriate    Co-evaluation              AM-PAC PT "6 Clicks" Mobility   Outcome Measure  Help needed turning from your back to your side while in a flat bed without using bedrails?: A Little Help needed moving from lying on your back to sitting on the side of a flat bed without using bedrails?: A Little Help needed moving to and from a bed to a chair (including a wheelchair)?: A Little Help needed standing up from a chair using your arms (e.g., wheelchair or bedside chair)?: A Little Help needed to walk in hospital room?: A Little Help needed climbing 3-5 steps with a railing? : A Little 6 Click Score: 18    End of Session Equipment Utilized During Treatment: Gait belt Activity  Tolerance: Patient tolerated treatment well Patient left: in bed   PT Visit Diagnosis: Muscle weakness (generalized) (M62.81);Difficulty in walking, not elsewhere classified (R26.2)     Time: [id number] PT Time Calculation (min) (ACUTE ONLY): 24 min  Charges:  $Gait Training: 8-22 mins $Therapeutic Activity: 8-22 mins                     Rica Koyanagi  PTA Acute  Rehabilitation Services Pager      [phone] Office      [phone]

## 2019-05-28 LAB — CBC
HCT: 46.6 % (ref 39.0–52.0)
Hemoglobin: 14.5 g/dL (ref 13.0–17.0)
MCH: 31.6 pg (ref 26.0–34.0)
MCHC: 31.1 g/dL (ref 30.0–36.0)
MCV: 101.5 fL — ABNORMAL HIGH (ref 80.0–100.0)
Platelets: 159 10*3/uL (ref 150–400)
RBC: 4.59 MIL/uL (ref 4.22–5.81)
RDW: 14.5 % (ref 11.5–15.5)
WBC: 9.9 10*3/uL (ref 4.0–10.5)
nRBC: 0 % (ref 0.0–0.2)

## 2019-05-28 LAB — GLUCOSE, CAPILLARY
Glucose-Capillary: 118 mg/dL — ABNORMAL HIGH (ref 70–99)
Glucose-Capillary: 167 mg/dL — ABNORMAL HIGH (ref 70–99)

## 2019-05-28 MED ORDER — HYDROMORPHONE HCL 2 MG PO TABS: 2.0000 mg | ORAL_TABLET | ORAL | 0 refills | Status: AC | PRN

## 2019-05-28 NOTE — Progress Notes (Signed)
Physical Therapy Treatment Patient Details Name: Walter [name] MRN: [id number] DOB: 14-Nov-1945 Today's Date: 05/28/2019    History of Present Illness Patient is 74 y.o. male s/p Lt THA posterior approach on 05/26/19 with PMH significant for DM, GERD, CAD, A-fib, anxiety, testicular cancer, MI, cardiomyopathy, Rt RCR, pacemaker, Rt THA.    PT Comments    POD # 2 am session Pt dressed and in recliner.  Assisted with amb.  General Gait Details: increased distance but also increased time with increased pain and difficulty moving L LE.  General transfer comment: <25% VC's on proper tech to avoid hip fle >90 degrees during sit to stand and stand to sit plus safety with turns.  Then returned to room to perform some TE's following HEP handout.  Instructed on proper tech, freq as well as use of ICE.   Addressed all mobility questions, discussed appropriate activity, educated on use of ICE.  Pt ready for D/C to home.   Follow Up Recommendations  Follow surgeon's recommendation for DC plan and follow-up therapies;Home health PT     Equipment Recommendations  Rolling walker with 5" wheels    Recommendations for Other Services       Precautions / Restrictions Precautions Precautions: Posterior Hip Precaution Booklet Issued: Yes (comment) Precaution Comments: pt able to recall 3/3 THP Restrictions Weight Bearing Restrictions: No RLE Weight Bearing: Weight bearing as tolerated    Mobility  Bed Mobility               General bed mobility comments: OOB in recliner  Transfers Overall transfer level: Needs assistance Equipment used: Rolling walker (2 wheeled) Transfers: Sit to/from Stand Sit to Stand: Supervision;Min guard         General transfer comment: <25% VC's on proper tech to avoid hip fle >90 degrees during sit to stand and stand to sit plus safety with turns  Ambulation/Gait Ambulation/Gait assistance: Supervision Gait Distance (Feet): 38 Feet Assistive device:  Rolling walker (2 wheeled) Gait Pattern/deviations: Step-to pattern;Decreased stance time - left;Decreased stride length Gait velocity: decreased   General Gait Details: increased distance but also increased time with increased pain and difficulty moving L LE   Stairs             Wheelchair Mobility    Modified Rankin (Stroke Patients Only)       Balance                                            Cognition Arousal/Alertness: Awake/alert Behavior During Therapy: WFL for tasks assessed/performed Overall Cognitive Status: Within Functional Limits for tasks assessed                                        Exercises      General Comments        Pertinent Vitals/Pain Pain Assessment: 0-10 Pain Score: 6  Pain Location: Lt hip Pain Descriptors / Indicators: Aching;Sore;Operative site guarding;Tender;Tightness Pain Intervention(s): Monitored during session;Repositioned;Patient requesting pain meds-RN notified;Ice applied    Home Living                      Prior Function            PT Goals (current goals can now be found in the care  plan section) Progress towards PT goals: Progressing toward goals    Frequency    7X/week      PT Plan Current plan remains appropriate    Co-evaluation              AM-PAC PT "6 Clicks" Mobility   Outcome Measure  Help needed turning from your back to your side while in a flat bed without using bedrails?: None Help needed moving from lying on your back to sitting on the side of a flat bed without using bedrails?: None Help needed moving to and from a bed to a chair (including a wheelchair)?: None Help needed standing up from a chair using your arms (e.g., wheelchair or bedside chair)?: None Help needed to walk in hospital room?: None Help needed climbing 3-5 steps with a railing? : A Little 6 Click Score: 23    End of Session Equipment Utilized During Treatment: Gait  belt Activity Tolerance: Patient tolerated treatment well Patient left: in chair Nurse Communication: Mobility status(pt ready for D/C to home one session) PT Visit Diagnosis: Muscle weakness (generalized) (M62.81);Difficulty in walking, not elsewhere classified (R26.2)     Time: [id number] PT Time Calculation (min) (ACUTE ONLY): 30 min  Charges:  $Gait Training: 8-22 mins $Therapeutic Exercise: 8-22 mins                     {Geanette Buonocore  PTA Acute  Rehabilitation Services Pager      [phone] Office      [phone]

## 2019-05-28 NOTE — Progress Notes (Signed)
Patient discharged to home w/ wife. Given all belongings, instructions, equipment. Patient and wife verbalized understanding of all instructions. Escorted to pov via w/c.

## 2019-05-28 NOTE — Progress Notes (Addendum)
Subjective: 2 Days Post-Op s/p Procedure(s): TOTAL HIP ARTHROPLASTY   Patient is alert, oriented, laying comforably in chair. Patient reports pain to be moderate this morning and worse when working with PT.  Denies chest pain, SOB, Calf pain. No nausea/vomiting. No other complaints. Patient feels he is ready to go home this afternoon.  Objective:  PE: VITALS:   Vitals:   05/27/19 1036 05/27/19 1500 05/27/19 1956 05/28/19 0538  BP: 114/66 95/60 101/68 116/70  Pulse: 84 85 93 81  Resp: 16 16 19 19   Temp: 99.1 F (37.3 C) 98.9 F (37.2 C) 98.7 F (37.1 C) 98.1 F (36.7 C)  TempSrc: Oral Oral Oral Oral  SpO2: 93% 96% 95% 96%  Weight:      Height:        ABD soft Neurovascular intact Sensation intact distally Intact pulses distally Dorsiflexion/Plantar flexion intact Incision: dressing C/D/I  LABS  Results for orders placed or performed during the hospital encounter of 05/26/19 (from the past 24 hour(s))  Glucose, capillary     Status: Abnormal   Collection Time: 05/27/19  7:34 AM  Result Value Ref Range   Glucose-Capillary 134 (H) 70 - 99 mg/dL  Glucose, capillary     Status: Abnormal   Collection Time: 05/27/19 12:20 PM  Result Value Ref Range   Glucose-Capillary 105 (H) 70 - 99 mg/dL  Glucose, capillary     Status: Abnormal   Collection Time: 05/27/19  4:57 PM  Result Value Ref Range   Glucose-Capillary 233 (H) 70 - 99 mg/dL  Glucose, capillary     Status: Abnormal   Collection Time: 05/27/19  9:51 PM  Result Value Ref Range   Glucose-Capillary 118 (H) 70 - 99 mg/dL  CBC     Status: Abnormal   Collection Time: 05/28/19  2:53 AM  Result Value Ref Range   WBC 9.9 4.0 - 10.5 K/uL   RBC 4.59 4.22 - 5.81 MIL/uL   Hemoglobin 14.5 13.0 - 17.0 g/dL   HCT 46.6 39.0 - 52.0 %   MCV 101.5 (H) 80.0 - 100.0 fL   MCH 31.6 26.0 - 34.0 pg   MCHC 31.1 30.0 - 36.0 g/dL   RDW 14.5 11.5 - 15.5 %   Platelets 159 150 - 400 K/uL   nRBC 0.0 0.0 - 0.2 %    DG Hip Port  Unilat With Pelvis 1V Left  Result Date: 05/26/2019 CLINICAL DATA:  Left hip arthroplasty EXAM: DG HIP (WITH OR WITHOUT PELVIS) 1V PORT LEFT COMPARISON:  02/10/2019 FINDINGS: Interval postsurgical changes from left total hip arthroplasty. Arthroplasty components are in their expected alignment without dislocation. Distal tip of the femoral stem is slightly eccentrically located within the medullary space. No periprosthetic fracture. Expected postoperative changes within the soft tissues overlying the left hip. Partially visualized right total hip arthroplasty hardware with prominent heterotopic bone formation, similar to prior. IMPRESSION: Interval postsurgical changes from left total hip arthroplasty without evidence of dislocation or periprosthetic fracture. Electronically Signed   By: Davina Poke D.O.   On: 05/26/2019 11:54    Assessment/Plan: Principal Problem:   Osteoarthritis of left hip Active Problems:   S/P total hip arthroplasty    2 Days Post-Op s/p Procedure(s): TOTAL HIP ARTHROPLASTY  Weightbearing: WBAT LLE Insicional and dressing care: Reinforce dressings as needed. New dressing placed this morning. Orthopedic device(s): None VTE prophylaxis: Home Eliquis restarted Pain control: continue current pain control regimen. Follow - up plan: Follow up with Dr. Mardelle Matte in 2  weeks  Patient set up with [facility] for [place]. Will plan to send in prescription for raised toilet. Plan to discharge this afternoon after therapy visit.   Contact information:   Weekdays 8-5 Merlene Pulling, [place] [phone] A fter hours and holidays please check [email or web address] for group call information for [facility]  Ventura Bruns 05/28/2019, 6:26 AM

## 2019-05-28 NOTE — Discharge Summary (Signed)
Discharge Summary  Patient ID: Walter [name] MRN: [id number] DOB/AGE: 1945/08/29 74 y.o.  Admit date: 05/26/2019 Discharge date: 05/28/2019  Admission Diagnoses:  Osteoarthritis of left hip  Discharge Diagnoses:  Principal Problem:   Osteoarthritis of left hip Active Problems:   S/P total hip arthroplasty   Past Medical History:  Diagnosis Date  . AICD (automatic cardioverter/defibrillator) present   . Anxiety   . Asbestosis ([place])    mild  . Atrial fibrillation ([place])   . Chronic low back pain    /sciatica- Dr Letta Median [facility])  . Complication of anesthesia 1996   previous surgery had a run of v-tach (for gallbladder-1990's)-cannot lie completely flat and panis when oxygen mask placed over face  . Coronary artery disease, non-occlusive June 2011   Cardiac cath in [facility] following abnormal Myoview  . Delirium 05/06/2019  . DJD (degenerative joint disease)    /osteoarthritis  . Dyslipidemia, goal LDL below 100    On lovastatin (myalgias with Zocor and Lipitor)  . Dyspnea    worse in hot weather  . Dysrhythmia    LBBB, in 2015-atrial fibrillation  . Essential hypertension   . Factor V Leiden [facility])    sees Dr. Cherrie Gauze Marrow- no problems with this  . Frozen shoulder    right  . GERD (gastroesophageal reflux disease)   . H/O asbestosis    when in [employer]-1964-1970,1971-1986 in [employer]-diagnosed in 1990  . History of kidney stones   . Hypogonadism male   . Irritable bowel   . Kidney stone   . Left bundle branch block (LBBB) on electrocardiogram    Diagnosed close to 20 years ago  . Myocardial infarction ([place])   . Non-ischemic cardiomyopathy [facility]) June 2011   EF 45-50%; suspect related to LBBB; repeat Echo March 2015: EF 50- 55%  . Obesity   . PONV (postoperative nausea and vomiting)   . Restless leg syndrome   . Rotator cuff arthropathy of right shoulder 05/14/2017  . Testicular cancer ([place]) 1972   left  . Tremor, essential 12/09/2015  .  Type 2 diabetes mellitus without complication ([place]) [id number]   Type 2.     Surgeries: Procedure(s): TOTAL HIP ARTHROPLASTY on 05/26/2019   Consultants (if any):   Discharged Condition: Improved  Hospital Course: Walter [name] is an 74 y.o. male who was admitted 05/26/2019 with a diagnosis of Osteoarthritis of left hip and went to the operating room on 05/26/2019 and underwent the above named procedures.    He was given perioperative antibiotics:  Anti-infectives (From admission, onward)   Start     Dose/Rate Route Frequency Ordered Stop   05/26/19 1400  ceFAZolin (ANCEF) IVPB 2g/100 mL premix     2 g 200 mL/hr over 30 Minutes Intravenous Every 6 hours 05/26/19 1246 05/26/19 2103   05/26/19 0600  ceFAZolin (ANCEF) IVPB 2g/100 mL premix     2 g 200 mL/hr over 30 Minutes Intravenous On call to O.R. 05/26/19 [id number] 05/26/19 0741    .  He was given sequential compression devices, early ambulation, and continued Eliquis for DVT prophylaxis.  He benefited maximally from the hospital stay and there were no complications.    Recent vital signs:  Vitals:   05/27/19 1956 05/28/19 0538  BP: 101/68 116/70  Pulse: 93 81  Resp: 19 19  Temp: 98.7 F (37.1 C) 98.1 F (36.7 C)  SpO2: 95% 96%    Recent laboratory studies:  Lab Results  Component Value Date  HGB 14.5 05/28/2019   HGB 15.0 05/27/2019   HGB 17.7 (H) 05/26/2019   Lab Results  Component Value Date   WBC 9.9 05/28/2019   PLT 159 05/28/2019   Lab Results  Component Value Date   INR 1.04 05/14/2017   Lab Results  Component Value Date   NA 135 05/27/2019   K 3.8 05/27/2019   CL 100 05/27/2019   CO2 25 05/27/2019   BUN 23 05/27/2019   CREATININE 1.15 05/27/2019   GLUCOSE 139 (H) 05/27/2019    Discharge Medications:   Allergies as of 05/28/2019      Reactions   Statins Other (See Comments)   Myalgias.  Can only take Altoprev   Cortisone Other (See Comments)   Received Cortisone injection in left hip on Friday  before Thanksgiving ,2020 and the next day, does not remember 3 days as he zoned out. Physician informed him to let Anesthesia know this.      Medication List    STOP taking these medications   oxyCODONE-acetaminophen 10-325 MG tablet Commonly known as: PERCOCET     TAKE these medications   albuterol 108 (90 Base) MCG/ACT inhaler Commonly known as: VENTOLIN HFA Inhale 1-2 puffs into the lungs every 6 (six) hours as needed for wheezing or shortness of breath.   amiodarone 200 MG tablet Commonly known as: PACERONE TAKE ONE-HALF (1/2) TABLET DAILY What changed:   when to take this  additional instructions   apixaban 5 MG Tabs tablet Commonly known as: ELIQUIS Take 5 mg by mouth 2 (two) times daily.   baclofen 10 MG tablet Commonly known as: LIORESAL Take 1 tablet (10 mg total) by mouth 3 (three) times daily. As needed for muscle spasm   carvedilol 12.5 MG tablet Commonly known as: COREG TAKE 1 TABLET TWICE A DAY What changed: when to take this   cholecalciferol 25 MCG (1000 UNIT) tablet Commonly known as: VITAMIN D3 Take 1,000 Units by mouth daily.   Depo-Testosterone 200 MG/ML injection Generic drug: testosterone cypionate Inject 200 mg into the muscle See admin instructions. Inject 200 mg into the muscle every 10 days   esomeprazole 40 MG capsule Commonly known as: NEXIUM Take 40 mg by mouth daily after breakfast.   furosemide 20 MG tablet Commonly known as: LASIX Take 1 tablet (20 mg total) by mouth every other day.   gabapentin 300 MG capsule Commonly known as: NEURONTIN Take 600 mg by mouth daily as needed (pain).   HYDROmorphone 2 MG tablet Commonly known as: Dilaudid Take 1 tablet (2 mg total) by mouth every 4 (four) hours as needed for up to 7 days for severe pain.   Jardiance 10 MG Tabs tablet Generic drug: empagliflozin Take 25 mg by mouth daily.   lisinopril 10 MG tablet Commonly known as: ZESTRIL Take 10 mg by mouth daily.   metFORMIN 500  MG tablet Commonly known as: GLUCOPHAGE Take 1,000 mg by mouth 2 (two) times daily with a meal.   omega-3 acid ethyl esters 1 g capsule Commonly known as: LOVAZA Take 1 g by mouth 2 (two) times daily.   rOPINIRole 0.5 MG tablet Commonly known as: REQUIP Take 0.5 mg by mouth daily as needed (restless leg).   rOPINIRole 1 MG tablet Commonly known as: REQUIP Take 1 mg by mouth at bedtime.   sennosides-docusate sodium 8.6-50 MG tablet Commonly known as: SENOKOT-S Take 2 tablets by mouth daily.   sildenafil 100 MG tablet Commonly known as: VIAGRA Take 100 mg by  mouth daily as needed for erectile dysfunction.   spironolactone 25 MG tablet Commonly known as: ALDACTONE TAKE 1 TABLET DAILY IN THE MORNING What changed:   how much to take  how to take this  when to take this   tiZANidine 4 MG capsule Commonly known as: ZANAFLEX Take 4 mg by mouth 2 (two) times daily as needed for muscle spasms.       Diagnostic Studies: CUP PACEART REMOTE DEVICE CHECK  Result Date: 05/18/2019 Scheduled remote reviewed.  Normal device function.  2 AMS episodes; each <10 seconds. Next remote 91 days.    [place]  DG Hip Port Unilat With Pelvis 1V Left  Result Date: 05/26/2019 CLINICAL DATA:  Left hip arthroplasty EXAM: DG HIP (WITH OR WITHOUT PELVIS) 1V PORT LEFT COMPARISON:  02/10/2019 FINDINGS: Interval postsurgical changes from left total hip arthroplasty. Arthroplasty components are in their expected alignment without dislocation. Distal tip of the femoral stem is slightly eccentrically located within the medullary space. No periprosthetic fracture. Expected postoperative changes within the soft tissues overlying the left hip. Partially visualized right total hip arthroplasty hardware with prominent heterotopic bone formation, similar to prior. IMPRESSION: Interval postsurgical changes from left total hip arthroplasty without evidence of dislocation or periprosthetic fracture. Electronically  Signed   By: Davina Poke D.O.   On: 05/26/2019 11:54    Disposition: Discharge disposition: 01-Home or Self Care         Follow-up Information    Marchia Bond, MD. Schedule an appointment as soon as possible for a visit in 2 weeks.   Specialty: Orthopedic Surgery Contact information: [place] [address] [phone]            Signed: Ventura Bruns PA-C 05/28/2019, 8:30 AM

## 2019-05-29 DIAGNOSIS — I4819 Other persistent atrial fibrillation: Secondary | ICD-10-CM | POA: Diagnosis not present

## 2019-05-29 DIAGNOSIS — M1612 Unilateral primary osteoarthritis, left hip: Secondary | ICD-10-CM | POA: Diagnosis not present

## 2019-05-29 DIAGNOSIS — Z7901 Long term (current) use of anticoagulants: Secondary | ICD-10-CM | POA: Diagnosis not present

## 2019-05-29 DIAGNOSIS — E669 Obesity, unspecified: Secondary | ICD-10-CM | POA: Diagnosis not present

## 2019-05-29 DIAGNOSIS — E785 Hyperlipidemia, unspecified: Secondary | ICD-10-CM | POA: Diagnosis not present

## 2019-05-29 DIAGNOSIS — G8929 Other chronic pain: Secondary | ICD-10-CM | POA: Diagnosis not present

## 2019-05-29 DIAGNOSIS — G2581 Restless legs syndrome: Secondary | ICD-10-CM | POA: Diagnosis not present

## 2019-05-29 DIAGNOSIS — E119 Type 2 diabetes mellitus without complications: Secondary | ICD-10-CM | POA: Diagnosis not present

## 2019-05-29 DIAGNOSIS — I2510 Atherosclerotic heart disease of native coronary artery without angina pectoris: Secondary | ICD-10-CM | POA: Diagnosis not present

## 2019-05-29 DIAGNOSIS — F05 Delirium due to known physiological condition: Secondary | ICD-10-CM | POA: Diagnosis not present

## 2019-05-29 DIAGNOSIS — Z87891 Personal history of nicotine dependence: Secondary | ICD-10-CM | POA: Diagnosis not present

## 2019-05-29 DIAGNOSIS — K589 Irritable bowel syndrome without diarrhea: Secondary | ICD-10-CM | POA: Diagnosis not present

## 2019-05-29 DIAGNOSIS — I10 Essential (primary) hypertension: Secondary | ICD-10-CM | POA: Diagnosis not present

## 2019-05-29 DIAGNOSIS — Z471 Aftercare following joint replacement surgery: Secondary | ICD-10-CM | POA: Diagnosis not present

## 2019-05-29 DIAGNOSIS — M19019 Primary osteoarthritis, unspecified shoulder: Secondary | ICD-10-CM | POA: Diagnosis not present

## 2019-05-29 DIAGNOSIS — I428 Other cardiomyopathies: Secondary | ICD-10-CM | POA: Diagnosis not present

## 2019-05-29 DIAGNOSIS — Z96643 Presence of artificial hip joint, bilateral: Secondary | ICD-10-CM | POA: Diagnosis not present

## 2019-05-29 DIAGNOSIS — I447 Left bundle-branch block, unspecified: Secondary | ICD-10-CM | POA: Diagnosis not present

## 2019-05-29 DIAGNOSIS — Z95810 Presence of automatic (implantable) cardiac defibrillator: Secondary | ICD-10-CM | POA: Diagnosis not present

## 2019-05-29 DIAGNOSIS — Z6833 Body mass index (BMI) 33.0-33.9, adult: Secondary | ICD-10-CM | POA: Diagnosis not present

## 2019-05-29 DIAGNOSIS — K219 Gastro-esophageal reflux disease without esophagitis: Secondary | ICD-10-CM | POA: Diagnosis not present

## 2019-05-29 DIAGNOSIS — M545 Low back pain: Secondary | ICD-10-CM | POA: Diagnosis not present

## 2019-05-29 DIAGNOSIS — F419 Anxiety disorder, unspecified: Secondary | ICD-10-CM | POA: Diagnosis not present

## 2019-05-29 DIAGNOSIS — G4700 Insomnia, unspecified: Secondary | ICD-10-CM | POA: Diagnosis not present

## 2019-05-29 DIAGNOSIS — Z7984 Long term (current) use of oral hypoglycemic drugs: Secondary | ICD-10-CM | POA: Diagnosis not present

## 2019-05-30 LAB — TYPE AND SCREEN
ABO/RH(D): O POS
Antibody Screen: NEGATIVE
Unit division: 0

## 2019-05-30 LAB — BPAM RBC
Blood Product Expiration Date: 202104062359
Unit Type and Rh: 5100

## 2019-06-02 NOTE — Progress Notes (Signed)
Pt cancelled appt

## 2019-06-04 ENCOUNTER — Ambulatory Visit (INDEPENDENT_AMBULATORY_CARE_PROVIDER_SITE_OTHER): Payer: Medicare Other | Admitting: Nurse Practitioner

## 2019-06-04 DIAGNOSIS — I480 Paroxysmal atrial fibrillation: Secondary | ICD-10-CM

## 2019-06-04 DIAGNOSIS — I10 Essential (primary) hypertension: Secondary | ICD-10-CM

## 2019-06-04 DIAGNOSIS — I519 Heart disease, unspecified: Secondary | ICD-10-CM

## 2019-06-05 NOTE — Progress Notes (Signed)
No ICM remote transmission received for 06/01/2019 and next ICM transmission scheduled for 06/29/2019.

## 2019-06-08 DIAGNOSIS — M1612 Unilateral primary osteoarthritis, left hip: Secondary | ICD-10-CM | POA: Diagnosis not present

## 2019-06-08 DIAGNOSIS — R262 Difficulty in walking, not elsewhere classified: Secondary | ICD-10-CM | POA: Diagnosis not present

## 2019-06-11 DIAGNOSIS — M1612 Unilateral primary osteoarthritis, left hip: Secondary | ICD-10-CM | POA: Diagnosis not present

## 2019-06-11 DIAGNOSIS — R262 Difficulty in walking, not elsewhere classified: Secondary | ICD-10-CM | POA: Diagnosis not present

## 2019-06-17 DIAGNOSIS — M1612 Unilateral primary osteoarthritis, left hip: Secondary | ICD-10-CM | POA: Diagnosis not present

## 2019-06-17 DIAGNOSIS — R262 Difficulty in walking, not elsewhere classified: Secondary | ICD-10-CM | POA: Diagnosis not present

## 2019-06-19 DIAGNOSIS — M1612 Unilateral primary osteoarthritis, left hip: Secondary | ICD-10-CM | POA: Diagnosis not present

## 2019-06-22 DIAGNOSIS — R262 Difficulty in walking, not elsewhere classified: Secondary | ICD-10-CM | POA: Diagnosis not present

## 2019-06-22 DIAGNOSIS — M1612 Unilateral primary osteoarthritis, left hip: Secondary | ICD-10-CM | POA: Diagnosis not present

## 2019-06-24 DIAGNOSIS — Z79899 Other long term (current) drug therapy: Secondary | ICD-10-CM | POA: Diagnosis not present

## 2019-06-24 DIAGNOSIS — M47817 Spondylosis without myelopathy or radiculopathy, lumbosacral region: Secondary | ICD-10-CM | POA: Diagnosis not present

## 2019-06-24 DIAGNOSIS — G894 Chronic pain syndrome: Secondary | ICD-10-CM | POA: Diagnosis not present

## 2019-06-24 DIAGNOSIS — M1990 Unspecified osteoarthritis, unspecified site: Secondary | ICD-10-CM | POA: Diagnosis not present

## 2019-06-24 DIAGNOSIS — M25559 Pain in unspecified hip: Secondary | ICD-10-CM | POA: Diagnosis not present

## 2019-06-24 DIAGNOSIS — Z79891 Long term (current) use of opiate analgesic: Secondary | ICD-10-CM | POA: Diagnosis not present

## 2019-06-28 DIAGNOSIS — M1612 Unilateral primary osteoarthritis, left hip: Secondary | ICD-10-CM | POA: Diagnosis not present

## 2019-06-30 DIAGNOSIS — M1612 Unilateral primary osteoarthritis, left hip: Secondary | ICD-10-CM | POA: Diagnosis not present

## 2019-06-30 DIAGNOSIS — R262 Difficulty in walking, not elsewhere classified: Secondary | ICD-10-CM | POA: Diagnosis not present

## 2019-07-02 DIAGNOSIS — R262 Difficulty in walking, not elsewhere classified: Secondary | ICD-10-CM | POA: Diagnosis not present

## 2019-07-02 DIAGNOSIS — M1612 Unilateral primary osteoarthritis, left hip: Secondary | ICD-10-CM | POA: Diagnosis not present

## 2019-07-03 ENCOUNTER — Ambulatory Visit (INDEPENDENT_AMBULATORY_CARE_PROVIDER_SITE_OTHER): Payer: Medicare Other | Admitting: Nurse Practitioner

## 2019-07-03 ENCOUNTER — Encounter: Payer: Self-pay | Admitting: Nurse Practitioner

## 2019-07-03 VITALS — BP 120/72 | HR 73 | Ht 72.0 in | Wt 251.0 lb

## 2019-07-03 DIAGNOSIS — I519 Heart disease, unspecified: Secondary | ICD-10-CM

## 2019-07-03 DIAGNOSIS — I10 Essential (primary) hypertension: Secondary | ICD-10-CM | POA: Diagnosis not present

## 2019-07-03 DIAGNOSIS — Z79899 Other long term (current) drug therapy: Secondary | ICD-10-CM | POA: Diagnosis not present

## 2019-07-03 DIAGNOSIS — I480 Paroxysmal atrial fibrillation: Secondary | ICD-10-CM

## 2019-07-03 LAB — CUP PACEART INCLINIC DEVICE CHECK
Battery Remaining Longevity: 24 mo
Brady Statistic RA Percent Paced: 4 %
Brady Statistic RV Percent Paced: 98 %
Date Time Interrogation Session: 20210416110226
HighPow Impedance: 72 Ohm
Implantable Lead Implant Date: 20151119
Implantable Lead Implant Date: 20151119
Implantable Lead Implant Date: 20151119
Implantable Lead Location: 753858
Implantable Lead Location: 753859
Implantable Lead Location: 753860
Implantable Pulse Generator Implant Date: 20151119
Lead Channel Impedance Value: 350 Ohm
Lead Channel Impedance Value: 387.5 Ohm
Lead Channel Impedance Value: 512.5 Ohm
Lead Channel Pacing Threshold Amplitude: 0.75 V
Lead Channel Pacing Threshold Amplitude: 0.75 V
Lead Channel Pacing Threshold Amplitude: 0.75 V
Lead Channel Pacing Threshold Amplitude: 0.75 V
Lead Channel Pacing Threshold Amplitude: 0.75 V
Lead Channel Pacing Threshold Amplitude: 0.75 V
Lead Channel Pacing Threshold Pulse Width: 0.5 ms
Lead Channel Pacing Threshold Pulse Width: 0.5 ms
Lead Channel Pacing Threshold Pulse Width: 0.5 ms
Lead Channel Pacing Threshold Pulse Width: 0.5 ms
Lead Channel Pacing Threshold Pulse Width: 0.5 ms
Lead Channel Pacing Threshold Pulse Width: 0.5 ms
Lead Channel Sensing Intrinsic Amplitude: 1.9 mV
Lead Channel Sensing Intrinsic Amplitude: 12 mV
Lead Channel Setting Pacing Amplitude: 1.5 V
Lead Channel Setting Pacing Amplitude: 2 V
Lead Channel Setting Pacing Amplitude: 2.5 V
Lead Channel Setting Pacing Pulse Width: 0.5 ms
Lead Channel Setting Pacing Pulse Width: 0.5 ms
Lead Channel Setting Sensing Sensitivity: 0.5 mV
Pulse Gen Serial Number: 7211486

## 2019-07-03 NOTE — Progress Notes (Signed)
Electrophysiology Office Note Date: 07/03/2019  ID:  Walter [name], DOB Aug 10, 1945, MRN [id number]  PCP: London Pepper, MD Primary Cardiologist: Aundra Dubin Electrophysiologist: Allred  CC: Routine ICD follow-up  Walter [name] is a 74 y.o. male seen today for [name].  He presents today for routine electrophysiology followup.  Since last being seen in our clinic, the patient reports doing relatively well.  He underwent hip replacement surgery last month and since then has had increased LE edema. He has been taking Lasix 20mg  daily and sometimes 40mg  but has persistent edema.  He denies chest pain, palpitations, dyspnea, PND, orthopnea, nausea, vomiting, dizziness, syncope, or early satiety.  He has not had ICD shocks.   Device History: [id number] NICM, CHF History of appropriate therapy:No History of AAD therapy:Yes - amiodarone for atrial fibrillation  Past Medical History:  Diagnosis Date  . AICD (automatic cardioverter/defibrillator) present   . Anxiety   . Asbestosis ([place])    mild  . Atrial fibrillation ([place])   . Chronic low back pain    /sciatica- Dr Letta Median [facility])  . Complication of anesthesia 1996   previous surgery had a run of v-tach (for gallbladder-1990's)-cannot lie completely flat and panis when oxygen mask placed over face  . Coronary artery disease, non-occlusive June 2011   Cardiac cath in [facility] following abnormal Myoview  . Delirium 05/06/2019  . DJD (degenerative joint disease)    /osteoarthritis  . Dyslipidemia, goal LDL below 100    On lovastatin (myalgias with Zocor and Lipitor)  . Dyspnea    worse in hot weather  . Dysrhythmia    LBBB, in 2015-atrial fibrillation  . Essential hypertension   . Factor V Leiden [facility])    sees Dr. Cherrie Gauze Marrow- no problems with this  . Frozen shoulder    right  . GERD (gastroesophageal reflux disease)   . H/O asbestosis    when in [employer]-1964-1970,1971-1986 in [employer]-diagnosed in 1990  . History of kidney stones   . Hypogonadism male   . Irritable bowel   . Kidney stone   . Left bundle branch block (LBBB) on electrocardiogram    Diagnosed close to 20 years ago  . Myocardial infarction ([place])   . Non-ischemic cardiomyopathy [facility]) June 2011   EF 45-50%; suspect related to LBBB; repeat Echo March 2015: EF 50- 55%  . Obesity   . PONV (postoperative nausea and vomiting)   . Restless leg syndrome   . Rotator cuff arthropathy of right shoulder 05/14/2017  . Testicular cancer ([place]) 1972   left  . Tremor, essential 12/09/2015  . Type 2 diabetes mellitus without complication ([place]) Q000111Q   Type 2.    Past Surgical History:  Procedure Laterality Date  . APPENDECTOMY N/A   . BI-VENTRICULAR IMPLANTABLE CARDIOVERTER DEFIBRILLATOR N/A 02/04/2014   SJM Quadra Assura BiV ICD implanted for primary prevention by [name]  . CARDIAC CATHETERIZATION  June 2011   Nonobstructive CAD  . CARDIAC DEFIBRILLATOR PLACEMENT  02/04/14   [facility] model [place] 3365-40Q (serial Number [id number]) biventricular ICD.  Marland Kitchen CARDIOVERSION N/A 10/13/2013   Procedure: CARDIOVERSION;  Surgeon: Larey Dresser, MD;  Location: [place];  Service: Cardiovascular;  Laterality: N/A;  . CARDIOVERSION  10-16-2013   DCCV by [name] following intracardiac echo to rule out LAA thrombus  . CARDIOVERSION N/A 10/16/2013   Procedure: CARDIOVERSION;  Surgeon: Coralyn Mark, MD;  Location[facility] CATH LAB;  Service: Cardiovascular;  Laterality: N/A;  . CHOLECYSTECTOMY OPEN    . ELECTROPHYSIOLOGY STUDY N/A 10/16/2013   Procedure: ELECTROPHYSIOLOGY STUDY;  Surgeon: Coralyn Mark, MD;  Location: [facility] CATH LAB;  Service: Cardiovascular;  Laterality: N/A;  . ESOPHAGOGASTRODUODENOSCOPY N/A 10/15/2013   Procedure: ESOPHAGOGASTRODUODENOSCOPY (EGD);  Surgeon: Gatha Mayer, MD;  Location: [facility] ENDOSCOPY;  Service: Endoscopy;  Laterality: N/A;  bedside  . HERNIA REPAIR    . KNEE ARTHROSCOPY Left     . LEFT AND RIGHT HEART CATHETERIZATION WITH CORONARY ANGIOGRAM N/A 10/12/2013   Procedure: LEFT AND RIGHT HEART CATHETERIZATION WITH CORONARY ANGIOGRAM;  Surgeon: Troy Sine, MD;  Location: [facility] CATH LAB;  Service: Cardiovascular;  Laterality: N/A;  . lymph nodes    . RADIOFREQUENCY ABLATION NERVES     Back every 6 months  . REVERSE SHOULDER ARTHROPLASTY Right 05/14/2017   Procedure: TOTAL REVERSE SHOULDER ARTHROPLASTY;  Surgeon: Marchia Bond, MD;  Location: [place];  Service: Orthopedics;  Laterality: Right;  . ROTATOR CUFF REPAIR Right   . SURGERY SCROTAL / TESTICULAR Left   . TONSILLECTOMY Bilateral   . TOTAL HIP ARTHROPLASTY Right 02/22/2015   Procedure: RIGHT TOTAL HIP ARTHROPLASTY ANTERIOR APPROACH;  Surgeon: Renette Butters, MD;  Location: [place];  Service: Orthopedics;  Laterality: Right;  . TOTAL HIP ARTHROPLASTY Left 05/26/2019   Procedure: TOTAL HIP ARTHROPLASTY;  Surgeon: Marchia Bond, MD;  Location: [facility];  Service: Orthopedics;  Laterality: Left;  . TRANSTHORACIC ECHOCARDIOGRAM  May 2011   EF 45% with diffuse hypokinesis; septal bounce from LBBB  . TRANSTHORACIC ECHOCARDIOGRAM  March 2015   EF 50-55%. Mild concentric LVH. Septal dyssynergy from LBBB     Current Outpatient Medications  Medication Sig Dispense Refill  . albuterol (PROVENTIL HFA;VENTOLIN HFA) 108 (90 Base) MCG/ACT inhaler Inhale 1-2 puffs into the lungs every 6 (six) hours as needed for wheezing or shortness of breath.    Marland Kitchen amiodarone (PACERONE) 200 MG tablet TAKE ONE-HALF (1/2) TABLET DAILY (Patient taking differently: Take 100 mg by mouth daily after supper. At 8 pm) 45 tablet 3  . apixaban (ELIQUIS) 5 MG TABS tablet Take 5 mg by mouth 2 (two) times daily.     . carvedilol (COREG) 12.5 MG tablet TAKE 1 TABLET TWICE A DAY (Patient taking differently: Take 12.5 mg by mouth 2 (two) times daily with a meal. ) 180 tablet 3  . cholecalciferol (VITAMIN D3) 25 MCG (1000 UNIT) tablet Take 1,000 Units by mouth daily.     . empagliflozin (JARDIANCE) 10 MG TABS tablet Take 25 mg by mouth daily.     Marland Kitchen esomeprazole (NEXIUM) 40 MG capsule Take 40 mg by mouth daily after breakfast.     . furosemide (LASIX) 20 MG tablet Take 1 tablet (20 mg total) by mouth every other day. 45 tablet 3  . gabapentin (NEURONTIN) 300 MG capsule Take 600 mg by mouth daily as needed (pain).     Marland Kitchen lisinopril (ZESTRIL) 10 MG tablet Take 10 mg by mouth daily.     . metFORMIN (GLUCOPHAGE) 500 MG tablet Take 1,000 mg by mouth 2 (two) times daily with a meal.     . omega-3 acid ethyl esters (LOVAZA) 1 G capsule Take 1 g by mouth 2 (two) times daily.     Marland Kitchen rOPINIRole (REQUIP) 0.5 MG tablet Take 0.5 mg by mouth daily as needed (restless leg).    Marland Kitchen rOPINIRole (REQUIP) 1 MG tablet Take 1 mg by mouth at bedtime.     . sennosides-docusate sodium (  SENOKOT-S) 8.6-50 MG tablet Take 2 tablets by mouth daily. 30 tablet 1  . sildenafil (VIAGRA) 100 MG tablet Take 100 mg by mouth daily as needed for erectile dysfunction.    Marland Kitchen spironolactone (ALDACTONE) 25 MG tablet TAKE 1 TABLET DAILY IN THE MORNING (Patient taking differently: Take 25 mg by mouth daily. TAKE 1 TABLET DAILY IN THE MORNING) 90 tablet 3  . testosterone cypionate (DEPO-TESTOSTERONE) 200 MG/ML injection Inject 200 mg into the muscle See admin instructions. Inject 200 mg into the muscle every 10 days    . tiZANidine (ZANAFLEX) 4 MG capsule Take 4 mg by mouth 2 (two) times daily as needed for muscle spasms.      No current facility-administered medications for this visit.    Allergies:   Statins and Cortisone   Social History: Social History   Socioeconomic History  . Marital status: Married    Spouse name: Not on file  . Number of children: 2  . Years of education: 46  . Highest education level: Not on file  Occupational History  . Occupation: Retired  Tobacco Use  . Smoking status: Former Smoker    Types: Cigarettes    Quit date: 02/10/1973    Years since quitting: 46.4  .  Smokeless tobacco: Never Used  Substance and Sexual Activity  . Alcohol use: Yes    Comment: rare  . Drug use: No  . Sexual activity: Not on file    Comment: Married  Other Topics Concern  . Not on file  Social History Narrative   Lives at home w/ his wife, daughter and granddaughter   Left-handed   Caffeine: 1-2 diet sodas per day   Social Determinants of Health   Financial Resource Strain:   . Difficulty of Paying Living Expenses:   Food Insecurity:   . Worried About Charity fundraiser in the Last Year:   . Arboriculturist in the Last Year:   Transportation Needs:   . Film/video editor (Medical):   Marland Kitchen Lack of Transportation (Non-Medical):   Physical Activity:   . Days of Exercise per Week:   . Minutes of Exercise per Session:   Stress:   . Feeling of Stress :   Social Connections:   . Frequency of Communication with Friends and Family:   . Frequency of Social Gatherings with Friends and Family:   . Attends Religious Services:   . Active Member of Clubs or Organizations:   . Attends Archivist Meetings:   Marland Kitchen Marital Status:   Intimate Partner Violence:   . Fear of Current or Ex-Partner:   . Emotionally Abused:   Marland Kitchen Physically Abused:   . Sexually Abused:     Family History: Family History  Problem Relation Age of Onset  . Hypertension Mother   . Heart disease Mother   . Heart disease Father   . Hypertension Brother   . Heart attack Maternal Uncle   . Hypertension Brother   . Heart disease Paternal Uncle     Review of Systems: All other systems reviewed and are otherwise negative except as noted above.   Physical Exam: VS:  BP 120/72   Pulse 73   Ht 6' (1.829 m)   Wt 251 lb (113.9 kg)   SpO2 97%   BMI 34.04 kg/m  , BMI Body mass index is 34.04 kg/m.  GEN- The patient is well appearing, alert and oriented x 3 today.   HEENT: normocephalic, atraumatic; sclera clear, conjunctiva  pink; hearing intact; oropharynx clear; neck supple    Lungs- Clear to ausculation bilaterally, normal work of breathing.  No wheezes, rales, rhonchi Heart- Regular rate and rhythm (paced) GI- soft, non-tender, non-distended, bowel sounds present, no hepatosplenomegaly Extremities- no clubbing, cyanosis, 2+ BLE edema MS- no significant deformity or atrophy Skin- warm and dry, no rash or lesion; ICD pocket well healed Psych- euthymic mood, full affect Neuro- strength and sensation are intact  ICD interrogation- reviewed in detail today,  See PACEART report  EKG:  EKG is not ordered today.  Recent Labs: 08/13/2018: TSH 3.922 05/18/2019: ALT 23 05/27/2019: BUN 23; Creatinine, Ser 1.15; Potassium 3.8; Sodium 135 05/28/2019: Hemoglobin 14.5; Platelets 159   Wt Readings from Last 3 Encounters:  07/03/19 251 lb (113.9 kg)  05/26/19 248 lb 0.3 oz (112.5 kg)  05/18/19 248 lb (112.5 kg)     Other studies Reviewed: Additional studies/ records that were reviewed today include: AHF notes    Assessment and Plan:  1.  Chronic systolic dysfunction EF normalized post CRT Stable on an appropriate medical regimen Normal ICD function See Pace Art report No changes today Echo ordered by Dr Aundra Dubin for March - will schedule He has fairly significant LE edema today - increase Lasix to 40mg  bid x 3 days then back to 20mg  daily BMET today Compression hose encouraged. Will ask Sharman Cheek to call him on Monday and check on progress  2.  Paroxysmal atrial fibrillation Prior tachycardia induced cardiomyopathy Continue low does amiodarone - labs today  Continue Eliquis for CHADS2VASC of 4  3.  HTN Stable No change required today    Current medicines are reviewed at length with the patient today.   The patient does not have concerns regarding his medicines.  The following changes were made today:  none  Labs/ tests ordered today include: TSH, LFT's, echo  No orders of the defined types were placed in this encounter.    Disposition:   Follow up  with Delilah Shan, [name] 1 year, Dr Aundra Dubin 4-6 weeks    Signed, Chanetta Marshall, NP 07/03/2019 10:59 AM  [facility] [address] [place]  [place] [address] [phone] (office) [phone] (fax)

## 2019-07-03 NOTE — Patient Instructions (Addendum)
Medication Instructions:  LASIX: INCREASE TO 40 mg Twice Friday, Saturday and Sunday   *If you need a refill on your cardiac medications before your next appointment, please call your pharmacy*   Lab Work:  TODAY BMET TSH Hepatic Function Test If you have labs (blood work) drawn today and your tests are completely normal, you will receive your results only by: Marland Kitchen MyChart Message (if you have MyChart) OR . A paper copy in the mail If you have any lab test that is abnormal or we need to change your treatment, we will call you to review the results.   Testing/Procedures:  Please schedule Your physician has requested that you have an echocardiogram. Echocardiography is a painless test that uses sound waves to create images of your heart. It provides your doctor with information about the size and shape of your heart and how well your heart's chambers and valves are working. This procedure takes approximately one hour. There are no restrictions for this procedure.     Follow-Up: Dr Aundra Dubin in 4-6 weeks (Please schedule) At [facility], you and your health needs are our priority.  As part of our continuing mission to provide you with exceptional heart care, we have created designated Provider Care Teams.  These Care Teams include your primary Cardiologist (physician) and Advanced Practice Providers (APPs -  Physician Assistants and Nurse Practitioners) who all work together to provide you with the care you need, when you need it.    Your next appointment:   1 year(s)  The format for your next appointment:   Either In Person or Virtual  Provider:   [name]   Other Instructions Remote monitoring is used to monitor your ICD from home. This monitoring reduces the number of office visits required to check your device to one time per year. It allows [place] to keep an eye on the functioning of your device to ensure it is working properly. You are scheduled for a device check from home on  08/19/19. You may send your transmission at any time that day. If you have a wireless device, the transmission will be sent automatically. After your physician reviews your transmission, you will receive a postcard with your next transmission date.

## 2019-07-04 LAB — BASIC METABOLIC PANEL
BUN/Creatinine Ratio: 21 (ref 10–24)
BUN: 23 mg/dL (ref 8–27)
CO2: 23 mmol/L (ref 20–29)
Calcium: 9.3 mg/dL (ref 8.6–10.2)
Chloride: 98 mmol/L (ref 96–106)
Creatinine, Ser: 1.09 mg/dL (ref 0.76–1.27)
GFR calc Af Amer: 76 mL/min/{1.73_m2} (ref >59)
GFR calc non Af Amer: 66 mL/min/{1.73_m2} (ref >59)
Glucose: 113 mg/dL — ABNORMAL HIGH (ref 65–99)
Potassium: 4.6 mmol/L (ref 3.5–5.2)
Sodium: 136 mmol/L (ref 134–144)

## 2019-07-04 LAB — HEPATIC FUNCTION PANEL
ALT: 18 IU/L (ref 0–44)
AST: 18 IU/L (ref 0–40)
Albumin: 4.4 g/dL (ref 3.7–4.7)
Alkaline Phosphatase: 96 IU/L (ref 39–117)
Bilirubin Total: 0.4 mg/dL (ref 0.0–1.2)
Bilirubin, Direct: 0.16 mg/dL (ref 0.00–0.40)
Total Protein: 6.6 g/dL (ref 6.0–8.5)

## 2019-07-04 LAB — TSH: TSH: 4.17 u[IU]/mL (ref 0.450–4.500)

## 2019-07-06 ENCOUNTER — Ambulatory Visit (INDEPENDENT_AMBULATORY_CARE_PROVIDER_SITE_OTHER): Payer: Medicare Other

## 2019-07-06 ENCOUNTER — Telehealth: Payer: Self-pay

## 2019-07-06 DIAGNOSIS — I5022 Chronic systolic (congestive) heart failure: Secondary | ICD-10-CM

## 2019-07-06 DIAGNOSIS — Z96642 Presence of left artificial hip joint: Secondary | ICD-10-CM | POA: Diagnosis not present

## 2019-07-06 DIAGNOSIS — Z95810 Presence of automatic (implantable) cardiac defibrillator: Secondary | ICD-10-CM

## 2019-07-06 NOTE — Progress Notes (Signed)
EPIC Encounter for ICM Monitoring  Patient Name: Walter [name] is a 74 y.o. male Date: 07/06/2019 Primary Care Physican: London Pepper, MD Primary [place] Electrophysiologist: Allred Bi-V Pacing:>99% 3/2/2021Weight:246-247lbs   Spoke with patient. He was instructed to take Furosemide 40 mg bid x 3 days by Chanetta Marshall, NP at 4/16 OV and then return to 20 mg daily.  He reports leg edema is improved but do still have some swelling.   CorVuethoracic impedance returned to baseline normal today, 07/06/19 after taking extra Furosemide.   Furosemide20 mgTake 1 tablet (20 mg total) bymouthevery other day.  Labs: 07/03/2019 Creatinine 1.09, BUN 23, Potassium 4.6, Sodium 136, GFR 66-76 05/27/2019 Creatinine 1.15, BUN 23, Potassium 3.8, Sodium 135, GFR >60 05/18/2019 Creatinine 1.22, BUN 17, Potassium 3.9, Sodium 135, GFR 58->60 02/26/2019 Creatinine 1.18, BUN 15, Potassium 4.2, Sodium 140, GFR >60 12/04/2018 Creatinine 1.34, BUN 14, Potassium 4.1, Sodium 136, GFR 52->60 07/21/2018 Creatinine1.43, BUN22, Potassium4.3, Sodium137, [id number] A complete set of results can be found in Results Review.  Recommendations: No changes and encouraged to call for worsening fluid symptoms.  Follow-up plan: ICM clinic phone appointment on5/24/2021.91 day device clinic remote transmission6/04/2019. Office appt 08/10/2019 withDr Aundra Dubin.   Copy of ICM check sent to Dr.Allred and Chanetta Marshall, NP to provide update since 07/03/19 office visit.  3 month ICM trend: 07/06/2019    1 Year ICM trend:       Rosalene Billings, RN 07/06/2019 3:39 PM

## 2019-07-06 NOTE — Telephone Encounter (Signed)
ICM call to patient.  Advised Chanetta Marshall, NP wanted a follow up call with him today to check if leg edema has improved since taking Furosemide 40 mg bid x 3 days.  He said leg have definitely improved but are still swollen.  He had increased urine output with taking extra Furosemide and is back to prescribed dosage of 20 mg daily.  Advised the STJ monitor shows it is disconnected.  He reports he has not changed the location in the house and unsure why it is not connecting.  He will send remote transmission today for review.  Will update Chanetta Marshall, NP on patients symptoms.

## 2019-07-07 ENCOUNTER — Other Ambulatory Visit: Payer: Self-pay | Admitting: *Deleted

## 2019-07-07 DIAGNOSIS — I480 Paroxysmal atrial fibrillation: Secondary | ICD-10-CM

## 2019-07-07 DIAGNOSIS — I10 Essential (primary) hypertension: Secondary | ICD-10-CM

## 2019-07-07 DIAGNOSIS — I519 Heart disease, unspecified: Secondary | ICD-10-CM

## 2019-07-08 DIAGNOSIS — M25559 Pain in unspecified hip: Secondary | ICD-10-CM | POA: Diagnosis not present

## 2019-07-08 DIAGNOSIS — M47817 Spondylosis without myelopathy or radiculopathy, lumbosacral region: Secondary | ICD-10-CM | POA: Diagnosis not present

## 2019-07-08 DIAGNOSIS — G894 Chronic pain syndrome: Secondary | ICD-10-CM | POA: Diagnosis not present

## 2019-07-08 DIAGNOSIS — M1710 Unilateral primary osteoarthritis, unspecified knee: Secondary | ICD-10-CM | POA: Diagnosis not present

## 2019-07-09 ENCOUNTER — Other Ambulatory Visit: Payer: Medicare Other | Admitting: *Deleted

## 2019-07-09 ENCOUNTER — Other Ambulatory Visit: Payer: Self-pay

## 2019-07-09 DIAGNOSIS — I10 Essential (primary) hypertension: Secondary | ICD-10-CM

## 2019-07-09 DIAGNOSIS — I480 Paroxysmal atrial fibrillation: Secondary | ICD-10-CM

## 2019-07-09 DIAGNOSIS — I519 Heart disease, unspecified: Secondary | ICD-10-CM

## 2019-07-09 DIAGNOSIS — M1612 Unilateral primary osteoarthritis, left hip: Secondary | ICD-10-CM | POA: Diagnosis not present

## 2019-07-09 DIAGNOSIS — R262 Difficulty in walking, not elsewhere classified: Secondary | ICD-10-CM | POA: Diagnosis not present

## 2019-07-09 LAB — BASIC METABOLIC PANEL
BUN/Creatinine Ratio: 21 (ref 10–24)
BUN: 27 mg/dL (ref 8–27)
CO2: 27 mmol/L (ref 20–29)
Calcium: 9.4 mg/dL (ref 8.6–10.2)
Chloride: 98 mmol/L (ref 96–106)
Creatinine, Ser: 1.3 mg/dL — ABNORMAL HIGH (ref 0.76–1.27)
GFR calc Af Amer: 62 mL/min/{1.73_m2} (ref >59)
GFR calc non Af Amer: 53 mL/min/{1.73_m2} — ABNORMAL LOW (ref >59)
Glucose: 110 mg/dL — ABNORMAL HIGH (ref 65–99)
Potassium: 4.3 mmol/L (ref 3.5–5.2)
Sodium: 137 mmol/L (ref 134–144)

## 2019-07-10 ENCOUNTER — Telehealth: Payer: Self-pay

## 2019-07-10 NOTE — Telephone Encounter (Signed)
The patient has been notified of the lab result and verbalized understanding.  All questions (if any) were answered. Frederik Schmidt, RN 07/10/2019 11:42 AM

## 2019-07-10 NOTE — Telephone Encounter (Signed)
-----   Message from Walter Berthold, NP sent at 07/10/2019 11:37 AM EDT ----- Please notify patient of stable labs. Expected slight rise in kidney function, potassium stable. Thanks!

## 2019-07-16 DIAGNOSIS — M1612 Unilateral primary osteoarthritis, left hip: Secondary | ICD-10-CM | POA: Diagnosis not present

## 2019-07-16 DIAGNOSIS — R262 Difficulty in walking, not elsewhere classified: Secondary | ICD-10-CM | POA: Diagnosis not present

## 2019-07-20 ENCOUNTER — Ambulatory Visit (HOSPITAL_COMMUNITY): Payer: Medicare Other | Attending: Cardiology

## 2019-07-20 ENCOUNTER — Other Ambulatory Visit: Payer: Self-pay

## 2019-07-20 DIAGNOSIS — I10 Essential (primary) hypertension: Secondary | ICD-10-CM | POA: Diagnosis not present

## 2019-07-20 DIAGNOSIS — I480 Paroxysmal atrial fibrillation: Secondary | ICD-10-CM | POA: Diagnosis not present

## 2019-07-20 DIAGNOSIS — I519 Heart disease, unspecified: Secondary | ICD-10-CM | POA: Insufficient documentation

## 2019-07-20 DIAGNOSIS — Z79899 Other long term (current) drug therapy: Secondary | ICD-10-CM | POA: Diagnosis not present

## 2019-08-05 DIAGNOSIS — G894 Chronic pain syndrome: Secondary | ICD-10-CM | POA: Diagnosis not present

## 2019-08-05 DIAGNOSIS — M47817 Spondylosis without myelopathy or radiculopathy, lumbosacral region: Secondary | ICD-10-CM | POA: Diagnosis not present

## 2019-08-05 DIAGNOSIS — M1710 Unilateral primary osteoarthritis, unspecified knee: Secondary | ICD-10-CM | POA: Diagnosis not present

## 2019-08-05 DIAGNOSIS — M25559 Pain in unspecified hip: Secondary | ICD-10-CM | POA: Diagnosis not present

## 2019-08-10 ENCOUNTER — Ambulatory Visit (HOSPITAL_COMMUNITY)
Admission: RE | Admit: 2019-08-10 | Discharge: 2019-08-10 | Disposition: A | Discharge status: Home / Self Care | Payer: Medicare Other | Source: Ambulatory Visit | Attending: Cardiology | Admitting: Cardiology

## 2019-08-10 ENCOUNTER — Encounter (HOSPITAL_COMMUNITY): Payer: Self-pay | Admitting: Cardiology

## 2019-08-10 ENCOUNTER — Other Ambulatory Visit: Payer: Self-pay

## 2019-08-10 VITALS — BP 122/72 | HR 77 | Wt 260.0 lb

## 2019-08-10 DIAGNOSIS — I519 Heart disease, unspecified: Secondary | ICD-10-CM | POA: Diagnosis not present

## 2019-08-10 DIAGNOSIS — K219 Gastro-esophageal reflux disease without esophagitis: Secondary | ICD-10-CM | POA: Insufficient documentation

## 2019-08-10 DIAGNOSIS — Z87891 Personal history of nicotine dependence: Secondary | ICD-10-CM | POA: Diagnosis not present

## 2019-08-10 DIAGNOSIS — I429 Cardiomyopathy, unspecified: Secondary | ICD-10-CM | POA: Insufficient documentation

## 2019-08-10 DIAGNOSIS — Z7984 Long term (current) use of oral hypoglycemic drugs: Secondary | ICD-10-CM | POA: Insufficient documentation

## 2019-08-10 DIAGNOSIS — R251 Tremor, unspecified: Secondary | ICD-10-CM | POA: Diagnosis not present

## 2019-08-10 DIAGNOSIS — I480 Paroxysmal atrial fibrillation: Secondary | ICD-10-CM

## 2019-08-10 DIAGNOSIS — M791 Myalgia, unspecified site: Secondary | ICD-10-CM | POA: Diagnosis not present

## 2019-08-10 DIAGNOSIS — E119 Type 2 diabetes mellitus without complications: Secondary | ICD-10-CM | POA: Insufficient documentation

## 2019-08-10 DIAGNOSIS — Z7901 Long term (current) use of anticoagulants: Secondary | ICD-10-CM | POA: Insufficient documentation

## 2019-08-10 DIAGNOSIS — D6851 Activated protein C resistance: Secondary | ICD-10-CM | POA: Diagnosis not present

## 2019-08-10 DIAGNOSIS — Z8249 Family history of ischemic heart disease and other diseases of the circulatory system: Secondary | ICD-10-CM | POA: Diagnosis not present

## 2019-08-10 DIAGNOSIS — E785 Hyperlipidemia, unspecified: Secondary | ICD-10-CM | POA: Insufficient documentation

## 2019-08-10 DIAGNOSIS — M25552 Pain in left hip: Secondary | ICD-10-CM | POA: Insufficient documentation

## 2019-08-10 DIAGNOSIS — I110 Hypertensive heart disease with heart failure: Secondary | ICD-10-CM | POA: Diagnosis not present

## 2019-08-10 DIAGNOSIS — I2510 Atherosclerotic heart disease of native coronary artery without angina pectoris: Secondary | ICD-10-CM | POA: Insufficient documentation

## 2019-08-10 DIAGNOSIS — Z79899 Other long term (current) drug therapy: Secondary | ICD-10-CM | POA: Diagnosis not present

## 2019-08-10 DIAGNOSIS — I509 Heart failure, unspecified: Secondary | ICD-10-CM | POA: Insufficient documentation

## 2019-08-10 LAB — CBC
HCT: 48.9 % (ref 39.0–52.0)
Hemoglobin: 15.5 g/dL (ref 13.0–17.0)
MCH: 30.8 pg (ref 26.0–34.0)
MCHC: 31.7 g/dL (ref 30.0–36.0)
MCV: 97 fL (ref 80.0–100.0)
Platelets: 227 10*3/uL (ref 150–400)
RBC: 5.04 MIL/uL (ref 4.22–5.81)
RDW: 14.8 % (ref 11.5–15.5)
WBC: 5.7 10*3/uL (ref 4.0–10.5)
nRBC: 0 % (ref 0.0–0.2)

## 2019-08-10 LAB — COMPREHENSIVE METABOLIC PANEL
ALT: 20 U/L (ref 0–44)
AST: 21 U/L (ref 15–41)
Albumin: 3.6 g/dL (ref 3.5–5.0)
Alkaline Phosphatase: 72 U/L (ref 38–126)
Anion gap: 8 (ref 5–15)
BUN: 38 mg/dL — ABNORMAL HIGH (ref 8–23)
CO2: 23 mmol/L (ref 22–32)
Calcium: 9 mg/dL (ref 8.9–10.3)
Chloride: 104 mmol/L (ref 98–111)
Creatinine, Ser: 1.48 mg/dL — ABNORMAL HIGH (ref 0.61–1.24)
GFR calc Af Amer: 53 mL/min — ABNORMAL LOW (ref >60)
GFR calc non Af Amer: 46 mL/min — ABNORMAL LOW (ref >60)
Glucose, Bld: 177 mg/dL — ABNORMAL HIGH (ref 70–99)
Potassium: 5.5 mmol/L — ABNORMAL HIGH (ref 3.5–5.1)
Sodium: 135 mmol/L (ref 135–145)
Total Bilirubin: 1.5 mg/dL — ABNORMAL HIGH (ref 0.3–1.2)
Total Protein: 6.3 g/dL — ABNORMAL LOW (ref 6.5–8.1)

## 2019-08-10 MED ORDER — FUROSEMIDE 20 MG PO TABS: 20.0000 mg | ORAL_TABLET | ORAL | 3 refills | Status: DC

## 2019-08-10 NOTE — Progress Notes (Signed)
ICM remote transmission rescheduled for 08/20/2019 since patient has OV with Dr Aundra Dubin today, 08/10/2019.

## 2019-08-10 NOTE — Progress Notes (Signed)
Date:  08/10/2019   ID:  Walter [name], DOB [birth year], MRN [id number]   Provider location: [place] [facility] Type of Visit: Established patient   PCP:  London Pepper, MD  Cardiologist:  Dr. Aundra Dubin   History of Present Illness: Walter [name] is a 74 y.o. male who has a history of paroxysmal atrial fibrillation, chronic LBBB and cardiomyopathy.  He was found to have LBBB 15-20 years ago.  In 2011, he had a stress test in [facility] that was abnormal so he was taken for [facility] in 6/11 that showed nonobstructive disease.  He had an echo in 5/11 that showed EF 45-50%, diffuse hypokinesis suggesting a mild nonischemic cardiomyopathy.   He has admitted in 7/15 with atrial fibrillation with RVR.  Rate was very difficult to control.  TEE failed due to inability to pass probe x 2.  Eventually, ICE was done to rule out LAA thrombus and he was cardioverted to NSR.  He is on amiodarone now and in NSR.  Echo in 7/15 showed EF 20% with diffuse hypokinesis in setting of atrial fibrillation/RVR.  LHC was also done in 7/15 with minimal coronary disease.  Repeat echo in 10/15 showed EF 20-25%.  In 11/15, he had St Jude CRT-D device placed.  Repeat echo in 4/16 showed EF improved to 50% with mild LVH. Repeat echo in 11/17 showed EF 50-55%, mildly dilated RV with normal systolic function.  Echo in 12/19 again showed EF 50-55% with moderate LVH.   Left THR in 3/21.   Echo (5/21): EF 55%, mild LVH, normal RV, PASP 31 mmHg.   He returns for followup of CHF and paroxysmal atrial fibrillation.  Still with left hip pain but able to walk without a cane.  No palpitations, he is in NSR today.  No significant exertional dyspnea.  No chest pain.  He still has a chronic tremor.  He has chronic ankle edema.   ECG (personally reviewed): NSR, BiV paced  Labs (11/14): K 3.9, creatinine 1.14, LDL 104, LFTs normal Labs (2/15): HCT 56.3 Labs (9/15): K 4.1, creatinine 1.1, digoxin 0.9, TSH normal, LFTs  normal Labs (10/15): TGs 262, LDL 82, BNP 68 Labs (11/15): K 4.1, creatinine 1.12 Labs (12/15): K 4.8, creatinine 1.02 Labs (3/16): K 4.6, creatinine 1.06, digoxin 0.8, LFTs normal, TSH normal Labs (11/16): K 4.2, creatinine 1.17 => 1.3, HCT 56.2, TSH normal, LDL 75, HDL 34, LFTs normal Labs (2/17): K 4.4, creatinine 1.18, BNP 20, HCT 50.8, LFTs normal Labs (12/17): K 4.3, creatinine 1.4 Labs (1/18): K 4.8, creatinine 1.26, hgb 18.1, LFTs normal, TSH normal.  Labs (2/19): K 4, creatinine 1.5 Labs (11/19): LDL 58 Labs (5/20): K 4.3, creatinine 1.43 Labs (4/21): K 4.3, creatinine 1.3, TSH normal, LFTs normal  PMH: 1. Type II diabetes 2. HTN 3. Adrenal nodule 4. Restless leg syndrome 5. OA with right hip pain, right frozen shoulder, low back pain. S/p R THR in 12/16.  S/p right shoulder replacement in 2/19. s/p left THR in 3/21.  6. Testicular cancer: 1972 7. Chronic LBBB: Had Cardiolite in 2011 that was abnormal so had LHC in 6/11 in [place].  This showed 30% LCx stenosis and 40% ostial RCA stenosis.  LHC in 7/15 in [place] showed minimal coronary disease.  8. 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   - Echo (5/11) with EF 45-50%, moderate LVH.  - Echo (7/15) with EF 20%, diffuse hypokinesis, mild to moderately decreased RV  systolic function (in setting of afib with RVR).  - LHC (7/15) with minimal coronary disease.   - Echo (10/15) with EF 20-25%, mild LVH, moderate LAE.  - St Jude CRT-D device placed in 11/15.  - Echo (4/16) with EF 50%, mild LVH.  - Echo (11/17): EF 50-55%, mildly dilated RV with normal systolic function.  - Echo (12/19): EF 50-55%, moderate LVH, mildly dilated RV with normal systolic function.  - Echo (5/21): EF 55%, mild LVH, RV normal, PASP 31 mmHg.  9. Factor V Leiden + 10. Asbestosis: Mild.  Exposure while in the [employer].  11. GERD 12. PVCs 13. Polycythemia: Uncertain  etiology 14. Hyperlipidemia: Myalgias with Zocor and Lipitor.  15. Atrial fibrillation: Paroxysmal.  Unable to pass TEE probe.   Current Outpatient Medications  Medication Sig Dispense Refill  . albuterol (PROVENTIL HFA;VENTOLIN HFA) 108 (90 Base) MCG/ACT inhaler Inhale 1-2 puffs into the lungs every 6 (six) hours as needed for wheezing or shortness of breath.    Marland Kitchen amiodarone (PACERONE) 200 MG tablet Take 100 mg by mouth daily.    Marland Kitchen apixaban (ELIQUIS) 5 MG TABS tablet Take 5 mg by mouth 2 (two) times daily.     . carvedilol (COREG) 12.5 MG tablet Take 12.5 mg by mouth 2 (two) times daily with a meal.    . cholecalciferol (VITAMIN D3) 25 MCG (1000 UNIT) tablet Take 1,000 Units by mouth daily.    . empagliflozin (JARDIANCE) 25 MG TABS tablet Take 25 mg by mouth daily.    Marland Kitchen esomeprazole (NEXIUM) 40 MG capsule Take 40 mg by mouth daily after breakfast.     . furosemide (LASIX) 20 MG tablet Take 1 tablet (20 mg total) by mouth every other day. 90 tablet 3  . gabapentin (NEURONTIN) 300 MG capsule Take 600 mg by mouth daily as needed (pain).     Marland Kitchen lisinopril (ZESTRIL) 10 MG tablet Take 10 mg by mouth daily.     . metFORMIN (GLUCOPHAGE) 500 MG tablet Take 1,000 mg by mouth 2 (two) times daily with a meal.     . methocarbamol (ROBAXIN) 750 MG tablet Take 750 mg by mouth every 6 (six) hours as needed for muscle spasms.    Marland Kitchen omega-3 acid ethyl esters (LOVAZA) 1 G capsule Take 1 g by mouth 2 (two) times daily.     Marland Kitchen oxyCODONE-acetaminophen (PERCOCET) 10-325 MG tablet Take 1 tablet by mouth 5 (five) times daily as needed.    Marland Kitchen rOPINIRole (REQUIP) 0.5 MG tablet Take 0.5 mg by mouth daily as needed (restless leg).    Marland Kitchen rOPINIRole (REQUIP) 1 MG tablet Take 1 mg by mouth at bedtime.     . sennosides-docusate sodium (SENOKOT-S) 8.6-50 MG tablet Take 2 tablets by mouth daily. 30 tablet 1  . sildenafil (VIAGRA) 100 MG tablet Take 100 mg by mouth daily as needed for erectile dysfunction.    Marland Kitchen spironolactone  (ALDACTONE) 25 MG tablet Take 25 mg by mouth daily.    Marland Kitchen testosterone cypionate (DEPO-TESTOSTERONE) 200 MG/ML injection Inject 200 mg into the muscle See admin instructions. Inject 200 mg into the muscle every 10 days    . tiZANidine (ZANAFLEX) 4 MG capsule Take 4 mg by mouth 2 (two) times daily as needed for muscle spasms.      No current facility-administered medications for this encounter.    Allergies:   Statins and Cortisone   Social History:  The patient  reports that he quit smoking about 46 years ago. His  smoking use included cigarettes. He has never used smokeless tobacco. He reports current alcohol use. He reports that he does not use drugs.   Family History:  The patient's family history includes Heart attack in his maternal uncle; Heart disease in his father, mother, and paternal uncle; Hypertension in his brother, brother, and mother.   ROS:  Please see the history of present illness.   All other systems are personally reviewed and negative.   Exam:   BP 122/72   Pulse 77   Wt 117.9 kg (260 lb)   SpO2 96%   BMI 35.26 kg/m  General: NAD Neck: No JVD, no thyromegaly or thyroid nodule.  Lungs: Clear to auscultation bilaterally with normal respiratory effort. CV: Nondisplaced PMI.  Heart regular S1/S2, no S3/S4, no murmur.  1+ ankle edema.  No carotid bruit.  Normal pedal pulses.  Abdomen: Soft, nontender, no hepatosplenomegaly, no distention.  Skin: Intact without lesions or rashes.  Neurologic: Alert and oriented x 3.  Psych: Normal affect. Extremities: No clubbing or cyanosis.  HEENT: Normal.   Recent Labs: 07/03/2019: TSH 4.170 08/10/2019: ALT 20; BUN 38; Creatinine, Ser 1.48; Hemoglobin 15.5; Platelets 227; Potassium 5.5; Sodium 135  Personally reviewed   Wt Readings from Last 3 Encounters:  08/10/19 117.9 kg (260 lb)  07/03/19 113.9 kg (251 lb)  05/26/19 112.5 kg (248 lb 0.3 oz)      ASSESSMENT AND PLAN:  1. Cardiomyopathy: Nonischemic.  There was a  pre-existing possible LBBB cardiomyopathy, but EF was down to 20% in the setting of atrial fibrillation with RVR in 7/15 (suggesting tachycardia-mediated cardiomyopathy).  However, EF did not improve in NSR (20-25% on repeat echo in 10/15).  Now with St Jude CRT-D device.  No significant coronary disease on 7/15 LHC.  Since placement of CRT-D, EF has improved to 55% (5/21 echo).  NYHA class II symptoms.  He does not look volume overloaded on exam today (has chronic peripheral edema but no JVD).  - Continue current Coreg, spironolactone, and lisinopril.  BMET today.   - Continue Lasix 20 mg daily.  - Continue empagliflozin 2. CAD: Nonobstructive, mild. No chest pain.  Continue statin.  No aspirin as he is anticoagulated.  3. Hyperlipidemia: Continue lovastatin.  Myalgias with other statins.   4. HTN: BP is controlled. 5. Atrial fibrillation: Paroxysmal, now in NSR on amiodarone 100 mg daily => dose decreased due to tremor, which remains present.  He has seen Dr. Jannifer Franklin with neurology about this.  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   - Continue Eliquis.  - Given amiodarone use, check LFTs and TSH. Will need yearly eye exam.  - I will refer back to Dr. Rayann Heman to look at him for atrial fibrillation ablation.  This would likely allow [place] to safely stop his amiodarone.   Followup in 6 months  Signed, Loralie Champagne, MD  08/10/2019   [facility] [address] [facility] [place] [address] [phone] (office) [phone] (fax)

## 2019-08-10 NOTE — Patient Instructions (Addendum)
Keep Lasix at 20mg  (1 tab) daily   Labs today We will only contact you if something comes back abnormal or we need to make some changes. Otherwise no news is good news!   Your physician wants you to follow-up in: 6 months.  You will receive a reminder letter in the mail two months in advance. If you don't receive a letter, please call our office to schedule the follow-up appointment.  An appointment have been scheduled for your wife.    Please call office at [phone] option 2 if you have any questions or concerns.   At the [facility], you and your health needs are our priority. As part of our continuing mission to provide you with exceptional heart care, we have created designated Provider Care Teams. These Care Teams include your primary Cardiologist (physician) and Advanced Practice Providers (APPs- Physician Assistants and Nurse Practitioners) who all work together to provide you with the care you need, when you need it.   You may see any of the following providers on your designated Care Team at your next follow up: Marland Kitchen Dr Glori Bickers . Dr Loralie Champagne . Darrick Grinder, NP . Lyda Jester, PA . Audry Riles, PharmD   Please be sure to bring in all your medications bottles to every appointment.

## 2019-08-12 ENCOUNTER — Telehealth (HOSPITAL_COMMUNITY): Payer: Self-pay

## 2019-08-12 DIAGNOSIS — I519 Heart disease, unspecified: Secondary | ICD-10-CM

## 2019-08-12 NOTE — Telephone Encounter (Signed)
-----   Message from Larey Dresser, MD sent at 08/10/2019  9:19 PM EDT ----- Stop spironolactone, repeat BMET 1 week

## 2019-08-12 NOTE — Telephone Encounter (Signed)
Patient returned call about lab results. Patient advised and verbalized understanding.lab appt scheduled and lab order entered. Med list updated to reflect changes

## 2019-08-19 ENCOUNTER — Other Ambulatory Visit: Payer: Self-pay

## 2019-08-19 ENCOUNTER — Other Ambulatory Visit (HOSPITAL_COMMUNITY): Payer: Self-pay | Admitting: Orthopedic Surgery

## 2019-08-19 ENCOUNTER — Ambulatory Visit (INDEPENDENT_AMBULATORY_CARE_PROVIDER_SITE_OTHER): Payer: Medicare Other | Admitting: *Deleted

## 2019-08-19 ENCOUNTER — Ambulatory Visit (HOSPITAL_COMMUNITY)
Admission: RE | Admit: 2019-08-19 | Discharge: 2019-08-19 | Disposition: A | Discharge status: Home / Self Care | Payer: Medicare Other | Source: Ambulatory Visit | Attending: Internal Medicine | Admitting: Internal Medicine

## 2019-08-19 DIAGNOSIS — I428 Other cardiomyopathies: Secondary | ICD-10-CM | POA: Diagnosis not present

## 2019-08-19 DIAGNOSIS — I4819 Other persistent atrial fibrillation: Secondary | ICD-10-CM

## 2019-08-19 DIAGNOSIS — M79604 Pain in right leg: Secondary | ICD-10-CM | POA: Diagnosis not present

## 2019-08-19 DIAGNOSIS — M7989 Other specified soft tissue disorders: Secondary | ICD-10-CM | POA: Diagnosis not present

## 2019-08-19 DIAGNOSIS — Z96642 Presence of left artificial hip joint: Secondary | ICD-10-CM | POA: Diagnosis not present

## 2019-08-19 DIAGNOSIS — M79661 Pain in right lower leg: Secondary | ICD-10-CM | POA: Diagnosis not present

## 2019-08-19 LAB — CUP PACEART REMOTE DEVICE CHECK
Battery Remaining Longevity: 24 mo
Battery Remaining Percentage: 29 %
Battery Voltage: 2.86 V
Brady Statistic AP VP Percent: 1.1 %
Brady Statistic AP VS Percent: 1 %
Brady Statistic AS VP Percent: 98 %
Brady Statistic AS VS Percent: 1 %
Brady Statistic RA Percent Paced: 1.1 %
Date Time Interrogation Session: 20210602130117
HighPow Impedance: 68 Ohm
HighPow Impedance: 68 Ohm
Implantable Lead Implant Date: 20151119
Implantable Lead Implant Date: 20151119
Implantable Lead Implant Date: 20151119
Implantable Lead Location: 753858
Implantable Lead Location: 753859
Implantable Lead Location: 753860
Implantable Pulse Generator Implant Date: 20151119
Lead Channel Impedance Value: 340 Ohm
Lead Channel Impedance Value: 390 Ohm
Lead Channel Impedance Value: 530 Ohm
Lead Channel Pacing Threshold Amplitude: 0.75 V
Lead Channel Pacing Threshold Amplitude: 0.75 V
Lead Channel Pacing Threshold Amplitude: 0.75 V
Lead Channel Pacing Threshold Pulse Width: 0.5 ms
Lead Channel Pacing Threshold Pulse Width: 0.5 ms
Lead Channel Pacing Threshold Pulse Width: 0.5 ms
Lead Channel Sensing Intrinsic Amplitude: 12 mV
Lead Channel Sensing Intrinsic Amplitude: 2.1 mV
Lead Channel Setting Pacing Amplitude: 1.5 V
Lead Channel Setting Pacing Amplitude: 2 V
Lead Channel Setting Pacing Amplitude: 2.5 V
Lead Channel Setting Pacing Pulse Width: 0.5 ms
Lead Channel Setting Pacing Pulse Width: 0.5 ms
Lead Channel Setting Sensing Sensitivity: 0.5 mV
Pulse Gen Serial Number: 7211486

## 2019-08-21 ENCOUNTER — Ambulatory Visit (INDEPENDENT_AMBULATORY_CARE_PROVIDER_SITE_OTHER): Payer: Medicare Other

## 2019-08-21 ENCOUNTER — Other Ambulatory Visit (HOSPITAL_COMMUNITY): Payer: Medicare Other

## 2019-08-21 DIAGNOSIS — Z95810 Presence of automatic (implantable) cardiac defibrillator: Secondary | ICD-10-CM | POA: Diagnosis not present

## 2019-08-21 DIAGNOSIS — I5022 Chronic systolic (congestive) heart failure: Secondary | ICD-10-CM | POA: Diagnosis not present

## 2019-08-24 NOTE — Progress Notes (Signed)
Remote ICD transmission.   

## 2019-08-25 ENCOUNTER — Telehealth: Payer: Self-pay

## 2019-08-25 ENCOUNTER — Other Ambulatory Visit: Payer: Self-pay

## 2019-08-25 ENCOUNTER — Ambulatory Visit (HOSPITAL_COMMUNITY)
Admission: RE | Admit: 2019-08-25 | Discharge: 2019-08-25 | Disposition: A | Discharge status: Home / Self Care | Payer: Medicare Other | Source: Ambulatory Visit | Attending: Cardiology | Admitting: Cardiology

## 2019-08-25 DIAGNOSIS — I519 Heart disease, unspecified: Secondary | ICD-10-CM | POA: Diagnosis not present

## 2019-08-25 LAB — BASIC METABOLIC PANEL
Anion gap: 9 (ref 5–15)
BUN: 16 mg/dL (ref 8–23)
CO2: 25 mmol/L (ref 22–32)
Calcium: 8.7 mg/dL — ABNORMAL LOW (ref 8.9–10.3)
Chloride: 103 mmol/L (ref 98–111)
Creatinine, Ser: 1.13 mg/dL (ref 0.61–1.24)
GFR calc Af Amer: >60 mL/min (ref >60)
GFR calc non Af Amer: >60 mL/min (ref >60)
Glucose, Bld: 136 mg/dL — ABNORMAL HIGH (ref 70–99)
Potassium: 4.1 mmol/L (ref 3.5–5.1)
Sodium: 137 mmol/L (ref 135–145)

## 2019-08-25 NOTE — Telephone Encounter (Signed)
Remote ICM transmission received.  Attempted call to patient regarding ICM remote transmission and left detailed message per DPR.  Advised to return call for any fluid symptoms or questions. Next ICM remote transmission scheduled 09/28/2019.     

## 2019-08-25 NOTE — Progress Notes (Signed)
EPIC Encounter for ICM Monitoring  Patient Name: Walter [name] is a 74 y.o. male Date: 08/25/2019 Primary Care Physican: London Pepper, MD Primary Cardiologist:[place] Electrophysiologist: Allred Bi-V Pacing:>99% 3/2/2021Weight:246-247lbs   Attempted call to patient and unable to reach.  Left detailed message per DPR regarding transmission. Transmission reviewed.   CorVuethoracic impedancereturned to baseline normal today, 07/06/19 after taking extra Furosemide.   Furosemide20 mgTake 1 tablet (20 mg total) bymouthevery other day.  Labs:  BMET scheduled for 08/25/2019 08/10/2019 Creatinine 1.48, BUN 38, Potassium 5.5, Sodium 135, GFR 46-53 07/03/2019 Creatinine 1.09, BUN 23, Potassium 4.6, Sodium 136, GFR 66-76 05/27/2019 Creatinine 1.15, BUN 23, Potassium 3.8, Sodium 135, GFR >60 05/18/2019 Creatinine 1.22, BUN 17, Potassium 3.9, Sodium 135, GFR 58->60 02/26/2019 Creatinine 1.18, BUN 15, Potassium 4.2, Sodium 140, GFR >60 12/04/2018 Creatinine 1.34, BUN 14, Potassium 4.1, Sodium 136, GFR 52->60 07/21/2018 Creatinine1.43, BUN22, Potassium4.3, Sodium137, [id number] A complete set of results can be found in Results Review.  Recommendations: Left voice mail with ICM number and encouraged to call if experiencing any fluid symptoms.  Follow-up plan: ICM clinic phone appointment on7/02/2020.91 day device clinic remote transmission9/03/2019.  Copy of ICM check sent to Dr.Allred.   3 month ICM trend: 08/19/2019    1 Year ICM trend:       Rosalene Billings, RN 08/25/2019 8:21 AM

## 2019-09-02 DIAGNOSIS — M1710 Unilateral primary osteoarthritis, unspecified knee: Secondary | ICD-10-CM | POA: Diagnosis not present

## 2019-09-02 DIAGNOSIS — M47817 Spondylosis without myelopathy or radiculopathy, lumbosacral region: Secondary | ICD-10-CM | POA: Diagnosis not present

## 2019-09-02 DIAGNOSIS — M25559 Pain in unspecified hip: Secondary | ICD-10-CM | POA: Diagnosis not present

## 2019-09-02 DIAGNOSIS — G894 Chronic pain syndrome: Secondary | ICD-10-CM | POA: Diagnosis not present

## 2019-09-04 DIAGNOSIS — F4542 Pain disorder with related psychological factors: Secondary | ICD-10-CM | POA: Diagnosis not present

## 2019-09-04 DIAGNOSIS — G894 Chronic pain syndrome: Secondary | ICD-10-CM | POA: Diagnosis not present

## 2019-09-04 DIAGNOSIS — M792 Neuralgia and neuritis, unspecified: Secondary | ICD-10-CM | POA: Diagnosis not present

## 2019-09-04 DIAGNOSIS — G609 Hereditary and idiopathic neuropathy, unspecified: Secondary | ICD-10-CM | POA: Diagnosis not present

## 2019-09-08 DIAGNOSIS — E119 Type 2 diabetes mellitus without complications: Secondary | ICD-10-CM | POA: Diagnosis not present

## 2019-09-08 DIAGNOSIS — H2513 Age-related nuclear cataract, bilateral: Secondary | ICD-10-CM | POA: Diagnosis not present

## 2019-09-25 DIAGNOSIS — Z7184 Encounter for health counseling related to travel: Secondary | ICD-10-CM | POA: Diagnosis not present

## 2019-09-25 DIAGNOSIS — Z1211 Encounter for screening for malignant neoplasm of colon: Secondary | ICD-10-CM | POA: Diagnosis not present

## 2019-09-25 DIAGNOSIS — I4891 Unspecified atrial fibrillation: Secondary | ICD-10-CM | POA: Diagnosis not present

## 2019-09-25 DIAGNOSIS — Z0000 Encounter for general adult medical examination without abnormal findings: Secondary | ICD-10-CM | POA: Diagnosis not present

## 2019-09-25 DIAGNOSIS — E291 Testicular hypofunction: Secondary | ICD-10-CM | POA: Diagnosis not present

## 2019-09-25 DIAGNOSIS — I10 Essential (primary) hypertension: Secondary | ICD-10-CM | POA: Diagnosis not present

## 2019-09-25 DIAGNOSIS — E1169 Type 2 diabetes mellitus with other specified complication: Secondary | ICD-10-CM | POA: Diagnosis not present

## 2019-09-25 DIAGNOSIS — E785 Hyperlipidemia, unspecified: Secondary | ICD-10-CM | POA: Diagnosis not present

## 2019-09-28 ENCOUNTER — Ambulatory Visit (INDEPENDENT_AMBULATORY_CARE_PROVIDER_SITE_OTHER): Payer: Medicare Other

## 2019-09-28 DIAGNOSIS — Z95810 Presence of automatic (implantable) cardiac defibrillator: Secondary | ICD-10-CM | POA: Diagnosis not present

## 2019-09-28 DIAGNOSIS — I519 Heart disease, unspecified: Secondary | ICD-10-CM

## 2019-09-30 ENCOUNTER — Telehealth: Payer: Self-pay

## 2019-09-30 NOTE — Telephone Encounter (Signed)
LMOVM for pt to send missed transmission. 

## 2019-10-02 NOTE — Progress Notes (Signed)
EPIC Encounter for ICM Monitoring  Patient Name: Walter [name] is a 74 y.o. male Date: 10/02/2019 Primary Care Physican: London Pepper, MD Primary [place] Electrophysiologist: Allred Bi-V Pacing:99% 7/16/2021Weight:246-247lbs   Spoke with patient and reports feeling well at this time.  Denies fluid symptoms.    CorVuethoracic impedancenormal.   Furosemide20 mgTake 1 tablet (20 mg total) bymouthevery other day.  Labs:  08/25/2019 Creatinine 1.13, BUN 16, Potassium 4.1, Sodium 137, GFR >60 08/10/2019 Creatinine 1.48, BUN 38, Potassium 5.5, Sodium 135, GFR 46-53 07/03/2019 Creatinine 1.09, BUN 23, Potassium 4.6, Sodium 136, GFR 66-76 A complete set of results can be found in Results Review.  Recommendations: No changes and encouraged to call if experiencing any fluid symptoms.  Follow-up plan: ICM clinic phone appointment on8/16/2021.91 day device clinic remote transmission9/03/2019.   EP/Cardiology Office Visits:  02/10/2020 with Dr Aundra Dubin will be 6 month follow up but no appt scheduled.  Recall 07/02/2020 with Dr. Rayann Heman.    Copy of ICM check sent to Dr. Rayann Heman.   3 month ICM trend: 10/02/2019    1 Year ICM trend:       Rosalene Billings, RN 10/02/2019 2:31 PM

## 2019-10-05 DIAGNOSIS — Z79891 Long term (current) use of opiate analgesic: Secondary | ICD-10-CM | POA: Diagnosis not present

## 2019-10-05 DIAGNOSIS — M1710 Unilateral primary osteoarthritis, unspecified knee: Secondary | ICD-10-CM | POA: Diagnosis not present

## 2019-10-05 DIAGNOSIS — G894 Chronic pain syndrome: Secondary | ICD-10-CM | POA: Diagnosis not present

## 2019-10-05 DIAGNOSIS — M47817 Spondylosis without myelopathy or radiculopathy, lumbosacral region: Secondary | ICD-10-CM | POA: Diagnosis not present

## 2019-10-05 DIAGNOSIS — M25559 Pain in unspecified hip: Secondary | ICD-10-CM | POA: Diagnosis not present

## 2019-10-05 DIAGNOSIS — Z79899 Other long term (current) drug therapy: Secondary | ICD-10-CM | POA: Diagnosis not present

## 2019-10-07 DIAGNOSIS — N289 Disorder of kidney and ureter, unspecified: Secondary | ICD-10-CM | POA: Diagnosis not present

## 2019-10-08 ENCOUNTER — Inpatient Hospital Stay (HOSPITAL_BASED_OUTPATIENT_CLINIC_OR_DEPARTMENT_OTHER)
Admission: EM | Admit: 2019-10-08 | Discharge: 2019-10-10 | DRG: 392 | Disposition: A | Discharge status: Home / Self Care | Payer: Medicare Other | Attending: Family Medicine | Admitting: Family Medicine

## 2019-10-08 ENCOUNTER — Encounter (HOSPITAL_BASED_OUTPATIENT_CLINIC_OR_DEPARTMENT_OTHER): Payer: Self-pay | Admitting: *Deleted

## 2019-10-08 ENCOUNTER — Emergency Department (HOSPITAL_BASED_OUTPATIENT_CLINIC_OR_DEPARTMENT_OTHER): Payer: Medicare Other

## 2019-10-08 ENCOUNTER — Other Ambulatory Visit: Payer: Self-pay

## 2019-10-08 DIAGNOSIS — Z888 Allergy status to other drugs, medicaments and biological substances status: Secondary | ICD-10-CM

## 2019-10-08 DIAGNOSIS — M5430 Sciatica, unspecified side: Secondary | ICD-10-CM | POA: Diagnosis not present

## 2019-10-08 DIAGNOSIS — K429 Umbilical hernia without obstruction or gangrene: Secondary | ICD-10-CM | POA: Diagnosis present

## 2019-10-08 DIAGNOSIS — Z20822 Contact with and (suspected) exposure to covid-19: Secondary | ICD-10-CM | POA: Diagnosis not present

## 2019-10-08 DIAGNOSIS — I4819 Other persistent atrial fibrillation: Secondary | ICD-10-CM | POA: Diagnosis not present

## 2019-10-08 DIAGNOSIS — E669 Obesity, unspecified: Secondary | ICD-10-CM | POA: Diagnosis not present

## 2019-10-08 DIAGNOSIS — K5792 Diverticulitis of intestine, part unspecified, without perforation or abscess without bleeding: Principal | ICD-10-CM | POA: Insufficient documentation

## 2019-10-08 DIAGNOSIS — I5032 Chronic diastolic (congestive) heart failure: Secondary | ICD-10-CM | POA: Diagnosis present

## 2019-10-08 DIAGNOSIS — I252 Old myocardial infarction: Secondary | ICD-10-CM

## 2019-10-08 DIAGNOSIS — I959 Hypotension, unspecified: Secondary | ICD-10-CM | POA: Diagnosis not present

## 2019-10-08 DIAGNOSIS — I2510 Atherosclerotic heart disease of native coronary artery without angina pectoris: Secondary | ICD-10-CM | POA: Diagnosis present

## 2019-10-08 DIAGNOSIS — F419 Anxiety disorder, unspecified: Secondary | ICD-10-CM | POA: Diagnosis not present

## 2019-10-08 DIAGNOSIS — K439 Ventral hernia without obstruction or gangrene: Secondary | ICD-10-CM | POA: Diagnosis present

## 2019-10-08 DIAGNOSIS — R109 Unspecified abdominal pain: Secondary | ICD-10-CM | POA: Diagnosis not present

## 2019-10-08 DIAGNOSIS — N179 Acute kidney failure, unspecified: Secondary | ICD-10-CM | POA: Diagnosis present

## 2019-10-08 DIAGNOSIS — I447 Left bundle-branch block, unspecified: Secondary | ICD-10-CM | POA: Diagnosis not present

## 2019-10-08 DIAGNOSIS — K5732 Diverticulitis of large intestine without perforation or abscess without bleeding: Secondary | ICD-10-CM | POA: Diagnosis not present

## 2019-10-08 DIAGNOSIS — G250 Essential tremor: Secondary | ICD-10-CM | POA: Diagnosis present

## 2019-10-08 DIAGNOSIS — I428 Other cardiomyopathies: Secondary | ICD-10-CM | POA: Diagnosis present

## 2019-10-08 DIAGNOSIS — Z96643 Presence of artificial hip joint, bilateral: Secondary | ICD-10-CM | POA: Diagnosis present

## 2019-10-08 DIAGNOSIS — K5730 Diverticulosis of large intestine without perforation or abscess without bleeding: Secondary | ICD-10-CM | POA: Diagnosis not present

## 2019-10-08 DIAGNOSIS — Z7984 Long term (current) use of oral hypoglycemic drugs: Secondary | ICD-10-CM

## 2019-10-08 DIAGNOSIS — Z6833 Body mass index (BMI) 33.0-33.9, adult: Secondary | ICD-10-CM

## 2019-10-08 DIAGNOSIS — G2581 Restless legs syndrome: Secondary | ICD-10-CM | POA: Diagnosis present

## 2019-10-08 DIAGNOSIS — E860 Dehydration: Secondary | ICD-10-CM | POA: Diagnosis present

## 2019-10-08 DIAGNOSIS — Z95810 Presence of automatic (implantable) cardiac defibrillator: Secondary | ICD-10-CM

## 2019-10-08 DIAGNOSIS — E785 Hyperlipidemia, unspecified: Secondary | ICD-10-CM | POA: Diagnosis not present

## 2019-10-08 DIAGNOSIS — E119 Type 2 diabetes mellitus without complications: Secondary | ICD-10-CM | POA: Diagnosis present

## 2019-10-08 DIAGNOSIS — Z79891 Long term (current) use of opiate analgesic: Secondary | ICD-10-CM

## 2019-10-08 DIAGNOSIS — Z87442 Personal history of urinary calculi: Secondary | ICD-10-CM

## 2019-10-08 DIAGNOSIS — G8929 Other chronic pain: Secondary | ICD-10-CM | POA: Diagnosis not present

## 2019-10-08 DIAGNOSIS — Z77090 Contact with and (suspected) exposure to asbestos: Secondary | ICD-10-CM | POA: Diagnosis present

## 2019-10-08 DIAGNOSIS — K589 Irritable bowel syndrome without diarrhea: Secondary | ICD-10-CM | POA: Diagnosis present

## 2019-10-08 DIAGNOSIS — K469 Unspecified abdominal hernia without obstruction or gangrene: Secondary | ICD-10-CM | POA: Diagnosis not present

## 2019-10-08 DIAGNOSIS — Z7901 Long term (current) use of anticoagulants: Secondary | ICD-10-CM

## 2019-10-08 DIAGNOSIS — I110 Hypertensive heart disease with heart failure: Secondary | ICD-10-CM | POA: Diagnosis not present

## 2019-10-08 DIAGNOSIS — I10 Essential (primary) hypertension: Secondary | ICD-10-CM | POA: Diagnosis present

## 2019-10-08 DIAGNOSIS — Z8249 Family history of ischemic heart disease and other diseases of the circulatory system: Secondary | ICD-10-CM

## 2019-10-08 DIAGNOSIS — Z87891 Personal history of nicotine dependence: Secondary | ICD-10-CM

## 2019-10-08 DIAGNOSIS — R69 Illness, unspecified: Secondary | ICD-10-CM

## 2019-10-08 DIAGNOSIS — M545 Low back pain: Secondary | ICD-10-CM | POA: Diagnosis present

## 2019-10-08 DIAGNOSIS — D6851 Activated protein C resistance: Secondary | ICD-10-CM | POA: Diagnosis present

## 2019-10-08 DIAGNOSIS — Z79890 Hormone replacement therapy (postmenopausal): Secondary | ICD-10-CM

## 2019-10-08 DIAGNOSIS — I9589 Other hypotension: Secondary | ICD-10-CM | POA: Diagnosis present

## 2019-10-08 DIAGNOSIS — K219 Gastro-esophageal reflux disease without esophagitis: Secondary | ICD-10-CM | POA: Diagnosis not present

## 2019-10-08 DIAGNOSIS — E291 Testicular hypofunction: Secondary | ICD-10-CM | POA: Diagnosis present

## 2019-10-08 DIAGNOSIS — Z79899 Other long term (current) drug therapy: Secondary | ICD-10-CM

## 2019-10-08 DIAGNOSIS — Z8547 Personal history of malignant neoplasm of testis: Secondary | ICD-10-CM

## 2019-10-08 LAB — URINALYSIS, ROUTINE W REFLEX MICROSCOPIC
Bilirubin Urine: NEGATIVE
Glucose, UA: >=500 mg/dL — AB
Hgb urine dipstick: NEGATIVE
Ketones, ur: NEGATIVE mg/dL
Leukocytes,Ua: NEGATIVE
Nitrite: NEGATIVE
Protein, ur: NEGATIVE mg/dL
Specific Gravity, Urine: 1.015 (ref 1.005–1.030)
pH: 7 (ref 5.0–8.0)

## 2019-10-08 LAB — CBC WITH DIFFERENTIAL/PLATELET
Abs Immature Granulocytes: 0.04 10*3/uL (ref 0.00–0.07)
Basophils Absolute: 0.1 10*3/uL (ref 0.0–0.1)
Basophils Relative: 1 %
Eosinophils Absolute: 0.1 10*3/uL (ref 0.0–0.5)
Eosinophils Relative: 2 %
HCT: 52.8 % — ABNORMAL HIGH (ref 39.0–52.0)
Hemoglobin: 16.6 g/dL (ref 13.0–17.0)
Immature Granulocytes: 1 %
Lymphocytes Relative: 18 %
Lymphs Abs: 1.4 10*3/uL (ref 0.7–4.0)
MCH: 29.6 pg (ref 26.0–34.0)
MCHC: 31.4 g/dL (ref 30.0–36.0)
MCV: 94.3 fL (ref 80.0–100.0)
Monocytes Absolute: 0.6 10*3/uL (ref 0.1–1.0)
Monocytes Relative: 7 %
Neutro Abs: 5.6 10*3/uL (ref 1.7–7.7)
Neutrophils Relative %: 71 %
Platelets: 231 10*3/uL (ref 150–400)
RBC: 5.6 MIL/uL (ref 4.22–5.81)
RDW: 14.5 % (ref 11.5–15.5)
WBC: 7.7 10*3/uL (ref 4.0–10.5)
nRBC: 0 % (ref 0.0–0.2)

## 2019-10-08 LAB — TROPONIN I (HIGH SENSITIVITY)
Troponin I (High Sensitivity): 6 ng/L (ref <18)
Troponin I (High Sensitivity): 5 ng/L (ref <18)

## 2019-10-08 LAB — COMPREHENSIVE METABOLIC PANEL
ALT: 19 U/L (ref 0–44)
AST: 20 U/L (ref 15–41)
Albumin: 3.8 g/dL (ref 3.5–5.0)
Alkaline Phosphatase: 62 U/L (ref 38–126)
Anion gap: 12 (ref 5–15)
BUN: 24 mg/dL — ABNORMAL HIGH (ref 8–23)
CO2: 22 mmol/L (ref 22–32)
Calcium: 8.5 mg/dL — ABNORMAL LOW (ref 8.9–10.3)
Chloride: 100 mmol/L (ref 98–111)
Creatinine, Ser: 1.64 mg/dL — ABNORMAL HIGH (ref 0.61–1.24)
GFR calc Af Amer: 47 mL/min — ABNORMAL LOW (ref >60)
GFR calc non Af Amer: 40 mL/min — ABNORMAL LOW (ref >60)
Glucose, Bld: 201 mg/dL — ABNORMAL HIGH (ref 70–99)
Potassium: 4.1 mmol/L (ref 3.5–5.1)
Sodium: 134 mmol/L — ABNORMAL LOW (ref 135–145)
Total Bilirubin: 1 mg/dL (ref 0.3–1.2)
Total Protein: 6.6 g/dL (ref 6.5–8.1)

## 2019-10-08 LAB — PROTIME-INR
INR: 1.1 (ref 0.8–1.2)
Prothrombin Time: 13.7 seconds (ref 11.4–15.2)

## 2019-10-08 LAB — LACTIC ACID, PLASMA
Lactic Acid, Venous: 2.3 mmol/L (ref 0.5–1.9)
Lactic Acid, Venous: 1.3 mmol/L (ref 0.5–1.9)

## 2019-10-08 LAB — LIPASE, BLOOD: Lipase: 22 U/L (ref 11–51)

## 2019-10-08 LAB — SARS CORONAVIRUS 2 BY RT PCR (HOSPITAL ORDER, PERFORMED IN ~~LOC~~ HOSPITAL LAB): SARS Coronavirus 2: NEGATIVE

## 2019-10-08 LAB — URINALYSIS, MICROSCOPIC (REFLEX): RBC / HPF: NONE SEEN RBC/hpf (ref 0–5)

## 2019-10-08 LAB — OCCULT BLOOD X 1 CARD TO LAB, STOOL: Fecal Occult Bld: NEGATIVE

## 2019-10-08 MED ORDER — CARVEDILOL 12.5 MG PO TABS
12.5000 mg | ORAL_TABLET | Freq: Two times a day (BID) | ORAL | Status: DC
  Administered 2019-10-09 – 2019-10-10 (×3): 12.5 mg via ORAL
  Filled 2019-10-08 (×3): qty 1

## 2019-10-08 MED ORDER — AMIODARONE HCL 100 MG PO TABS
100.0000 mg | ORAL_TABLET | Freq: Every day | ORAL | Status: DC
  Administered 2019-10-09 – 2019-10-10 (×2): 100 mg via ORAL
  Filled 2019-10-08 (×2): qty 1

## 2019-10-08 MED ORDER — METRONIDAZOLE IN NACL 5-0.79 MG/ML-% IV SOLN
500.0000 mg | Freq: Three times a day (TID) | INTRAVENOUS | Status: DC
  Administered 2019-10-08 – 2019-10-09 (×2): 500 mg via INTRAVENOUS
  Filled 2019-10-08 (×2): qty 100

## 2019-10-08 MED ORDER — SODIUM CHLORIDE 0.9 % IV BOLUS
1875.0000 mL | Freq: Once | INTRAVENOUS | Status: AC
  Administered 2019-10-08: 1875 mL via INTRAVENOUS

## 2019-10-08 MED ORDER — APIXABAN 5 MG PO TABS
5.0000 mg | ORAL_TABLET | Freq: Two times a day (BID) | ORAL | Status: DC
  Administered 2019-10-08 – 2019-10-10 (×4): 5 mg via ORAL
  Filled 2019-10-08 (×4): qty 1

## 2019-10-08 MED ORDER — SODIUM CHLORIDE 0.9 % IV BOLUS
1000.0000 mL | Freq: Once | INTRAVENOUS | Status: AC
  Administered 2019-10-08: 1000 mL via INTRAVENOUS

## 2019-10-08 MED ORDER — ROPINIROLE HCL 1 MG PO TABS: 0.5000 mg | ORAL_TABLET | Freq: Every day | ORAL | Status: DC

## 2019-10-08 MED ORDER — LEVOFLOXACIN IN D5W 750 MG/150ML IV SOLN
750.0000 mg | Freq: Once | INTRAVENOUS | Status: AC
  Administered 2019-10-08: 750 mg via INTRAVENOUS
  Filled 2019-10-08: qty 150

## 2019-10-08 MED ORDER — VITAMIN D 25 MCG (1000 UNIT) PO TABS
1000.0000 [IU] | ORAL_TABLET | Freq: Every day | ORAL | Status: DC
  Administered 2019-10-09 – 2019-10-10 (×2): 1000 [IU] via ORAL
  Filled 2019-10-08 (×2): qty 1

## 2019-10-08 MED ORDER — SODIUM CHLORIDE 0.9 % IV BOLUS (SEPSIS)
500.0000 mL | Freq: Once | INTRAVENOUS | Status: AC
  Administered 2019-10-08: 500 mL via INTRAVENOUS

## 2019-10-08 MED ORDER — ZOLPIDEM TARTRATE 5 MG PO TABS: 5.0000 mg | ORAL_TABLET | Freq: Every evening | ORAL | Status: DC | PRN

## 2019-10-08 MED ORDER — IOHEXOL 300 MG/ML  SOLN
100.0000 mL | Freq: Once | INTRAMUSCULAR | Status: AC | PRN
  Administered 2019-10-08: 80 mL via INTRAVENOUS

## 2019-10-08 MED ORDER — MORPHINE SULFATE (PF) 2 MG/ML IV SOLN
2.0000 mg | INTRAVENOUS | Status: DC | PRN
  Administered 2019-10-08 – 2019-10-10 (×5): 2 mg via INTRAVENOUS
  Filled 2019-10-08 (×5): qty 1

## 2019-10-08 MED ORDER — MELATONIN 3 MG PO TABS
9.0000 mg | ORAL_TABLET | Freq: Every day | ORAL | Status: DC
  Administered 2019-10-08 – 2019-10-09 (×2): 9 mg via ORAL
  Filled 2019-10-08 (×2): qty 3

## 2019-10-08 MED ORDER — LISINOPRIL 10 MG PO TABS
10.0000 mg | ORAL_TABLET | Freq: Every day | ORAL | Status: DC
  Administered 2019-10-09 – 2019-10-10 (×2): 10 mg via ORAL
  Filled 2019-10-08 (×2): qty 1

## 2019-10-08 MED ORDER — PANTOPRAZOLE SODIUM 40 MG PO TBEC
40.0000 mg | DELAYED_RELEASE_TABLET | Freq: Every day | ORAL | Status: DC
  Administered 2019-10-09 – 2019-10-10 (×2): 40 mg via ORAL
  Filled 2019-10-08: qty 1

## 2019-10-08 MED ORDER — ROPINIROLE HCL 1 MG PO TABS
1.5000 mg | ORAL_TABLET | Freq: Every day | ORAL | Status: DC
  Administered 2019-10-08 – 2019-10-09 (×2): 1.5 mg via ORAL
  Filled 2019-10-08 (×2): qty 2

## 2019-10-08 MED ORDER — MORPHINE SULFATE (PF) 4 MG/ML IV SOLN
4.0000 mg | Freq: Once | INTRAVENOUS | Status: AC
  Administered 2019-10-08: 4 mg via INTRAVENOUS
  Filled 2019-10-08: qty 1

## 2019-10-08 MED ORDER — GABAPENTIN 300 MG PO CAPS: 600.0000 mg | ORAL_CAPSULE | Freq: Every day | ORAL | Status: DC | PRN

## 2019-10-08 MED ORDER — METRONIDAZOLE IN NACL 5-0.79 MG/ML-% IV SOLN
500.0000 mg | Freq: Once | INTRAVENOUS | Status: AC
  Administered 2019-10-08: 500 mg via INTRAVENOUS
  Filled 2019-10-08: qty 100

## 2019-10-08 MED ORDER — SODIUM CHLORIDE 0.9 % IV BOLUS: 30.0000 mL/kg | Freq: Once | INTRAVENOUS | Status: DC

## 2019-10-08 MED ORDER — CIPROFLOXACIN IN D5W 400 MG/200ML IV SOLN: 400.0000 mg | Freq: Two times a day (BID) | INTRAVENOUS | Status: DC

## 2019-10-08 MED ORDER — SODIUM CHLORIDE 0.9 % IV SOLN
1000.0000 mL | INTRAVENOUS | Status: DC
  Administered 2019-10-08 – 2019-10-09 (×3): 1000 mL via INTRAVENOUS

## 2019-10-08 MED ORDER — SODIUM CHLORIDE 0.9 % IV SOLN
INTRAVENOUS | Status: DC | PRN
  Administered 2019-10-08: 250 mL via INTRAVENOUS

## 2019-10-08 MED ORDER — ONDANSETRON HCL 4 MG/2ML IJ SOLN
4.0000 mg | Freq: Four times a day (QID) | INTRAMUSCULAR | Status: DC | PRN
  Administered 2019-10-09: 4 mg via INTRAVENOUS
  Filled 2019-10-08: qty 2

## 2019-10-08 MED ORDER — ROPINIROLE HCL 1 MG PO TABS: 1.0000 mg | ORAL_TABLET | Freq: Every day | ORAL | Status: DC

## 2019-10-08 NOTE — ED Notes (Signed)
Patient transported to CT 

## 2019-10-08 NOTE — ED Notes (Signed)
CONT TO DENY ANY CHEST PAIN OR SOB

## 2019-10-08 NOTE — ED Notes (Signed)
Returns from CT scan

## 2019-10-08 NOTE — ED Notes (Signed)
LACTIC ACID AT 2.3, EDP NOTIFIED AND AWARE

## 2019-10-08 NOTE — ED Notes (Signed)
2d IV established and maintenance fluid initiated

## 2019-10-08 NOTE — ED Notes (Signed)
PT on monitor, RN Legrand Como informed of Pts low BP

## 2019-10-08 NOTE — ED Provider Notes (Addendum)
[facility] EMERGENCY DEPARTMENT Provider Note   CSN: [id number] Arrival date & time: 10/08/19  1250     History Chief Complaint  Patient presents with  . Abdominal Pain    Walter [name] is a 74 y.o. male.  HPI Reports that he was doing some yard work about 3 days ago.  He has been very careful and doing light raking.  He reports he did have a postoperative hematoma from March and was doing the activity fairly lightly.  He got a acute pain in his abdomen.  It did not feel like he had pulled or strained something.  He reports that since that time he has had a persistent and fairly severe mid right to lower abdominal pain.  Patient denies he has had any syncopal episodes.  He reports he has not felt lightheaded or like he would pass out until he got his IV started here in the emergency department.  He does take Eliquis for his heart.  He reports he is taken it since he got stents and procedures done 5 years ago.  He reports his stool has been somewhat dark.  No diarrhea or vomiting.  No fevers.    Past Medical History:  Diagnosis Date  . AICD (automatic cardioverter/defibrillator) present   . Anxiety   . Asbestosis ([place])    mild  . Atrial fibrillation ([place])   . Chronic low back pain    /sciatica- Dr Letta Median [facility])  . Complication of anesthesia 1996   previous surgery had a run of v-tach (for gallbladder-1990's)-cannot lie completely flat and panis when oxygen mask placed over face  . Coronary artery disease, non-occlusive June 2011   Cardiac cath in [facility] following abnormal Myoview  . Delirium 05/06/2019  . DJD (degenerative joint disease)    /osteoarthritis  . Dyslipidemia, goal LDL below 100    On lovastatin (myalgias with Zocor and Lipitor)  . Dyspnea    worse in hot weather  . Dysrhythmia    LBBB, in 2015-atrial fibrillation  . Essential hypertension   . Factor V [facility])    sees Dr. Cherrie Gauze Marrow- no problems with this  .  Frozen shoulder    right  . GERD (gastroesophageal reflux disease)   . H/O asbestosis    when in [employer]-1964-1970,1971-1986 in [employer]-diagnosed in 1990  . History of kidney stones   . Hypogonadism male   . Irritable bowel   . Kidney stone   . Left bundle branch block (LBBB) on electrocardiogram    Diagnosed close to 20 years ago  . Myocardial infarction ([place])   . Non-ischemic cardiomyopathy [facility]) June 2011   EF 45-50%; suspect related to LBBB; repeat Echo March 2015: EF 50- 55%  . Obesity   . PONV (postoperative nausea and vomiting)   . Restless leg syndrome   . Rotator cuff arthropathy of right shoulder 05/14/2017  . Testicular cancer ([place]) 1972   left  . Tremor, essential 12/09/2015  . Type 2 diabetes mellitus without complication ([place]) 03/6071   Type 2.     Patient Active Problem List   Diagnosis Date Noted  . Osteoarthritis of left hip 05/26/2019  . S/P total hip arthroplasty 05/26/2019  . Delirium 05/06/2019  . Rotator cuff arthropathy of right shoulder 05/14/2017  . Primary localized osteoarthrosis of shoulder 05/14/2017  . Tremor, essential 12/09/2015  . DJD (degenerative joint disease) 02/22/2015  . Primary osteoarthritis of right hip 02/22/2015  . Status post total  replacement of right hip 02/22/2015  . Paroxysmal atrial fibrillation ([place]) 02/17/2014  . Chronic systolic dysfunction of left ventricle 01/16/2014  . Left bundle branch block 01/16/2014  . Long term (current) use of anticoagulants 10/20/2013  . Esophageal spasm 10/15/2013  . Cardiomyopathy, nonischemic ([place]) 10/12/2013  . Abnormal cardiovascular function study 10/11/2013  . Persistent atrial fibrillation ([place]) 10/09/2013  . Nonischemic cardiomyopathy ([place]) 05/18/2013  . Essential hypertension, benign 05/12/2013  . Restless leg syndrome 05/12/2013  . Insomnia 05/12/2013  . Palpitations 05/12/2013  . Encounter for long-term (current) use of other medications 05/12/2013  . Non-occlusive coronary  artery disease 05/12/2013    Past Surgical History:  Procedure Laterality Date  . APPENDECTOMY N/A   . BI-VENTRICULAR IMPLANTABLE CARDIOVERTER DEFIBRILLATOR N/A 02/04/2014   SJM Quadra Assura BiV ICD implanted for primary prevention by [name]  . CARDIAC CATHETERIZATION  June 2011   Nonobstructive CAD  . CARDIAC DEFIBRILLATOR PLACEMENT  02/04/14   [facility] model [place] 3365-40Q (serial Number [id number]) biventricular ICD.  Marland Kitchen CARDIOVERSION N/A 10/13/2013   Procedure: CARDIOVERSION;  Surgeon: Larey Dresser, MD;  Location: [facility];  Service: Cardiovascular;  Laterality: N/A;  . CARDIOVERSION  10-16-2013   DCCV by [name] following intracardiac echo to rule out LAA thrombus  . CARDIOVERSION N/A 10/16/2013   Procedure: CARDIOVERSION;  Surgeon: Coralyn Mark, MD;  Location: [facility] CATH LAB;  Service: Cardiovascular;  Laterality: N/A;  . CHOLECYSTECTOMY OPEN    . ELECTROPHYSIOLOGY STUDY N/A 10/16/2013   Procedure: ELECTROPHYSIOLOGY STUDY;  Surgeon: Coralyn Mark, MD;  Location: [facility] CATH LAB;  Service: Cardiovascular;  Laterality: N/A;  . ESOPHAGOGASTRODUODENOSCOPY N/A 10/15/2013   Procedure: ESOPHAGOGASTRODUODENOSCOPY (EGD);  Surgeon: Gatha Mayer, MD;  Location: [facility] ENDOSCOPY;  Service: Endoscopy;  Laterality: N/A;  bedside  . HERNIA REPAIR    . KNEE ARTHROSCOPY Left   . LEFT AND RIGHT HEART CATHETERIZATION WITH CORONARY ANGIOGRAM N/A 10/12/2013   Procedure: LEFT AND RIGHT HEART CATHETERIZATION WITH CORONARY ANGIOGRAM;  Surgeon: Troy Sine, MD;  Location: [facility] CATH LAB;  Service: Cardiovascular;  Laterality: N/A;  . lymph nodes    . RADIOFREQUENCY ABLATION NERVES     Back every 6 months  . REVERSE SHOULDER ARTHROPLASTY Right 05/14/2017   Procedure: TOTAL REVERSE SHOULDER ARTHROPLASTY;  Surgeon: Marchia Bond, MD;  Location: [facility];  Service: Orthopedics;  Laterality: Right;  . ROTATOR CUFF REPAIR Right   . SURGERY SCROTAL / TESTICULAR Left   . TONSILLECTOMY Bilateral   .  TOTAL HIP ARTHROPLASTY Right 02/22/2015   Procedure: RIGHT TOTAL HIP ARTHROPLASTY ANTERIOR APPROACH;  Surgeon: Renette Butters, MD;  Location: [facility];  Service: Orthopedics;  Laterality: Right;  . TOTAL HIP ARTHROPLASTY Left 05/26/2019   Procedure: TOTAL HIP ARTHROPLASTY;  Surgeon: Marchia Bond, MD;  Location: [facility];  Service: Orthopedics;  Laterality: Left;  . TRANSTHORACIC ECHOCARDIOGRAM  May 2011   EF 45% with diffuse hypokinesis; septal bounce from LBBB  . TRANSTHORACIC ECHOCARDIOGRAM  March 2015   EF 50-55%. Mild concentric LVH. Septal dyssynergy from LBBB        Family History  Problem Relation Age of Onset  . Hypertension Mother   . Heart disease Mother   . Heart disease Father   . Hypertension Brother   . Heart attack Maternal Uncle   . Hypertension Brother   . Heart disease Paternal Uncle     Social History   Tobacco Use  . Smoking status: Former Smoker  Types: Cigarettes    Quit date: 02/10/1973    Years since quitting: 46.6  . Smokeless tobacco: Never Used  Vaping Use  . Vaping Use: Never used  Substance Use Topics  . Alcohol use: Yes    Comment: rare  . Drug use: No    Home Medications Prior to Admission medications   Medication Sig Start Date End Date Taking? Authorizing Provider  albuterol (PROVENTIL HFA;VENTOLIN HFA) 108 (90 Base) MCG/ACT inhaler Inhale 1-2 puffs into the lungs every 6 (six) hours as needed for wheezing or shortness of breath.    [provider]  ALTOPREV 40 MG 24 hr tablet Take 40 mg by mouth daily. 09/25/19   [provider]  amiodarone (PACERONE) 200 MG tablet Take 100 mg by mouth daily.    [provider]  apixaban (ELIQUIS) 5 MG TABS tablet Take 5 mg by mouth 2 (two) times daily.     [provider]  carvedilol (COREG) 12.5 MG tablet Take 12.5 mg by mouth 2 (two) times daily with a meal.    [provider]  cholecalciferol (VITAMIN D3) 25 MCG (1000 UNIT) tablet Take 1,000 Units by mouth  daily.    [provider]  empagliflozin (JARDIANCE) 25 MG TABS tablet Take 25 mg by mouth daily.    [provider]  esomeprazole (NEXIUM) 40 MG capsule Take 40 mg by mouth daily after breakfast.  11/19/18   [provider]  furosemide (LASIX) 20 MG tablet Take 1 tablet (20 mg total) by mouth every other day. 08/10/19   Larey Dresser, MD  gabapentin (NEURONTIN) 300 MG capsule Take 600 mg by mouth daily as needed (pain).     [provider]  lisinopril (ZESTRIL) 10 MG tablet Take 10 mg by mouth daily.     [provider]  metFORMIN (GLUCOPHAGE) 500 MG tablet Take 1,000 mg by mouth 2 (two) times daily with a meal.     [provider]  methocarbamol (ROBAXIN) 750 MG tablet Take 750 mg by mouth every 6 (six) hours as needed for muscle spasms.    [provider]  morphine (MSIR) 15 MG tablet Take 15 mg by mouth 4 (four) times daily. 10/05/19   [provider]  omega-3 acid ethyl esters (LOVAZA) 1 G capsule Take 1 g by mouth 2 (two) times daily.     [provider]  oxyCODONE-acetaminophen (PERCOCET) 10-325 MG tablet Take 1 tablet by mouth 5 (five) times daily as needed. 08/06/19   [provider]  rOPINIRole (REQUIP) 0.5 MG tablet Take 0.5 mg by mouth daily as needed (restless leg).    [provider]  rOPINIRole (REQUIP) 1 MG tablet Take 1 mg by mouth at bedtime.  11/28/15   [provider]  sennosides-docusate sodium (SENOKOT-S) 8.6-50 MG tablet Take 2 tablets by mouth daily. 05/26/19   Ventura Bruns, PA-C  sildenafil (VIAGRA) 100 MG tablet Take 100 mg by mouth daily as needed for erectile dysfunction.    [provider]  testosterone cypionate (DEPO-TESTOSTERONE) 200 MG/ML injection Inject 200 mg into the muscle See admin instructions. Inject 200 mg into the muscle every 10 days    [provider]  tiZANidine (ZANAFLEX) 4 MG capsule Take 4 mg by mouth 2 (two) times daily as needed  for muscle spasms.     [provider]  zolpidem (AMBIEN) 5 MG tablet Take 5 mg by mouth at bedtime. 09/27/19   [provider]  Allergies    Statins and Cortisone  Review of Systems   Review of Systems 10 systems reviewed and negative except as per HPI Physical Exam Updated Vital Signs BP 111/66 (BP Location: Right Arm)   Pulse 66   Temp 98.1 F (36.7 C) (Oral)   Resp 12   Ht 6' (1.829 m)   Wt (!) 112.5 kg   SpO2 97%   BMI 33.63 kg/m   Physical Exam Constitutional:      Comments: Alert with clear mental status.  Slightly pale in appearance.  No respiratory distress.  HENT:     Head: Normocephalic and atraumatic.  Eyes:     Extraocular Movements: Extraocular movements intact.  Cardiovascular:     Rate and Rhythm: Normal rate and regular rhythm.  Pulmonary:     Effort: Pulmonary effort is normal.     Breath sounds: Normal breath sounds.  Abdominal:     Comments: Moderate to severe tenderness right mid to lower abdomen.  No guarding.  Genitourinary:    Comments: Stool is brown.  No melena no blood. Musculoskeletal:        General: Normal range of motion.     Right lower leg: No edema.     Left lower leg: No edema.  Skin:    General: Skin is warm and dry.     Coloration: Skin is pale.  Neurological:     General: No focal deficit present.     Mental Status: He is oriented to person, place, and time.     Coordination: Coordination normal.  Psychiatric:        Mood and Affect: Mood normal.     ED Results / Procedures / Treatments   Labs (all labs ordered are listed, but only abnormal results are displayed) Labs Reviewed  COMPREHENSIVE METABOLIC PANEL - Abnormal; Notable for the following components:      Result Value   Sodium 134 (*)    Glucose, Bld 201 (*)    BUN 24 (*)    Creatinine, Ser 1.64 (*)    Calcium 8.5 (*)    GFR calc non Af Amer 40 (*)    GFR calc Af Amer 47 (*)    All other components within normal limits  LACTIC ACID,  PLASMA - Abnormal; Notable for the following components:   Lactic Acid, Venous 2.3 (*)    All other components within normal limits  CBC WITH DIFFERENTIAL/PLATELET - Abnormal; Notable for the following components:   HCT 52.8 (*)    All other components within normal limits  SARS CORONAVIRUS 2 BY RT PCR (HOSPITAL ORDER, [place] LAB)  LIPASE, BLOOD  PROTIME-INR  OCCULT BLOOD X 1 CARD TO LAB, STOOL  LACTIC ACID, PLASMA  URINALYSIS, ROUTINE W REFLEX MICROSCOPIC  TROPONIN I (HIGH SENSITIVITY)  TROPONIN I (HIGH SENSITIVITY)    EKG EKG Interpretation  Date/Time:  Thursday October 08 2019 13:32:22 EDT Ventricular Rate:  78 PR Interval:    QRS Duration: 119 QT Interval:  419 QTC Calculation: 478 R Axis:   -59 Text Interpretation: Sinus rhythm Left anterior fascicular block Anterior Q waves, possibly due to LVH similar to old, no acute ischemic change Confirmed by Charlesetta Shanks [phone]) on 10/08/2019 1:46:56 PM   Radiology CT Abdomen Pelvis W Contrast  Result Date: 10/08/2019 CLINICAL DATA:  Diverticulitis suspected EXAM: CT ABDOMEN AND PELVIS WITH CONTRAST TECHNIQUE: Multidetector CT imaging of the abdomen and pelvis was performed using the standard protocol following bolus administration of  intravenous contrast. CONTRAST:  32mL OMNIPAQUE IOHEXOL 300 MG/ML  SOLN COMPARISON:  CT 02/10/2019 FINDINGS: Lower chest: Calcified basilar pleural plaque is similar to prior. Lung bases otherwise clear. Normal cardiac size. No pericardial effusion. Pacer leads seen at the cardiac apex and right atrium. Calcification of the native coronary arteries. Hepatobiliary: No worrisome focal liver lesions. Smooth liver surface contour. Normal hepatic attenuation. Post cholecystectomy. No significant biliary ductal dilatation or visible calcified gallstones. Pancreas: Mild pancreatic atrophy without focal inflammation, ductal dilatation or discernible lesion. Spleen: Normal in size. No  concerning splenic lesions. Adrenals/Urinary Tract: Normal adrenal glands. Kidneys enhance and excrete symmetrically. Stable mild-to-moderate bilateral symmetric perinephric stranding, a nonspecific finding though may correlate with either age or decreased renal function. No worrisome renal lesion. No urolithiasis or hydronephrosis. Urinary bladder partially obscured by streak artifact. Urinary bladder is largely decompressed at the time of exam and therefore poorly evaluated by CT imaging. Within these limitations, no gross bladder abnormality. Stomach/Bowel: Distal esophagus, stomach and duodenum are unremarkable. Two loops of small bowel again seen protruding into a small ventral midline hernia superior to the umbilicus (2/45) without resulting obstruction or vascular compromise. No small bowel thickening or dilatation. No colonic dilatation or wall thickening. Scattered colonic diverticula most pronounced in the distal colon with Pap some mild hazy adjacent stranding (2/75, 5/46) which is minimally increased from prior but could reflect an early or developing diverticulitis. Vascular/Lymphatic: Atherosclerotic calcifications within the abdominal aorta and branch vessels. No aneurysm or ectasia. No enlarged abdominopelvic lymph nodes. Reproductive: Largely obscured by streak artifact. Other: Ventral midline hernias, as above, unchanged in appearance from prior. Fat containing right inguinal hernia. No abdominopelvic free air or fluid. No collection or abscess. Musculoskeletal: Mild scoliotic curvature of the spine similar to prior. 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 No acute osseous abnormality or suspicious osseous lesion. Multilevel degenerative changes are present in the imaged portions of the spine. Bilateral iliopsoas bursal fluid anterior to the hips and partially obscured by streak. Asymmetric atrophy of the right psoas muscle belly. IMPRESSION: 1. Colonic  diverticulosis most pronounced in the distal colon with minimal hazy adjacent stranding centered upon a diverticulum in the distal sigmoid which is minimally increased from prior but could reflect an early or developing diverticulitis. No evidence of perforation or abscess formation. 2. Two loops of small bowel again seen protruding into a small ventral midline hernia superior to the umbilicus without resulting obstruction or vascular compromise. 3. Bilateral iliopsoas bursal fluid anterior to the hips and partially obscured by streak artifact. Chronic atrophy of the right psoas. 4. 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 5. Calcified pleural plaques may reflect prior asbestos exposure related changes. 6. Aortic Atherosclerosis (ICD10-I70.0). Electronically Signed   By: Lovena Le M.D.   On: 10/08/2019 15:07    Procedures Procedures (including critical care time) CRITICAL CARE Performed by: Charlesetta Shanks   Total critical care time: 30 minutes  Critical care time was exclusive of separately billable procedures and treating other patients.  Critical care was necessary to treat or prevent imminent or life-threatening deterioration.  Critical care was time spent personally by me on the following activities: development of treatment plan with patient and/or surrogate as well as nursing, discussions with consultants, evaluation of patient's response to treatment, examination of patient, obtaining history from patient or surrogate, ordering and performing treatments and interventions, ordering and review of laboratory studies, ordering and review of radiographic studies, pulse oximetry and re-evaluation of patient's  condition. Medications Ordered in ED Medications  sodium chloride 0.9 % bolus 500 mL (0 mLs Intravenous Stopped 10/08/19 1419)    Followed by  0.9 %  sodium chloride infusion (1,000 mLs Intravenous New Bag/Given 10/08/19 1411)  levofloxacin  (LEVAQUIN) IVPB 750 mg (has no administration in time range)  metroNIDAZOLE (FLAGYL) IVPB 500 mg (has no administration in time range)  sodium chloride 0.9 % bolus 3,375 mL (has no administration in time range)  sodium chloride 0.9 % bolus 1,000 mL (1,000 mLs Intravenous New Bag/Given 10/08/19 1445)  iohexol (OMNIPAQUE) 300 MG/ML solution 100 mL (80 mLs Intravenous Contrast Given 10/08/19 1435)    ED Course  I have reviewed the triage vital signs and the nursing notes.  Pertinent labs & imaging results that were available during my care of the patient were reviewed by me and considered in my medical decision making (see chart for details).    MDM Rules/Calculators/A&P                           Consult: Dr. Ronalee Belts for admission. Patient presents with right mid abdominal pain.  Fairly severe in nature.  Patient is hypotensive.  He has had 3 days of pain.  CT shows some early diverticulitis however with degree of pain and hypotension with lactic acidosis, patient will require admission for antibiotics and hydration.  Mental status and respiratory status are normal.  Patient is chronically anticoagulated on Eliquis.  Stool is heme-negative.  No evidence of any active bleeding sources.  Final Clinical Impression(s) / ED Diagnoses Final diagnoses:  Diverticulitis  Other specified hypotension  Dehydration  Severe comorbid illness    Rx / DC Orders ED Discharge Orders    None       Charlesetta Shanks, MD 10/08/19 1523    Charlesetta Shanks, MD 10/08/19 1556

## 2019-10-08 NOTE — Care Plan (Addendum)
[facility] transfer request Dr. Johnney Killian  73yom multiple medical problems presented with abd pain, SBP 70s, AKI.  PMH afib DM type 2  Exam per EDP notable for mid/RLQ moderate/severe apin Looks better after hydration Otherwise benign No fever, no tachypnea, no hypoxia, normal HR SBP 115 on last check and 111 30 minutes prior but was yo-80s for ~1 hour  Got 3+ liter bolus, Levaquin, Flagyl  Cr up 1.64, baseline 1.13 Normal CBC Lactic acid modestly elevated 2.3 EKG nonacute CT--early diverticulitis distal sigmoid  A/P Given SBP 70s on arrival and elevated lactate will obs to progressive. Does not meet SIRS criteria. --continue abx for acute diverticulitis --serial abd exam  Murray Hodgkins, MD [facility]  No charge note

## 2019-10-08 NOTE — ED Triage Notes (Signed)
Abdominal pain for 3 weeks but worse x 3 days. States he feels he has a hernia.

## 2019-10-08 NOTE — ED Notes (Signed)
Occult Stool card obtained and to the lab

## 2019-10-08 NOTE — ED Notes (Signed)
518ml fluid bolus initiated per EDP verbal orders

## 2019-10-08 NOTE — Progress Notes (Signed)
Patient admitted from [facility] to room 1439. Patient alert and oriented, ambulatory in room. No complain of pain or discomfort at this time. RN provided education on use of call light and phone. Patient's wife currently in room. Will continue to monitor

## 2019-10-08 NOTE — ED Notes (Signed)
COVID SWAB OBTAINED 

## 2019-10-08 NOTE — ED Notes (Signed)
Presents with abd pain, noted NBP at 84/62, immediately placed on cont cardiac monitoring, with 12 lead ECG obtained, EDP in room for immediate evaluation, denies feeling short of breath, pain is more at RLQ, denies noting any blood in stool or having bloody emesis. Orders rec for IV Fluids and blood test.

## 2019-10-09 DIAGNOSIS — K5792 Diverticulitis of intestine, part unspecified, without perforation or abscess without bleeding: Secondary | ICD-10-CM

## 2019-10-09 DIAGNOSIS — I4819 Other persistent atrial fibrillation: Secondary | ICD-10-CM

## 2019-10-09 LAB — GLUCOSE, CAPILLARY
Glucose-Capillary: 112 mg/dL — ABNORMAL HIGH (ref 70–99)
Glucose-Capillary: 115 mg/dL — ABNORMAL HIGH (ref 70–99)
Glucose-Capillary: 116 mg/dL — ABNORMAL HIGH (ref 70–99)
Glucose-Capillary: 118 mg/dL — ABNORMAL HIGH (ref 70–99)
Glucose-Capillary: 121 mg/dL — ABNORMAL HIGH (ref 70–99)
Glucose-Capillary: 137 mg/dL — ABNORMAL HIGH (ref 70–99)
Glucose-Capillary: 98 mg/dL (ref 70–99)

## 2019-10-09 LAB — CBC WITH DIFFERENTIAL/PLATELET
Abs Immature Granulocytes: 0.03 10*3/uL (ref 0.00–0.07)
Basophils Absolute: 0 10*3/uL (ref 0.0–0.1)
Basophils Relative: 1 %
Eosinophils Absolute: 0.1 10*3/uL (ref 0.0–0.5)
Eosinophils Relative: 2 %
HCT: 50.1 % (ref 39.0–52.0)
Hemoglobin: 15.4 g/dL (ref 13.0–17.0)
Immature Granulocytes: 1 %
Lymphocytes Relative: 23 %
Lymphs Abs: 1.5 10*3/uL (ref 0.7–4.0)
MCH: 29.9 pg (ref 26.0–34.0)
MCHC: 30.7 g/dL (ref 30.0–36.0)
MCV: 97.3 fL (ref 80.0–100.0)
Monocytes Absolute: 0.7 10*3/uL (ref 0.1–1.0)
Monocytes Relative: 10 %
Neutro Abs: 4.2 10*3/uL (ref 1.7–7.7)
Neutrophils Relative %: 63 %
Platelets: 207 10*3/uL (ref 150–400)
RBC: 5.15 MIL/uL (ref 4.22–5.81)
RDW: 14.6 % (ref 11.5–15.5)
WBC: 6.5 10*3/uL (ref 4.0–10.5)
nRBC: 0 % (ref 0.0–0.2)

## 2019-10-09 LAB — BASIC METABOLIC PANEL
Anion gap: 8 (ref 5–15)
BUN: 19 mg/dL (ref 8–23)
CO2: 24 mmol/L (ref 22–32)
Calcium: 8.3 mg/dL — ABNORMAL LOW (ref 8.9–10.3)
Chloride: 105 mmol/L (ref 98–111)
Creatinine, Ser: 1.24 mg/dL (ref 0.61–1.24)
GFR calc Af Amer: >60 mL/min (ref >60)
GFR calc non Af Amer: 57 mL/min — ABNORMAL LOW (ref >60)
Glucose, Bld: 109 mg/dL — ABNORMAL HIGH (ref 70–99)
Potassium: 4.1 mmol/L (ref 3.5–5.1)
Sodium: 137 mmol/L (ref 135–145)

## 2019-10-09 LAB — HEMOGLOBIN A1C
Hgb A1c MFr Bld: 6.9 % — ABNORMAL HIGH (ref 4.8–5.6)
Mean Plasma Glucose: 151.33 mg/dL

## 2019-10-09 LAB — MAGNESIUM: Magnesium: 2 mg/dL (ref 1.7–2.4)

## 2019-10-09 MED ORDER — PIPERACILLIN-TAZOBACTAM 3.375 G IVPB
3.3750 g | Freq: Three times a day (TID) | INTRAVENOUS | Status: DC
  Administered 2019-10-09 – 2019-10-10 (×3): 3.375 g via INTRAVENOUS
  Filled 2019-10-09 (×3): qty 50

## 2019-10-09 MED ORDER — BOOST / RESOURCE BREEZE PO LIQD CUSTOM
1.0000 | Freq: Three times a day (TID) | ORAL | Status: DC
  Administered 2019-10-09: 1 via ORAL

## 2019-10-09 MED ORDER — ADULT MULTIVITAMIN W/MINERALS CH
1.0000 | ORAL_TABLET | Freq: Every day | ORAL | Status: DC
  Administered 2019-10-09 – 2019-10-10 (×2): 1 via ORAL
  Filled 2019-10-09 (×2): qty 1

## 2019-10-09 MED ORDER — SODIUM CHLORIDE 0.45 % IV SOLN: INTRAVENOUS | Status: DC

## 2019-10-09 MED ORDER — MORPHINE SULFATE 15 MG PO TABS
15.0000 mg | ORAL_TABLET | Freq: Four times a day (QID) | ORAL | Status: DC
  Administered 2019-10-09 – 2019-10-10 (×4): 15 mg via ORAL
  Filled 2019-10-09 (×4): qty 1

## 2019-10-09 MED ORDER — INSULIN ASPART 100 UNIT/ML ~~LOC~~ SOLN
0.0000 [IU] | SUBCUTANEOUS | Status: DC
  Administered 2019-10-09 (×2): 2 [IU] via SUBCUTANEOUS

## 2019-10-09 NOTE — Hospital Course (Signed)
Walter [name] is a 74 yo CM with PMH diverticulosis, DMII, CAD, obesity, GERD, HTN, PAF, anxiety, HFpEF (Gr 1 DD, s/p ICD, EF 55% on 07/2019 echo) who presented to [facility] with worsening abdominal pain over the past 3 to 4 days.  He initially thought he had strained himself while doing yard work but as the pain did not improve, he presented for further evaluation.  He also endorses having a known hernia in his abdomen which was repaired years ago but he thinks has recurred.  Initially he had thought the hernia was "strangulated" however he has been passing gas and having bowel movements.  He does point to some of his pain over the hernia area as well as to his left abdomen. At home, he endorsed nausea but no vomiting.  Denied any fevers, chills, sweats.  He underwent CT abdomen/pelvis which revealed subtle stranding in a diverticulum in the distal sigmoid colon concerning for early developing diverticulitis.  No signs of perforation or abscess. 2 loops of small bowel were also seen consistent with his umbilical hernia.  There were no signs of obstruction or vascular compromise. He was treated with Levaquin and Flagyl as well as morphine for pain control which he states helped.  He was then transferred to [facility] for further treatment.  He does endorse that he has an upcoming trip to [place] and is supposed to leave this coming Tuesday.  He states he is able to cancel or push back the trip some if needed however is hoping to know approximately when he might get to go home or if his hospitalization will be prolonged.  Stated that we would need to see his clinical response in the next 24 hours and could then further have discussions regarding this.  He was understanding and in agreement.

## 2019-10-09 NOTE — Assessment & Plan Note (Signed)
-  Continue aspirin and Plavix for now.  No obvious surgical need at this time.  He is also on anticoagulation with Eliquis for his A. fib

## 2019-10-09 NOTE — Progress Notes (Signed)
PROGRESS NOTE  Walter [name]  OZH:[id number] DOB: 07-May-1945 DOA: 10/08/2019 PCP: London Pepper, MD  Outpatient Specialists: GI, Walter [name] Brief Narrative: Walter [name] is a 74 yo male with a history of T2DM, CAD, obesity (BMI 33), GERD, HTN, PAF, anxiety, HFpEF (Gr 1 DD, s/p ICD, EF 55% on 07/2019 echo), chronic pain on chronic narcotics per pain management, who presented 7/22 to [facility] with worsening lower abdominal pain. It began as lower back pain, later radiating toward the lower abdomen. He felt it may be due to a hernia which had been treated with mesh previously. He was hypotensive with AKI and lower abdominal tenderness. CT showed ventral hernia with small bowel loops and no evidence of strangulation or inflammation, though there appeared to be inflammatory changes around sigmoid diverticulum consistent with early diverticulitis. 3L IV fluids and levaquin, flagyl were given with improvement in blood pressure and admission was requested.   Assessment & Plan: Active Problems:   Essential hypertension, benign   Non-occlusive coronary artery disease   Persistent atrial fibrillation (HCC)   Acute diverticulitis  Acute diverticulitis: First episode. Exam is benign, no fever or leukocytosis. Had "normal colonoscopy remotely and last colon CA screening was cologuard felt to be low risk per Walter [name].  - Advance diet today as tolerated. Avoid IV narcotics if at all possible.  - Continue antibiotics, can transition to zosyn with plans to convert to augmentin to avoid fluoroquinolone. - Discussed need for follow up colonoscopy after convalescence  AKI: Significantly improved with IVF.  - Avoid nephrotoxins, DC FQ as above. Monitor in AM on IVF.   Chronic HFpEF: Appears euvolemic.  - Decrease rate IVF - Continue home medications  PAF:  - Continue amiodarone, coreg, eliquis  T2DM:  - SSI  HTN:  - Continue lisinopril since AKI resolved.  Chronic pain:  - Restart home MSIR 15mg  QID.  Confirmed on PDMP this was prescribed #120 for 30-day supply on 7/19 by Walter Sarah, PA who has been prescribing oxycodone previously.   DVT prophylaxis: Eliquis Code Status: Full Family Communication: None at bedside. Pt to update family.  Disposition Plan:  Status is: Inpatient  Remains inpatient appropriate because:IV treatments appropriate due to intensity of illness or inability to take PO   Dispo: The patient is from: Home              Anticipated d/c is to: Home              Anticipated d/c date is: 1 day if able to tolerate diet and no longer requiring IV fluids for hydration or IV pain medications.              Patient currently is not medically stable to d/c.  Consultants:   None  Procedures:   None  Antimicrobials:  Levaquin, flagyl > cipro, flagyl > zosyn   Subjective: Abdominal pain is improved. Some nausea with clears, but mild. No vomiting. Eager to discharge. No fevers, chills, chest pain or dyspnea.   Objective: Vitals:   10/08/19 2126 10/09/19 0206 10/09/19 0547 10/09/19 1256  BP: (!) 121/92 (!) 152/99 (!) 134/78 (!) 138/82  Pulse: 72 69 77 72  Resp: 20 20 20 16   Temp: 98.8 F (37.1 C) 98.6 F (37 C) 98.5 F (36.9 C) 97.9 F (36.6 C)  TempSrc: Oral Oral Oral Oral  SpO2: 95% 96% 95% 96%  Weight:      Height:        Intake/Output Summary (Last 24 hours) at  10/09/2019 1420 Last data filed at 10/09/2019 1256 Gross per 24 hour  Intake 4126.92 ml  Output 1675 ml  Net 2451.92 ml   Filed Weights   10/08/19 1317  Weight: (!) 112.5 kg    Gen: 74 y.o. male in no distress  Pulm: Non-labored breathing room air. Clear to auscultation bilaterally.  CV: Regular rate and rhythm. No murmur, rub, or gallop. No JVD, no pedal edema. GI: Abdomen soft, mildly tender with reducible ventral hernia, not significant tender, non-distended, with normoactive bowel sounds. No organomegaly or masses felt. Ext: Warm, no deformities Skin: No rashes, lesions or  ulcers Neuro: Alert and oriented. No focal neurological deficits. Psych: Judgement and insight appear normal. Mood & affect appropriate.   Data Reviewed: I have personally reviewed following labs and imaging studies  CBC: Recent Labs  Lab 10/08/19 1348 10/09/19 0115  WBC 7.7 6.5  NEUTROABS 5.6 4.2  HGB 16.6 15.4  HCT 52.8* 50.1  MCV 94.3 97.3  PLT 231 353   Basic Metabolic Panel: Recent Labs  Lab 10/08/19 1348 10/09/19 0115  NA 134* 137  K 4.1 4.1  CL 100 105  CO2 22 24  GLUCOSE 201* 109*  BUN 24* 19  CREATININE 1.64* 1.24  CALCIUM 8.5* 8.3*  MG  --  2.0   GFR: Estimated Creatinine Clearance: 66.7 mL/min (by C-G formula based on SCr of 1.24 mg/dL). Liver Function Tests: Recent Labs  Lab 10/08/19 1348  AST 20  ALT 19  ALKPHOS 62  BILITOT 1.0  PROT 6.6  ALBUMIN 3.8   Recent Labs  Lab 10/08/19 1348  LIPASE 22   No results for input(s): AMMONIA in the last 168 hours. Coagulation Profile: Recent Labs  Lab 10/08/19 1348  INR 1.1   Cardiac Enzymes: No results for input(s): CKTOTAL, CKMB, CKMBINDEX, TROPONINI in the last 168 hours. BNP (last 3 results) No results for input(s): PROBNP in the last 8760 hours. HbA1C: Recent Labs    10/09/19 0115  HGBA1C 6.9*   CBG: Recent Labs  Lab 10/09/19 0115 10/09/19 0408 10/09/19 0751 10/09/19 1248  GLUCAP 98 115* 121* 112*   Lipid Profile: No results for input(s): CHOL, HDL, LDLCALC, TRIG, CHOLHDL, LDLDIRECT in the last 72 hours. Thyroid Function Tests: No results for input(s): TSH, T4TOTAL, FREET4, T3FREE, THYROIDAB in the last 72 hours. Anemia Panel: No results for input(s): VITAMINB12, FOLATE, FERRITIN, TIBC, IRON, RETICCTPCT in the last 72 hours. Urine analysis:    Component Value Date/Time   COLORURINE YELLOW 10/08/2019 1348   APPEARANCEUR CLEAR 10/08/2019 1348   LABSPEC 1.015 10/08/2019 1348   PHURINE 7.0 10/08/2019 1348   GLUCOSEU >=500 (A) 10/08/2019 1348   HGBUR NEGATIVE 10/08/2019 1348    BILIRUBINUR NEGATIVE 10/08/2019 1348   KETONESUR NEGATIVE 10/08/2019 1348   PROTEINUR NEGATIVE 10/08/2019 1348   UROBILINOGEN 1.0 11/14/2013 1522   NITRITE NEGATIVE 10/08/2019 1348   LEUKOCYTESUR NEGATIVE 10/08/2019 1348   Recent Results (from the past 240 hour(s))  SARS Coronavirus 2 by RT PCR (hospital order, performed in [facility] hospital lab) Nasopharyngeal Nasopharyngeal Swab     Status: None   Collection Time: 10/08/19  1:48 PM   Specimen: Nasopharyngeal Swab  Result Value Ref Range Status   SARS Coronavirus 2 NEGATIVE NEGATIVE Final    Comment: (NOTE) SARS-CoV-2 target nucleic acids are NOT DETECTED.  The SARS-CoV-2 RNA is generally detectable in upper and lower respiratory specimens during the acute phase of infection. The lowest concentration of SARS-CoV-2 viral copies this assay can detect  is 250 copies / mL. A negative result does not preclude SARS-CoV-2 infection and should not be used as the sole basis for treatment or other patient management decisions.  A negative result may occur with improper specimen collection / handling, submission of specimen other than nasopharyngeal swab, presence of viral mutation(s) within the areas targeted by this assay, and inadequate number of viral copies (<250 copies / mL). A negative result must be combined with clinical observations, patient history, and epidemiological information.  Fact Sheet for Patients:   StrictlyIdeas.no  Fact Sheet for Healthcare Providers: BankingDealers.co.za  This test is not yet approved or  cleared by the [place] FDA and has been authorized for detection and/or diagnosis of SARS-CoV-2 by FDA under an Emergency Use Authorization (EUA).  This EUA will remain in effect (meaning this test can be used) for the duration of the COVID-19 declaration under Section 564(b)(1) of the Act, 21 U.S.C. section 360bbb-3(b)(1), unless the authorization is  terminated or revoked sooner.  Performed at [facility], [address] [place], [place] [address]       Radiology Studies: CT Abdomen Pelvis W Contrast  Result Date: 10/08/2019 CLINICAL DATA:  Diverticulitis suspected EXAM: CT ABDOMEN AND PELVIS WITH CONTRAST TECHNIQUE: Multidetector CT imaging of the abdomen and pelvis was performed using the standard protocol following bolus administration of intravenous contrast. CONTRAST:  78mL OMNIPAQUE IOHEXOL 300 MG/ML  SOLN COMPARISON:  CT 02/10/2019 FINDINGS: Lower chest: Calcified basilar pleural plaque is similar to prior. Lung bases otherwise clear. Normal cardiac size. No pericardial effusion. Pacer leads seen at the cardiac apex and right atrium. Calcification of the native coronary arteries. Hepatobiliary: No worrisome focal liver lesions. Smooth liver surface contour. Normal hepatic attenuation. Post cholecystectomy. No significant biliary ductal dilatation or visible calcified gallstones. Pancreas: Mild pancreatic atrophy without focal inflammation, ductal dilatation or discernible lesion. Spleen: Normal in size. No concerning splenic lesions. Adrenals/Urinary Tract: Normal adrenal glands. Kidneys enhance and excrete symmetrically. Stable mild-to-moderate bilateral symmetric perinephric stranding, a nonspecific finding though may correlate with either age or decreased renal function. No worrisome renal lesion. No urolithiasis or hydronephrosis. Urinary bladder partially obscured by streak artifact. Urinary bladder is largely decompressed at the time of exam and therefore poorly evaluated by CT imaging. Within these limitations, no gross bladder abnormality. Stomach/Bowel: Distal esophagus, stomach and duodenum are unremarkable. Two loops of small bowel again seen protruding into a small ventral midline hernia superior to the umbilicus (8/54) without resulting obstruction or vascular compromise. No small bowel thickening or dilatation. No  colonic dilatation or wall thickening. Scattered colonic diverticula most pronounced in the distal colon with Pap some mild hazy adjacent stranding (2/75, 5/46) which is minimally increased from prior but could reflect an early or developing diverticulitis. Vascular/Lymphatic: Atherosclerotic calcifications within the abdominal aorta and branch vessels. No aneurysm or ectasia. No enlarged abdominopelvic lymph nodes. Reproductive: Largely obscured by streak artifact. Other: Ventral midline hernias, as above, unchanged in appearance from prior. Fat containing right inguinal hernia. No abdominopelvic free air or fluid. No collection or abscess. Musculoskeletal: Mild scoliotic curvature of the spine similar to prior. 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 No acute osseous abnormality or suspicious osseous lesion. Multilevel degenerative changes are present in the imaged portions of the spine. Bilateral iliopsoas bursal fluid anterior to the hips and partially obscured by streak. Asymmetric atrophy of the right psoas muscle belly. IMPRESSION: 1. Colonic diverticulosis most pronounced in the distal colon with minimal hazy adjacent  stranding centered upon a diverticulum in the distal sigmoid which is minimally increased from prior but could reflect an early or developing diverticulitis. No evidence of perforation or abscess formation. 2. Two loops of small bowel again seen protruding into a small ventral midline hernia superior to the umbilicus without resulting obstruction or vascular compromise. 3. Bilateral iliopsoas bursal fluid anterior to the hips and partially obscured by streak artifact. Chronic atrophy of the right psoas. 4. 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 5. Calcified pleural plaques may reflect prior asbestos exposure related changes. 6. Aortic Atherosclerosis (ICD10-I70.0). Electronically Signed   By: Lovena Le M.D.    On: 10/08/2019 15:07    Scheduled Meds: . amiodarone  100 mg Oral Daily  . apixaban  5 mg Oral BID  . carvedilol  12.5 mg Oral BID WC  . cholecalciferol  1,000 Units Oral Daily  . feeding supplement  1 Container Oral TID BM  . insulin aspart  0-15 Units Subcutaneous Q4H  . lisinopril  10 mg Oral Daily  . melatonin  9 mg Oral QHS  . morphine  15 mg Oral QID  . multivitamin with minerals  1 tablet Oral Daily  . pantoprazole  40 mg Oral Daily  . rOPINIRole  1.5 mg Oral QHS   Continuous Infusions: . sodium chloride 1,000 mL (10/09/19 0822)  . sodium chloride Stopped (10/08/19 1727)  . ciprofloxacin    . metronidazole 500 mg (10/09/19 0823)     LOS: 1 day   Time spent: 25 minutes.  Patrecia Pour, MD [facility] [email or web address] 10/09/2019, 2:20 PM

## 2019-10-09 NOTE — Assessment & Plan Note (Signed)
-  Continue Coreg and lisinopril.  Will need to hold if blood pressure downtrends

## 2019-10-09 NOTE — Progress Notes (Signed)
Initial Nutrition Assessment  DOCUMENTATION CODES:   Obesity unspecified  INTERVENTION:  Boost Breeze po TID, each supplement provides 250 kcal and 9 grams of protein MVI with minerals Advance diet as medically feasible   NUTRITION DIAGNOSIS:   Increased nutrient needs related to acute illness (acute diverticulitis) as evidenced by estimated needs.    GOAL:   Patient will meet greater than or equal to 90% of their needs    MONITOR:   Labs, I & O's, Diet advancement, Supplement acceptance, PO intake, Weight trends  REASON FOR ASSESSMENT:   Malnutrition Screening Tool    ASSESSMENT:  RD working remotely.  74 year old male admitted for acute diverticulitis after presenting with worsening abdominal pain over the past 3-4 days. Past medical history of DM2, CAD, obesity, GERD, HTN, PAF, anxiety, and HFpEF (EF 55%)  Diet advanced to full liquid at 1158, no documented intakes at this time. Able to speak with patient via phone, he reports that he had just ordered lunch recalls ice cream and chicken broth. Patient endorses poor appetite and po intake over the past couple of days, says usually his appetite is too good at home. He recalls typically eating 2 meals/day with snacks. Patient reports that his downfall is fluid intake, says he drinks a lot of diet soda, working on increasing water intake. Patient reports trying Ensure in the past, says it did not go over too good. He is amenable to trying Boost Breeze supplement to aid with meeting needs.   Per chart, weights have trended down ~12 lbs (4.6%) in the past 2 months which is insignificant. Patient reports his weights fluctuate depending on daily fluid intake. UBW 246-252 lb   Medications reviewed and include: D3, SSI, Melatonin, Morphine, Protonix, Requip IVF: NaCl @ 125 ml/hr IVPB: Cipro, Flagyl Labs: CBGs [phone] Lab Results  Component Value Date   HGBA1C 6.9 (H) 10/09/2019     NUTRITION - FOCUSED PHYSICAL  EXAM: Unable to complete at this time, RD working remotely.  Diet Order:   Diet Order            Diet full liquid Room service appropriate? Yes; Fluid consistency: Thin  Diet effective now                 EDUCATION NEEDS:   No education needs have been identified at this time  Skin:  Skin Assessment: Reviewed RN Assessment  Last BM:  7/21  Height:   Ht Readings from Last 1 Encounters:  10/08/19 6' (1.829 m)    Weight:   Wt Readings from Last 1 Encounters:  10/08/19 (!) 112.5 kg    BMI:  Body mass index is 33.63 kg/m.  Estimated Nutritional Needs:   Kcal:  2100-2300  Protein:  105-115  Fluid:  >/= 2.1 L/day   Lajuan Lines, RD, LDN Clinical Nutrition After Hours/Weekend Pager # in [place]

## 2019-10-09 NOTE — H&P (Signed)
History and Physical    Walter [name]  FYB:[id number]  DOB: [birth year]  DOA: 10/08/2019  PCP: Walter Pepper, MD Patient coming from: home  Chief Complaint: abdominal pain, nausea  HPI:  Walter [name] is a 74 yo CM with PMH diverticulosis, DMII, CAD, obesity, GERD, HTN, PAF, anxiety, HFpEF (Gr 1 DD, s/p ICD, EF 55% on 07/2019 echo) who presented to [facility] with worsening abdominal pain over the past 3 to 4 days.  He initially thought he had strained himself while doing yard work but as the pain did not improve, he presented for further evaluation.  He also endorses having a known hernia in his abdomen which was repaired years ago but he thinks has recurred.  Initially he had thought the hernia was "strangulated" however he has been passing gas and having bowel movements.  He does point to some of his pain over the hernia area as well as to his left abdomen. At home, he endorsed nausea but no vomiting.  Denied any fevers, chills, sweats.  He underwent CT abdomen/pelvis which revealed subtle stranding in a diverticulum in the distal sigmoid colon concerning for early developing diverticulitis.  No signs of perforation or abscess. 2 loops of small bowel were also seen consistent with his umbilical hernia.  There were no signs of obstruction or vascular compromise. He was treated with Levaquin and Flagyl as well as morphine for pain control which he states helped.  He was then transferred to [facility] for further treatment.  He does endorse that he has an upcoming trip to [place] and is supposed to leave this coming Tuesday.  He states he is able to cancel or push back the trip some if needed however is hoping to know approximately when he might get to go home or if his hospitalization will be prolonged.  Stated that we would need to see his clinical response in the next 24 hours and could then further have discussions regarding this.  He was understanding and in agreement.    I have  personally briefly reviewed patient's old medical records in [facility] and discussed patient with the ER provider when appropriate/indicated.  Assessment/Plan: Acute diverticulitis -CT appears to show early signs of acute sigmoid diverticulitis.  No abscess or perforation -Continue on Cipro/Flagyl and monitor clinical response -Keep n.p.o. -Continue fluids -If does have worsening pain, will need repeat CT to rule out development of abscess or perforation -Patient also concerned about upcoming trip to [place], leaving next Tuesday.  Will need to discuss further regarding if patient needs to postpone or cancel trip -Continue morphine for pain control.  Database reviewed, he does receive chronic opiates as well.  Will monitor for adequate pain control -Continue Zofran for nausea  Essential hypertension, benign -Continue Coreg and lisinopril.  Will need to hold if blood pressure downtrends  Non-occlusive coronary artery disease -Continue aspirin and Plavix for now.  No obvious surgical need at this time.  He is also on anticoagulation with Eliquis for his A. fib  Persistent atrial fibrillation (HCC) -Continue Eliquis, Coreg, amiodarone   Code Status: Full DVT Prophylaxis:Eliquis Anticipated disposition is to home  History: Past Medical History:  Diagnosis Date  . AICD (automatic cardioverter/defibrillator) present   . Anxiety   . Asbestosis ([place])    mild  . Atrial fibrillation ([place])   . Chronic low back pain    /sciatica- Dr Letta Median [facility])  . Complication of anesthesia 1996   previous surgery had  a run of v-tach (for gallbladder-1990's)-cannot lie completely flat and panis when oxygen mask placed over face  . Coronary artery disease, non-occlusive June 2011   Cardiac cath in [facility] following abnormal Myoview  . Delirium 05/06/2019  . DJD (degenerative joint disease)    /osteoarthritis  . Dyslipidemia, goal LDL below 100    On lovastatin  (myalgias with Zocor and Lipitor)  . Dyspnea    worse in hot weather  . Dysrhythmia    LBBB, in 2015-atrial fibrillation  . Essential hypertension   . 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 Cherrie Gauze Marrow- no problems with this  . Frozen shoulder    right  . GERD (gastroesophageal reflux disease)   . H/O asbestosis    when in [employer]-1964-1970,1971-1986 in [employer]-diagnosed in 1990  . History of kidney stones   . Hypogonadism male   . Irritable bowel   . Kidney stone   . Left bundle branch block (LBBB) on electrocardiogram    Diagnosed close to 20 years ago  . Myocardial infarction ([place])   . Non-ischemic cardiomyopathy [facility]) June 2011   EF 45-50%; suspect related to LBBB; repeat Echo March 2015: EF 50- 55%  . Obesity   . PONV (postoperative nausea and vomiting)   . Restless leg syndrome   . Rotator cuff arthropathy of right shoulder 05/14/2017  . Testicular cancer ([place]) 1972   left  . Tremor, essential 12/09/2015  . Type 2 diabetes mellitus without complication ([place]) 10/5629   Type 2.     Past Surgical History:  Procedure Laterality Date  . APPENDECTOMY N/A   . BI-VENTRICULAR IMPLANTABLE CARDIOVERTER DEFIBRILLATOR N/A 02/04/2014   SJM Quadra Assura BiV ICD implanted for primary prevention by [name]  . CARDIAC CATHETERIZATION  June 2011   Nonobstructive CAD  . CARDIAC DEFIBRILLATOR PLACEMENT  02/04/14   [facility] model [place] 3365-40Q (serial Number [id number]) biventricular ICD.  Marland Kitchen CARDIOVERSION N/A 10/13/2013   Procedure: CARDIOVERSION;  Surgeon: Larey Dresser, MD;  Location: [place];  Service: Cardiovascular;  Laterality: N/A;  . CARDIOVERSION  10-16-2013   DCCV by [name] following intracardiac echo to rule out LAA thrombus  . CARDIOVERSION N/A 10/16/2013   Procedure: CARDIOVERSION;  Surgeon: Coralyn Mark, MD;  Location: [facility] C[facility] LAB;  Service: Cardiovascular;  Laterality: N/A;  . CHOLECYSTECTOMY OPEN    . ELECTROPHYSIOLOGY STUDY N/A 10/16/2013    Procedure: ELECTROPHYSIOLOGY STUDY;  Surgeon: Coralyn Mark, MD;  Location: [facility] CATH LAB;  Service: Cardiovascular;  Laterality: N/A;  . ESOPHAGOGASTRODUODENOSCOPY N/A 10/15/2013   Procedure: ESOPHAGOGASTRODUODENOSCOPY (EGD);  Surgeon: Gatha Mayer, MD;  Location: [facility] ENDOSCOPY;  Service: Endoscopy;  Laterality: N/A;  bedside  . HERNIA REPAIR    . KNEE ARTHROSCOPY Left   . LEFT AND RIGHT HEART CATHETERIZATION WITH CORONARY ANGIOGRAM N/A 10/12/2013   Procedure: LEFT AND RIGHT HEART CATHETERIZATION WITH CORONARY ANGIOGRAM;  Surgeon: Troy Sine, MD;  Location: [facility] CATH LAB;  Service: Cardiovascular;  Laterality: N/A;  . lymph nodes    . RADIOFREQUENCY ABLATION NERVES     Back every 6 months  . REVERSE SHOULDER ARTHROPLASTY Right 05/14/2017   Procedure: TOTAL REVERSE SHOULDER ARTHROPLASTY;  Surgeon: Marchia Bond, MD;  Location: [place];  Service: Orthopedics;  Laterality: Right;  . ROTATOR CUFF REPAIR Right   . SURGERY SCROTAL / TESTICULAR Left   . TONSILLECTOMY Bilateral   . TOTAL HIP ARTHROPLASTY Right 02/22/2015   Procedure: RIGHT TOTAL HIP  ARTHROPLASTY ANTERIOR APPROACH;  Surgeon: Renette Butters, MD;  Location: [place];  Service: Orthopedics;  Laterality: Right;  . TOTAL HIP ARTHROPLASTY Left 05/26/2019   Procedure: TOTAL HIP ARTHROPLASTY;  Surgeon: Marchia Bond, MD;  Location: [facility];  Service: Orthopedics;  Laterality: Left;  . TRANSTHORACIC ECHOCARDIOGRAM  May 2011   EF 45% with diffuse hypokinesis; septal bounce from LBBB  . TRANSTHORACIC ECHOCARDIOGRAM  March 2015   EF 50-55%. Mild concentric LVH. Septal dyssynergy from LBBB      reports that he quit smoking about 46 years ago. His smoking use included cigarettes. He has never used smokeless tobacco. He reports current alcohol use. He reports that he does not use drugs.  Allergies  Allergen Reactions  . Statins Other (See Comments)    Myalgias.  Can only take Altoprev   . Cortisone Other (See Comments)    Received Cortisone  injection in left hip on Friday before Thanksgiving ,2020 and the next day, does not remember 3 days as he zoned out. Physician informed him to let Anesthesia know this.    Family History  Problem Relation Age of Onset  . Hypertension Mother   . Heart disease Mother   . Heart disease Father   . Hypertension Brother   . Heart attack Maternal Uncle   . Hypertension Brother   . Heart disease Paternal Uncle    Home Medications: Prior to Admission medications   Medication Sig Start Date End Date Taking? Authorizing Provider  albuterol (PROVENTIL HFA;VENTOLIN HFA) 108 (90 Base) MCG/ACT inhaler Inhale 1-2 puffs into the lungs every 6 (six) hours as needed for wheezing or shortness of breath.   Yes [provider]  amiodarone (PACERONE) 200 MG tablet Take 100 mg by mouth daily.   Yes [provider]  apixaban (ELIQUIS) 5 MG TABS tablet Take 5 mg by mouth 2 (two) times daily.    Yes [provider]  carvedilol (COREG) 12.5 MG tablet Take 12.5 mg by mouth 2 (two) times daily with a meal.   Yes [provider]  cholecalciferol (VITAMIN D3) 25 MCG (1000 UNIT) tablet Take 1,000 Units by mouth daily.   Yes [provider]  empagliflozin (JARDIANCE) 10 MG TABS tablet Take 10 mg by mouth daily.   Yes [provider]  esomeprazole (NEXIUM) 40 MG capsule Take 40 mg by mouth daily after breakfast.  11/19/18  Yes [provider]  furosemide (LASIX) 20 MG tablet Take 1 tablet (20 mg total) by mouth every other day. 08/10/19  Yes Larey Dresser, MD  gabapentin (NEURONTIN) 300 MG capsule Take 600 mg by mouth daily as needed (pain).    Yes [provider]  lisinopril (ZESTRIL) 10 MG tablet Take 10 mg by mouth daily.   Yes [provider]  melatonin 3 MG TABS tablet Take 9 mg by mouth at bedtime.   Yes [provider]  metFORMIN (GLUCOPHAGE) 500 MG tablet Take 1,000 mg by mouth 2 (two) times daily with a meal.    Yes [provider]  methocarbamol (ROBAXIN) 750 MG tablet Take 750 mg by mouth every 6 (six) hours as needed for muscle spasms.   Yes [provider]  morphine (MSIR) 15 MG tablet Take 15 mg by mouth in the morning, at noon, in the evening, and at bedtime.   Yes [provider]  omega-3 acid ethyl esters (LOVAZA) 1 G capsule Take 1 g by mouth 2 (two) times daily.    Yes  [provider]  rOPINIRole (REQUIP) 0.5 MG tablet Take 0.5 mg by mouth at bedtime.   Yes [provider]  rOPINIRole (REQUIP) 1 MG tablet Take 1 mg by mouth at bedtime.  11/28/15  Yes [provider]  sennosides-docusate sodium (SENOKOT-S) 8.6-50 MG tablet Take 2 tablets by mouth daily. Patient taking differently: Take 1 tablet by mouth daily as needed for constipation.  05/26/19  Yes Merlene Pulling K, PA-C  sildenafil (VIAGRA) 100 MG tablet Take 100 mg by mouth daily as needed for erectile dysfunction.   Yes [provider]  testosterone cypionate (DEPO-TESTOSTERONE) 200 MG/ML injection Inject 200 mg into the muscle See admin instructions. Inject 200 mg into the muscle every 10 days   Yes [provider]  zolpidem (AMBIEN) 5 MG tablet Take 5 mg by mouth at bedtime as needed for sleep.  09/27/19  Yes [provider]  ALTOPREV 40 MG 24 hr tablet Take 40 mg by mouth daily. 09/25/19   [provider]  empagliflozin (JARDIANCE) 25 MG TABS tablet Take 25 mg by mouth daily.    [provider]  lisinopril (ZESTRIL) 10 MG tablet Take 10 mg by mouth daily.     [provider]  methocarbamol (ROBAXIN) 750 MG tablet Take 750 mg by mouth every 6 (six) hours as needed for muscle spasms.    [provider]  morphine (MSIR) 15 MG tablet Take 15 mg by mouth 4 (four) times daily. 10/05/19   [provider]  oxyCODONE-acetaminophen (PERCOCET) 10-325 MG tablet Take 1 tablet by mouth 5 (five) times daily as needed. Patient not taking: Reported on  10/08/2019 08/06/19   [provider]  rOPINIRole (REQUIP) 0.5 MG tablet Take 0.5 mg by mouth daily as needed (restless leg).    [provider]  tiZANidine (ZANAFLEX) 4 MG capsule Take 4 mg by mouth 2 (two) times daily as needed for muscle spasms.  Patient not taking: Reported on 10/08/2019    [provider]    Review of Systems:  Pertinent items noted in HPI and remainder of comprehensive ROS otherwise negative.  Physical Exam: Vitals:   10/08/19 1700 10/08/19 1725 10/08/19 1840 10/08/19 2126  BP: (!) 124/89  (!) 145/88 (!) 121/92  Pulse: 59 72 64 72  Resp: 13 15 17 20   Temp:   97.9 F (36.6 C) 98.8 F (37.1 C)  TempSrc:   Oral Oral  SpO2: 98% 100% 98% 95%  Weight:      Height:       General appearance: alert, cooperative and no distress Head: Normocephalic, without obvious abnormality, atraumatic Eyes: EOMI Lungs: clear to auscultation bilaterally Heart: regular rate and rhythm and S1, S2 normal Abdomen: Palpable midline hernia that is soft, easily reducible, and mildly tender to palpation.  Left lower quadrant also tender to palpation with no rebound or guarding.  Bowel sounds present throughout Extremities: No edema Skin: mobility and turgor normal Neurologic: Grossly normal  Labs on Admission:  I have personally reviewed following labs and imaging studies Results for orders placed or performed during the hospital encounter of 10/08/19 (from the past 24 hour(s))  Comprehensive metabolic panel     Status: Abnormal   Collection Time: 10/08/19  1:48 PM  Result Value Ref Range   Sodium 134 (L) 135 - 145 mmol/L   Potassium 4.1 3.5 - 5.1 mmol/L   Chloride 100 98 - 111 mmol/L   CO2 22 22 - 32 mmol/L   Glucose, Bld 201 (H)  70 - 99 mg/dL   BUN 24 (H) 8 - 23 mg/dL   Creatinine, Ser 1.64 (H) 0.61 - 1.24 mg/dL   Calcium 8.5 (L) 8.9 - 10.3 mg/dL   Total Protein 6.6 6.5 - 8.1 g/dL   Albumin 3.8 3.5 - 5.0 g/dL   AST 20 15 - 41 U/L   ALT 19 0 - 44 U/L     Alkaline Phosphatase 62 38 - 126 U/L   Total Bilirubin 1.0 0.3 - 1.2 mg/dL   GFR calc non Af Amer 40 (L) >60 mL/min   GFR calc Af Amer 47 (L) >60 mL/min   Anion gap 12 5 - 15  Lipase, blood     Status: None   Collection Time: 10/08/19  1:48 PM  Result Value Ref Range   Lipase 22 11 - 51 U/L  Troponin I (High Sensitivity)     Status: None   Collection Time: 10/08/19  1:48 PM  Result Value Ref Range   Troponin I (High Sensitivity) 6 <18 ng/L  Lactic acid, plasma     Status: Abnormal   Collection Time: 10/08/19  1:48 PM  Result Value Ref Range   Lactic Acid, Venous 2.3 (HH) 0.5 - 1.9 mmol/L  CBC with Differential     Status: Abnormal   Collection Time: 10/08/19  1:48 PM  Result Value Ref Range   WBC 7.7 4.0 - 10.5 K/uL   RBC 5.60 4.22 - 5.81 MIL/uL   Hemoglobin 16.6 13.0 - 17.0 g/dL   HCT 52.8 (H) 39 - 52 %   MCV 94.3 80.0 - 100.0 fL   MCH 29.6 26.0 - 34.0 pg   MCHC 31.4 30.0 - 36.0 g/dL   RDW 14.5 11.5 - 15.5 %   Platelets 231 150 - 400 K/uL   nRBC 0.0 0.0 - 0.2 %   Neutrophils Relative % 71 %   Neutro Abs 5.6 1.7 - 7.7 K/uL   Lymphocytes Relative 18 %   Lymphs Abs 1.4 0.7 - 4.0 K/uL   Monocytes Relative 7 %   Monocytes Absolute 0.6 0 - 1 K/uL   Eosinophils Relative 2 %   Eosinophils Absolute 0.1 0 - 0 K/uL   Basophils Relative 1 %   Basophils Absolute 0.1 0 - 0 K/uL   Immature Granulocytes 1 %   Abs Immature Granulocytes 0.04 0.00 - 0.07 K/uL  Protime-INR     Status: None   Collection Time: 10/08/19  1:48 PM  Result Value Ref Range   Prothrombin Time 13.7 11.4 - 15.2 seconds   INR 1.1 0.8 - 1.2  Urinalysis, Routine w reflex microscopic     Status: Abnormal   Collection Time: 10/08/19  1:48 PM  Result Value Ref Range   Color, Urine YELLOW YELLOW   APPearance CLEAR CLEAR   Specific Gravity, Urine 1.015 1.005 - 1.030   pH 7.0 5.0 - 8.0   Glucose, UA >=500 (A) NEGATIVE mg/dL   Hgb urine dipstick NEGATIVE NEGATIVE   Bilirubin Urine NEGATIVE NEGATIVE   Ketones, ur  NEGATIVE NEGATIVE mg/dL   Protein, ur NEGATIVE NEGATIVE mg/dL   Nitrite NEGATIVE NEGATIVE   Leukocytes,Ua NEGATIVE NEGATIVE  SARS Coronavirus 2 by RT PCR (hospital order, performed in [place] hospital lab) Nasopharyngeal Nasopharyngeal Swab     Status: None   Collection Time: 10/08/19  1:48 PM   Specimen: Nasopharyngeal Swab  Result Value Ref Range   SARS Coronavirus 2 NEGATIVE NEGATIVE  Occult blood card to lab, stool  Status: None   Collection Time: 10/08/19  1:48 PM  Result Value Ref Range   Fecal Occult Bld NEGATIVE NEGATIVE  Urinalysis, Microscopic (reflex)     Status: Abnormal   Collection Time: 10/08/19  1:48 PM  Result Value Ref Range   RBC / HPF NONE SEEN 0 - 5 RBC/hpf   WBC, UA 0-5 0 - 5 WBC/hpf   Bacteria, UA RARE (A) NONE SEEN   Squamous Epithelial / LPF 0-5 0 - 5  Troponin I (High Sensitivity)     Status: None   Collection Time: 10/08/19  4:29 PM  Result Value Ref Range   Troponin I (High Sensitivity) 5 <18 ng/L  Lactic acid, plasma     Status: None   Collection Time: 10/08/19  5:30 PM  Result Value Ref Range   Lactic Acid, Venous 1.3 0.5 - 1.9 mmol/L     Radiological Exams on Admission: CT Abdomen Pelvis W Contrast  Result Date: 10/08/2019 CLINICAL DATA:  Diverticulitis suspected EXAM: CT ABDOMEN AND PELVIS WITH CONTRAST TECHNIQUE: Multidetector CT imaging of the abdomen and pelvis was performed using the standard protocol following bolus administration of intravenous contrast. CONTRAST:  81mL OMNIPAQUE IOHEXOL 300 MG/ML  SOLN COMPARISON:  CT 02/10/2019 FINDINGS: Lower chest: Calcified basilar pleural plaque is similar to prior. Lung bases otherwise clear. Normal cardiac size. No pericardial effusion. Pacer leads seen at the cardiac apex and right atrium. Calcification of the native coronary arteries. Hepatobiliary: No worrisome focal liver lesions. Smooth liver surface contour. Normal hepatic attenuation. Post cholecystectomy. No significant biliary ductal  dilatation or visible calcified gallstones. Pancreas: Mild pancreatic atrophy without focal inflammation, ductal dilatation or discernible lesion. Spleen: Normal in size. No concerning splenic lesions. Adrenals/Urinary Tract: Normal adrenal glands. Kidneys enhance and excrete symmetrically. Stable mild-to-moderate bilateral symmetric perinephric stranding, a nonspecific finding though may correlate with either age or decreased renal function. No worrisome renal lesion. No urolithiasis or hydronephrosis. Urinary bladder partially obscured by streak artifact. Urinary bladder is largely decompressed at the time of exam and therefore poorly evaluated by CT imaging. Within these limitations, no gross bladder abnormality. Stomach/Bowel: Distal esophagus, stomach and duodenum are unremarkable. Two loops of small bowel again seen protruding into a small ventral midline hernia superior to the umbilicus (5/36) without resulting obstruction or vascular compromise. No small bowel thickening or dilatation. No colonic dilatation or wall thickening. Scattered colonic diverticula most pronounced in the distal colon with Pap some mild hazy adjacent stranding (2/75, 5/46) which is minimally increased from prior but could reflect an early or developing diverticulitis. Vascular/Lymphatic: Atherosclerotic calcifications within the abdominal aorta and branch vessels. No aneurysm or ectasia. No enlarged abdominopelvic lymph nodes. Reproductive: Largely obscured by streak artifact. Other: Ventral midline hernias, as above, unchanged in appearance from prior. Fat containing right inguinal hernia. No abdominopelvic free air or fluid. No collection or abscess. Musculoskeletal: Mild scoliotic curvature of the spine similar to prior. 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 No acute osseous abnormality or suspicious osseous lesion. Multilevel degenerative changes are present in the imaged portions of the  spine. Bilateral iliopsoas bursal fluid anterior to the hips and partially obscured by streak. Asymmetric atrophy of the right psoas muscle belly. IMPRESSION: 1. Colonic diverticulosis most pronounced in the distal colon with minimal hazy adjacent stranding centered upon a diverticulum in the distal sigmoid which is minimally increased from prior but could reflect an early or developing diverticulitis. No evidence of perforation or abscess formation. 2. Two loops of small  bowel again seen protruding into a small ventral midline hernia superior to the umbilicus without resulting obstruction or vascular compromise. 3. Bilateral iliopsoas bursal fluid anterior to the hips and partially obscured by streak artifact. Chronic atrophy of the right psoas. 4. 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 5. Calcified pleural plaques may reflect prior asbestos exposure related changes. 6. Aortic Atherosclerosis (ICD10-I70.0). Electronically Signed   By: Lovena Le M.D.   On: 10/08/2019 15:07   CT Abdomen Pelvis W Contrast  Final Result      Consults called:  N/A  EKG: Independently reviewed. Normal sinus rhythm   Dwyane Dee, MD [facility] Pager: Secure chat  If 7PM-7AM, please contact night-coverage [email or web address] Use universal [place] password for that web site. If you do not have the password, please call the hospital operator.  10/09/2019, 1:03 AM

## 2019-10-09 NOTE — Assessment & Plan Note (Signed)
-  Continue Eliquis, Coreg, amiodarone

## 2019-10-09 NOTE — Progress Notes (Signed)
Pharmacy Antibiotic Note  Walter [name] is a 74 y.o. male admitted on 10/08/2019 with intra-abd infection.  Pharmacy has been consulted for Zosyn dosing. Prev abx   Plan: Zosyn 3.375g IV q8h (4 hour infusion).  Height: 6' (182.9 cm) Weight: (!) 112.5 kg (248 lb) IBW/kg (Calculated) : 77.6  Temp (24hrs), Avg:98.3 F (36.8 C), Min:97.9 F (36.6 C), Max:98.8 F (37.1 C)  Recent Labs  Lab 10/08/19 1348 10/08/19 1730 10/09/19 0115  WBC 7.7  --  6.5  CREATININE 1.64*  --  1.24  LATICACIDVEN 2.3* 1.3  --     Estimated Creatinine Clearance: 66.7 mL/min (by C-G formula based on SCr of 1.24 mg/dL).    Allergies  Allergen Reactions  . Statins Other (See Comments)    Myalgias.  Can only take Altoprev   . Cortisone Other (See Comments)    Received Cortisone injection in left hip on Friday before Thanksgiving ,2020 and the next day, does not remember 3 days as he zoned out. Physician informed him to let Anesthesia know this.   Antimicrobials this admission: 7/22 LVQ x1 7/23 Cipro>> d/c, none charted 7/22 Flagyl>> 7/23 7/23 Zosyn >>  Dose adjustments this admission:  Microbiology results: 7/22 Covid: neg 7/22 U/A: rare bacteria, glucose elevated  Thank you for allowing pharmacy to be a part of this patient's care.  Minda Ditto PharmD 10/09/2019 2:40 PM

## 2019-10-09 NOTE — Assessment & Plan Note (Addendum)
-  CT appears to show early signs of acute sigmoid diverticulitis.  No abscess or perforation -Continue on Cipro/Flagyl and monitor clinical response -Keep n.p.o. -Continue fluids -If does have worsening pain, will need repeat CT to rule out development of abscess or perforation -Patient also concerned about upcoming trip to [place], leaving next Tuesday.  Will need to discuss further regarding if patient needs to postpone or cancel trip -Continue morphine for pain control.  Database reviewed, he does receive chronic opiates as well.  Will monitor for adequate pain control -Continue Zofran for nausea

## 2019-10-10 DIAGNOSIS — I10 Essential (primary) hypertension: Secondary | ICD-10-CM

## 2019-10-10 DIAGNOSIS — I2510 Atherosclerotic heart disease of native coronary artery without angina pectoris: Secondary | ICD-10-CM

## 2019-10-10 LAB — BASIC METABOLIC PANEL
Anion gap: 9 (ref 5–15)
BUN: 13 mg/dL (ref 8–23)
CO2: 22 mmol/L (ref 22–32)
Calcium: 8.8 mg/dL — ABNORMAL LOW (ref 8.9–10.3)
Chloride: 106 mmol/L (ref 98–111)
Creatinine, Ser: 1.07 mg/dL (ref 0.61–1.24)
GFR calc Af Amer: >60 mL/min (ref >60)
GFR calc non Af Amer: >60 mL/min (ref >60)
Glucose, Bld: 115 mg/dL — ABNORMAL HIGH (ref 70–99)
Potassium: 3.6 mmol/L (ref 3.5–5.1)
Sodium: 137 mmol/L (ref 135–145)

## 2019-10-10 LAB — GLUCOSE, CAPILLARY
Glucose-Capillary: 111 mg/dL — ABNORMAL HIGH (ref 70–99)
Glucose-Capillary: 118 mg/dL — ABNORMAL HIGH (ref 70–99)

## 2019-10-10 MED ORDER — ONDANSETRON 4 MG PO TBDP
4.0000 mg | ORAL_TABLET | Freq: Three times a day (TID) | ORAL | 0 refills | Status: DC | PRN
Start: 2019-10-10 — End: 2020-07-10

## 2019-10-10 MED ORDER — AMOXICILLIN-POT CLAVULANATE 875-125 MG PO TABS
1.0000 | ORAL_TABLET | Freq: Two times a day (BID) | ORAL | 0 refills | Status: AC
Start: 2019-10-10 — End: 2019-10-18

## 2019-10-10 NOTE — Progress Notes (Signed)
Pt discharged home, instructions reviewed with pt, acknowledged understanding. Wife arrived to transport pt home in stable condition.

## 2019-10-10 NOTE — Plan of Care (Signed)
  Problem: Nutrition: Goal: Adequate nutrition will be maintained Outcome: Adequate for Discharge   Problem: Elimination: Goal: Will not experience complications related to bowel motility Outcome: Adequate for Discharge   Problem: Pain Managment: Goal: General experience of comfort will improve Outcome: Progressing   Problem: Safety: Goal: Ability to remain free from injury will improve Outcome: Progressing

## 2019-10-10 NOTE — Discharge Summary (Signed)
Physician Discharge Summary  ROREY BISSON RWE:[id number] DOB: 06/27/1945 DOA: 10/08/2019  PCP: London Pepper, MD  Admit date: 10/08/2019 Discharge date: 10/10/2019  Admitted From: Home Disposition: Home   Recommendations for Outpatient Follow-up:  1. Follow up with PCP in 1-2 weeks 2. Follow up with GI for colonoscopy following Tx mild acute diverticulitis.  Home Health: None Equipment/Devices: None Discharge Condition: Stable CODE STATUS: Full Diet recommendation: As tolerated, heart healthy  Brief/Interim Summary: Walter [name] is a 74 yo male with a history of T2DM, CAD, obesity (BMI 33), GERD, HTN, PAF, anxiety, HFpEF (Gr 1 DD, s/p ICD, EF 55% on 07/2019 echo), chronic pain on chronic narcotics per pain management, who presented 7/22 to [facility] with worsening lower abdominal pain. It began as lower back pain, later radiating toward the lower abdomen. He felt it may be due to a hernia which had been treated with mesh previously. He was hypotensive with AKI and lower abdominal tenderness. CT showed ventral hernia with small bowel loops and no evidence of strangulation or inflammation, though there appeared to be inflammatory changes around sigmoid diverticulum consistent with early diverticulitis. 3L IV fluids and levaquin, flagyl were given with improvement in blood pressure and admission was requested.  Diet was slowly advanced, IV medications weaned, and pain improved. He is tolerating a diet and wants to go home. Will complete a course of augmentin as outpatient.   Discharge Diagnoses:  Active Problems:   Essential hypertension, benign   Non-occlusive coronary artery disease   Persistent atrial fibrillation (HCC)   Acute diverticulitis  Acute diverticulitis: First episode. Exam is benign, no fever or leukocytosis. Had "normal colonoscopy remotely and last colon CA screening was cologuard felt to be low risk per Dr. [name].  - Continue antibiotics, transition zosyn > augmentin    - Discussed need for follow up colonoscopy after convalescence  AKI: Resolved. - Avoid nephrotoxins, stopped fluoroquinolone.  Chronic HFpEF: Appears euvolemic.  - Continue home medications  PAF:  - Continue amiodarone, coreg, eliquis  T2DM:  - Continue home medication  HTN:  - Continue lisinopril   Chronic pain:  - Continue home medication regimen: MSIR 15mg  QID. Confirmed on PDMP this was prescribed #120 for 30-day supply on 7/19 by Vanice Sarah, PA who has been prescribing oxycodone previously.   Obesity: Body mass index is 33.63 kg/m.   Discharge Instructions Discharge Instructions    Discharge instructions   Complete by: As directed    You were admitted for early acute diverticulitis and have shown improvement. You are tolerating a diet with improved abdominal pain. You are stable for discharge but will need to continue antibiotics (augmentin twice daily for 8 more days sent to your pharmacy) and may take zofran (also sent to your pharmacy) as needed for nausea. You will need a colonoscopy 6-8 weeks after this acute episode, so follow up with Dr. [name]. Otherwise, see your PCP in the next 1-2 weeks and seek medical attention if you develop a fever, worse abdominal pain, or vomiting not improved with zofran. Have fun in [place]!!     Allergies as of 10/10/2019      Reactions   Statins Other (See Comments)   Myalgias.  Can only take Altoprev   Cortisone Other (See Comments)   Received Cortisone injection in left hip on Friday before Thanksgiving ,2020 and the next day, does not remember 3 days as he zoned out. Physician informed him to let Anesthesia know this.      Medication List  STOP taking these medications   tiZANidine 4 MG capsule Commonly known as: ZANAFLEX     TAKE these medications   albuterol 108 (90 Base) MCG/ACT inhaler Commonly known as: VENTOLIN HFA Inhale 1-2 puffs into the lungs every 6 (six) hours as needed for wheezing or shortness of  breath.   Altoprev 40 MG 24 hr tablet Generic drug: lovastatin Take 40 mg by mouth daily.   amiodarone 200 MG tablet Commonly known as: PACERONE Take 100 mg by mouth daily.   amoxicillin-clavulanate 875-125 MG tablet Commonly known as: Augmentin Take 1 tablet by mouth 2 (two) times daily for 8 days.   apixaban 5 MG Tabs tablet Commonly known as: ELIQUIS Take 5 mg by mouth 2 (two) times daily.   carvedilol 12.5 MG tablet Commonly known as: COREG Take 12.5 mg by mouth 2 (two) times daily with a meal.   cholecalciferol 25 MCG (1000 UNIT) tablet Commonly known as: VITAMIN D3 Take 1,000 Units by mouth daily.   Depo-Testosterone 200 MG/ML injection Generic drug: testosterone cypionate Inject 200 mg into the muscle See admin instructions. Inject 200 mg into the muscle every 10 days   esomeprazole 40 MG capsule Commonly known as: NEXIUM Take 40 mg by mouth daily after breakfast.   furosemide 20 MG tablet Commonly known as: LASIX Take 1 tablet (20 mg total) by mouth every other day.   gabapentin 300 MG capsule Commonly known as: NEURONTIN Take 600 mg by mouth daily as needed (pain).   Jardiance 25 MG Tabs tablet Generic drug: empagliflozin Take 25 mg by mouth daily.   Jardiance 10 MG Tabs tablet Generic drug: empagliflozin Take 10 mg by mouth daily.   lisinopril 10 MG tablet Commonly known as: ZESTRIL Take 10 mg by mouth daily.   lisinopril 10 MG tablet Commonly known as: ZESTRIL Take 10 mg by mouth daily.   melatonin 3 MG Tabs tablet Take 9 mg by mouth at bedtime.   metFORMIN 500 MG tablet Commonly known as: GLUCOPHAGE Take 1,000 mg by mouth 2 (two) times daily with a meal.   methocarbamol 750 MG tablet Commonly known as: ROBAXIN Take 750 mg by mouth every 6 (six) hours as needed for muscle spasms.   methocarbamol 750 MG tablet Commonly known as: ROBAXIN Take 750 mg by mouth every 6 (six) hours as needed for muscle spasms.   morphine 15 MG  tablet Commonly known as: MSIR Take 15 mg by mouth 4 (four) times daily. What changed: Another medication with the same name was removed. Continue taking this medication, and follow the directions you see here.   omega-3 acid ethyl esters 1 g capsule Commonly known as: LOVAZA Take 1 g by mouth 2 (two) times daily.   ondansetron 4 MG disintegrating tablet Commonly known as: ZOFRAN-ODT Take 1 tablet (4 mg total) by mouth every 8 (eight) hours as needed for nausea or vomiting.   rOPINIRole 0.5 MG tablet Commonly known as: REQUIP Take 0.5 mg by mouth daily as needed (restless leg).   rOPINIRole 0.5 MG tablet Commonly known as: REQUIP Take 0.5 mg by mouth at bedtime.   rOPINIRole 1 MG tablet Commonly known as: REQUIP Take 1 mg by mouth at bedtime.   sennosides-docusate sodium 8.6-50 MG tablet Commonly known as: SENOKOT-S Take 2 tablets by mouth daily. What changed:   how much to take  when to take this  reasons to take this   sildenafil 100 MG tablet Commonly known as: VIAGRA Take 100 mg by mouth daily  as needed for erectile dysfunction.   zolpidem 5 MG tablet Commonly known as: AMBIEN Take 5 mg by mouth at bedtime as needed for sleep.       Follow-up Information    London Pepper, MD. Schedule an appointment as soon as possible for a visit in 1 week(s).   Specialty: Family Medicine Contact information: [place] [address] [phone]        Wonda Horner, MD. Schedule an appointment as soon as possible for a visit in 6 week(s).   Specialty: Gastroenterology Contact information: [address] [place] [address] [phone]              Allergies  Allergen Reactions  . Statins Other (See Comments)    Myalgias.  Can only take Altoprev   . Cortisone Other (See Comments)    Received Cortisone injection in left hip on Friday before Thanksgiving ,2020 and the next day, does not remember 3 days as  he zoned out. Physician informed him to let Anesthesia know this.    Consultations:  None  Procedures/Studies: CT Abdomen Pelvis W Contrast  Result Date: 10/08/2019 CLINICAL DATA:  Diverticulitis suspected EXAM: CT ABDOMEN AND PELVIS WITH CONTRAST TECHNIQUE: Multidetector CT imaging of the abdomen and pelvis was performed using the standard protocol following bolus administration of intravenous contrast. CONTRAST:  36mL OMNIPAQUE IOHEXOL 300 MG/ML  SOLN COMPARISON:  CT 02/10/2019 FINDINGS: Lower chest: Calcified basilar pleural plaque is similar to prior. Lung bases otherwise clear. Normal cardiac size. No pericardial effusion. Pacer leads seen at the cardiac apex and right atrium. Calcification of the native coronary arteries. Hepatobiliary: No worrisome focal liver lesions. Smooth liver surface contour. Normal hepatic attenuation. Post cholecystectomy. No significant biliary ductal dilatation or visible calcified gallstones. Pancreas: Mild pancreatic atrophy without focal inflammation, ductal dilatation or discernible lesion. Spleen: Normal in size. No concerning splenic lesions. Adrenals/Urinary Tract: Normal adrenal glands. Kidneys enhance and excrete symmetrically. Stable mild-to-moderate bilateral symmetric perinephric stranding, a nonspecific finding though may correlate with either age or decreased renal function. No worrisome renal lesion. No urolithiasis or hydronephrosis. Urinary bladder partially obscured by streak artifact. Urinary bladder is largely decompressed at the time of exam and therefore poorly evaluated by CT imaging. Within these limitations, no gross bladder abnormality. Stomach/Bowel: Distal esophagus, stomach and duodenum are unremarkable. Two loops of small bowel again seen protruding into a small ventral midline hernia superior to the umbilicus (3/01) without resulting obstruction or vascular compromise. No small bowel thickening or dilatation. No colonic dilatation or wall  thickening. Scattered colonic diverticula most pronounced in the distal colon with Pap some mild hazy adjacent stranding (2/75, 5/46) which is minimally increased from prior but could reflect an early or developing diverticulitis. Vascular/Lymphatic: Atherosclerotic calcifications within the abdominal aorta and branch vessels. No aneurysm or ectasia. No enlarged abdominopelvic lymph nodes. Reproductive: Largely obscured by streak artifact. Other: Ventral midline hernias, as above, unchanged in appearance from prior. Fat containing right inguinal hernia. No abdominopelvic free air or fluid. No collection or abscess. Musculoskeletal: Mild scoliotic curvature of the spine similar to prior. 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 No acute osseous abnormality or suspicious osseous lesion. Multilevel degenerative changes are present in the imaged portions of the spine. Bilateral iliopsoas bursal fluid anterior to the hips and partially obscured by streak. Asymmetric atrophy of the right psoas muscle belly. IMPRESSION: 1. Colonic diverticulosis most pronounced in the distal colon with minimal hazy  adjacent stranding centered upon a diverticulum in the distal sigmoid which is minimally increased from prior but could reflect an early or developing diverticulitis. No evidence of perforation or abscess formation. 2. Two loops of small bowel again seen protruding into a small ventral midline hernia superior to the umbilicus without resulting obstruction or vascular compromise. 3. Bilateral iliopsoas bursal fluid anterior to the hips and partially obscured by streak artifact. Chronic atrophy of the right psoas. 4. 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 5. Calcified pleural plaques may reflect prior asbestos exposure related changes. 6. Aortic Atherosclerosis (ICD10-I70.0). Electronically Signed   By: Lovena Le M.D.   On: 10/08/2019 15:07        Subjective: Feels much better, minimal abdominal pain, no nausea with advancing diet. Wants to go home. No new complaints.   Discharge Exam: Vitals:   10/09/19 2013 10/10/19 0419  BP: (!) 149/99 (!) 138/91  Pulse: 64 64  Resp: 20 18  Temp: 98.9 F (37.2 C) 98.3 F (36.8 C)  SpO2: 98% 94%   General: Pt is alert, awake, not in acute distress Cardiovascular: RRR, S1/S2 +, no rubs, no gallops Respiratory: CTA bilaterally, no wheezing, no rhonchi Abdominal: Soft, no significant tenderness. Ventral hernia palpable nontender, no erythema, ND, bowel sounds + Extremities: No edema, no cyanosis  Labs: BNP (last 3 results) No results for input(s): BNP in the last 8760 hours. Basic Metabolic Panel: Recent Labs  Lab 10/08/19 1348 10/09/19 0115 10/10/19 0524  NA 134* 137 137  K 4.1 4.1 3.6  CL 100 105 106  CO2 22 24 22   GLUCOSE 201* 109* 115*  BUN 24* 19 13  CREATININE 1.64* 1.24 1.07  CALCIUM 8.5* 8.3* 8.8*  MG  --  2.0  --    Liver Function Tests: Recent Labs  Lab 10/08/19 1348  AST 20  ALT 19  ALKPHOS 62  BILITOT 1.0  PROT 6.6  ALBUMIN 3.8   Recent Labs  Lab 10/08/19 1348  LIPASE 22   No results for input(s): AMMONIA in the last 168 hours. CBC: Recent Labs  Lab 10/08/19 1348 10/09/19 0115  WBC 7.7 6.5  NEUTROABS 5.6 4.2  HGB 16.6 15.4  HCT 52.8* 50.1  MCV 94.3 97.3  PLT 231 207   Cardiac Enzymes: No results for input(s): CKTOTAL, CKMB, CKMBINDEX, TROPONINI in the last 168 hours. BNP: Invalid input(s): POCBNP CBG: Recent Labs  Lab 10/09/19 1621 10/09/19 2011 10/09/19 2351 10/10/19 0414 10/10/19 0810  GLUCAP 116* 137* 118* 111* 118*   D-Dimer No results for input(s): DDIMER in the last 72 hours. Hgb A1c Recent Labs    10/09/19 0115  HGBA1C 6.9*   Lipid Profile No results for input(s): CHOL, HDL, LDLCALC, TRIG, CHOLHDL, LDLDIRECT in the last 72 hours. Thyroid function studies No results for input(s): TSH, T4TOTAL, T3FREE,  THYROIDAB in the last 72 hours.  Invalid input(s): FREET3 Anemia work up No results for input(s): VITAMINB12, FOLATE, FERRITIN, TIBC, IRON, RETICCTPCT in the last 72 hours. Urinalysis    Component Value Date/Time   COLORURINE YELLOW 10/08/2019 1348   APPEARANCEUR CLEAR 10/08/2019 1348   LABSPEC 1.015 10/08/2019 1348   PHURINE 7.0 10/08/2019 1348   GLUCOSEU >=500 (A) 10/08/2019 1348   HGBUR NEGATIVE 10/08/2019 1348   BILIRUBINUR NEGATIVE 10/08/2019 1348   KETONESUR NEGATIVE 10/08/2019 1348   PROTEINUR NEGATIVE 10/08/2019 1348   UROBILINOGEN 1.0 11/14/2013 1522   NITRITE NEGATIVE 10/08/2019 [place] 10/08/2019 1348    Microbiology  Recent Results (from the past 240 hour(s))  SARS Coronavirus 2 by RT PCR (hospital order, performed in [facility] hospital lab) Nasopharyngeal Nasopharyngeal Swab     Status: None   Collection Time: 10/08/19  1:48 PM   Specimen: Nasopharyngeal Swab  Result Value Ref Range Status   SARS Coronavirus 2 NEGATIVE NEGATIVE Final    Comment: (NOTE) SARS-CoV-2 target nucleic acids are NOT DETECTED.  The SARS-CoV-2 RNA is generally detectable in upper and lower respiratory specimens during the acute phase of infection. The lowest concentration of SARS-CoV-2 viral copies this assay can detect is 250 copies / mL. A negative result does not preclude SARS-CoV-2 infection and should not be used as the sole basis for treatment or other patient management decisions.  A negative result may occur with improper specimen collection / handling, submission of specimen other than nasopharyngeal swab, presence of viral mutation(s) within the areas targeted by this assay, and inadequate number of viral copies (<250 copies / mL). A negative result must be combined with clinical observations, patient history, and epidemiological information.  Fact Sheet for Patients:   StrictlyIdeas.no  Fact Sheet for Healthcare  Providers: BankingDealers.co.za  This test is not yet approved or  cleared by the [place] FDA and has been authorized for detection and/or diagnosis of SARS-CoV-2 by FDA under an Emergency Use Authorization (EUA).  This EUA will remain in effect (meaning this test can be used) for the duration of the COVID-19 declaration under Section 564(b)(1) of the Act, 21 U.S.C. section 360bbb-3(b)(1), unless the authorization is terminated or revoked sooner.  Performed at [facility], [address], [place], [place] [address]     Time coordinating discharge: Approximately 40 minutes  Patrecia Pour, MD  [facility] 10/10/2019, 9:18 AM

## 2019-10-27 ENCOUNTER — Telehealth: Payer: Self-pay

## 2019-10-27 NOTE — Telephone Encounter (Signed)
Spoke with pt.  He reports intermittent pain 5/10 around ICD.  The pain occurred for most of last night.  Nothing seems to relieve the pain except for not using his arm.  Pt would like to have device checked in clinic.  Offered appt on 8/12, pt unable to make the appt.  Scheduled for next avail on 8/19

## 2019-10-27 NOTE — Telephone Encounter (Signed)
Returned patient call as requested by voice mail message.  Patient reports he has a soreness around the device and chest area for past 2 days. He reports he feels the soreness more when he is moving his arm around.  He denies chest pain but feels like it is a local pain. He denies any redness, swelling or drainage at the device site. Reviewed [facility] remote transmission he sent and advised report did not appear to have any abnormal episodes for the past 2 days.  He reports he was admitted to the hospital 7/22-7/24 for diverticulitis.  Asked if he would like to make an appointment with device clinic to check the device site and he said yes.  Advised would send the request to device clinic triage so they can schedule him an office appointment with device clinic.  Phone note routed to device clinic triage.

## 2019-11-02 ENCOUNTER — Ambulatory Visit (INDEPENDENT_AMBULATORY_CARE_PROVIDER_SITE_OTHER): Payer: Medicare Other

## 2019-11-02 DIAGNOSIS — Z09 Encounter for follow-up examination after completed treatment for conditions other than malignant neoplasm: Secondary | ICD-10-CM | POA: Diagnosis not present

## 2019-11-02 DIAGNOSIS — Z95810 Presence of automatic (implantable) cardiac defibrillator: Secondary | ICD-10-CM

## 2019-11-02 DIAGNOSIS — I519 Heart disease, unspecified: Secondary | ICD-10-CM | POA: Diagnosis not present

## 2019-11-02 DIAGNOSIS — I10 Essential (primary) hypertension: Secondary | ICD-10-CM | POA: Diagnosis not present

## 2019-11-02 DIAGNOSIS — I4891 Unspecified atrial fibrillation: Secondary | ICD-10-CM | POA: Diagnosis not present

## 2019-11-02 DIAGNOSIS — K921 Melena: Secondary | ICD-10-CM | POA: Diagnosis not present

## 2019-11-02 DIAGNOSIS — Z8719 Personal history of other diseases of the digestive system: Secondary | ICD-10-CM | POA: Diagnosis not present

## 2019-11-03 DIAGNOSIS — M25559 Pain in unspecified hip: Secondary | ICD-10-CM | POA: Diagnosis not present

## 2019-11-03 DIAGNOSIS — G894 Chronic pain syndrome: Secondary | ICD-10-CM | POA: Diagnosis not present

## 2019-11-03 DIAGNOSIS — M1710 Unilateral primary osteoarthritis, unspecified knee: Secondary | ICD-10-CM | POA: Diagnosis not present

## 2019-11-03 DIAGNOSIS — M47817 Spondylosis without myelopathy or radiculopathy, lumbosacral region: Secondary | ICD-10-CM | POA: Diagnosis not present

## 2019-11-04 NOTE — Progress Notes (Signed)
EPIC Encounter for ICM Monitoring  Patient Name: Walter [name] is a 74 y.o. male Date: 11/04/2019 Primary Care Physican: London Pepper, MD Primary Cardiologist:[place] Electrophysiologist: Allred Bi-V Pacing:99% 8/18/2021Weight:240lbs   Spoke with patient and reports feeling well at this time.  Denies fluid symptoms.    CorVuethoracic impedancenormal.   Furosemide20 mgTake 1 tablet (20 mg total) bymouthevery other day.  Labs: 08/25/2019 Creatinine 1.13, BUN 16, Potassium 4.1, Sodium 137, GFR >60 08/10/2019 Creatinine 1.48, BUN 38, Potassium 5.5, Sodium 135, GFR 46-53 07/03/2019 Creatinine 1.09, BUN 23, Potassium 4.6, Sodium 136, GFR 66-76 A complete set of results can be found in Results Review.  Recommendations:No changes and encouraged to call if experiencing any fluid symptoms.  Follow-up plan: ICM clinic phone appointment on9/20/2021.91 day device clinic remote transmission9/03/2019.   EP/Cardiology Office Visits:  02/10/2020 with Dr Aundra Dubin will be 6 month follow up but no appt scheduled.  Recall 07/02/2020 with Dr. Rayann Heman.    Copy of ICM check sent to Dr. Rayann Heman.   3 month ICM trend: 11/03/2019    1 Year ICM trend:       Rosalene Billings, RN 11/04/2019 2:22 PM

## 2019-11-05 ENCOUNTER — Other Ambulatory Visit: Payer: Self-pay | Admitting: Gastroenterology

## 2019-11-05 ENCOUNTER — Ambulatory Visit (INDEPENDENT_AMBULATORY_CARE_PROVIDER_SITE_OTHER): Payer: Medicare Other | Admitting: Emergency Medicine

## 2019-11-05 ENCOUNTER — Other Ambulatory Visit: Payer: Self-pay

## 2019-11-05 DIAGNOSIS — Z95810 Presence of automatic (implantable) cardiac defibrillator: Secondary | ICD-10-CM

## 2019-11-05 DIAGNOSIS — I428 Other cardiomyopathies: Secondary | ICD-10-CM

## 2019-11-05 DIAGNOSIS — K5792 Diverticulitis of intestine, part unspecified, without perforation or abscess without bleeding: Secondary | ICD-10-CM

## 2019-11-05 NOTE — Progress Notes (Signed)
ICD site check due to report of episodes of sharp pain lasting a few seconds in area of device for 2-3 days with movement of LUE. He repots he had been using the LUE moving things the day prior to the pain. Device site with no edema, drainage, or pain with palpation or movement of LUE. Remote transmission on 11/03/19 showed device function WNL with no alets or events recorded. Patient asvised to call device clinic if he has any change in device site or pain returns.

## 2019-11-05 NOTE — Patient Instructions (Signed)
Call the device clinic if you have any swelling, drainage or pain at the device site. [phone]

## 2019-11-10 DIAGNOSIS — M47817 Spondylosis without myelopathy or radiculopathy, lumbosacral region: Secondary | ICD-10-CM | POA: Diagnosis not present

## 2019-11-11 DIAGNOSIS — Z09 Encounter for follow-up examination after completed treatment for conditions other than malignant neoplasm: Secondary | ICD-10-CM | POA: Diagnosis not present

## 2019-11-11 DIAGNOSIS — K5792 Diverticulitis of intestine, part unspecified, without perforation or abscess without bleeding: Secondary | ICD-10-CM | POA: Diagnosis not present

## 2019-11-18 ENCOUNTER — Ambulatory Visit
Admission: RE | Admit: 2019-11-18 | Discharge: 2019-11-18 | Disposition: A | Discharge status: Home / Self Care | Payer: Medicare Other | Source: Ambulatory Visit | Attending: Gastroenterology | Admitting: Gastroenterology

## 2019-11-18 ENCOUNTER — Ambulatory Visit (INDEPENDENT_AMBULATORY_CARE_PROVIDER_SITE_OTHER): Payer: Medicare Other | Admitting: *Deleted

## 2019-11-18 DIAGNOSIS — I700 Atherosclerosis of aorta: Secondary | ICD-10-CM | POA: Diagnosis not present

## 2019-11-18 DIAGNOSIS — K5730 Diverticulosis of large intestine without perforation or abscess without bleeding: Secondary | ICD-10-CM | POA: Diagnosis not present

## 2019-11-18 DIAGNOSIS — D3500 Benign neoplasm of unspecified adrenal gland: Secondary | ICD-10-CM | POA: Diagnosis not present

## 2019-11-18 DIAGNOSIS — I428 Other cardiomyopathies: Secondary | ICD-10-CM

## 2019-11-18 DIAGNOSIS — K5792 Diverticulitis of intestine, part unspecified, without perforation or abscess without bleeding: Secondary | ICD-10-CM

## 2019-11-18 DIAGNOSIS — K6389 Other specified diseases of intestine: Secondary | ICD-10-CM | POA: Diagnosis not present

## 2019-11-19 LAB — CUP PACEART REMOTE DEVICE CHECK
Battery Remaining Longevity: 23 mo
Battery Remaining Percentage: 28 %
Battery Voltage: 2.84 V
Brady Statistic AP VP Percent: 3.9 %
Brady Statistic AP VS Percent: 1 %
Brady Statistic AS VP Percent: 95 %
Brady Statistic AS VS Percent: 1 %
Brady Statistic RA Percent Paced: 4.2 %
Date Time Interrogation Session: 20210902001853
HighPow Impedance: 78 Ohm
HighPow Impedance: 78 Ohm
Implantable Lead Implant Date: 20151119
Implantable Lead Implant Date: 20151119
Implantable Lead Implant Date: 20151119
Implantable Lead Location: 753858
Implantable Lead Location: 753859
Implantable Lead Location: 753860
Implantable Pulse Generator Implant Date: 20151119
Lead Channel Impedance Value: 390 Ohm
Lead Channel Impedance Value: 450 Ohm
Lead Channel Impedance Value: 560 Ohm
Lead Channel Pacing Threshold Amplitude: 0.75 V
Lead Channel Pacing Threshold Amplitude: 0.75 V
Lead Channel Pacing Threshold Amplitude: 0.75 V
Lead Channel Pacing Threshold Pulse Width: 0.5 ms
Lead Channel Pacing Threshold Pulse Width: 0.5 ms
Lead Channel Pacing Threshold Pulse Width: 0.5 ms
Lead Channel Sensing Intrinsic Amplitude: 12 mV
Lead Channel Sensing Intrinsic Amplitude: 3.5 mV
Lead Channel Setting Pacing Amplitude: 1.5 V
Lead Channel Setting Pacing Amplitude: 2 V
Lead Channel Setting Pacing Amplitude: 2.5 V
Lead Channel Setting Pacing Pulse Width: 0.5 ms
Lead Channel Setting Pacing Pulse Width: 0.5 ms
Lead Channel Setting Sensing Sensitivity: 0.5 mV
Pulse Gen Serial Number: 7211486

## 2019-11-20 NOTE — Progress Notes (Signed)
Remote ICD transmission.   

## 2019-11-25 ENCOUNTER — Other Ambulatory Visit: Payer: Self-pay | Admitting: Gastroenterology

## 2019-11-25 DIAGNOSIS — K5792 Diverticulitis of intestine, part unspecified, without perforation or abscess without bleeding: Secondary | ICD-10-CM | POA: Diagnosis not present

## 2019-11-26 ENCOUNTER — Telehealth: Payer: Self-pay | Admitting: Nurse Practitioner

## 2019-11-26 ENCOUNTER — Other Ambulatory Visit: Payer: Self-pay | Admitting: Gastroenterology

## 2019-11-26 DIAGNOSIS — M47817 Spondylosis without myelopathy or radiculopathy, lumbosacral region: Secondary | ICD-10-CM | POA: Diagnosis not present

## 2019-11-26 NOTE — Telephone Encounter (Signed)
Patient with diagnosis of afib on Eliquis for anticoagulation.    Procedure: Colonoscopy  Date of procedure: 12/04/19  CHADS2-VASc score of  6 (CHF, HTN, AGE, DM2, CAD, AGE)  CrCl 77 ml/min  Per office protocol, patient can hold Eliquis for 1 day prior to procedure.

## 2019-11-26 NOTE — Progress Notes (Signed)
Attempted to obtain medical history via telephone, unable to reach at this time. I left a voicemail to return pre surgical testing department's phone call.  

## 2019-11-26 NOTE — Telephone Encounter (Signed)
Pharmacy please comment on anticoagulation and I will call the patient back for clearance.  Kerin Ransom PA-C 11/26/2019 11:52 AM

## 2019-11-26 NOTE — Telephone Encounter (Signed)
    [facility] Pre-operative Risk Assessment    HEARTCARE STAFF: - Please ensure there is not already an duplicate clearance open for this procedure. - Under Visit Info/Reason for Call, type in Other and utilize the format Clearance MM/DD/YY or Clearance TBD. Do not use dashes or single digits. - If request is for dental extraction, please clarify the # of teeth to be extracted.  Request for surgical clearance:  1. What type of surgery is being performed? Colonoscopy   2. When is this surgery scheduled? 12/04/19  3. What type of clearance is required (medical clearance vs. Pharmacy clearance to hold med vs. Both)? Pharmacy   4. Are there any medications that need to be held prior to surgery and how long? Elquis   5. Practice name and name of physician performing surgery? [facility] Dr. [name]  6. What is the office phone number? [phone]   7.   What is the office fax number? [phone]  0.   Anesthesia type (None, local, MAC, general) ? Propofol    Jeanelle L Green 11/26/2019, 8:44 AM  _________________________________________________________________   (provider comments below)

## 2019-11-27 ENCOUNTER — Other Ambulatory Visit: Payer: Self-pay

## 2019-11-27 ENCOUNTER — Encounter (HOSPITAL_COMMUNITY): Payer: Self-pay | Admitting: Gastroenterology

## 2019-11-30 NOTE — Telephone Encounter (Signed)
Left message for the patient to call back and speak to the on call preop APP 

## 2019-12-01 ENCOUNTER — Other Ambulatory Visit (HOSPITAL_COMMUNITY)
Admission: RE | Admit: 2019-12-01 | Discharge: 2019-12-01 | Disposition: A | Discharge status: Home / Self Care | Payer: Medicare Other | Source: Ambulatory Visit | Attending: Gastroenterology | Admitting: Gastroenterology

## 2019-12-01 DIAGNOSIS — Z20822 Contact with and (suspected) exposure to covid-19: Secondary | ICD-10-CM | POA: Diagnosis not present

## 2019-12-01 DIAGNOSIS — Z01812 Encounter for preprocedural laboratory examination: Secondary | ICD-10-CM | POA: Insufficient documentation

## 2019-12-01 LAB — SARS CORONAVIRUS 2 (TAT 6-24 HRS): SARS Coronavirus 2: NEGATIVE

## 2019-12-01 NOTE — Telephone Encounter (Signed)
Left message for the patient to call back and speak to the preop APP. 

## 2019-12-02 DIAGNOSIS — Z79899 Other long term (current) drug therapy: Secondary | ICD-10-CM | POA: Diagnosis not present

## 2019-12-02 DIAGNOSIS — M545 Low back pain: Secondary | ICD-10-CM | POA: Diagnosis not present

## 2019-12-02 DIAGNOSIS — Z79891 Long term (current) use of opiate analgesic: Secondary | ICD-10-CM | POA: Diagnosis not present

## 2019-12-02 DIAGNOSIS — M47817 Spondylosis without myelopathy or radiculopathy, lumbosacral region: Secondary | ICD-10-CM | POA: Diagnosis not present

## 2019-12-02 DIAGNOSIS — G894 Chronic pain syndrome: Secondary | ICD-10-CM | POA: Diagnosis not present

## 2019-12-02 DIAGNOSIS — M25559 Pain in unspecified hip: Secondary | ICD-10-CM | POA: Diagnosis not present

## 2019-12-02 NOTE — Telephone Encounter (Signed)
Pt called asking for Preop APP

## 2019-12-02 NOTE — Telephone Encounter (Signed)
   Primary Cardiologist: Loralie Champagne, MD  Chart reviewed as part of pre-operative protocol coverage.   Per pharmacy recommendations, patient can hold eliquis 1 day prior to his upcoming colonoscopy with plans to restart as soon as he is cleared to do so by his gastroenterologist.  I will route this recommendation to the requesting party via [facility] fax function and remove from pre-op pool.  Please call with questions.  Abigail Butts, PA-C 12/02/2019, 8:44 AM

## 2019-12-04 ENCOUNTER — Encounter (HOSPITAL_COMMUNITY)
Admission: RE | Disposition: A | Discharge status: Home / Self Care | Payer: Self-pay | Source: Home / Self Care | Attending: Gastroenterology

## 2019-12-04 ENCOUNTER — Encounter (HOSPITAL_COMMUNITY): Payer: Self-pay | Admitting: Gastroenterology

## 2019-12-04 ENCOUNTER — Ambulatory Visit (HOSPITAL_COMMUNITY)
Admission: RE | Admit: 2019-12-04 | Discharge: 2019-12-04 | Disposition: A | Discharge status: Home / Self Care | Payer: Medicare Other | Attending: Gastroenterology | Admitting: Gastroenterology

## 2019-12-04 ENCOUNTER — Ambulatory Visit (HOSPITAL_COMMUNITY): Payer: Medicare Other | Admitting: Anesthesiology

## 2019-12-04 DIAGNOSIS — I2510 Atherosclerotic heart disease of native coronary artery without angina pectoris: Secondary | ICD-10-CM | POA: Insufficient documentation

## 2019-12-04 DIAGNOSIS — I4891 Unspecified atrial fibrillation: Secondary | ICD-10-CM | POA: Insufficient documentation

## 2019-12-04 DIAGNOSIS — I429 Cardiomyopathy, unspecified: Secondary | ICD-10-CM | POA: Insufficient documentation

## 2019-12-04 DIAGNOSIS — K5730 Diverticulosis of large intestine without perforation or abscess without bleeding: Secondary | ICD-10-CM | POA: Insufficient documentation

## 2019-12-04 DIAGNOSIS — Z6831 Body mass index (BMI) 31.0-31.9, adult: Secondary | ICD-10-CM | POA: Diagnosis not present

## 2019-12-04 DIAGNOSIS — K5732 Diverticulitis of large intestine without perforation or abscess without bleeding: Secondary | ICD-10-CM | POA: Diagnosis not present

## 2019-12-04 DIAGNOSIS — I10 Essential (primary) hypertension: Secondary | ICD-10-CM | POA: Diagnosis not present

## 2019-12-04 DIAGNOSIS — E119 Type 2 diabetes mellitus without complications: Secondary | ICD-10-CM | POA: Diagnosis not present

## 2019-12-04 DIAGNOSIS — Z950 Presence of cardiac pacemaker: Secondary | ICD-10-CM | POA: Insufficient documentation

## 2019-12-04 DIAGNOSIS — E669 Obesity, unspecified: Secondary | ICD-10-CM | POA: Diagnosis not present

## 2019-12-04 DIAGNOSIS — R933 Abnormal findings on diagnostic imaging of other parts of digestive tract: Secondary | ICD-10-CM | POA: Diagnosis not present

## 2019-12-04 HISTORY — DX: Presence of spectacles and contact lenses: Z97.3

## 2019-12-04 HISTORY — PX: COLONOSCOPY WITH PROPOFOL: SHX5780

## 2019-12-04 LAB — GLUCOSE, CAPILLARY: Glucose-Capillary: 142 mg/dL — ABNORMAL HIGH (ref 70–99)

## 2019-12-04 SURGERY — COLONOSCOPY WITH PROPOFOL
Anesthesia: Monitor Anesthesia Care

## 2019-12-04 MED ORDER — LIDOCAINE 2% (20 MG/ML) 5 ML SYRINGE
INTRAMUSCULAR | Status: DC | PRN
  Administered 2019-12-04 (×2): 40 mg via INTRAVENOUS
  Administered 2019-12-04: 20 mg via INTRAVENOUS

## 2019-12-04 MED ORDER — PHENYLEPHRINE 40 MCG/ML (10ML) SYRINGE FOR IV PUSH (FOR BLOOD PRESSURE SUPPORT)
PREFILLED_SYRINGE | INTRAVENOUS | Status: DC | PRN
  Administered 2019-12-04: 120 ug via INTRAVENOUS

## 2019-12-04 MED ORDER — CARVEDILOL 12.5 MG PO TABS
12.5000 mg | ORAL_TABLET | Freq: Once | ORAL | Status: AC
  Administered 2019-12-04: 12.5 mg via ORAL
  Filled 2019-12-04: qty 1

## 2019-12-04 MED ORDER — PROPOFOL 10 MG/ML IV BOLUS
INTRAVENOUS | Status: DC | PRN
  Administered 2019-12-04: 40 mg via INTRAVENOUS
  Administered 2019-12-04: 20 mg via INTRAVENOUS
  Administered 2019-12-04 (×2): 40 mg via INTRAVENOUS
  Administered 2019-12-04 (×2): 20 mg via INTRAVENOUS
  Administered 2019-12-04 (×2): 40 mg via INTRAVENOUS

## 2019-12-04 MED ORDER — LACTATED RINGERS IV SOLN
INTRAVENOUS | Status: AC | PRN
  Administered 2019-12-04: 1000 mL via INTRAVENOUS

## 2019-12-04 MED ORDER — SODIUM CHLORIDE 0.9 % IV SOLN: INTRAVENOUS | Status: DC

## 2019-12-04 SURGICAL SUPPLY — 21 items

## 2019-12-04 NOTE — Transfer of Care (Signed)
Immediate Anesthesia Transfer of Care Note  Patient: Walter [name]  Procedure(s) Performed: COLONOSCOPY WITH PROPOFOL (N/A )  Patient Location: PACU and Endoscopy Unit  Anesthesia Type:MAC  Level of Consciousness: awake and alert   Airway & Oxygen Therapy: Patient Spontanous Breathing and Patient connected to face mask oxygen  Post-op Assessment: Report given to RN and Post -op Vital signs reviewed and stable  Post vital signs: Reviewed and stable  Last Vitals:  Vitals Value Taken Time  BP 118/73 12/04/19 1235  Temp    Pulse 75 12/04/19 1238  Resp 15 12/04/19 1238  SpO2 94 % 12/04/19 1238  Vitals shown include unvalidated device data.  Last Pain:  Vitals:   12/04/19 1007  TempSrc: Oral  PainSc: 0-No pain         Complications: No complications documented.

## 2019-12-04 NOTE — Anesthesia Procedure Notes (Signed)
Procedure Name: MAC Date/Time: 12/04/2019 11:55 AM Performed by: Cynda Familia, CRNA Pre-anesthesia Checklist: Patient identified, Emergency Drugs available, Suction available, Patient being monitored and Timeout performed Patient Re-evaluated:Patient Re-evaluated prior to induction Oxygen Delivery Method: Simple face mask Placement Confirmation: positive ETCO2 and breath sounds checked- equal and bilateral Dental Injury: Teeth and Oropharynx as per pre-operative assessment

## 2019-12-04 NOTE — Anesthesia Preprocedure Evaluation (Addendum)
Anesthesia Evaluation  Patient identified by MRN, date of birth, ID band Patient awake    Reviewed: Allergy & Precautions, NPO status , Patient's Chart, lab work & pertinent test results, reviewed documented beta blocker date and time   History of Anesthesia Complications (+) PONV and history of anesthetic complications  Airway Mallampati: II  TM Distance: >3 FB Neck ROM: Full    Dental  (+) Caps, Dental Advisory Given   Pulmonary neg pulmonary ROS, former smoker,    Pulmonary exam normal        Cardiovascular hypertension, Pt. on home beta blockers + CAD and + Past MI  Normal cardiovascular exam+ dysrhythmias Atrial Fibrillation + Cardiac Defibrillator  Rhythm:Regular  Nonischemic cardiolyopathy  IMPRESSIONS    1. Left ventricular ejection fraction, by estimation, is 55%. The left  ventricle has normal function. The left ventricle has no regional wall  motion abnormalities. There is mild left ventricular hypertrophy. Left  ventricular diastolic parameters are  consistent with Grade I diastolic dysfunction (impaired relaxation).  2. Right ventricular systolic function is normal. The right ventricular  size is normal. There is normal pulmonary artery systolic pressure. The  estimated right ventricular systolic pressure is 71.1 mmHg.  3. The mitral valve is normal in structure. No evidence of mitral valve  regurgitation. No evidence of mitral stenosis.  4. The aortic valve is tricuspid. Aortic valve regurgitation is not  visualized. Mild aortic valve sclerosis is present, with no evidence of  aortic valve stenosis.  5. The inferior vena cava is dilated in size with >50% respiratory  variability, suggesting right atrial pressure of 8 mmHg.   Comparison(s): 02/21/18 EF 50-55%.    Neuro/Psych PSYCHIATRIC DISORDERS Anxiety negative neurological ROS     GI/Hepatic negative GI ROS, Neg liver ROS,   Endo/Other   diabetes  Renal/GU negative Renal ROS     Musculoskeletal negative musculoskeletal ROS (+)   Abdominal   Peds  Hematology  (+) Blood dyscrasia, ,   Anesthesia Other Findings   Reproductive/Obstetrics                            Anesthesia Physical Anesthesia Plan  ASA: III  Anesthesia Plan: MAC   Post-op Pain Management:    Induction:   PONV Risk Score and Plan: 2 and Treatment may vary due to age or medical condition  Airway Management Planned: Natural Airway and Simple Face Mask  Additional Equipment: None  Intra-op Plan:   Post-operative Plan:   Informed Consent: I have reviewed the patients History and Physical, chart, labs and discussed the procedure including the risks, benefits and alternatives for the proposed anesthesia with the patient or authorized representative who has indicated his/her understanding and acceptance.     Dental advisory given  Plan Discussed with: CRNA and Surgeon  Anesthesia Plan Comments: (Consent by Dr. Tobias Alexander)       Anesthesia Quick Evaluation

## 2019-12-04 NOTE — Op Note (Signed)
[facility] Patient Name: Walter [name] Procedure Date: 12/04/2019 MRN: [id number] Attending MD: Wonda Horner , MD Date of Birth: [birth year] CSN: [id number] Age: 74 Admit Type: Inpatient Procedure:                Colonoscopy Indications:              Follow-up of diverticulitis Providers:                Wonda Horner, MD, Carmie End, RN, Ladona Ridgel, Technician Referring MD:              Medicines:                Propofol per Anesthesia Complications:            No immediate complications. Estimated Blood Loss:     Estimated blood loss: none. Procedure:                Pre-Anesthesia Assessment:                           - Prior to the procedure, a History and Physical                            was performed, and patient medications and                            allergies were reviewed. The patient's tolerance of                            previous anesthesia was also reviewed. The risks                            and benefits of the procedure and the sedation                            options and risks were discussed with the patient.                            All questions were answered, and informed consent                            was obtained. Prior Anticoagulants: The patient has                            taken Eliquis (apixaban), last dose was 2 days                            prior to procedure. ASA Grade Assessment: III - A                            patient with severe systemic disease. After  reviewing the risks and benefits, the patient was                            deemed in satisfactory condition to undergo the                            procedure.                           After obtaining informed consent, the colonoscope                            was passed under direct vision. Throughout the                            procedure, the patient's blood pressure, pulse, and                             oxygen saturations were monitored continuously. The                            PCF-[id number] ([id number]) Olympus pediatric colonscope                            was introduced through the anus and advanced to the                            the cecum, identified by appendiceal orifice and                            ileocecal valve. The ileocecal valve, appendiceal                            orifice, and rectum were photographed. The                            colonoscopy was performed without difficulty. The                            patient tolerated the procedure well. The quality                            of the bowel preparation was adequate. Scope In: 12:04:35 PM Scope Out: 12:19:45 PM Scope Withdrawal Time: 0 hours 8 minutes 21 seconds  Total Procedure Duration: 0 hours 15 minutes 10 seconds  Findings:      The perianal and digital rectal examinations were normal.      Multiple small and large-mouthed diverticula were found in the sigmoid       colon and descending colon.      The exam was otherwise without abnormality. Impression:               - Diverticulosis in the sigmoid colon and in the  descending colon.                           - The examination was otherwise normal.                           - No specimens collected. Moderate Sedation:      . Recommendation:           - Resume regular diet.                           - Continue present medications.                           - Repeat colonoscopy is not recommended for                            screening purposes. Procedure Code(s):        --- Professional ---                           [phone], Colonoscopy, flexible; diagnostic, including                            collection of specimen(s) by brushing or washing,                            when performed (separate procedure) Diagnosis Code(s):        --- Professional ---                           O03.70, Diverticulitis of large  intestine without                            perforation or abscess without bleeding                           K57.30, Diverticulosis of large intestine without                            perforation or abscess without bleeding CPT copyright 2019 American [employer] Association. All rights reserved. The codes documented in this report are preliminary and upon coder review may  be revised to meet current compliance requirements. Wonda Horner, MD 12/04/2019 12:28:06 PM This report has been signed electronically. Number of Addenda: 0

## 2019-12-04 NOTE — Discharge Instructions (Signed)

## 2019-12-04 NOTE — H&P (Signed)
The patient is a 74 year old male here today for colonoscopy.  Most recently seen in the office on September 8 and H&P is scanned to chart for endoscopy.  He had recent diverticulitis.  We did a follow-up virtual colonoscopy but it was unable to tell us whether or not the findings in the sigmoid colon were simply related to diverticular disease of the colon or underlying mass and therefore we decided to proceed with colonoscopy.  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

## 2019-12-04 NOTE — Anesthesia Postprocedure Evaluation (Signed)
Anesthesia Post Note  Patient: Walter [name]  Procedure(s) Performed: COLONOSCOPY WITH PROPOFOL (N/A )     Patient location during evaluation: Endoscopy Anesthesia Type: MAC Level of consciousness: awake and alert, patient cooperative and oriented Pain management: pain level controlled Vital Signs Assessment: post-procedure vital signs reviewed and stable Respiratory status: spontaneous breathing, nonlabored ventilation and respiratory function stable Cardiovascular status: blood pressure returned to baseline and stable Postop Assessment: no apparent nausea or vomiting Anesthetic complications: no   No complications documented.  Last Vitals:  Vitals:   12/04/19 1250 12/04/19 1300  BP: 134/88 (!) 139/93  Pulse: 71 71  Resp: 16 16  Temp:    SpO2: 93% 94%    Last Pain:  Vitals:   12/04/19 1300  TempSrc:   PainSc: 0-No pain                 Roy Tokarz,E. Yisel Megill

## 2019-12-07 ENCOUNTER — Ambulatory Visit (INDEPENDENT_AMBULATORY_CARE_PROVIDER_SITE_OTHER): Payer: Medicare Other

## 2019-12-07 DIAGNOSIS — I5022 Chronic systolic (congestive) heart failure: Secondary | ICD-10-CM

## 2019-12-07 DIAGNOSIS — Z95810 Presence of automatic (implantable) cardiac defibrillator: Secondary | ICD-10-CM

## 2019-12-08 ENCOUNTER — Encounter (HOSPITAL_COMMUNITY): Payer: Self-pay | Admitting: Gastroenterology

## 2019-12-08 NOTE — Progress Notes (Signed)
Take Lasix 3 days in a row then back to qod.

## 2019-12-08 NOTE — Progress Notes (Signed)
EPIC Encounter for ICM Monitoring  Patient Name: Walter [name] is a 74 y.o. male Date: 12/08/2019 Primary Care Physican: London Pepper, MD Primary Cardiologist:[place] Electrophysiologist: Allred Bi-V Pacing:99% 9/21/2021Weight:239lbs   Spoke with patient and reports feeling well at this time. Denies fluid symptoms. He reports the fluid accumulation if from the colonosocpy prep  CorVuethoracic impedancesuggesting possible fluid accumulation starting 12/02/2019 but is trending back toward baseline which correlates with colonoscopy prep & procedures on 12/04/2019.   Furosemide20 mgTake 1 tablet (20 mg total) bymouthevery other day.  Labs: 08/25/2019 Creatinine 1.13, BUN 16, Potassium 4.1, Sodium 137, GFR >60 08/10/2019 Creatinine 1.48, BUN 38, Potassium 5.5, Sodium 135, GFR 46-53 07/03/2019 Creatinine 1.09, BUN 23, Potassium 4.6, Sodium 136, GFR 66-76 A complete set of results can be found in Results Review.  Recommendations:No changes and encouraged to call if experiencing any fluid symptoms.  Follow-up plan: ICM clinic phone appointment on9/29/2021 to recheck fluid levels.91 day device clinic remote transmission9/03/2019.  EP/Cardiology Office Visits:02/10/2020 with Dr Aundra Dubin will be 6 month follow up but no appt scheduled. Recall 4/16/2022with Dr. Rayann Heman.   Copy of ICM check sent to Dr.Allred.   3 month ICM trend: 12/08/2019    1 Year ICM trend:       Rosalene Billings, RN 12/08/2019 10:12 AM

## 2019-12-09 NOTE — Progress Notes (Signed)
Spoke with patient.  Advised to take Lasix 20 mg 3 days in a row and then resume every other day.  He verbalized understanding and agreed to plan.

## 2019-12-16 ENCOUNTER — Telehealth: Payer: Self-pay

## 2019-12-16 ENCOUNTER — Ambulatory Visit: Payer: Medicare Other

## 2019-12-16 DIAGNOSIS — I5022 Chronic systolic (congestive) heart failure: Secondary | ICD-10-CM

## 2019-12-16 DIAGNOSIS — Z95810 Presence of automatic (implantable) cardiac defibrillator: Secondary | ICD-10-CM

## 2019-12-16 NOTE — Progress Notes (Signed)
EPIC Encounter for ICM Monitoring  Patient Name: Walter [name] is a 74 y.o. male Date: 12/16/2019 Primary Care Physican: London Pepper, MD Primary Cardiologist:[place] Electrophysiologist: Allred Bi-V Pacing:99% 9/21/2021Weight:239lbs   Attempted call to patient and unable to reach.  Left detailed message per DPR regarding transmission. Transmission reviewed.   CorVuethoracic impedancesuggesting fluid levels returned to normal after taking 20 mg Furosemide 3 consecutive days.  Furosemide20 mgTake 1 tablet (20 mg total) bymouthevery other day.  Labs: 08/25/2019 Creatinine 1.13, BUN 16, Potassium 4.1, Sodium 137, GFR >60 08/10/2019 Creatinine 1.48, BUN 38, Potassium 5.5, Sodium 135, GFR 46-53 07/03/2019 Creatinine 1.09, BUN 23, Potassium 4.6, Sodium 136, GFR 66-76 A complete set of results can be found in Results Review.  Recommendations:Left voice mail with ICM number and encouraged to call if experiencing any fluid symptoms.  Follow-up plan: ICM clinic phone appointment on10/25/2021.91 day device clinic remote transmission12/03/2019.  EP/Cardiology Office Visits:02/10/2020 with Dr Aundra Dubin will be 6 month follow up but no appt scheduled. Recall 4/16/2022with Dr. Rayann Heman.   Copy of ICM check sent to Dr.Allred.  3 month ICM trend: 12/16/2019    1 Year ICM trend:       Rosalene Billings, RN 12/16/2019 1:21 PM

## 2019-12-16 NOTE — Telephone Encounter (Signed)
Remote ICM transmission received.  Attempted call to patient regarding ICM remote transmission and left detailed message per DPR.  Advised to return call for any fluid symptoms or questions. Next ICM remote transmission scheduled 01/11/2020.   ° ° °

## 2019-12-22 DIAGNOSIS — Z23 Encounter for immunization: Secondary | ICD-10-CM | POA: Diagnosis not present

## 2019-12-28 DIAGNOSIS — G8929 Other chronic pain: Secondary | ICD-10-CM | POA: Diagnosis not present

## 2019-12-28 DIAGNOSIS — E1169 Type 2 diabetes mellitus with other specified complication: Secondary | ICD-10-CM | POA: Diagnosis not present

## 2019-12-28 DIAGNOSIS — Z23 Encounter for immunization: Secondary | ICD-10-CM | POA: Diagnosis not present

## 2019-12-28 DIAGNOSIS — N529 Male erectile dysfunction, unspecified: Secondary | ICD-10-CM | POA: Diagnosis not present

## 2019-12-28 DIAGNOSIS — I10 Essential (primary) hypertension: Secondary | ICD-10-CM | POA: Diagnosis not present

## 2019-12-28 DIAGNOSIS — E785 Hyperlipidemia, unspecified: Secondary | ICD-10-CM | POA: Diagnosis not present

## 2019-12-28 DIAGNOSIS — K219 Gastro-esophageal reflux disease without esophagitis: Secondary | ICD-10-CM | POA: Diagnosis not present

## 2019-12-28 DIAGNOSIS — E291 Testicular hypofunction: Secondary | ICD-10-CM | POA: Diagnosis not present

## 2019-12-30 DIAGNOSIS — M47817 Spondylosis without myelopathy or radiculopathy, lumbosacral region: Secondary | ICD-10-CM | POA: Diagnosis not present

## 2019-12-30 DIAGNOSIS — G894 Chronic pain syndrome: Secondary | ICD-10-CM | POA: Diagnosis not present

## 2019-12-30 DIAGNOSIS — M25559 Pain in unspecified hip: Secondary | ICD-10-CM | POA: Diagnosis not present

## 2019-12-30 DIAGNOSIS — M1710 Unilateral primary osteoarthritis, unspecified knee: Secondary | ICD-10-CM | POA: Diagnosis not present

## 2020-01-14 ENCOUNTER — Telehealth: Payer: Self-pay

## 2020-01-14 NOTE — Telephone Encounter (Signed)
LMOVM for pt to send missed transmission. 

## 2020-01-18 ENCOUNTER — Ambulatory Visit (INDEPENDENT_AMBULATORY_CARE_PROVIDER_SITE_OTHER): Payer: Medicare Other

## 2020-01-18 DIAGNOSIS — Z95810 Presence of automatic (implantable) cardiac defibrillator: Secondary | ICD-10-CM | POA: Diagnosis not present

## 2020-01-18 DIAGNOSIS — I5022 Chronic systolic (congestive) heart failure: Secondary | ICD-10-CM | POA: Diagnosis not present

## 2020-01-22 NOTE — Progress Notes (Signed)
EPIC Encounter for ICM Monitoring  Patient Name: Walter [name] is a 74 y.o. male Date: 01/22/2020 Primary Care Physican: London Pepper, MD Primary [place] Electrophysiologist: Allred Bi-V Pacing:>99% 11/5/2021Weight:244lbs   Spoke with patient and reports feeling well at this time.  Denies fluid symptoms.    CorVuethoracic impedancenormal but was suggesting possible fluid accumulation from 10/15-10/23.  Furosemide20 mgTake 1 tablet (20 mg total) bymouthevery other day.  Labs: 08/25/2019 Creatinine 1.13, BUN 16, Potassium 4.1, Sodium 137, GFR >60 08/10/2019 Creatinine 1.48, BUN 38, Potassium 5.5, Sodium 135, GFR 46-53 07/03/2019 Creatinine 1.09, BUN 23, Potassium 4.6, Sodium 136, GFR 66-76 A complete set of results can be found in Results Review.  Recommendations: No changes and encouraged to call if experiencing any fluid symptoms.  Follow-up plan: ICM clinic phone appointment on12/04/2019.91 day device clinic remote transmission12/03/2019.  EP/Cardiology Office Visits:Advised 6 month follow up with Dr Aundra Dubin is due this month and give the office a call. Recall 4/16/2022with Dr. Rayann Heman.   Copy of ICM check sent to Dr.Allred.   3 month ICM trend: 01/19/2020    1 Year ICM trend:       Rosalene Billings, RN 01/22/2020 4:16 PM

## 2020-01-29 DIAGNOSIS — M47817 Spondylosis without myelopathy or radiculopathy, lumbosacral region: Secondary | ICD-10-CM | POA: Diagnosis not present

## 2020-01-29 DIAGNOSIS — G894 Chronic pain syndrome: Secondary | ICD-10-CM | POA: Diagnosis not present

## 2020-01-29 DIAGNOSIS — M25559 Pain in unspecified hip: Secondary | ICD-10-CM | POA: Diagnosis not present

## 2020-02-15 ENCOUNTER — Other Ambulatory Visit (HOSPITAL_COMMUNITY): Payer: Self-pay | Admitting: *Deleted

## 2020-02-15 DIAGNOSIS — I5022 Chronic systolic (congestive) heart failure: Secondary | ICD-10-CM

## 2020-02-18 ENCOUNTER — Ambulatory Visit (INDEPENDENT_AMBULATORY_CARE_PROVIDER_SITE_OTHER): Payer: Medicare Other

## 2020-02-18 DIAGNOSIS — I5022 Chronic systolic (congestive) heart failure: Secondary | ICD-10-CM

## 2020-02-18 DIAGNOSIS — Z95810 Presence of automatic (implantable) cardiac defibrillator: Secondary | ICD-10-CM | POA: Diagnosis not present

## 2020-02-18 LAB — CUP PACEART REMOTE DEVICE CHECK
Battery Remaining Longevity: 19 mo
Battery Remaining Percentage: 23 %
Battery Voltage: 2.8 V
Brady Statistic AP VP Percent: 5.3 %
Brady Statistic AP VS Percent: 1 %
Brady Statistic AS VP Percent: 94 %
Brady Statistic AS VS Percent: 1 %
Brady Statistic RA Percent Paced: 5.7 %
Date Time Interrogation Session: 20211202132447
HighPow Impedance: 75 Ohm
HighPow Impedance: 75 Ohm
Implantable Lead Implant Date: 20151119
Implantable Lead Implant Date: 20151119
Implantable Lead Implant Date: 20151119
Implantable Lead Location: 753858
Implantable Lead Location: 753859
Implantable Lead Location: 753860
Implantable Pulse Generator Implant Date: 20151119
Lead Channel Impedance Value: 380 Ohm
Lead Channel Impedance Value: 440 Ohm
Lead Channel Impedance Value: 550 Ohm
Lead Channel Pacing Threshold Amplitude: 0.75 V
Lead Channel Pacing Threshold Amplitude: 0.75 V
Lead Channel Pacing Threshold Amplitude: 0.75 V
Lead Channel Pacing Threshold Pulse Width: 0.5 ms
Lead Channel Pacing Threshold Pulse Width: 0.5 ms
Lead Channel Pacing Threshold Pulse Width: 0.5 ms
Lead Channel Sensing Intrinsic Amplitude: 1.8 mV
Lead Channel Sensing Intrinsic Amplitude: 12 mV
Lead Channel Setting Pacing Amplitude: 1.5 V
Lead Channel Setting Pacing Amplitude: 2 V
Lead Channel Setting Pacing Amplitude: 2.5 V
Lead Channel Setting Pacing Pulse Width: 0.5 ms
Lead Channel Setting Pacing Pulse Width: 0.5 ms
Lead Channel Setting Sensing Sensitivity: 0.5 mV
Pulse Gen Serial Number: 7211486

## 2020-02-19 DIAGNOSIS — T8484XA Pain due to internal orthopedic prosthetic devices, implants and grafts, initial encounter: Secondary | ICD-10-CM | POA: Diagnosis not present

## 2020-02-19 NOTE — Progress Notes (Signed)
EPIC Encounter for ICM Monitoring  Patient Name: Walter [name] is a 74 y.o. male Date: 02/19/2020 Primary Care Physican: London Pepper, MD Primary Cardiologist:[place] Electrophysiologist: Allred Bi-V Pacing:>99% 11/5/2021Weight:244lbs   Transmission reviewed.  CorVuethoracic impedancenormal.  Furosemide20 mgTake 1 tablet (20 mg total) bymouthevery other day.  Labs: 08/25/2019 Creatinine 1.13, BUN 16, Potassium 4.1, Sodium 137, GFR >60 08/10/2019 Creatinine 1.48, BUN 38, Potassium 5.5, Sodium 135, GFR 46-53 07/03/2019 Creatinine 1.09, BUN 23, Potassium 4.6, Sodium 136, GFR 66-76 A complete set of results can be found in Results Review.  Recommendations: No changes.  Follow-up plan: ICM clinic phone appointment on1/05/2020.91 day device clinic remote transmission3/04/2020.  EP/Cardiology Office Visits:04/15/2020 with Dr Aundra Dubin. Recall 4/16/2022with Dr. Rayann Heman.   Copy of ICM check sent to Dr.Allred.   3 month ICM trend: 02/18/2020    1 Year ICM trend:       Rosalene Billings, RN 02/19/2020 5:08 PM

## 2020-02-29 DIAGNOSIS — M1990 Unspecified osteoarthritis, unspecified site: Secondary | ICD-10-CM | POA: Diagnosis not present

## 2020-02-29 DIAGNOSIS — Z79891 Long term (current) use of opiate analgesic: Secondary | ICD-10-CM | POA: Diagnosis not present

## 2020-02-29 DIAGNOSIS — M25559 Pain in unspecified hip: Secondary | ICD-10-CM | POA: Diagnosis not present

## 2020-02-29 DIAGNOSIS — M47817 Spondylosis without myelopathy or radiculopathy, lumbosacral region: Secondary | ICD-10-CM | POA: Diagnosis not present

## 2020-02-29 DIAGNOSIS — Z79899 Other long term (current) drug therapy: Secondary | ICD-10-CM | POA: Diagnosis not present

## 2020-02-29 DIAGNOSIS — G894 Chronic pain syndrome: Secondary | ICD-10-CM | POA: Diagnosis not present

## 2020-03-21 ENCOUNTER — Ambulatory Visit (INDEPENDENT_AMBULATORY_CARE_PROVIDER_SITE_OTHER): Payer: Medicare Other

## 2020-03-21 DIAGNOSIS — Z95810 Presence of automatic (implantable) cardiac defibrillator: Secondary | ICD-10-CM | POA: Diagnosis not present

## 2020-03-21 DIAGNOSIS — I5022 Chronic systolic (congestive) heart failure: Secondary | ICD-10-CM

## 2020-03-25 NOTE — Progress Notes (Signed)
EPIC Encounter for ICM Monitoring  Patient Name: Walter [name] is a 75 y.o. male Date: 03/25/2020 Primary Care Physican: London Pepper, MD Primary [place] Electrophysiologist: Allred Bi-V Pacing:>99% 1/7/2022Weight:243-244lbs   Spoke with patient and reports feeling well at this time.  Denies fluid symptoms.    CorVuethoracic impedancenormal.  Furosemide20 mgTake 1 tablet (20 mg total) bymouthevery other day.  Labs: 08/25/2019 Creatinine 1.13, BUN 16, Potassium 4.1, Sodium 137, GFR >60 08/10/2019 Creatinine 1.48, BUN 38, Potassium 5.5, Sodium 135, GFR 46-53 07/03/2019 Creatinine 1.09, BUN 23, Potassium 4.6, Sodium 136, GFR 66-76 A complete set of results can be found in Results Review.  Recommendations:No changes and encouraged to call if experiencing any fluid symptoms.  Follow-up plan: ICM clinic phone appointment on2/10/2020.91 day device clinic remote transmission3/04/2020.  EP/Cardiology Office Visits:04/15/2020 withDr Aundra Dubin. Recall 4/16/2022with Dr. Rayann Heman.   Copy of ICM check sent to Dr.Allred.  3 month ICM trend: 03/25/2020.    1 Year ICM trend:       Rosalene Billings, RN 03/25/2020 10:57 AM

## 2020-03-28 DIAGNOSIS — G894 Chronic pain syndrome: Secondary | ICD-10-CM | POA: Diagnosis not present

## 2020-03-28 DIAGNOSIS — M1710 Unilateral primary osteoarthritis, unspecified knee: Secondary | ICD-10-CM | POA: Diagnosis not present

## 2020-03-28 DIAGNOSIS — M47817 Spondylosis without myelopathy or radiculopathy, lumbosacral region: Secondary | ICD-10-CM | POA: Diagnosis not present

## 2020-03-28 DIAGNOSIS — M25559 Pain in unspecified hip: Secondary | ICD-10-CM | POA: Diagnosis not present

## 2020-04-14 ENCOUNTER — Other Ambulatory Visit (HOSPITAL_COMMUNITY): Payer: Self-pay | Admitting: Cardiology

## 2020-04-15 ENCOUNTER — Ambulatory Visit (HOSPITAL_COMMUNITY)
Admission: RE | Admit: 2020-04-15 | Discharge: 2020-04-15 | Disposition: A | Discharge status: Home / Self Care | Payer: Medicare Other | Source: Ambulatory Visit | Attending: Cardiology | Admitting: Cardiology

## 2020-04-15 ENCOUNTER — Other Ambulatory Visit: Payer: Self-pay

## 2020-04-15 ENCOUNTER — Encounter (HOSPITAL_COMMUNITY): Payer: Self-pay | Admitting: Cardiology

## 2020-04-15 ENCOUNTER — Ambulatory Visit (HOSPITAL_BASED_OUTPATIENT_CLINIC_OR_DEPARTMENT_OTHER)
Admission: RE | Admit: 2020-04-15 | Discharge: 2020-04-15 | Disposition: A | Discharge status: Home / Self Care | Payer: Medicare Other | Source: Ambulatory Visit | Attending: Cardiology | Admitting: Cardiology

## 2020-04-15 VITALS — BP 122/80 | HR 74 | Wt 255.4 lb

## 2020-04-15 DIAGNOSIS — Z87891 Personal history of nicotine dependence: Secondary | ICD-10-CM | POA: Diagnosis not present

## 2020-04-15 DIAGNOSIS — K219 Gastro-esophageal reflux disease without esophagitis: Secondary | ICD-10-CM | POA: Diagnosis not present

## 2020-04-15 DIAGNOSIS — G250 Essential tremor: Secondary | ICD-10-CM

## 2020-04-15 DIAGNOSIS — I4891 Unspecified atrial fibrillation: Secondary | ICD-10-CM

## 2020-04-15 DIAGNOSIS — I429 Cardiomyopathy, unspecified: Secondary | ICD-10-CM | POA: Insufficient documentation

## 2020-04-15 DIAGNOSIS — I480 Paroxysmal atrial fibrillation: Secondary | ICD-10-CM | POA: Insufficient documentation

## 2020-04-15 DIAGNOSIS — I110 Hypertensive heart disease with heart failure: Secondary | ICD-10-CM | POA: Insufficient documentation

## 2020-04-15 DIAGNOSIS — I5022 Chronic systolic (congestive) heart failure: Secondary | ICD-10-CM

## 2020-04-15 DIAGNOSIS — G2581 Restless legs syndrome: Secondary | ICD-10-CM | POA: Diagnosis not present

## 2020-04-15 DIAGNOSIS — Z7901 Long term (current) use of anticoagulants: Secondary | ICD-10-CM | POA: Insufficient documentation

## 2020-04-15 DIAGNOSIS — E119 Type 2 diabetes mellitus without complications: Secondary | ICD-10-CM | POA: Diagnosis not present

## 2020-04-15 DIAGNOSIS — Z79899 Other long term (current) drug therapy: Secondary | ICD-10-CM | POA: Insufficient documentation

## 2020-04-15 DIAGNOSIS — Z8249 Family history of ischemic heart disease and other diseases of the circulatory system: Secondary | ICD-10-CM | POA: Insufficient documentation

## 2020-04-15 DIAGNOSIS — D6851 Activated protein C resistance: Secondary | ICD-10-CM | POA: Diagnosis not present

## 2020-04-15 DIAGNOSIS — I519 Heart disease, unspecified: Secondary | ICD-10-CM | POA: Diagnosis not present

## 2020-04-15 DIAGNOSIS — I447 Left bundle-branch block, unspecified: Secondary | ICD-10-CM | POA: Insufficient documentation

## 2020-04-15 DIAGNOSIS — I4819 Other persistent atrial fibrillation: Secondary | ICD-10-CM

## 2020-04-15 DIAGNOSIS — Z950 Presence of cardiac pacemaker: Secondary | ICD-10-CM | POA: Diagnosis not present

## 2020-04-15 DIAGNOSIS — E785 Hyperlipidemia, unspecified: Secondary | ICD-10-CM | POA: Diagnosis not present

## 2020-04-15 DIAGNOSIS — Z7984 Long term (current) use of oral hypoglycemic drugs: Secondary | ICD-10-CM | POA: Insufficient documentation

## 2020-04-15 DIAGNOSIS — I2510 Atherosclerotic heart disease of native coronary artery without angina pectoris: Secondary | ICD-10-CM | POA: Insufficient documentation

## 2020-04-15 LAB — TSH: TSH: 2.327 u[IU]/mL (ref 0.350–4.500)

## 2020-04-15 LAB — ECHOCARDIOGRAM COMPLETE
Area-P 1/2: 3.65 cm2
S' Lateral: 3.5 cm
Single Plane A2C EF: 48.1 %

## 2020-04-15 LAB — COMPREHENSIVE METABOLIC PANEL
ALT: 20 U/L (ref 0–44)
AST: 20 U/L (ref 15–41)
Albumin: 3.8 g/dL (ref 3.5–5.0)
Alkaline Phosphatase: 58 U/L (ref 38–126)
Anion gap: 8 (ref 5–15)
BUN: 11 mg/dL (ref 8–23)
CO2: 26 mmol/L (ref 22–32)
Calcium: 8.8 mg/dL — ABNORMAL LOW (ref 8.9–10.3)
Chloride: 104 mmol/L (ref 98–111)
Creatinine, Ser: 1.12 mg/dL (ref 0.61–1.24)
GFR, Estimated: >60 mL/min (ref >60)
Glucose, Bld: 136 mg/dL — ABNORMAL HIGH (ref 70–99)
Potassium: 4 mmol/L (ref 3.5–5.1)
Sodium: 138 mmol/L (ref 135–145)
Total Bilirubin: 1 mg/dL (ref 0.3–1.2)
Total Protein: 6.3 g/dL — ABNORMAL LOW (ref 6.5–8.1)

## 2020-04-15 LAB — CBC
HCT: 55.8 % — ABNORMAL HIGH (ref 39.0–52.0)
Hemoglobin: 17.7 g/dL — ABNORMAL HIGH (ref 13.0–17.0)
MCH: 29.5 pg (ref 26.0–34.0)
MCHC: 31.7 g/dL (ref 30.0–36.0)
MCV: 93 fL (ref 80.0–100.0)
Platelets: 190 10*3/uL (ref 150–400)
RBC: 6 MIL/uL — ABNORMAL HIGH (ref 4.22–5.81)
RDW: 14.5 % (ref 11.5–15.5)
WBC: 7.2 10*3/uL (ref 4.0–10.5)
nRBC: 0 % (ref 0.0–0.2)

## 2020-04-15 NOTE — Patient Instructions (Addendum)
Labs done today. We will contact you only if your labs are abnormal.  DISCONTINUE taking Amiodarone.  No other medication changes were made. Please continue all current medications as prescribed.  Your physician recommends that you schedule a follow-up appointment in: 6 months. Please contact our office in June to schedule a July appointment.  You have been referred to Dr. Rayann Heman at [facility]. To discuss a possible Atrial Fibrillation Ablation. His office will contact you to schedule an appointment.     If you have any questions or concerns before your next appointment please send [place] a message through [place] or call our office at [phone].    TO LEAVE A MESSAGE FOR THE NURSE SELECT OPTION 2, PLEASE LEAVE A MESSAGE INCLUDING: . YOUR NAME . DATE OF BIRTH . CALL BACK NUMBER . REASON FOR CALL**this is important as we prioritize the call backs  YOU WILL RECEIVE A CALL BACK THE SAME DAY AS LONG AS YOU CALL BEFORE 4:00 PM   Do the following things EVERYDAY: 1) Weigh yourself in the morning before breakfast. Write it down and keep it in a log. 2) Take your medicines as prescribed 3) Eat low salt foods--Limit salt (sodium) to 2000 mg per day.  4) Stay as active as you can everyday 5) Limit all fluids for the day to less than 2 liters   At the [facility], you and your health needs are our priority. As part of our continuing mission to provide you with exceptional heart care, we have created designated Provider Care Teams. These Care Teams include your primary Cardiologist (physician) and Advanced Practice Providers (APPs- Physician Assistants and Nurse Practitioners) who all work together to provide you with the care you need, when you need it.   You may see any of the following providers on your designated Care Team at your next follow up: Marland Kitchen Dr Glori Bickers . Dr Loralie Champagne . Darrick Grinder, NP . Lyda Jester, PA . Audry Riles, PharmD   Please be  sure to bring in all your medications bottles to every appointment. '

## 2020-04-15 NOTE — Progress Notes (Signed)
  Echocardiogram 2D Echocardiogram has been performed.  Walter [name] 04/15/2020, 10:41 AM

## 2020-04-17 NOTE — Progress Notes (Signed)
Date:  04/17/2020   ID:  Walter [name], DOB [birth year], MRN [id number]   Provider location: [place] [facility] Type of Visit: Established patient   PCP:  London Pepper, MD  Cardiologist:  Dr. Aundra Dubin   History of Present Illness: Walter [name] is a 75 y.o. male who has a history of paroxysmal atrial fibrillation, chronic LBBB and cardiomyopathy.  He was found to have LBBB 15-20 years ago.  In 2011, he had a stress test in [facility] that was abnormal so he was taken for [facility] in 6/11 that showed nonobstructive disease.  He had an echo in 5/11 that showed EF 45-50%, diffuse hypokinesis suggesting a mild nonischemic cardiomyopathy.   He has admitted in 7/15 with atrial fibrillation with RVR.  Rate was very difficult to control.  TEE failed due to inability to pass probe x 2.  Eventually, ICE was done to rule out LAA thrombus and he was cardioverted to NSR.  He is on amiodarone now and in NSR.  Echo in 7/15 showed EF 20% with diffuse hypokinesis in setting of atrial fibrillation/RVR.  LHC was also done in 7/15 with minimal coronary disease.  Repeat echo in 10/15 showed EF 20-25%.  In 11/15, he had St Jude CRT-D device placed.  Repeat echo in 4/16 showed EF improved to 50% with mild LVH. Repeat echo in 11/17 showed EF 50-55%, mildly dilated RV with normal systolic function.  Echo in 12/19 again showed EF 50-55% with moderate LVH.   Left THR in 3/21.   Echo (5/21): EF 55%, mild LVH, normal RV, PASP 31 mmHg.  Echo was done today and reviewed, EF 55% with normal RV and normal-sized IVC.   He returns for followup of CHF and paroxysmal atrial fibrillation.  Feels "pretty good" today.  No significant exertional dyspnea.  No chest pain.  Weight is down 5 lbs.  I decreased his amiodarone to 100 mg daily but he still has a bothersome tremor.  No orthopnea/PND. No palpitations.  He is in NSR today.   St Jude device interrogation: >99% BiV paced, thoracic impedance decreased.   ECG  (personally reviewed): NSR, BiV paced  Labs (11/14): K 3.9, creatinine 1.14, LDL 104, LFTs normal Labs (2/15): HCT 56.3 Labs (9/15): K 4.1, creatinine 1.1, digoxin 0.9, TSH normal, LFTs normal Labs (10/15): TGs 262, LDL 82, BNP 68 Labs (11/15): K 4.1, creatinine 1.12 Labs (12/15): K 4.8, creatinine 1.02 Labs (3/16): K 4.6, creatinine 1.06, digoxin 0.8, LFTs normal, TSH normal Labs (11/16): K 4.2, creatinine 1.17 => 1.3, HCT 56.2, TSH normal, LDL 75, HDL 34, LFTs normal Labs (2/17): K 4.4, creatinine 1.18, BNP 20, HCT 50.8, LFTs normal Labs (12/17): K 4.3, creatinine 1.4 Labs (1/18): K 4.8, creatinine 1.26, hgb 18.1, LFTs normal, TSH normal.  Labs (2/19): K 4, creatinine 1.5 Labs (11/19): LDL 58 Labs (5/20): K 4.3, creatinine 1.43 Labs (4/21): K 4.3, creatinine 1.3, TSH normal, LFTs normal  PMH: 1. Type II diabetes 2. HTN 3. Adrenal nodule 4. Restless leg syndrome 5. OA with right hip pain, right frozen shoulder, low back pain. S/p R THR in 12/16.  S/p right shoulder replacement in 2/19. s/p left THR in 3/21.  6. Testicular cancer: 1972 7. Chronic LBBB: Had Cardiolite in 2011 that was abnormal so had LHC in 6/11 in [place].  This showed 30% LCx stenosis and 40% ostial RCA stenosis.  LHC in 7/15 in [place] showed minimal coronary disease.  8. Cardiomyopathy: Nonischemic  cardiomyopathy, ?LBBB cardiomyopathy initially, now possibly tachycardia-mediated.   - Echo (5/11) with EF 45-50%, moderate LVH.  - Echo (7/15) with EF 20%, diffuse hypokinesis, mild to moderately decreased RV systolic function (in setting of afib with RVR).  - LHC (7/15) with minimal coronary disease.   - Echo (10/15) with EF 20-25%, mild LVH, moderate LAE.  - St Jude CRT-D device placed in 11/15.  - Echo (4/16) with EF 50%, mild LVH.  - Echo (11/17): EF 50-55%, mildly dilated RV with normal systolic function.  - Echo (12/19): EF 50-55%, moderate LVH, mildly dilated RV with normal systolic function.  - Echo  (5/21): EF 55%, mild LVH, RV normal, PASP 31 mmHg.  9. Factor V Leiden + 10. Asbestosis: Mild.  Exposure while in the [employer].  11. GERD 12. PVCs 13. Polycythemia: Uncertain etiology 14. Hyperlipidemia: Myalgias with Zocor and Lipitor.  15. Atrial fibrillation: Paroxysmal.  Unable to pass TEE probe.  16. Tremor  Current Outpatient Medications  Medication Sig Dispense Refill  . albuterol (PROVENTIL HFA;VENTOLIN HFA) 108 (90 Base) MCG/ACT inhaler Inhale 1-2 puffs into the lungs every 6 (six) hours as needed for wheezing or shortness of breath.    . ALTOPREV 60 MG 24 hr tablet Take 60 mg by mouth daily.     . carvedilol (COREG) 12.5 MG tablet Take 12.5 mg by mouth 2 (two) times daily with a meal.    . cholecalciferol (VITAMIN D) 25 MCG (1000 UNIT) tablet Take 1,000 Units by mouth daily.    . Cyanocobalamin (VITAMIN B-12) 2500 MCG SUBL Place 2,500 mcg under the tongue daily.    Marland Kitchen ELIQUIS 5 MG TABS tablet TAKE 1 TABLET TWICE A DAY 180 tablet 3  . empagliflozin (JARDIANCE) 10 MG TABS tablet Take 10 mg by mouth daily.    . furosemide (LASIX) 20 MG tablet Take 1 tablet (20 mg total) by mouth every other day. 90 tablet 3  . gabapentin (NEURONTIN) 300 MG capsule Take 300-600 mg by mouth daily as needed (pain).     Marland Kitchen lisinopril (ZESTRIL) 10 MG tablet Take 10 mg by mouth daily.     . melatonin 5 MG TABS Take 10-15 mg by mouth at bedtime as needed (Sleep).     . metFORMIN (GLUCOPHAGE) 500 MG tablet Take 1,000 mg by mouth 2 (two) times daily with a meal.     . morphine (MSIR) 15 MG tablet Take 15 mg by mouth 4 (four) times daily.    Marland Kitchen omega-3 acid ethyl esters (LOVAZA) 1 G capsule Take 1 g by mouth 2 (two) times daily.     . ondansetron (ZOFRAN-ODT) 4 MG disintegrating tablet Take 1 tablet (4 mg total) by mouth every 8 (eight) hours as needed for nausea or vomiting. 10 tablet 0  . pantoprazole (PROTONIX) 40 MG tablet Take 40 mg by mouth daily.    Marland Kitchen rOPINIRole (REQUIP) 1 MG tablet Take 1 mg by mouth  at bedtime.     . sildenafil (VIAGRA) 100 MG tablet Take 100 mg by mouth daily as needed for erectile dysfunction.    Marland Kitchen spironolactone (ALDACTONE) 25 MG tablet TAKE 1 TABLET DAILY IN THE MORNING 90 tablet 3  . testosterone cypionate (DEPOTESTOSTERONE CYPIONATE) 200 MG/ML injection Inject 200 mg into the muscle See admin instructions. into the muscle every 10 days    . tiZANidine (ZANAFLEX) 4 MG tablet Take 4 mg by mouth every 6 (six) hours as needed for muscle spasms.     No current facility-administered  medications for this encounter.    Allergies:   Statins and Cortisone   Social History:  The patient  reports that he quit smoking about 47 years ago. His smoking use included cigarettes. He has never used smokeless tobacco. He reports current alcohol use. He reports that he does not use drugs.   Family History:  The patient's family history includes Heart attack in his maternal uncle; Heart disease in his father, mother, and paternal uncle; Hypertension in his brother, brother, and mother.   ROS:  Please see the history of present illness.   All other systems are personally reviewed and negative.   Exam:   BP 122/80   Pulse 74   Wt 115.8 kg (255 lb 6.4 oz)   SpO2 95%   BMI 34.64 kg/m  General: NAD Neck: No JVD, no thyromegaly or thyroid nodule.  Lungs: Clear to auscultation bilaterally with normal respiratory effort. CV: Nondisplaced PMI.  Heart regular S1/S2, no S3/S4, no murmur.  1+ ankle edema.  No carotid bruit.  Normal pedal pulses.  Abdomen: Soft, nontender, no hepatosplenomegaly, no distention.  Skin: Intact without lesions or rashes.  Neurologic: Alert and oriented x 3.  Psych: Normal affect. Extremities: No clubbing or cyanosis.  HEENT: Normal.   Recent Labs: 10/09/2019: Magnesium 2.0 04/15/2020: ALT 20; BUN 11; Creatinine, Ser 1.12; Hemoglobin 17.7; Platelets 190; Potassium 4.0; Sodium 138; TSH 2.327  Personally reviewed   Wt Readings from Last 3 Encounters:   04/15/20 115.8 kg (255 lb 6.4 oz)  12/04/19 107 kg (235 lb 14.3 oz)  10/08/19 (!) 112.5 kg (248 lb)      ASSESSMENT AND PLAN:  1. Cardiomyopathy: Nonischemic.  There was a pre-existing possible LBBB cardiomyopathy, but EF was down to 20% in the setting of atrial fibrillation with RVR in 7/15 (suggesting tachycardia-mediated cardiomyopathy).  However, EF did not improve in NSR (20-25% on repeat echo in 10/15).  Now with St Jude CRT-D device.  No significant coronary disease on 7/15 LHC. Since placement of CRT-D, EF has improved to 55% (today's echo).  NYHA class II symptoms.  He does not look volume overloaded on exam today and IVC is small on today's echo, but Corvue on device interrogation suggests volume overload with decreased impedance.  In this situation, I think Corvue is wrong and may need to be reset.  Would not change his diuretic.  - Continue current Coreg, spironolactone, and lisinopril.  BMET today.   - Continue Lasix 20 mg daily.  - Continue empagliflozin 2. CAD: Nonobstructive, mild. No chest pain.  Has been unable to tolerate any statin.  No aspirin as he is anticoagulated.  3. Hyperlipidemia: Unable to take statins due to myalgias.    4. HTN: BP is controlled. 5. Atrial fibrillation: Paroxysmal, now in NSR on amiodarone 100 mg daily => dose decreased due to tremor, which remains present and significant.  He has seen Dr. Jannifer Franklin with neurology about this. 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    - Continue Eliquis.  - I will let him try stopping amiodarone given bothersome tremor.  If needed, he could likely use Tikosyn in the future (after amiodarone is cleared).  - I will refer back to Dr. Rayann Heman to look at him for atrial fibrillation ablation.  This would likely allow [place] to safely stop his amiodarone.   Followup in 6 months  Signed, Loralie Champagne, MD  04/17/2020   [address]  [place] [address] [place] [address] [phone] (office) [phone] (fax)

## 2020-04-20 ENCOUNTER — Encounter: Payer: Self-pay | Admitting: Internal Medicine

## 2020-04-20 ENCOUNTER — Ambulatory Visit (INDEPENDENT_AMBULATORY_CARE_PROVIDER_SITE_OTHER): Payer: Medicare Other | Admitting: Internal Medicine

## 2020-04-20 ENCOUNTER — Other Ambulatory Visit: Payer: Self-pay

## 2020-04-20 VITALS — BP 110/70 | HR 69 | Ht 72.0 in | Wt 252.4 lb

## 2020-04-20 DIAGNOSIS — I480 Paroxysmal atrial fibrillation: Secondary | ICD-10-CM | POA: Diagnosis not present

## 2020-04-20 DIAGNOSIS — I5022 Chronic systolic (congestive) heart failure: Secondary | ICD-10-CM

## 2020-04-20 DIAGNOSIS — I10 Essential (primary) hypertension: Secondary | ICD-10-CM | POA: Diagnosis not present

## 2020-04-20 DIAGNOSIS — Z95810 Presence of automatic (implantable) cardiac defibrillator: Secondary | ICD-10-CM | POA: Diagnosis not present

## 2020-04-20 DIAGNOSIS — D6869 Other thrombophilia: Secondary | ICD-10-CM | POA: Diagnosis not present

## 2020-04-20 LAB — CUP PACEART INCLINIC DEVICE CHECK
Battery Remaining Longevity: 20 mo
Brady Statistic RA Percent Paced: 6.3 %
Brady Statistic RV Percent Paced: 99.05 %
Date Time Interrogation Session: 20220202111550
HighPow Impedance: 77.625
Implantable Lead Implant Date: 20151119
Implantable Lead Implant Date: 20151119
Implantable Lead Implant Date: 20151119
Implantable Lead Location: 753858
Implantable Lead Location: 753859
Implantable Lead Location: 753860
Implantable Pulse Generator Implant Date: 20151119
Lead Channel Impedance Value: 387.5 Ohm
Lead Channel Impedance Value: 462.5 Ohm
Lead Channel Impedance Value: 562.5 Ohm
Lead Channel Pacing Threshold Amplitude: 0.5 V
Lead Channel Pacing Threshold Amplitude: 0.75 V
Lead Channel Pacing Threshold Amplitude: 1 V
Lead Channel Pacing Threshold Pulse Width: 0.5 ms
Lead Channel Pacing Threshold Pulse Width: 0.5 ms
Lead Channel Pacing Threshold Pulse Width: 0.5 ms
Lead Channel Sensing Intrinsic Amplitude: 12 mV
Lead Channel Sensing Intrinsic Amplitude: 3.3 mV
Lead Channel Setting Pacing Amplitude: 1.5 V
Lead Channel Setting Pacing Amplitude: 2 V
Lead Channel Setting Pacing Amplitude: 2.5 V
Lead Channel Setting Pacing Pulse Width: 0.5 ms
Lead Channel Setting Pacing Pulse Width: 0.5 ms
Lead Channel Setting Sensing Sensitivity: 0.5 mV
Pulse Gen Serial Number: 7211486

## 2020-04-20 NOTE — Patient Instructions (Addendum)
Medication Instructions:  Your physician recommends that you continue on your current medications as directed. Please refer to the Current Medication list given to you today.  Labwork: None ordered.  Testing/Procedures: None ordered.  Follow-Up: Your physician wants you to follow-up in: 10/24/20 at 2 pm with with Dr. Rayann Heman.  Any Other Special Instructions Will Be Listed Below (If Applicable).  If you need a refill on your cardiac medications before your next appointment, please call your pharmacy.

## 2020-04-20 NOTE — Progress Notes (Signed)
PCP: London Pepper, MD Primary Cardiologist: Dr Aundra Dubin Primary EP: [name]  Walter [name] is a 75 y.o. male who presents today for routine electrophysiology followup.  Since last being seen in our clinic, the patient reports doing very well.  Today, he denies symptoms of palpitations, chest pain, shortness of breath,  lower extremity edema, dizziness, presyncope, syncope, or ICD shocks.  The patient is otherwise without complaint today.   Past Medical History:  Diagnosis Date  . AICD (automatic cardioverter/defibrillator) present   . Anxiety   . Asbestosis ([place])    mild  . Atrial fibrillation ([place])   . Chronic low back pain    /sciatica- Dr Letta Median [facility])  . Complication of anesthesia 1996   previous surgery had a run of v-tach (for gallbladder-1990's)-cannot lie completely flat and panis when oxygen mask placed over face  . Coronary artery disease, non-occlusive June 2011   Cardiac [facility] in [facility] following abnormal Myoview  . Delirium 05/06/2019  . DJD (degenerative joint disease)    /osteoarthritis  . Dyslipidemia, goal LDL below 100    On lovastatin (myalgias with Zocor and Lipitor)  . Dyspnea    worse in hot weather  . Dysrhythmia    LBBB, in 2015-atrial fibrillation  . Essential hypertension   . Factor V Leiden [facility])    sees Dr. Cherrie Gauze Marrow- no problems with this  . Frozen shoulder    right  . GERD (gastroesophageal reflux disease)   . H/O asbestosis    when in [employer]-1964-1970,1971-1986 in [employer]-diagnosed in 1990  . History of kidney stones   . Hypogonadism male   . Irritable bowel   . Kidney stone   . Left bundle branch block (LBBB) on electrocardiogram    Diagnosed close to 20 years ago  . Myocardial infarction ([place])   . Non-ischemic cardiomyopathy [facility]) June 2011   EF 45-50%; suspect related to LBBB; repeat Echo March 2015: EF 50- 55%  . Obesity   . PONV (postoperative nausea and vomiting)   . Restless leg syndrome    . Rotator cuff arthropathy of right shoulder 05/14/2017  . Sleep apnea   . Testicular cancer ([place]) 1972   left  . Tremor, essential 12/09/2015  . Type 2 diabetes mellitus without complication ([place]) 08/2945   Type 2.   . Wears glasses    Past Surgical History:  Procedure Laterality Date  . APPENDECTOMY N/A   . BI-VENTRICULAR IMPLANTABLE CARDIOVERTER DEFIBRILLATOR N/A 02/04/2014   SJM Quadra Assura BiV ICD implanted for primary prevention by [name]  . CARDIAC CATHETERIZATION  June 2011   Nonobstructive CAD  . CARDIAC DEFIBRILLATOR PLACEMENT  02/04/14   [facility] model [place] 3365-40Q (serial Number [id number]) biventricular ICD.  Marland Kitchen CARDIOVERSION N/A 10/13/2013   Procedure: CARDIOVERSION;  Surgeon: Larey Dresser, MD;  Location: [place];  Service: Cardiovascular;  Laterality: N/A;  . CARDIOVERSION  10-16-2013   DCCV by [name] following intracardiac echo to rule out LAA thrombus  . CARDIOVERSION N/A 10/16/2013   Procedure: CARDIOVERSION;  Surgeon: Coralyn Mark, MD;  Location: [facility] CATH LAB;  Service: Cardiovascular;  Laterality: N/A;  . CHOLECYSTECTOMY OPEN    . COLONOSCOPY WITH PROPOFOL N/A 12/04/2019   Procedure: COLONOSCOPY WITH PROPOFOL;  Surgeon: Wonda Horner, MD;  Location: [facility] ENDOSCOPY;  Service: Endoscopy;  Laterality: N/A;  . ELECTROPHYSIOLOGY STUDY N/A 10/16/2013   Procedure: ELECTROPHYSIOLOGY STUDY;  Surgeon: Coralyn Mark, MD;  Location: [facility]  CATH LAB;  Service: Cardiovascular;  Laterality: N/A;  . ESOPHAGOGASTRODUODENOSCOPY N/A 10/15/2013   Procedure: ESOPHAGOGASTRODUODENOSCOPY (EGD);  Surgeon: Gatha Mayer, MD;  Location: [facility] ENDOSCOPY;  Service: Endoscopy;  Laterality: N/A;  bedside  . HERNIA REPAIR    . KNEE ARTHROSCOPY Left   . LEFT AND RIGHT HEART CATHETERIZATION WITH CORONARY ANGIOGRAM N/A 10/12/2013   Procedure: LEFT AND RIGHT HEART CATHETERIZATION WITH CORONARY ANGIOGRAM;  Surgeon: Troy Sine, MD;  Location: [facility] CATH LAB;  Service: Cardiovascular;   Laterality: N/A;  . lymph nodes    . RADIOFREQUENCY ABLATION NERVES     Back every 6 months  . REVERSE SHOULDER ARTHROPLASTY Right 05/14/2017   Procedure: TOTAL REVERSE SHOULDER ARTHROPLASTY;  Surgeon: Marchia Bond, MD;  Location: [place];  Service: Orthopedics;  Laterality: Right;  . ROTATOR CUFF REPAIR Right   . SURGERY SCROTAL / TESTICULAR Left   . TONSILLECTOMY Bilateral   . TOTAL HIP ARTHROPLASTY Right 02/22/2015   Procedure: RIGHT TOTAL HIP ARTHROPLASTY ANTERIOR APPROACH;  Surgeon: Renette Butters, MD;  Location: [place];  Service: Orthopedics;  Laterality: Right;  . TOTAL HIP ARTHROPLASTY Left 05/26/2019   Procedure: TOTAL HIP ARTHROPLASTY;  Surgeon: Marchia Bond, MD;  Location: [facility] ORS;  Service: Orthopedics;  Laterality: Left;  . TRANSTHORACIC ECHOCARDIOGRAM  May 2011   EF 45% with diffuse hypokinesis; septal bounce from LBBB  . TRANSTHORACIC ECHOCARDIOGRAM  March 2015   EF 50-55%. Mild concentric LVH. Septal dyssynergy from LBBB     ROS- all systems are reviewed and negative except as per HPI above  Current Outpatient Medications  Medication Sig Dispense Refill  . albuterol (PROVENTIL HFA;VENTOLIN HFA) 108 (90 Base) MCG/ACT inhaler Inhale 1-2 puffs into the lungs every 6 (six) hours as needed for wheezing or shortness of breath.    . ALTOPREV 60 MG 24 hr tablet Take 60 mg by mouth daily.     . carvedilol (COREG) 12.5 MG tablet Take 12.5 mg by mouth 2 (two) times daily with a meal.    . cholecalciferol (VITAMIN D) 25 MCG (1000 UNIT) tablet Take 1,000 Units by mouth daily.    . Cyanocobalamin (VITAMIN B-12) 2500 MCG SUBL Place 2,500 mcg under the tongue daily.    Marland Kitchen ELIQUIS 5 MG TABS tablet TAKE 1 TABLET TWICE A DAY 180 tablet 3  . empagliflozin (JARDIANCE) 10 MG TABS tablet Take 10 mg by mouth daily.    . furosemide (LASIX) 20 MG tablet Take 1 tablet (20 mg total) by mouth every other day. 90 tablet 3  . gabapentin (NEURONTIN) 300 MG capsule Take 300-600 mg by mouth daily as needed  (pain).     Marland Kitchen lisinopril (ZESTRIL) 10 MG tablet Take 10 mg by mouth daily.     . melatonin 5 MG TABS Take 10-15 mg by mouth at bedtime as needed (Sleep).     . metFORMIN (GLUCOPHAGE) 500 MG tablet Take 1,000 mg by mouth 2 (two) times daily with a meal.     . morphine (MSIR) 15 MG tablet Take 15 mg by mouth 4 (four) times daily.    Marland Kitchen omega-3 acid ethyl esters (LOVAZA) 1 G capsule Take 1 g by mouth 2 (two) times daily.     . ondansetron (ZOFRAN-ODT) 4 MG disintegrating tablet Take 1 tablet (4 mg total) by mouth every 8 (eight) hours as needed for nausea or vomiting. 10 tablet 0  . pantoprazole (PROTONIX) 40 MG tablet Take 40 mg by mouth daily.    Marland Kitchen rOPINIRole (REQUIP)  1 MG tablet Take 1 mg by mouth at bedtime.     . sildenafil (VIAGRA) 100 MG tablet Take 100 mg by mouth daily as needed for erectile dysfunction.    Marland Kitchen spironolactone (ALDACTONE) 25 MG tablet TAKE 1 TABLET DAILY IN THE MORNING 90 tablet 3  . testosterone cypionate (DEPOTESTOSTERONE CYPIONATE) 200 MG/ML injection Inject 200 mg into the muscle See admin instructions. into the muscle every 10 days    . tiZANidine (ZANAFLEX) 4 MG tablet Take 4 mg by mouth every 6 (six) hours as needed for muscle spasms.     No current facility-administered medications for this visit.    Physical Exam: Vitals:   04/20/20 1101  BP: 110/70  Pulse: 69  SpO2: 94%  Weight: 252 lb 6.4 oz (114.5 kg)  Height: 6' (1.829 m)    GEN- The patient is well appearing, alert and oriented x 3 today.   Head- normocephalic, atraumatic Eyes-  Sclera clear, conjunctiva pink Ears- hearing intact Oropharynx- clear Lungs- Clear to ausculation bilaterally, normal work of breathing Chest- ICD pocket is well healed Heart- Regular rate and rhythm, no murmurs, rubs or gallops, PMI not laterally displaced GI- soft, NT, ND, + BS Extremities- no clubbing, cyanosis, or edema  ICD interrogation- reviewed in detail today,  See PACEART report  ekg tracing ordered today is  personally reviewed and shows sinus with BiV pacing  Wt Readings from Last 3 Encounters:  04/20/20 252 lb 6.4 oz (114.5 kg)  04/15/20 255 lb 6.4 oz (115.8 kg)  12/04/19 235 lb 14.3 oz (107 kg)    Assessment and Plan:  1.  Chronic systolic dysfunction/ nonischemic CM euvolemic today EF has recovered with CRT Stable on an appropriate medical regimen Normal ICD function See Pace Art report No changes today he is not device dependant today followed in ICM device clinic  2. Paroxysmal atrial fibrillation Well controlled (<1 % by ICD interrogation) Amiodarone recently stopped due to tremor  Chads2vasc score is 4.  he is anticoagulated with eliquis . Therapeutic strategies for afib including medicine (tikosyn) and ablation were discussed in detail with the patient today. Risk, benefits, and alternatives to each approach were discussed.  At this time, he would prefer conservative watchful waiting.  I think this is very reasonable. We will follow his ICD diagnostics closely.  If he develops afib, we will consider tikosyn or ablation at that time.  3. HTN Stable No change required today   Risks, benefits and potential toxicities for medications prescribed and/or refilled reviewed with patient today.   Return to see me in 6 months  Thompson Grayer MD, [facility] 04/20/2020 11:15 AM

## 2020-04-26 ENCOUNTER — Ambulatory Visit (INDEPENDENT_AMBULATORY_CARE_PROVIDER_SITE_OTHER): Payer: Medicare Other

## 2020-04-26 DIAGNOSIS — I5022 Chronic systolic (congestive) heart failure: Secondary | ICD-10-CM | POA: Diagnosis not present

## 2020-04-26 DIAGNOSIS — Z95810 Presence of automatic (implantable) cardiac defibrillator: Secondary | ICD-10-CM | POA: Diagnosis not present

## 2020-04-27 DIAGNOSIS — M25559 Pain in unspecified hip: Secondary | ICD-10-CM | POA: Diagnosis not present

## 2020-04-27 DIAGNOSIS — Z79891 Long term (current) use of opiate analgesic: Secondary | ICD-10-CM | POA: Diagnosis not present

## 2020-04-27 DIAGNOSIS — Z79899 Other long term (current) drug therapy: Secondary | ICD-10-CM | POA: Diagnosis not present

## 2020-04-27 DIAGNOSIS — G894 Chronic pain syndrome: Secondary | ICD-10-CM | POA: Diagnosis not present

## 2020-04-27 DIAGNOSIS — M47817 Spondylosis without myelopathy or radiculopathy, lumbosacral region: Secondary | ICD-10-CM | POA: Diagnosis not present

## 2020-04-27 DIAGNOSIS — M5459 Other low back pain: Secondary | ICD-10-CM | POA: Diagnosis not present

## 2020-04-29 DIAGNOSIS — E291 Testicular hypofunction: Secondary | ICD-10-CM | POA: Diagnosis not present

## 2020-04-29 DIAGNOSIS — I2510 Atherosclerotic heart disease of native coronary artery without angina pectoris: Secondary | ICD-10-CM | POA: Diagnosis not present

## 2020-04-29 DIAGNOSIS — I10 Essential (primary) hypertension: Secondary | ICD-10-CM | POA: Diagnosis not present

## 2020-04-29 DIAGNOSIS — I4891 Unspecified atrial fibrillation: Secondary | ICD-10-CM | POA: Diagnosis not present

## 2020-04-29 DIAGNOSIS — Z125 Encounter for screening for malignant neoplasm of prostate: Secondary | ICD-10-CM | POA: Diagnosis not present

## 2020-04-29 DIAGNOSIS — E538 Deficiency of other specified B group vitamins: Secondary | ICD-10-CM | POA: Diagnosis not present

## 2020-04-29 DIAGNOSIS — E1169 Type 2 diabetes mellitus with other specified complication: Secondary | ICD-10-CM | POA: Diagnosis not present

## 2020-04-29 DIAGNOSIS — E785 Hyperlipidemia, unspecified: Secondary | ICD-10-CM | POA: Diagnosis not present

## 2020-05-02 NOTE — Progress Notes (Signed)
EPIC Encounter for ICM Monitoring  Patient Name: Walter [name] is a 75 y.o. male Date: 05/02/2020 Primary Care Physican: London Pepper, MD Primary Cardiologist:[place] Electrophysiologist: Allred Bi-V Pacing:98% 1/7/2022Weight:243-244lbs   Transmission reviewed.   CorVuethoracic impedancenormal.  Furosemide20 mgTake 1 tablet (20 mg total) bymouthevery other day.  Labs: 08/25/2019 Creatinine 1.13, BUN 16, Potassium 4.1, Sodium 137, GFR >60 08/10/2019 Creatinine 1.48, BUN 38, Potassium 5.5, Sodium 135, GFR 46-53 07/03/2019 Creatinine 1.09, BUN 23, Potassium 4.6, Sodium 136, GFR 66-76 A complete set of results can be found in Results Review.  Recommendations:No changes.  Follow-up plan: ICM clinic phone appointment on 05/30/2020.91 day device clinic remote transmission3/04/2020.  EP/Cardiology Office Visits:8/8/2022with Dr. Rayann Heman.   Copy of ICM check sent to Dr.Allred.  3 month ICM trend: 04/26/2020.    1 Year ICM trend:       Rosalene Billings, RN 05/02/2020 4:25 PM

## 2020-05-16 ENCOUNTER — Emergency Department (HOSPITAL_BASED_OUTPATIENT_CLINIC_OR_DEPARTMENT_OTHER): Payer: Medicare Other

## 2020-05-16 ENCOUNTER — Other Ambulatory Visit: Payer: Self-pay

## 2020-05-16 ENCOUNTER — Encounter (HOSPITAL_BASED_OUTPATIENT_CLINIC_OR_DEPARTMENT_OTHER): Payer: Self-pay | Admitting: Emergency Medicine

## 2020-05-16 ENCOUNTER — Emergency Department (HOSPITAL_BASED_OUTPATIENT_CLINIC_OR_DEPARTMENT_OTHER)
Admission: EM | Admit: 2020-05-16 | Discharge: 2020-05-16 | Disposition: A | Discharge status: Home / Self Care | Payer: Medicare Other | Attending: Emergency Medicine | Admitting: Emergency Medicine

## 2020-05-16 DIAGNOSIS — Z471 Aftercare following joint replacement surgery: Secondary | ICD-10-CM | POA: Diagnosis not present

## 2020-05-16 DIAGNOSIS — I2510 Atherosclerotic heart disease of native coronary artery without angina pectoris: Secondary | ICD-10-CM | POA: Insufficient documentation

## 2020-05-16 DIAGNOSIS — Z7901 Long term (current) use of anticoagulants: Secondary | ICD-10-CM | POA: Diagnosis not present

## 2020-05-16 DIAGNOSIS — M25551 Pain in right hip: Secondary | ICD-10-CM | POA: Diagnosis not present

## 2020-05-16 DIAGNOSIS — Z7984 Long term (current) use of oral hypoglycemic drugs: Secondary | ICD-10-CM | POA: Insufficient documentation

## 2020-05-16 DIAGNOSIS — R52 Pain, unspecified: Secondary | ICD-10-CM

## 2020-05-16 DIAGNOSIS — I10 Essential (primary) hypertension: Secondary | ICD-10-CM | POA: Diagnosis not present

## 2020-05-16 DIAGNOSIS — E119 Type 2 diabetes mellitus without complications: Secondary | ICD-10-CM | POA: Diagnosis not present

## 2020-05-16 DIAGNOSIS — Z96641 Presence of right artificial hip joint: Secondary | ICD-10-CM | POA: Diagnosis not present

## 2020-05-16 DIAGNOSIS — Z87891 Personal history of nicotine dependence: Secondary | ICD-10-CM | POA: Insufficient documentation

## 2020-05-16 DIAGNOSIS — Z79899 Other long term (current) drug therapy: Secondary | ICD-10-CM | POA: Diagnosis not present

## 2020-05-16 DIAGNOSIS — Z96643 Presence of artificial hip joint, bilateral: Secondary | ICD-10-CM | POA: Insufficient documentation

## 2020-05-16 MED ORDER — DEXAMETHASONE SODIUM PHOSPHATE 10 MG/ML IJ SOLN
10.0000 mg | Freq: Once | INTRAMUSCULAR | Status: AC
  Administered 2020-05-16: 10 mg via INTRAMUSCULAR
  Filled 2020-05-16: qty 1

## 2020-05-16 MED ORDER — HYDROCODONE-ACETAMINOPHEN 5-325 MG PO TABS
1.0000 | ORAL_TABLET | Freq: Once | ORAL | Status: DC
  Filled 2020-05-16: qty 1

## 2020-05-16 MED ORDER — OXYCODONE-ACETAMINOPHEN 5-325 MG PO TABS
2.0000 | ORAL_TABLET | Freq: Once | ORAL | Status: AC
  Administered 2020-05-16: 2 via ORAL
  Filled 2020-05-16: qty 2

## 2020-05-16 NOTE — ED Provider Notes (Signed)
[facility] EMERGENCY DEPARTMENT Provider Note   CSN: [id number] Arrival date & time: 05/16/20  7510     History Chief Complaint  Patient presents with  . Leg Pain    Walter [name] is a 75 y.o. male.  HPI     This is a 75 year old male with a history of atrial fibrillation, chronic low back pain and sciatica, hypertension, diabetes who presents with right leg pain.  Patient reports prior history of right hip arthroplasty.  He states over the last several days he has noted pain that originates in his right groin and radiates down his right leg "like a charley horse."  He states the pain is gotten progressively worse.  He is on pain management and has been taking his pain medication at home without relief.  He states at times he is in so much pain that he is unable to ambulate.  He reports tingling down his entire leg.  He has back pain that is unchanged from his chronic pain.  No difficulty pain with pooping.  No weakness of the extremity.  He rates his pain at 8 out of 10.  Denies trauma or fall.  Past Medical History:  Diagnosis Date  . AICD (automatic cardioverter/defibrillator) present   . Anxiety   . Asbestosis ([place])    mild  . Atrial fibrillation ([place])   . Chronic low back pain    /sciatica- Dr Letta Median [facility])  . Complication of anesthesia 1996   previous surgery had a run of v-tach (for gallbladder-1990's)-cannot lie completely flat and panis when oxygen mask placed over face  . Coronary artery disease, non-occlusive June 2011   Cardiac [facility] in [facility] following abnormal Myoview  . Delirium 05/06/2019  . DJD (degenerative joint disease)    /osteoarthritis  . Dyslipidemia, goal LDL below 100    On lovastatin (myalgias with Zocor and Lipitor)  . Dyspnea    worse in hot weather  . Dysrhythmia    LBBB, in 2015-atrial fibrillation  . Essential hypertension   . 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 Cherrie Gauze Marrow- no problems with this  .  Frozen shoulder    right  . GERD (gastroesophageal reflux disease)   . H/O asbestosis    when in [employer]-1964-1970,1971-1986 in [employer]-diagnosed in 1990  . History of kidney stones   . Hypogonadism male   . Irritable bowel   . Kidney stone   . Left bundle branch block (LBBB) on electrocardiogram    Diagnosed close to 20 years ago  . Myocardial infarction ([place])   . Non-ischemic cardiomyopathy [facility]) June 2011   EF 45-50%; suspect related to LBBB; repeat Echo March 2015: EF 50- 55%  . Obesity   . PONV (postoperative nausea and vomiting)   . Restless leg syndrome   . Rotator cuff arthropathy of right shoulder 05/14/2017  . Sleep apnea   . Testicular cancer ([place]) 1972   left  . Tremor, essential 12/09/2015  . Type 2 diabetes mellitus without complication ([place]) 04/5850   Type 2.   . Wears glasses     Patient Active Problem List   Diagnosis Date Noted  . Acute diverticulitis 10/08/2019  . Diverticulitis 10/08/2019  . Osteoarthritis of left hip 05/26/2019  . S/P total hip arthroplasty 05/26/2019  . Delirium 05/06/2019  . Rotator cuff arthropathy of right shoulder 05/14/2017  . Primary localized osteoarthrosis of shoulder 05/14/2017  . Tremor, essential 12/09/2015  . DJD (degenerative joint  disease) 02/22/2015  . Primary osteoarthritis of right hip 02/22/2015  . Status post total replacement of right hip 02/22/2015  . Paroxysmal atrial fibrillation ([place]) 02/17/2014  . Chronic systolic dysfunction of left ventricle 01/16/2014  . Left bundle branch block 01/16/2014  . Long term (current) use of anticoagulants 10/20/2013  . Esophageal spasm 10/15/2013  . Cardiomyopathy, nonischemic ([place]) 10/12/2013  . Abnormal cardiovascular function study 10/11/2013  . Persistent atrial fibrillation ([place]) 10/09/2013  . Nonischemic cardiomyopathy ([place]) 05/18/2013  . Essential hypertension, benign 05/12/2013  . Restless leg syndrome 05/12/2013  . Insomnia 05/12/2013  . Palpitations 05/12/2013   . Encounter for long-term (current) use of other medications 05/12/2013  . Non-occlusive coronary artery disease 05/12/2013    Past Surgical History:  Procedure Laterality Date  . APPENDECTOMY N/A   . BI-VENTRICULAR IMPLANTABLE CARDIOVERTER DEFIBRILLATOR N/A 02/04/2014   SJM Quadra Assura BiV ICD implanted for primary prevention by [name]  . CARDIAC CATHETERIZATION  June 2011   Nonobstructive CAD  . CARDIAC DEFIBRILLATOR PLACEMENT  02/04/14   [facility] model [place] 3365-40Q (serial Number [id number]) biventricular ICD.  Marland Kitchen CARDIOVERSION N/A 10/13/2013   Procedure: CARDIOVERSION;  Surgeon: Larey Dresser, MD;  Location: [facility];  Service: Cardiovascular;  Laterality: N/A;  . CARDIOVERSION  10-16-2013   DCCV by [name] following intracardiac echo to rule out LAA thrombus  . CARDIOVERSION N/A 10/16/2013   Procedure: CARDIOVERSION;  Surgeon: Coralyn Mark, MD;  Location: [facility] CATH LAB;  Service: Cardiovascular;  Laterality: N/A;  . CHOLECYSTECTOMY OPEN    . COLONOSCOPY WITH PROPOFOL N/A 12/04/2019   Procedure: COLONOSCOPY WITH PROPOFOL;  Surgeon: Wonda Horner, MD;  Location: [facility] ENDOSCOPY;  Service: Endoscopy;  Laterality: N/A;  . ELECTROPHYSIOLOGY STUDY N/A 10/16/2013   Procedure: ELECTROPHYSIOLOGY STUDY;  Surgeon: Coralyn Mark, MD;  Location: [facility] CATH LAB;  Service: Cardiovascular;  Laterality: N/A;  . ESOPHAGOGASTRODUODENOSCOPY N/A 10/15/2013   Procedure: ESOPHAGOGASTRODUODENOSCOPY (EGD);  Surgeon: Gatha Mayer, MD;  Location: [facility] ENDOSCOPY;  Service: Endoscopy;  Laterality: N/A;  bedside  . HERNIA REPAIR    . KNEE ARTHROSCOPY Left   . LEFT AND RIGHT HEART CATHETERIZATION WITH CORONARY ANGIOGRAM N/A 10/12/2013   Procedure: LEFT AND RIGHT HEART CATHETERIZATION WITH CORONARY ANGIOGRAM;  Surgeon: Troy Sine, MD;  Location: [facility] CATH LAB;  Service: Cardiovascular;  Laterality: N/A;  . lymph nodes    . RADIOFREQUENCY ABLATION NERVES     Back every 6 months  . REVERSE  SHOULDER ARTHROPLASTY Right 05/14/2017   Procedure: TOTAL REVERSE SHOULDER ARTHROPLASTY;  Surgeon: Marchia Bond, MD;  Location: [facility];  Service: Orthopedics;  Laterality: Right;  . ROTATOR CUFF REPAIR Right   . SURGERY SCROTAL / TESTICULAR Left   . TONSILLECTOMY Bilateral   . TOTAL HIP ARTHROPLASTY Right 02/22/2015   Procedure: RIGHT TOTAL HIP ARTHROPLASTY ANTERIOR APPROACH;  Surgeon: Renette Butters, MD;  Location: [place];  Service: Orthopedics;  Laterality: Right;  . TOTAL HIP ARTHROPLASTY Left 05/26/2019   Procedure: TOTAL HIP ARTHROPLASTY;  Surgeon: Marchia Bond, MD;  Location: [facility] ORS;  Service: Orthopedics;  Laterality: Left;  . TRANSTHORACIC ECHOCARDIOGRAM  May 2011   EF 45% with diffuse hypokinesis; septal bounce from LBBB  . TRANSTHORACIC ECHOCARDIOGRAM  March 2015   EF 50-55%. Mild concentric LVH. Septal dyssynergy from LBBB        Family History  Problem Relation Age of Onset  . Hypertension Mother   . Heart disease Mother   . Heart disease  Father   . Hypertension Brother   . Heart attack Maternal Uncle   . Hypertension Brother   . Heart disease Paternal Uncle     Social History   Tobacco Use  . Smoking status: Former Smoker    Types: Cigarettes    Quit date: 02/10/1973    Years since quitting: 47.2  . Smokeless tobacco: Never Used  Vaping Use  . Vaping Use: Never used  Substance Use Topics  . Alcohol use: Yes    Comment: rare  . Drug use: No    Home Medications Prior to Admission medications   Medication Sig Start Date End Date Taking? Authorizing Provider  albuterol (PROVENTIL HFA;VENTOLIN HFA) 108 (90 Base) MCG/ACT inhaler Inhale 1-2 puffs into the lungs every 6 (six) hours as needed for wheezing or shortness of breath.    [provider]  ALTOPREV 60 MG 24 hr tablet Take 60 mg by mouth daily.  09/25/19   [provider]  carvedilol (COREG) 12.5 MG tablet Take 12.5 mg by mouth 2 (two) times daily with a meal.    [provider]   cholecalciferol (VITAMIN D) 25 MCG (1000 UNIT) tablet Take 1,000 Units by mouth daily.    [provider]  Cyanocobalamin (VITAMIN B-12) 2500 MCG SUBL Place 2,500 mcg under the tongue daily.    [provider]  ELIQUIS 5 MG TABS tablet TAKE 1 TABLET TWICE A DAY 04/15/20   Larey Dresser, MD  empagliflozin (JARDIANCE) 10 MG TABS tablet Take 10 mg by mouth daily.    [provider]  furosemide (LASIX) 20 MG tablet Take 1 tablet (20 mg total) by mouth every other day. 08/10/19   Larey Dresser, MD  gabapentin (NEURONTIN) 300 MG capsule Take 300-600 mg by mouth daily as needed (pain).     [provider]  lisinopril (ZESTRIL) 10 MG tablet Take 10 mg by mouth daily.     [provider]  melatonin 5 MG TABS Take 10-15 mg by mouth at bedtime as needed (Sleep).     [provider]  metFORMIN (GLUCOPHAGE) 500 MG tablet Take 1,000 mg by mouth 2 (two) times daily with a meal.     [provider]  morphine (MSIR) 15 MG tablet Take 15 mg by mouth 4 (four) times daily. 10/05/19   [provider]  omega-3 acid ethyl esters (LOVAZA) 1 G capsule Take 1 g by mouth 2 (two) times daily.     [provider]  ondansetron (ZOFRAN-ODT) 4 MG disintegrating tablet Take 1 tablet (4 mg total) by mouth every 8 (eight) hours as needed for nausea or vomiting. 10/10/19   Patrecia Pour, MD  pantoprazole (PROTONIX) 40 MG tablet Take 40 mg by mouth daily.    [provider]  rOPINIRole (REQUIP) 1 MG tablet Take 1 mg by mouth at bedtime.  11/28/15   [provider]  sildenafil (VIAGRA) 100 MG tablet Take 100 mg by mouth daily as needed for erectile dysfunction.    [provider]  spironolactone (ALDACTONE) 25 MG tablet TAKE 1 TABLET DAILY IN THE MORNING 04/15/20   Larey Dresser, MD  testosterone cypionate (DEPOTESTOSTERONE CYPIONATE) 200 MG/ML injection Inject 200 mg into the muscle See admin instructions. into the muscle  every 10 days    [provider]  tiZANidine (ZANAFLEX) 4 MG tablet Take 4 mg by mouth every 6 (six) hours as needed for muscle spasms.    [provider]  Allergies    Statins and Cortisone  Review of Systems   Review of Systems  Constitutional: Negative for fever.  Respiratory: Negative for shortness of breath.   Cardiovascular: Negative for chest pain.  Gastrointestinal: Negative for abdominal pain, nausea and vomiting.  Musculoskeletal:       Right leg pain  All other systems reviewed and are negative.   Physical Exam Updated Vital Signs BP (!) 152/90 (BP Location: Right Arm)   Pulse 97   Temp 98.4 F (36.9 C) (Oral)   Resp 18   Ht 1.829 m (6')   Wt 113.7 kg   SpO2 93%   BMI 33.99 kg/m   Physical Exam Vitals and nursing note reviewed.  Constitutional:      Appearance: He is well-developed and well-nourished. He is obese. He is not ill-appearing.  HENT:     Head: Normocephalic and atraumatic.     Nose: Nose normal.     Mouth/Throat:     Mouth: Mucous membranes are moist.  Eyes:     Pupils: Pupils are equal, round, and reactive to light.  Cardiovascular:     Rate and Rhythm: Normal rate and regular rhythm.     Heart sounds: No murmur heard.   Pulmonary:     Effort: Pulmonary effort is normal. No respiratory distress.  Abdominal:     Palpations: Abdomen is soft.     Tenderness: There is no abdominal tenderness.  Musculoskeletal:        General: No edema.     Cervical back: Neck supple.     Comments: Pain with range of motion of the right hip, tenderness palpation in his right groin over the pubic ramus and lateral hip, no overlying skin changes, neurovascular intact distally, no obvious deformity  Lymphadenopathy:     Cervical: No cervical adenopathy.  Skin:    General: Skin is warm and dry.  Neurological:     Mental Status: He is alert and oriented to person, place, and time.  Psychiatric:        Mood and Affect: Mood and affect and  mood normal.     ED Results / Procedures / Treatments   Labs (all labs ordered are listed, but only abnormal results are displayed) Labs Reviewed - No data to display  EKG None  Radiology DG HIP UNILAT WITH PELVIS 2-3 VIEWS RIGHT  Result Date: 05/16/2020 CLINICAL DATA:  75 year old male with pain radiating down the right leg to the foot. EXAM: DG HIP (WITH OR WITHOUT PELVIS) 2-3V RIGHT COMPARISON:  CT Abdomen and Pelvis 10/08/2019. FINDINGS: Chronic bilateral hip arthroplasty. Total hip arthroplasty on the right where hardware appears intact and normally aligned. Bulky chronic heterotopic ossification superior and lateral to the right hip. Grossly intact visible left hip arthroplasty. Pelvis appears intact. SI joints appear normal. Multilevel lumbar spine degeneration. Proximal right femur intact. No acute osseous abnormality identified. Negative visible bowel gas pattern. IMPRESSION: 1. No acute osseous abnormality identified about the right hip or pelvis. 2. 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 Electronically Signed   By: Genevie Ann M.D.   On: 05/16/2020 05:43    Procedures Procedures   Medications Ordered in ED Medications  dexamethasone (DECADRON) injection 10 mg (has no administration in time range)  oxyCODONE-acetaminophen (PERCOCET/ROXICET) 5-325 MG per tablet 2 tablet (2 tablets Oral Given 05/16/20 9983)    ED Course  I have reviewed the triage vital signs and the nursing notes.  Pertinent labs &  imaging results that were available during my care of the patient were reviewed by me and considered in my medical decision making (see chart for details).    MDM Rules/Calculators/A&P                          Patient presents with right hip pain.  Denies trauma or fall.  He is overall nontoxic vital signs are reassuring.  He is neurovascularly intact.  He has pain with range of motion and tenderness to palpation.  Highly  suggestive of musculoskeletal etiology such as fracture.  He has chronic low back pain no other red flags to suggest cauda equina or sciatica.  He states specifically that this is different than his chronic known sciatica.  Doubt DVT given compliance with Eliquis and lack of swelling or calf tenderness.  Patient was given pain medication.  X-rays obtained.   X-rays independently reviewed by myself.  No obvious fracture.  He has bulky chronic hypertrophic ossification on the right which appears to me to be worse than his prior x-rays.  This could be contributing to his symptoms.  He is not a candidate to add NSAIDs to his pain regimen because he is on Eliquis.  He was given a dose of Decadron for anti-inflammatory effect.  Recommend follow-up with orthopedist and pain specialist.  I have low suspicion at this time for occult fracture given lack of obvious fracture on x-ray and no trauma history  After history, exam, and medical workup I feel the patient has been appropriately medically screened and is safe for discharge home. Pertinent diagnoses were discussed with the patient. Patient was given return precautions.     Final Clinical Impression(s) / ED Diagnoses Final diagnoses:  Right hip pain    Rx / DC Orders ED Discharge Orders    None       Merryl Hacker, MD 05/16/20 [id number]

## 2020-05-16 NOTE — ED Triage Notes (Signed)
Reports pain in right leg from hip down to foot for the last 10 days.  Reports the pain comes and goes.  Is taking pain meds and muscle relaxers with some relief.

## 2020-05-16 NOTE — Discharge Instructions (Addendum)
You were seen today for worsening hip pain.  Your x-ray shows some ossification of the right hip.  Follow-up closely with your orthopedist and pain specialist regarding ongoing pain management.

## 2020-05-18 ENCOUNTER — Ambulatory Visit (INDEPENDENT_AMBULATORY_CARE_PROVIDER_SITE_OTHER): Payer: Medicare Other

## 2020-05-18 DIAGNOSIS — M25511 Pain in right shoulder: Secondary | ICD-10-CM | POA: Diagnosis not present

## 2020-05-18 DIAGNOSIS — I5022 Chronic systolic (congestive) heart failure: Secondary | ICD-10-CM

## 2020-05-18 DIAGNOSIS — M542 Cervicalgia: Secondary | ICD-10-CM | POA: Diagnosis not present

## 2020-05-18 DIAGNOSIS — M5432 Sciatica, left side: Secondary | ICD-10-CM | POA: Diagnosis not present

## 2020-05-20 LAB — CUP PACEART REMOTE DEVICE CHECK
Battery Remaining Longevity: 20 mo
Battery Remaining Percentage: 24 %
Battery Voltage: 2.8 V
Brady Statistic AP VP Percent: 5.9 %
Brady Statistic AP VS Percent: 1 %
Brady Statistic AS VP Percent: 93 %
Brady Statistic AS VS Percent: 1 %
Brady Statistic RA Percent Paced: 5.8 %
Date Time Interrogation Session: 20220301065956
HighPow Impedance: 80 Ohm
HighPow Impedance: 80 Ohm
Implantable Lead Implant Date: 20151119
Implantable Lead Implant Date: 20151119
Implantable Lead Implant Date: 20151119
Implantable Lead Location: 753858
Implantable Lead Location: 753859
Implantable Lead Location: 753860
Implantable Pulse Generator Implant Date: 20151119
Lead Channel Impedance Value: 410 Ohm
Lead Channel Impedance Value: 480 Ohm
Lead Channel Impedance Value: 580 Ohm
Lead Channel Pacing Threshold Amplitude: 0.5 V
Lead Channel Pacing Threshold Amplitude: 0.75 V
Lead Channel Pacing Threshold Amplitude: 1 V
Lead Channel Pacing Threshold Pulse Width: 0.5 ms
Lead Channel Pacing Threshold Pulse Width: 0.5 ms
Lead Channel Pacing Threshold Pulse Width: 0.5 ms
Lead Channel Sensing Intrinsic Amplitude: 12 mV
Lead Channel Sensing Intrinsic Amplitude: 3.2 mV
Lead Channel Setting Pacing Amplitude: 1.5 V
Lead Channel Setting Pacing Amplitude: 2 V
Lead Channel Setting Pacing Amplitude: 2.5 V
Lead Channel Setting Pacing Pulse Width: 0.5 ms
Lead Channel Setting Pacing Pulse Width: 0.5 ms
Lead Channel Setting Sensing Sensitivity: 0.5 mV
Pulse Gen Serial Number: 7211486

## 2020-05-24 DIAGNOSIS — M47817 Spondylosis without myelopathy or radiculopathy, lumbosacral region: Secondary | ICD-10-CM | POA: Diagnosis not present

## 2020-05-27 NOTE — Progress Notes (Signed)
Remote ICD transmission.   

## 2020-05-30 ENCOUNTER — Ambulatory Visit (INDEPENDENT_AMBULATORY_CARE_PROVIDER_SITE_OTHER): Payer: Medicare Other

## 2020-05-30 DIAGNOSIS — Z95810 Presence of automatic (implantable) cardiac defibrillator: Secondary | ICD-10-CM

## 2020-05-30 DIAGNOSIS — I5022 Chronic systolic (congestive) heart failure: Secondary | ICD-10-CM

## 2020-05-31 ENCOUNTER — Telehealth: Payer: Self-pay

## 2020-05-31 NOTE — Progress Notes (Signed)
EPIC Encounter for ICM Monitoring  Patient Name: Walter [name] is a 75 y.o. male Date: 05/31/2020 Primary Care Physican: London Pepper, MD Primary [place] Electrophysiologist: Allred Bi-V Pacing:98% 1/7/2022Weight:243-244lbs   Attempted call to patient and unable to reach.  Left detailed message per DPR regarding transmission. Transmission reviewed.   CorVuethoracic impedancesuggesting possible dryness starting 3/6 and trending toward baseline normal.  Furosemide20 mgTake 1 tablet (20 mg total) bymouthevery other day.  Labs: 04/15/2020 Creatinine 1.12, BUN 11, Potassium 4.0, Sodium 138 A complete set of results can be found in Results Review.  Recommendations:Left voice mail with ICM number and encouraged to call if experiencing any fluid symptoms.  Follow-up plan: ICM clinic phone appointment on 07/04/2020.91 day device clinic remote transmission6/03/2020.  EP/Cardiology Office Visits:8/8/2022with Dr. Rayann Heman.   Copy of ICM check sent to Dr.Allred.  3 month ICM trend: 05/28/2020.      Rosalene Billings, RN 05/31/2020 9:31 AM

## 2020-05-31 NOTE — Telephone Encounter (Signed)
Remote ICM transmission received.  Attempted call to patient regarding ICM remote transmission and left detailed message per DPR.  Advised to return call for any fluid symptoms or questions. Next ICM remote transmission scheduled 07/04/2020.

## 2020-06-07 DIAGNOSIS — M47817 Spondylosis without myelopathy or radiculopathy, lumbosacral region: Secondary | ICD-10-CM | POA: Diagnosis not present

## 2020-06-15 DIAGNOSIS — M5432 Sciatica, left side: Secondary | ICD-10-CM | POA: Diagnosis not present

## 2020-06-23 DIAGNOSIS — Z79899 Other long term (current) drug therapy: Secondary | ICD-10-CM | POA: Diagnosis not present

## 2020-06-23 DIAGNOSIS — M25559 Pain in unspecified hip: Secondary | ICD-10-CM | POA: Diagnosis not present

## 2020-06-23 DIAGNOSIS — M47817 Spondylosis without myelopathy or radiculopathy, lumbosacral region: Secondary | ICD-10-CM | POA: Diagnosis not present

## 2020-06-23 DIAGNOSIS — G894 Chronic pain syndrome: Secondary | ICD-10-CM | POA: Diagnosis not present

## 2020-06-23 DIAGNOSIS — M1710 Unilateral primary osteoarthritis, unspecified knee: Secondary | ICD-10-CM | POA: Diagnosis not present

## 2020-06-23 DIAGNOSIS — Z79891 Long term (current) use of opiate analgesic: Secondary | ICD-10-CM | POA: Diagnosis not present

## 2020-07-10 ENCOUNTER — Emergency Department (HOSPITAL_BASED_OUTPATIENT_CLINIC_OR_DEPARTMENT_OTHER)
Admission: EM | Admit: 2020-07-10 | Discharge: 2020-07-10 | Disposition: A | Discharge status: Home / Self Care | Payer: Medicare Other | Attending: Emergency Medicine | Admitting: Emergency Medicine

## 2020-07-10 ENCOUNTER — Encounter (HOSPITAL_BASED_OUTPATIENT_CLINIC_OR_DEPARTMENT_OTHER): Payer: Self-pay | Admitting: Emergency Medicine

## 2020-07-10 ENCOUNTER — Emergency Department (HOSPITAL_BASED_OUTPATIENT_CLINIC_OR_DEPARTMENT_OTHER): Payer: Medicare Other

## 2020-07-10 ENCOUNTER — Other Ambulatory Visit: Payer: Self-pay

## 2020-07-10 DIAGNOSIS — Z79899 Other long term (current) drug therapy: Secondary | ICD-10-CM | POA: Insufficient documentation

## 2020-07-10 DIAGNOSIS — E1169 Type 2 diabetes mellitus with other specified complication: Secondary | ICD-10-CM | POA: Diagnosis not present

## 2020-07-10 DIAGNOSIS — Z8547 Personal history of malignant neoplasm of testis: Secondary | ICD-10-CM | POA: Insufficient documentation

## 2020-07-10 DIAGNOSIS — Z95810 Presence of automatic (implantable) cardiac defibrillator: Secondary | ICD-10-CM | POA: Diagnosis not present

## 2020-07-10 DIAGNOSIS — I10 Essential (primary) hypertension: Secondary | ICD-10-CM | POA: Insufficient documentation

## 2020-07-10 DIAGNOSIS — Z96611 Presence of right artificial shoulder joint: Secondary | ICD-10-CM | POA: Diagnosis not present

## 2020-07-10 DIAGNOSIS — R197 Diarrhea, unspecified: Secondary | ICD-10-CM | POA: Insufficient documentation

## 2020-07-10 DIAGNOSIS — Z96643 Presence of artificial hip joint, bilateral: Secondary | ICD-10-CM | POA: Diagnosis not present

## 2020-07-10 DIAGNOSIS — Z8679 Personal history of other diseases of the circulatory system: Secondary | ICD-10-CM | POA: Diagnosis not present

## 2020-07-10 DIAGNOSIS — E785 Hyperlipidemia, unspecified: Secondary | ICD-10-CM | POA: Diagnosis not present

## 2020-07-10 DIAGNOSIS — Z7901 Long term (current) use of anticoagulants: Secondary | ICD-10-CM | POA: Diagnosis not present

## 2020-07-10 DIAGNOSIS — Z7984 Long term (current) use of oral hypoglycemic drugs: Secondary | ICD-10-CM | POA: Diagnosis not present

## 2020-07-10 DIAGNOSIS — R109 Unspecified abdominal pain: Secondary | ICD-10-CM | POA: Diagnosis not present

## 2020-07-10 DIAGNOSIS — I2510 Atherosclerotic heart disease of native coronary artery without angina pectoris: Secondary | ICD-10-CM | POA: Insufficient documentation

## 2020-07-10 DIAGNOSIS — K219 Gastro-esophageal reflux disease without esophagitis: Secondary | ICD-10-CM | POA: Diagnosis not present

## 2020-07-10 DIAGNOSIS — R1032 Left lower quadrant pain: Secondary | ICD-10-CM | POA: Diagnosis not present

## 2020-07-10 DIAGNOSIS — R112 Nausea with vomiting, unspecified: Secondary | ICD-10-CM

## 2020-07-10 DIAGNOSIS — Z87891 Personal history of nicotine dependence: Secondary | ICD-10-CM | POA: Insufficient documentation

## 2020-07-10 LAB — CBC
HCT: 60 % — ABNORMAL HIGH (ref 39.0–52.0)
Hemoglobin: 20.3 g/dL — ABNORMAL HIGH (ref 13.0–17.0)
MCH: 30.6 pg (ref 26.0–34.0)
MCHC: 33.8 g/dL (ref 30.0–36.0)
MCV: 90.5 fL (ref 80.0–100.0)
Platelets: 163 10*3/uL (ref 150–400)
RBC: 6.63 MIL/uL — ABNORMAL HIGH (ref 4.22–5.81)
RDW: 17.3 % — ABNORMAL HIGH (ref 11.5–15.5)
WBC: 6.2 10*3/uL (ref 4.0–10.5)
nRBC: 0 % (ref 0.0–0.2)

## 2020-07-10 LAB — URINALYSIS, ROUTINE W REFLEX MICROSCOPIC
Bilirubin Urine: NEGATIVE
Glucose, UA: NEGATIVE mg/dL
Ketones, ur: NEGATIVE mg/dL
Leukocytes,Ua: NEGATIVE
Nitrite: NEGATIVE
Protein, ur: NEGATIVE mg/dL
Specific Gravity, Urine: <1.005 — ABNORMAL LOW (ref 1.005–1.030)
pH: 5 (ref 5.0–8.0)

## 2020-07-10 LAB — COMPREHENSIVE METABOLIC PANEL
ALT: 42 U/L (ref 0–44)
AST: 33 U/L (ref 15–41)
Albumin: 4 g/dL (ref 3.5–5.0)
Alkaline Phosphatase: 49 U/L (ref 38–126)
Anion gap: 14 (ref 5–15)
BUN: 35 mg/dL — ABNORMAL HIGH (ref 8–23)
CO2: 16 mmol/L — ABNORMAL LOW (ref 22–32)
Calcium: 8.6 mg/dL — ABNORMAL LOW (ref 8.9–10.3)
Chloride: 97 mmol/L — ABNORMAL LOW (ref 98–111)
Creatinine, Ser: 1.55 mg/dL — ABNORMAL HIGH (ref 0.61–1.24)
GFR, Estimated: 46 mL/min — ABNORMAL LOW (ref >60)
Glucose, Bld: 150 mg/dL — ABNORMAL HIGH (ref 70–99)
Potassium: 3.5 mmol/L (ref 3.5–5.1)
Sodium: 127 mmol/L — ABNORMAL LOW (ref 135–145)
Total Bilirubin: 1.1 mg/dL (ref 0.3–1.2)
Total Protein: 7.3 g/dL (ref 6.5–8.1)

## 2020-07-10 LAB — URINALYSIS, MICROSCOPIC (REFLEX)

## 2020-07-10 LAB — LIPASE, BLOOD: Lipase: 31 U/L (ref 11–51)

## 2020-07-10 MED ORDER — LACTATED RINGERS IV BOLUS
1000.0000 mL | Freq: Once | INTRAVENOUS | Status: AC
  Administered 2020-07-10: 1000 mL via INTRAVENOUS

## 2020-07-10 MED ORDER — ONDANSETRON 4 MG PO TBDP: 4.0000 mg | ORAL_TABLET | Freq: Three times a day (TID) | ORAL | 0 refills | Status: AC | PRN

## 2020-07-10 MED ORDER — IOHEXOL 300 MG/ML  SOLN
100.0000 mL | Freq: Once | INTRAMUSCULAR | Status: AC
  Administered 2020-07-10: 100 mL via INTRAVENOUS

## 2020-07-10 MED ORDER — ONDANSETRON HCL 4 MG/2ML IJ SOLN
4.0000 mg | Freq: Once | INTRAMUSCULAR | Status: AC
  Administered 2020-07-10: 4 mg via INTRAVENOUS
  Filled 2020-07-10: qty 2

## 2020-07-10 NOTE — ED Provider Notes (Signed)
[facility] EMERGENCY DEPARTMENT Provider Note   CSN: [id number] Arrival date & time: 07/10/20  1629     History Chief Complaint  Patient presents with  . Abdominal Pain    Walter [name] is a 75 y.o. male.  75 year old male with extensive past medical history below including atrial fibrillation, factor V Leiden on anticoagulation, CAD, hypertension, hyperlipidemia, chronic arthritis pain on chronic medication who presents with vomiting, diarrhea, and abdominal pain.  4 days ago, patient began having nausea and vomiting associated with nonbloody diarrhea.  Symptoms have persisted and he continues to have multiple bouts of diarrhea.  His wife had a similar illness 1 week ago and was in the ED for it.  He reports associated abdominal pain across his central abdomen.  He is not aware of any fevers.  No urinary symptoms.  No recent travel or antibiotic use.  He was having some low back pain 2 days ago and took some of his prescription pain medication.  Back pain seems to have eased off since then.  The history is provided by the patient.  Abdominal Pain      Past Medical History:  Diagnosis Date  . AICD (automatic cardioverter/defibrillator) present   . Anxiety   . Asbestosis ([place])    mild  . Atrial fibrillation ([place])   . Chronic low back pain    /sciatica- [facility])  . Complication of anesthesia 1996   previous surgery had a run of v-tach (for gallbladder-1990's)-cannot lie completely flat and panis when oxygen mask placed over face  . Coronary artery disease, non-occlusive June 2011   Cardiac cath in [facility] following abnormal Myoview  . Delirium 05/06/2019  . DJD (degenerative joint disease)    /osteoarthritis  . Dyslipidemia, goal LDL below 100    On lovastatin (myalgias with Zocor and Lipitor)  . Dyspnea    worse in hot weather  . Dysrhythmia    LBBB, in 2015-atrial fibrillation  . Essential hypertension   . Factor V Leiden  [facility])    sees Dr. Cherrie Gauze Marrow- no problems with this  . Frozen shoulder    right  . GERD (gastroesophageal reflux disease)   . H/O asbestosis    when in [employer]-1964-1970,1971-1986 in [employer]-diagnosed in 1990  . History of kidney stones   . Hypogonadism male   . Irritable bowel   . Kidney stone   . Left bundle branch block (LBBB) on electrocardiogram    Diagnosed close to 20 years ago  . Myocardial infarction ([place])   . Non-ischemic cardiomyopathy [facility]) June 2011   EF 45-50%; suspect related to LBBB; repeat Echo March 2015: EF 50- 55%  . Obesity   . PONV (postoperative nausea and vomiting)   . Restless leg syndrome   . Rotator cuff arthropathy of right shoulder 05/14/2017  . Sleep apnea   . Testicular cancer ([place]) 1972   left  . Tremor, essential 12/09/2015  . Type 2 diabetes mellitus without complication ([place]) 08/6292   Type 2.   . Wears glasses     Patient Active Problem List   Diagnosis Date Noted  . Acute diverticulitis 10/08/2019  . Diverticulitis 10/08/2019  . Osteoarthritis of left hip 05/26/2019  . S/P total hip arthroplasty 05/26/2019  . Delirium 05/06/2019  . Rotator cuff arthropathy of right shoulder 05/14/2017  . Primary localized osteoarthrosis of shoulder 05/14/2017  . Tremor, essential 12/09/2015  . DJD (degenerative joint disease) 02/22/2015  . Primary osteoarthritis of  right hip 02/22/2015  . Status post total replacement of right hip 02/22/2015  . Paroxysmal atrial fibrillation ([place]) 02/17/2014  . Chronic systolic dysfunction of left ventricle 01/16/2014  . Left bundle branch block 01/16/2014  . Long term (current) use of anticoagulants 10/20/2013  . Esophageal spasm 10/15/2013  . Cardiomyopathy, nonischemic ([place]) 10/12/2013  . Abnormal cardiovascular function study 10/11/2013  . Persistent atrial fibrillation ([place]) 10/09/2013  . Nonischemic cardiomyopathy ([place]) 05/18/2013  . Essential hypertension, benign 05/12/2013  . Restless leg syndrome  05/12/2013  . Insomnia 05/12/2013  . Palpitations 05/12/2013  . Encounter for long-term (current) use of other medications 05/12/2013  . Non-occlusive coronary artery disease 05/12/2013    Past Surgical History:  Procedure Laterality Date  . APPENDECTOMY N/A   . BI-VENTRICULAR IMPLANTABLE CARDIOVERTER DEFIBRILLATOR N/A 02/04/2014   SJM Quadra Assura BiV ICD implanted for primary prevention by [name]  . CARDIAC CATHETERIZATION  June 2011   Nonobstructive CAD  . CARDIAC DEFIBRILLATOR PLACEMENT  02/04/14   [facility] model [place] 3365-40Q (serial Number [id number]) biventricular ICD.  Marland Kitchen CARDIOVERSION N/A 10/13/2013   Procedure: CARDIOVERSION;  Surgeon: Larey Dresser, MD;  Location: [facility];  Service: Cardiovascular;  Laterality: N/A;  . CARDIOVERSION  10-16-2013   DCCV by [name] following intracardiac echo to rule out LAA thrombus  . CARDIOVERSION N/A 10/16/2013   Procedure: CARDIOVERSION;  Surgeon: Coralyn Mark, MD;  Location: [facility] CATH LAB;  Service: Cardiovascular;  Laterality: N/A;  . CHOLECYSTECTOMY OPEN    . COLONOSCOPY WITH PROPOFOL N/A 12/04/2019   Procedure: COLONOSCOPY WITH PROPOFOL;  Surgeon: Wonda Horner, MD;  Location: [facility] ENDOSCOPY;  Service: Endoscopy;  Laterality: N/A;  . ELECTROPHYSIOLOGY STUDY N/A 10/16/2013   Procedure: ELECTROPHYSIOLOGY STUDY;  Surgeon: Coralyn Mark, MD;  Location: [place] CATH LAB;  Service: Cardiovascular;  Laterality: N/A;  . ESOPHAGOGASTRODUODENOSCOPY N/A 10/15/2013   Procedure: ESOPHAGOGASTRODUODENOSCOPY (EGD);  Surgeon: Gatha Mayer, MD;  Location: [facility] ENDOSCOPY;  Service: Endoscopy;  Laterality: N/A;  bedside  . HERNIA REPAIR    . KNEE ARTHROSCOPY Left   . LEFT AND RIGHT HEART CATHETERIZATION WITH CORONARY ANGIOGRAM N/A 10/12/2013   Procedure: LEFT AND RIGHT HEART CATHETERIZATION WITH CORONARY ANGIOGRAM;  Surgeon: Troy Sine, MD;  Location: [facility] CATH LAB;  Service: Cardiovascular;  Laterality: N/A;  . lymph nodes    .  RADIOFREQUENCY ABLATION NERVES     Back every 6 months  . REVERSE SHOULDER ARTHROPLASTY Right 05/14/2017   Procedure: TOTAL REVERSE SHOULDER ARTHROPLASTY;  Surgeon: Marchia Bond, MD;  Location: [place];  Service: Orthopedics;  Laterality: Right;  . ROTATOR CUFF REPAIR Right   . SURGERY SCROTAL / TESTICULAR Left   . TONSILLECTOMY Bilateral   . TOTAL HIP ARTHROPLASTY Right 02/22/2015   Procedure: RIGHT TOTAL HIP ARTHROPLASTY ANTERIOR APPROACH;  Surgeon: Renette Butters, MD;  Location: [name];  Service: Orthopedics;  Laterality: Right;  . TOTAL HIP ARTHROPLASTY Left 05/26/2019   Procedure: TOTAL HIP ARTHROPLASTY;  Surgeon: Marchia Bond, MD;  Location: [facility] ORS;  Service: Orthopedics;  Laterality: Left;  . TRANSTHORACIC ECHOCARDIOGRAM  May 2011   EF 45% with diffuse hypokinesis; septal bounce from LBBB  . TRANSTHORACIC ECHOCARDIOGRAM  March 2015   EF 50-55%. Mild concentric LVH. Septal dyssynergy from LBBB        Family History  Problem Relation Age of Onset  . Hypertension Mother   . Heart disease Mother   . Heart disease Father   . Hypertension Brother   .  Heart attack Maternal Uncle   . Hypertension Brother   . Heart disease Paternal Uncle     Social History   Tobacco Use  . Smoking status: Former Smoker    Types: Cigarettes    Quit date: 02/10/1973    Years since quitting: 47.4  . Smokeless tobacco: Never Used  Vaping Use  . Vaping Use: Never used  Substance Use Topics  . Alcohol use: Yes    Comment: rare  . Drug use: No    Home Medications Prior to Admission medications   Medication Sig Start Date End Date Taking? Authorizing Provider  ondansetron (ZOFRAN ODT) 4 MG disintegrating tablet Take 1 tablet (4 mg total) by mouth every 8 (eight) hours as needed for nausea or vomiting. 07/10/20  Yes Johnnie Moten, Wenda Overland, MD  albuterol (PROVENTIL HFA;VENTOLIN HFA) 108 (90 Base) MCG/ACT inhaler Inhale 1-2 puffs into the lungs every 6 (six) hours as needed for wheezing or shortness  of breath.    [provider]  ALTOPREV 60 MG 24 hr tablet Take 60 mg by mouth daily.  09/25/19   [provider]  carvedilol (COREG) 12.5 MG tablet Take 12.5 mg by mouth 2 (two) times daily with a meal.    [provider]  cholecalciferol (VITAMIN D) 25 MCG (1000 UNIT) tablet Take 1,000 Units by mouth daily.    [provider]  Cyanocobalamin (VITAMIN B-12) 2500 MCG SUBL Place 2,500 mcg under the tongue daily.    [provider]  ELIQUIS 5 MG TABS tablet TAKE 1 TABLET TWICE A DAY 04/15/20   Larey Dresser, MD  empagliflozin (JARDIANCE) 10 MG TABS tablet Take 10 mg by mouth daily.    [provider]  furosemide (LASIX) 20 MG tablet Take 1 tablet (20 mg total) by mouth every other day. 08/10/19   Larey Dresser, MD  gabapentin (NEURONTIN) 300 MG capsule Take 300-600 mg by mouth daily as needed (pain).     [provider]  lisinopril (ZESTRIL) 10 MG tablet Take 10 mg by mouth daily.     [provider]  melatonin 5 MG TABS Take 10-15 mg by mouth at bedtime as needed (Sleep).     [provider]  metFORMIN (GLUCOPHAGE) 500 MG tablet Take 1,000 mg by mouth 2 (two) times daily with a meal.     [provider]  morphine (MSIR) 15 MG tablet Take 15 mg by mouth 4 (four) times daily. 10/05/19   [provider]  omega-3 acid ethyl esters (LOVAZA) 1 G capsule Take 1 g by mouth 2 (two) times daily.     [provider]  pantoprazole (PROTONIX) 40 MG tablet Take 40 mg by mouth daily.    [provider]  rOPINIRole (REQUIP) 1 MG tablet Take 1 mg by mouth at bedtime.  11/28/15   [provider]  sildenafil (VIAGRA) 100 MG tablet Take 100 mg by mouth daily as needed for erectile dysfunction.    [provider]  spironolactone (ALDACTONE) 25 MG tablet TAKE 1 TABLET DAILY IN THE MORNING 04/15/20   Larey Dresser, MD  testosterone cypionate (DEPOTESTOSTERONE CYPIONATE) 200 MG/ML  injection Inject 200 mg into the muscle See admin instructions. into the muscle every 10 days    [provider]  tiZANidine (ZANAFLEX) 4 MG tablet Take 4 mg by mouth every 6 (six) hours as needed for muscle spasms.    [provider]    Allergies    Statins and Cortisone  Review of Systems   Review of Systems  Gastrointestinal: Positive for abdominal pain.   All other systems reviewed and are negative except that which was mentioned in HPI  Physical Exam Updated Vital Signs BP 125/79 (BP Location: Right Arm)   Pulse 97   Temp 97.9 F (36.6 C) (Oral)   Resp 19   Ht 6' (1.829 m)   Wt 108.4 kg   SpO2 96%   BMI 32.41 kg/m   Physical Exam Vitals and nursing note reviewed.  Constitutional:      General: He is not in acute distress.    Appearance: Normal appearance.     Comments: uncomfortable  HENT:     Head: Normocephalic and atraumatic.     Mouth/Throat:     Comments: Dry mouth Eyes:     Conjunctiva/sclera: Conjunctivae normal.  Cardiovascular:     Rate and Rhythm: Normal rate. Rhythm irregular.     Heart sounds: Normal heart sounds. No murmur heard.   Pulmonary:     Effort: Pulmonary effort is normal.     Breath sounds: Normal breath sounds.  Abdominal:     General: Abdomen is flat. Bowel sounds are normal. There is no distension.     Palpations: Abdomen is soft.     Tenderness: There is generalized abdominal tenderness.  Musculoskeletal:     Right lower leg: No edema.     Left lower leg: No edema.  Skin:    General: Skin is warm and dry.  Neurological:     Mental Status: He is alert and oriented to person, place, and time.     Comments: fluent  Psychiatric:        Mood and Affect: Mood normal.        Behavior: Behavior normal.     ED Results / Procedures / Treatments   Labs (all labs ordered are listed, but only abnormal results are displayed) Labs Reviewed  COMPREHENSIVE METABOLIC PANEL - Abnormal; Notable for the following  components:      Result Value   Sodium 127 (*)    Chloride 97 (*)    CO2 16 (*)    Glucose, Bld 150 (*)    BUN 35 (*)    Creatinine, Ser 1.55 (*)    Calcium 8.6 (*)    GFR, Estimated 46 (*)    All other components within normal limits  CBC - Abnormal; Notable for the following components:   RBC 6.63 (*)    Hemoglobin 20.3 (*)    HCT 60.0 (*)    RDW 17.3 (*)    All other components within normal limits  URINALYSIS, ROUTINE W REFLEX MICROSCOPIC - Abnormal; Notable for the following components:   Specific Gravity, Urine <1.005 (*)    Hgb urine dipstick TRACE (*)    All other components within normal limits  URINALYSIS, MICROSCOPIC (REFLEX) - Abnormal; Notable for the following components:   Bacteria, UA RARE (*)    All other components within normal limits  GASTROINTESTINAL PANEL BY PCR, STOOL (REPLACES STOOL CULTURE)  LIPASE, BLOOD    EKG EKG Interpretation  Date/Time:  Sunday July 10 2020 16:55:05 EDT Ventricular Rate:  112 PR Interval:  160 QRS Duration: 112 QT Interval:  346 QTC Calculation: 472 R Axis:   -67 Text Interpretation: Atrial-sensed ventricular-paced rhythm Biventricular pacemaker detected Abnormal ECG rate faster than previous Confirmed by Theotis Burrow [phone]) on 07/10/2020 5:02:08 PM   Radiology CT Abdomen Pelvis W Contrast  Result Date: 07/10/2020 CLINICAL DATA:  Abdominal pain, concern for diverticulitis. EXAM: CT ABDOMEN AND PELVIS WITH CONTRAST TECHNIQUE: Multidetector CT imaging of the abdomen and pelvis was performed using the standard protocol following bolus administration of intravenous contrast. CONTRAST:  171mL OMNIPAQUE IOHEXOL 300 MG/ML  SOLN COMPARISON:  CT abdomen pelvis dated 10/08/2019. FINDINGS: Lower chest: Calcified pleural plaque is unchanged. Hepatobiliary: No focal liver abnormality is seen. Status post cholecystectomy. No biliary dilatation. Pancreas: Unremarkable. No pancreatic ductal dilatation or surrounding inflammatory changes.  Spleen: Normal in size without focal abnormality. Adrenals/Urinary Tract: A right adrenal adenoma measuring 1.7 cm is unchanged. Kidneys are normal, without renal calculi, focal lesion, or hydronephrosis. Bladder is obscured by streak artifact. Stomach/Bowel: Stomach is within normal limits. No pericecal inflammatory changes are noted to suggest acute appendicitis. There is colonic diverticulosis without evidence of diverticulitis. No evidence of bowel wall thickening, distention, or inflammatory changes. Vascular/Lymphatic: Aortic atherosclerosis. No enlarged abdominal or pelvic lymph nodes. Reproductive: The prostate is obscured by streak artifact. Other: A fat containing right inguinal hernia is unchanged. No free intraperitoneal fluid. Musculoskeletal: Bilateral hip arthroplasties result in streak artifact through the pelvis. Degenerative changes are seen in the spine. IMPRESSION: 1. No acute findings in the abdomen or pelvis. 2. Colonic diverticulosis without evidence of diverticulitis. Aortic Atherosclerosis (ICD10-I70.0). Electronically Signed   By: Zerita Boers M.D.   On: 07/10/2020 19:05    Procedures Procedures   Medications Ordered in ED Medications  ondansetron (ZOFRAN) injection 4 mg (4 mg Intravenous Given 07/10/20 1714)  lactated ringers bolus 1,000 mL (0 mLs Intravenous Stopped 07/10/20 1936)  iohexol (OMNIPAQUE) 300 MG/ML solution 100 mL (100 mLs Intravenous Contrast Given 07/10/20 1835)    ED Course  I have reviewed the triage vital signs and the nursing notes.  Pertinent labs & imaging results that were available during my care of the patient were reviewed by me and considered in my medical decision making (see chart for details).    MDM Rules/Calculators/A&P                          Tachycardic on exam but reassuring BP and afebrile.  He had generalized abdominal tenderness.  Lab work today is suggestive of dehydration with sodium 127, chloride 97, CO2 16, glucose 150,  creatinine 1.55, BUN 35, normal WBC count, hemoglobin 20.3 which likely reflects hemoconcentration in the setting of dehydration.  Gave fluid bolus.  Because of his abdominal tenderness, obtained CT to evaluate for diverticulitis or abscess.  CT reassuring with no acute findings including no diverticulitis.  Patient tolerating Gatorade on reassessment with normal vital signs.  He continues to have diarrhea which is nonbloody.  Have sent GI pathogen panel.  Given his reassuring work-up I do not feel he needs antibiotics at this time but have recommended that he follow-up with PCP in a few days.  I discussed supportive measures for diarrhea and extensively reviewed return precautions with patient and his wife.  They voiced understanding. Final Clinical Impression(s) / ED Diagnoses Final diagnoses:  Nausea vomiting and diarrhea  Abdominal pain, unspecified abdominal location    Rx / DC Orders ED Discharge Orders         Ordered    ondansetron (ZOFRAN ODT) 4 MG disintegrating tablet  Every 8 hours PRN        07/10/20 2159           Keyuna Cuthrell, Wenda Overland, MD 07/11/20 0000

## 2020-07-10 NOTE — ED Triage Notes (Signed)
Pt arrives pov with driver, c/o Abdominal pain with N/V/D and inability to hodld down food or fluidsx 3 days. Pt denies fever

## 2020-07-10 NOTE — ED Notes (Signed)
Patient transported to CT 

## 2020-07-11 ENCOUNTER — Ambulatory Visit (INDEPENDENT_AMBULATORY_CARE_PROVIDER_SITE_OTHER): Payer: Medicare Other

## 2020-07-11 DIAGNOSIS — I5022 Chronic systolic (congestive) heart failure: Secondary | ICD-10-CM | POA: Diagnosis not present

## 2020-07-11 DIAGNOSIS — Z95810 Presence of automatic (implantable) cardiac defibrillator: Secondary | ICD-10-CM

## 2020-07-11 LAB — GASTROINTESTINAL PANEL BY PCR, STOOL (REPLACES STOOL CULTURE)

## 2020-07-12 ENCOUNTER — Telehealth: Payer: Self-pay

## 2020-07-12 NOTE — Telephone Encounter (Signed)
Remote ICM transmission received.  Attempted call to patient regarding ICM remote transmission and left detailed message per DPR and to return call.  Advised send updated remote transmission from home today for review.

## 2020-07-12 NOTE — Progress Notes (Signed)
EPIC Encounter for ICM Monitoring  Patient Name: Walter [name] is a 75 y.o. male Date: 07/12/2020 Primary Care Physican: London Pepper, MD Primary [place] Electrophysiologist: Allred Bi-V Pacing:98% 1/7/2022Weight:243-244lbs   Attempted call to patient and unable to reach.  Left detailed message per DPR regarding transmission. Transmission reviewed.   CorVuethoracic impedancesuggesting possible dryness starting 4/22 which correlates ER visit for nausea, vomiting and diarrhea  Furosemide20 mgTake 1 tablet (20 mg total) bymouthevery other day.  Labs: 04/15/2020 Creatinine 1.12, BUN 11, Potassium 4.0, Sodium 138 A complete set of results can be found in Results Review.  Recommendations:Left voice mail with ICM number and encouraged to call if experiencing any fluid symptoms.  Follow-up plan: ICM clinic phone appointment on5/06/2020 to recheck fluid levels.91 day device clinic remote transmission6/03/2020.  EP/Cardiology Office Visits:8/8/2022with Dr. Rayann Heman.   Copy of ICM check sent to Dr.Allred.  3 month ICM trend: 07/10/2020.    1 Year ICM trend:       Rosalene Billings, RN 07/12/2020 12:04 PM

## 2020-07-21 DIAGNOSIS — M47817 Spondylosis without myelopathy or radiculopathy, lumbosacral region: Secondary | ICD-10-CM | POA: Diagnosis not present

## 2020-07-21 DIAGNOSIS — G894 Chronic pain syndrome: Secondary | ICD-10-CM | POA: Diagnosis not present

## 2020-07-21 DIAGNOSIS — M1990 Unspecified osteoarthritis, unspecified site: Secondary | ICD-10-CM | POA: Diagnosis not present

## 2020-07-21 DIAGNOSIS — M25559 Pain in unspecified hip: Secondary | ICD-10-CM | POA: Diagnosis not present

## 2020-07-22 NOTE — Progress Notes (Signed)
No ICM remote transmission received for 07/20/2020 and next ICM transmission scheduled for 08/29/2020.

## 2020-08-01 ENCOUNTER — Encounter (HOSPITAL_BASED_OUTPATIENT_CLINIC_OR_DEPARTMENT_OTHER): Payer: Self-pay | Admitting: *Deleted

## 2020-08-01 ENCOUNTER — Emergency Department (HOSPITAL_BASED_OUTPATIENT_CLINIC_OR_DEPARTMENT_OTHER)
Admission: EM | Admit: 2020-08-01 | Discharge: 2020-08-01 | Disposition: A | Discharge status: Home / Self Care | Payer: Medicare Other | Attending: Emergency Medicine | Admitting: Emergency Medicine

## 2020-08-01 ENCOUNTER — Emergency Department (HOSPITAL_BASED_OUTPATIENT_CLINIC_OR_DEPARTMENT_OTHER): Payer: Medicare Other

## 2020-08-01 ENCOUNTER — Other Ambulatory Visit: Payer: Self-pay

## 2020-08-01 DIAGNOSIS — X58XXXA Exposure to other specified factors, initial encounter: Secondary | ICD-10-CM | POA: Diagnosis not present

## 2020-08-01 DIAGNOSIS — S99911A Unspecified injury of right ankle, initial encounter: Secondary | ICD-10-CM | POA: Diagnosis present

## 2020-08-01 DIAGNOSIS — Z7901 Long term (current) use of anticoagulants: Secondary | ICD-10-CM | POA: Insufficient documentation

## 2020-08-01 DIAGNOSIS — I2510 Atherosclerotic heart disease of native coronary artery without angina pectoris: Secondary | ICD-10-CM | POA: Insufficient documentation

## 2020-08-01 DIAGNOSIS — Z96611 Presence of right artificial shoulder joint: Secondary | ICD-10-CM | POA: Insufficient documentation

## 2020-08-01 DIAGNOSIS — Z79899 Other long term (current) drug therapy: Secondary | ICD-10-CM | POA: Insufficient documentation

## 2020-08-01 DIAGNOSIS — M1712 Unilateral primary osteoarthritis, left knee: Secondary | ICD-10-CM | POA: Diagnosis not present

## 2020-08-01 DIAGNOSIS — I10 Essential (primary) hypertension: Secondary | ICD-10-CM | POA: Insufficient documentation

## 2020-08-01 DIAGNOSIS — Z955 Presence of coronary angioplasty implant and graft: Secondary | ICD-10-CM | POA: Insufficient documentation

## 2020-08-01 DIAGNOSIS — Z7984 Long term (current) use of oral hypoglycemic drugs: Secondary | ICD-10-CM | POA: Diagnosis not present

## 2020-08-01 DIAGNOSIS — I4891 Unspecified atrial fibrillation: Secondary | ICD-10-CM | POA: Insufficient documentation

## 2020-08-01 DIAGNOSIS — Z96643 Presence of artificial hip joint, bilateral: Secondary | ICD-10-CM | POA: Diagnosis not present

## 2020-08-01 DIAGNOSIS — E119 Type 2 diabetes mellitus without complications: Secondary | ICD-10-CM | POA: Insufficient documentation

## 2020-08-01 DIAGNOSIS — Z87891 Personal history of nicotine dependence: Secondary | ICD-10-CM | POA: Diagnosis not present

## 2020-08-01 DIAGNOSIS — Z8547 Personal history of malignant neoplasm of testis: Secondary | ICD-10-CM | POA: Diagnosis not present

## 2020-08-01 DIAGNOSIS — Z95810 Presence of automatic (implantable) cardiac defibrillator: Secondary | ICD-10-CM | POA: Diagnosis not present

## 2020-08-01 DIAGNOSIS — M7989 Other specified soft tissue disorders: Secondary | ICD-10-CM | POA: Diagnosis not present

## 2020-08-01 DIAGNOSIS — S93401A Sprain of unspecified ligament of right ankle, initial encounter: Secondary | ICD-10-CM | POA: Diagnosis not present

## 2020-08-01 DIAGNOSIS — M19072 Primary osteoarthritis, left ankle and foot: Secondary | ICD-10-CM | POA: Diagnosis not present

## 2020-08-01 DIAGNOSIS — M7752 Other enthesopathy of left foot: Secondary | ICD-10-CM | POA: Diagnosis not present

## 2020-08-01 NOTE — ED Provider Notes (Signed)
[facility] EMERGENCY DEPARTMENT Provider Note   CSN: [id number] Arrival date & time: 08/01/20  0845     History Chief Complaint  Patient presents with  . Leg Injury    EDILBERTO [name] is a 75 y.o. male.  He has a history of A. fib and is on anticoagulation.  Complaining of left ankle foot pain for almost a week.  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  Has been using an Ace bandage due to swelling.  No fevers chills nausea vomiting.  No numbness or weakness.  The history is provided by the patient.  Leg Pain Location:  Ankle Ankle location:  L ankle Pain details:    Quality:  Aching   Severity:  Moderate   Onset quality:  Gradual   Timing:  Constant   Progression:  Worsening Chronicity:  New Relieved by:  Nothing Worsened by:  Bearing weight Ineffective treatments:  Compression Associated symptoms: swelling   Associated symptoms: no back pain, no fever, no numbness and no tingling        Past Medical History:  Diagnosis Date  . AICD (automatic cardioverter/defibrillator) present   . Anxiety   . Asbestosis ([place])    mild  . Atrial fibrillation ([place])   . Chronic low back pain    /sciatica- Dr Letta Median [facility])  . Complication of anesthesia 1996   previous surgery had a run of v-tach (for gallbladder-1990's)-cannot lie completely flat and panis when oxygen mask placed over face  . Coronary artery disease, non-occlusive June 2011   Cardiac cath in [facility] following abnormal Myoview  . Delirium 05/06/2019  . DJD (degenerative joint disease)    /osteoarthritis  . Dyslipidemia, goal LDL below 100    On lovastatin (myalgias with Zocor and Lipitor)  . Dyspnea    worse in hot weather  . Dysrhythmia    LBBB, in 2015-atrial fibrillation  . Essential hypertension   . Factor V Leiden [facility])    sees Dr. Cherrie Gauze Marrow- no problems with this  . Frozen shoulder    right  . GERD (gastroesophageal reflux  disease)   . H/O asbestosis    when in [employer]-1964-1970,1971-1986 in [employer]-diagnosed in 1990  . History of kidney stones   . Hypogonadism male   . Irritable bowel   . Kidney stone   . Left bundle branch block (LBBB) on electrocardiogram    Diagnosed close to 20 years ago  . Myocardial infarction ([place])   . Non-ischemic cardiomyopathy [facility]) June 2011   EF 45-50%; suspect related to LBBB; repeat Echo March 2015: EF 50- 55%  . Obesity   . PONV (postoperative nausea and vomiting)   . Restless leg syndrome   . Rotator cuff arthropathy of right shoulder 05/14/2017  . Sleep apnea   . Testicular cancer ([place]) 1972   left  . Tremor, essential 12/09/2015  . Type 2 diabetes mellitus without complication ([place]) 03/7406   Type 2.   . Wears glasses     Patient Active Problem List   Diagnosis Date Noted  . Acute diverticulitis 10/08/2019  . Diverticulitis 10/08/2019  . Osteoarthritis of left hip 05/26/2019  . S/P total hip arthroplasty 05/26/2019  . Delirium 05/06/2019  . Rotator cuff arthropathy of right shoulder 05/14/2017  . Primary localized osteoarthrosis of shoulder 05/14/2017  . Tremor, essential 12/09/2015  . DJD (degenerative joint disease) 02/22/2015  . Primary osteoarthritis of right hip 02/22/2015  .  Status post total replacement of right hip 02/22/2015  . Paroxysmal atrial fibrillation ([place]) 02/17/2014  . Chronic systolic dysfunction of left ventricle 01/16/2014  . Left bundle branch block 01/16/2014  . Long term (current) use of anticoagulants 10/20/2013  . Esophageal spasm 10/15/2013  . Cardiomyopathy, nonischemic ([place]) 10/12/2013  . Abnormal cardiovascular function study 10/11/2013  . Persistent atrial fibrillation ([place]) 10/09/2013  . Nonischemic cardiomyopathy ([place]) 05/18/2013  . Essential hypertension, benign 05/12/2013  . Restless leg syndrome 05/12/2013  . Insomnia 05/12/2013  . Palpitations 05/12/2013  . Encounter for long-term (current) use of other  medications 05/12/2013  . Non-occlusive coronary artery disease 05/12/2013    Past Surgical History:  Procedure Laterality Date  . APPENDECTOMY N/A   . BI-VENTRICULAR IMPLANTABLE CARDIOVERTER DEFIBRILLATOR N/A 02/04/2014   SJM Quadra Assura BiV ICD implanted for primary prevention by [name]  . CARDIAC CATHETERIZATION  June 2011   Nonobstructive CAD  . CARDIAC DEFIBRILLATOR PLACEMENT  02/04/14   [facility] model [place] 3365-40Q (serial Number [id number]) biventricular ICD.  Marland Kitchen CARDIOVERSION N/A 10/13/2013   Procedure: CARDIOVERSION;  Surgeon: Larey Dresser, MD;  Location: [facility];  Service: Cardiovascular;  Laterality: N/A;  . CARDIOVERSION  10-16-2013   DCCV by [name] following intracardiac echo to rule out LAA thrombus  . CARDIOVERSION N/A 10/16/2013   Procedure: CARDIOVERSION;  Surgeon: Coralyn Mark, MD;  Location: [facility] CATH LAB;  Service: Cardiovascular;  Laterality: N/A;  . CHOLECYSTECTOMY OPEN    . COLONOSCOPY WITH PROPOFOL N/A 12/04/2019   Procedure: COLONOSCOPY WITH PROPOFOL;  Surgeon: Wonda Horner, MD;  Location: [facility] ENDOSCOPY;  Service: Endoscopy;  Laterality: N/A;  . ELECTROPHYSIOLOGY STUDY N/A 10/16/2013   Procedure: ELECTROPHYSIOLOGY STUDY;  Surgeon: Coralyn Mark, MD;  Location: [place] CATH LAB;  Service: Cardiovascular;  Laterality: N/A;  . ESOPHAGOGASTRODUODENOSCOPY N/A 10/15/2013   Procedure: ESOPHAGOGASTRODUODENOSCOPY (EGD);  Surgeon: Gatha Mayer, MD;  Location: [facility] ENDOSCOPY;  Service: Endoscopy;  Laterality: N/A;  bedside  . HERNIA REPAIR    . KNEE ARTHROSCOPY Left   . LEFT AND RIGHT HEART CATHETERIZATION WITH CORONARY ANGIOGRAM N/A 10/12/2013   Procedure: LEFT AND RIGHT HEART CATHETERIZATION WITH CORONARY ANGIOGRAM;  Surgeon: Troy Sine, MD;  Location: [facility] CATH LAB;  Service: Cardiovascular;  Laterality: N/A;  . lymph nodes    . RADIOFREQUENCY ABLATION NERVES     Back every 6 months  . REVERSE SHOULDER ARTHROPLASTY Right 05/14/2017   Procedure:  TOTAL REVERSE SHOULDER ARTHROPLASTY;  Surgeon: Marchia Bond, MD;  Location: [facility];  Service: Orthopedics;  Laterality: Right;  . ROTATOR CUFF REPAIR Right   . SURGERY SCROTAL / TESTICULAR Left   . TONSILLECTOMY Bilateral   . TOTAL HIP ARTHROPLASTY Right 02/22/2015   Procedure: RIGHT TOTAL HIP ARTHROPLASTY ANTERIOR APPROACH;  Surgeon: Renette Butters, MD;  Location: [place];  Service: Orthopedics;  Laterality: Right;  . TOTAL HIP ARTHROPLASTY Left 05/26/2019   Procedure: TOTAL HIP ARTHROPLASTY;  Surgeon: Marchia Bond, MD;  Location: [facility] ORS;  Service: Orthopedics;  Laterality: Left;  . TRANSTHORACIC ECHOCARDIOGRAM  May 2011   EF 45% with diffuse hypokinesis; septal bounce from LBBB  . TRANSTHORACIC ECHOCARDIOGRAM  March 2015   EF 50-55%. Mild concentric LVH. Septal dyssynergy from LBBB        Family History  Problem Relation Age of Onset  . Hypertension Mother   . Heart disease Mother   . Heart disease Father   . Hypertension Brother   . Heart attack Maternal  Uncle   . Hypertension Brother   . Heart disease Paternal Uncle     Social History   Tobacco Use  . Smoking status: Former Smoker    Types: Cigarettes    Quit date: 02/10/1973    Years since quitting: 47.5  . Smokeless tobacco: Never Used  Vaping Use  . Vaping Use: Never used  Substance Use Topics  . Alcohol use: Yes    Comment: rare  . Drug use: No    Home Medications Prior to Admission medications   Medication Sig Start Date End Date Taking? Authorizing Provider  albuterol (PROVENTIL HFA;VENTOLIN HFA) 108 (90 Base) MCG/ACT inhaler Inhale 1-2 puffs into the lungs every 6 (six) hours as needed for wheezing or shortness of breath.    [provider]  ALTOPREV 60 MG 24 hr tablet Take 60 mg by mouth daily.  09/25/19   [provider]  carvedilol (COREG) 12.5 MG tablet Take 12.5 mg by mouth 2 (two) times daily with a meal.    [provider]  cholecalciferol (VITAMIN D) 25 MCG (1000 UNIT)  tablet Take 1,000 Units by mouth daily.    [provider]  Cyanocobalamin (VITAMIN B-12) 2500 MCG SUBL Place 2,500 mcg under the tongue daily.    [provider]  ELIQUIS 5 MG TABS tablet TAKE 1 TABLET TWICE A DAY 04/15/20   Larey Dresser, MD  empagliflozin (JARDIANCE) 10 MG TABS tablet Take 10 mg by mouth daily.    [provider]  furosemide (LASIX) 20 MG tablet Take 1 tablet (20 mg total) by mouth every other day. 08/10/19   Larey Dresser, MD  gabapentin (NEURONTIN) 300 MG capsule Take 300-600 mg by mouth daily as needed (pain).     [provider]  lisinopril (ZESTRIL) 10 MG tablet Take 10 mg by mouth daily.     [provider]  melatonin 5 MG TABS Take 10-15 mg by mouth at bedtime as needed (Sleep).     [provider]  metFORMIN (GLUCOPHAGE) 500 MG tablet Take 1,000 mg by mouth 2 (two) times daily with a meal.     [provider]  morphine (MSIR) 15 MG tablet Take 15 mg by mouth 4 (four) times daily. 10/05/19   [provider]  omega-3 acid ethyl esters (LOVAZA) 1 G capsule Take 1 g by mouth 2 (two) times daily.     [provider]  ondansetron (ZOFRAN ODT) 4 MG disintegrating tablet Take 1 tablet (4 mg total) by mouth every 8 (eight) hours as needed for nausea or vomiting. 07/10/20   Little, Wenda Overland, MD  pantoprazole (PROTONIX) 40 MG tablet Take 40 mg by mouth daily.    [provider]  rOPINIRole (REQUIP) 1 MG tablet Take 1 mg by mouth at bedtime.  11/28/15   [provider]  sildenafil (VIAGRA) 100 MG tablet Take 100 mg by mouth daily as needed for erectile dysfunction.    [provider]  spironolactone (ALDACTONE) 25 MG tablet TAKE 1 TABLET DAILY IN THE MORNING 04/15/20   Larey Dresser, MD  testosterone cypionate (DEPOTESTOSTERONE CYPIONATE) 200 MG/ML injection Inject 200 mg into the muscle See admin instructions. into the muscle every 10 days    [provider]   tiZANidine (ZANAFLEX) 4 MG tablet Take 4 mg by mouth every 6 (six) hours as needed for muscle spasms.    [provider]    Allergies    Statins and Cortisone  Review of  Systems   Review of Systems  Constitutional: Negative for fever.  HENT: Negative for sore throat.   Eyes: Negative for visual disturbance.  Respiratory: Negative for shortness of breath.   Cardiovascular: Positive for leg swelling. Negative for chest pain.  Gastrointestinal: Negative for abdominal pain.  Genitourinary: Negative for dysuria.  Musculoskeletal: Negative for back pain.  Skin: Positive for wound. Negative for rash.  Neurological: Negative for headaches.    Physical Exam Updated Vital Signs BP 103/85 (BP Location: Right Arm)   Pulse 74   Temp 98.6 F (37 C) (Oral)   Resp 18   Ht 6' (1.829 m)   Wt 107 kg   SpO2 96%   BMI 32.01 kg/m   Physical Exam Vitals and nursing note reviewed.  Constitutional:      Appearance: Normal appearance. He is well-developed.  HENT:     Head: Normocephalic and atraumatic.  Eyes:     Conjunctiva/sclera: Conjunctivae normal.  Cardiovascular:     Rate and Rhythm: Normal rate and regular rhythm.     Heart sounds: No murmur heard.   Pulmonary:     Effort: Pulmonary effort is normal. No respiratory distress.     Breath sounds: Normal breath sounds.  Abdominal:     Palpations: Abdomen is soft.     Tenderness: There is no abdominal tenderness.  Musculoskeletal:        General: Swelling, tenderness and signs of injury present.     Cervical back: Neck supple.     Comments: Left lower extremity hip and knee nontender.  He is diffusely tender lower leg including ankle moderate swelling.  Ecchymosis ankle and foot down to toes.  Toes nontender.  Midfoot diffusely tender.  DP pulse 2+.  Sensorimotor intact.  Other extremities full range of motion without any pain or limitations.  Skin:    General: Skin is warm and dry.  Neurological:     General: No focal  deficit present.     Mental Status: He is alert and oriented to person, place, and time.     Sensory: No sensory deficit.     Motor: No weakness.     ED Results / Procedures / Treatments   Labs (all labs ordered are listed, but only abnormal results are displayed) Labs Reviewed - No data to display  EKG None  Radiology DG Tibia/Fibula Left  Result Date: 08/01/2020 CLINICAL DATA:  75 year old male with left ankle and lower leg pain for 4 days. EXAM: LEFT ANKLE COMPLETE - 3+ VIEW; LEFT FOOT - COMPLETE 3+ VIEW; LEFT TIBIA AND FIBULA - 2 VIEW COMPARISON:  None. FINDINGS: Left tibia/fibula: There is no evidence of fracture, dislocation, or joint effusion. Moderate tricompartmental degenerative changes of the left knee. Soft tissues are unremarkable. Left ankle: There is no evidence of fracture, dislocation, or joint effusion. No significant arthropathy or other focal osseous abnormalities. Mild diffuse soft tissue prominence. Left foot: There is no evidence of fracture, dislocation, joint effusion. No significant arthropathy. Plantar calcaneal enthesopathy. No radiopaque foreign bodies. IMPRESSION: 1. No acute fracture or malalignment. 2. Mild soft tissue prominence about the ankle. 3. Moderate tricompartmental degenerative changes of the left knee. Electronically Signed   By: Ruthann Cancer MD   On: 08/01/2020 10:20   DG Ankle Complete Left  Result Date: 08/01/2020 CLINICAL DATA:  75 year old male with left ankle and lower leg pain for 4 days. EXAM: LEFT ANKLE COMPLETE - 3+ VIEW; LEFT FOOT - COMPLETE 3+ VIEW; LEFT TIBIA AND FIBULA - 2  VIEW COMPARISON:  None. FINDINGS: Left tibia/fibula: There is no evidence of fracture, dislocation, or joint effusion. Moderate tricompartmental degenerative changes of the left knee. Soft tissues are unremarkable. Left ankle: There is no evidence of fracture, dislocation, or joint effusion. No significant arthropathy or other focal osseous abnormalities. Mild diffuse  soft tissue prominence. Left foot: There is no evidence of fracture, dislocation, joint effusion. No significant arthropathy. Plantar calcaneal enthesopathy. No radiopaque foreign bodies. IMPRESSION: 1. No acute fracture or malalignment. 2. Mild soft tissue prominence about the ankle. 3. Moderate tricompartmental degenerative changes of the left knee. Electronically Signed   By: Ruthann Cancer MD   On: 08/01/2020 10:20   DG Foot Complete Left  Result Date: 08/01/2020 CLINICAL DATA:  75 year old male with left ankle and lower leg pain for 4 days. EXAM: LEFT ANKLE COMPLETE - 3+ VIEW; LEFT FOOT - COMPLETE 3+ VIEW; LEFT TIBIA AND FIBULA - 2 VIEW COMPARISON:  None. FINDINGS: Left tibia/fibula: There is no evidence of fracture, dislocation, or joint effusion. Moderate tricompartmental degenerative changes of the left knee. Soft tissues are unremarkable. Left ankle: There is no evidence of fracture, dislocation, or joint effusion. No significant arthropathy or other focal osseous abnormalities. Mild diffuse soft tissue prominence. Left foot: There is no evidence of fracture, dislocation, joint effusion. No significant arthropathy. Plantar calcaneal enthesopathy. No radiopaque foreign bodies. IMPRESSION: 1. No acute fracture or malalignment. 2. Mild soft tissue prominence about the ankle. 3. Moderate tricompartmental degenerative changes of the left knee. Electronically Signed   By: Ruthann Cancer MD   On: 08/01/2020 10:20    Procedures Procedures   Medications Ordered in ED Medications - No data to display  ED Course  I have reviewed the triage vital signs and the nursing notes.  Pertinent labs & imaging results that were available during my care of the patient were reviewed by me and considered in my medical decision making (see chart for details).  Clinical Course as of 08/01/20 1848  Mon Aug 01, 2020  1014 Left lower extremity x-ray of ankle does not show any acute fracture or dislocation.Foot x-ray  without any acute findings either. [MB]    Clinical Course User Index [MB] Hayden Rasmussen, MD   MDM Rules/Calculators/A&P                         CHA2DS2/VAS Stroke Risk Points  Current as of 10 minutes ago     6 >= 2 Points: High Risk  1 - 1.99 Points: Medium Risk  0 Points: Low Risk    Last Change: N/A      Details    This score determines the patient's risk of having a stroke if the  patient has atrial fibrillation.       Points Metrics  1 Has Congestive Heart Failure:  Yes    Current as of 10 minutes ago  1 Has Vascular Disease:  Yes    Current as of 10 minutes ago  1 Has Hypertension:  Yes    Current as of 10 minutes ago  2 Age:  57    Current as of 10 minutes ago  1 Has Diabetes:  Yes    Current as of 10 minutes ago  0 Had Stroke:  No  Had TIA:  No  Had Thromboembolism:  No    Current as of 10 minutes ago  0 Male:  No    Current as of 10 minutes ago  Final Clinical Impression(s) / ED Diagnoses Final diagnoses:  Sprain of right ankle, unspecified ligament, initial encounter    Rx / DC Orders ED Discharge Orders    None       Hayden Rasmussen, MD 08/01/20 1849

## 2020-08-01 NOTE — ED Notes (Signed)
UPON ASSESSMENT WHEN BARELY TOUCHING HIS LEFT FOOT/TOES, PT GRIMACED AND PULLED LEG BACK.  PT STATES LEG IS VERY TENDER TO [facility]

## 2020-08-01 NOTE — Discharge Instructions (Addendum)
You were seen in the emergency department for evaluation of left ankle pain and swelling.  You had x-rays that did not show any obvious fracture or dislocation.  This is likely an ankle sprain and will need to heal on its own.  Please contact your orthopedic surgeon for close follow-up.  Boot for added support and stability.  Elevation.  Ice.

## 2020-08-01 NOTE — ED Triage Notes (Signed)
C/o left lower leg pain onset last Wednesday skin tear noted thinks he may of hit leg on something  Pain is in left lower leg radiating into ankle and foot

## 2020-08-08 DIAGNOSIS — M25572 Pain in left ankle and joints of left foot: Secondary | ICD-10-CM | POA: Diagnosis not present

## 2020-08-17 ENCOUNTER — Ambulatory Visit (INDEPENDENT_AMBULATORY_CARE_PROVIDER_SITE_OTHER): Payer: Medicare Other

## 2020-08-17 DIAGNOSIS — I428 Other cardiomyopathies: Secondary | ICD-10-CM | POA: Diagnosis not present

## 2020-08-18 LAB — CUP PACEART REMOTE DEVICE CHECK
Battery Remaining Longevity: 17 mo
Battery Remaining Percentage: 20 %
Battery Voltage: 2.77 V
Brady Statistic AP VP Percent: 3.1 %
Brady Statistic AP VS Percent: 1 %
Brady Statistic AS VP Percent: 94 %
Brady Statistic AS VS Percent: 2 %
Brady Statistic RA Percent Paced: 3.3 %
Date Time Interrogation Session: 20220601035102
HighPow Impedance: 71 Ohm
HighPow Impedance: 71 Ohm
Implantable Lead Implant Date: 20151119
Implantable Lead Implant Date: 20151119
Implantable Lead Implant Date: 20151119
Implantable Lead Location: 753858
Implantable Lead Location: 753859
Implantable Lead Location: 753860
Implantable Pulse Generator Implant Date: 20151119
Lead Channel Impedance Value: 390 Ohm
Lead Channel Impedance Value: 440 Ohm
Lead Channel Impedance Value: 540 Ohm
Lead Channel Pacing Threshold Amplitude: 0.5 V
Lead Channel Pacing Threshold Amplitude: 0.75 V
Lead Channel Pacing Threshold Amplitude: 1 V
Lead Channel Pacing Threshold Pulse Width: 0.5 ms
Lead Channel Pacing Threshold Pulse Width: 0.5 ms
Lead Channel Pacing Threshold Pulse Width: 0.5 ms
Lead Channel Sensing Intrinsic Amplitude: 12 mV
Lead Channel Sensing Intrinsic Amplitude: 2.9 mV
Lead Channel Setting Pacing Amplitude: 1.5 V
Lead Channel Setting Pacing Amplitude: 2 V
Lead Channel Setting Pacing Amplitude: 2.5 V
Lead Channel Setting Pacing Pulse Width: 0.5 ms
Lead Channel Setting Pacing Pulse Width: 0.5 ms
Lead Channel Setting Sensing Sensitivity: 0.5 mV
Pulse Gen Serial Number: 7211486

## 2020-08-23 ENCOUNTER — Other Ambulatory Visit (HOSPITAL_COMMUNITY): Payer: Self-pay | Admitting: Cardiology

## 2020-08-24 DIAGNOSIS — M25559 Pain in unspecified hip: Secondary | ICD-10-CM | POA: Diagnosis not present

## 2020-08-24 DIAGNOSIS — M1710 Unilateral primary osteoarthritis, unspecified knee: Secondary | ICD-10-CM | POA: Diagnosis not present

## 2020-08-24 DIAGNOSIS — M47817 Spondylosis without myelopathy or radiculopathy, lumbosacral region: Secondary | ICD-10-CM | POA: Diagnosis not present

## 2020-08-24 DIAGNOSIS — G894 Chronic pain syndrome: Secondary | ICD-10-CM | POA: Diagnosis not present

## 2020-08-26 DIAGNOSIS — M25572 Pain in left ankle and joints of left foot: Secondary | ICD-10-CM | POA: Diagnosis not present

## 2020-08-29 ENCOUNTER — Ambulatory Visit (INDEPENDENT_AMBULATORY_CARE_PROVIDER_SITE_OTHER): Payer: Medicare Other

## 2020-08-29 DIAGNOSIS — Z95810 Presence of automatic (implantable) cardiac defibrillator: Secondary | ICD-10-CM | POA: Diagnosis not present

## 2020-08-29 DIAGNOSIS — I5022 Chronic systolic (congestive) heart failure: Secondary | ICD-10-CM | POA: Diagnosis not present

## 2020-09-02 NOTE — Progress Notes (Signed)
EPIC Encounter for ICM Monitoring  Patient Name: Walter [name] is a 75 y.o. male Date: 09/02/2020 Primary Care Physican: London Pepper, MD Primary Cardiologist: Aundra Dubin Electrophysiologist: Allred Bi-V Pacing: 97%         09/02/2020 Weight: 244 lbs (baseline 240-241 lb                                           Spoke with patient and reports feeling well at this time. Heart failure questions reviewed. Pt symptomatic with weight gain and slight swelling in feet.   CorVue thoracic impedance suggesting possible possible fluid accumulation    Furosemide 20 mg Take 1 tablet (20 mg total) by mouth every other day.    Labs:  04/15/2020 Creatinine 1.12, BUN 11, Potassium 4.0, Sodium 138 A complete set of results can be found in Results Review.   Recommendations:   Advised to take extra Furosemide x 1 day and return to prescribed dosage.    Follow-up plan: ICM clinic phone appointment on 10/10/2020.  91 day device clinic remote transmission 11/16/2020.     EP/Cardiology Office Visits:  10/24/2020 with Dr. Rayann Heman.     Copy of ICM check sent to Dr. Rayann Heman.  3 month ICM trend: 09/01/2020.    1 Year ICM trend:       Rosalene Billings, RN 09/02/2020 1:44 PM

## 2020-09-09 NOTE — Progress Notes (Signed)
Remote ICD transmission.   

## 2020-09-16 DIAGNOSIS — Z20822 Contact with and (suspected) exposure to covid-19: Secondary | ICD-10-CM | POA: Diagnosis not present

## 2020-09-22 DIAGNOSIS — M47817 Spondylosis without myelopathy or radiculopathy, lumbosacral region: Secondary | ICD-10-CM | POA: Diagnosis not present

## 2020-09-22 DIAGNOSIS — M25559 Pain in unspecified hip: Secondary | ICD-10-CM | POA: Diagnosis not present

## 2020-09-22 DIAGNOSIS — M25519 Pain in unspecified shoulder: Secondary | ICD-10-CM | POA: Diagnosis not present

## 2020-09-22 DIAGNOSIS — Z79891 Long term (current) use of opiate analgesic: Secondary | ICD-10-CM | POA: Diagnosis not present

## 2020-09-22 DIAGNOSIS — G894 Chronic pain syndrome: Secondary | ICD-10-CM | POA: Diagnosis not present

## 2020-09-22 DIAGNOSIS — Z79899 Other long term (current) drug therapy: Secondary | ICD-10-CM | POA: Diagnosis not present

## 2020-09-29 DIAGNOSIS — I4891 Unspecified atrial fibrillation: Secondary | ICD-10-CM | POA: Diagnosis not present

## 2020-09-29 DIAGNOSIS — G2581 Restless legs syndrome: Secondary | ICD-10-CM | POA: Diagnosis not present

## 2020-09-29 DIAGNOSIS — G8929 Other chronic pain: Secondary | ICD-10-CM | POA: Diagnosis not present

## 2020-09-29 DIAGNOSIS — E1169 Type 2 diabetes mellitus with other specified complication: Secondary | ICD-10-CM | POA: Diagnosis not present

## 2020-09-29 DIAGNOSIS — R251 Tremor, unspecified: Secondary | ICD-10-CM | POA: Diagnosis not present

## 2020-09-29 DIAGNOSIS — Z0000 Encounter for general adult medical examination without abnormal findings: Secondary | ICD-10-CM | POA: Diagnosis not present

## 2020-09-29 DIAGNOSIS — E785 Hyperlipidemia, unspecified: Secondary | ICD-10-CM | POA: Diagnosis not present

## 2020-09-29 DIAGNOSIS — I10 Essential (primary) hypertension: Secondary | ICD-10-CM | POA: Diagnosis not present

## 2020-09-29 DIAGNOSIS — N529 Male erectile dysfunction, unspecified: Secondary | ICD-10-CM | POA: Diagnosis not present

## 2020-09-29 DIAGNOSIS — E291 Testicular hypofunction: Secondary | ICD-10-CM | POA: Diagnosis not present

## 2020-09-29 DIAGNOSIS — Z125 Encounter for screening for malignant neoplasm of prostate: Secondary | ICD-10-CM | POA: Diagnosis not present

## 2020-10-10 ENCOUNTER — Ambulatory Visit (INDEPENDENT_AMBULATORY_CARE_PROVIDER_SITE_OTHER): Payer: Medicare Other

## 2020-10-10 DIAGNOSIS — Z95810 Presence of automatic (implantable) cardiac defibrillator: Secondary | ICD-10-CM

## 2020-10-10 DIAGNOSIS — I5022 Chronic systolic (congestive) heart failure: Secondary | ICD-10-CM | POA: Diagnosis not present

## 2020-10-11 NOTE — Progress Notes (Signed)
EPIC Encounter for ICM Monitoring  Patient Name: Walter [name] is a 75 y.o. male Date: 10/11/2020 Primary Care Physican: London Pepper, MD Primary Cardiologist: Aundra Dubin Electrophysiologist: Allred Bi-V Pacing: 96%         10/11/2020 Weight: 243-245 lbs (baseline 240-241 lb                                           Spoke with patient and reports feeling well at this time. Heart failure questions reviewed. Pt asymptomatic.   CorVue thoracic impedance suggesting normal fluid levels    Furosemide 20 mg Take 1 tablet (20 mg total) by mouth every other day.  He reports 10/11/2020, he takes Furosemide PRN instead of every other day.    Labs:  04/15/2020 Creatinine 1.12, BUN 11, Potassium 4.0, Sodium 138 A complete set of results can be found in Results Review.   Recommendations:   No changes and encouraged to call if experiencing any fluid symptoms.   Follow-up plan: ICM clinic phone appointment on 11/18/2020.  91 day device clinic remote transmission 11/16/2020.     EP/Cardiology Office Visits:  10/24/2020 with Dr. Rayann Heman.     Copy of ICM check sent to Dr. Rayann Heman.   3 month ICM trend: 10/10/2020.    1 Year ICM trend:       Rosalene Billings, RN 10/11/2020 3:23 PM

## 2020-10-20 DIAGNOSIS — M47817 Spondylosis without myelopathy or radiculopathy, lumbosacral region: Secondary | ICD-10-CM | POA: Diagnosis not present

## 2020-10-20 DIAGNOSIS — M25519 Pain in unspecified shoulder: Secondary | ICD-10-CM | POA: Diagnosis not present

## 2020-10-20 DIAGNOSIS — M25559 Pain in unspecified hip: Secondary | ICD-10-CM | POA: Diagnosis not present

## 2020-10-20 DIAGNOSIS — G894 Chronic pain syndrome: Secondary | ICD-10-CM | POA: Diagnosis not present

## 2020-10-24 ENCOUNTER — Other Ambulatory Visit: Payer: Self-pay

## 2020-10-24 ENCOUNTER — Ambulatory Visit (INDEPENDENT_AMBULATORY_CARE_PROVIDER_SITE_OTHER): Payer: Medicare Other | Admitting: Internal Medicine

## 2020-10-24 VITALS — BP 124/70 | HR 65 | Ht 72.0 in | Wt 243.0 lb

## 2020-10-24 DIAGNOSIS — I428 Other cardiomyopathies: Secondary | ICD-10-CM | POA: Diagnosis not present

## 2020-10-24 DIAGNOSIS — I10 Essential (primary) hypertension: Secondary | ICD-10-CM | POA: Diagnosis not present

## 2020-10-24 DIAGNOSIS — I5022 Chronic systolic (congestive) heart failure: Secondary | ICD-10-CM

## 2020-10-24 DIAGNOSIS — I480 Paroxysmal atrial fibrillation: Secondary | ICD-10-CM | POA: Diagnosis not present

## 2020-10-24 NOTE — Patient Instructions (Addendum)
Medication Instructions:  Your physician recommends that you continue on your current medications as directed. Please refer to the Current Medication list given to you today.  Labwork: None ordered.  Testing/Procedures: None ordered.  Follow-Up: Your physician wants you to follow-up in: 12 months with Thompson Grayer, MD or one of the following Advanced Practice Providers on your designated Care Team:    Legrand Como "Jonni Sanger" Chalmers Cater, [place]   You will receive a reminder letter in the mail two months in advance. If you don't receive a letter, please call our office to schedule the follow-up appointment.  Remote monitoring is used to monitor your ICD from home. This monitoring reduces the number of office visits required to check your device to one time per year. It allows [place] to keep an eye on the functioning of your device to ensure it is working properly. You are scheduled for a device check from home on 11/16/20. You may send your transmission at any time that day. If you have a wireless device, the transmission will be sent automatically. After your physician reviews your transmission, you will receive a postcard with your next transmission date.  Any Other Special Instructions Will Be Listed Below (If Applicable).  If you need a refill on your cardiac medications before your next appointment, please call your pharmacy.

## 2020-10-24 NOTE — Progress Notes (Signed)
PCP: London Pepper, MD Primary Cardiologist: Dr Aundra Dubin Primary EP: [name]  Walter [name] is a 75 y.o. male who presents today for routine electrophysiology followup.  Since last being seen in our clinic, the patient reports doing very well.  His tremor is much improved off of amiodarone.  No symptoms of afib.  Today, he denies symptoms of palpitations, chest pain, shortness of breath,  lower extremity edema, dizziness, presyncope, syncope, or ICD shocks.  The patient is otherwise without complaint today.   Past Medical History:  Diagnosis Date   AICD (automatic cardioverter/defibrillator) present    Anxiety    Asbestosis ([place])    mild   Atrial fibrillation (HCC)    Chronic low back pain    /sciatica- Dr Letta Median ([facility])   Complication of anesthesia 1996   previous surgery had a run of v-tach (for gallbladder-1990's)-cannot lie completely flat and panis when oxygen mask placed over face   Coronary artery disease, non-occlusive June 2011   Cardiac cath in [facility] following abnormal Myoview   Delirium 05/06/2019   DJD (degenerative joint disease)    /osteoarthritis   Dyslipidemia, goal LDL below 100    On lovastatin (myalgias with Zocor and Lipitor)   Dyspnea    worse in hot weather   Dysrhythmia    LBBB, in 2015-atrial fibrillation   Essential hypertension    Factor V Leiden [facility])    sees Dr. Cherrie Gauze Marrow- no problems with this   Frozen shoulder    right   GERD (gastroesophageal reflux disease)    H/O asbestosis    when in [employer]-1964-1970,1971-1986 in [employer]-diagnosed in 1990   History of kidney stones    Hypogonadism male    Irritable bowel    Kidney stone    Left bundle branch block (LBBB) on electrocardiogram    Diagnosed close to 20 years ago   Myocardial infarction [facility])    Non-ischemic cardiomyopathy ([place]) June 2011   EF 45-50%; suspect related to LBBB; repeat Echo March 2015: EF 50- 55%   Obesity    PONV (postoperative nausea  and vomiting)    Restless leg syndrome    Rotator cuff arthropathy of right shoulder 05/14/2017   Sleep apnea    Testicular cancer ([place]) 1972   left   Tremor, essential 12/09/2015   Type 2 diabetes mellitus without complication ([place]) [id number]   Type 2.    Wears glasses    Past Surgical History:  Procedure Laterality Date   APPENDECTOMY N/A    BI-VENTRICULAR IMPLANTABLE CARDIOVERTER DEFIBRILLATOR N/A 02/04/2014   SJM Quadra Assura BiV ICD implanted for primary prevention by [name]   CARDIAC CATHETERIZATION  June 2011   Nonobstructive CAD   CARDIAC DEFIBRILLATOR PLACEMENT  02/04/14   [employer] model [place] 3365-40Q (serial  Number [id number]) biventricular ICD.   CARDIOVERSION N/A 10/13/2013   Procedure: CARDIOVERSION;  Surgeon: Larey Dresser, MD;  Location: [place];  Service: Cardiovascular;  Laterality: N/A;   CARDIOVERSION  10-16-2013   DCCV by [name] following intracardiac echo to rule out LAA thrombus   CARDIOVERSION N/A 10/16/2013   Procedure: CARDIOVERSION;  Surgeon: Coralyn Mark, MD;  Location: [facility] C[facility] LAB;  Service: Cardiovascular;  Laterality: N/A;   CHOLECYSTECTOMY OPEN     COLONOSCOPY WITH PROPOFOL N/A 12/04/2019   Procedure: COLONOSCOPY WITH PROPOFOL;  Surgeon: Wonda Horner, MD;  Location: [facility] ENDOSCOPY;  Service: Endoscopy;  Laterality: N/A;   ELECTROPHYSIOLOGY STUDY N/A  10/16/2013   Procedure: ELECTROPHYSIOLOGY STUDY;  Surgeon: Coralyn Mark, MD;  Location: [facility] CATH LAB;  Service: Cardiovascular;  Laterality: N/A;   ESOPHAGOGASTRODUODENOSCOPY N/A 10/15/2013   Procedure: ESOPHAGOGASTRODUODENOSCOPY (EGD);  Surgeon: Gatha Mayer, MD;  Location: [facility] ENDOSCOPY;  Service: Endoscopy;  Laterality: N/A;  bedside   HERNIA REPAIR     KNEE ARTHROSCOPY Left    LEFT AND RIGHT HEART CATHETERIZATION WITH CORONARY ANGIOGRAM N/A 10/12/2013   Procedure: LEFT AND RIGHT HEART CATHETERIZATION WITH CORONARY ANGIOGRAM;  Surgeon: Troy Sine, MD;  Location: [facility] CATH LAB;   Service: Cardiovascular;  Laterality: N/A;   lymph nodes     RADIOFREQUENCY ABLATION NERVES     Back every 6 months   REVERSE SHOULDER ARTHROPLASTY Right 05/14/2017   Procedure: TOTAL REVERSE SHOULDER ARTHROPLASTY;  Surgeon: Marchia Bond, MD;  Location: [place];  Service: Orthopedics;  Laterality: Right;   ROTATOR CUFF REPAIR Right    SURGERY SCROTAL / TESTICULAR Left    TONSILLECTOMY Bilateral    TOTAL HIP ARTHROPLASTY Right 02/22/2015   Procedure: RIGHT TOTAL HIP ARTHROPLASTY ANTERIOR APPROACH;  Surgeon: Renette Butters, MD;  Location: [place];  Service: Orthopedics;  Laterality: Right;   TOTAL HIP ARTHROPLASTY Left 05/26/2019   Procedure: TOTAL HIP ARTHROPLASTY;  Surgeon: Marchia Bond, MD;  Location: [facility];  Service: Orthopedics;  Laterality: Left;   TRANSTHORACIC ECHOCARDIOGRAM  May 2011   EF 45% with diffuse hypokinesis; septal bounce from LBBB   TRANSTHORACIC ECHOCARDIOGRAM  March 2015   EF 50-55%. Mild concentric LVH. Septal dyssynergy from LBBB     ROS- all systems are reviewed and negative except as per HPI above  Current Outpatient Medications  Medication Sig Dispense Refill   albuterol (PROVENTIL HFA;VENTOLIN HFA) 108 (90 Base) MCG/ACT inhaler Inhale 1-2 puffs into the lungs every 6 (six) hours as needed for wheezing or shortness of breath.     ALTOPREV 40 MG 24 hr tablet Take 40 mg by mouth in the morning and at bedtime.     carvedilol (COREG) 12.5 MG tablet TAKE 1 TABLET TWICE A DAY 180 tablet 3   cholecalciferol (VITAMIN D) 25 MCG (1000 UNIT) tablet Take 1,000 Units by mouth daily.     Cyanocobalamin (VITAMIN B-12) 2500 MCG SUBL Place 2,500 mcg under the tongue daily.     ELIQUIS 5 MG TABS tablet TAKE 1 TABLET TWICE A DAY 180 tablet 3   empagliflozin (JARDIANCE) 10 MG TABS tablet Take 10 mg by mouth daily.     furosemide (LASIX) 20 MG tablet Take 1 tablet (20 mg total) by mouth every other day. 90 tablet 3   gabapentin (NEURONTIN) 300 MG capsule Take 300-600 mg by mouth  daily as needed (pain).      lisinopril (ZESTRIL) 10 MG tablet TAKE 1 TABLET EVERY EVENING 90 tablet 3   melatonin 5 MG TABS Take 10-15 mg by mouth at bedtime as needed (Sleep).      metFORMIN (GLUCOPHAGE) 500 MG tablet Take 1,000 mg by mouth 2 (two) times daily with a meal.      morphine (MSIR) 15 MG tablet Take 15 mg by mouth 4 (four) times daily.     omega-3 acid ethyl esters (LOVAZA) 1 G capsule Take 1 g by mouth 2 (two) times daily.      ondansetron (ZOFRAN ODT) 4 MG disintegrating tablet Take 1 tablet (4 mg total) by mouth every 8 (eight) hours as needed for nausea or vomiting. 6 tablet 0   pantoprazole (PROTONIX) 40  MG tablet Take 40 mg by mouth daily.     rOPINIRole (REQUIP) 1 MG tablet Take 1 mg by mouth at bedtime.      sildenafil (VIAGRA) 100 MG tablet Take 100 mg by mouth daily as needed for erectile dysfunction.     spironolactone (ALDACTONE) 25 MG tablet TAKE 1 TABLET DAILY IN THE MORNING 90 tablet 3   tadalafil (CIALIS) 10 MG tablet Take 1 tablet by mouth as needed for erectile dysfunction.     testosterone cypionate (DEPOTESTOSTERONE CYPIONATE) 200 MG/ML injection Inject 200 mg into the muscle See admin instructions. into the muscle every 10 days     tiZANidine (ZANAFLEX) 4 MG tablet Take 4 mg by mouth every 6 (six) hours as needed for muscle spasms.     No current facility-administered medications for this visit.    Physical Exam: Vitals:   10/24/20 1431  BP: 124/70  Pulse: 65  SpO2: 93%  Weight: 243 lb (110.2 kg)  Height: 6' (1.829 m)    GEN- The patient is well appearing, alert and oriented x 3 today.   Head- normocephalic, atraumatic Eyes-  Sclera clear, conjunctiva pink Ears- hearing intact Oropharynx- clear Lungs- Clear to ausculation bilaterally, normal work of breathing Chest- ICD pocket is well healed Heart- Regular rate and rhythm, no murmurs, rubs or gallops, PMI not laterally displaced GI- soft, NT, ND, + BS Extremities- no clubbing, cyanosis, or  edema  ICD interrogation- reviewed in detail today,  See PACEART report  ekg tracing ordered today is personally reviewed and shows sinus with BiV pacing  Wt Readings from Last 3 Encounters:  10/24/20 243 lb (110.2 kg)  08/01/20 236 lb (107 kg)  07/10/20 239 lb (108.4 kg)    Assessment and Plan:  1.  Chronic systolic dysfunction/  nonischemic CM euvolemic today EF has recovered with CRT Stable on an appropriate medical regimen Normal ICD function See [facility] report No changes today he is not device dependant today followed in ICM device clinic  2. Paroxysmal atrial fibrillation Well controlled (AF burden < 1% by ICD) Amiodarone has been previously discontinued due to tremor Chads2vasc score is 4.  He is on eliquis. If AF burden increases, we will consider tikosyn vs ablation.  3. Htn Continue spironolactone '25mg'$  daily Labs 07/10/20 reviewed Creatinine was elevated then Labs from [place] are reviewed and reveal creatinine 1.38 on 09/29/20    Return to see EP APP in a year  Thompson Grayer MD, [facility] 10/24/2020 2:32 PM

## 2020-11-16 ENCOUNTER — Ambulatory Visit (INDEPENDENT_AMBULATORY_CARE_PROVIDER_SITE_OTHER): Payer: Medicare Other

## 2020-11-16 DIAGNOSIS — I428 Other cardiomyopathies: Secondary | ICD-10-CM | POA: Diagnosis not present

## 2020-11-16 DIAGNOSIS — M25559 Pain in unspecified hip: Secondary | ICD-10-CM | POA: Diagnosis not present

## 2020-11-16 DIAGNOSIS — M47817 Spondylosis without myelopathy or radiculopathy, lumbosacral region: Secondary | ICD-10-CM | POA: Diagnosis not present

## 2020-11-16 DIAGNOSIS — G894 Chronic pain syndrome: Secondary | ICD-10-CM | POA: Diagnosis not present

## 2020-11-16 DIAGNOSIS — M25519 Pain in unspecified shoulder: Secondary | ICD-10-CM | POA: Diagnosis not present

## 2020-11-18 ENCOUNTER — Telehealth: Payer: Self-pay

## 2020-11-18 ENCOUNTER — Ambulatory Visit (INDEPENDENT_AMBULATORY_CARE_PROVIDER_SITE_OTHER): Payer: Medicare Other

## 2020-11-18 DIAGNOSIS — Z95810 Presence of automatic (implantable) cardiac defibrillator: Secondary | ICD-10-CM

## 2020-11-18 DIAGNOSIS — I5022 Chronic systolic (congestive) heart failure: Secondary | ICD-10-CM | POA: Diagnosis not present

## 2020-11-18 NOTE — Progress Notes (Signed)
EPIC Encounter for ICM Monitoring  Patient Name: Walter [name] is a 75 y.o. male Date: 11/18/2020 Primary Care Physican: London Pepper, MD Primary Cardiologist: Aundra Dubin Electrophysiologist: Allred Bi-V Pacing: 95%         10/11/2020 Weight: 243-245 lbs (baseline 240-241 lb                                           Attempted call to patient and unable to reach.  Left detailed message per DPR regarding transmission. Transmission reviewed.    CorVue thoracic impedance suggesting normal fluid levels    Furosemide 20 mg Take 1 tablet (20 mg total) by mouth every other day.  He reports 10/11/2020, he takes Furosemide PRN instead of every other day.    Labs:  04/15/2020 Creatinine 1.12, BUN 11, Potassium 4.0, Sodium 138 A complete set of results can be found in Results Review.   Recommendations:  Left voice mail with ICM number and encouraged to call if experiencing any fluid symptoms.   Follow-up plan: ICM clinic phone appointment on 12/26/2020.  91 day device clinic remote transmission 02/15/2021.     EP/Cardiology Office Visits: Recall 10/30/2021 with Dr. Rayann Heman.  Recall 10/12/2020 with Dr Aundra Dubin   Copy of ICM check sent to Dr. Rayann Heman.    3 month ICM trend: 11/16/2020.    1 Year ICM trend:       Rosalene Billings, RN 11/18/2020 4:14 PM

## 2020-11-18 NOTE — Telephone Encounter (Signed)
Remote ICM transmission received.  Attempted call to patient regarding ICM remote transmission and left detailed message per DPR.  Advised to return call for any fluid symptoms or questions. Next ICM remote transmission scheduled 12/26/2020.    

## 2020-11-22 ENCOUNTER — Other Ambulatory Visit: Payer: Self-pay | Admitting: Family Medicine

## 2020-11-22 ENCOUNTER — Ambulatory Visit
Admission: RE | Admit: 2020-11-22 | Discharge: 2020-11-22 | Disposition: A | Discharge status: Home / Self Care | Payer: Medicare Other | Source: Ambulatory Visit | Attending: Family Medicine | Admitting: Family Medicine

## 2020-11-22 DIAGNOSIS — J929 Pleural plaque without asbestos: Secondary | ICD-10-CM | POA: Diagnosis not present

## 2020-11-22 DIAGNOSIS — Z77090 Contact with and (suspected) exposure to asbestos: Secondary | ICD-10-CM | POA: Diagnosis not present

## 2020-11-22 DIAGNOSIS — I700 Atherosclerosis of aorta: Secondary | ICD-10-CM | POA: Diagnosis not present

## 2020-11-22 DIAGNOSIS — Z950 Presence of cardiac pacemaker: Secondary | ICD-10-CM | POA: Diagnosis not present

## 2020-11-22 DIAGNOSIS — R059 Cough, unspecified: Secondary | ICD-10-CM

## 2020-11-22 LAB — CUP PACEART REMOTE DEVICE CHECK
Battery Remaining Longevity: 12 mo
Battery Remaining Longevity: 12 mo
Battery Remaining Percentage: 14 %
Battery Remaining Percentage: 14 %
Battery Voltage: 2.72 V
Battery Voltage: 2.72 V
Brady Statistic AP VP Percent: 5.5 %
Brady Statistic AP VP Percent: 5.5 %
Brady Statistic AP VS Percent: 1 %
Brady Statistic AP VS Percent: 1 %
Brady Statistic AS VP Percent: 89 %
Brady Statistic AS VP Percent: 89 %
Brady Statistic AS VS Percent: 4.2 %
Brady Statistic AS VS Percent: 4.2 %
Brady Statistic RA Percent Paced: 6 %
Brady Statistic RA Percent Paced: 6 %
Date Time Interrogation Session: 20220903022054
Date Time Interrogation Session: 20220903022054
HighPow Impedance: 75 Ohm
HighPow Impedance: 75 Ohm
HighPow Impedance: 75 Ohm
HighPow Impedance: 75 Ohm
Implantable Lead Implant Date: 20151119
Implantable Lead Implant Date: 20151119
Implantable Lead Implant Date: 20151119
Implantable Lead Implant Date: 20151119
Implantable Lead Implant Date: 20151119
Implantable Lead Implant Date: 20151119
Implantable Lead Location: 753858
Implantable Lead Location: 753859
Implantable Lead Location: 753860
Implantable Lead Location: 753858
Implantable Lead Location: 753859
Implantable Lead Location: 753860
Implantable Pulse Generator Implant Date: 20151119
Implantable Pulse Generator Implant Date: 20151119
Lead Channel Impedance Value: 400 Ohm
Lead Channel Impedance Value: 440 Ohm
Lead Channel Impedance Value: 540 Ohm
Lead Channel Impedance Value: 400 Ohm
Lead Channel Impedance Value: 440 Ohm
Lead Channel Impedance Value: 540 Ohm
Lead Channel Pacing Threshold Amplitude: 0.5 V
Lead Channel Pacing Threshold Amplitude: 0.75 V
Lead Channel Pacing Threshold Amplitude: 1.25 V
Lead Channel Pacing Threshold Amplitude: 0.5 V
Lead Channel Pacing Threshold Amplitude: 0.75 V
Lead Channel Pacing Threshold Amplitude: 1.25 V
Lead Channel Pacing Threshold Pulse Width: 0.5 ms
Lead Channel Pacing Threshold Pulse Width: 0.5 ms
Lead Channel Pacing Threshold Pulse Width: 0.5 ms
Lead Channel Pacing Threshold Pulse Width: 0.5 ms
Lead Channel Pacing Threshold Pulse Width: 0.5 ms
Lead Channel Pacing Threshold Pulse Width: 0.5 ms
Lead Channel Sensing Intrinsic Amplitude: 12 mV
Lead Channel Sensing Intrinsic Amplitude: 2.8 mV
Lead Channel Sensing Intrinsic Amplitude: 12 mV
Lead Channel Sensing Intrinsic Amplitude: 2.8 mV
Lead Channel Setting Pacing Amplitude: 1.5 V
Lead Channel Setting Pacing Amplitude: 2 V
Lead Channel Setting Pacing Amplitude: 2.5 V
Lead Channel Setting Pacing Amplitude: 1.5 V
Lead Channel Setting Pacing Amplitude: 2 V
Lead Channel Setting Pacing Amplitude: 2.5 V
Lead Channel Setting Pacing Pulse Width: 0.5 ms
Lead Channel Setting Pacing Pulse Width: 0.5 ms
Lead Channel Setting Pacing Pulse Width: 0.5 ms
Lead Channel Setting Pacing Pulse Width: 0.5 ms
Lead Channel Setting Sensing Sensitivity: 0.5 mV
Lead Channel Setting Sensing Sensitivity: 0.5 mV
Pulse Gen Serial Number: 7211486
Pulse Gen Serial Number: 7211486

## 2020-11-29 ENCOUNTER — Other Ambulatory Visit: Payer: Self-pay | Admitting: Family Medicine

## 2020-11-29 DIAGNOSIS — Z77090 Contact with and (suspected) exposure to asbestos: Secondary | ICD-10-CM

## 2020-11-29 NOTE — Progress Notes (Signed)
Remote ICD transmission.   

## 2020-12-08 DIAGNOSIS — H40051 Ocular hypertension, right eye: Secondary | ICD-10-CM | POA: Diagnosis not present

## 2020-12-08 DIAGNOSIS — E119 Type 2 diabetes mellitus without complications: Secondary | ICD-10-CM | POA: Diagnosis not present

## 2020-12-08 DIAGNOSIS — H2513 Age-related nuclear cataract, bilateral: Secondary | ICD-10-CM | POA: Diagnosis not present

## 2020-12-13 DIAGNOSIS — M47817 Spondylosis without myelopathy or radiculopathy, lumbosacral region: Secondary | ICD-10-CM | POA: Diagnosis not present

## 2020-12-13 DIAGNOSIS — G894 Chronic pain syndrome: Secondary | ICD-10-CM | POA: Diagnosis not present

## 2020-12-13 DIAGNOSIS — M533 Sacrococcygeal disorders, not elsewhere classified: Secondary | ICD-10-CM | POA: Diagnosis not present

## 2020-12-13 DIAGNOSIS — M25519 Pain in unspecified shoulder: Secondary | ICD-10-CM | POA: Diagnosis not present

## 2020-12-15 ENCOUNTER — Other Ambulatory Visit: Payer: Self-pay | Admitting: Physician Assistant

## 2020-12-15 DIAGNOSIS — M545 Low back pain, unspecified: Secondary | ICD-10-CM

## 2020-12-15 DIAGNOSIS — G8929 Other chronic pain: Secondary | ICD-10-CM

## 2020-12-20 ENCOUNTER — Other Ambulatory Visit: Payer: Medicare Other

## 2020-12-20 DIAGNOSIS — M47817 Spondylosis without myelopathy or radiculopathy, lumbosacral region: Secondary | ICD-10-CM | POA: Diagnosis not present

## 2020-12-23 ENCOUNTER — Telehealth (HOSPITAL_COMMUNITY): Payer: Self-pay | Admitting: *Deleted

## 2020-12-23 NOTE — Telephone Encounter (Signed)
Pt left vm stating he has to have a tooth extracted and needs to know if it is ok to hold eliquis and if so how many days should he hod eliquis.   Routed to [place] for advice

## 2020-12-26 ENCOUNTER — Ambulatory Visit (INDEPENDENT_AMBULATORY_CARE_PROVIDER_SITE_OTHER): Payer: Medicare Other

## 2020-12-26 DIAGNOSIS — I5022 Chronic systolic (congestive) heart failure: Secondary | ICD-10-CM

## 2020-12-26 DIAGNOSIS — Z95810 Presence of automatic (implantable) cardiac defibrillator: Secondary | ICD-10-CM | POA: Diagnosis not present

## 2020-12-27 ENCOUNTER — Ambulatory Visit
Admission: RE | Admit: 2020-12-27 | Discharge: 2020-12-27 | Disposition: A | Discharge status: Home / Self Care | Payer: Medicare Other | Source: Ambulatory Visit | Attending: Family Medicine | Admitting: Family Medicine

## 2020-12-27 ENCOUNTER — Other Ambulatory Visit: Payer: Self-pay

## 2020-12-27 ENCOUNTER — Ambulatory Visit
Admission: RE | Admit: 2020-12-27 | Discharge: 2020-12-27 | Disposition: A | Discharge status: Home / Self Care | Payer: Medicare Other | Source: Ambulatory Visit | Attending: Physician Assistant | Admitting: Physician Assistant

## 2020-12-27 DIAGNOSIS — I700 Atherosclerosis of aorta: Secondary | ICD-10-CM | POA: Diagnosis not present

## 2020-12-27 DIAGNOSIS — G8929 Other chronic pain: Secondary | ICD-10-CM

## 2020-12-27 DIAGNOSIS — M545 Low back pain, unspecified: Secondary | ICD-10-CM

## 2020-12-27 DIAGNOSIS — I2510 Atherosclerotic heart disease of native coronary artery without angina pectoris: Secondary | ICD-10-CM | POA: Diagnosis not present

## 2020-12-27 DIAGNOSIS — J929 Pleural plaque without asbestos: Secondary | ICD-10-CM | POA: Diagnosis not present

## 2020-12-27 DIAGNOSIS — D3501 Benign neoplasm of right adrenal gland: Secondary | ICD-10-CM | POA: Diagnosis not present

## 2020-12-27 DIAGNOSIS — Z77090 Contact with and (suspected) exposure to asbestos: Secondary | ICD-10-CM

## 2020-12-27 MED ORDER — IOPAMIDOL (ISOVUE-300) INJECTION 61%
75.0000 mL | Freq: Once | INTRAVENOUS | Status: AC | PRN
  Administered 2020-12-27: 75 mL via INTRAVENOUS

## 2020-12-27 NOTE — Telephone Encounter (Signed)
Left detailed vm °

## 2020-12-28 ENCOUNTER — Telehealth: Payer: Self-pay

## 2020-12-28 NOTE — Progress Notes (Signed)
EPIC Encounter for ICM Monitoring  Patient Name: Walter [name] is a 75 y.o. male Date: 12/28/2020 Primary Care Physican: London Pepper, MD Primary Cardiologist: Aundra Dubin Electrophysiologist: Allred Bi-V Pacing: 96%         10/11/2020 Weight: 243-245 lbs (baseline 240-241 lb                                           Attempted call to patient and unable to reach.  Left detailed message per DPR regarding transmission. Transmission reviewed.    CorVue thoracic impedance suggesting normal fluid levels    Furosemide 20 mg Take 1 tablet (20 mg total) by mouth every other day.  He reports 10/11/2020, he takes Furosemide PRN instead of every other day.    Labs:  07/10/2020 Creatinine 1.55, BUN 35, Potassium 3.5, Sodium 127, GFR 46 04/15/2020 Creatinine 1.12, BUN 11, Potassium 4.0, Sodium 138 A complete set of results can be found in Results Review.   Recommendations:  Left voice mail with ICM number and encouraged to call if experiencing any fluid symptoms.   Follow-up plan: ICM clinic phone appointment on 01/30/2021.  91 day device clinic remote transmission 02/15/2021.     EP/Cardiology Office Visits: Recall 10/30/2021 with Dr. Rayann Heman.  Recall 10/12/2020 with Dr Aundra Dubin   Copy of ICM check sent to Dr. Rayann Heman.     3 month ICM trend: 12/27/2020.    1 Year ICM trend:       Rosalene Billings, RN 12/28/2020 3:57 PM

## 2020-12-28 NOTE — Telephone Encounter (Signed)
Remote ICM transmission received.  Attempted call to patient regarding ICM remote transmission and left detailed message per DPR.  Advised to return call for any fluid symptoms or questions. Next ICM remote transmission scheduled 01/30/2021.

## 2021-01-18 DIAGNOSIS — M47817 Spondylosis without myelopathy or radiculopathy, lumbosacral region: Secondary | ICD-10-CM | POA: Diagnosis not present

## 2021-01-18 DIAGNOSIS — Z79899 Other long term (current) drug therapy: Secondary | ICD-10-CM | POA: Diagnosis not present

## 2021-01-18 DIAGNOSIS — M1710 Unilateral primary osteoarthritis, unspecified knee: Secondary | ICD-10-CM | POA: Diagnosis not present

## 2021-01-18 DIAGNOSIS — M533 Sacrococcygeal disorders, not elsewhere classified: Secondary | ICD-10-CM | POA: Diagnosis not present

## 2021-01-18 DIAGNOSIS — Z79891 Long term (current) use of opiate analgesic: Secondary | ICD-10-CM | POA: Diagnosis not present

## 2021-01-18 DIAGNOSIS — G894 Chronic pain syndrome: Secondary | ICD-10-CM | POA: Diagnosis not present

## 2021-01-30 ENCOUNTER — Ambulatory Visit (INDEPENDENT_AMBULATORY_CARE_PROVIDER_SITE_OTHER): Payer: Medicare Other

## 2021-01-30 DIAGNOSIS — Z95810 Presence of automatic (implantable) cardiac defibrillator: Secondary | ICD-10-CM | POA: Diagnosis not present

## 2021-01-30 DIAGNOSIS — I5022 Chronic systolic (congestive) heart failure: Secondary | ICD-10-CM | POA: Diagnosis not present

## 2021-02-01 DIAGNOSIS — Z23 Encounter for immunization: Secondary | ICD-10-CM | POA: Diagnosis not present

## 2021-02-01 DIAGNOSIS — N529 Male erectile dysfunction, unspecified: Secondary | ICD-10-CM | POA: Diagnosis not present

## 2021-02-01 DIAGNOSIS — I10 Essential (primary) hypertension: Secondary | ICD-10-CM | POA: Diagnosis not present

## 2021-02-01 DIAGNOSIS — E538 Deficiency of other specified B group vitamins: Secondary | ICD-10-CM | POA: Diagnosis not present

## 2021-02-01 DIAGNOSIS — I2510 Atherosclerotic heart disease of native coronary artery without angina pectoris: Secondary | ICD-10-CM | POA: Diagnosis not present

## 2021-02-01 DIAGNOSIS — E785 Hyperlipidemia, unspecified: Secondary | ICD-10-CM | POA: Diagnosis not present

## 2021-02-01 DIAGNOSIS — E1169 Type 2 diabetes mellitus with other specified complication: Secondary | ICD-10-CM | POA: Diagnosis not present

## 2021-02-01 DIAGNOSIS — I4891 Unspecified atrial fibrillation: Secondary | ICD-10-CM | POA: Diagnosis not present

## 2021-02-01 DIAGNOSIS — E291 Testicular hypofunction: Secondary | ICD-10-CM | POA: Diagnosis not present

## 2021-02-01 DIAGNOSIS — J019 Acute sinusitis, unspecified: Secondary | ICD-10-CM | POA: Diagnosis not present

## 2021-02-03 ENCOUNTER — Telehealth: Payer: Self-pay

## 2021-02-03 NOTE — Progress Notes (Signed)
EPIC Encounter for ICM Monitoring  Patient Name: Walter [name] is a 75 y.o. male Date: 02/03/2021 Primary Care Physican: London Pepper, MD [place] Cardiologist: Aundra Dubin Electrophysiologist: Allred Bi-V Pacing: 97%         10/11/2020 Weight: 243-245 lbs (baseline 240-241 lb                                           Attempted call to patient and unable to reach.  Left detailed message per DPR regarding transmission. Transmission reviewed.    CorVue thoracic impedance suggesting normal fluid levels    Furosemide 20 mg Take 1 tablet (20 mg total) by mouth every other day.  He reports 10/11/2020, he takes Furosemide PRN instead of every other day.    Labs:  07/10/2020 Creatinine 1.55, BUN 35, Potassium 3.5, Sodium 127, GFR 46 04/15/2020 Creatinine 1.12, BUN 11, Potassium 4.0, Sodium 138 A complete set of results can be found in Results Review.   Recommendations:  Left voice mail with ICM number and encouraged to call if experiencing any fluid symptoms.   Follow-up plan: ICM clinic phone appointment on 03/06/2021.  91 day device clinic remote transmission 02/15/2021.     EP/Cardiology Office Visits: Recall 10/30/2021 with Dr. Rayann Heman.  Recall 10/12/2020 with Dr Aundra Dubin   Copy of ICM check sent to Dr. Rayann Heman.     3 month ICM trend: 01/30/2021.    12-14 Month ICM trend:       Rosalene Billings, RN 02/03/2021 8:08 AM

## 2021-02-03 NOTE — Telephone Encounter (Signed)
Remote ICM transmission received.  Attempted call to patient regarding ICM remote transmission and left detailed message per DPR.  Advised to return call for any fluid symptoms or questions. Next ICM remote transmission scheduled 03/06/2021.

## 2021-02-08 DIAGNOSIS — Z20828 Contact with and (suspected) exposure to other viral communicable diseases: Secondary | ICD-10-CM | POA: Diagnosis not present

## 2021-02-15 ENCOUNTER — Ambulatory Visit (INDEPENDENT_AMBULATORY_CARE_PROVIDER_SITE_OTHER): Payer: Medicare Other

## 2021-02-15 DIAGNOSIS — M25559 Pain in unspecified hip: Secondary | ICD-10-CM | POA: Diagnosis not present

## 2021-02-15 DIAGNOSIS — I428 Other cardiomyopathies: Secondary | ICD-10-CM | POA: Diagnosis not present

## 2021-02-15 DIAGNOSIS — M47817 Spondylosis without myelopathy or radiculopathy, lumbosacral region: Secondary | ICD-10-CM | POA: Diagnosis not present

## 2021-02-15 DIAGNOSIS — G894 Chronic pain syndrome: Secondary | ICD-10-CM | POA: Diagnosis not present

## 2021-02-15 DIAGNOSIS — M533 Sacrococcygeal disorders, not elsewhere classified: Secondary | ICD-10-CM | POA: Diagnosis not present

## 2021-02-15 LAB — CUP PACEART REMOTE DEVICE CHECK
Battery Remaining Longevity: 9 mo
Battery Remaining Percentage: 10 %
Battery Voltage: 2.69 V
Brady Statistic AP VP Percent: 3.3 %
Brady Statistic AP VS Percent: 1 %
Brady Statistic AS VP Percent: 94 %
Brady Statistic AS VS Percent: 2.4 %
Brady Statistic RA Percent Paced: 3.6 %
Date Time Interrogation Session: 20221130020015
HighPow Impedance: 75 Ohm
HighPow Impedance: 75 Ohm
Implantable Lead Implant Date: 20151119
Implantable Lead Implant Date: 20151119
Implantable Lead Implant Date: 20151119
Implantable Lead Location: 753858
Implantable Lead Location: 753859
Implantable Lead Location: 753860
Implantable Pulse Generator Implant Date: 20151119
Lead Channel Impedance Value: 390 Ohm
Lead Channel Impedance Value: 450 Ohm
Lead Channel Impedance Value: 550 Ohm
Lead Channel Pacing Threshold Amplitude: 0.5 V
Lead Channel Pacing Threshold Amplitude: 0.75 V
Lead Channel Pacing Threshold Amplitude: 1.25 V
Lead Channel Pacing Threshold Pulse Width: 0.5 ms
Lead Channel Pacing Threshold Pulse Width: 0.5 ms
Lead Channel Pacing Threshold Pulse Width: 0.5 ms
Lead Channel Sensing Intrinsic Amplitude: 12 mV
Lead Channel Sensing Intrinsic Amplitude: 3.1 mV
Lead Channel Setting Pacing Amplitude: 1.5 V
Lead Channel Setting Pacing Amplitude: 2 V
Lead Channel Setting Pacing Amplitude: 2.5 V
Lead Channel Setting Pacing Pulse Width: 0.5 ms
Lead Channel Setting Pacing Pulse Width: 0.5 ms
Lead Channel Setting Sensing Sensitivity: 0.5 mV
Pulse Gen Serial Number: 7211486

## 2021-02-24 NOTE — Progress Notes (Signed)
Remote ICD transmission.   

## 2021-02-28 DIAGNOSIS — Z23 Encounter for immunization: Secondary | ICD-10-CM | POA: Diagnosis not present

## 2021-03-06 ENCOUNTER — Telehealth: Payer: Self-pay

## 2021-03-06 ENCOUNTER — Ambulatory Visit (INDEPENDENT_AMBULATORY_CARE_PROVIDER_SITE_OTHER): Payer: Medicare Other

## 2021-03-06 DIAGNOSIS — Z95810 Presence of automatic (implantable) cardiac defibrillator: Secondary | ICD-10-CM

## 2021-03-06 DIAGNOSIS — I5022 Chronic systolic (congestive) heart failure: Secondary | ICD-10-CM | POA: Diagnosis not present

## 2021-03-06 NOTE — Progress Notes (Signed)
EPIC Encounter for ICM Monitoring  Patient Name: Walter [name] is a 75 y.o. male Date: 03/06/2021 Primary Care Physican: London Pepper, MD [place] Cardiologist: Aundra Dubin Electrophysiologist: Allred Bi-V Pacing: 97%         10/24/2020 Weight: 243-245 lbs (baseline 240-241 lb                                           Attempted call to patient and unable to reach.  Left detailed message per DPR regarding transmission. Transmission reviewed.    CorVue thoracic impedance suggesting possible fluid accumulation starting 12/9.   Furosemide 20 mg Take 1 tablet (20 mg total) by mouth every other day.  He reports 10/11/2020, he takes Furosemide PRN instead of every other day.    Labs:  07/10/2020 Creatinine 1.55, BUN 35, Potassium 3.5, Sodium 127, GFR 46 04/15/2020 Creatinine 1.12, BUN 11, Potassium 4.0, Sodium 138 A complete set of results can be found in Results Review.   Recommendations:  Left voice mail with ICM number and encouraged to call if experiencing any fluid symptoms.   Follow-up plan: ICM clinic phone appointment on 03/09/2021 (manual) to recheck fluid levels.  91 day device clinic remote transmission 02/15/2021.     EP/Cardiology Office Visits: Recall 10/30/2021 with Dr. Rayann Heman.  Recall 10/12/2020 with Dr Aundra Dubin   Copy of ICM check sent to Dr. Rayann Heman.   Will send to Dr Aundra Dubin for review if patient is reached.   3 month ICM trend: 03/06/2021.    12-14 Month ICM trend:       Rosalene Billings, RN 03/06/2021 3:47 PM

## 2021-03-06 NOTE — Telephone Encounter (Signed)
Remote ICM transmission received.  Attempted call to patient regarding ICM remote transmission and left detailed message per DPR.  Advised to return call for any fluid symptoms or questions. Next ICM remote transmission scheduled 03/09/2021.

## 2021-03-09 ENCOUNTER — Telehealth: Payer: Self-pay

## 2021-03-09 NOTE — Telephone Encounter (Signed)
Attempted ICM Call to patient to request remote transmission for fluid level review.  No answer.

## 2021-03-09 NOTE — Progress Notes (Signed)
No ICM remote transmission received for 03/09/2021 and next ICM transmission scheduled for 04/10/2021.

## 2021-03-16 ENCOUNTER — Telehealth (HOSPITAL_COMMUNITY): Payer: Self-pay | Admitting: *Deleted

## 2021-03-16 NOTE — Telephone Encounter (Signed)
Pt in office with his wife and request Eliquis samples. Samples provided  Medication Samples have been provided to the patient.  Drug name: Eliquis       Strength: 5mg         Qty: 3  LOT: [id number]  Exp.Date: 11/24  Dosing instructions: Take 1 tab Twice daily   The patient has been instructed regarding the correct time, dose, and frequency of taking this medication, including desired effects and most common side effects.   Laiba Fuerte 10:56 AM 03/16/2021

## 2021-03-21 DIAGNOSIS — G894 Chronic pain syndrome: Secondary | ICD-10-CM | POA: Diagnosis not present

## 2021-03-21 DIAGNOSIS — M47817 Spondylosis without myelopathy or radiculopathy, lumbosacral region: Secondary | ICD-10-CM | POA: Diagnosis not present

## 2021-03-21 DIAGNOSIS — M1990 Unspecified osteoarthritis, unspecified site: Secondary | ICD-10-CM | POA: Diagnosis not present

## 2021-03-21 DIAGNOSIS — M533 Sacrococcygeal disorders, not elsewhere classified: Secondary | ICD-10-CM | POA: Diagnosis not present

## 2021-04-10 ENCOUNTER — Ambulatory Visit (INDEPENDENT_AMBULATORY_CARE_PROVIDER_SITE_OTHER): Payer: Medicare Other

## 2021-04-10 DIAGNOSIS — I5022 Chronic systolic (congestive) heart failure: Secondary | ICD-10-CM | POA: Diagnosis not present

## 2021-04-10 DIAGNOSIS — Z95810 Presence of automatic (implantable) cardiac defibrillator: Secondary | ICD-10-CM | POA: Diagnosis not present

## 2021-04-12 ENCOUNTER — Telehealth: Payer: Self-pay

## 2021-04-12 NOTE — Telephone Encounter (Signed)
Remote ICM transmission received.  Attempted call to patient regarding ICM remote transmission and left detailed message per DPR.  Advised to return call for any fluid symptoms or questions. Next ICM remote transmission scheduled 05/15/2021.

## 2021-04-12 NOTE — Progress Notes (Signed)
EPIC Encounter for ICM Monitoring  Patient Name: Walter [name] is a 76 y.o. male Date: 04/12/2021 Primary Care Physican: London Pepper, MD Primary Cardiologist: Aundra Dubin Electrophysiologist: Allred Bi-V Pacing: 97%         10/24/2020 Weight: 243-245 lbs (baseline 240-241 lb  Battery ERI: 7.5 months                                          Attempted call to patient and unable to reach.  Left detailed message per DPR regarding transmission. Transmission reviewed.    CorVue thoracic impedance normal but suggesting was possible fluid accumulation 1/9-1/17.  Furosemide 20 mg Take 1 tablet (20 mg total) by mouth every other day.  He reports 10/11/2020, he takes Furosemide PRN instead of every other day.    Labs:  07/10/2020 Creatinine 1.55, BUN 35, Potassium 3.5, Sodium 127, GFR 46 04/15/2020 Creatinine 1.12, BUN 11, Potassium 4.0, Sodium 138 A complete set of results can be found in Results Review.   Recommendations:  Left voice mail with ICM number and encouraged to call if experiencing any fluid symptoms.   Follow-up plan: ICM clinic phone appointment on 05/15/2021.  91 day device clinic remote transmission 05/17/2021.     EP/Cardiology Office Visits: Recall 10/30/2021 with Dr. Rayann Heman.  Recall 10/12/2020 with Dr Aundra Dubin   Copy of ICM check sent to Dr. Rayann Heman.   3 month ICM trend: 04/10/2021.    12-14 Month ICM trend:     Rosalene Billings, RN 04/12/2021 8:26 AM

## 2021-04-19 DIAGNOSIS — G894 Chronic pain syndrome: Secondary | ICD-10-CM | POA: Diagnosis not present

## 2021-04-19 DIAGNOSIS — Z79899 Other long term (current) drug therapy: Secondary | ICD-10-CM | POA: Diagnosis not present

## 2021-04-19 DIAGNOSIS — M1710 Unilateral primary osteoarthritis, unspecified knee: Secondary | ICD-10-CM | POA: Diagnosis not present

## 2021-04-19 DIAGNOSIS — Z79891 Long term (current) use of opiate analgesic: Secondary | ICD-10-CM | POA: Diagnosis not present

## 2021-04-19 DIAGNOSIS — M47817 Spondylosis without myelopathy or radiculopathy, lumbosacral region: Secondary | ICD-10-CM | POA: Diagnosis not present

## 2021-04-19 DIAGNOSIS — M533 Sacrococcygeal disorders, not elsewhere classified: Secondary | ICD-10-CM | POA: Diagnosis not present

## 2021-05-04 DIAGNOSIS — I10 Essential (primary) hypertension: Secondary | ICD-10-CM | POA: Diagnosis not present

## 2021-05-04 DIAGNOSIS — Z7984 Long term (current) use of oral hypoglycemic drugs: Secondary | ICD-10-CM | POA: Diagnosis not present

## 2021-05-04 DIAGNOSIS — E291 Testicular hypofunction: Secondary | ICD-10-CM | POA: Diagnosis not present

## 2021-05-04 DIAGNOSIS — R1013 Epigastric pain: Secondary | ICD-10-CM | POA: Diagnosis not present

## 2021-05-04 DIAGNOSIS — Z125 Encounter for screening for malignant neoplasm of prostate: Secondary | ICD-10-CM | POA: Diagnosis not present

## 2021-05-04 DIAGNOSIS — E1169 Type 2 diabetes mellitus with other specified complication: Secondary | ICD-10-CM | POA: Diagnosis not present

## 2021-05-04 DIAGNOSIS — E785 Hyperlipidemia, unspecified: Secondary | ICD-10-CM | POA: Diagnosis not present

## 2021-05-15 ENCOUNTER — Ambulatory Visit (INDEPENDENT_AMBULATORY_CARE_PROVIDER_SITE_OTHER): Payer: Medicare Other

## 2021-05-15 DIAGNOSIS — I5022 Chronic systolic (congestive) heart failure: Secondary | ICD-10-CM

## 2021-05-15 DIAGNOSIS — Z95810 Presence of automatic (implantable) cardiac defibrillator: Secondary | ICD-10-CM | POA: Diagnosis not present

## 2021-05-16 ENCOUNTER — Telehealth: Payer: Self-pay

## 2021-05-16 NOTE — Telephone Encounter (Signed)
Remote ICM transmission received.  Attempted call to patient regarding ICM remote transmission and left detailed message per DPR to return call.  Advised to return call for any fluid symptoms or questions. Next ICM remote transmission scheduled 05/24/2021.

## 2021-05-16 NOTE — Progress Notes (Signed)
EPIC Encounter for ICM Monitoring  Patient Name: Walter [name] is a 76 y.o. male Date: 05/16/2021 Primary Care Physican: London Pepper, MD Primary Cardiologist: Aundra Dubin Electrophysiologist: Allred Bi-V Pacing: 97%         10/24/2020 Weight: 243-245 lbs (baseline 240-241 lb   Battery ERI: 6.5 months                                          Attempted call to patient and unable to reach.  Left detailed message per DPR regarding transmission. Transmission reviewed.    CorVue thoracic impedance suggesting possible fluid accumulation starting 2/22.   Furosemide 20 mg Take 1 tablet (20 mg total) by mouth every other day.  He reports 10/11/2020, he takes Furosemide PRN instead of every other day.    Labs:  07/10/2020 Creatinine 1.55, BUN 35, Potassium 3.5, Sodium 127, GFR 46 04/15/2020 Creatinine 1.12, BUN 11, Potassium 4.0, Sodium 138 A complete set of results can be found in Results Review.   Recommendations:  Left voice mail with ICM number and encouraged to call if experiencing any fluid symptoms.   Follow-up plan: ICM clinic phone appointment on 05/24/2021 to recheck fluid levels.  91 day device clinic remote transmission 05/17/2021.     EP/Cardiology Office Visits: Recall 10/30/2021 with Dr. Rayann Heman.  Recall 10/12/2020 with Dr Aundra Dubin   Copy of ICM check sent to Dr. Rayann Heman.  Will send to Dr Aundra Dubin for review if needed when patient is reached.  3 month ICM trend: 05/16/2021.    12-14 Month ICM trend:     Rosalene Billings, RN 05/16/2021 8:09 AM

## 2021-05-17 ENCOUNTER — Ambulatory Visit: Payer: Medicare Other

## 2021-05-18 ENCOUNTER — Telehealth: Payer: Self-pay

## 2021-05-18 LAB — CUP PACEART REMOTE DEVICE CHECK
Battery Remaining Longevity: 6 mo
Battery Remaining Percentage: 7 %
Battery Voltage: 2.65 V
Brady Statistic AP VP Percent: 3.2 %
Brady Statistic AP VS Percent: 1 %
Brady Statistic AS VP Percent: 94 %
Brady Statistic AS VS Percent: 2 %
Brady Statistic RA Percent Paced: 3.4 %
Date Time Interrogation Session: 20230302104026
HighPow Impedance: 72 Ohm
HighPow Impedance: 72 Ohm
Implantable Lead Implant Date: 20151119
Implantable Lead Implant Date: 20151119
Implantable Lead Implant Date: 20151119
Implantable Lead Location: 753858
Implantable Lead Location: 753859
Implantable Lead Location: 753860
Implantable Pulse Generator Implant Date: 20151119
Lead Channel Impedance Value: 360 Ohm
Lead Channel Impedance Value: 440 Ohm
Lead Channel Impedance Value: 530 Ohm
Lead Channel Pacing Threshold Amplitude: 0.5 V
Lead Channel Pacing Threshold Amplitude: 0.75 V
Lead Channel Pacing Threshold Amplitude: 1.25 V
Lead Channel Pacing Threshold Pulse Width: 0.5 ms
Lead Channel Pacing Threshold Pulse Width: 0.5 ms
Lead Channel Pacing Threshold Pulse Width: 0.5 ms
Lead Channel Sensing Intrinsic Amplitude: 12 mV
Lead Channel Sensing Intrinsic Amplitude: 2.9 mV
Lead Channel Setting Pacing Amplitude: 1.5 V
Lead Channel Setting Pacing Amplitude: 2 V
Lead Channel Setting Pacing Amplitude: 2.5 V
Lead Channel Setting Pacing Pulse Width: 0.5 ms
Lead Channel Setting Pacing Pulse Width: 0.5 ms
Lead Channel Setting Sensing Sensitivity: 0.5 mV
Pulse Gen Serial Number: 7211486

## 2021-05-18 NOTE — Telephone Encounter (Signed)
Increased ICD checks to monthly. Patient called and made aware.  ?

## 2021-05-19 ENCOUNTER — Ambulatory Visit (INDEPENDENT_AMBULATORY_CARE_PROVIDER_SITE_OTHER): Payer: Medicare Other

## 2021-05-19 DIAGNOSIS — I5022 Chronic systolic (congestive) heart failure: Secondary | ICD-10-CM

## 2021-05-19 DIAGNOSIS — Z95810 Presence of automatic (implantable) cardiac defibrillator: Secondary | ICD-10-CM

## 2021-05-19 NOTE — Progress Notes (Signed)
EPIC Encounter for ICM Monitoring ? ?Patient Name: Walter [name] is a 76 y.o. male ?Date: 05/19/2021 ?Primary Care Physican: London Pepper, MD ?Primary Cardiologist: Aundra Dubin ?Electrophysiologist: Allred ?Bi-V Pacing: 97%         ?05/19/2021 Weight: 243-245 lbs (baseline 240-241 lb) ?  ?Battery ERI: 6.5 months                                        ?  ?Spoke with patient and heart failure questions reviewed.  Pt asymptomatic for fluid accumulation.  Reports feeling well at this time and voices no complaints.  ?  ?CorVue thoracic impedance suggesting fluid levels returned to normal. ?  ?Furosemide 20 mg Take 1 tablet (20 mg total) by mouth every other day.  He reports 10/11/2020, he takes Furosemide PRN instead of every other day. ?   ?Labs:  ?07/10/2020 Creatinine 1.55, BUN 35, Potassium 3.5, Sodium 127, GFR 46 ?04/15/2020 Creatinine 1.12, BUN 11, Potassium 4.0, Sodium 138 ?A complete set of results can be found in Results Review. ?  ?Recommendations:  No changes and encouraged to call if experiencing any fluid symptoms. ?  ?Follow-up plan: ICM clinic phone appointment on 06/19/2021.  91 day device clinic remote transmission 05/17/2021.   ?  ?EP/Cardiology Office Visits: Recall 10/30/2021 with Dr. Rayann Heman.  Recall 10/12/2020 with Dr Aundra Dubin ?  ?Copy of ICM check sent to Dr. Rayann Heman.  .  ? ?3 month ICM trend: 05/19/2021. ? ? ? ?12-14 Month ICM trend:  ? ? ? ?Rosalene Billings, RN ?05/19/2021 ?7:51 AM ? ?

## 2021-05-22 DIAGNOSIS — M1710 Unilateral primary osteoarthritis, unspecified knee: Secondary | ICD-10-CM | POA: Diagnosis not present

## 2021-05-22 DIAGNOSIS — M47817 Spondylosis without myelopathy or radiculopathy, lumbosacral region: Secondary | ICD-10-CM | POA: Diagnosis not present

## 2021-05-22 DIAGNOSIS — M533 Sacrococcygeal disorders, not elsewhere classified: Secondary | ICD-10-CM | POA: Diagnosis not present

## 2021-05-22 DIAGNOSIS — M1990 Unspecified osteoarthritis, unspecified site: Secondary | ICD-10-CM | POA: Diagnosis not present

## 2021-06-02 ENCOUNTER — Other Ambulatory Visit (HOSPITAL_COMMUNITY): Payer: Self-pay | Admitting: Cardiology

## 2021-06-08 ENCOUNTER — Ambulatory Visit (INDEPENDENT_AMBULATORY_CARE_PROVIDER_SITE_OTHER): Payer: Medicare Other | Admitting: Physician Assistant

## 2021-06-08 ENCOUNTER — Other Ambulatory Visit: Payer: Self-pay

## 2021-06-08 DIAGNOSIS — D044 Carcinoma in situ of skin of scalp and neck: Secondary | ICD-10-CM

## 2021-06-08 DIAGNOSIS — L570 Actinic keratosis: Secondary | ICD-10-CM

## 2021-06-08 DIAGNOSIS — Z1283 Encounter for screening for malignant neoplasm of skin: Secondary | ICD-10-CM

## 2021-06-08 DIAGNOSIS — D485 Neoplasm of uncertain behavior of skin: Secondary | ICD-10-CM

## 2021-06-08 DIAGNOSIS — L821 Other seborrheic keratosis: Secondary | ICD-10-CM | POA: Diagnosis not present

## 2021-06-08 NOTE — Patient Instructions (Signed)

## 2021-06-13 ENCOUNTER — Encounter: Payer: Self-pay | Admitting: Physician Assistant

## 2021-06-13 ENCOUNTER — Telehealth: Payer: Self-pay | Admitting: *Deleted

## 2021-06-13 NOTE — Telephone Encounter (Signed)
Left patient message to call back with results.  ?

## 2021-06-13 NOTE — Telephone Encounter (Signed)
-----   Message from Warren Danes, [place] sent at 06/13/2021  8:14 AM EDT ----- ?Tx with bx. RTC if recurs recheck 9 mo to 1 yr.  ?

## 2021-06-13 NOTE — Progress Notes (Signed)
? ?New Patient ?  ?Subjective  ?Walter [name] is a 76 y.o. male who presents for the following: Skin Problem (Here for lesion check. Concerns top of scalp. No history of skin cancers. ). ? ? ?The following portions of the chart were reviewed this encounter and updated as appropriate:  Tobacco  Allergies  Meds  Problems  Med Hx  Surg Hx  Fam Hx   ?  ? ?Objective  ?Well appearing patient in no apparent distress; mood and affect are within normal limits. ? ?All skin waist up examined. ? ?Left Temple, Mid Parietal Scalp (4) ?Erythematous patches with gritty scale. ? ?Left Anterior Neck ? ? ? ? ? ? ?Mid Parietal Scalp ?Scaly pink plaque.  ? ? ? ? ? ? ? ?Assessment & Plan  ?AK (actinic keratosis) (5) ?Mid Parietal Scalp (4); Left Temple ? ?Destruction of lesion - Left Temple, Mid Parietal Scalp ?Complexity: simple   ?Destruction method: cryotherapy   ?Informed consent: discussed and consent obtained   ?Timeout:  patient name, date of birth, surgical site, and procedure verified ?Lesion destroyed using liquid nitrogen: Yes   ?Cryotherapy cycles:  3 ?Outcome: patient tolerated procedure well with no complications   ? ?Neoplasm of uncertain behavior of skin ?Left Anterior Neck ? ?Skin / nail biopsy ?Type of biopsy: tangential   ?Informed consent: discussed and consent obtained   ?Timeout: patient name, date of birth, surgical site, and procedure verified   ?Procedure prep:  Patient was prepped and draped in usual sterile fashion (Non sterile) ?Prep type:  Chlorhexidine ?Anesthesia: the lesion was anesthetized in a standard fashion   ?Anesthetic:  1% lidocaine w/ epinephrine 1-100,000 local infiltration ?Instrument used: flexible razor blade   ?Outcome: patient tolerated procedure well   ?Post-procedure details: wound care instructions given   ? ?Destruction of lesion ?Complexity: simple   ?Destruction method: electrodesiccation and curettage   ?Informed consent: discussed and consent obtained   ?Timeout:  patient  name, date of birth, surgical site, and procedure verified ?Anesthesia: the lesion was anesthetized in a standard fashion   ?Anesthetic:  1% lidocaine w/ epinephrine 1-100,000 local infiltration ?Curettage performed in three different directions: Yes   ?Electrodesiccation performed over the curetted area: Yes   ?Curettage cycles:  3 ?Margin per side (cm):  0.1 ?Final wound size (cm):  1.2 ?Hemostasis achieved with:  aluminum chloride ?Outcome: patient tolerated procedure well with no complications   ?Post-procedure details: wound care instructions given   ? ?Specimen 1 - Surgical pathology ?Differential Diagnosis: bcc vs [id number]-[id number] ? ?Check Margins: No ? 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 ?Skin / nail biopsy ?Type of biopsy: tangential   ?Informed consent: discussed and consent obtained   ?Timeout: patient name, date of birth, surgical site, and procedure verified   ?Procedure prep:  Patient was prepped and draped in usual sterile fashion (Non sterile) ?Prep type:  Chlorhexidine ?Anesthesia: the lesion was anesthetized in a standard fashion   ?Anesthetic:  1% lidocaine w/ epinephrine 1-100,000 local infiltration ?Instrument used: flexible razor blade   ?Outcome: patient tolerated procedure well   ?Post-procedure details: wound care instructions given   ? ?Destruction of lesion ?Complexity: simple   ?Destruction method: electrodesiccation and curettage   ?Informed consent: discussed and consent obtained   ?Timeout:  patient name, date of birth, surgical site, and procedure verified ?Anesthesia: the lesion was anesthetized in a standard fashion   ?Anesthetic:  1% lidocaine w/ epinephrine 1-100,000 local infiltration ?Curettage performed in three  different directions: Yes   ?Electrodesiccation performed over the curetted area: Yes   ?Curettage cycles:  3 ?Margin per side (cm):  0.1 ?Final wound size (cm):  1.3 ?Hemostasis achieved with:  aluminum chloride ?Outcome: patient tolerated procedure well  with no complications   ?Post-procedure details: wound care instructions given   ? ?Specimen 2 - Surgical pathology ?Differential Diagnosis: bcc vs scc-txpbx ? ?Check Margins: No ? ? ?No atypical nevi noted at the time of the visit. ? ?I, Trynity Skousen, PA-C, have reviewed all documentation's for this visit.  The documentation on 06/13/21 for the exam, diagnosis, procedures and orders are all accurate and complete. ?

## 2021-06-13 NOTE — Telephone Encounter (Signed)
Path to patient follow up with yearly skin exam.  ?

## 2021-06-19 ENCOUNTER — Ambulatory Visit (INDEPENDENT_AMBULATORY_CARE_PROVIDER_SITE_OTHER): Payer: Medicare Other

## 2021-06-19 ENCOUNTER — Telehealth: Payer: Self-pay

## 2021-06-19 DIAGNOSIS — Z95810 Presence of automatic (implantable) cardiac defibrillator: Secondary | ICD-10-CM

## 2021-06-19 DIAGNOSIS — I5022 Chronic systolic (congestive) heart failure: Secondary | ICD-10-CM | POA: Diagnosis not present

## 2021-06-19 DIAGNOSIS — I428 Other cardiomyopathies: Secondary | ICD-10-CM

## 2021-06-19 NOTE — Telephone Encounter (Signed)
Remote ICM transmission received.  Attempted call to patient regarding ICM remote transmission and left detailed message per DPR.  Advised to return call for any fluid symptoms or questions. Next ICM remote transmission scheduled 07/24/2021.   ? ?

## 2021-06-19 NOTE — Progress Notes (Signed)
EPIC Encounter for ICM Monitoring ? ?Patient Name: Walter [name] is a 76 y.o. male ?Date: 06/19/2021 ?Primary Care Physican: London Pepper, MD ?Primary Cardiologist: Aundra Dubin ?Electrophysiologist: Allred ?Bi-V Pacing: 97%         ?05/19/2021 Weight: 243-245 lbs (baseline 240-241 lb) ?  ?Battery ERI: 5.5 months                                        ?  ?Attempted call to patient and unable to reach.  Left detailed message per DPR regarding transmission. Transmission reviewed.  ?  ?CorVue thoracic impedance suggesting possible fluid accumulation starting 3/30 and returned close to baseline 4/3. ?  ?Furosemide 20 mg Take 1 tablet (20 mg total) by mouth every other day.  He reports 10/11/2020, he takes Furosemide PRN instead of every other day. ?   ?Labs:  ?07/10/2020 Creatinine 1.55, BUN 35, Potassium 3.5, Sodium 127, GFR 46 ?04/15/2020 Creatinine 1.12, BUN 11, Potassium 4.0, Sodium 138 ?A complete set of results can be found in Results Review. ?  ?Recommendations:  Left voice mail with ICM number and encouraged to call if experiencing any fluid symptoms. ?  ?Follow-up plan: ICM clinic phone appointment on 07/24/2021.  91 day device clinic remote transmission 07/20/2021.   ?  ?EP/Cardiology Office Visits: Recall 10/30/2021 with Dr. Rayann Heman.  Recall 10/12/2020 with Dr Aundra Dubin ?  ?Copy of ICM check sent to Dr. Rayann Heman.   ? ?3 month ICM trend: 06/19/2021. ? ? ? ?12-14 Month ICM trend:  ? ? ? ?Rosalene Billings, RN ?06/19/2021 ?9:52 AM ? ?

## 2021-06-20 DIAGNOSIS — Z79891 Long term (current) use of opiate analgesic: Secondary | ICD-10-CM | POA: Diagnosis not present

## 2021-06-20 DIAGNOSIS — M25559 Pain in unspecified hip: Secondary | ICD-10-CM | POA: Diagnosis not present

## 2021-06-20 DIAGNOSIS — M5459 Other low back pain: Secondary | ICD-10-CM | POA: Diagnosis not present

## 2021-06-20 DIAGNOSIS — M25569 Pain in unspecified knee: Secondary | ICD-10-CM | POA: Diagnosis not present

## 2021-06-20 DIAGNOSIS — G894 Chronic pain syndrome: Secondary | ICD-10-CM | POA: Diagnosis not present

## 2021-06-20 DIAGNOSIS — M25519 Pain in unspecified shoulder: Secondary | ICD-10-CM | POA: Diagnosis not present

## 2021-06-20 DIAGNOSIS — M47817 Spondylosis without myelopathy or radiculopathy, lumbosacral region: Secondary | ICD-10-CM | POA: Diagnosis not present

## 2021-06-20 DIAGNOSIS — M533 Sacrococcygeal disorders, not elsewhere classified: Secondary | ICD-10-CM | POA: Diagnosis not present

## 2021-06-20 DIAGNOSIS — M47818 Spondylosis without myelopathy or radiculopathy, sacral and sacrococcygeal region: Secondary | ICD-10-CM | POA: Diagnosis not present

## 2021-06-20 LAB — CUP PACEART REMOTE DEVICE CHECK
Battery Remaining Longevity: 6 mo
Battery Remaining Percentage: 6 %
Battery Voltage: 2.65 V
Brady Statistic AP VP Percent: 3.3 %
Brady Statistic AP VS Percent: 1 %
Brady Statistic AS VP Percent: 94 %
Brady Statistic AS VS Percent: 1.9 %
Brady Statistic RA Percent Paced: 3.6 %
Date Time Interrogation Session: 20230403033512
HighPow Impedance: 78 Ohm
HighPow Impedance: 78 Ohm
Implantable Lead Implant Date: 20151119
Implantable Lead Implant Date: 20151119
Implantable Lead Implant Date: 20151119
Implantable Lead Location: 753858
Implantable Lead Location: 753859
Implantable Lead Location: 753860
Implantable Pulse Generator Implant Date: 20151119
Lead Channel Impedance Value: 380 Ohm
Lead Channel Impedance Value: 440 Ohm
Lead Channel Impedance Value: 550 Ohm
Lead Channel Pacing Threshold Amplitude: 0.5 V
Lead Channel Pacing Threshold Amplitude: 0.75 V
Lead Channel Pacing Threshold Amplitude: 1.25 V
Lead Channel Pacing Threshold Pulse Width: 0.5 ms
Lead Channel Pacing Threshold Pulse Width: 0.5 ms
Lead Channel Pacing Threshold Pulse Width: 0.5 ms
Lead Channel Sensing Intrinsic Amplitude: 12 mV
Lead Channel Sensing Intrinsic Amplitude: 2.6 mV
Lead Channel Setting Pacing Amplitude: 1.5 V
Lead Channel Setting Pacing Amplitude: 2 V
Lead Channel Setting Pacing Amplitude: 2.5 V
Lead Channel Setting Pacing Pulse Width: 0.5 ms
Lead Channel Setting Pacing Pulse Width: 0.5 ms
Lead Channel Setting Sensing Sensitivity: 0.5 mV
Pulse Gen Serial Number: 7211486

## 2021-06-26 DIAGNOSIS — M47816 Spondylosis without myelopathy or radiculopathy, lumbar region: Secondary | ICD-10-CM | POA: Diagnosis not present

## 2021-07-03 NOTE — Progress Notes (Signed)
Remote ICD transmission.   

## 2021-07-06 DIAGNOSIS — Z20822 Contact with and (suspected) exposure to covid-19: Secondary | ICD-10-CM | POA: Diagnosis not present

## 2021-07-18 DIAGNOSIS — Z20822 Contact with and (suspected) exposure to covid-19: Secondary | ICD-10-CM | POA: Diagnosis not present

## 2021-07-20 ENCOUNTER — Ambulatory Visit (INDEPENDENT_AMBULATORY_CARE_PROVIDER_SITE_OTHER): Payer: Medicare Other

## 2021-07-20 DIAGNOSIS — G894 Chronic pain syndrome: Secondary | ICD-10-CM | POA: Diagnosis not present

## 2021-07-20 DIAGNOSIS — M533 Sacrococcygeal disorders, not elsewhere classified: Secondary | ICD-10-CM | POA: Diagnosis not present

## 2021-07-20 DIAGNOSIS — M25519 Pain in unspecified shoulder: Secondary | ICD-10-CM | POA: Diagnosis not present

## 2021-07-20 DIAGNOSIS — M1710 Unilateral primary osteoarthritis, unspecified knee: Secondary | ICD-10-CM | POA: Diagnosis not present

## 2021-07-20 DIAGNOSIS — M25559 Pain in unspecified hip: Secondary | ICD-10-CM | POA: Diagnosis not present

## 2021-07-20 DIAGNOSIS — I5022 Chronic systolic (congestive) heart failure: Secondary | ICD-10-CM | POA: Diagnosis not present

## 2021-07-20 DIAGNOSIS — I428 Other cardiomyopathies: Secondary | ICD-10-CM

## 2021-07-20 DIAGNOSIS — M47817 Spondylosis without myelopathy or radiculopathy, lumbosacral region: Secondary | ICD-10-CM | POA: Diagnosis not present

## 2021-07-20 DIAGNOSIS — M47816 Spondylosis without myelopathy or radiculopathy, lumbar region: Secondary | ICD-10-CM | POA: Diagnosis not present

## 2021-07-20 DIAGNOSIS — Z79899 Other long term (current) drug therapy: Secondary | ICD-10-CM | POA: Diagnosis not present

## 2021-07-20 DIAGNOSIS — Z79891 Long term (current) use of opiate analgesic: Secondary | ICD-10-CM | POA: Diagnosis not present

## 2021-07-20 DIAGNOSIS — M47818 Spondylosis without myelopathy or radiculopathy, sacral and sacrococcygeal region: Secondary | ICD-10-CM | POA: Diagnosis not present

## 2021-07-20 DIAGNOSIS — M1990 Unspecified osteoarthritis, unspecified site: Secondary | ICD-10-CM | POA: Diagnosis not present

## 2021-07-20 DIAGNOSIS — M25569 Pain in unspecified knee: Secondary | ICD-10-CM | POA: Diagnosis not present

## 2021-07-20 LAB — CUP PACEART REMOTE DEVICE CHECK
Battery Remaining Longevity: 5 mo
Battery Remaining Percentage: 6 %
Battery Voltage: 2.63 V
Brady Statistic AP VP Percent: 3.7 %
Brady Statistic AP VS Percent: 1 %
Brady Statistic AS VP Percent: 94 %
Brady Statistic AS VS Percent: 1.8 %
Brady Statistic RA Percent Paced: 4 %
Date Time Interrogation Session: 20230504020027
HighPow Impedance: 83 Ohm
HighPow Impedance: 83 Ohm
Implantable Lead Implant Date: 20151119
Implantable Lead Implant Date: 20151119
Implantable Lead Implant Date: 20151119
Implantable Lead Location: 753858
Implantable Lead Location: 753859
Implantable Lead Location: 753860
Implantable Pulse Generator Implant Date: 20151119
Lead Channel Impedance Value: 400 Ohm
Lead Channel Impedance Value: 460 Ohm
Lead Channel Impedance Value: 530 Ohm
Lead Channel Pacing Threshold Amplitude: 0.5 V
Lead Channel Pacing Threshold Amplitude: 0.75 V
Lead Channel Pacing Threshold Amplitude: 1.25 V
Lead Channel Pacing Threshold Pulse Width: 0.5 ms
Lead Channel Pacing Threshold Pulse Width: 0.5 ms
Lead Channel Pacing Threshold Pulse Width: 0.5 ms
Lead Channel Sensing Intrinsic Amplitude: 12 mV
Lead Channel Sensing Intrinsic Amplitude: 4.2 mV
Lead Channel Setting Pacing Amplitude: 1.5 V
Lead Channel Setting Pacing Amplitude: 2 V
Lead Channel Setting Pacing Amplitude: 2.5 V
Lead Channel Setting Pacing Pulse Width: 0.5 ms
Lead Channel Setting Pacing Pulse Width: 0.5 ms
Lead Channel Setting Sensing Sensitivity: 0.5 mV
Pulse Gen Serial Number: 7211486

## 2021-07-24 ENCOUNTER — Ambulatory Visit (INDEPENDENT_AMBULATORY_CARE_PROVIDER_SITE_OTHER): Payer: Medicare Other

## 2021-07-24 DIAGNOSIS — I5022 Chronic systolic (congestive) heart failure: Secondary | ICD-10-CM | POA: Diagnosis not present

## 2021-07-24 DIAGNOSIS — Z95810 Presence of automatic (implantable) cardiac defibrillator: Secondary | ICD-10-CM | POA: Diagnosis not present

## 2021-07-26 NOTE — Progress Notes (Signed)
EPIC Encounter for ICM Monitoring ? ?Patient Name: Walter [name] is a 76 y.o. male ?Date: 07/26/2021 ?Primary Care Physican: London Pepper, MD ?Primary Cardiologist: Aundra Dubin ?Electrophysiologist: Allred ?Bi-V Pacing: 97%         ?05/19/2021 Weight: 243-245 lbs (baseline 240-241 lb) ?  ?Battery ERI: 5.3 months                                        ?  ?Transmission reviewed.  ?  ?CorVue thoracic impedance normal but was suggesting possible fluid accumulation from 4/14-4/26. ?  ?Furosemide 20 mg Take 1 tablet (20 mg total) by mouth every other day.  He reports 10/11/2020, he takes Furosemide PRN instead of every other day. ?   ?Labs:  ?07/10/2020 Creatinine 1.55, BUN 35, Potassium 3.5, Sodium 127, GFR 46 ?04/15/2020 Creatinine 1.12, BUN 11, Potassium 4.0, Sodium 138 ?A complete set of results can be found in Results Review. ?  ?Recommendations:  No changes. ?  ?Follow-up plan: ICM clinic phone appointment on 07/24/2021.  91 day device clinic remote transmission 10/23/2021.   ?  ?EP/Cardiology Office Visits: Recall 10/30/2021 with Dr. Rayann Heman.  Recall 10/12/2020 with Dr Aundra Dubin ?  ?Copy of ICM check sent to Dr. Rayann Heman.   ? ?3 month ICM trend: 07/21/2021. ? ? ? ? ?Rosalene Billings, RN ?07/26/2021 ?1:17 PM ? ?

## 2021-08-02 NOTE — Progress Notes (Signed)
Remote ICD transmission.   

## 2021-08-09 DIAGNOSIS — F39 Unspecified mood [affective] disorder: Secondary | ICD-10-CM | POA: Diagnosis not present

## 2021-08-09 DIAGNOSIS — E291 Testicular hypofunction: Secondary | ICD-10-CM | POA: Diagnosis not present

## 2021-08-09 DIAGNOSIS — R208 Other disturbances of skin sensation: Secondary | ICD-10-CM | POA: Diagnosis not present

## 2021-08-09 DIAGNOSIS — E1169 Type 2 diabetes mellitus with other specified complication: Secondary | ICD-10-CM | POA: Diagnosis not present

## 2021-08-09 DIAGNOSIS — G8929 Other chronic pain: Secondary | ICD-10-CM | POA: Diagnosis not present

## 2021-08-09 DIAGNOSIS — E785 Hyperlipidemia, unspecified: Secondary | ICD-10-CM | POA: Diagnosis not present

## 2021-08-18 DIAGNOSIS — Z79891 Long term (current) use of opiate analgesic: Secondary | ICD-10-CM | POA: Diagnosis not present

## 2021-08-18 DIAGNOSIS — Z79899 Other long term (current) drug therapy: Secondary | ICD-10-CM | POA: Diagnosis not present

## 2021-08-18 DIAGNOSIS — M25569 Pain in unspecified knee: Secondary | ICD-10-CM | POA: Diagnosis not present

## 2021-08-18 DIAGNOSIS — M25559 Pain in unspecified hip: Secondary | ICD-10-CM | POA: Diagnosis not present

## 2021-08-18 DIAGNOSIS — M5459 Other low back pain: Secondary | ICD-10-CM | POA: Diagnosis not present

## 2021-08-18 DIAGNOSIS — M25519 Pain in unspecified shoulder: Secondary | ICD-10-CM | POA: Diagnosis not present

## 2021-08-18 DIAGNOSIS — G894 Chronic pain syndrome: Secondary | ICD-10-CM | POA: Diagnosis not present

## 2021-08-18 DIAGNOSIS — M1990 Unspecified osteoarthritis, unspecified site: Secondary | ICD-10-CM | POA: Diagnosis not present

## 2021-08-18 DIAGNOSIS — M533 Sacrococcygeal disorders, not elsewhere classified: Secondary | ICD-10-CM | POA: Diagnosis not present

## 2021-08-18 DIAGNOSIS — M1710 Unilateral primary osteoarthritis, unspecified knee: Secondary | ICD-10-CM | POA: Diagnosis not present

## 2021-08-18 DIAGNOSIS — M47817 Spondylosis without myelopathy or radiculopathy, lumbosacral region: Secondary | ICD-10-CM | POA: Diagnosis not present

## 2021-08-18 DIAGNOSIS — M47818 Spondylosis without myelopathy or radiculopathy, sacral and sacrococcygeal region: Secondary | ICD-10-CM | POA: Diagnosis not present

## 2021-08-21 ENCOUNTER — Ambulatory Visit (INDEPENDENT_AMBULATORY_CARE_PROVIDER_SITE_OTHER): Payer: Medicare Other

## 2021-08-21 DIAGNOSIS — I428 Other cardiomyopathies: Secondary | ICD-10-CM

## 2021-08-21 LAB — CUP PACEART REMOTE DEVICE CHECK
Battery Remaining Longevity: 4 mo
Battery Remaining Percentage: 4 %
Battery Voltage: 2.62 V
Brady Statistic AP VP Percent: 3.5 %
Brady Statistic AP VS Percent: 1 %
Brady Statistic AS VP Percent: 94 %
Brady Statistic AS VS Percent: 1.8 %
Brady Statistic RA Percent Paced: 3.8 %
Date Time Interrogation Session: 20230604035546
HighPow Impedance: 74 Ohm
HighPow Impedance: 74 Ohm
Implantable Lead Implant Date: 20151119
Implantable Lead Implant Date: 20151119
Implantable Lead Implant Date: 20151119
Implantable Lead Location: 753858
Implantable Lead Location: 753859
Implantable Lead Location: 753860
Implantable Pulse Generator Implant Date: 20151119
Lead Channel Impedance Value: 390 Ohm
Lead Channel Impedance Value: 450 Ohm
Lead Channel Impedance Value: 550 Ohm
Lead Channel Pacing Threshold Amplitude: 0.5 V
Lead Channel Pacing Threshold Amplitude: 0.75 V
Lead Channel Pacing Threshold Amplitude: 1.25 V
Lead Channel Pacing Threshold Pulse Width: 0.5 ms
Lead Channel Pacing Threshold Pulse Width: 0.5 ms
Lead Channel Pacing Threshold Pulse Width: 0.5 ms
Lead Channel Sensing Intrinsic Amplitude: 12 mV
Lead Channel Sensing Intrinsic Amplitude: 2.9 mV
Lead Channel Setting Pacing Amplitude: 1.5 V
Lead Channel Setting Pacing Amplitude: 2 V
Lead Channel Setting Pacing Amplitude: 2.5 V
Lead Channel Setting Pacing Pulse Width: 0.5 ms
Lead Channel Setting Pacing Pulse Width: 0.5 ms
Lead Channel Setting Sensing Sensitivity: 0.5 mV
Pulse Gen Serial Number: 7211486

## 2021-08-28 ENCOUNTER — Telehealth: Payer: Self-pay

## 2021-08-28 ENCOUNTER — Ambulatory Visit (INDEPENDENT_AMBULATORY_CARE_PROVIDER_SITE_OTHER): Payer: Medicare Other

## 2021-08-28 DIAGNOSIS — Z95810 Presence of automatic (implantable) cardiac defibrillator: Secondary | ICD-10-CM | POA: Diagnosis not present

## 2021-08-28 DIAGNOSIS — I5022 Chronic systolic (congestive) heart failure: Secondary | ICD-10-CM

## 2021-08-28 NOTE — Telephone Encounter (Signed)
Remote ICM transmission received.  Attempted call to patient regarding ICM remote transmission and left detailed message per DPR.  Advised to return call for any fluid symptoms or questions. Next ICM remote transmission scheduled 09/04/2021.

## 2021-08-28 NOTE — Progress Notes (Addendum)
Spoke with patient and heart failure questions reviewed.  Pt asymptomatic for fluid accumulation.  He reports itching and burning at the device site and unsure what may be causing it.  He does have a new body spray he uses but not typically in that area and that is the only sensitive area.   Advised to discontinue new body spray to see if that relieves the symptoms and if not call the office to have device clinic nurses check the site.  Transmission reviewed and advised have 4 months left on the battery.  Advised to take Furosemide 20 mg 1 tablet x 2 days.  He typically takes Furosemide PRN.  Will recheck fluid levels 6/19.  Weight is stable.

## 2021-08-28 NOTE — Progress Notes (Signed)
EPIC Encounter for ICM Monitoring  Patient Name: Walter [name] is a 76 y.o. male Date: 08/28/2021 Primary Care Physican: London Pepper, MD Primary Cardiologist: Aundra Dubin Electrophysiologist: Allred Bi-V Pacing: 97%         05/19/2021 Weight: 243-245 lbs (baseline 240-241 lb)   Battery ERI: 4 months                                          Attempted call to patient and unable to reach.  Left detailed message per DPR regarding transmission. Transmission reviewed.    CorVue thoracic impedance suggesting possible fluid accumulation starting 6/4.   Furosemide 20 mg Take 1 tablet (20 mg total) by mouth every other day.  He reports 10/11/2020, he takes Furosemide PRN instead of every other day.    Labs:  07/10/2020 Creatinine 1.55, BUN 35, Potassium 3.5, Sodium 127, GFR 46 04/15/2020 Creatinine 1.12, BUN 11, Potassium 4.0, Sodium 138 A complete set of results can be found in Results Review.   Recommendations:  Left voice mail with ICM number and encouraged to call if experiencing any fluid symptoms.   Follow-up plan: ICM clinic phone appointment on 09/04/2021 to recheck fluid levels.  91 day device clinic remote transmission 10/23/2021.     EP/Cardiology Office Visits: Recall 10/30/2021 with Dr. Rayann Heman.  Recall 10/12/2020 with Dr Aundra Dubin   Copy of ICM check sent to Dr. Rayann Heman.  Will send to Dr Aundra Dubin for review if patient is reached.   3 month ICM trend: 08/28/2021.    12-14 Month ICM trend:     Rosalene Billings, RN 08/28/2021 4:09 PM

## 2021-09-04 ENCOUNTER — Ambulatory Visit (INDEPENDENT_AMBULATORY_CARE_PROVIDER_SITE_OTHER): Payer: Medicare Other

## 2021-09-04 DIAGNOSIS — Z95810 Presence of automatic (implantable) cardiac defibrillator: Secondary | ICD-10-CM

## 2021-09-04 DIAGNOSIS — I5022 Chronic systolic (congestive) heart failure: Secondary | ICD-10-CM

## 2021-09-05 NOTE — Progress Notes (Signed)
EPIC Encounter for ICM Monitoring  Patient Name: Walter [name] is a 76 y.o. male Date: 09/05/2021 Primary Care Physican: London Pepper, MD Primary Cardiologist: Aundra Dubin Electrophysiologist: Allred Bi-V Pacing: 97%         09/05/2021 Weight: 241 lbs (baseline 240-241 lb)   Battery ERI: 4.1 months                                          Spoke with patient and heart failure questions reviewed.  Pt asymptomatic for fluid accumulation.  Reports feeling well at this time and voices no complaints.  Wife cooks with salt.    CorVue thoracic impedance suggesting fluid levels returned to normal after taking PRN Furosemide.   Furosemide 20 mg Take 1 tablet (20 mg total) by mouth every other day.  He takes Furosemide PRN instead of every other day.    Labs:  07/10/2020 Creatinine 1.55, BUN 35, Potassium 3.5, Sodium 127, GFR 46 04/15/2020 Creatinine 1.12, BUN 11, Potassium 4.0, Sodium 138 A complete set of results can be found in Results Review.   Recommendations:  No changes and encouraged to call if experiencing any fluid symptoms.   Follow-up plan: ICM clinic phone appointment on 10/02/2021.  91 day device clinic remote transmission 10/23/2021.     EP/Cardiology Office Visits: Recall 10/30/2021 with Dr. Rayann Heman.  Recall 10/12/2020 with Dr Aundra Dubin   Copy of ICM check sent to Dr. Rayann Heman.   3 month ICM trend: 09/04/2021.    12-14 Month ICM trend:     Rosalene Billings, RN 09/05/2021 4:04 PM

## 2021-09-07 NOTE — Progress Notes (Signed)
Remote ICD transmission.   

## 2021-09-15 IMAGING — DX DG CHEST 2V
2 series · 2 of 2 positions shown · non-contrast
Comparison: Chest x-ray 02/05/2014.

CLINICAL DATA: 74-year-old male with history of cough. Prior
history of asbestos exposure.

EXAM:
CHEST - 2 VIEW

[dg chest 2 view (1 of 2)]
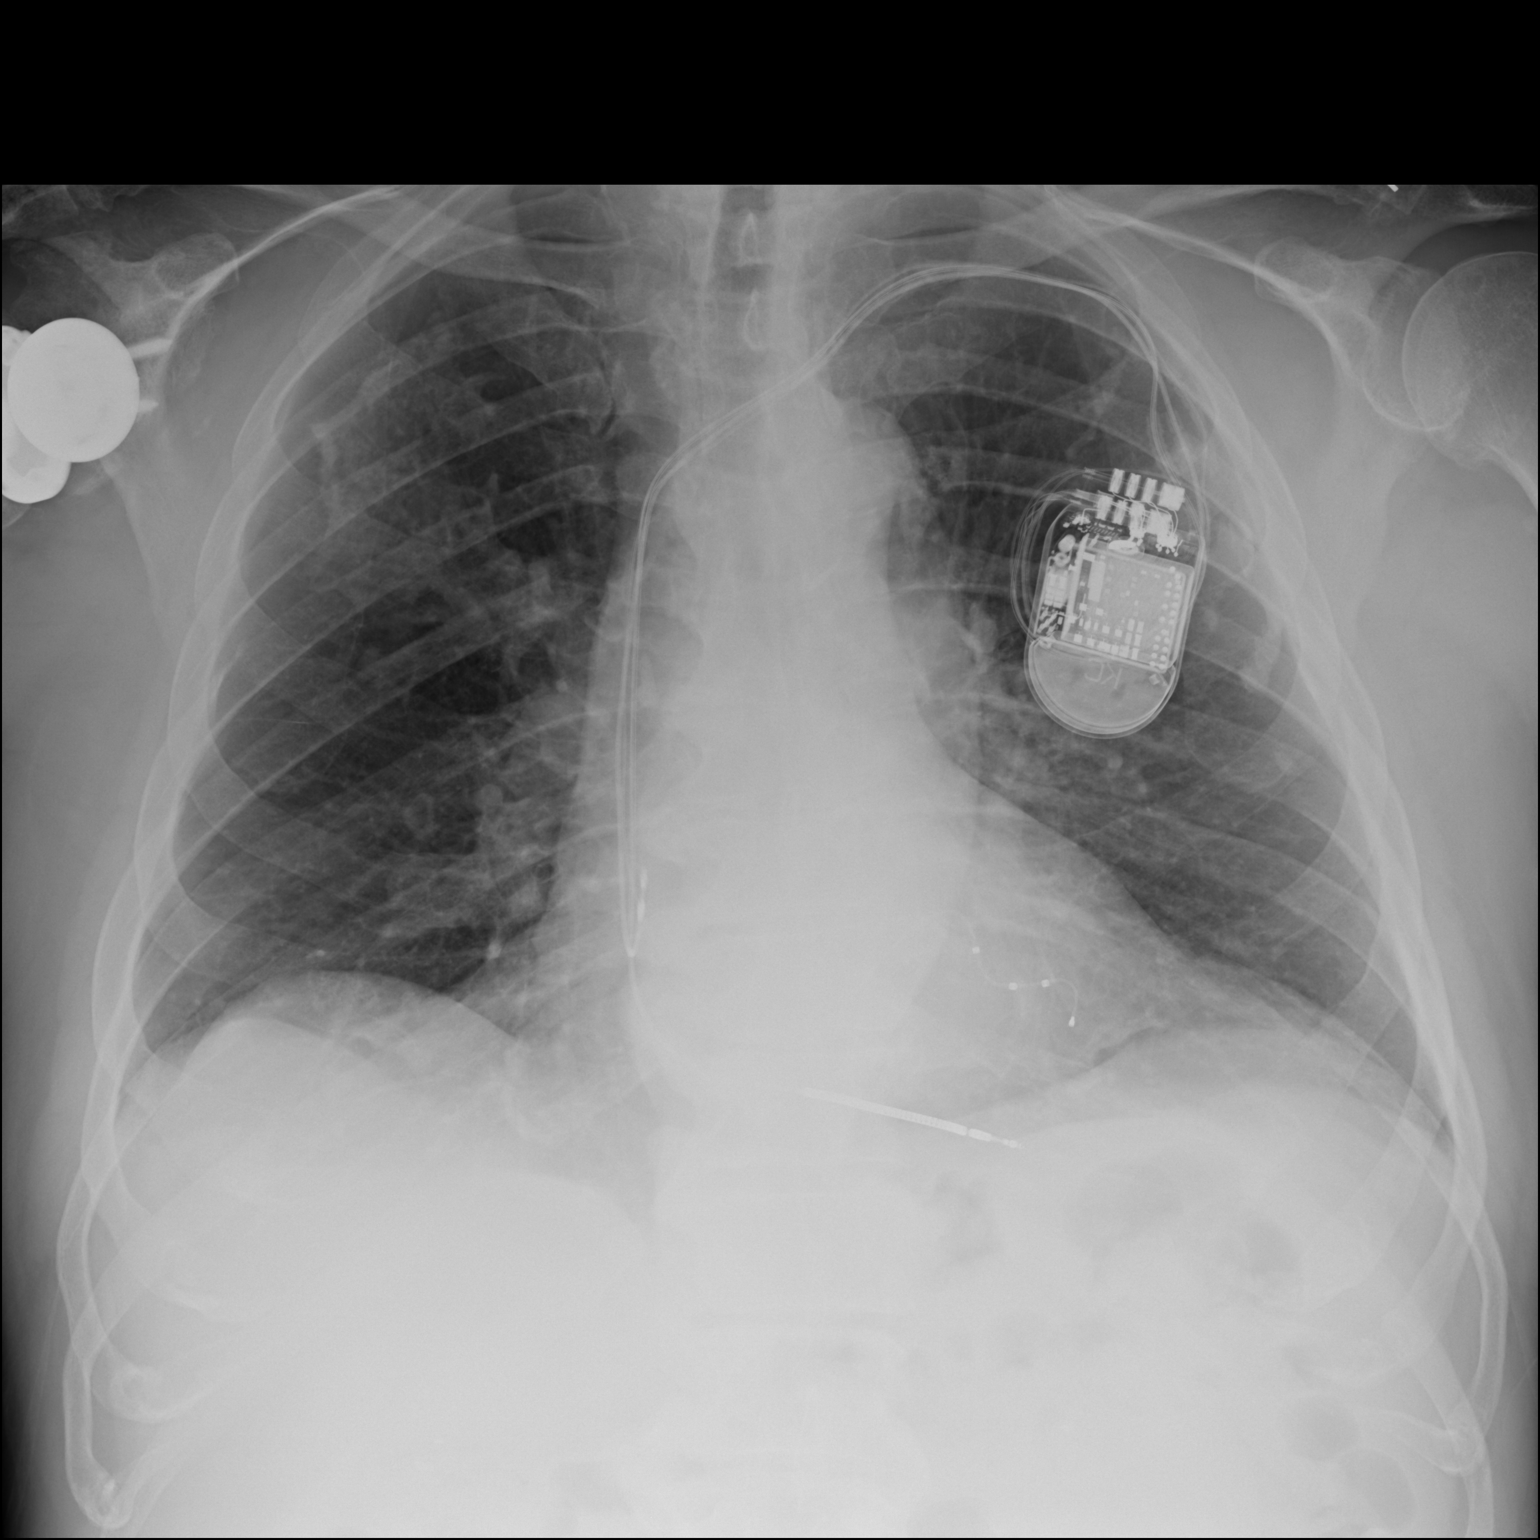

[dg chest 2 view (2 of 2)]
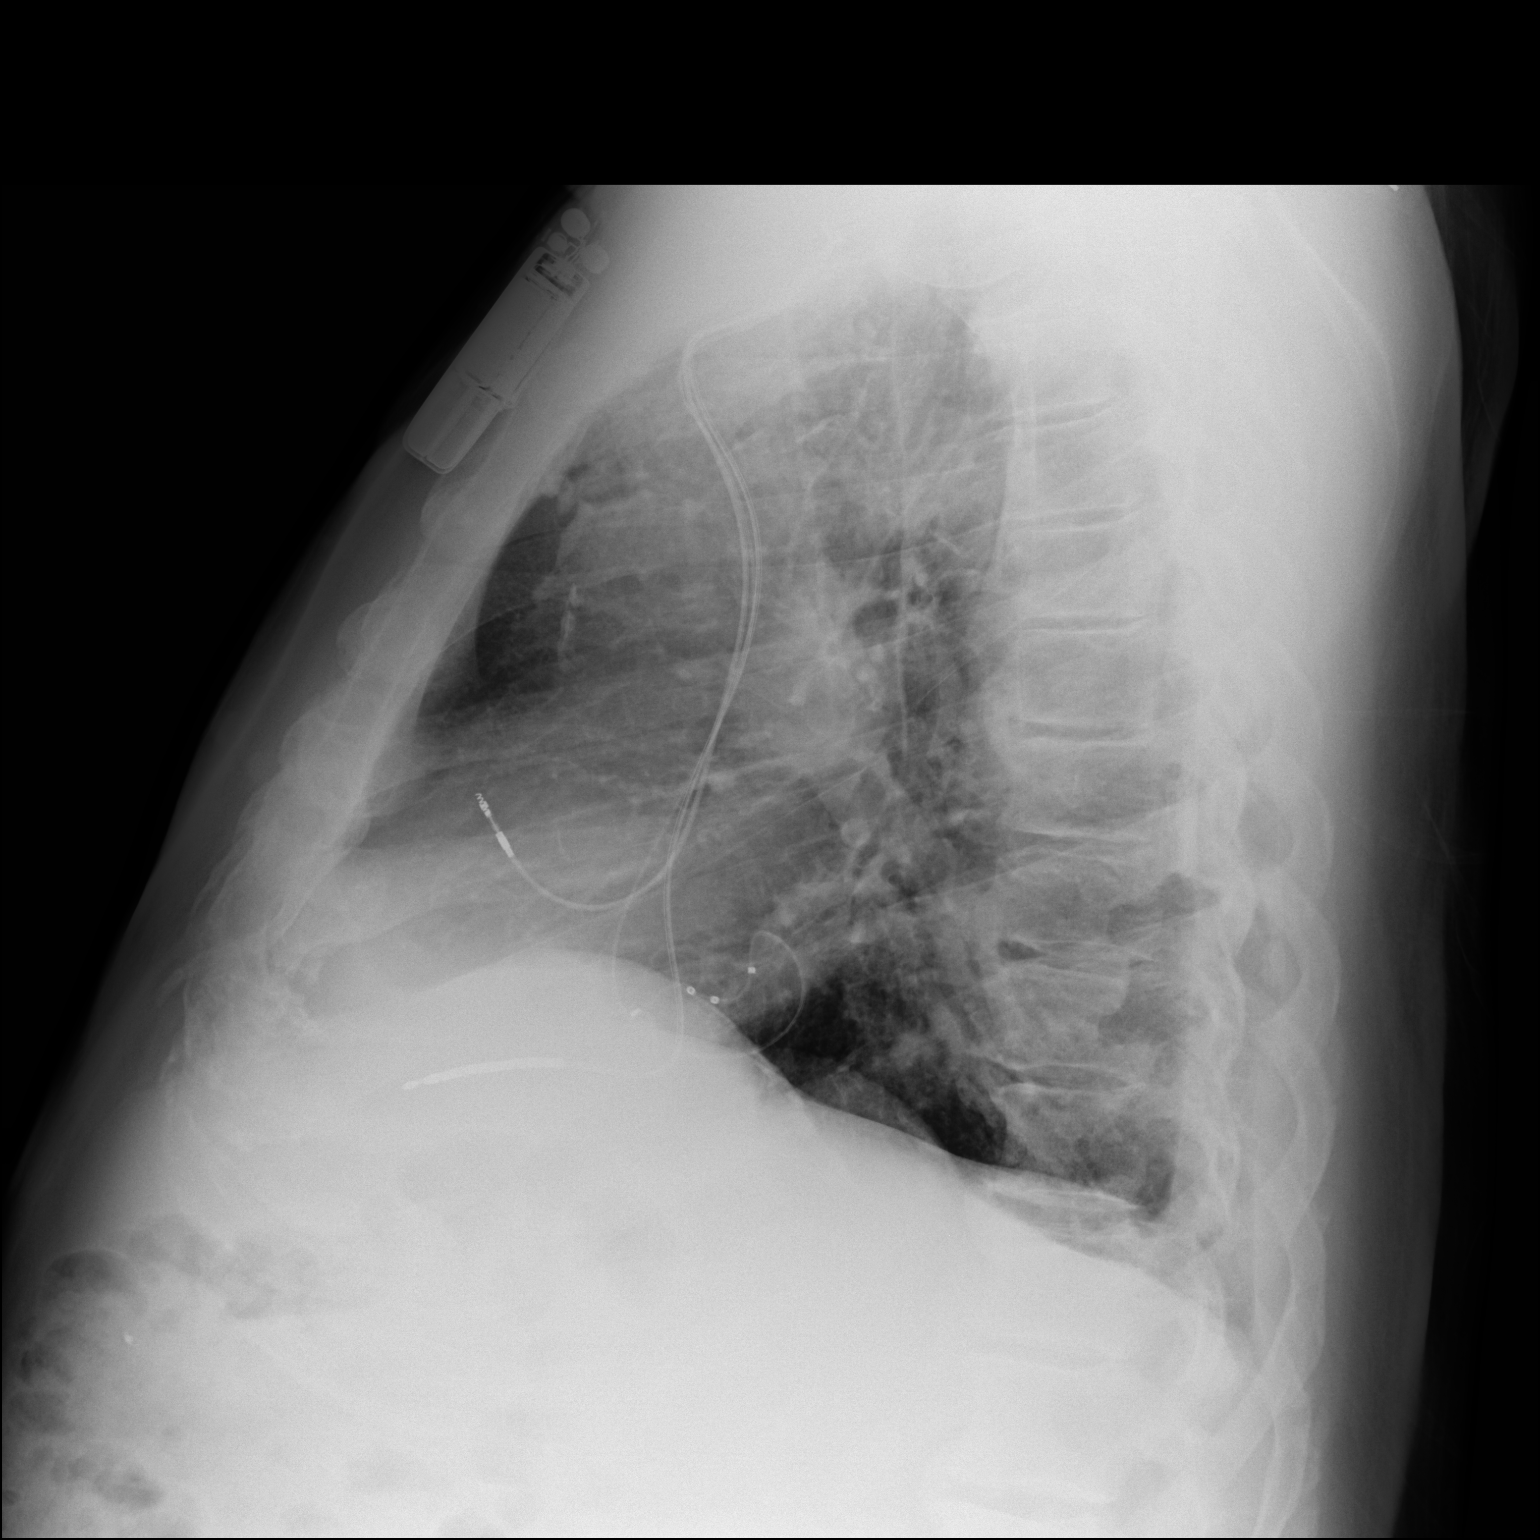

[2 of 2 positions shown; findings below may reference images not displayed]

FINDINGS: Calcified pleural plaques are again noted in the thorax bilaterally.
Lung volumes are normal. No consolidative airspace disease. No
pleural effusions. No pneumothorax. No pulmonary nodule or mass
noted. Pulmonary vasculature and the cardiomediastinal silhouette
are within normal limits. Atherosclerosis in the thoracic aorta.
Left-sided biventricular pacemaker/AICD with lead tips projecting
over the expected location of the right atrium, right ventricle and
lateral wall of the left ventricle via the coronary sinus and
coronary veins. Status post right shoulder arthroplasty.
IMPRESSION: 1. Calcified pleural plaques in the thorax bilaterally, compatible
with reported clinical history of scratch the indicative of asbestos
related pleural disease.
2. Aortic atherosclerosis.

## 2021-09-20 ENCOUNTER — Other Ambulatory Visit (HOSPITAL_COMMUNITY): Payer: Self-pay | Admitting: Cardiology

## 2021-09-20 ENCOUNTER — Encounter (HOSPITAL_COMMUNITY): Payer: Self-pay | Admitting: Cardiology

## 2021-09-20 MED ORDER — FUROSEMIDE 20 MG PO TABS: 20.0000 mg | ORAL_TABLET | ORAL | 3 refills | Status: DC

## 2021-09-21 ENCOUNTER — Ambulatory Visit (INDEPENDENT_AMBULATORY_CARE_PROVIDER_SITE_OTHER): Payer: Medicare Other

## 2021-09-21 DIAGNOSIS — I428 Other cardiomyopathies: Secondary | ICD-10-CM

## 2021-09-21 LAB — CUP PACEART REMOTE DEVICE CHECK
Battery Remaining Longevity: 1 mo
Battery Remaining Percentage: 3 %
Battery Voltage: 2.6 V
Brady Statistic AP VP Percent: 3.5 %
Brady Statistic AP VS Percent: 1 %
Brady Statistic AS VP Percent: 94 %
Brady Statistic AS VS Percent: 1.9 %
Brady Statistic RA Percent Paced: 3.8 %
Date Time Interrogation Session: 20230706041229
HighPow Impedance: 83 Ohm
HighPow Impedance: 83 Ohm
Implantable Lead Implant Date: 20151119
Implantable Lead Implant Date: 20151119
Implantable Lead Implant Date: 20151119
Implantable Lead Location: 753858
Implantable Lead Location: 753859
Implantable Lead Location: 753860
Implantable Pulse Generator Implant Date: 20151119
Lead Channel Impedance Value: 390 Ohm
Lead Channel Impedance Value: 450 Ohm
Lead Channel Impedance Value: 590 Ohm
Lead Channel Pacing Threshold Amplitude: 0.5 V
Lead Channel Pacing Threshold Amplitude: 0.75 V
Lead Channel Pacing Threshold Amplitude: 1.25 V
Lead Channel Pacing Threshold Pulse Width: 0.5 ms
Lead Channel Pacing Threshold Pulse Width: 0.5 ms
Lead Channel Pacing Threshold Pulse Width: 0.5 ms
Lead Channel Sensing Intrinsic Amplitude: 12 mV
Lead Channel Sensing Intrinsic Amplitude: 2.9 mV
Lead Channel Setting Pacing Amplitude: 1.5 V
Lead Channel Setting Pacing Amplitude: 2 V
Lead Channel Setting Pacing Amplitude: 2.5 V
Lead Channel Setting Pacing Pulse Width: 0.5 ms
Lead Channel Setting Pacing Pulse Width: 0.5 ms
Lead Channel Setting Sensing Sensitivity: 0.5 mV
Pulse Gen Serial Number: 7211486

## 2021-09-22 DIAGNOSIS — M1710 Unilateral primary osteoarthritis, unspecified knee: Secondary | ICD-10-CM | POA: Diagnosis not present

## 2021-09-22 DIAGNOSIS — Z79891 Long term (current) use of opiate analgesic: Secondary | ICD-10-CM | POA: Diagnosis not present

## 2021-09-22 DIAGNOSIS — M1990 Unspecified osteoarthritis, unspecified site: Secondary | ICD-10-CM | POA: Diagnosis not present

## 2021-09-22 DIAGNOSIS — M533 Sacrococcygeal disorders, not elsewhere classified: Secondary | ICD-10-CM | POA: Diagnosis not present

## 2021-09-22 DIAGNOSIS — M25559 Pain in unspecified hip: Secondary | ICD-10-CM | POA: Diagnosis not present

## 2021-09-22 DIAGNOSIS — M25519 Pain in unspecified shoulder: Secondary | ICD-10-CM | POA: Diagnosis not present

## 2021-09-22 DIAGNOSIS — M25569 Pain in unspecified knee: Secondary | ICD-10-CM | POA: Diagnosis not present

## 2021-09-22 DIAGNOSIS — M47818 Spondylosis without myelopathy or radiculopathy, sacral and sacrococcygeal region: Secondary | ICD-10-CM | POA: Diagnosis not present

## 2021-09-22 DIAGNOSIS — G894 Chronic pain syndrome: Secondary | ICD-10-CM | POA: Diagnosis not present

## 2021-09-22 DIAGNOSIS — M5459 Other low back pain: Secondary | ICD-10-CM | POA: Diagnosis not present

## 2021-09-22 DIAGNOSIS — M47817 Spondylosis without myelopathy or radiculopathy, lumbosacral region: Secondary | ICD-10-CM | POA: Diagnosis not present

## 2021-09-22 DIAGNOSIS — Z79899 Other long term (current) drug therapy: Secondary | ICD-10-CM | POA: Diagnosis not present

## 2021-10-02 ENCOUNTER — Ambulatory Visit (INDEPENDENT_AMBULATORY_CARE_PROVIDER_SITE_OTHER): Payer: Medicare Other

## 2021-10-02 DIAGNOSIS — I5022 Chronic systolic (congestive) heart failure: Secondary | ICD-10-CM

## 2021-10-02 DIAGNOSIS — Z95810 Presence of automatic (implantable) cardiac defibrillator: Secondary | ICD-10-CM | POA: Diagnosis not present

## 2021-10-04 NOTE — Progress Notes (Signed)
EPIC Encounter for ICM Monitoring  Patient Name: Walter [name] is a 76 y.o. male Date: 10/04/2021 Primary Care Physican: London Pepper, MD Primary Cardiologist: Aundra Dubin Electrophysiologist: Allred Bi-V Pacing: 97%         09/05/2021 Weight: 241 lbs (baseline 240-241 lb) 10/04/2021 Weight: 242 lbs   Battery ERI: <3 months                                          Spoke with patient and heart failure questions reviewed.  Pt asymptomatic for fluid accumulation.  Reports feeling well at this time and voices no complaints.     CorVue thoracic impedance suggesting normal fluid levels with exception of possible fluid accumulation 7/12-7/15.   Furosemide 20 mg Take 1 tablet (20 mg total) by mouth every other day.  He takes Furosemide PRN instead of every other day.    Labs:  07/10/2020 Creatinine 1.55, BUN 35, Potassium 3.5, Sodium 127, GFR 46 04/15/2020 Creatinine 1.12, BUN 11, Potassium 4.0, Sodium 138 A complete set of results can be found in Results Review.   Recommendations:  No changes and encouraged to call if experiencing any fluid symptoms.   Follow-up plan: ICM clinic phone appointment on 11/06/2021.  91 day device clinic remote transmission 10/23/2021.     EP/Cardiology Office Visits: Recall 10/30/2021 with Dr. Rayann Heman.  11/17/2021 with Dr Aundra Dubin.   Copy of ICM check sent to Dr. Rayann Heman.   3 month ICM trend: 10/02/2021.    12-14 Month ICM trend:     Rosalene Billings, RN 10/04/2021 5:12 PM

## 2021-10-05 DIAGNOSIS — E785 Hyperlipidemia, unspecified: Secondary | ICD-10-CM | POA: Diagnosis not present

## 2021-10-05 DIAGNOSIS — Z0000 Encounter for general adult medical examination without abnormal findings: Secondary | ICD-10-CM | POA: Diagnosis not present

## 2021-10-05 DIAGNOSIS — I10 Essential (primary) hypertension: Secondary | ICD-10-CM | POA: Diagnosis not present

## 2021-10-05 DIAGNOSIS — E1169 Type 2 diabetes mellitus with other specified complication: Secondary | ICD-10-CM | POA: Diagnosis not present

## 2021-10-05 DIAGNOSIS — G8929 Other chronic pain: Secondary | ICD-10-CM | POA: Diagnosis not present

## 2021-10-05 DIAGNOSIS — E291 Testicular hypofunction: Secondary | ICD-10-CM | POA: Diagnosis not present

## 2021-10-05 DIAGNOSIS — I700 Atherosclerosis of aorta: Secondary | ICD-10-CM | POA: Diagnosis not present

## 2021-10-05 DIAGNOSIS — N529 Male erectile dysfunction, unspecified: Secondary | ICD-10-CM | POA: Diagnosis not present

## 2021-10-05 DIAGNOSIS — Z96642 Presence of left artificial hip joint: Secondary | ICD-10-CM | POA: Diagnosis not present

## 2021-10-05 DIAGNOSIS — Z792 Long term (current) use of antibiotics: Secondary | ICD-10-CM | POA: Diagnosis not present

## 2021-10-05 DIAGNOSIS — I4891 Unspecified atrial fibrillation: Secondary | ICD-10-CM | POA: Diagnosis not present

## 2021-10-05 DIAGNOSIS — F39 Unspecified mood [affective] disorder: Secondary | ICD-10-CM | POA: Diagnosis not present

## 2021-10-10 NOTE — Progress Notes (Signed)
Remote ICD transmission.   

## 2021-10-23 ENCOUNTER — Ambulatory Visit (INDEPENDENT_AMBULATORY_CARE_PROVIDER_SITE_OTHER): Payer: Medicare Other

## 2021-10-23 DIAGNOSIS — I428 Other cardiomyopathies: Secondary | ICD-10-CM

## 2021-10-23 DIAGNOSIS — I5022 Chronic systolic (congestive) heart failure: Secondary | ICD-10-CM | POA: Diagnosis not present

## 2021-10-24 LAB — CUP PACEART REMOTE DEVICE CHECK
Battery Remaining Longevity: 1 mo
Battery Remaining Percentage: 1 %
Battery Voltage: 2.6 V
Brady Statistic AP VP Percent: 3.7 %
Brady Statistic AP VS Percent: 1 %
Brady Statistic AS VP Percent: 94 %
Brady Statistic AS VS Percent: 2 %
Brady Statistic RA Percent Paced: 4 %
Date Time Interrogation Session: 20230807020017
HighPow Impedance: 77 Ohm
HighPow Impedance: 77 Ohm
Implantable Lead Implant Date: 20151119
Implantable Lead Implant Date: 20151119
Implantable Lead Implant Date: 20151119
Implantable Lead Location: 753858
Implantable Lead Location: 753859
Implantable Lead Location: 753860
Implantable Pulse Generator Implant Date: 20151119
Lead Channel Impedance Value: 410 Ohm
Lead Channel Impedance Value: 460 Ohm
Lead Channel Impedance Value: 550 Ohm
Lead Channel Pacing Threshold Amplitude: 0.5 V
Lead Channel Pacing Threshold Amplitude: 0.75 V
Lead Channel Pacing Threshold Amplitude: 1.25 V
Lead Channel Pacing Threshold Pulse Width: 0.5 ms
Lead Channel Pacing Threshold Pulse Width: 0.5 ms
Lead Channel Pacing Threshold Pulse Width: 0.5 ms
Lead Channel Sensing Intrinsic Amplitude: 12 mV
Lead Channel Sensing Intrinsic Amplitude: 2.9 mV
Lead Channel Setting Pacing Amplitude: 1.5 V
Lead Channel Setting Pacing Amplitude: 2 V
Lead Channel Setting Pacing Amplitude: 2.5 V
Lead Channel Setting Pacing Pulse Width: 0.5 ms
Lead Channel Setting Pacing Pulse Width: 0.5 ms
Lead Channel Setting Sensing Sensitivity: 0.5 mV
Pulse Gen Serial Number: 7211486

## 2021-10-25 ENCOUNTER — Other Ambulatory Visit (HOSPITAL_COMMUNITY): Payer: Self-pay | Admitting: Cardiology

## 2021-10-26 DIAGNOSIS — M5459 Other low back pain: Secondary | ICD-10-CM | POA: Diagnosis not present

## 2021-10-26 DIAGNOSIS — M1990 Unspecified osteoarthritis, unspecified site: Secondary | ICD-10-CM | POA: Diagnosis not present

## 2021-10-26 DIAGNOSIS — Z79899 Other long term (current) drug therapy: Secondary | ICD-10-CM | POA: Diagnosis not present

## 2021-10-26 DIAGNOSIS — M47817 Spondylosis without myelopathy or radiculopathy, lumbosacral region: Secondary | ICD-10-CM | POA: Diagnosis not present

## 2021-10-26 DIAGNOSIS — M25559 Pain in unspecified hip: Secondary | ICD-10-CM | POA: Diagnosis not present

## 2021-10-26 DIAGNOSIS — M533 Sacrococcygeal disorders, not elsewhere classified: Secondary | ICD-10-CM | POA: Diagnosis not present

## 2021-10-26 DIAGNOSIS — M25569 Pain in unspecified knee: Secondary | ICD-10-CM | POA: Diagnosis not present

## 2021-10-26 DIAGNOSIS — M47818 Spondylosis without myelopathy or radiculopathy, sacral and sacrococcygeal region: Secondary | ICD-10-CM | POA: Diagnosis not present

## 2021-10-26 DIAGNOSIS — M25519 Pain in unspecified shoulder: Secondary | ICD-10-CM | POA: Diagnosis not present

## 2021-10-26 DIAGNOSIS — M1710 Unilateral primary osteoarthritis, unspecified knee: Secondary | ICD-10-CM | POA: Diagnosis not present

## 2021-10-26 DIAGNOSIS — Z79891 Long term (current) use of opiate analgesic: Secondary | ICD-10-CM | POA: Diagnosis not present

## 2021-10-26 DIAGNOSIS — G894 Chronic pain syndrome: Secondary | ICD-10-CM | POA: Diagnosis not present

## 2021-10-30 ENCOUNTER — Telehealth: Payer: Self-pay

## 2021-10-30 NOTE — Telephone Encounter (Signed)
ICD at [facility] 8/12  LVM on cell phone as well as attempted home phone, JA patient needs apt with EP APP to discuss ERI.

## 2021-10-30 NOTE — Telephone Encounter (Signed)
Spoke with patient informed him his ICD has reached ERI patient agreeable o apt with R. Ursuy on 11/10/21 to discuss generator change out, patient would like Dr. Quentin Ore to do gen change message sent to [facility] and Dr. Quentin Ore

## 2021-10-30 NOTE — Telephone Encounter (Signed)
Pt left a message returning nurse call. 

## 2021-11-06 ENCOUNTER — Ambulatory Visit (INDEPENDENT_AMBULATORY_CARE_PROVIDER_SITE_OTHER): Payer: Medicare Other

## 2021-11-06 DIAGNOSIS — Z95810 Presence of automatic (implantable) cardiac defibrillator: Secondary | ICD-10-CM | POA: Diagnosis not present

## 2021-11-06 DIAGNOSIS — I5022 Chronic systolic (congestive) heart failure: Secondary | ICD-10-CM

## 2021-11-07 NOTE — Progress Notes (Signed)
EPIC Encounter for ICM Monitoring  Patient Name: Walter [name] is a 76 y.o. male Date: 11/07/2021 Primary Care Physican: London Pepper, MD Primary Cardiologist: Aundra Dubin Electrophysiologist: Allred Bi-V Pacing: 97%         09/05/2021 Weight: 241 lbs (baseline 240-241 lb) 10/04/2021 Weight: 242 lbs   Battery ERI reached 8/12                                        Spoke with patient and heart failure questions reviewed.  Pt reports he has some swelling in ankles and would like to send updated CorVue report 8/23.      CorVue thoracic impedance suggesting normal fluid levels.   Furosemide 20 mg Take 1 tablet (20 mg total) by mouth every other day.  He takes Furosemide PRN instead of every other day.    Labs:  07/10/2020 Creatinine 1.55, BUN 35, Potassium 3.5, Sodium 127, GFR 46 04/15/2020 Creatinine 1.12, BUN 11, Potassium 4.0, Sodium 138 A complete set of results can be found in Results Review.   Recommendations:  Pt will send updated report 8/23 to review current fluid levels.     Follow-up plan: ICM clinic phone appointment on 12/11/2021.  91 day device clinic remote transmission 01/25/2022.     EP/Cardiology Office Visits:   11/10/2021 with Tommye Standard, PA to discuss battery replacement.    11/17/2021 with Dr Aundra Dubin.   Copy of ICM check sent to Dr. Rayann Heman.   3 month ICM trend: 11/03/2021.    Rosalene Billings, RN 11/07/2021 4:18 PM

## 2021-11-09 NOTE — H&P (View-Only) (Signed)
Cardiology Office Note Date:  11/09/2021  Patient ID:  Walter, [name] June 15, 1945, MRN [id number] PCP:  London Pepper, MD  Cardiologist:  Dr. Aundra Dubin  Electrophysiologist: Dr. Rayann Heman >> Dr. Quentin Ore   Chief Complaint:  ERI  History of Present Illness: Walter [name] is a 76 y.o. male with history of HTN, HLD, factor V Leiden + (has never seen or been recommended to see a specialist), thrombocytosis (2/2 his testosterone  taking this after his orchiectomy for testicular cancer), LBBB, NICM, DM, essential tremor, ICD, AFib.  He last saw Dr. Aundra Dubin  Jan 2022, LVEF had recovered by the last couple echos over the last few years. Discussed tremor continued despite reduction of amio dose, historically poorly tolerated AFib (RVR) and with hesitation stopped his amio.  He last saw Dr. Rayann Heman 10/24/2020, was doing well, tremor better off amiodarone.  Discussed possibly Tikosyn or ablation if AF burden increased.   ICM clinic RN follows him  TODAY He is accompanied by his wife Doing well, no CP, palpitations or cardiac awareness He has ome mild DOE that is his baseline for years and unchanged No dizzy spells, near syncope or syncope. No bleeding or signs of bleeding   Device information Abbott CRT-D implanted 02/04/2014  AFib/AAD hx Amiodarone stopped Jan 2022  2/2 tremor NOTE: there is mention historically that they were unble to pass TEE probe.  Past Medical History:  Diagnosis Date   AICD (automatic cardioverter/defibrillator) present    Anxiety    Asbestosis ([place])    mild   Atrial fibrillation (HCC)    Chronic low back pain    /sciatica- Dr Letta Median ([facility])   Complication of anesthesia 1996   previous surgery had a run of v-tach (for gallbladder-1990's)-cannot lie completely flat and panis when oxygen mask placed over face   Coronary artery disease, non-occlusive June 2011   Cardiac cath in [facility] following abnormal Myoview   Delirium 05/06/2019    DJD (degenerative joint disease)    /osteoarthritis   Dyslipidemia, goal LDL below 100    On lovastatin (myalgias with Zocor and Lipitor)   Dyspnea    worse in hot weather   Dysrhythmia    LBBB, in 2015-atrial fibrillation   Essential hypertension    Factor V Leiden [facility])    sees Dr. Cherrie Gauze Marrow- no problems with this   Frozen shoulder    right   GERD (gastroesophageal reflux disease)    H/O asbestosis    when in [employer]-1964-1970,1971-1986 in [employer]-diagnosed in 1990   History of kidney stones    Hypogonadism male    Irritable bowel    Kidney stone    Left bundle branch block (LBBB) on electrocardiogram    Diagnosed close to 20 years ago   Myocardial infarction [facility])    Non-ischemic cardiomyopathy ([place]) June 2011   EF 45-50%; suspect related to LBBB; repeat Echo March 2015: EF 50- 55%   Obesity    PONV (postoperative nausea and vomiting)    Restless leg syndrome    Rotator cuff arthropathy of right shoulder 05/14/2017   Sleep apnea    Testicular cancer ([place]) 1972   left   Tremor, essential 12/09/2015   Type 2 diabetes mellitus without complication ([place]) 09/6544   Type 2.    Wears glasses     Past Surgical History:  Procedure Laterality Date   APPENDECTOMY N/A    BI-VENTRICULAR IMPLANTABLE CARDIOVERTER DEFIBRILLATOR N/A 02/04/2014   SJM Quadra [employer]ura BiV ICD  implanted for primary prevention by [name]   CARDIAC CATHETERIZATION  June 2011   Nonobstructive CAD   CARDIAC DEFIBRILLATOR PLACEMENT  02/04/14   [facility]. [facility] [place] model [place] 3365-40Q (serial  Number [id number]) biventricular ICD.   CARDIOVERSION N/A 10/13/2013   Procedure: CARDIOVERSION;  Surgeon: Larey Dresser, MD;  Location: [place];  Service: Cardiovascular;  Laterality: N/A;   CARDIOVERSION  10-16-2013   DCCV by [name] following intracardiac echo to rule out LAA thrombus   CARDIOVERSION N/A 10/16/2013   Procedure: CARDIOVERSION;  Surgeon: Coralyn Mark, MD;  Location: [facility] CATH LAB;   Service: Cardiovascular;  Laterality: N/A;   CHOLECYSTECTOMY OPEN     COLONOSCOPY WITH PROPOFOL N/A 12/04/2019   Procedure: COLONOSCOPY WITH PROPOFOL;  Surgeon: Wonda Horner, MD;  Location: [facility] ENDOSCOPY;  Service: Endoscopy;  Laterality: N/A;   ELECTROPHYSIOLOGY STUDY N/A 10/16/2013   Procedure: ELECTROPHYSIOLOGY STUDY;  Surgeon: Coralyn Mark, MD;  Location: [place] CATH LAB;  Service: Cardiovascular;  Laterality: N/A;   ESOPHAGOGASTRODUODENOSCOPY N/A 10/15/2013   Procedure: ESOPHAGOGASTRODUODENOSCOPY (EGD);  Surgeon: Gatha Mayer, MD;  Location: [facility] ENDOSCOPY;  Service: Endoscopy;  Laterality: N/A;  bedside   HERNIA REPAIR     KNEE ARTHROSCOPY Left    LEFT AND RIGHT HEART CATHETERIZATION WITH CORONARY ANGIOGRAM N/A 10/12/2013   Procedure: LEFT AND RIGHT HEART CATHETERIZATION WITH CORONARY ANGIOGRAM;  Surgeon: Troy Sine, MD;  Location: [facility] CATH LAB;  Service: Cardiovascular;  Laterality: N/A;   lymph nodes     RADIOFREQUENCY ABLATION NERVES     Back every 6 months   REVERSE SHOULDER ARTHROPLASTY Right 05/14/2017   Procedure: TOTAL REVERSE SHOULDER ARTHROPLASTY;  Surgeon: Marchia Bond, MD;  Location: [place];  Service: Orthopedics;  Laterality: Right;   ROTATOR CUFF REPAIR Right    SURGERY SCROTAL / TESTICULAR Left    TONSILLECTOMY Bilateral    TOTAL HIP ARTHROPLASTY Right 02/22/2015   Procedure: RIGHT TOTAL HIP ARTHROPLASTY ANTERIOR APPROACH;  Surgeon: Renette Butters, MD;  Location: [place];  Service: Orthopedics;  Laterality: Right;   TOTAL HIP ARTHROPLASTY Left 05/26/2019   Procedure: TOTAL HIP ARTHROPLASTY;  Surgeon: Marchia Bond, MD;  Location: [facility];  Service: Orthopedics;  Laterality: Left;   TRANSTHORACIC ECHOCARDIOGRAM  May 2011   EF 45% with diffuse hypokinesis; septal bounce from LBBB   TRANSTHORACIC ECHOCARDIOGRAM  March 2015   EF 50-55%. Mild concentric LVH. Septal dyssynergy from LBBB     Current Outpatient Medications  Medication Sig Dispense Refill   albuterol (PROVENTIL  HFA;VENTOLIN HFA) 108 (90 Base) MCG/ACT inhaler Inhale 1-2 puffs into the lungs every 6 (six) hours as needed for wheezing or shortness of breath.     ALTOPREV 40 MG 24 hr tablet Take 40 mg by mouth in the morning and at bedtime.     carvedilol (COREG) 12.5 MG tablet TAKE 1 TABLET TWICE A DAY 180 tablet 3   cholecalciferol (VITAMIN D) 25 MCG (1000 UNIT) tablet Take 1,000 Units by mouth daily.     Cyanocobalamin (VITAMIN B-12) 2500 MCG SUBL Place 2,500 mcg under the tongue daily.     ELIQUIS 5 MG TABS tablet TAKE 1 TABLET TWICE A DAY (NEED FOLLOW UP APPOINTMENT FOR ANY MORE REFILLS) 180 tablet 3   empagliflozin (JARDIANCE) 10 MG TABS tablet Take 10 mg by mouth daily.     esomeprazole (NEXIUM) 40 MG capsule Take 40 mg by mouth daily.     fentaNYL (DURAGESIC) 25 MCG/HR 1 patch every 3 (  three) days.     furosemide (LASIX) 20 MG tablet Take 1 tablet (20 mg total) by mouth every other day. 45 tablet 3   gabapentin (NEURONTIN) 300 MG capsule Take 300-600 mg by mouth daily as needed (pain).      lisinopril (ZESTRIL) 10 MG tablet TAKE 1 TABLET EVERY EVENING 90 tablet 3   melatonin 5 MG TABS Take 10-15 mg by mouth at bedtime as needed (Sleep).      metFORMIN (GLUCOPHAGE) 500 MG tablet Take 1,000 mg by mouth 2 (two) times daily with a meal.      metFORMIN (GLUCOPHAGE-XR) 500 MG 24 hr tablet Take 1,000 mg by mouth 2 (two) times daily.     morphine (MSIR) 15 MG tablet Take 15 mg by mouth 4 (four) times daily.     omega-3 acid ethyl esters (LOVAZA) 1 G capsule Take 1 g by mouth 2 (two) times daily.      ondansetron (ZOFRAN ODT) 4 MG disintegrating tablet Take 1 tablet (4 mg total) by mouth every 8 (eight) hours as needed for nausea or vomiting. 6 tablet 0   oxyCODONE-acetaminophen (PERCOCET) 10-325 MG tablet Take 1 tablet by mouth as needed.     pantoprazole (PROTONIX) 40 MG tablet Take 40 mg by mouth daily.     rOPINIRole (REQUIP) 1 MG tablet Take 1 mg by mouth at bedtime.      sildenafil (VIAGRA) 100 MG  tablet Take 100 mg by mouth daily as needed for erectile dysfunction.     spironolactone (ALDACTONE) 25 MG tablet TAKE 1 TABLET EVERY MORNING (NEED FOLLOW UP APPOINTMENT FOR ANYMORE REFILLS) 90 tablet 3   tadalafil (CIALIS) 10 MG tablet Take 1 tablet by mouth as needed for erectile dysfunction.     testosterone cypionate (DEPOTESTOSTERONE CYPIONATE) 200 MG/ML injection Inject 200 mg into the muscle See admin instructions. into the muscle every 10 days     tiZANidine (ZANAFLEX) 4 MG tablet Take 4 mg by mouth every 6 (six) hours as needed for muscle spasms.     No current facility-administered medications for this visit.    Allergies:   Statins and Cortisone   Social History:  The patient  reports that he quit smoking about 48 years ago. His smoking use included cigarettes. He has never used smokeless tobacco. He reports current alcohol use. He reports that he does not use drugs.   Family History:  The patient's family history includes Heart attack in his maternal uncle; Heart disease in his father, mother, and paternal uncle; Hypertension in his brother, brother, and mother.  ROS:  Please see the history of present illness.    All other systems are reviewed and otherwise negative.   PHYSICAL EXAM:  VS:  There were no vitals taken for this visit. BMI: There is no height or weight on file to calculate BMI. Well nourished, well developed, in no acute distress HEENT: normocephalic, atraumatic Neck: no JVD, carotid bruits or masses Cardiac:  RRR; no significant murmurs, no rubs, or gallops Lungs:  CTA b/l, no wheezing, rhonchi or rales Abd: soft, nontender MS: no deformity or atrophy Ext: trace - 1+ edema (waxes wanes, unchanged for years by pt/wife report) Skin: warm and dry, no rash Neuro:  No gross deficits appreciated Psych: euthymic mood, full affect  ICD site is stable, no tethering or discomfort   EKG:  Done today and reviewed by myself shows  AS/VP, 84bpm  Device interrogation  done today and reviewed by myself:  Battery reached ERI8/12/23  Lead measurements are good 70 AMS episodes All available EGMs are reviewed ONE is a very short AFib episode All others are very short PATs   04/15/2020; TTE 1. Left ventricular ejection fraction, by estimation, is 55%. The left  ventricle has normal function. The left ventricle has no regional wall  motion abnormalities. There is mild left ventricular hypertrophy. Left  ventricular diastolic parameters are  consistent with Grade I diastolic dysfunction (impaired relaxation).   2. Right ventricular systolic function is normal. The right ventricular  size is normal. Tricuspid regurgitation signal is inadequate for assessing  PA pressure.   3. The mitral valve is normal in structure. Trivial mitral valve  regurgitation. No evidence of mitral stenosis.   4. The aortic valve is tricuspid. Aortic valve regurgitation is not  visualized. Mild aortic valve sclerosis is present, with no evidence of  aortic valve stenosis.   5. The inferior vena cava is normal in size with greater than 50%  respiratory variability, suggesting right atrial pressure of 3 mmHg.   12/21/2013: TTE Study Conclusions  - Left ventricle: The cavity size was mildly dilated. Wall    thickness was increased in a pattern of mild LVH. Systolic    function was severely reduced. The estimated ejection fraction    was in the range of 20% to 25%.  - Left atrium: The atrium was moderately dilated.  - Right atrium: The atrium was mildly dilated.  - Impressions: Limited study Doppler was not done to evaluate valve    function.   Impressions:   - Limited study Doppler was not done to evaluate valve function  10/13/2014: LHC No obstructive CAD   Recent Labs: No results found for requested labs within last 365 days.  No results found for requested labs within last 365 days.   CrCl cannot be calculated (Patient's most recent lab result is older than the maximum  21 days allowed.).   Wt Readings from Last 3 Encounters:  10/24/20 243 lb (110.2 kg)  08/01/20 236 lb (107 kg)  07/10/20 239 lb (108.4 kg)     Other studies reviewed: Additional studies/records reviewed today include: summarized above  ASSESSMENT AND PLAN:  CRT-D ERI  Discussed gen change procedure,potential risks/benefits He met dr. Quentin Ore today as well He is agreeable to proceed.  Hold eliquis 3 days pre-op  Paroxysmal Afib CHA2DS2Vasc is 5, on Eliquis, appropriately dosed <1% % burden Off amiodarone 2/2 tremor  NICM Chronic CHF (systolic) Recovered LVEF by his last echo Optivol looks k 97 BP %   HTN Looks ok  HLD Reported statin intolerant  Disposition: F/u with usual post procedure appointments  Current medicines are reviewed at length with the patient today.  The patient did not have any concerns regarding medicines.  Venetia Night, PA-C 11/09/2021 7:09 AM     [address] [place] [phone] (office)  [phone] (fax)

## 2021-11-09 NOTE — Progress Notes (Signed)
Cardiology Office Note Date:  11/09/2021  Patient ID:  Walter [name], Walter [name] Aug 05, 1945, MRN [id number] PCP:  London Pepper, MD  Cardiologist:  Dr. Aundra Dubin  Electrophysiologist: Dr. Rayann Heman >> Dr. Quentin Ore   Chief Complaint:  ERI  History of Present Illness: Walter [name] is a 76 y.o. male with history of HTN, HLD, factor V Leiden + (has never seen or been recommended to see a specialist), thrombocytosis (2/2 his testosterone  taking this after his orchiectomy for testicular cancer), LBBB, NICM, DM, essential tremor, ICD, AFib.  He last saw Dr. Aundra Dubin  Jan 2022, LVEF had recovered by the last couple echos over the last few years. Discussed tremor continued despite reduction of amio dose, historically poorly tolerated AFib (RVR) and with hesitation stopped his amio.  He last saw Dr. Rayann Heman 10/24/2020, was doing well, tremor better off amiodarone.  Discussed possibly Tikosyn or ablation if AF burden increased.   ICM clinic RN follows him  TODAY He is accompanied by his wife Doing well, no CP, palpitations or cardiac awareness He has ome mild DOE that is his baseline for years and unchanged No dizzy spells, near syncope or syncope. No bleeding or signs of bleeding   Device information Abbott CRT-D implanted 02/04/2014  AFib/AAD hx Amiodarone stopped Jan 2022  2/2 tremor NOTE: there is mention historically that they were unble to pass TEE probe.  Past Medical History:  Diagnosis Date   AICD (automatic cardioverter/defibrillator) present    Anxiety    Asbestosis ([place])    mild   Atrial fibrillation (HCC)    Chronic low back pain    /sciatica- Dr Letta Median ([facility])   Complication of anesthesia 1996   previous surgery had a run of v-tach (for gallbladder-1990's)-cannot lie completely flat and panis when oxygen mask placed over face   Coronary artery disease, non-occlusive June 2011   Cardiac cath in [facility] following abnormal Myoview   Delirium 05/06/2019    DJD (degenerative joint disease)    /osteoarthritis   Dyslipidemia, goal LDL below 100    On lovastatin (myalgias with Zocor and Lipitor)   Dyspnea    worse in hot weather   Dysrhythmia    LBBB, in 2015-atrial fibrillation   Essential hypertension    Factor V Leiden [facility])    sees Dr. Cherrie Gauze Marrow- no problems with this   Frozen shoulder    right   GERD (gastroesophageal reflux disease)    H/O asbestosis    when in [employer]-1964-1970,1971-1986 in [employer]-diagnosed in 1990   History of kidney stones    Hypogonadism male    Irritable bowel    Kidney stone    Left bundle branch block (LBBB) on electrocardiogram    Diagnosed close to 20 years ago   Myocardial infarction [facility])    Non-ischemic cardiomyopathy ([place]) June 2011   EF 45-50%; suspect related to LBBB; repeat Echo March 2015: EF 50- 55%   Obesity    PONV (postoperative nausea and vomiting)    Restless leg syndrome    Rotator cuff arthropathy of right shoulder 05/14/2017   Sleep apnea    Testicular cancer ([place]) 1972   left   Tremor, essential 12/09/2015   Type 2 diabetes mellitus without complication ([place]) 10/1015   Type 2.    Wears glasses     Past Surgical History:  Procedure Laterality Date   APPENDECTOMY N/A    BI-VENTRICULAR IMPLANTABLE CARDIOVERTER DEFIBRILLATOR N/A 02/04/2014   SJM Quadra Assura BiV ICD  implanted for primary prevention by [name]   CARDIAC CATHETERIZATION  June 2011   Nonobstructive CAD   CARDIAC DEFIBRILLATOR PLACEMENT  02/04/14   [facility]. [facility] [place] model [place] 3365-40Q (serial  Number [id number]) biventricular ICD.   CARDIOVERSION N/A 10/13/2013   Procedure: CARDIOVERSION;  Surgeon: Larey Dresser, MD;  Location: [place];  Service: Cardiovascular;  Laterality: N/A;   CARDIOVERSION  10-16-2013   DCCV by [name] following intracardiac echo to rule out LAA thrombus   CARDIOVERSION N/A 10/16/2013   Procedure: CARDIOVERSION;  Surgeon: Coralyn Mark, MD;  Location: [facility] CATH LAB;   Service: Cardiovascular;  Laterality: N/A;   CHOLECYSTECTOMY OPEN     COLONOSCOPY WITH PROPOFOL N/A 12/04/2019   Procedure: COLONOSCOPY WITH PROPOFOL;  Surgeon: Wonda Horner, MD;  Location: [facility] ENDOSCOPY;  Service: Endoscopy;  Laterality: N/A;   ELECTROPHYSIOLOGY STUDY N/A 10/16/2013   Procedure: ELECTROPHYSIOLOGY STUDY;  Surgeon: Coralyn Mark, MD;  Location: [place] CATH LAB;  Service: Cardiovascular;  Laterality: N/A;   ESOPHAGOGASTRODUODENOSCOPY N/A 10/15/2013   Procedure: ESOPHAGOGASTRODUODENOSCOPY (EGD);  Surgeon: Gatha Mayer, MD;  Location: [facility] ENDOSCOPY;  Service: Endoscopy;  Laterality: N/A;  bedside   HERNIA REPAIR     KNEE ARTHROSCOPY Left    LEFT AND RIGHT HEART CATHETERIZATION WITH CORONARY ANGIOGRAM N/A 10/12/2013   Procedure: LEFT AND RIGHT HEART CATHETERIZATION WITH CORONARY ANGIOGRAM;  Surgeon: Troy Sine, MD;  Location: [facility] CATH LAB;  Service: Cardiovascular;  Laterality: N/A;   lymph nodes     RADIOFREQUENCY ABLATION NERVES     Back every 6 months   REVERSE SHOULDER ARTHROPLASTY Right 05/14/2017   Procedure: TOTAL REVERSE SHOULDER ARTHROPLASTY;  Surgeon: Marchia Bond, MD;  Location: [place];  Service: Orthopedics;  Laterality: Right;   ROTATOR CUFF REPAIR Right    SURGERY SCROTAL / TESTICULAR Left    TONSILLECTOMY Bilateral    TOTAL HIP ARTHROPLASTY Right 02/22/2015   Procedure: RIGHT TOTAL HIP ARTHROPLASTY ANTERIOR APPROACH;  Surgeon: Renette Butters, MD;  Location: [place];  Service: Orthopedics;  Laterality: Right;   TOTAL HIP ARTHROPLASTY Left 05/26/2019   Procedure: TOTAL HIP ARTHROPLASTY;  Surgeon: Marchia Bond, MD;  Location: [facility];  Service: Orthopedics;  Laterality: Left;   TRANSTHORACIC ECHOCARDIOGRAM  May 2011   EF 45% with diffuse hypokinesis; septal bounce from LBBB   TRANSTHORACIC ECHOCARDIOGRAM  March 2015   EF 50-55%. Mild concentric LVH. Septal dyssynergy from LBBB     Current Outpatient Medications  Medication Sig Dispense Refill   albuterol (PROVENTIL  HFA;VENTOLIN HFA) 108 (90 Base) MCG/ACT inhaler Inhale 1-2 puffs into the lungs every 6 (six) hours as needed for wheezing or shortness of breath.     ALTOPREV 40 MG 24 hr tablet Take 40 mg by mouth in the morning and at bedtime.     carvedilol (COREG) 12.5 MG tablet TAKE 1 TABLET TWICE A DAY 180 tablet 3   cholecalciferol (VITAMIN D) 25 MCG (1000 UNIT) tablet Take 1,000 Units by mouth daily.     Cyanocobalamin (VITAMIN B-12) 2500 MCG SUBL Place 2,500 mcg under the tongue daily.     ELIQUIS 5 MG TABS tablet TAKE 1 TABLET TWICE A DAY (NEED FOLLOW UP APPOINTMENT FOR ANY MORE REFILLS) 180 tablet 3   empagliflozin (JARDIANCE) 10 MG TABS tablet Take 10 mg by mouth daily.     esomeprazole (NEXIUM) 40 MG capsule Take 40 mg by mouth daily.     fentaNYL (DURAGESIC) 25 MCG/HR 1 patch every 3 (  three) days.     furosemide (LASIX) 20 MG tablet Take 1 tablet (20 mg total) by mouth every other day. 45 tablet 3   gabapentin (NEURONTIN) 300 MG capsule Take 300-600 mg by mouth daily as needed (pain).      lisinopril (ZESTRIL) 10 MG tablet TAKE 1 TABLET EVERY EVENING 90 tablet 3   melatonin 5 MG TABS Take 10-15 mg by mouth at bedtime as needed (Sleep).      metFORMIN (GLUCOPHAGE) 500 MG tablet Take 1,000 mg by mouth 2 (two) times daily with a meal.      metFORMIN (GLUCOPHAGE-XR) 500 MG 24 hr tablet Take 1,000 mg by mouth 2 (two) times daily.     morphine (MSIR) 15 MG tablet Take 15 mg by mouth 4 (four) times daily.     omega-3 acid ethyl esters (LOVAZA) 1 G capsule Take 1 g by mouth 2 (two) times daily.      ondansetron (ZOFRAN ODT) 4 MG disintegrating tablet Take 1 tablet (4 mg total) by mouth every 8 (eight) hours as needed for nausea or vomiting. 6 tablet 0   oxyCODONE-acetaminophen (PERCOCET) 10-325 MG tablet Take 1 tablet by mouth as needed.     pantoprazole (PROTONIX) 40 MG tablet Take 40 mg by mouth daily.     rOPINIRole (REQUIP) 1 MG tablet Take 1 mg by mouth at bedtime.      sildenafil (VIAGRA) 100 MG  tablet Take 100 mg by mouth daily as needed for erectile dysfunction.     spironolactone (ALDACTONE) 25 MG tablet TAKE 1 TABLET EVERY MORNING (NEED FOLLOW UP APPOINTMENT FOR ANYMORE REFILLS) 90 tablet 3   tadalafil (CIALIS) 10 MG tablet Take 1 tablet by mouth as needed for erectile dysfunction.     testosterone cypionate (DEPOTESTOSTERONE CYPIONATE) 200 MG/ML injection Inject 200 mg into the muscle See admin instructions. into the muscle every 10 days     tiZANidine (ZANAFLEX) 4 MG tablet Take 4 mg by mouth every 6 (six) hours as needed for muscle spasms.     No current facility-administered medications for this visit.    Allergies:   Statins and Cortisone   Social History:  The patient  reports that he quit smoking about 48 years ago. His smoking use included cigarettes. He has never used smokeless tobacco. He reports current alcohol use. He reports that he does not use drugs.   Family History:  The patient's family history includes Heart attack in his maternal uncle; Heart disease in his father, mother, and paternal uncle; Hypertension in his brother, brother, and mother.  ROS:  Please see the history of present illness.    All other systems are reviewed and otherwise negative.   PHYSICAL EXAM:  VS:  There were no vitals taken for this visit. BMI: There is no height or weight on file to calculate BMI. Well nourished, well developed, in no acute distress HEENT: normocephalic, atraumatic Neck: no JVD, carotid bruits or masses Cardiac:  RRR; no significant murmurs, no rubs, or gallops Lungs:  CTA b/l, no wheezing, rhonchi or rales Abd: soft, nontender MS: no deformity or atrophy Ext: trace - 1+ edema (waxes wanes, unchanged for years by pt/wife report) Skin: warm and dry, no rash Neuro:  No gross deficits appreciated Psych: euthymic mood, full affect  ICD site is stable, no tethering or discomfort   EKG:  Done today and reviewed by myself shows  AS/VP, 84bpm  Device interrogation  done today and reviewed by myself:  Battery reached ERI8/12/23  Lead measurements are good 70 AMS episodes All available EGMs are reviewed ONE is a very short AFib episode All others are very short PATs   04/15/2020; TTE 1. Left ventricular ejection fraction, by estimation, is 55%. The left  ventricle has normal function. The left ventricle has no regional wall  motion abnormalities. There is mild left ventricular hypertrophy. Left  ventricular diastolic parameters are  consistent with Grade I diastolic dysfunction (impaired relaxation).   2. Right ventricular systolic function is normal. The right ventricular  size is normal. Tricuspid regurgitation signal is inadequate for assessing  PA pressure.   3. The mitral valve is normal in structure. Trivial mitral valve  regurgitation. No evidence of mitral stenosis.   4. The aortic valve is tricuspid. Aortic valve regurgitation is not  visualized. Mild aortic valve sclerosis is present, with no evidence of  aortic valve stenosis.   5. The inferior vena cava is normal in size with greater than 50%  respiratory variability, suggesting right atrial pressure of 3 mmHg.   12/21/2013: TTE Study Conclusions  - Left ventricle: The cavity size was mildly dilated. Wall    thickness was increased in a pattern of mild LVH. Systolic    function was severely reduced. The estimated ejection fraction    was in the range of 20% to 25%.  - Left atrium: The atrium was moderately dilated.  - Right atrium: The atrium was mildly dilated.  - Impressions: Limited study Doppler was not done to evaluate valve    function.   Impressions:   - Limited study Doppler was not done to evaluate valve function  10/13/2014: LHC No obstructive CAD   Recent Labs: No results found for requested labs within last 365 days.  No results found for requested labs within last 365 days.   CrCl cannot be calculated (Patient's most recent lab result is older than the maximum  21 days allowed.).   Wt Readings from Last 3 Encounters:  10/24/20 243 lb (110.2 kg)  08/01/20 236 lb (107 kg)  07/10/20 239 lb (108.4 kg)     Other studies reviewed: Additional studies/records reviewed today include: summarized above  ASSESSMENT AND PLAN:  CRT-D ERI  Discussed gen change procedure,potential risks/benefits He met dr. Quentin Ore today as well He is agreeable to proceed.  Hold eliquis 3 days pre-op  Paroxysmal Afib CHA2DS2Vasc is 5, on Eliquis, appropriately dosed <1% % burden Off amiodarone 2/2 tremor  NICM Chronic CHF (systolic) Recovered LVEF by his last echo Optivol looks k 97 BP %   HTN Looks ok  HLD Reported statin intolerant  Disposition: F/u with usual post procedure appointments  Current medicines are reviewed at length with the patient today.  The patient did not have any concerns regarding medicines.  Venetia Night, PA-C 11/09/2021 7:09 AM     [address] [place] [place] [address] [phone] (office)  [phone] (fax)

## 2021-11-10 ENCOUNTER — Encounter: Payer: Self-pay | Admitting: Physician Assistant

## 2021-11-10 ENCOUNTER — Encounter: Payer: Self-pay | Admitting: *Deleted

## 2021-11-10 ENCOUNTER — Ambulatory Visit (INDEPENDENT_AMBULATORY_CARE_PROVIDER_SITE_OTHER): Payer: Medicare Other | Admitting: Physician Assistant

## 2021-11-10 VITALS — BP 128/70 | HR 84 | Ht 72.0 in | Wt 243.6 lb

## 2021-11-10 DIAGNOSIS — I10 Essential (primary) hypertension: Secondary | ICD-10-CM | POA: Diagnosis not present

## 2021-11-10 DIAGNOSIS — I480 Paroxysmal atrial fibrillation: Secondary | ICD-10-CM

## 2021-11-10 DIAGNOSIS — Z01818 Encounter for other preprocedural examination: Secondary | ICD-10-CM | POA: Diagnosis not present

## 2021-11-10 DIAGNOSIS — Z95810 Presence of automatic (implantable) cardiac defibrillator: Secondary | ICD-10-CM

## 2021-11-10 DIAGNOSIS — I5022 Chronic systolic (congestive) heart failure: Secondary | ICD-10-CM

## 2021-11-10 DIAGNOSIS — I428 Other cardiomyopathies: Secondary | ICD-10-CM

## 2021-11-10 LAB — CUP PACEART INCLINIC DEVICE CHECK
Battery Remaining Longevity: 0 mo
Brady Statistic RA Percent Paced: 4 %
Brady Statistic RV Percent Paced: 97 %
Date Time Interrogation Session: 20230825173156
HighPow Impedance: 82.125
Implantable Lead Implant Date: 20151119
Implantable Lead Implant Date: 20151119
Implantable Lead Implant Date: 20151119
Implantable Lead Location: 753858
Implantable Lead Location: 753859
Implantable Lead Location: 753860
Implantable Pulse Generator Implant Date: 20151119
Lead Channel Impedance Value: 437.5 Ohm
Lead Channel Impedance Value: 475 Ohm
Lead Channel Impedance Value: 562.5 Ohm
Lead Channel Pacing Threshold Amplitude: 0.75 V
Lead Channel Pacing Threshold Amplitude: 0.75 V
Lead Channel Pacing Threshold Amplitude: 0.75 V
Lead Channel Pacing Threshold Amplitude: 0.75 V
Lead Channel Pacing Threshold Amplitude: 1 V
Lead Channel Pacing Threshold Amplitude: 1 V
Lead Channel Pacing Threshold Pulse Width: 0.5 ms
Lead Channel Pacing Threshold Pulse Width: 0.5 ms
Lead Channel Pacing Threshold Pulse Width: 0.5 ms
Lead Channel Pacing Threshold Pulse Width: 0.5 ms
Lead Channel Pacing Threshold Pulse Width: 0.5 ms
Lead Channel Pacing Threshold Pulse Width: 0.5 ms
Lead Channel Sensing Intrinsic Amplitude: 12 mV
Lead Channel Sensing Intrinsic Amplitude: 3.1 mV
Lead Channel Setting Pacing Amplitude: 1.5 V
Lead Channel Setting Pacing Amplitude: 2 V
Lead Channel Setting Pacing Amplitude: 2.5 V
Lead Channel Setting Pacing Pulse Width: 0.5 ms
Lead Channel Setting Pacing Pulse Width: 0.5 ms
Lead Channel Setting Sensing Sensitivity: 0.5 mV
Pulse Gen Serial Number: 7211486

## 2021-11-10 NOTE — Patient Instructions (Addendum)
Medication Instructions:   Your physician recommends that you continue on your current medications as directed. Please refer to the Current Medication list given to you today.  *If you need a refill on your cardiac medications before your next appointment, please call your pharmacy*   Lab Work:  BMET AND CBC TODAY   If you have labs (blood work) drawn today and your tests are completely normal, you will receive your results only by: [place] (if you have MyChart) OR A paper copy in the mail If you have any lab test that is abnormal or we need to change your treatment, we will call you to review the results.   Testing/Procedures: SEE LETTER FOR GENERATOR CHANGE FOR 12-05-21     Follow-Up: At [facility], you and your health needs are our priority.  As part of our continuing mission to provide you with exceptional heart care, we have created designated Provider Care Teams.  These Care Teams include your primary Cardiologist (physician) and Advanced Practice Providers (APPs -  Physician Assistants and Nurse Practitioners) who all work together to provide you with the care you need, when you need it.  We recommend signing up for the patient portal called "MyChart".  Sign up information is provided on this After Visit Summary.  MyChart is used to connect with patients for Virtual Visits (Telemedicine).  Patients are able to view lab/test results, encounter notes, upcoming appointments, etc.  Non-urgent messages can be sent to your provider as well.   To learn more about what you can do with MyChart, go to [email or web address].    Your next appointment: AFTER  12-05-21 14 DAYS WOUND CHECK                           AND    3 month(s) ( 91  DAYS)  The format for your next appointment:   In Person  Provider:   Lars Mage, MD    Other Instructions   Important Information About Sugar

## 2021-11-10 NOTE — Progress Notes (Signed)
No updated ICM report received.

## 2021-11-11 LAB — CBC
Hematocrit: 58.8 % — ABNORMAL HIGH (ref 37.5–51.0)
Hemoglobin: 19.6 g/dL — ABNORMAL HIGH (ref 13.0–17.7)
MCH: 31.5 pg (ref 26.6–33.0)
MCHC: 33.3 g/dL (ref 31.5–35.7)
MCV: 94 fL (ref 79–97)
Platelets: 201 10*3/uL (ref 150–450)
RBC: 6.23 x10E6/uL — ABNORMAL HIGH (ref 4.14–5.80)
RDW: 13.9 % (ref 11.6–15.4)
WBC: 6.6 10*3/uL (ref 3.4–10.8)

## 2021-11-11 LAB — BASIC METABOLIC PANEL
BUN/Creatinine Ratio: 10 (ref 10–24)
BUN: 13 mg/dL (ref 8–27)
CO2: 25 mmol/L (ref 20–29)
Calcium: 9.6 mg/dL (ref 8.6–10.2)
Chloride: 97 mmol/L (ref 96–106)
Creatinine, Ser: 1.36 mg/dL — ABNORMAL HIGH (ref 0.76–1.27)
Glucose: 162 mg/dL — ABNORMAL HIGH (ref 70–99)
Potassium: 4.2 mmol/L (ref 3.5–5.2)
Sodium: 139 mmol/L (ref 134–144)
eGFR: 54 mL/min/{1.73_m2} — ABNORMAL LOW (ref >59)

## 2021-11-17 ENCOUNTER — Ambulatory Visit (HOSPITAL_COMMUNITY)
Admission: RE | Admit: 2021-11-17 | Discharge: 2021-11-17 | Disposition: A | Discharge status: Home / Self Care | Payer: Medicare Other | Source: Ambulatory Visit | Attending: Cardiology | Admitting: Cardiology

## 2021-11-17 ENCOUNTER — Encounter (HOSPITAL_COMMUNITY): Payer: Self-pay | Admitting: Cardiology

## 2021-11-17 VITALS — BP 110/70 | HR 66 | Wt 243.8 lb

## 2021-11-17 DIAGNOSIS — Z79899 Other long term (current) drug therapy: Secondary | ICD-10-CM | POA: Insufficient documentation

## 2021-11-17 DIAGNOSIS — I5022 Chronic systolic (congestive) heart failure: Secondary | ICD-10-CM | POA: Diagnosis not present

## 2021-11-17 DIAGNOSIS — Z8249 Family history of ischemic heart disease and other diseases of the circulatory system: Secondary | ICD-10-CM | POA: Insufficient documentation

## 2021-11-17 DIAGNOSIS — I428 Other cardiomyopathies: Secondary | ICD-10-CM | POA: Diagnosis not present

## 2021-11-17 DIAGNOSIS — I519 Heart disease, unspecified: Secondary | ICD-10-CM

## 2021-11-17 DIAGNOSIS — Z7984 Long term (current) use of oral hypoglycemic drugs: Secondary | ICD-10-CM | POA: Insufficient documentation

## 2021-11-17 DIAGNOSIS — I480 Paroxysmal atrial fibrillation: Secondary | ICD-10-CM | POA: Insufficient documentation

## 2021-11-17 DIAGNOSIS — I2510 Atherosclerotic heart disease of native coronary artery without angina pectoris: Secondary | ICD-10-CM | POA: Diagnosis not present

## 2021-11-17 DIAGNOSIS — I509 Heart failure, unspecified: Secondary | ICD-10-CM | POA: Diagnosis not present

## 2021-11-17 DIAGNOSIS — E785 Hyperlipidemia, unspecified: Secondary | ICD-10-CM | POA: Insufficient documentation

## 2021-11-17 DIAGNOSIS — I110 Hypertensive heart disease with heart failure: Secondary | ICD-10-CM | POA: Insufficient documentation

## 2021-11-17 DIAGNOSIS — Z7901 Long term (current) use of anticoagulants: Secondary | ICD-10-CM | POA: Diagnosis not present

## 2021-11-17 LAB — BASIC METABOLIC PANEL
Anion gap: 6 (ref 5–15)
BUN: 13 mg/dL (ref 8–23)
CO2: 28 mmol/L (ref 22–32)
Calcium: 9 mg/dL (ref 8.9–10.3)
Chloride: 103 mmol/L (ref 98–111)
Creatinine, Ser: 1.39 mg/dL — ABNORMAL HIGH (ref 0.61–1.24)
GFR, Estimated: 52 mL/min — ABNORMAL LOW (ref >60)
Glucose, Bld: 155 mg/dL — ABNORMAL HIGH (ref 70–99)
Potassium: 3.7 mmol/L (ref 3.5–5.1)
Sodium: 137 mmol/L (ref 135–145)

## 2021-11-17 LAB — LIPID PANEL
Cholesterol: 139 mg/dL (ref 0–200)
HDL: 34 mg/dL — ABNORMAL LOW (ref >40)
LDL Cholesterol: 77 mg/dL (ref 0–99)
Total CHOL/HDL Ratio: 4.1 RATIO
Triglycerides: 141 mg/dL (ref <150)
VLDL: 28 mg/dL (ref 0–40)

## 2021-11-17 LAB — CBC
HCT: 58.9 % — ABNORMAL HIGH (ref 39.0–52.0)
Hemoglobin: 19.4 g/dL — ABNORMAL HIGH (ref 13.0–17.0)
MCH: 31.3 pg (ref 26.0–34.0)
MCHC: 32.9 g/dL (ref 30.0–36.0)
MCV: 95.2 fL (ref 80.0–100.0)
Platelets: 177 10*3/uL (ref 150–400)
RBC: 6.19 MIL/uL — ABNORMAL HIGH (ref 4.22–5.81)
RDW: 14.3 % (ref 11.5–15.5)
WBC: 6.3 10*3/uL (ref 4.0–10.5)
nRBC: 0 % (ref 0.0–0.2)

## 2021-11-17 MED ORDER — FUROSEMIDE 20 MG PO TABS: 20.0000 mg | ORAL_TABLET | ORAL | 3 refills | Status: DC | PRN

## 2021-11-17 NOTE — Patient Instructions (Signed)
Change lasix to as needed for weight gain of 3lb in 24 hours or 5lb in a week.  Labs done today, your results will be available in MyChart, we will contact you for abnormal readings.  Your physician recommends that you schedule a follow-up appointment in: 6 months ( March 2024)  ** please call the office in January yo arrange your follow up appointment **  If you have any questions or concerns before your next appointment please send [place] a message through [place] or call our office at [phone].    TO LEAVE A MESSAGE FOR THE NURSE SELECT OPTION 2, PLEASE LEAVE A MESSAGE INCLUDING: YOUR NAME DATE OF BIRTH CALL BACK NUMBER REASON FOR CALL**this is important as we prioritize the call backs  YOU WILL RECEIVE A CALL BACK THE SAME DAY AS LONG AS YOU CALL BEFORE 4:00 PM  At the [facility], you and your health needs are our priority. As part of our continuing mission to provide you with exceptional heart care, we have created designated Provider Care Teams. These Care Teams include your primary Cardiologist (physician) and Advanced Practice Providers (APPs- Physician Assistants and Nurse Practitioners) who all work together to provide you with the care you need, when you need it.   You may see any of the following providers on your designated Care Team at your next follow up: Dr Glori Bickers Dr Loralie Champagne Dr. Roxana Hires, NP Lyda Jester, [place] [facility] [place], [place] Forestine Na, NP Audry Riles, PharmD   Please be sure to bring in all your medications bottles to every appointment.

## 2021-11-19 NOTE — Progress Notes (Signed)
Date:  11/19/2021   ID:  Walter [name], DOB 05/27/1945, MRN [id number]   Provider location: [place] [facility] Type of Visit: Established patient   PCP:  London Pepper, MD  Cardiologist:  Dr. Aundra Dubin   History of Present Illness: Walter [name] is a 76 y.o. male who has a history of paroxysmal atrial fibrillation, chronic LBBB and cardiomyopathy.  He was found to have LBBB 15-20 years ago.  In 2011, he had a stress test in [facility] that was abnormal so he was taken for [facility] in 6/11 that showed nonobstructive disease.  He had an echo in 5/11 that showed EF 45-50%, diffuse hypokinesis suggesting a mild nonischemic cardiomyopathy.    He has admitted in 7/15 with atrial fibrillation with RVR.  Rate was very difficult to control.  TEE failed due to inability to pass probe x 2.  Eventually, ICE was done to rule out LAA thrombus and he was cardioverted to NSR.  He is on amiodarone now and in NSR.  Echo in 7/15 showed EF 20% with diffuse hypokinesis in setting of atrial fibrillation/RVR.  LHC was also done in 7/15 with minimal coronary disease.  Repeat echo in 10/15 showed EF 20-25%.  In 11/15, he had St Jude CRT-D device placed.  Repeat echo in 4/16 showed EF improved to 50% with mild LVH. Repeat echo in 11/17 showed EF 50-55%, mildly dilated RV with normal systolic function.  Echo in 12/19 again showed EF 50-55% with moderate LVH.   Left THR in 3/21.   Echo (5/21): EF 55%, mild LVH, normal RV, PASP 31 mmHg.  Echo was done today and reviewed, EF 55% with normal RV and normal-sized IVC.    He returns for followup of CHF and paroxysmal atrial fibrillation.  Weight down 12 lbs since last appointment.  He is feeling good in general.  Works in his yard.  Mild dyspnea if he does a lot of work in the heat.  No chest pain.  Rare lightheadedness with standing.  No palpitations.  He takes Lasix occasionally.   St Jude device interrogation: >99% BiV paced, no VT/AF, stable thoracic impedance.     ECG (personally reviewed): NSR, BiV paced   Labs (11/14): K 3.9, creatinine 1.14, LDL 104, LFTs normal Labs (2/15): HCT 56.3 Labs (9/15): K 4.1, creatinine 1.1, digoxin 0.9, TSH normal, LFTs normal Labs (10/15): TGs 262, LDL 82, BNP 68 Labs (11/15): K 4.1, creatinine 1.12 Labs (12/15): K 4.8, creatinine 1.02 Labs (3/16): K 4.6, creatinine 1.06, digoxin 0.8, LFTs normal, TSH normal Labs (11/16): K 4.2, creatinine 1.17 => 1.3, HCT 56.2, TSH normal, LDL 75, HDL 34, LFTs normal Labs (2/17): K 4.4, creatinine 1.18, BNP 20, HCT 50.8, LFTs normal Labs (12/17): K 4.3, creatinine 1.4 Labs (1/18): K 4.8, creatinine 1.26, hgb 18.1, LFTs normal, TSH normal.  Labs (2/19): K 4, creatinine 1.5 Labs (11/19): LDL 58 Labs (5/20): K 4.3, creatinine 1.43 Labs (4/21): K 4.3, creatinine 1.3, TSH normal, LFTs normal Labs (8/23): K 4.2, creatinine 1.36   PMH: 1. Type II diabetes 2. HTN 3. Adrenal nodule 4. Restless leg syndrome 5. OA with right hip pain, right frozen shoulder, low back pain. S/p R THR in 12/16.  S/p right shoulder replacement in 2/19. s/p left THR in 3/21.  6. Testicular cancer: 1972 7. Chronic LBBB: Had Cardiolite in 2011 that was abnormal so had LHC in 6/11 in [place], [place].  This showed 30% LCx stenosis and 40% ostial  RCA stenosis.  LHC in 7/15 in [place] showed minimal coronary disease.  8. 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   - Echo (5/11) with EF 45-50%, moderate LVH.  - Echo (7/15) with EF 20%, diffuse hypokinesis, mild to moderately decreased RV systolic function (in setting of afib with RVR).  - LHC (7/15) with minimal coronary disease.   - Echo (10/15) with EF 20-25%, mild LVH, moderate LAE.  - St Jude CRT-D device placed in 11/15.  - Echo (4/16) with EF 50%, mild LVH.  - Echo (11/17): EF 50-55%, mildly dilated RV with normal systolic function.  - Echo (12/19): EF 50-55%, moderate LVH, mildly  dilated RV with normal systolic function.  - Echo (5/21): EF 55%, mild LVH, RV normal, PASP 31 mmHg.  9. Factor V Leiden + 10. Asbestosis: Mild.  Exposure while in the [employer].  11. GERD 12. PVCs 13. Polycythemia: Uncertain etiology 14. Hyperlipidemia: Myalgias with Zocor and Lipitor.  15. Atrial fibrillation: Paroxysmal.  Unable to pass TEE probe.  16. Tremor  Current Outpatient Medications  Medication Sig Dispense Refill   albuterol (PROVENTIL HFA;VENTOLIN HFA) 108 (90 Base) MCG/ACT inhaler Inhale 1-2 puffs into the lungs every 6 (six) hours as needed for wheezing or shortness of breath.     carvedilol (COREG) 12.5 MG tablet TAKE 1 TABLET TWICE A DAY 180 tablet 3   cholecalciferol (VITAMIN D) 25 MCG (1000 UNIT) tablet Take 1,000 Units by mouth daily.     Cyanocobalamin (VITAMIN B-12) 2500 MCG SUBL Place 2,500 mcg under the tongue daily.     ELIQUIS 5 MG TABS tablet TAKE 1 TABLET TWICE A DAY (NEED FOLLOW UP APPOINTMENT FOR ANY MORE REFILLS) 180 tablet 3   empagliflozin (JARDIANCE) 10 MG TABS tablet Take 10 mg by mouth in the morning and at bedtime.     esomeprazole (NEXIUM) 40 MG capsule Take 40 mg by mouth daily.     gabapentin (NEURONTIN) 100 MG capsule Take 100 mg by mouth in the morning.     gabapentin (NEURONTIN) 300 MG capsule Take 300-600 mg by mouth daily as needed (pain).      lisinopril (ZESTRIL) 10 MG tablet TAKE 1 TABLET EVERY EVENING 90 tablet 3   lovastatin (ALTOPREV) 40 MG 24 hr tablet Take 40 mg by mouth at bedtime.     melatonin 5 MG TABS Take 10-15 mg by mouth at bedtime as needed (Sleep).      metFORMIN (GLUCOPHAGE) 500 MG tablet Take 1,000 mg by mouth 2 (two) times daily with a meal.     morphine (MSIR) 15 MG tablet Take 15 mg by mouth 2 (two) times daily.     omega-3 acid ethyl esters (LOVAZA) 1 G capsule Take 1 g by mouth 2 (two) times daily.      ondansetron (ZOFRAN ODT) 4 MG disintegrating tablet Take 1 tablet (4 mg total) by mouth every 8 (eight) hours as  needed for nausea or vomiting. 6 tablet 0   rOPINIRole (REQUIP) 1 MG tablet Take 1 mg by mouth at bedtime.      spironolactone (ALDACTONE) 25 MG tablet TAKE 1 TABLET EVERY MORNING (NEED FOLLOW UP APPOINTMENT FOR ANYMORE REFILLS) 90 tablet 3   Testosterone Cypionate 200 MG/ML KIT Inject 200 mg into the muscle once a week. Q 10 days     tiZANidine (ZANAFLEX) 4 MG tablet Take 4 mg by mouth every 6 (six) hours as needed for muscle spasms.     furosemide (LASIX) 20  MG tablet Take 1 tablet (20 mg total) by mouth as needed. 45 tablet 3   No current facility-administered medications for this encounter.    Allergies:   Statins and Cortisone   Social History:  The patient  reports that he quit smoking about 48 years ago. His smoking use included cigarettes. He has never used smokeless tobacco. He reports current alcohol use. He reports that he does not use drugs.   Family History:  The patient's family history includes Heart attack in his maternal uncle; Heart disease in his father, mother, and paternal uncle; Hypertension in his brother, brother, and mother.   ROS:  Please see the history of present illness.   All other systems are personally reviewed and negative.   Exam:   BP 110/70   Pulse 66   Wt 110.6 kg (243 lb 12.8 oz)   SpO2 95%   BMI 33.07 kg/m  General: NAD Neck: No JVD, no thyromegaly or thyroid nodule.  Lungs: Clear to auscultation bilaterally with normal respiratory effort. CV: Nondisplaced PMI.  Heart regular S1/S2, no S3/S4, no murmur.  No peripheral edema.  No carotid bruit.  Normal pedal pulses.  Abdomen: Soft, nontender, no hepatosplenomegaly, no distention.  Skin: Intact without lesions or rashes.  Neurologic: Alert and oriented x 3.  Psych: Normal affect. Extremities: No clubbing or cyanosis.  HEENT: Normal.   Recent Labs: 11/17/2021: BUN 13; Creatinine, Ser 1.39; Hemoglobin 19.4; Platelets 177; Potassium 3.7; Sodium 137  Personally reviewed   Wt Readings from Last 3  Encounters:  11/17/21 110.6 kg (243 lb 12.8 oz)  11/10/21 110.5 kg (243 lb 9.6 oz)  10/24/20 110.2 kg (243 lb)      ASSESSMENT AND PLAN:  1. Cardiomyopathy: Nonischemic.  There was a pre-existing possible LBBB cardiomyopathy, but EF was down to 20% in the setting of atrial fibrillation with RVR in 7/15 (suggesting tachycardia-mediated cardiomyopathy).  However, EF did not improve in NSR (20-25% on repeat echo in 10/15).  Now with St Jude CRT-D device.  No significant coronary disease on 7/15 LHC. Since placement of CRT-D, EF has improved to 55% on last echo in 5/21.  NYHA class II symptoms.  He does not look volume overloaded or by Corvue.   - Continue current Coreg, spironolactone, and lisinopril.  BMET today.   - He will use Lasix prn.  - Continue empagliflozin 2. CAD: Nonobstructive, mild. No chest pain.  Has been unable to tolerate any statin.  No aspirin as he is anticoagulated.  3. Hyperlipidemia: Unable to take statins due to myalgias.    4. HTN: BP is controlled. 5. Atrial fibrillation: Paroxysmal.  He had tremor with amiodarone and is now off.  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  No recent AF on device interrogation.  - Continue Eliquis.  - If he has recurrent atrial fibrillation, would favor ablation (versus dofetilide).    Followup in 6 months with APP.   Signed, Loralie Champagne, MD  11/19/2021   [facility] [address] [facility] [place] [address] [phone] (office) [phone] (fax)

## 2021-11-22 LAB — CUP PACEART REMOTE DEVICE CHECK
Battery Remaining Longevity: 0 mo
Battery Voltage: 2.59 V
Brady Statistic AP VP Percent: 2.5 %
Brady Statistic AP VS Percent: 1 %
Brady Statistic AS VP Percent: 96 %
Brady Statistic AS VS Percent: 1 %
Brady Statistic RA Percent Paced: 3 %
Date Time Interrogation Session: 20230905023318
HighPow Impedance: 74 Ohm
HighPow Impedance: 74 Ohm
Implantable Lead Implant Date: 20151119
Implantable Lead Implant Date: 20151119
Implantable Lead Implant Date: 20151119
Implantable Lead Location: 753858
Implantable Lead Location: 753859
Implantable Lead Location: 753860
Implantable Pulse Generator Implant Date: 20151119
Lead Channel Impedance Value: 390 Ohm
Lead Channel Impedance Value: 450 Ohm
Lead Channel Impedance Value: 550 Ohm
Lead Channel Pacing Threshold Amplitude: 0.75 V
Lead Channel Pacing Threshold Amplitude: 0.75 V
Lead Channel Pacing Threshold Amplitude: 1 V
Lead Channel Pacing Threshold Pulse Width: 0.5 ms
Lead Channel Pacing Threshold Pulse Width: 0.5 ms
Lead Channel Pacing Threshold Pulse Width: 0.5 ms
Lead Channel Sensing Intrinsic Amplitude: 12 mV
Lead Channel Sensing Intrinsic Amplitude: 3.1 mV
Lead Channel Setting Pacing Amplitude: 1.5 V
Lead Channel Setting Pacing Amplitude: 2 V
Lead Channel Setting Pacing Amplitude: 2.5 V
Lead Channel Setting Pacing Pulse Width: 0.5 ms
Lead Channel Setting Pacing Pulse Width: 0.5 ms
Lead Channel Setting Sensing Sensitivity: 0.5 mV
Pulse Gen Serial Number: 7211486

## 2021-11-23 ENCOUNTER — Ambulatory Visit (INDEPENDENT_AMBULATORY_CARE_PROVIDER_SITE_OTHER): Payer: Medicare Other

## 2021-11-23 DIAGNOSIS — M1990 Unspecified osteoarthritis, unspecified site: Secondary | ICD-10-CM | POA: Diagnosis not present

## 2021-11-23 DIAGNOSIS — M25519 Pain in unspecified shoulder: Secondary | ICD-10-CM | POA: Diagnosis not present

## 2021-11-23 DIAGNOSIS — G894 Chronic pain syndrome: Secondary | ICD-10-CM | POA: Diagnosis not present

## 2021-11-23 DIAGNOSIS — M5459 Other low back pain: Secondary | ICD-10-CM | POA: Diagnosis not present

## 2021-11-23 DIAGNOSIS — M47817 Spondylosis without myelopathy or radiculopathy, lumbosacral region: Secondary | ICD-10-CM | POA: Diagnosis not present

## 2021-11-23 DIAGNOSIS — Z79891 Long term (current) use of opiate analgesic: Secondary | ICD-10-CM | POA: Diagnosis not present

## 2021-11-23 DIAGNOSIS — M1710 Unilateral primary osteoarthritis, unspecified knee: Secondary | ICD-10-CM | POA: Diagnosis not present

## 2021-11-23 DIAGNOSIS — M47818 Spondylosis without myelopathy or radiculopathy, sacral and sacrococcygeal region: Secondary | ICD-10-CM | POA: Diagnosis not present

## 2021-11-23 DIAGNOSIS — Z79899 Other long term (current) drug therapy: Secondary | ICD-10-CM | POA: Diagnosis not present

## 2021-11-23 DIAGNOSIS — M533 Sacrococcygeal disorders, not elsewhere classified: Secondary | ICD-10-CM | POA: Diagnosis not present

## 2021-11-23 DIAGNOSIS — I428 Other cardiomyopathies: Secondary | ICD-10-CM

## 2021-11-23 DIAGNOSIS — M25559 Pain in unspecified hip: Secondary | ICD-10-CM | POA: Diagnosis not present

## 2021-11-23 DIAGNOSIS — M25569 Pain in unspecified knee: Secondary | ICD-10-CM | POA: Diagnosis not present

## 2021-11-23 NOTE — Progress Notes (Signed)
Remote ICD transmission.   

## 2021-11-24 ENCOUNTER — Encounter: Payer: Self-pay | Admitting: Cardiology

## 2021-12-04 ENCOUNTER — Telehealth: Payer: Self-pay | Admitting: Cardiology

## 2021-12-04 NOTE — Telephone Encounter (Signed)
Patient called let us know he forgot to stop his Eliquis prior to his procedure which is scheduled for tomorrow. I advised him I would notify Dr Quentin Ore and his nurse someone will call him back.

## 2021-12-04 NOTE — Telephone Encounter (Signed)
Advised the patient that we would need to delay his procedure to Sept 21 with arrival time of 11:30 am. Advised to start holding Eliquis now.  Patient verbalized understanding with read back.   Called and move procedure with the cath lab.

## 2021-12-04 NOTE — Telephone Encounter (Signed)
Pt c/o medication issue:  1. Name of Medication:   ELIQUIS 5 MG TABS tablet    2. How are you currently taking this medication (dosage and times per day)? TAKE 1 TABLET TWICE A DAY   3. Are you having a reaction (difficulty breathing--STAT)? No  4. What is your medication issue? Pt states that he is scheduled to have a procedure tomorrow 9/19. Pt forgot to stop medication and would like a callback. Please advise

## 2021-12-07 ENCOUNTER — Ambulatory Visit (HOSPITAL_COMMUNITY)
Admission: RE | Disposition: A | Discharge status: Home / Self Care | Payer: Self-pay | Source: Home / Self Care | Attending: Cardiology

## 2021-12-07 ENCOUNTER — Other Ambulatory Visit: Payer: Self-pay

## 2021-12-07 ENCOUNTER — Ambulatory Visit (HOSPITAL_COMMUNITY)
Admission: RE | Admit: 2021-12-07 | Discharge: 2021-12-07 | Disposition: A | Discharge status: Home / Self Care | Payer: Medicare Other | Attending: Cardiology | Admitting: Cardiology

## 2021-12-07 DIAGNOSIS — G250 Essential tremor: Secondary | ICD-10-CM | POA: Diagnosis not present

## 2021-12-07 DIAGNOSIS — D6851 Activated protein C resistance: Secondary | ICD-10-CM | POA: Insufficient documentation

## 2021-12-07 DIAGNOSIS — I428 Other cardiomyopathies: Secondary | ICD-10-CM | POA: Insufficient documentation

## 2021-12-07 DIAGNOSIS — I480 Paroxysmal atrial fibrillation: Secondary | ICD-10-CM | POA: Insufficient documentation

## 2021-12-07 DIAGNOSIS — Z87891 Personal history of nicotine dependence: Secondary | ICD-10-CM | POA: Insufficient documentation

## 2021-12-07 DIAGNOSIS — Z8547 Personal history of malignant neoplasm of testis: Secondary | ICD-10-CM | POA: Diagnosis not present

## 2021-12-07 DIAGNOSIS — I110 Hypertensive heart disease with heart failure: Secondary | ICD-10-CM | POA: Insufficient documentation

## 2021-12-07 DIAGNOSIS — I5022 Chronic systolic (congestive) heart failure: Secondary | ICD-10-CM | POA: Diagnosis not present

## 2021-12-07 DIAGNOSIS — E119 Type 2 diabetes mellitus without complications: Secondary | ICD-10-CM | POA: Insufficient documentation

## 2021-12-07 DIAGNOSIS — Z4502 Encounter for adjustment and management of automatic implantable cardiac defibrillator: Secondary | ICD-10-CM | POA: Diagnosis not present

## 2021-12-07 DIAGNOSIS — Z7984 Long term (current) use of oral hypoglycemic drugs: Secondary | ICD-10-CM | POA: Diagnosis not present

## 2021-12-07 DIAGNOSIS — E785 Hyperlipidemia, unspecified: Secondary | ICD-10-CM | POA: Insufficient documentation

## 2021-12-07 DIAGNOSIS — Z7901 Long term (current) use of anticoagulants: Secondary | ICD-10-CM | POA: Insufficient documentation

## 2021-12-07 HISTORY — PX: BIV ICD GENERATOR CHANGEOUT: EP1194

## 2021-12-07 LAB — GLUCOSE, CAPILLARY: Glucose-Capillary: 158 mg/dL — ABNORMAL HIGH (ref 70–99)

## 2021-12-07 SURGERY — BIV ICD GENERATOR CHANGEOUT

## 2021-12-07 MED ORDER — LIDOCAINE HCL (PF) 1 % IJ SOLN
INTRAMUSCULAR | Status: DC | PRN
  Administered 2021-12-07: 60 mL

## 2021-12-07 MED ORDER — FENTANYL CITRATE (PF) 100 MCG/2ML IJ SOLN
INTRAMUSCULAR | Status: AC
  Filled 2021-12-07: qty 2

## 2021-12-07 MED ORDER — ACETAMINOPHEN 325 MG PO TABS: 325.0000 mg | ORAL_TABLET | ORAL | Status: DC | PRN

## 2021-12-07 MED ORDER — LIDOCAINE HCL (PF) 1 % IJ SOLN
INTRAMUSCULAR | Status: AC
  Filled 2021-12-07: qty 60

## 2021-12-07 MED ORDER — APIXABAN 5 MG PO TABS: ORAL_TABLET | ORAL | 3 refills | Status: DC

## 2021-12-07 MED ORDER — MIDAZOLAM HCL 5 MG/5ML IJ SOLN
INTRAMUSCULAR | Status: AC
  Filled 2021-12-07: qty 5

## 2021-12-07 MED ORDER — CEFAZOLIN SODIUM-DEXTROSE 2-4 GM/100ML-% IV SOLN
2.0000 g | INTRAVENOUS | Status: AC
  Administered 2021-12-07: 2 g via INTRAVENOUS

## 2021-12-07 MED ORDER — SODIUM CHLORIDE 0.9 % IV SOLN
INTRAVENOUS | Status: DC
Start: 2021-12-07 — End: 2021-12-07

## 2021-12-07 MED ORDER — ONDANSETRON HCL 4 MG/2ML IJ SOLN: 4.0000 mg | Freq: Four times a day (QID) | INTRAMUSCULAR | Status: DC | PRN

## 2021-12-07 MED ORDER — MIDAZOLAM HCL 5 MG/5ML IJ SOLN
INTRAMUSCULAR | Status: DC | PRN
  Administered 2021-12-07: 1 mg via INTRAVENOUS

## 2021-12-07 MED ORDER — SODIUM CHLORIDE 0.9 % IV SOLN
INTRAVENOUS | Status: AC
  Filled 2021-12-07: qty 2

## 2021-12-07 MED ORDER — SODIUM CHLORIDE 0.9 % IV SOLN
80.0000 mg | INTRAVENOUS | Status: AC
  Administered 2021-12-07: 80 mg

## 2021-12-07 MED ORDER — CHLORHEXIDINE GLUCONATE 4 % EX LIQD
4.0000 | Freq: Once | CUTANEOUS | Status: DC
Start: 2021-12-07 — End: 2021-12-07

## 2021-12-07 MED ORDER — CEFAZOLIN SODIUM-DEXTROSE 2-4 GM/100ML-% IV SOLN
INTRAVENOUS | Status: AC
  Filled 2021-12-07: qty 100

## 2021-12-07 SURGICAL SUPPLY — 6 items
CABLE SURGICAL S-101-97-12 (CABLE) ×1 IMPLANT
ICD GALLANT HFCRTD CDHFA500Q (ICD Generator) IMPLANT
PAD DEFIB RADIO PHYSIO CONN (PAD) ×1 IMPLANT
POUCH AIGIS-R ANTIBACT ICD (Mesh General) ×1 IMPLANT
POUCH AIGIS-R ANTIBACT ICD LRG (Mesh General) IMPLANT
TRAY PACEMAKER INSERTION (PACKS) ×1 IMPLANT

## 2021-12-07 NOTE — Interval H&P Note (Signed)
History and Physical Interval Note:  12/07/2021 3:40 PM  Walter [name]  has presented today for surgery, with the diagnosis of ERI.  The various methods of treatment have been discussed with the patient and family. After consideration of risks, benefits and other options for treatment, the patient has consented to  Procedure(s): BIV ICD [place] (N/A) as a surgical intervention.  The patient's history has been reviewed, patient examined, no change in status, stable for surgery.  I have reviewed the patient's chart and labs.  Questions were answered to the patient's satisfaction.    Presents for CRTD gen change.   Risks, benefits, and alternatives to ICD pulse generator replacement were discussed in detail today.  The patient understands that risks include but are not limited to bleeding, infection, pneumothorax, perforation, tamponade, vascular damage, renal failure, MI, stroke, death, inappropriate shocks, damage to his existing leads, and lead dislodgement and wishes to proceed.      Lenzy Kerschner T Oluwademilade Mckiver

## 2021-12-08 ENCOUNTER — Encounter (HOSPITAL_COMMUNITY): Payer: Self-pay | Admitting: Cardiology

## 2021-12-08 MED FILL — Fentanyl Citrate Preservative Free (PF) Inj 100 MCG/2ML: INTRAMUSCULAR | Qty: 2 | Status: AC

## 2021-12-09 NOTE — Progress Notes (Signed)
Remote ICD transmission.   

## 2021-12-11 NOTE — Progress Notes (Signed)
ICM remote transmission rescheduled, after battery replacement (9/21), for 01/29/2022 to allow Corvue Impedance baseline to develop.

## 2021-12-26 DIAGNOSIS — M533 Sacrococcygeal disorders, not elsewhere classified: Secondary | ICD-10-CM | POA: Diagnosis not present

## 2021-12-26 DIAGNOSIS — M25559 Pain in unspecified hip: Secondary | ICD-10-CM | POA: Diagnosis not present

## 2021-12-26 DIAGNOSIS — M5459 Other low back pain: Secondary | ICD-10-CM | POA: Diagnosis not present

## 2021-12-26 DIAGNOSIS — M47817 Spondylosis without myelopathy or radiculopathy, lumbosacral region: Secondary | ICD-10-CM | POA: Diagnosis not present

## 2021-12-26 DIAGNOSIS — M25519 Pain in unspecified shoulder: Secondary | ICD-10-CM | POA: Diagnosis not present

## 2021-12-26 DIAGNOSIS — M47818 Spondylosis without myelopathy or radiculopathy, sacral and sacrococcygeal region: Secondary | ICD-10-CM | POA: Diagnosis not present

## 2021-12-26 DIAGNOSIS — Z79899 Other long term (current) drug therapy: Secondary | ICD-10-CM | POA: Diagnosis not present

## 2021-12-26 DIAGNOSIS — M1710 Unilateral primary osteoarthritis, unspecified knee: Secondary | ICD-10-CM | POA: Diagnosis not present

## 2021-12-26 DIAGNOSIS — M1990 Unspecified osteoarthritis, unspecified site: Secondary | ICD-10-CM | POA: Diagnosis not present

## 2021-12-26 DIAGNOSIS — G894 Chronic pain syndrome: Secondary | ICD-10-CM | POA: Diagnosis not present

## 2021-12-26 DIAGNOSIS — M25569 Pain in unspecified knee: Secondary | ICD-10-CM | POA: Diagnosis not present

## 2021-12-26 DIAGNOSIS — Z79891 Long term (current) use of opiate analgesic: Secondary | ICD-10-CM | POA: Diagnosis not present

## 2021-12-27 ENCOUNTER — Ambulatory Visit: Payer: Medicare Other | Attending: Cardiology

## 2021-12-27 DIAGNOSIS — I428 Other cardiomyopathies: Secondary | ICD-10-CM | POA: Insufficient documentation

## 2021-12-27 LAB — CUP PACEART INCLINIC DEVICE CHECK
Battery Remaining Longevity: 87 mo
Brady Statistic RA Percent Paced: 13 %
Brady Statistic RV Percent Paced: 97 %
Date Time Interrogation Session: 20231011094800
HighPow Impedance: 73.125
Implantable Lead Implant Date: 20151119
Implantable Lead Implant Date: 20151119
Implantable Lead Implant Date: 20151119
Implantable Lead Location: 753858
Implantable Lead Location: 753859
Implantable Lead Location: 753860
Implantable Pulse Generator Implant Date: 20230921
Lead Channel Impedance Value: 400 Ohm
Lead Channel Impedance Value: 475 Ohm
Lead Channel Impedance Value: 575 Ohm
Lead Channel Pacing Threshold Amplitude: 0.75 V
Lead Channel Pacing Threshold Amplitude: 0.75 V
Lead Channel Pacing Threshold Amplitude: 1 V
Lead Channel Pacing Threshold Amplitude: 1 V
Lead Channel Pacing Threshold Amplitude: 1 V
Lead Channel Pacing Threshold Amplitude: 1 V
Lead Channel Pacing Threshold Pulse Width: 0.5 ms
Lead Channel Pacing Threshold Pulse Width: 0.5 ms
Lead Channel Pacing Threshold Pulse Width: 0.5 ms
Lead Channel Pacing Threshold Pulse Width: 0.5 ms
Lead Channel Pacing Threshold Pulse Width: 0.5 ms
Lead Channel Pacing Threshold Pulse Width: 0.5 ms
Lead Channel Sensing Intrinsic Amplitude: 12 mV
Lead Channel Sensing Intrinsic Amplitude: 3.6 mV
Lead Channel Setting Pacing Amplitude: 1.5 V
Lead Channel Setting Pacing Amplitude: 2 V
Lead Channel Setting Pacing Amplitude: 2.5 V
Lead Channel Setting Pacing Pulse Width: 0.5 ms
Lead Channel Setting Pacing Pulse Width: 0.5 ms
Lead Channel Setting Sensing Sensitivity: 0.5 mV
Pulse Gen Serial Number: 211001533

## 2021-12-27 NOTE — Progress Notes (Signed)
Wound check appointment. Steri-strips removed prior to OV. Wound without redness or edema. Incision edges approximated, wound well healed. Normal device function. Thresholds, sensing, and impedances consistent with implant measurements. Device programmed at chronic outputs due no new leads placed. Histogram distribution appropriate for patient and level of activity. No mode switches or ventricular arrhythmias noted. Patient educated about wound care, shock plan. ROV in 3 months with implanting physician.

## 2021-12-27 NOTE — Patient Instructions (Signed)
   After Your ICD (Implantable Cardiac Defibrillator)    Monitor your defibrillator site for redness, swelling, and drainage. Call the device clinic at [phone] if you experience these symptoms or fever/chills.  Your incision was closed with Steri-strips or staples:  You may shower 7 days after your procedure and wash your incision with soap and water. Avoid lotions, ointments, or perfumes over your incision until it is well-healed.  You may use a hot tub or a pool after your wound check appointment if the incision is completely closed.   Your ICD is designed to protect you from life threatening heart rhythms. Because of this, you may receive a shock.   1 shock with no symptoms:  Call the office during business hours. 1 shock with symptoms (chest pain, chest pressure, dizziness, lightheadedness, shortness of breath, overall feeling unwell):  Call 911. If you experience 2 or more shocks in 24 hours:  Call 911. If you receive a shock, you should not drive.  [place] DMV - no driving for 6 months if you receive appropriate therapy from your ICD.   ICD Alerts:  Some alerts are vibratory and others beep. These are NOT emergencies. Please call our office to let us know. If this occurs at night or on weekends, it can wait until the next business day. Send a remote transmission.  If your device is capable of reading fluid status (for heart failure), you will be offered monthly monitoring to review this with you.   Remote monitoring is used to monitor your ICD from home. This monitoring is scheduled every 91 days by our office. It allows us to keep an eye on the functioning of your device to ensure it is working properly. You will routinely see your Electrophysiologist annually (more often if necessary).  

## 2022-01-09 DIAGNOSIS — E1169 Type 2 diabetes mellitus with other specified complication: Secondary | ICD-10-CM | POA: Diagnosis not present

## 2022-01-09 DIAGNOSIS — E785 Hyperlipidemia, unspecified: Secondary | ICD-10-CM | POA: Diagnosis not present

## 2022-01-09 DIAGNOSIS — G2581 Restless legs syndrome: Secondary | ICD-10-CM | POA: Diagnosis not present

## 2022-01-09 DIAGNOSIS — Z950 Presence of cardiac pacemaker: Secondary | ICD-10-CM | POA: Diagnosis not present

## 2022-01-09 DIAGNOSIS — E291 Testicular hypofunction: Secondary | ICD-10-CM | POA: Diagnosis not present

## 2022-01-09 DIAGNOSIS — E538 Deficiency of other specified B group vitamins: Secondary | ICD-10-CM | POA: Diagnosis not present

## 2022-01-09 DIAGNOSIS — I10 Essential (primary) hypertension: Secondary | ICD-10-CM | POA: Diagnosis not present

## 2022-01-09 DIAGNOSIS — R208 Other disturbances of skin sensation: Secondary | ICD-10-CM | POA: Diagnosis not present

## 2022-01-09 DIAGNOSIS — K219 Gastro-esophageal reflux disease without esophagitis: Secondary | ICD-10-CM | POA: Diagnosis not present

## 2022-01-09 DIAGNOSIS — I2510 Atherosclerotic heart disease of native coronary artery without angina pectoris: Secondary | ICD-10-CM | POA: Diagnosis not present

## 2022-01-09 DIAGNOSIS — K5900 Constipation, unspecified: Secondary | ICD-10-CM | POA: Diagnosis not present

## 2022-01-09 DIAGNOSIS — G8929 Other chronic pain: Secondary | ICD-10-CM | POA: Diagnosis not present

## 2022-01-10 DIAGNOSIS — M47817 Spondylosis without myelopathy or radiculopathy, lumbosacral region: Secondary | ICD-10-CM | POA: Diagnosis not present

## 2022-01-18 ENCOUNTER — Telehealth: Payer: Self-pay | Admitting: Nurse Practitioner

## 2022-01-18 NOTE — Telephone Encounter (Signed)
   Pre-operative Risk Assessment    Patient Name: Walter [name]  DOB: 1945/11/19 MRN: [id number]     Request for Surgical Clearance    Procedure:   Dental cleaning and using the Cavetron  Date of Surgery:  Clearance 01/18/22                                 Surgeon:  Dr. Ronnald Ramp Surgeon's Group or Practice Name:  [facility] Phone number:  [phone] Fax number:  N/A   Type of Clearance Requested:   - Medical  - Pharmacy:  Hold Aspirin TBD by Cardiologist   Type of Anesthesia:  Not Indicated   Additional requests/questions:    [name]   01/18/2022, 2:47 PM

## 2022-01-22 NOTE — Telephone Encounter (Signed)
    Primary Cardiologist: Loralie Champagne, MD  Chart reviewed as part of pre-operative protocol coverage. Simple dental extractions are considered low risk procedures per guidelines and generally do not require any specific cardiac clearance. It is also generally accepted that for simple extractions and dental cleanings, there is no need to interrupt blood thinner therapy.   SBE prophylaxis is not required for the patient.  I will route this recommendation to the requesting party via Epic fax function and remove from pre-op pool.  Please call with questions.  Deberah Pelton, NP 01/22/2022, 11:08 AM

## 2022-01-22 NOTE — Telephone Encounter (Addendum)
Called requesting office and got fax number [phone]  Clearance letter faxed.

## 2022-01-23 DIAGNOSIS — M1990 Unspecified osteoarthritis, unspecified site: Secondary | ICD-10-CM | POA: Diagnosis not present

## 2022-01-23 DIAGNOSIS — G894 Chronic pain syndrome: Secondary | ICD-10-CM | POA: Diagnosis not present

## 2022-01-23 DIAGNOSIS — M5459 Other low back pain: Secondary | ICD-10-CM | POA: Diagnosis not present

## 2022-01-23 DIAGNOSIS — M25559 Pain in unspecified hip: Secondary | ICD-10-CM | POA: Diagnosis not present

## 2022-01-23 DIAGNOSIS — Z79891 Long term (current) use of opiate analgesic: Secondary | ICD-10-CM | POA: Diagnosis not present

## 2022-01-23 DIAGNOSIS — M25519 Pain in unspecified shoulder: Secondary | ICD-10-CM | POA: Diagnosis not present

## 2022-01-23 DIAGNOSIS — M47817 Spondylosis without myelopathy or radiculopathy, lumbosacral region: Secondary | ICD-10-CM | POA: Diagnosis not present

## 2022-01-23 DIAGNOSIS — M533 Sacrococcygeal disorders, not elsewhere classified: Secondary | ICD-10-CM | POA: Diagnosis not present

## 2022-01-23 DIAGNOSIS — M47818 Spondylosis without myelopathy or radiculopathy, sacral and sacrococcygeal region: Secondary | ICD-10-CM | POA: Diagnosis not present

## 2022-01-23 DIAGNOSIS — Z79899 Other long term (current) drug therapy: Secondary | ICD-10-CM | POA: Diagnosis not present

## 2022-01-23 DIAGNOSIS — M25569 Pain in unspecified knee: Secondary | ICD-10-CM | POA: Diagnosis not present

## 2022-01-23 DIAGNOSIS — M1710 Unilateral primary osteoarthritis, unspecified knee: Secondary | ICD-10-CM | POA: Diagnosis not present

## 2022-01-29 ENCOUNTER — Ambulatory Visit (INDEPENDENT_AMBULATORY_CARE_PROVIDER_SITE_OTHER): Payer: Medicare Other

## 2022-01-29 DIAGNOSIS — I5022 Chronic systolic (congestive) heart failure: Secondary | ICD-10-CM

## 2022-01-29 DIAGNOSIS — Z95810 Presence of automatic (implantable) cardiac defibrillator: Secondary | ICD-10-CM | POA: Diagnosis not present

## 2022-01-30 NOTE — Progress Notes (Signed)
EPIC Encounter for ICM Monitoring  Patient Name: Walter [name] is a 76 y.o. male Date: 01/30/2022 Primary Care Physican: London Pepper, MD Primary Cardiologist: Aundra Dubin Electrophysiologist: Marisa Sprinkles Pacing: 95%         10/04/2021 Weight: 242 lbs 01/30/2022 Weight: 243 lbs (239-241 lbs)  AT/AF Burden: 1.9%                           Spoke with patient and heart failure questions reviewed.  Pt reports weight gain of 2-3 lbs over the last couple of days.  He has not been strict with salt intake.    CorVue thoracic impedance suggesting possible fluid accumulation starting 11/10 but starting to trend closer to baseline.   Furosemide 20 mg Take 1 tablet (20 mg total) by mouth daily as needed.    Labs: 11/17/2021 Creatinine 1.39, BUN 13, Potassium 3.7, Sodium 137, GFR 52 11/10/2021 Creatinine 1.36, BUN 13, Potassium 4.2, Sodium 139, GFR 54  A complete set of results can be found in Results Review.   Recommendations:  Pt will take PRN Furosemide x 2 days as prescribed.     Follow-up plan: ICM clinic phone appointment on 03/05/2022.  91 day device clinic remote transmission 03/09/2022.     EP/Cardiology Office Visits:  03/06/2022 with Dr Quentin Ore.   Recall 05/16/2022 with Dr Aundra Dubin.  Copy of ICM check sent to Dr. Quentin Ore.   3 month ICM trend: 01/29/2022.    12-14 Month ICM trend:     Rosalene Billings, RN 01/30/2022 3:04 PM

## 2022-02-15 DIAGNOSIS — H40051 Ocular hypertension, right eye: Secondary | ICD-10-CM | POA: Diagnosis not present

## 2022-02-15 DIAGNOSIS — H2513 Age-related nuclear cataract, bilateral: Secondary | ICD-10-CM | POA: Diagnosis not present

## 2022-02-15 DIAGNOSIS — E119 Type 2 diabetes mellitus without complications: Secondary | ICD-10-CM | POA: Diagnosis not present

## 2022-02-21 DIAGNOSIS — M25569 Pain in unspecified knee: Secondary | ICD-10-CM | POA: Diagnosis not present

## 2022-02-21 DIAGNOSIS — G894 Chronic pain syndrome: Secondary | ICD-10-CM | POA: Diagnosis not present

## 2022-02-21 DIAGNOSIS — M533 Sacrococcygeal disorders, not elsewhere classified: Secondary | ICD-10-CM | POA: Diagnosis not present

## 2022-02-21 DIAGNOSIS — Z79891 Long term (current) use of opiate analgesic: Secondary | ICD-10-CM | POA: Diagnosis not present

## 2022-02-21 DIAGNOSIS — M47817 Spondylosis without myelopathy or radiculopathy, lumbosacral region: Secondary | ICD-10-CM | POA: Diagnosis not present

## 2022-02-21 DIAGNOSIS — M1990 Unspecified osteoarthritis, unspecified site: Secondary | ICD-10-CM | POA: Diagnosis not present

## 2022-02-21 DIAGNOSIS — M47818 Spondylosis without myelopathy or radiculopathy, sacral and sacrococcygeal region: Secondary | ICD-10-CM | POA: Diagnosis not present

## 2022-02-21 DIAGNOSIS — M5459 Other low back pain: Secondary | ICD-10-CM | POA: Diagnosis not present

## 2022-02-21 DIAGNOSIS — M25519 Pain in unspecified shoulder: Secondary | ICD-10-CM | POA: Diagnosis not present

## 2022-02-21 DIAGNOSIS — M25559 Pain in unspecified hip: Secondary | ICD-10-CM | POA: Diagnosis not present

## 2022-02-21 DIAGNOSIS — Z79899 Other long term (current) drug therapy: Secondary | ICD-10-CM | POA: Diagnosis not present

## 2022-02-21 DIAGNOSIS — M1710 Unilateral primary osteoarthritis, unspecified knee: Secondary | ICD-10-CM | POA: Diagnosis not present

## 2022-03-05 ENCOUNTER — Ambulatory Visit (INDEPENDENT_AMBULATORY_CARE_PROVIDER_SITE_OTHER): Payer: Medicare Other

## 2022-03-05 DIAGNOSIS — I5022 Chronic systolic (congestive) heart failure: Secondary | ICD-10-CM | POA: Diagnosis not present

## 2022-03-05 DIAGNOSIS — Z95810 Presence of automatic (implantable) cardiac defibrillator: Secondary | ICD-10-CM

## 2022-03-05 NOTE — Progress Notes (Unsigned)
Electrophysiology Office Follow up Visit Note:    Date:  03/05/2022   ID:  [name], DOB September 11, 1945, MRN [id number]  PCP:  London Pepper, MD  [name] Cardiologist:  Loralie Champagne, MD  [facility] Electrophysiologist:  Vickie Epley, MD    Interval History:    [name] is a 76 y.o. male who presents for a follow up visit. He had a CRT-D generator replacement 12/07/2021. His post generator replacement wound check showed stable device function.        Past Medical History:  Diagnosis Date   AICD (automatic cardioverter/defibrillator) present    Anxiety    Asbestosis ([name])    mild   Atrial fibrillation (HCC)    Chronic low back pain    /sciatica- Dr Letta Median ([facility])   Complication of anesthesia 1996   previous surgery had a run of v-tach (for gallbladder-1990's)-cannot lie completely flat and panis when oxygen mask placed over face   Coronary artery disease, non-occlusive June 2011   Cardiac cath in [name] following abnormal Myoview   Delirium 05/06/2019   DJD (degenerative joint disease)    /osteoarthritis   Dyslipidemia, goal LDL below 100    On lovastatin (myalgias with Zocor and Lipitor)   Dyspnea    worse in hot weather   Dysrhythmia    LBBB, in 2015-atrial fibrillation   Essential hypertension    Factor V Leiden [name])    sees Dr. Cherrie Gauze Marrow- no problems with this   Frozen shoulder    right   GERD (gastroesophageal reflux disease)    H/O asbestosis    when in [employer]-1964-1970,1971-1986 in [employer]-diagnosed in 1990   History of kidney stones    Hypogonadism male    Irritable bowel    Kidney stone    Left bundle branch block (LBBB) on electrocardiogram    Diagnosed close to 20 years ago   Myocardial infarction [name])    Non-ischemic cardiomyopathy ([name]) June 2011   EF 45-50%; suspect related to LBBB; repeat Echo March 2015: EF 50- 55%   Obesity    PONV (postoperative nausea and vomiting)    Restless  leg syndrome    Rotator cuff arthropathy of right shoulder 05/14/2017   Sleep apnea    Testicular cancer ([name]) 1972   left   Tremor, essential 12/09/2015   Type 2 diabetes mellitus without complication ([name]) 07/6254   Type 2.    Wears glasses     Past Surgical History:  Procedure Laterality Date   APPENDECTOMY N/A    BI-VENTRICULAR IMPLANTABLE CARDIOVERTER DEFIBRILLATOR N/A 02/04/2014   SJM Quadra Assura BiV ICD implanted for primary prevention by [name]   BIV ICD GENERATOR CHANGEOUT N/A 12/07/2021   Procedure: BIV ICD GENERATOR CHANGEOUT;  Surgeon: Vickie Epley, MD;  Location: [name];  Service: Cardiovascular;  Laterality: N/A;   CARDIAC CATHETERIZATION  June 2011   Nonobstructive CAD   CARDIAC DEFIBRILLATOR PLACEMENT  02/04/14   [employer] [place] model [place] 3365-40Q (serial  Number [id number]) biventricular ICD.   CARDIOVERSION N/A 10/13/2013   Procedure: CARDIOVERSION;  Surgeon: Larey Dresser, MD;  Location: [name];  Service: Cardiovascular;  Laterality: N/A;   CARDIOVERSION  10-16-2013   DCCV by [name] following intracardiac echo to rule out LAA thrombus   CARDIOVERSION N/A 10/16/2013   Procedure: CARDIOVERSION;  Surgeon: Coralyn Mark, MD;  Location: [name];  Service: Cardiovascular;  Laterality: N/A;  CHOLECYSTECTOMY OPEN     COLONOSCOPY WITH PROPOFOL N/A 12/04/2019   Procedure: COLONOSCOPY WITH PROPOFOL;  Surgeon: Wonda Horner, MD;  Location: [name];  Service: [name];  Laterality: N/A;   ELECTROPHYSIOLOGY STUDY N/A 10/16/2013   Procedure: ELECTROPHYSIOLOGY STUDY;  Surgeon: Coralyn Mark, MD;  Location: [name];  Service: Cardiovascular;  Laterality: N/A;   ESOPHAGOGASTRODUODENOSCOPY N/A 10/15/2013   Procedure: ESOPHAGOGASTRODUODENOSCOPY (EGD);  Surgeon: Gatha Mayer, MD;  Location: [name];  Service: [name];  Laterality: N/A;  bedside   HERNIA REPAIR     KNEE ARTHROSCOPY Left    LEFT AND RIGHT HEART CATHETERIZATION  WITH CORONARY ANGIOGRAM N/A 10/12/2013   Procedure: LEFT AND RIGHT HEART CATHETERIZATION WITH CORONARY ANGIOGRAM;  Surgeon: Troy Sine, MD;  [name];  Service: Cardiovascular;  Laterality: N/A;   lymph nodes     RADIOFREQUENCY ABLATION NERVES     Back every 6 months   REVERSE SHOULDER ARTHROPLASTY Right 05/14/2017   Procedure: TOTAL REVERSE SHOULDER ARTHROPLASTY;  Surgeon: Marchia Bond, MD;  Location: [name];  Service: Orthopedics;  Laterality: Right;   ROTATOR CUFF REPAIR Right    SURGERY SCROTAL / TESTICULAR Left    TONSILLECTOMY Bilateral    TOTAL HIP ARTHROPLASTY Right 02/22/2015   Procedure: RIGHT TOTAL HIP ARTHROPLASTY ANTERIOR APPROACH;  Surgeon: Renette Butters, MD;  Location: [name];  Service: Orthopedics;  Laterality: Right;   TOTAL HIP ARTHROPLASTY Left 05/26/2019   Procedure: TOTAL HIP ARTHROPLASTY;  Surgeon: Marchia Bond, MD;  Location: [name];  Service: Orthopedics;  Laterality: Left;   TRANSTHORACIC ECHOCARDIOGRAM  May 2011   EF 45% with diffuse hypokinesis; septal bounce from LBBB   TRANSTHORACIC ECHOCARDIOGRAM  March 2015   EF 50-55%. Mild concentric LVH. Septal dyssynergy from LBBB     Current Medications: No outpatient medications have been marked as taking for the 03/06/22 encounter (Appointment) with Vickie Epley, MD.     Allergies:   Statins and Cortisone   Social History   Socioeconomic History   Marital status: Married    Spouse name: Not on file   Number of children: 2   Years of education: 14   Highest education level: Not on file  Occupational History   Occupation: Retired  Tobacco Use   Smoking status: Former    Types: Cigarettes    Quit date: 02/10/1973    Years since quitting: 49.0   Smokeless tobacco: Never  Vaping Use   Vaping Use: Never used  Substance and Sexual Activity   Alcohol use: Yes    Comment: rare   Drug use: No   Sexual activity: Not on file    Comment: Married  Other Topics Concern   Not on file   Social History Narrative   Lives at home w/ his wife, daughter and granddaughter   Left-handed   Caffeine: 1-2 diet sodas per day   Social Determinants of Health   Financial Resource Strain: Not on file  Food Insecurity: Not on file  Transportation Needs: Not on file  Physical Activity: Not on file  Stress: Not on file  Social Connections: Not on file     Family History: The patient's family history includes Heart attack in his maternal uncle; Heart disease in his father, mother, and paternal uncle; Hypertension in his brother, brother, and mother.  ROS:   Please see the history of present illness.    All other systems reviewed and are negative.  EKGs/Labs/Other Studies Reviewed:  The following studies were reviewed today:  03/06/2022 in clinic device interrogation personally reviewed ***    Recent Labs: 11/17/2021: BUN 13; Creatinine, Ser 1.39; Hemoglobin 19.4; Platelets 177; Potassium 3.7; Sodium 137  Recent Lipid Panel    Component Value Date/Time   CHOL 139 11/17/2021 1035   TRIG 141 11/17/2021 1035   HDL 34 (L) 11/17/2021 1035   CHOLHDL 4.1 11/17/2021 1035   VLDL 28 11/17/2021 1035   LDLCALC 77 11/17/2021 1035   LDLDIRECT 81.7 12/21/2013 0946    Physical Exam:    VS:  There were no vitals taken for this visit.    Wt Readings from Last 3 Encounters:  12/07/21 242 lb 4.8 oz (109.9 kg)  11/17/21 243 lb 12.8 oz (110.6 kg)  11/10/21 243 lb 9.6 oz (110.5 kg)     GEN: *** Well nourished, well developed in no acute distress HEENT: Normal NECK: No JVD; No carotid bruits LYMPHATICS: No lymphadenopathy CARDIAC: ***RRR, no murmurs, rubs, gallops. Prepectoral pocket well healed. RESPIRATORY:  Clear to auscultation without rales, wheezing or rhonchi  ABDOMEN: Soft, non-tender, non-distended MUSCULOSKELETAL:  No edema; No deformity  SKIN: Warm and dry NEUROLOGIC:  Alert and oriented x 3 PSYCHIATRIC:  Normal affect        ASSESSMENT:    No diagnosis  found. PLAN:    In order of problems listed above:   #Chronic systolic HF #CRT-D in situ NYHA II, warm and dry. Cont GDMT  CRT functioning appropriately. Continue remote monitoring.   Follow up 1 year with APP.        Total time spent with patient today *** minutes. This includes reviewing records, evaluating the patient and coordinating care.   Medication Adjustments/Labs and Tests Ordered: Current medicines are reviewed at length with the patient today.  Concerns regarding medicines are outlined above.  No orders of the defined types were placed in this encounter.  No orders of the defined types were placed in this encounter.    Signed, Lars Mage, MD, [facility], [name] 03/05/2022 10:26 PM    Electrophysiology [facility]

## 2022-03-05 NOTE — Progress Notes (Signed)
EPIC Encounter for ICM Monitoring  Patient Name: [name] is a 76 y.o. male Date: 03/05/2022 Primary Care Physican: London Pepper, MD Primary Cardiologist: Aundra Dubin Electrophysiologist: Marisa Sprinkles Pacing: 97%         10/04/2021 Weight: 242 lbs 01/30/2022 Weight: 243 lbs (239-241 lbs) 03/05/2022 Weight: 235 lbs   AT/AF Burden: <1%                           Spoke with patient and heart failure questions reviewed.  Pt reports he took PRN Lasix about 2 days ago and advised small amount of fluid returned on 12/17.    CorVue thoracic impedance suggesting possible fluid accumulation starting 12/12 but trending closer to baseline.   Furosemide 20 mg Take 1 tablet (20 mg total) by mouth daily as needed.    Labs: 11/17/2021 Creatinine 1.39, BUN 13, Potassium 3.7, Sodium 137, GFR 52 11/10/2021 Creatinine 1.36, BUN 13, Potassium 4.2, Sodium 139, GFR 54  A complete set of results can be found in Results Review.   Recommendations:  He will take PRN Lasix today and confirmed OV with Dr Quentin Ore tomorrow, 12/19.   Follow-up plan: ICM clinic phone appointment on 04/09/2022.  91 day device clinic remote transmission 06/08/2022.     EP/Cardiology Office Visits:  03/06/2022 with Dr Quentin Ore.   Recall 05/16/2022 with Dr Aundra Dubin.   Copy of ICM check sent to Dr. Quentin Ore.  3 month ICM trend: 03/05/2022.    12-14 Month ICM trend:     Rosalene Billings, RN 03/05/2022 12:52 PM

## 2022-03-06 ENCOUNTER — Ambulatory Visit: Payer: Medicare Other | Attending: Cardiology | Admitting: Cardiology

## 2022-03-06 ENCOUNTER — Encounter: Payer: Self-pay | Admitting: Cardiology

## 2022-03-06 VITALS — BP 136/82 | HR 71 | Ht 72.0 in | Wt 234.0 lb

## 2022-03-06 DIAGNOSIS — I10 Essential (primary) hypertension: Secondary | ICD-10-CM

## 2022-03-06 DIAGNOSIS — Z95810 Presence of automatic (implantable) cardiac defibrillator: Secondary | ICD-10-CM | POA: Diagnosis not present

## 2022-03-06 DIAGNOSIS — I5022 Chronic systolic (congestive) heart failure: Secondary | ICD-10-CM

## 2022-03-06 NOTE — Progress Notes (Signed)
Electrophysiology Office Follow up Visit Note:    Date:  03/06/2022   ID:  NYKO GELL, DOB [birth year], MRN [id number]  PCP:  London Pepper, MD  [facility] Cardiologist:  Loralie Champagne, MD  [facility] Electrophysiologist:  Vickie Epley, MD    Interval History:    [name] is a 76 y.o. male who presents for a follow up visit. He had a CRT-D generator replacement 12/07/2021. His post generator replacement wound check showed stable device function.  Today, he is feeling good. Some days his energy "is up or it is down".  He reports sporadic episodes of a mild burning sensation in his chest. This may last for 10 seconds at a time. Within a month, the chest discomfort may occur twice. At first he thought it was related to his device, but he has been unable to pinpoint any triggers.  He denies any palpitations, shortness of breath, or peripheral edema. No lightheadedness, headaches, syncope, orthopnea, or PND.      Past Medical History:  Diagnosis Date   AICD (automatic cardioverter/defibrillator) present    Anxiety    Asbestosis ([place])    mild   Atrial fibrillation (HCC)    Chronic low back pain    /sciatica- Dr Letta Median ([facility])   Complication of anesthesia 1996   previous surgery had a run of v-tach (for gallbladder-1990's)-cannot lie completely flat and panis when oxygen mask placed over face   Coronary artery disease, non-occlusive June 2011   Cardiac cath in [facility] following abnormal Myoview   Delirium 05/06/2019   DJD (degenerative joint disease)    /osteoarthritis   Dyslipidemia, goal LDL below 100    On lovastatin (myalgias with Zocor and Lipitor)   Dyspnea    worse in hot weather   Dysrhythmia    LBBB, in 2015-atrial fibrillation   Essential hypertension    Factor V Leiden [facility])    sees Dr. Cherrie Gauze Marrow- no problems with this   Frozen shoulder    right   GERD (gastroesophageal reflux disease)    H/O asbestosis     when in [employer]-1964-1970,1971-1986 in [employer]-diagnosed in 1990   History of kidney stones    Hypogonadism male    Irritable bowel    Kidney stone    Left bundle branch block (LBBB) on electrocardiogram    Diagnosed close to 20 years ago   Myocardial infarction [facility])    Non-ischemic cardiomyopathy ([place]) June 2011   EF 45-50%; suspect related to LBBB; repeat Echo March 2015: EF 50- 55%   Obesity    PONV (postoperative nausea and vomiting)    Restless leg syndrome    Rotator cuff arthropathy of right shoulder 05/14/2017   Sleep apnea    Testicular cancer ([place]) 1972   left   Tremor, essential 12/09/2015   Type 2 diabetes mellitus without complication ([place]) 10/1273   Type 2.    Wears glasses     Past Surgical History:  Procedure Laterality Date   APPENDECTOMY N/A    BI-VENTRICULAR IMPLANTABLE CARDIOVERTER DEFIBRILLATOR N/A 02/04/2014   SJM Quadra [employer]ura BiV ICD implanted for primary prevention by [name]   BIV ICD GENERATOR CHANGEOUT N/A 12/07/2021   Procedure: BIV ICD GENERATOR CHANGEOUT;  Surgeon: Vickie Epley, MD;  Location: [place] CV LAB;  Service: Cardiovascular;  Laterality: N/A;   CARDIAC CATHETERIZATION  June 2011   Nonobstructive CAD   CARDIAC DEFIBRILLATOR PLACEMENT  02/04/14   [place] CD [id number] (serial  Number [id number]) biventricular ICD.   CARDIOVERSION N/A 10/13/2013   Procedure: CARDIOVERSION;  Surgeon: Larey Dresser, MD;  Location: [place];  Service: Cardiovascular;  Laterality: N/A;   CARDIOVERSION  10-16-2013   DCCV by [name] following intracardiac echo to rule out LAA thrombus   CARDIOVERSION N/A 10/16/2013   Procedure: CARDIOVERSION;  Surgeon: Coralyn Mark, MD;  Location: [facility] CATH LAB;  Service: Cardiovascular;  Laterality: N/A;   CHOLECYSTECTOMY OPEN     COLONOSCOPY WITH PROPOFOL N/A 12/04/2019   Procedure: COLONOSCOPY WITH PROPOFOL;  Surgeon: Wonda Horner, MD;  Location: [facility] ENDOSCOPY;  Service: Endoscopy;   Laterality: N/A;   ELECTROPHYSIOLOGY STUDY N/A 10/16/2013   Procedure: ELECTROPHYSIOLOGY STUDY;  Surgeon: Coralyn Mark, MD;  Location: [place] CATH LAB;  Service: Cardiovascular;  Laterality: N/A;   ESOPHAGOGASTRODUODENOSCOPY N/A 10/15/2013   Procedure: ESOPHAGOGASTRODUODENOSCOPY (EGD);  Surgeon: Gatha Mayer, MD;  Location: [facility] ENDOSCOPY;  Service: Endoscopy;  Laterality: N/A;  bedside   HERNIA REPAIR     KNEE ARTHROSCOPY Left    LEFT AND RIGHT HEART CATHETERIZATION WITH CORONARY ANGIOGRAM N/A 10/12/2013   Procedure: LEFT AND RIGHT HEART CATHETERIZATION WITH CORONARY ANGIOGRAM;  Surgeon: Troy Sine, MD;  Location: [facility] CATH LAB;  Service: Cardiovascular;  Laterality: N/A;   lymph nodes     RADIOFREQUENCY ABLATION NERVES     Back every 6 months   REVERSE SHOULDER ARTHROPLASTY Right 05/14/2017   Procedure: TOTAL REVERSE SHOULDER ARTHROPLASTY;  Surgeon: Marchia Bond, MD;  Location: [place];  Service: Orthopedics;  Laterality: Right;   ROTATOR CUFF REPAIR Right    SURGERY SCROTAL / TESTICULAR Left    TONSILLECTOMY Bilateral    TOTAL HIP ARTHROPLASTY Right 02/22/2015   Procedure: RIGHT TOTAL HIP ARTHROPLASTY ANTERIOR APPROACH;  Surgeon: Renette Butters, MD;  Location: [place];  Service: Orthopedics;  Laterality: Right;   TOTAL HIP ARTHROPLASTY Left 05/26/2019   Procedure: TOTAL HIP ARTHROPLASTY;  Surgeon: Marchia Bond, MD;  Location: [facility];  Service: Orthopedics;  Laterality: Left;   TRANSTHORACIC ECHOCARDIOGRAM  May 2011   EF 45% with diffuse hypokinesis; septal bounce from LBBB   TRANSTHORACIC ECHOCARDIOGRAM  March 2015   EF 50-55%. Mild concentric LVH. Septal dyssynergy from LBBB     Current Medications: Current Meds  Medication Sig   albuterol (PROVENTIL HFA;VENTOLIN HFA) 108 (90 Base) MCG/ACT inhaler Inhale 1-2 puffs into the lungs every 6 (six) hours as needed for wheezing or shortness of breath.   apixaban (ELIQUIS) 5 MG TABS tablet TAKE 1 TABLET TWICE A DAY (NEED FOLLOW UP APPOINTMENT  FOR ANY MORE REFILLS)   carvedilol (COREG) 12.5 MG tablet TAKE 1 TABLET TWICE A DAY   cholecalciferol (VITAMIN D) 25 MCG (1000 UNIT) tablet Take 1,000 Units by mouth daily.   Cyanocobalamin (VITAMIN B-12) 2500 MCG SUBL Place 2,500 mcg under the tongue in the morning.   empagliflozin (JARDIANCE) 10 MG TABS tablet Take 10 mg by mouth in the morning.   esomeprazole (NEXIUM) 40 MG capsule Take 40 mg by mouth daily before breakfast.   furosemide (LASIX) 20 MG tablet Take 1 tablet (20 mg total) by mouth as needed.   gabapentin (NEURONTIN) 100 MG capsule Take 100 mg by mouth daily.   gabapentin (NEURONTIN) 300 MG capsule Take 300 mg by mouth at bedtime.   JARDIANCE 25 MG TABS tablet Take 25 mg by mouth daily.   lisinopril (ZESTRIL) 10 MG tablet TAKE 1 TABLET EVERY EVENING   lovastatin (  ALTOPREV) 40 MG 24 hr tablet Take 40 mg by mouth at bedtime.   melatonin 5 MG TABS Take 15-20 mg by mouth at bedtime as needed (Sleep).   metFORMIN (GLUCOPHAGE) 500 MG tablet Take 1,000 mg by mouth in the morning and at bedtime.   metFORMIN (GLUCOPHAGE-XR) 500 MG 24 hr tablet Take 1,000 mg by mouth in the morning and at bedtime.   morphine (MS CONTIN) 30 MG 12 hr tablet Take 30 mg by mouth every 12 (twelve) hours.   omega-3 acid ethyl esters (LOVAZA) 1 G capsule Take 1 g by mouth 2 (two) times daily.    ondansetron (ZOFRAN ODT) 4 MG disintegrating tablet Take 1 tablet (4 mg total) by mouth every 8 (eight) hours as needed for nausea or vomiting.   rOPINIRole (REQUIP) 1 MG tablet Take 1 mg by mouth at bedtime.    sildenafil (VIAGRA) 100 MG tablet Take 100 mg by mouth daily as needed for erectile dysfunction.   spironolactone (ALDACTONE) 25 MG tablet TAKE 1 TABLET EVERY MORNING (NEED FOLLOW UP APPOINTMENT FOR ANYMORE REFILLS)   testosterone cypionate (DEPOTESTOSTERONE CYPIONATE) 200 MG/ML injection Inject 200 mg into the muscle See admin instructions. Inject (200 mg) intramuscularly every 10 days.   tiZANidine (ZANAFLEX) 4  MG tablet Take 4 mg by mouth daily as needed for muscle spasms.     Allergies:   Statins and Cortisone   Social History   Socioeconomic History   Marital status: Married    Spouse name: Not on file   Number of children: 2   Years of education: 14   Highest education level: Not on file  Occupational History   Occupation: Retired  Tobacco Use   Smoking status: Former    Types: Cigarettes    Quit date: 02/10/1973    Years since quitting: 49.0   Smokeless tobacco: Never  Vaping Use   Vaping Use: Never used  Substance and Sexual Activity   Alcohol use: Yes    Comment: rare   Drug use: No   Sexual activity: Not on file    Comment: Married  Other Topics Concern   Not on file  Social History Narrative   Lives at home w/ his wife, daughter and granddaughter   Left-handed   Caffeine: 1-2 diet sodas per day   Social Determinants of Health   Financial Resource Strain: Not on file  Food Insecurity: Not on file  Transportation Needs: Not on file  Physical Activity: Not on file  Stress: Not on file  Social Connections: Not on file     Family History: The patient's family history includes Heart attack in his maternal uncle; Heart disease in his father, mother, and paternal uncle; Hypertension in his brother, brother, and mother.  ROS:   Please see the history of present illness.    (+) Chest discomfort. All other systems reviewed and are negative.  EKGs/Labs/Other Studies Reviewed:    The following studies were reviewed today:  03/06/2022 in clinic device interrogation personally reviewed Battery longevity 7.1 years Lead parameters stable No HV therapies 9 AMS episodes, longest 12hr 79mn     Recent Labs: 11/17/2021: BUN 13; Creatinine, Ser 1.39; Hemoglobin 19.4; Platelets 177; Potassium 3.7; Sodium 137   Recent Lipid Panel    Component Value Date/Time   CHOL 139 11/17/2021 1035   TRIG 141 11/17/2021 1035   HDL 34 (L) 11/17/2021 1035   CHOLHDL 4.1 11/17/2021  1035   VLDL 28 11/17/2021 1035   LDLCALC 77 11/17/2021 1035  LDLDIRECT 81.7 12/21/2013 0946    Physical Exam:    VS:  BP 136/82 (BP Location: Left Arm, Patient Position: Sitting, Cuff Size: Normal)   Pulse 71   Ht 6' (1.829 m)   Wt 234 lb (106.1 kg)   SpO2 97%   BMI 31.74 kg/m     Wt Readings from Last 3 Encounters:  03/06/22 234 lb (106.1 kg)  12/07/21 242 lb 4.8 oz (109.9 kg)  11/17/21 243 lb 12.8 oz (110.6 kg)     GEN: Well nourished, well developed in no acute distress HEENT: Normal NECK: No JVD; No carotid bruits LYMPHATICS: No lymphadenopathy CARDIAC: RRR, no murmurs, rubs, gallops. Prepectoral pocket well healed. RESPIRATORY:  Clear to auscultation without rales, wheezing or rhonchi  ABDOMEN: Soft, non-tender, non-distended MUSCULOSKELETAL:  No edema; No deformity  SKIN: Warm and dry NEUROLOGIC:  Alert and oriented x 3 PSYCHIATRIC:  Normal affect        ASSESSMENT:    1. Chronic systolic congestive heart failure ([place])   2. Biventricular implantable cardioverter-defibrillator in situ   3. Primary hypertension    PLAN:    In order of problems listed above:   #Chronic systolic HF #CRT-D in situ NYHA II, warm and dry. Cont GDMT  CRT functioning appropriately. Continue remote monitoring.  #Hypertension At goal today.  Recommend checking blood pressures 1-2 times per week at home and recording the values.  Recommend bringing these recordings to the primary care physician.   Follow up 1 year with APP.    Medication Adjustments/Labs and Tests Ordered: Current medicines are reviewed at length with the patient today.  Concerns regarding medicines are outlined above.   No orders of the defined types were placed in this encounter.  No orders of the defined types were placed in this encounter.  I,Mathew Stumpf,acting as a Education administrator for Vickie Epley, MD.,have documented all relevant documentation on the behalf of Vickie Epley, MD,as directed by   Vickie Epley, MD while in the presence of Vickie Epley, MD.  I, Vickie Epley, MD, have reviewed all documentation for this visit. The documentation on 03/06/22 for the exam, diagnosis, procedures, and orders are all accurate and complete.   Signed, Lars Mage, MD, [facility], [facility] 03/06/2022 3:30 PM    Electrophysiology [facility]

## 2022-03-09 ENCOUNTER — Ambulatory Visit (INDEPENDENT_AMBULATORY_CARE_PROVIDER_SITE_OTHER): Payer: Medicare Other

## 2022-03-09 DIAGNOSIS — I428 Other cardiomyopathies: Secondary | ICD-10-CM

## 2022-03-09 LAB — CUP PACEART REMOTE DEVICE CHECK
Battery Remaining Longevity: 84 mo
Battery Remaining Percentage: 93 %
Battery Voltage: 3.01 V
Brady Statistic AP VP Percent: 3.5 %
Brady Statistic AP VS Percent: 1 %
Brady Statistic AS VP Percent: 94 %
Brady Statistic AS VS Percent: 1.4 %
Brady Statistic RA Percent Paced: 3.9 %
Date Time Interrogation Session: 20231222020033
HighPow Impedance: 71 Ohm
Implantable Lead Connection Status: 753985
Implantable Lead Connection Status: 753985
Implantable Lead Connection Status: 753985
Implantable Lead Implant Date: 20151119
Implantable Lead Implant Date: 20151119
Implantable Lead Implant Date: 20151119
Implantable Lead Location: 753858
Implantable Lead Location: 753859
Implantable Lead Location: 753860
Implantable Pulse Generator Implant Date: 20230921
Lead Channel Impedance Value: 390 Ohm
Lead Channel Impedance Value: 450 Ohm
Lead Channel Impedance Value: 550 Ohm
Lead Channel Pacing Threshold Amplitude: 0.75 V
Lead Channel Pacing Threshold Amplitude: 1 V
Lead Channel Pacing Threshold Amplitude: 1 V
Lead Channel Pacing Threshold Pulse Width: 0.5 ms
Lead Channel Pacing Threshold Pulse Width: 0.5 ms
Lead Channel Pacing Threshold Pulse Width: 0.5 ms
Lead Channel Sensing Intrinsic Amplitude: 12 mV
Lead Channel Sensing Intrinsic Amplitude: 3.2 mV
Lead Channel Setting Pacing Amplitude: 1.5 V
Lead Channel Setting Pacing Amplitude: 2 V
Lead Channel Setting Pacing Amplitude: 2.5 V
Lead Channel Setting Pacing Pulse Width: 0.5 ms
Lead Channel Setting Pacing Pulse Width: 0.5 ms
Lead Channel Setting Sensing Sensitivity: 0.5 mV
Pulse Gen Serial Number: 211001533

## 2022-03-29 NOTE — Progress Notes (Signed)
Remote ICD transmission.   

## 2022-04-09 ENCOUNTER — Ambulatory Visit: Payer: Medicare Other | Attending: Cardiology

## 2022-04-16 NOTE — Progress Notes (Signed)
No ICM remote transmission received for 04/09/2022 and next ICM transmission scheduled for 04/23/2022.

## 2022-04-20 ENCOUNTER — Telehealth: Payer: Self-pay

## 2022-04-20 NOTE — Telephone Encounter (Signed)
Spoke with patient and advised device monitor is disconnected.  He has phone app. He was able to send remote transmission and monitor is now connected. Discussed remote transmission results.  He was on a cruise from 1/13-1/18.  Weight is stable at 235 lbs.  He is asymptomatic for fluid accumulation and feeling fine.  No changes today.  See 2/5 ICM note for further information.

## 2022-04-23 ENCOUNTER — Ambulatory Visit: Payer: Medicare Other | Attending: Cardiology

## 2022-04-23 DIAGNOSIS — Z95810 Presence of automatic (implantable) cardiac defibrillator: Secondary | ICD-10-CM | POA: Diagnosis not present

## 2022-04-23 DIAGNOSIS — I5022 Chronic systolic (congestive) heart failure: Secondary | ICD-10-CM | POA: Diagnosis not present

## 2022-04-24 NOTE — Progress Notes (Unsigned)
EPIC Encounter for ICM Monitoring  Patient Name: Walter [name] is a 77 y.o. male Date: 04/24/2022 Primary Care Physican: London Pepper, MD Primary Cardiologist: Aundra Dubin Electrophysiologist: Marisa Sprinkles Pacing: 97%         10/04/2021 Weight: 242 lbs 01/30/2022 Weight: 243 lbs (239-241 lbs) 03/05/2022 Weight: 235 lbs   AT/AF Burden: 0%                           Attempted call to patient and unable to reach.  Left detailed message per DPR regarding transmission. Transmission reviewed.    CorVue thoracic impedance normal but was suggesting possible fluid accumulation starting 1/20-1/31.   Furosemide 20 mg Take 1 tablet (20 mg total) by mouth daily as needed.    Labs: 11/17/2021 Creatinine 1.39, BUN 13, Potassium 3.7, Sodium 137, GFR 52 11/10/2021 Creatinine 1.36, BUN 13, Potassium 4.2, Sodium 139, GFR 54  A complete set of results can be found in Results Review.   Recommendations:  Left voice mail with ICM number and encouraged to call if experiencing any fluid symptoms.   Follow-up plan: ICM clinic phone appointment on 05/28/2022.  91 day device clinic remote transmission 06/08/2022.     EP/Cardiology Office Visits:  Recall 03/18/2023 with Tommye Standard, PA.  06/06/2022 with Dr Aundra Dubin.   Copy of ICM check sent to Dr. Quentin Ore.  3 month ICM trend: 04/23/2022.    12-14 Month ICM trend:     Rosalene Billings, RN 04/24/2022 8:09 AM

## 2022-04-25 ENCOUNTER — Telehealth: Payer: Self-pay

## 2022-04-25 NOTE — Telephone Encounter (Signed)
Remote ICM transmission received.  Attempted call to patient regarding ICM remote transmission and left detailed message per DPR.  Advised to return call for any fluid symptoms or questions. Next ICM remote transmission scheduled 05/28/2022.    

## 2022-05-25 ENCOUNTER — Encounter: Payer: Self-pay | Admitting: Cardiology

## 2022-05-28 ENCOUNTER — Ambulatory Visit: Payer: Medicare Other | Attending: Cardiology

## 2022-05-28 ENCOUNTER — Telehealth: Payer: Self-pay

## 2022-05-28 DIAGNOSIS — Z95810 Presence of automatic (implantable) cardiac defibrillator: Secondary | ICD-10-CM | POA: Diagnosis not present

## 2022-05-28 DIAGNOSIS — I5022 Chronic systolic (congestive) heart failure: Secondary | ICD-10-CM | POA: Diagnosis not present

## 2022-05-28 NOTE — Telephone Encounter (Signed)
Attempted ICM call to patient.  Left message to send manual remote transmission today for fluid level review.  Advised monitor is showing as disconnected and left call back number as well as [employer] tech support number for assistance.

## 2022-05-29 ENCOUNTER — Telehealth: Payer: Self-pay

## 2022-05-29 NOTE — Progress Notes (Signed)
EPIC Encounter for ICM Monitoring  Patient Name: Walter [name] is a 77 y.o. male Date: 05/29/2022 Primary Care Physican: London Pepper, MD Primary Cardiologist: Aundra Dubin Electrophysiologist: Marisa Sprinkles Pacing: 967%         10/04/2021 Weight: 242 lbs 01/30/2022 Weight: 243 lbs (239-241 lbs) 03/05/2022 Weight: 235 lbs   AT/AF Burden: <1%                           Attempted call to patient and unable to reach.  Left detailed message per DPR regarding transmission. Transmission reviewed.    CorVue thoracic impedance normal but was suggesting possible fluid accumulation starting 2/23-3/3.   Furosemide 20 mg Take 1 tablet (20 mg total) by mouth daily as needed.    Labs: 11/17/2021 Creatinine 1.39, BUN 13, Potassium 3.7, Sodium 137, GFR 52 11/10/2021 Creatinine 1.36, BUN 13, Potassium 4.2, Sodium 139, GFR 54  A complete set of results can be found in Results Review.   Recommendations:  Left voice mail with ICM number and encouraged to call if experiencing any fluid symptoms.   Follow-up plan: ICM clinic phone appointment on 07/02/2022.  91 day device clinic remote transmission 06/08/2022.     EP/Cardiology Office Visits:  Recall 03/18/2023 with Tommye Standard, PA.  06/06/2022 with Dr Aundra Dubin.   Copy of ICM check sent to Dr. Quentin Ore.  3 month ICM trend: 05/28/2022.    12-14 Month ICM trend:     Rosalene Billings, RN 05/29/2022 10:33 AM

## 2022-05-29 NOTE — Telephone Encounter (Signed)
Remote ICM transmission received.  Attempted call to patient regarding ICM remote transmission and left detailed message per DPR.  Advised to return call for any fluid symptoms or questions. Next ICM remote transmission scheduled 07/02/2022.

## 2022-06-06 ENCOUNTER — Encounter (HOSPITAL_COMMUNITY): Payer: Medicare Other | Admitting: Cardiology

## 2022-06-08 ENCOUNTER — Ambulatory Visit (INDEPENDENT_AMBULATORY_CARE_PROVIDER_SITE_OTHER): Payer: Medicare Other

## 2022-06-08 DIAGNOSIS — I428 Other cardiomyopathies: Secondary | ICD-10-CM

## 2022-06-08 LAB — CUP PACEART REMOTE DEVICE CHECK
Battery Remaining Longevity: 82 mo
Battery Remaining Percentage: 91 %
Battery Voltage: 2.99 V
Brady Statistic AP VP Percent: 9.2 %
Brady Statistic AP VS Percent: 1.2 %
Brady Statistic AS VP Percent: 87 %
Brady Statistic AS VS Percent: 2.7 %
Brady Statistic RA Percent Paced: 9.7 %
Date Time Interrogation Session: 20240322020051
HighPow Impedance: 73 Ohm
Implantable Lead Connection Status: 753985
Implantable Lead Connection Status: 753985
Implantable Lead Connection Status: 753985
Implantable Lead Implant Date: 20151119
Implantable Lead Implant Date: 20151119
Implantable Lead Implant Date: 20151119
Implantable Lead Location: 753858
Implantable Lead Location: 753859
Implantable Lead Location: 753860
Implantable Pulse Generator Implant Date: 20230921
Lead Channel Impedance Value: 390 Ohm
Lead Channel Impedance Value: 460 Ohm
Lead Channel Impedance Value: 530 Ohm
Lead Channel Pacing Threshold Amplitude: 0.75 V
Lead Channel Pacing Threshold Amplitude: 1 V
Lead Channel Pacing Threshold Amplitude: 1 V
Lead Channel Pacing Threshold Pulse Width: 0.5 ms
Lead Channel Pacing Threshold Pulse Width: 0.5 ms
Lead Channel Pacing Threshold Pulse Width: 0.5 ms
Lead Channel Sensing Intrinsic Amplitude: 12 mV
Lead Channel Sensing Intrinsic Amplitude: 3.3 mV
Lead Channel Setting Pacing Amplitude: 1.5 V
Lead Channel Setting Pacing Amplitude: 2 V
Lead Channel Setting Pacing Amplitude: 2.5 V
Lead Channel Setting Pacing Pulse Width: 0.5 ms
Lead Channel Setting Pacing Pulse Width: 0.5 ms
Lead Channel Setting Sensing Sensitivity: 0.5 mV
Pulse Gen Serial Number: 211001533

## 2022-06-12 ENCOUNTER — Ambulatory Visit: Payer: Medicare Other | Admitting: Physician Assistant

## 2022-07-02 ENCOUNTER — Ambulatory Visit: Payer: Medicare Other | Attending: Cardiology

## 2022-07-02 DIAGNOSIS — Z95810 Presence of automatic (implantable) cardiac defibrillator: Secondary | ICD-10-CM

## 2022-07-02 DIAGNOSIS — I5022 Chronic systolic (congestive) heart failure: Secondary | ICD-10-CM | POA: Diagnosis not present

## 2022-07-03 ENCOUNTER — Telehealth: Payer: Self-pay

## 2022-07-03 NOTE — Telephone Encounter (Signed)
Remote ICM transmission received.  Attempted call to patient regarding ICM remote transmission and left detailed message per DPR.  Advised to return call for any fluid symptoms or questions. Next ICM remote transmission scheduled 08/06/2022.    

## 2022-07-03 NOTE — Progress Notes (Signed)
EPIC Encounter for ICM Monitoring  Patient Name: Walter [name] is a 77 y.o. male Date: 07/03/2022 Primary Care Physican: Farris Has, MD Primary Cardiologist: Shirlee Latch Electrophysiologist: Townsend Roger Pacing: 96%         10/04/2021 Weight: 242 lbs 01/30/2022 Weight: 243 lbs (239-241 lbs) 03/05/2022 Weight: 235 lbs   AT/AF Burden: <1%                           Attempted call to patient and unable to reach.  Left detailed message per DPR regarding transmission. Transmission reviewed.    CorVue thoracic impedance normal but was suggesting possible fluid accumulation from 3/30-4/4.   Furosemide 20 mg Take 1 tablet (20 mg total) by mouth daily as needed.    Labs: 11/17/2021 Creatinine 1.39, BUN 13, Potassium 3.7, Sodium 137, GFR 52 11/10/2021 Creatinine 1.36, BUN 13, Potassium 4.2, Sodium 139, GFR 54  A complete set of results can be found in Results Review.   Recommendations:  Left voice mail with ICM number and encouraged to call if experiencing any fluid symptoms.   Follow-up plan: ICM clinic phone appointment on 08/06/2022.  91 day device clinic remote transmission 09/07/2022.     EP/Cardiology Office Visits:  Recall 03/18/2023 with Francis Dowse, PA.  08/07/2022 with Dr Shirlee Latch.   Copy of ICM check sent to Dr. Lalla Brothers.  3 month ICM trend: 07/02/2022.    12-14 Month ICM trend:     [name], RN 07/03/2022 4:31 PM

## 2022-07-10 NOTE — Progress Notes (Signed)
Remote ICD transmission.   

## 2022-07-14 ENCOUNTER — Other Ambulatory Visit (HOSPITAL_COMMUNITY): Payer: Self-pay | Admitting: Cardiology

## 2022-08-06 ENCOUNTER — Ambulatory Visit: Payer: Medicare Other | Attending: Cardiology

## 2022-08-06 ENCOUNTER — Telehealth: Payer: Self-pay

## 2022-08-06 DIAGNOSIS — Z95810 Presence of automatic (implantable) cardiac defibrillator: Secondary | ICD-10-CM | POA: Diagnosis not present

## 2022-08-06 DIAGNOSIS — I5022 Chronic systolic (congestive) heart failure: Secondary | ICD-10-CM | POA: Diagnosis not present

## 2022-08-06 NOTE — Telephone Encounter (Signed)
Remote ICM transmission received.  Attempted call to patient regarding ICM remote transmission and left detailed message per DPR.  Advised to return call for any fluid symptoms or questions. Next ICM remote transmission scheduled 08/14/2022.    

## 2022-08-06 NOTE — Progress Notes (Signed)
EPIC Encounter for ICM Monitoring  Patient Name: Walter [name] is a 77 y.o. male Date: 08/06/2022 Primary Care Physican: Farris Has, MD Primary Cardiologist: Shirlee Latch Electrophysiologist: Townsend Roger Pacing: 96%         10/04/2021 Weight: 242 lbs 01/30/2022 Weight: 243 lbs (239-241 lbs) 03/05/2022 Weight: 235 lbs   AT/AF Burden: <1%                           Attempted call to patient and unable to reach.  Left detailed message per DPR regarding transmission. Transmission reviewed.    CorVue thoracic impedance suggesting possible fluid accumulation starting 5/11 but trending back toward baseline.   Furosemide 20 mg Take 1 tablet (20 mg total) by mouth daily as needed.    Labs: 11/17/2021 Creatinine 1.39, BUN 13, Potassium 3.7, Sodium 137, GFR 52 11/10/2021 Creatinine 1.36, BUN 13, Potassium 4.2, Sodium 139, GFR 54  A complete set of results can be found in Results Review.   Recommendations:  Left voice mail with ICM number and encouraged to call if experiencing any fluid symptoms.   Follow-up plan: ICM clinic phone appointment on 08/14/2022 to recheck fluid levels.  91 day device clinic remote transmission 09/07/2022.     EP/Cardiology Office Visits:  Recall 03/18/2023 with Francis Dowse, PA.  08/07/2022 with Dr Shirlee Latch.   Copy of ICM check sent to Dr. Lalla Brothers.  3 month ICM trend: 08/06/2022.    12-14 Month ICM trend:     [name], RN 08/06/2022 8:01 AM

## 2022-08-07 ENCOUNTER — Encounter (HOSPITAL_COMMUNITY): Payer: Self-pay | Admitting: Cardiology

## 2022-08-07 ENCOUNTER — Ambulatory Visit (HOSPITAL_COMMUNITY)
Admission: RE | Admit: 2022-08-07 | Discharge: 2022-08-07 | Disposition: A | Discharge status: Home / Self Care | Payer: Medicare Other | Source: Ambulatory Visit | Attending: Cardiology | Admitting: Cardiology

## 2022-08-07 VITALS — BP 90/50 | HR 72 | Wt 234.8 lb

## 2022-08-07 DIAGNOSIS — Z87891 Personal history of nicotine dependence: Secondary | ICD-10-CM | POA: Insufficient documentation

## 2022-08-07 DIAGNOSIS — Z7984 Long term (current) use of oral hypoglycemic drugs: Secondary | ICD-10-CM | POA: Diagnosis not present

## 2022-08-07 DIAGNOSIS — I428 Other cardiomyopathies: Secondary | ICD-10-CM | POA: Insufficient documentation

## 2022-08-07 DIAGNOSIS — I110 Hypertensive heart disease with heart failure: Secondary | ICD-10-CM | POA: Diagnosis not present

## 2022-08-07 DIAGNOSIS — I480 Paroxysmal atrial fibrillation: Secondary | ICD-10-CM | POA: Insufficient documentation

## 2022-08-07 DIAGNOSIS — Z79899 Other long term (current) drug therapy: Secondary | ICD-10-CM | POA: Diagnosis not present

## 2022-08-07 DIAGNOSIS — I447 Left bundle-branch block, unspecified: Secondary | ICD-10-CM | POA: Insufficient documentation

## 2022-08-07 DIAGNOSIS — I519 Heart disease, unspecified: Secondary | ICD-10-CM | POA: Diagnosis not present

## 2022-08-07 DIAGNOSIS — I2510 Atherosclerotic heart disease of native coronary artery without angina pectoris: Secondary | ICD-10-CM | POA: Diagnosis not present

## 2022-08-07 DIAGNOSIS — Z7901 Long term (current) use of anticoagulants: Secondary | ICD-10-CM | POA: Diagnosis not present

## 2022-08-07 DIAGNOSIS — Z8249 Family history of ischemic heart disease and other diseases of the circulatory system: Secondary | ICD-10-CM | POA: Diagnosis not present

## 2022-08-07 DIAGNOSIS — E785 Hyperlipidemia, unspecified: Secondary | ICD-10-CM | POA: Insufficient documentation

## 2022-08-07 DIAGNOSIS — I5022 Chronic systolic (congestive) heart failure: Secondary | ICD-10-CM | POA: Diagnosis present

## 2022-08-07 LAB — BASIC METABOLIC PANEL
Anion gap: 9 (ref 5–15)
BUN: 18 mg/dL (ref 8–23)
CO2: 23 mmol/L (ref 22–32)
Calcium: 8.8 mg/dL — ABNORMAL LOW (ref 8.9–10.3)
Chloride: 103 mmol/L (ref 98–111)
Creatinine, Ser: 1.22 mg/dL (ref 0.61–1.24)
GFR, Estimated: >60 mL/min (ref >60)
Glucose, Bld: 160 mg/dL — ABNORMAL HIGH (ref 70–99)
Potassium: 4 mmol/L (ref 3.5–5.1)
Sodium: 135 mmol/L (ref 135–145)

## 2022-08-07 LAB — LIPID PANEL
Cholesterol: 139 mg/dL (ref 0–200)
HDL: 39 mg/dL — ABNORMAL LOW
LDL Cholesterol: 62 mg/dL (ref 0–99)
Total CHOL/HDL Ratio: 3.6 ratio
Triglycerides: 191 mg/dL — ABNORMAL HIGH
VLDL: 38 mg/dL (ref 0–40)

## 2022-08-07 LAB — CBC
HCT: 56.4 % — ABNORMAL HIGH (ref 39.0–52.0)
Hemoglobin: 18.5 g/dL — ABNORMAL HIGH (ref 13.0–17.0)
MCH: 30.9 pg (ref 26.0–34.0)
MCHC: 32.8 g/dL (ref 30.0–36.0)
MCV: 94.2 fL (ref 80.0–100.0)
Platelets: 165 10*3/uL (ref 150–400)
RBC: 5.99 MIL/uL — ABNORMAL HIGH (ref 4.22–5.81)
RDW: 14.1 % (ref 11.5–15.5)
WBC: 8.9 10*3/uL (ref 4.0–10.5)
nRBC: 0 % (ref 0.0–0.2)

## 2022-08-07 LAB — BRAIN NATRIURETIC PEPTIDE: B Natriuretic Peptide: 61 pg/mL (ref 0.0–100.0)

## 2022-08-07 MED ORDER — SPIRONOLACTONE 25 MG PO TABS: ORAL_TABLET | ORAL | 3 refills | Status: DC

## 2022-08-07 NOTE — Patient Instructions (Signed)
CHANGE Lasix to 20 mg daily for 3 days, then go back to as needed.  Labs done today, your results will be available in MyChart, we will contact you for abnormal readings.  Your physician has requested that you have an echocardiogram. Echocardiography is a painless test that uses sound waves to create images of your heart. It provides your doctor with information about the size and shape of your heart and how well your heart's chambers and valves are working. This procedure takes approximately one hour. There are no restrictions for this procedure. Please do NOT wear cologne, perfume, aftershave, or lotions (deodorant is allowed). Please arrive 15 minutes prior to your appointment time.  Your physician recommends that you schedule a follow-up appointment in: 4 months  If you have any questions or concerns before your next appointment please send [place] a message through [place] or call our office at [phone].    TO LEAVE A MESSAGE FOR THE NURSE SELECT OPTION 2, PLEASE LEAVE A MESSAGE INCLUDING: YOUR NAME DATE OF BIRTH CALL BACK NUMBER REASON FOR CALL**this is important as we prioritize the call backs  YOU WILL RECEIVE A CALL BACK THE SAME DAY AS LONG AS YOU CALL BEFORE 4:00 PM  At the [facility], you and your health needs are our priority. As part of our continuing mission to provide you with exceptional heart care, we have created designated Provider Care Teams. These Care Teams include your primary Cardiologist (physician) and Advanced Practice Providers (APPs- Physician Assistants and Nurse Practitioners) who all work together to provide you with the care you need, when you need it.   You may see any of the following providers on your designated Care Team at your next follow up: Dr Arvilla Meres Dr Marca Ancona Dr. Marcos Eke, NP Robbie Lis, [place] [facility] [place], [place] Brynda Peon, NP Karle Plumber, PharmD   Please be sure to  bring in all your medications bottles to every appointment.    Thank you for choosing [facility]

## 2022-08-08 NOTE — Progress Notes (Signed)
Date:  08/08/2022   ID:  Walter [name], DOB Oct 13, 1945, MRN [id number]   Provider location: [facility] Type of Visit: Established patient   PCP:  Farris Has, MD  Cardiologist:  Dr. Shirlee Latch   History of Present Illness: Walter [name] is a 77 y.o. male who has a history of paroxysmal atrial fibrillation, chronic LBBB and cardiomyopathy.  He was found to have LBBB 15-20 years ago.  In 2011, he had a stress test in [facility] that was abnormal so he was taken for [facility] in 6/11 that showed nonobstructive disease.  He had an echo in 5/11 that showed EF 45-50%, diffuse hypokinesis suggesting a mild nonischemic cardiomyopathy.    He has admitted in 7/15 with atrial fibrillation with RVR.  Rate was very difficult to control.  TEE failed due to inability to pass probe x 2.  Eventually, ICE was done to rule out LAA thrombus and he was cardioverted to NSR.  He is on amiodarone now and in NSR.  Echo in 7/15 showed EF 20% with diffuse hypokinesis in setting of atrial fibrillation/RVR.  LHC was also done in 7/15 with minimal coronary disease.  Repeat echo in 10/15 showed EF 20-25%.  In 11/15, he had St Jude CRT-D device placed.  Repeat echo in 4/16 showed EF improved to 50% with mild LVH. Repeat echo in 11/17 showed EF 50-55%, mildly dilated RV with normal systolic function.  Echo in 12/19 again showed EF 50-55% with moderate LVH.   Left THR in 3/21.   Echo (5/21): EF 55%, mild LVH, normal RV, PASP 31 mmHg.  Echo in 1/22 showed EF 55%, normal RV.    He returns for followup of CHF and paroxysmal atrial fibrillation.  Weight is down 9 lbs.  Patient has had some peripheral edema recently, reports increased dietary sodium for several days.  He is fatigued at times.  No palpitations.  He can walk up stairs, more limited by his back than by breathing.  No dyspnea walking on flat ground.   St Jude device interrogation: 97% BiV paced, no VT/AF, decreased thoracic impedance recently but  now heading up.     ECG (personally reviewed): NSR, BiV paced   Labs (11/14): K 3.9, creatinine 1.14, LDL 104, LFTs normal Labs (2/15): HCT 56.3 Labs (9/15): K 4.1, creatinine 1.1, digoxin 0.9, TSH normal, LFTs normal Labs (10/15): TGs 262, LDL 82, BNP 68 Labs (11/15): K 4.1, creatinine 1.12 Labs (12/15): K 4.8, creatinine 1.02 Labs (3/16): K 4.6, creatinine 1.06, digoxin 0.8, LFTs normal, TSH normal Labs (11/16): K 4.2, creatinine 1.17 => 1.3, HCT 56.2, TSH normal, LDL 75, HDL 34, LFTs normal Labs (4/69): K 4.4, creatinine 1.18, BNP 20, HCT 50.8, LFTs normal Labs (12/17): K 4.3, creatinine 1.4 Labs (1/18): K 4.8, creatinine 1.26, hgb 18.1, LFTs normal, TSH normal.  Labs (2/19): K 4, creatinine 1.5 Labs (11/19): LDL 58 Labs (5/20): K 4.3, creatinine 1.43 Labs (4/21): K 4.3, creatinine 1.3, TSH normal, LFTs normal Labs (8/23): K 4.2, creatinine 1.36 Labs (9/23): K 3.7, creatinien 1.39, LDL 77, hgb 19   PMH: 1. Type II diabetes 2. HTN 3. Adrenal nodule 4. Restless leg syndrome 5. OA with right hip pain, right frozen shoulder, low back pain. S/p R THR in 12/16.  S/p right shoulder replacement in 2/19. s/p left THR in 3/21.  6. Testicular cancer: 1972 7. Chronic LBBB: Had Cardiolite in 2011 that was abnormal so had LHC in 6/11 in  [place], [place].  This showed 30% LCx stenosis and 40% ostial RCA stenosis.  LHC in 7/15 in [place] showed minimal coronary disease.  8. 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   - Echo (5/11) with EF 45-50%, moderate LVH.  - Echo (7/15) with EF 20%, diffuse hypokinesis, mild to moderately decreased RV systolic function (in setting of afib with RVR).  - LHC (7/15) with minimal coronary disease.   - Echo (10/15) with EF 20-25%, mild LVH, moderate LAE.  - St Jude CRT-D device placed in 11/15.  - Echo (4/16) with EF 50%, mild LVH.  - Echo (11/17): EF 50-55%, mildly dilated RV with normal  systolic function.  - Echo (12/19): EF 50-55%, moderate LVH, mildly dilated RV with normal systolic function.  - Echo (5/21): EF 55%, mild LVH, RV normal, PASP 31 mmHg.  - Echo (1/22): EF 55%, normal RV.  9. Factor V Leiden + 10. Asbestosis: Mild.  Exposure while in the [employer].  11. GERD 12. PVCs 13. Polycythemia: Uncertain etiology 14. Hyperlipidemia: Myalgias with Zocor and Lipitor.  15. Atrial fibrillation: Paroxysmal.  Unable to pass TEE probe.  16. Tremor  Current Outpatient Medications  Medication Sig Dispense Refill   albuterol (PROVENTIL HFA;VENTOLIN HFA) 108 (90 Base) MCG/ACT inhaler Inhale 1-2 puffs into the lungs every 6 (six) hours as needed for wheezing or shortness of breath.     carvedilol (COREG) 12.5 MG tablet TAKE 1 TABLET TWICE A DAY 180 tablet 3   cholecalciferol (VITAMIN D) 25 MCG (1000 UNIT) tablet Take 1,000 Units by mouth daily.     Cyanocobalamin (VITAMIN B-12) 2500 MCG SUBL Place 2,500 mcg under the tongue in the morning.     ELIQUIS 5 MG TABS tablet TAKE 1 TABLET TWICE A DAY (NEED FOLLOW UP APPOINTMENT FOR ANY MORE REFILLS) 180 tablet 3   empagliflozin (JARDIANCE) 10 MG TABS tablet Take 10 mg by mouth in the morning.     esomeprazole (NEXIUM) 40 MG capsule Take 40 mg by mouth daily before breakfast.     furosemide (LASIX) 20 MG tablet Take 1 tablet (20 mg total) by mouth as needed. 45 tablet 3   gabapentin (NEURONTIN) 100 MG capsule Take 100 mg by mouth daily.     gabapentin (NEURONTIN) 300 MG capsule Take 300 mg by mouth at bedtime.     JARDIANCE 25 MG TABS tablet Take 25 mg by mouth daily.     lisinopril (ZESTRIL) 10 MG tablet TAKE 1 TABLET EVERY EVENING 90 tablet 3   lovastatin (ALTOPREV) 40 MG 24 hr tablet Take 40 mg by mouth at bedtime.     Magnesium Oxide 400 MG CAPS Take 1 capsule by mouth daily.     melatonin 5 MG TABS Take 15-20 mg by mouth at bedtime as needed (Sleep).     metFORMIN (GLUCOPHAGE) 500 MG tablet Take 1,000 mg by mouth in the morning  and at bedtime.     metFORMIN (GLUCOPHAGE-XR) 500 MG 24 hr tablet Take 1,000 mg by mouth in the morning and at bedtime.     morphine (MS CONTIN) 30 MG 12 hr tablet Take 30 mg by mouth every 12 (twelve) hours.     omega-3 acid ethyl esters (LOVAZA) 1 G capsule Take 1 g by mouth 2 (two) times daily.      ondansetron (ZOFRAN ODT) 4 MG disintegrating tablet Take 1 tablet (4 mg total) by mouth every 8 (eight) hours as needed for nausea or vomiting. 6 tablet 0  rOPINIRole (REQUIP) 1 MG tablet Take 1 mg by mouth at bedtime.      sildenafil (VIAGRA) 100 MG tablet Take 100 mg by mouth daily as needed for erectile dysfunction.     testosterone cypionate (DEPOTESTOSTERONE CYPIONATE) 200 MG/ML injection Inject 200 mg into the muscle See admin instructions. Inject (200 mg) intramuscularly every 10 days.     tiZANidine (ZANAFLEX) 4 MG tablet Take 4 mg by mouth daily as needed for muscle spasms.     spironolactone (ALDACTONE) 25 MG tablet TAKE 1 TABLET EVERY MORNING 90 tablet 3   No current facility-administered medications for this encounter.    Allergies:   Statins and Cortisone   Social History:  The patient  reports that he quit smoking about 49 years ago. His smoking use included cigarettes. He has never used smokeless tobacco. He reports current alcohol use. He reports that he does not use drugs.   Family History:  The patient's family history includes Heart attack in his maternal uncle; Heart disease in his father, mother, and paternal uncle; Hypertension in his brother, brother, and mother.   ROS:  Please see the history of present illness.   All other systems are personally reviewed and negative.   Exam:   BP (!) 90/50   Pulse 72   Wt 106.5 kg (234 lb 12.8 oz)   SpO2 92%   BMI 31.84 kg/m  General: NAD Neck: No JVD, no thyromegaly or thyroid nodule.  Lungs: Clear to auscultation bilaterally with normal respiratory effort. CV: Nondisplaced PMI.  Heart regular S1/S2, no S3/S4, no murmur.  1+  edema 1/3 to knees bilaterally.  No carotid bruit.  Normal pedal pulses.  Abdomen: Soft, nontender, no hepatosplenomegaly, no distention.  Skin: Intact without lesions or rashes.  Neurologic: Alert and oriented x 3.  Psych: Normal affect. Extremities: No clubbing or cyanosis.  HEENT: Normal.   Recent Labs: 08/07/2022: B Natriuretic Peptide 61.0; BUN 18; Creatinine, Ser 1.22; Hemoglobin 18.5; Platelets 165; Potassium 4.0; Sodium 135  Personally reviewed   Wt Readings from Last 3 Encounters:  08/07/22 106.5 kg (234 lb 12.8 oz)  03/06/22 106.1 kg (234 lb)  12/07/21 109.9 kg (242 lb 4.8 oz)      ASSESSMENT AND PLAN:  1. Cardiomyopathy: Nonischemic.  There was a pre-existing possible LBBB cardiomyopathy, but EF was down to 20% in the setting of atrial fibrillation with RVR in 7/15 (suggesting tachycardia-mediated cardiomyopathy).  However, EF did not improve in NSR (20-25% on repeat echo in 10/15).  Now with St Jude CRT-D device.  No significant coronary disease on 7/15 LHC. Since placement of CRT-D, EF has improved to 55% on last echo in 1/22.  NYHA class II symptoms.  Mild volume overload recently by Corvue though looks ok on exam.   - Continue current Coreg and lisinopril.  BMET today.   - He will take Lasix 20 mg daily x 3 days then back to prn use.   - Continue empagliflozin 10 mg daily.  - Restart spironolactone 25 mg daily.  - BMET/BNP - I will arrange for echo.  2. CAD: Nonobstructive, mild. No chest pain.  Taking lovastatin.  No aspirin as he is anticoagulated.  3. Hyperlipidemia: Tolerating lovastatin, had myalgias with other statins.    4. HTN: BP is actually on the low side today.  5. Atrial fibrillation: Paroxysmal.  He had tremor with amiodarone and is now off.  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  No recent AF on device interrogation.  - Continue Eliquis.  - If he has recurrent atrial fibrillation, would favor ablation  (versus dofetilide).    Followup in 4 months with echo  Signed, Marca Ancona, MD  08/08/2022   [facility] [address] [facility] [place] [address] [phone] (office) [phone] (fax)

## 2022-08-14 ENCOUNTER — Ambulatory Visit: Payer: Medicare Other | Attending: Cardiology

## 2022-08-14 DIAGNOSIS — Z95810 Presence of automatic (implantable) cardiac defibrillator: Secondary | ICD-10-CM

## 2022-08-14 DIAGNOSIS — I5022 Chronic systolic (congestive) heart failure: Secondary | ICD-10-CM

## 2022-08-14 NOTE — Progress Notes (Signed)
EPIC Encounter for ICM Monitoring  Patient Name: Walter [name] is a 77 y.o. male Date: 08/14/2022 Primary Care Physican: Farris Has, MD Primary Cardiologist: Shirlee Latch Electrophysiologist: Townsend Roger Pacing: 96%         10/04/2021 Weight: 242 lbs 01/30/2022 Weight: 243 lbs (239-241 lbs) 03/05/2022 Weight: 235 lbs   AT/AF Burden: <1%                           Transmission reviewed.    CorVue thoracic impedance suggesting possible fluid accumulation from 5/11-5/21 but returned to normal.   Furosemide 20 mg Take 1 tablet (20 mg total) by mouth daily as needed.    Labs: 08/07/2022 Creatinine 1.22, BUN 18, Potassium 4.0, Sodium 135, GFR >60  A complete set of results can be found in Results Review.   Recommendations:  No changes.     Follow-up plan: ICM clinic phone appointment on 09/10/2022.  91 day device clinic remote transmission 09/07/2022.     EP/Cardiology Office Visits:  Recall 03/18/2023 with Francis Dowse, PA.  12/07/2022 with HF clinic.   Copy of ICM check sent to Dr. Lalla Brothers.  3 month ICM trend: 08/14/2022.    12-14 Month ICM trend:     [name], RN 08/14/2022 8:56 AM

## 2022-08-20 ENCOUNTER — Encounter: Payer: Self-pay | Admitting: Cardiology

## 2022-08-29 ENCOUNTER — Ambulatory Visit (HOSPITAL_COMMUNITY)
Admission: RE | Admit: 2022-08-29 | Discharge: 2022-08-29 | Disposition: A | Discharge status: Home / Self Care | Payer: Medicare Other | Source: Ambulatory Visit | Attending: Family Medicine | Admitting: Family Medicine

## 2022-08-29 DIAGNOSIS — Z87891 Personal history of nicotine dependence: Secondary | ICD-10-CM | POA: Diagnosis not present

## 2022-08-29 DIAGNOSIS — I4510 Unspecified right bundle-branch block: Secondary | ICD-10-CM | POA: Diagnosis not present

## 2022-08-29 DIAGNOSIS — I110 Hypertensive heart disease with heart failure: Secondary | ICD-10-CM | POA: Insufficient documentation

## 2022-08-29 DIAGNOSIS — I4891 Unspecified atrial fibrillation: Secondary | ICD-10-CM | POA: Insufficient documentation

## 2022-08-29 DIAGNOSIS — E119 Type 2 diabetes mellitus without complications: Secondary | ICD-10-CM | POA: Insufficient documentation

## 2022-08-29 DIAGNOSIS — I429 Cardiomyopathy, unspecified: Secondary | ICD-10-CM | POA: Diagnosis not present

## 2022-08-29 DIAGNOSIS — I5022 Chronic systolic (congestive) heart failure: Secondary | ICD-10-CM | POA: Diagnosis present

## 2022-08-29 LAB — ECHOCARDIOGRAM COMPLETE
AR max vel: 3.16 cm2
AV Area VTI: 3 cm2
AV Area mean vel: 2.9 cm2
AV Mean grad: 3 mmHg
AV Peak grad: 4.9 mmHg
Ao pk vel: 1.11 m/s
Area-P 1/2: 1.77 cm2
Calc EF: 56.3 %
MV M vel: 1.88 m/s
MV Peak grad: 14.1 mmHg
S' Lateral: 4.4 cm
Single Plane A2C EF: 57 %
Single Plane A4C EF: 54.6 %

## 2022-08-30 ENCOUNTER — Telehealth (HOSPITAL_COMMUNITY): Payer: Self-pay

## 2022-08-30 NOTE — Telephone Encounter (Addendum)
Pt aware, agreeable, and verbalized understanding   ----- Message from Laurey Morale, MD sent at 08/30/2022  8:11 AM EDT ----- No major abnormalities, EF 55-60%

## 2022-09-07 ENCOUNTER — Ambulatory Visit: Payer: Medicare Other

## 2022-09-07 DIAGNOSIS — I428 Other cardiomyopathies: Secondary | ICD-10-CM | POA: Diagnosis not present

## 2022-09-07 DIAGNOSIS — I519 Heart disease, unspecified: Secondary | ICD-10-CM

## 2022-09-07 LAB — CUP PACEART REMOTE DEVICE CHECK
Battery Remaining Longevity: 79 mo
Battery Remaining Percentage: 87 %
Battery Voltage: 2.99 V
Brady Statistic AP VP Percent: 7.2 %
Brady Statistic AP VS Percent: 1 %
Brady Statistic AS VP Percent: 90 %
Brady Statistic AS VS Percent: 1.7 %
Brady Statistic RA Percent Paced: 7.8 %
Date Time Interrogation Session: 20240621020257
HighPow Impedance: 75 Ohm
Implantable Lead Connection Status: 753985
Implantable Lead Connection Status: 753985
Implantable Lead Connection Status: 753985
Implantable Lead Implant Date: 20151119
Implantable Lead Implant Date: 20151119
Implantable Lead Implant Date: 20151119
Implantable Lead Location: 753858
Implantable Lead Location: 753859
Implantable Lead Location: 753860
Implantable Pulse Generator Implant Date: 20230921
Lead Channel Impedance Value: 400 Ohm
Lead Channel Impedance Value: 460 Ohm
Lead Channel Impedance Value: 560 Ohm
Lead Channel Pacing Threshold Amplitude: 0.75 V
Lead Channel Pacing Threshold Amplitude: 1 V
Lead Channel Pacing Threshold Amplitude: 1 V
Lead Channel Pacing Threshold Pulse Width: 0.5 ms
Lead Channel Pacing Threshold Pulse Width: 0.5 ms
Lead Channel Pacing Threshold Pulse Width: 0.5 ms
Lead Channel Sensing Intrinsic Amplitude: 12 mV
Lead Channel Sensing Intrinsic Amplitude: 3.3 mV
Lead Channel Setting Pacing Amplitude: 1.5 V
Lead Channel Setting Pacing Amplitude: 2 V
Lead Channel Setting Pacing Amplitude: 2.5 V
Lead Channel Setting Pacing Pulse Width: 0.5 ms
Lead Channel Setting Pacing Pulse Width: 0.5 ms
Lead Channel Setting Sensing Sensitivity: 0.5 mV
Pulse Gen Serial Number: 211001533

## 2022-09-10 ENCOUNTER — Ambulatory Visit: Payer: Medicare Other | Attending: Cardiology

## 2022-09-10 DIAGNOSIS — I5022 Chronic systolic (congestive) heart failure: Secondary | ICD-10-CM | POA: Diagnosis not present

## 2022-09-10 DIAGNOSIS — Z95810 Presence of automatic (implantable) cardiac defibrillator: Secondary | ICD-10-CM | POA: Diagnosis not present

## 2022-09-12 ENCOUNTER — Telehealth: Payer: Self-pay

## 2022-09-12 NOTE — Progress Notes (Signed)
EPIC Encounter for ICM Monitoring  Patient Name: Walter [name] is a 77 y.o. male Date: 09/12/2022 Primary Care Physican: Farris Has, MD Primary Cardiologist: Shirlee Latch Electrophysiologist: Townsend Roger Pacing: 97%         08/07/2022 Office Weight: 234 lbs   AT/AF Burden: <1%                           Attempted call to patient and unable to reach.  Left detailed message per DPR regarding transmission. Transmission reviewed.    CorVue thoracic impedance suggesting normal fluid levels with the exception of possible fluid accumulation from 6/6-6/14.   Furosemide 20 mg Take 1 tablet (20 mg total) by mouth daily as needed.    Labs: 08/07/2022 Creatinine 1.22, BUN 18, Potassium 4.0, Sodium 135, GFR >60  A complete set of results can be found in Results Review.   Recommendations:  Left voice mail with ICM number and encouraged to call if experiencing any fluid symptoms.   Follow-up plan: ICM clinic phone appointment on 10/15/2022.  91 day device clinic remote transmission 12/07/2022.     EP/Cardiology Office Visits:  Recall 03/18/2023 with Francis Dowse, PA.  12/07/2022 with HF clinic.   Copy of ICM check sent to Dr. Lalla Brothers.  3 month ICM trend: 09/10/2022.    12-14 Month ICM trend:     [name], RN 09/12/2022 2:16 PM

## 2022-09-12 NOTE — Telephone Encounter (Signed)
Remote ICM transmission received.  Attempted call to patient regarding ICM remote transmission and left detailed message per DPR.  Left ICM phone number and advised to return call for any fluid symptoms or questions. Next ICM remote transmission scheduled 10/15/2022.    

## 2022-09-25 NOTE — Progress Notes (Signed)
Remote ICD transmission.   

## 2022-10-15 ENCOUNTER — Ambulatory Visit: Payer: Medicare Other | Attending: Cardiology

## 2022-10-15 DIAGNOSIS — Z95810 Presence of automatic (implantable) cardiac defibrillator: Secondary | ICD-10-CM

## 2022-10-15 DIAGNOSIS — I5022 Chronic systolic (congestive) heart failure: Secondary | ICD-10-CM | POA: Diagnosis not present

## 2022-10-17 NOTE — Progress Notes (Signed)
EPIC Encounter for ICM Monitoring  Patient Name: Walter [name] is a 77 y.o. male Date: 10/17/2022 Primary Care Physican: Farris Has, MD Primary Cardiologist: Shirlee Latch Electrophysiologist: Townsend Roger Pacing: 97%         08/07/2022 Office Weight: 234 lbs   AT/AF Burden: <1%                           Transmission reviewed.    CorVue thoracic impedance suggesting normal fluid levels with the exception of possible fluid accumulation from 7/12-7/20.   Furosemide 20 mg Take 1 tablet (20 mg total) by mouth daily as needed.    Labs: 08/07/2022 Creatinine 1.22, BUN 18, Potassium 4.0, Sodium 135, GFR >60  A complete set of results can be found in Results Review.   Recommendations:  No changes   Follow-up plan: ICM clinic phone appointment on 11/20/2022.  91 day device clinic remote transmission 12/07/2022.     EP/Cardiology Office Visits:  Recall 03/18/2023 with Francis Dowse, PA.  12/07/2022 with HF clinic.   Copy of ICM check sent to Dr. Lalla Brothers.  3 month ICM trend: 10/15/2022.    12-14 Month ICM trend:     [name], RN 10/17/2022 1:57 PM

## 2022-10-19 ENCOUNTER — Other Ambulatory Visit (HOSPITAL_COMMUNITY): Payer: Self-pay | Admitting: Cardiology

## 2022-10-19 DIAGNOSIS — I5022 Chronic systolic (congestive) heart failure: Secondary | ICD-10-CM

## 2022-10-28 ENCOUNTER — Other Ambulatory Visit (HOSPITAL_COMMUNITY): Payer: Self-pay | Admitting: Cardiology

## 2022-11-20 ENCOUNTER — Ambulatory Visit: Payer: Medicare Other | Attending: Cardiology

## 2022-11-20 DIAGNOSIS — Z95810 Presence of automatic (implantable) cardiac defibrillator: Secondary | ICD-10-CM | POA: Diagnosis not present

## 2022-11-20 DIAGNOSIS — I5022 Chronic systolic (congestive) heart failure: Secondary | ICD-10-CM

## 2022-11-23 ENCOUNTER — Telehealth: Payer: Self-pay

## 2022-11-23 NOTE — Progress Notes (Signed)
EPIC Encounter for ICM Monitoring  Patient Name: CZESLAW MANOUKIAN is a 77 y.o. male Date: 11/23/2022 Primary Care Physican: Farris Has, MD Primary Cardiologist: Shirlee Latch Electrophysiologist: Townsend Roger Pacing: 98%         08/07/2022 Office Weight: 234 lbs   AT/AF Burden: <1%                           Attempted call to patient and unable to reach.  Left detailed message per DPR regarding transmission. Transmission reviewed.    CorVue thoracic impedance suggesting normal fluid levels with the exception of possible fluid accumulation from 8/18-8/26 and possible dryness 8/31.   Furosemide 20 mg Take 1 tablet (20 mg total) by mouth daily as needed.    Labs: 08/07/2022 Creatinine 1.22, BUN 18, Potassium 4.0, Sodium 135, GFR >60  A complete set of results can be found in Results Review.   Recommendations:  Left voice mail with ICM number and encouraged to call if experiencing any fluid symptoms.   Follow-up plan: ICM clinic phone appointment on 12/24/2022.  91 day device clinic remote transmission 12/07/2022.     EP/Cardiology Office Visits:  Recall 03/18/2023 with Francis Dowse, PA.  12/07/2022 with HF clinic.   Copy of ICM check sent to Dr. Lalla Brothers.  3 month ICM trend: 11/20/2022.    12-14 Month ICM trend:     [name], RN 11/23/2022 1:05 PM

## 2022-11-23 NOTE — Telephone Encounter (Signed)
 Remote ICM transmission received.  Attempted call to patient regarding ICM remote transmission and left detailed message per DPR.  Left ICM phone number and advised to return call for any fluid symptoms or questions. Next ICM remote transmission scheduled 12/24/2022.

## 2022-12-07 ENCOUNTER — Encounter (HOSPITAL_COMMUNITY): Payer: Medicare Other

## 2022-12-07 ENCOUNTER — Ambulatory Visit (INDEPENDENT_AMBULATORY_CARE_PROVIDER_SITE_OTHER): Payer: Medicare Other

## 2022-12-07 DIAGNOSIS — I428 Other cardiomyopathies: Secondary | ICD-10-CM | POA: Diagnosis not present

## 2022-12-07 LAB — CUP PACEART REMOTE DEVICE CHECK
Battery Remaining Longevity: 76 mo
Battery Remaining Percentage: 84 %
Battery Voltage: 2.98 V
Brady Statistic AP VP Percent: 6.1 %
Brady Statistic AP VS Percent: 1 %
Brady Statistic AS VP Percent: 92 %
Brady Statistic AS VS Percent: 1.3 %
Brady Statistic RA Percent Paced: 6.6 %
Date Time Interrogation Session: 20240920020016
HighPow Impedance: 74 Ohm
Implantable Lead Connection Status: 753985
Implantable Lead Connection Status: 753985
Implantable Lead Connection Status: 753985
Implantable Lead Implant Date: 20151119
Implantable Lead Implant Date: 20151119
Implantable Lead Implant Date: 20151119
Implantable Lead Location: 753858
Implantable Lead Location: 753859
Implantable Lead Location: 753860
Implantable Pulse Generator Implant Date: 20230921
Lead Channel Impedance Value: 390 Ohm
Lead Channel Impedance Value: 460 Ohm
Lead Channel Impedance Value: 500 Ohm
Lead Channel Pacing Threshold Amplitude: 0.75 V
Lead Channel Pacing Threshold Amplitude: 1 V
Lead Channel Pacing Threshold Amplitude: 1 V
Lead Channel Pacing Threshold Pulse Width: 0.5 ms
Lead Channel Pacing Threshold Pulse Width: 0.5 ms
Lead Channel Pacing Threshold Pulse Width: 0.5 ms
Lead Channel Sensing Intrinsic Amplitude: 4 mV
Lead Channel Sensing Intrinsic Amplitude: 4.8 mV
Lead Channel Setting Pacing Amplitude: 1.5 V
Lead Channel Setting Pacing Amplitude: 2 V
Lead Channel Setting Pacing Amplitude: 2.5 V
Lead Channel Setting Pacing Pulse Width: 0.5 ms
Lead Channel Setting Pacing Pulse Width: 0.5 ms
Lead Channel Setting Sensing Sensitivity: 0.5 mV
Pulse Gen Serial Number: 211001533

## 2022-12-17 NOTE — Progress Notes (Signed)
Remote ICD transmission.   

## 2022-12-24 ENCOUNTER — Ambulatory Visit: Payer: Medicare Other

## 2022-12-24 DIAGNOSIS — I5022 Chronic systolic (congestive) heart failure: Secondary | ICD-10-CM | POA: Diagnosis not present

## 2022-12-24 DIAGNOSIS — Z95810 Presence of automatic (implantable) cardiac defibrillator: Secondary | ICD-10-CM

## 2022-12-27 ENCOUNTER — Encounter (HOSPITAL_COMMUNITY): Payer: Self-pay

## 2022-12-27 ENCOUNTER — Ambulatory Visit (HOSPITAL_COMMUNITY)
Admission: RE | Admit: 2022-12-27 | Discharge: 2022-12-27 | Disposition: A | Discharge status: Home / Self Care | Payer: Medicare Other | Source: Ambulatory Visit | Attending: Physician Assistant | Admitting: Physician Assistant

## 2022-12-27 VITALS — BP 106/68 | HR 74 | Wt 235.4 lb

## 2022-12-27 DIAGNOSIS — I480 Paroxysmal atrial fibrillation: Secondary | ICD-10-CM | POA: Diagnosis not present

## 2022-12-27 DIAGNOSIS — Z7984 Long term (current) use of oral hypoglycemic drugs: Secondary | ICD-10-CM | POA: Diagnosis not present

## 2022-12-27 DIAGNOSIS — E785 Hyperlipidemia, unspecified: Secondary | ICD-10-CM | POA: Insufficient documentation

## 2022-12-27 DIAGNOSIS — Z87891 Personal history of nicotine dependence: Secondary | ICD-10-CM | POA: Diagnosis not present

## 2022-12-27 DIAGNOSIS — Z7901 Long term (current) use of anticoagulants: Secondary | ICD-10-CM | POA: Diagnosis not present

## 2022-12-27 DIAGNOSIS — I110 Hypertensive heart disease with heart failure: Secondary | ICD-10-CM | POA: Insufficient documentation

## 2022-12-27 DIAGNOSIS — I10 Essential (primary) hypertension: Secondary | ICD-10-CM

## 2022-12-27 DIAGNOSIS — I447 Left bundle-branch block, unspecified: Secondary | ICD-10-CM | POA: Diagnosis not present

## 2022-12-27 DIAGNOSIS — I428 Other cardiomyopathies: Secondary | ICD-10-CM | POA: Diagnosis not present

## 2022-12-27 DIAGNOSIS — E119 Type 2 diabetes mellitus without complications: Secondary | ICD-10-CM | POA: Insufficient documentation

## 2022-12-27 DIAGNOSIS — Z79899 Other long term (current) drug therapy: Secondary | ICD-10-CM | POA: Diagnosis not present

## 2022-12-27 DIAGNOSIS — I5032 Chronic diastolic (congestive) heart failure: Secondary | ICD-10-CM | POA: Insufficient documentation

## 2022-12-27 DIAGNOSIS — I2510 Atherosclerotic heart disease of native coronary artery without angina pectoris: Secondary | ICD-10-CM | POA: Diagnosis not present

## 2022-12-27 LAB — COMPREHENSIVE METABOLIC PANEL
ALT: 19 U/L (ref 0–44)
AST: 20 U/L (ref 15–41)
Albumin: 4 g/dL (ref 3.5–5.0)
Alkaline Phosphatase: 59 U/L (ref 38–126)
Anion gap: 10 (ref 5–15)
BUN: 18 mg/dL (ref 8–23)
CO2: 27 mmol/L (ref 22–32)
Calcium: 9.4 mg/dL (ref 8.9–10.3)
Chloride: 98 mmol/L (ref 98–111)
Creatinine, Ser: 1.5 mg/dL — ABNORMAL HIGH (ref 0.61–1.24)
GFR, Estimated: 47 mL/min — ABNORMAL LOW (ref >60)
Glucose, Bld: 142 mg/dL — ABNORMAL HIGH (ref 70–99)
Potassium: 4.5 mmol/L (ref 3.5–5.1)
Sodium: 135 mmol/L (ref 135–145)
Total Bilirubin: 1.3 mg/dL — ABNORMAL HIGH (ref 0.3–1.2)
Total Protein: 6.8 g/dL (ref 6.5–8.1)

## 2022-12-27 LAB — BRAIN NATRIURETIC PEPTIDE: B Natriuretic Peptide: 25.5 pg/mL (ref 0.0–100.0)

## 2022-12-27 NOTE — Progress Notes (Addendum)
Date:  12/27/2022   ID:  Walter [name], DOB 03-13-1946, MRN [id number]   Provider location: [place] [facility] Type of Visit: Established patient   PCP:  Farris Has, MD  Cardiologist:  Dr. Shirlee Latch   History of Present Illness: Walter [name] is a 77 y.o. male who has a history of paroxysmal atrial fibrillation, chronic LBBB and cardiomyopathy.  He was found to have LBBB 15-20 years ago.  In 2011, he had a stress test in [facility] that was abnormal so he was taken for [facility] in 6/11 that showed nonobstructive disease.  He had an echo in 5/11 that showed EF 45-50%, diffuse hypokinesis suggesting a mild nonischemic cardiomyopathy.    He has admitted in 7/15 with atrial fibrillation with RVR.  Rate was very difficult to control.  TEE failed due to inability to pass probe x 2.  Eventually, ICE was done to rule out LAA thrombus and he was cardioverted to NSR.  Echo in 7/15 showed EF 20% with diffuse hypokinesis in setting of atrial fibrillation/RVR.  LHC was also done in 7/15 with minimal coronary disease.  Repeat echo in 10/15 showed EF 20-25%.  In 11/15, he had St Jude CRT-D device placed.  Repeat echo in 4/16 showed EF improved to 50% with mild LVH. Repeat echo in 11/17 showed EF 50-55%, mildly dilated RV with normal systolic function.  Echo in 12/19 again showed EF 50-55% with moderate LVH.   Left THR in 3/21.   Echo (5/21): EF 55%, mild LVH, normal RV, PASP 31 mmHg.  Echo in 1/22 showed EF 55%, normal RV.    Last seen in 05/24. Volume up on CorVue. Instructed to take lasix for 3 days. Cleda Daub restarted.  Echo 06/24: EF 55-60%, RV okay   He is here today for follow-up. Has been feeling well. Has been keeping active with yard work and repairing a shed. May notice a little bit of shortness of breath in warmer temperatures. Denies orthopnea or PND. Feels that he has been retaining a little fluid in his legs the last couple of days. He takes extra lasix as needed. Admits he  drinks a lot of fluid. Taking all medications as prescribed.  Reports occasional dizziness if he stands too quickly. No falls or presyncope.  St Jude device interrogation: 4 second run NSVT 09/24, no recent AT/AF, thoracic impedance trending below threshold the last few days    Labs (2/19): K 4, creatinine 1.5 Labs (11/19): LDL 58 Labs (5/20): K 4.3, creatinine 1.43 Labs (4/21): K 4.3, creatinine 1.3, TSH normal, LFTs normal Labs (8/23): K 4.2, creatinine 1.36 Labs (9/23): K 3.7, creatinien 1.39, LDL 77, hgb 19 Labs (05/24): K 4.0, creatinine 1.22, LDL 62, Hgb 18.5   PMH: 1. Type II diabetes 2. HTN 3. Adrenal nodule 4. Restless leg syndrome 5. OA with right hip pain, right frozen shoulder, low back pain. S/p R THR in 12/16.  S/p right shoulder replacement in 2/19. s/p left THR in 3/21.  6. Testicular cancer: 1972 7. Chronic LBBB: Had Cardiolite in 2011 that was abnormal so had LHC in 6/11 in [place], [place].  This showed 30% LCx stenosis and 40% ostial RCA stenosis.  LHC in 7/15 in [place] showed minimal coronary disease.  8. 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   - Echo (5/11) with EF 45-50%, moderate LVH.  - Echo (7/15) with EF 20%, diffuse hypokinesis, mild to moderately decreased RV systolic function (in setting  of afib with RVR).  - LHC (7/15) with minimal coronary disease.   - Echo (10/15) with EF 20-25%, mild LVH, moderate LAE.  - St Jude CRT-D device placed in 11/15.  - Echo (4/16) with EF 50%, mild LVH.  - Echo (11/17): EF 50-55%, mildly dilated RV with normal systolic function.  - Echo (12/19): EF 50-55%, moderate LVH, mildly dilated RV with normal systolic function.  - Echo (5/21): EF 55%, mild LVH, RV normal, PASP 31 mmHg.  - Echo (1/22): EF 55%, normal RV.  - Echo (06/24): EF 55-60%, RV okay 9. Factor V Leiden + 10. Asbestosis: Mild.  Exposure while in the [employer].  11. GERD 12. PVCs 13.  Polycythemia: Uncertain etiology 14. Hyperlipidemia: Myalgias with Zocor and Lipitor.  15. Atrial fibrillation: Paroxysmal.  Unable to pass TEE probe.  16. Tremor  Current Outpatient Medications  Medication Sig Dispense Refill   albuterol (PROVENTIL HFA;VENTOLIN HFA) 108 (90 Base) MCG/ACT inhaler Inhale 1-2 puffs into the lungs every 6 (six) hours as needed for wheezing or shortness of breath.     carvedilol (COREG) 12.5 MG tablet TAKE 1 TABLET TWICE A DAY 180 tablet 3   cholecalciferol (VITAMIN D) 25 MCG (1000 UNIT) tablet Take 1,000 Units by mouth daily.     Cyanocobalamin (VITAMIN B-12) 2500 MCG SUBL Place 2,500 mcg under the tongue in the morning.     ELIQUIS 5 MG TABS tablet TAKE 1 TABLET TWICE A DAY (NEED FOLLOW UP APPOINTMENT FOR ANY MORE REFILLS) 180 tablet 3   empagliflozin (JARDIANCE) 25 MG TABS tablet Take 25 mg by mouth daily.     esomeprazole (NEXIUM) 40 MG capsule Take 40 mg by mouth daily before breakfast.     furosemide (LASIX) 20 MG tablet TAKE 1 TABLET EVERY OTHER DAY 45 tablet 3   gabapentin (NEURONTIN) 100 MG capsule Take 100 mg by mouth daily.     gabapentin (NEURONTIN) 300 MG capsule Take 300 mg by mouth at bedtime.     JARDIANCE 25 MG TABS tablet Take 25 mg by mouth daily.     lisinopril (ZESTRIL) 10 MG tablet TAKE 1 TABLET EVERY EVENING 90 tablet 3   lovastatin (ALTOPREV) 40 MG 24 hr tablet Take 40 mg by mouth at bedtime.     Magnesium Oxide 400 MG CAPS Take 1 capsule by mouth daily.     melatonin 5 MG TABS Take 15-20 mg by mouth at bedtime as needed (Sleep).     metFORMIN (GLUCOPHAGE) 500 MG tablet Take 1,000 mg by mouth in the morning and at bedtime.     metFORMIN (GLUCOPHAGE-XR) 500 MG 24 hr tablet Take 1,000 mg by mouth in the morning and at bedtime.     morphine (MS CONTIN) 30 MG 12 hr tablet Take 30 mg by mouth every 12 (twelve) hours.     omega-3 acid ethyl esters (LOVAZA) 1 G capsule Take 1 g by mouth 2 (two) times daily.      ondansetron (ZOFRAN ODT) 4 MG  disintegrating tablet Take 1 tablet (4 mg total) by mouth every 8 (eight) hours as needed for nausea or vomiting. 6 tablet 0   rOPINIRole (REQUIP) 1 MG tablet Take 1 mg by mouth at bedtime.      sildenafil (VIAGRA) 100 MG tablet Take 100 mg by mouth daily as needed for erectile dysfunction.     spironolactone (ALDACTONE) 25 MG tablet TAKE 1 TABLET EVERY MORNING 90 tablet 3   testosterone cypionate (DEPOTESTOSTERONE CYPIONATE) 200 MG/ML injection  Inject 200 mg into the muscle See admin instructions. Inject (200 mg) intramuscularly every 10 days.     tiZANidine (ZANAFLEX) 4 MG tablet Take 4 mg by mouth daily as needed for muscle spasms.     No current facility-administered medications for this encounter.    Allergies:   Statins and Cortisone   Social History:  The patient  reports that he quit smoking about 49 years ago. His smoking use included cigarettes. He has never used smokeless tobacco. He reports current alcohol use. He reports that he does not use drugs.   Family History:  The patient's family history includes Heart attack in his maternal uncle; Heart disease in his father, mother, and paternal uncle; Hypertension in his brother, brother, and mother.   ROS:  Please see the history of present illness.   All other systems are personally reviewed and negative.   Exam:   BP 106/68   Pulse 74   Wt 106.8 kg (235 lb 6.4 oz)   SpO2 94%   BMI 31.93 kg/m  General:  Well appearing. HEENT: normal Neck: supple. no JVD. Cor: PMI nondisplaced. Regular rate & rhythm. No rubs, gallops or murmurs. Lungs: clear Abdomen: soft, nontender, nondistended.  Extremities: no cyanosis, clubbing, rash, 1-2 + edema, prominent varicose veins LLE Neuro: alert & oriented x 3. Affect pleasant   Recent Labs: 08/07/2022: B Natriuretic Peptide 61.0; BUN 18; Creatinine, Ser 1.22; Hemoglobin 18.5; Platelets 165; Potassium 4.0; Sodium 135  Personally reviewed   Wt Readings from Last 3 Encounters:  12/27/22  106.8 kg (235 lb 6.4 oz)  08/07/22 106.5 kg (234 lb 12.8 oz)  03/06/22 106.1 kg (234 lb)      ASSESSMENT AND PLAN:  1. HFrEF with recovered EF/chronic diastolic CHF: Nonischemic cardiomyopathy.  There was a pre-existing possible LBBB cardiomyopathy, but EF was down to 20% in the setting of atrial fibrillation with RVR in 7/15 (suggesting tachycardia-mediated cardiomyopathy).  However, EF did not improve in NSR (20-25% on repeat echo in 10/15).  Now with St Jude CRT-D device.  No significant coronary disease on 7/15 LHC. Since placement of CRT-D, EF has improved to 55-60% on last echo in 06/24.   - NYHA class II. Volume mildly elevated on exam and by CorVue. Uses lasix PRN. He has a good understanding of when he needs diuretic. Plans to take it this afternoon. - Continue Coreg 12.5 BID - Continue Jardiance 25 daily (diabetes dose) - Continue empagliflozin 10 mg daily.  - Continue spironolactone 25 mg daily.  - CMET/BNP today 2. CAD: Nonobstructive, mild. No chest pain.  Taking lovastatin.  No aspirin as he is anticoagulated.  3. Hyperlipidemia: Tolerating lovastatin, had myalgias with other statins.    - LDL 62 mg/dL in 16/10 - No change in medications 4. HTN: BP stable - Continue above medications 5. Atrial fibrillation: Paroxysmal.  He had tremor with amiodarone and is now off.  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  No recent AF on device interrogation.  - Continue Eliquis.  - If he has recurrent atrial fibrillation, would favor ablation (versus dofetilide).    Followup: 6 months with APP.  Signed, Mallary Kreger, Dalbert Garnet, PA-C  12/27/2022

## 2022-12-27 NOTE — Patient Instructions (Signed)
Medication Changes:  No Changes In Medications at this time.   Lab Work:  Labs done today, your results will be available in MyChart, we will contact you for abnormal readings.  Follow-Up in: 6 MONTHS. PLEASE CALL OUR OFFICE AROUND FEBRUARY 2025 TO GET SCHEDULED FOR YOUR APPOINTMENT. PHONE NUMBER IS [phone] OPTION 2    At the [facility], you and your health needs are our priority. We have a designated team specialized in the treatment of Heart Failure. This Care Team includes your primary Heart Failure Specialized Cardiologist (physician), Advanced Practice Providers (APPs- Physician Assistants and Nurse Practitioners), and Pharmacist who all work together to provide you with the care you need, when you need it.   You may see any of the following providers on your designated Care Team at your next follow up:  Dr. Arvilla Meres Dr. Marca Ancona Dr. Dorthula Nettles Dr. Theresia Bough Tonye Becket, NP Robbie Lis, [place] [facility] D[place], [place] Brynda Peon, NP [place] Lee, NP Karle Plumber, PharmD   Please be sure to bring in all your medications bottles to every appointment.   Need to Contact us:  If you have any questions or concerns before your next appointment please send [place] a message through [place] or call our office at [phone].    TO LEAVE A MESSAGE FOR THE NURSE SELECT OPTION 2, PLEASE LEAVE A MESSAGE INCLUDING: YOUR NAME DATE OF BIRTH CALL BACK NUMBER REASON FOR CALL**this is important as we prioritize the call backs  YOU WILL RECEIVE A CALL BACK THE SAME DAY AS LONG AS YOU CALL BEFORE 4:00 PM

## 2022-12-28 NOTE — Progress Notes (Signed)
EPIC Encounter for ICM Monitoring  Patient Name: Walter [name] is a 77 y.o. male Date: 12/28/2022 Primary Care Physican: Farris Has, MD Primary Cardiologist: Shirlee Latch Electrophysiologist: Townsend Roger Pacing: 98%         08/07/2022 Office Weight: 234 lbs 12/27/2022 Office Weight: 235 lbs   AT/AF Burden: <1%                           Transmission reviewed.    CorVue thoracic impedance suggesting normal fluid levels with the exception of possible fluid accumulation from 9/11-9/21.   Furosemide 20 mg Take 1 tablet (20 mg total) by mouth daily as needed.    Labs: 08/07/2022 Creatinine 1.22, BUN 18, Potassium 4.0, Sodium 135, GFR >60  A complete set of results can be found in Results Review.   Recommendations:  Recommendations given at HF clinic OV on 10/10.   Follow-up plan: ICM clinic phone appointment on 01/28/2023.  91 day device clinic remote transmission 03/08/2023.     EP/Cardiology Office Visits:  Recall 03/18/2023 with Francis Dowse, PA.  Recall 06/25/2023 with HF clinic.   Copy of ICM check sent to Dr. Lalla Brothers.  3 month ICM trend: 12/24/2022.    12-14 Month ICM trend:     [name], RN 12/28/2022 3:48 PM

## 2023-01-28 ENCOUNTER — Ambulatory Visit: Payer: Medicare Other | Attending: Cardiology

## 2023-01-28 DIAGNOSIS — I5032 Chronic diastolic (congestive) heart failure: Secondary | ICD-10-CM

## 2023-01-28 DIAGNOSIS — Z95810 Presence of automatic (implantable) cardiac defibrillator: Secondary | ICD-10-CM | POA: Diagnosis not present

## 2023-02-01 ENCOUNTER — Telehealth: Payer: Self-pay

## 2023-02-01 NOTE — Telephone Encounter (Signed)
 Remote ICM transmission received.  Attempted call to patient regarding ICM remote transmission and left detailed message per DPR.  Left ICM phone number and advised to return call for any fluid symptoms or questions. Next ICM remote transmission scheduled 03/18/2023.

## 2023-02-01 NOTE — Progress Notes (Signed)
EPIC Encounter for ICM Monitoring  Patient Name: Walter [name] is a 77 y.o. male Date: 02/01/2023 Primary Care Physican: Farris Has, MD Primary Cardiologist: Shirlee Latch Electrophysiologist: Townsend Roger Pacing: 97%         08/07/2022 Office Weight: 234 lbs 12/27/2022 Office Weight: 235 lbs   AT/AF Burden: <1%                           Attempted call to patient and unable to reach.  Left detailed message per DPR regarding transmission. Transmission reviewed.    CorVue thoracic impedance suggesting normal fluid levels with the exception of possible fluid accumulation from 10/18-10/24 and 11/6-11/9.  Also suggesting possible dryness from 10/28-11/1 and 11/3-11/5.   Prescribed:  Furosemide 20 mg Take 1 tablet (20 mg total) by mouth every other day. Spironolactone 25 mg take 1 tablet by mouth daily    Labs: 12/27/2022 Creatinine 1.50, BUN 18, Potassium 4.5, Sodium 135, GFR 47 08/07/2022 Creatinine 1.22, BUN 18, Potassium 4.0, Sodium 135, GFR >60  A complete set of results can be found in Results Review.   Recommendations:  Left voice mail with ICM number and encouraged to call if experiencing any fluid symptoms.   Follow-up plan: ICM clinic phone appointment on 03/18/2023.  91 day device clinic remote transmission 03/08/2023.     EP/Cardiology Office Visits:  Recall 03/18/2023 with Francis Dowse, PA for yearly follow up.  Recall 06/25/2023 with HF clinic.   Copy of ICM check sent to Dr. Lalla Brothers.  3 month ICM trend: 01/28/2023.    12-14 Month ICM trend:     [name], RN 02/01/2023 9:01 AM

## 2023-03-08 ENCOUNTER — Ambulatory Visit (INDEPENDENT_AMBULATORY_CARE_PROVIDER_SITE_OTHER): Payer: Medicare Other

## 2023-03-08 DIAGNOSIS — I428 Other cardiomyopathies: Secondary | ICD-10-CM | POA: Diagnosis not present

## 2023-03-08 LAB — CUP PACEART REMOTE DEVICE CHECK
Battery Remaining Longevity: 73 mo
Battery Remaining Percentage: 81 %
Battery Voltage: 2.98 V
Brady Statistic AP VP Percent: 5.9 %
Brady Statistic AP VS Percent: 1 %
Brady Statistic AS VP Percent: 91 %
Brady Statistic AS VS Percent: 1.5 %
Brady Statistic RA Percent Paced: 6.5 %
Date Time Interrogation Session: 20241220020051
HighPow Impedance: 77 Ohm
Implantable Lead Connection Status: 753985
Implantable Lead Connection Status: 753985
Implantable Lead Connection Status: 753985
Implantable Lead Implant Date: 20151119
Implantable Lead Implant Date: 20151119
Implantable Lead Implant Date: 20151119
Implantable Lead Location: 753858
Implantable Lead Location: 753859
Implantable Lead Location: 753860
Implantable Pulse Generator Implant Date: 20230921
Lead Channel Impedance Value: 400 Ohm
Lead Channel Impedance Value: 480 Ohm
Lead Channel Impedance Value: 560 Ohm
Lead Channel Pacing Threshold Amplitude: 0.75 V
Lead Channel Pacing Threshold Amplitude: 1 V
Lead Channel Pacing Threshold Amplitude: 1 V
Lead Channel Pacing Threshold Pulse Width: 0.5 ms
Lead Channel Pacing Threshold Pulse Width: 0.5 ms
Lead Channel Pacing Threshold Pulse Width: 0.5 ms
Lead Channel Sensing Intrinsic Amplitude: 12 mV
Lead Channel Sensing Intrinsic Amplitude: 3 mV
Lead Channel Setting Pacing Amplitude: 1.5 V
Lead Channel Setting Pacing Amplitude: 2 V
Lead Channel Setting Pacing Amplitude: 2.5 V
Lead Channel Setting Pacing Pulse Width: 0.5 ms
Lead Channel Setting Pacing Pulse Width: 0.5 ms
Lead Channel Setting Sensing Sensitivity: 0.5 mV
Pulse Gen Serial Number: 211001533

## 2023-03-18 ENCOUNTER — Ambulatory Visit: Payer: Medicare Other | Attending: Cardiology

## 2023-03-18 DIAGNOSIS — I5032 Chronic diastolic (congestive) heart failure: Secondary | ICD-10-CM | POA: Diagnosis not present

## 2023-03-18 DIAGNOSIS — Z95810 Presence of automatic (implantable) cardiac defibrillator: Secondary | ICD-10-CM

## 2023-04-09 NOTE — Progress Notes (Signed)
Remote ICD transmission.   

## 2023-04-22 ENCOUNTER — Ambulatory Visit: Payer: Medicare Other | Attending: Cardiology

## 2023-04-22 DIAGNOSIS — Z95810 Presence of automatic (implantable) cardiac defibrillator: Secondary | ICD-10-CM | POA: Diagnosis not present

## 2023-04-22 DIAGNOSIS — I5032 Chronic diastolic (congestive) heart failure: Secondary | ICD-10-CM | POA: Diagnosis not present

## 2023-04-23 ENCOUNTER — Telehealth: Payer: Self-pay

## 2023-04-23 NOTE — Progress Notes (Signed)
EPIC Encounter for ICM Monitoring  Patient Name: Walter [name] is a 78 y.o. male Date: 04/23/2023 Primary Care Physican: Farris Has, MD Primary Cardiologist: Shirlee Latch Electrophysiologist: Townsend Roger Pacing: 97%         08/07/2022 Office Weight: 234 lbs 12/27/2022 Office Weight: 235 lbs 03/22/2023 Weight: 235 lbs   AT/AF Burden: <1%                           Attempted call to patient and unable to reach.  Left detailed message per DPR regarding transmission.  Transmission results reviewed.     CorVue thoracic impedance suggesting possible fluid accumulation starting 2/1.  Also suggesting possible fluid accumulation from 1/9-1/18    Prescribed:  Furosemide 20 mg Take 1 tablet (20 mg total) by mouth every other day. Spironolactone 25 mg take 1 tablet by mouth daily    Labs: 12/27/2022 Creatinine 1.50, BUN 18, Potassium 4.5, Sodium 135, GFR 47 08/07/2022 Creatinine 1.22, BUN 18, Potassium 4.0, Sodium 135, GFR >60  A complete set of results can be found in Results Review.   Recommendations:  Left voice mail with ICM number and encouraged to call if experiencing any fluid symptoms.    Follow-up plan: ICM clinic phone appointment on 04/29/2023 to recheck fluid levels.  91 day device clinic remote transmission 06/07/2023.     EP/Cardiology Office Visits:  Recall 03/18/2023 with Francis Dowse, PA for yearly follow up.  06/10/2023 with Dr Shirlee Latch.   Copy of ICM check sent to Dr. Lalla Brothers.  Will send to Dr Shirlee Latch for review if patient is reached.    3 month ICM trend: 04/22/2023.    12-14 Month ICM trend:     [name], RN 04/23/2023 7:43 AM

## 2023-04-23 NOTE — Telephone Encounter (Signed)
 Remote ICM transmission received.  Attempted call to patient regarding ICM remote transmission and left detailed message per DPR.  Left ICM phone number and advised to return call for any fluid symptoms or questions. Next ICM remote transmission scheduled 04/29/2023.

## 2023-04-25 ENCOUNTER — Other Ambulatory Visit: Payer: Self-pay | Admitting: Nurse Practitioner

## 2023-04-25 ENCOUNTER — Ambulatory Visit
Admission: RE | Admit: 2023-04-25 | Discharge: 2023-04-25 | Disposition: A | Discharge status: Home / Self Care | Payer: Medicare Other | Source: Ambulatory Visit | Attending: Nurse Practitioner | Admitting: Nurse Practitioner

## 2023-04-25 DIAGNOSIS — K5909 Other constipation: Secondary | ICD-10-CM

## 2023-04-29 ENCOUNTER — Ambulatory Visit: Payer: Medicare Other | Attending: Cardiology

## 2023-04-29 DIAGNOSIS — Z95810 Presence of automatic (implantable) cardiac defibrillator: Secondary | ICD-10-CM

## 2023-04-29 DIAGNOSIS — I5032 Chronic diastolic (congestive) heart failure: Secondary | ICD-10-CM

## 2023-05-01 NOTE — Progress Notes (Signed)
EPIC Encounter for ICM Monitoring  Patient Name: Walter [name] is a 78 y.o. male Date: 05/01/2023 Primary Care Physican: Farris Has, MD Primary Cardiologist: Shirlee Latch Electrophysiologist: Townsend Roger Pacing: 97%         08/07/2022 Office Weight: 234 lbs 12/27/2022 Office Weight: 235 lbs 03/22/2023 Weight: 235 lbs   AT/AF Burden: <1%                           Transmission results reviewed.     CorVue thoracic impedance suggesting fluid levels returned to normal.     Prescribed:  Furosemide 20 mg Take 1 tablet (20 mg total) by mouth every other day. Spironolactone 25 mg take 1 tablet by mouth daily    Labs: 12/27/2022 Creatinine 1.50, BUN 18, Potassium 4.5, Sodium 135, GFR 47 08/07/2022 Creatinine 1.22, BUN 18, Potassium 4.0, Sodium 135, GFR >60  A complete set of results can be found in Results Review.   Recommendations:  No changes.    Follow-up plan: ICM clinic phone appointment on 05/27/2023.  91 day device clinic remote transmission 06/07/2023.     EP/Cardiology Office Visits:  Recall 03/18/2023 with Francis Dowse, PA for yearly follow up.  06/10/2023 with Dr Shirlee Latch.   Copy of ICM check sent to Dr. Lalla Brothers.    3 month ICM trend: 04/29/2023.    12-14 Month ICM trend:     [name], RN 05/01/2023 1:47 PM

## 2023-05-27 ENCOUNTER — Ambulatory Visit: Payer: Medicare Other | Attending: Cardiology

## 2023-05-27 DIAGNOSIS — I5032 Chronic diastolic (congestive) heart failure: Secondary | ICD-10-CM | POA: Diagnosis not present

## 2023-05-27 DIAGNOSIS — Z95810 Presence of automatic (implantable) cardiac defibrillator: Secondary | ICD-10-CM | POA: Diagnosis not present

## 2023-05-27 NOTE — Progress Notes (Signed)
 EPIC Encounter for ICM Monitoring  Patient Name: Walter [name] is a 78 y.o. male Date: 05/27/2023 Primary Care Physican: Walter Has, MD Primary Cardiologist: Walter [name] Electrophysiologist: Walter [name] Pacing: 97%         08/07/2022 Office Weight: 234 lbs 12/27/2022 Office Weight: 235 lbs 03/22/2023 Weight: 235 lbs   AT/AF Burden: <1%    NSVT 2                        Spoke with patient and heart failure questions reviewed.  Transmission results reviewed.  Pt reports having flu in February and took 4 weeks to recover.     Diet: Had foods high in salt.  Uses salt shaker to cook.   CorVue thoracic impedance suggesting days of possible fluid accumulation starting 3/8 and also 2/11-2/21.     Prescribed:  Furosemide 20 mg Take 1 tablet (20 mg total) by mouth every other day.  3/10 /25 reports he [name] not been taking Lasix for several weeks. Spironolactone 25 mg take 1 tablet by mouth daily    Labs: 12/27/2022 Creatinine 1.50, BUN 18, Potassium 4.5, Sodium 135, GFR 47 08/07/2022 Creatinine 1.22, BUN 18, Potassium 4.0, Sodium 135, GFR >60  A complete set of results can be found in Results Review.   Recommendations:  Advised to take Lasix as prescribed and he agreed to take every other day.     Follow-up plan: ICM clinic phone appointment on 06/03/2023 to recheck fluid levels.  91 day device clinic remote transmission 06/07/2023.     EP/Cardiology Office Visits:  Recall 03/18/2023 with Walter Dowse, PA for yearly follow up.  06/10/2023 with Walter [name].   Copy of ICM check sent to Walter. Lalla [name].    3 month ICM trend: 05/27/2023.    12-14 Month ICM trend:     Walter Soda, RN 05/27/2023 3:21 PM

## 2023-06-03 ENCOUNTER — Ambulatory Visit: Attending: Cardiology

## 2023-06-03 DIAGNOSIS — Z95810 Presence of automatic (implantable) cardiac defibrillator: Secondary | ICD-10-CM

## 2023-06-03 DIAGNOSIS — I5032 Chronic diastolic (congestive) heart failure: Secondary | ICD-10-CM

## 2023-06-04 ENCOUNTER — Telehealth: Payer: Self-pay

## 2023-06-04 NOTE — Telephone Encounter (Signed)
 Remote ICM transmission received.  Attempted call to patient regarding ICM remote transmission and recording stated call cannot be completed at this time.  Try again later.

## 2023-06-04 NOTE — Progress Notes (Signed)
 EPIC Encounter for ICM Monitoring  Patient Name: Walter [name] is a 78 y.o. male Date: 06/04/2023 Primary Care Physican: Farris Has, MD Primary Cardiologist: Shirlee Latch Electrophysiologist: Townsend Roger Pacing: 97%         08/07/2022 Office Weight: 234 lbs 12/27/2022 Office Weight: 235 lbs 03/22/2023 Weight: 235 lbs   AT/AF Burden: <1%    NSVT 2                        Attempted call to patient and unable to reach.    Transmission results reviewed.     Diet: Had foods high in salt.  Uses salt shaker to cook.    CorVue thoracic impedance suggesting days of possible fluid accumulation starting 3/8 and returned close to baseline 3/17.     Prescribed:  Furosemide 20 mg Take 1 tablet (20 mg total) by mouth every other day.  05/27/23 reports he has not been taking Lasix for several weeks. Spironolactone 25 mg take 1 tablet by mouth daily    Labs: 12/27/2022 Creatinine 1.50, BUN 18, Potassium 4.5, Sodium 135, GFR 47 08/07/2022 Creatinine 1.22, BUN 18, Potassium 4.0, Sodium 135, GFR >60  A complete set of results can be found in Results Review.   Recommendations:  Unable to reach.     Follow-up plan: ICM clinic phone appointment on 07/01/2023.  91 day device clinic remote transmission 06/07/2023.     EP/Cardiology Office Visits:  06/18/2023 with Otilio Saber, PA for yearly follow up.  06/10/2023 with Dr Shirlee Latch.   Copy of ICM check sent to Dr. Lalla Brothers.     3 month ICM trend: 06/03/2023.    12-14 Month ICM trend:     [name], RN 06/04/2023 3:00 PM

## 2023-06-07 ENCOUNTER — Ambulatory Visit: Payer: Medicare Other

## 2023-06-07 DIAGNOSIS — I428 Other cardiomyopathies: Secondary | ICD-10-CM

## 2023-06-07 LAB — CUP PACEART REMOTE DEVICE CHECK
Battery Remaining Longevity: 71 mo
Battery Remaining Percentage: 78 %
Battery Voltage: 2.98 V
Brady Statistic AP VP Percent: 5.5 %
Brady Statistic AP VS Percent: 1 %
Brady Statistic AS VP Percent: 92 %
Brady Statistic AS VS Percent: 1.6 %
Brady Statistic RA Percent Paced: 6.1 %
Date Time Interrogation Session: 20250321020045
HighPow Impedance: 80 Ohm
Implantable Lead Connection Status: 753985
Implantable Lead Connection Status: 753985
Implantable Lead Connection Status: 753985
Implantable Lead Implant Date: 20151119
Implantable Lead Implant Date: 20151119
Implantable Lead Implant Date: 20151119
Implantable Lead Location: 753858
Implantable Lead Location: 753859
Implantable Lead Location: 753860
Implantable Pulse Generator Implant Date: 20230921
Lead Channel Impedance Value: 450 Ohm
Lead Channel Impedance Value: 490 Ohm
Lead Channel Impedance Value: 530 Ohm
Lead Channel Pacing Threshold Amplitude: 0.75 V
Lead Channel Pacing Threshold Amplitude: 1 V
Lead Channel Pacing Threshold Amplitude: 1 V
Lead Channel Pacing Threshold Pulse Width: 0.5 ms
Lead Channel Pacing Threshold Pulse Width: 0.5 ms
Lead Channel Pacing Threshold Pulse Width: 0.5 ms
Lead Channel Sensing Intrinsic Amplitude: 12 mV
Lead Channel Sensing Intrinsic Amplitude: 4 mV
Lead Channel Setting Pacing Amplitude: 1.5 V
Lead Channel Setting Pacing Amplitude: 2 V
Lead Channel Setting Pacing Amplitude: 2.5 V
Lead Channel Setting Pacing Pulse Width: 0.5 ms
Lead Channel Setting Pacing Pulse Width: 0.5 ms
Lead Channel Setting Sensing Sensitivity: 0.5 mV
Pulse Gen Serial Number: 211001533

## 2023-06-09 ENCOUNTER — Encounter: Payer: Self-pay | Admitting: Cardiology

## 2023-06-10 ENCOUNTER — Ambulatory Visit (HOSPITAL_COMMUNITY)
Admission: RE | Admit: 2023-06-10 | Discharge: 2023-06-10 | Disposition: A | Discharge status: Home / Self Care | Payer: Medicare Other | Source: Ambulatory Visit | Attending: Cardiology | Admitting: Cardiology

## 2023-06-10 ENCOUNTER — Encounter (HOSPITAL_COMMUNITY): Payer: Self-pay | Admitting: Cardiology

## 2023-06-10 VITALS — BP 90/58 | HR 79 | Wt 219.6 lb

## 2023-06-10 DIAGNOSIS — Z79899 Other long term (current) drug therapy: Secondary | ICD-10-CM | POA: Insufficient documentation

## 2023-06-10 DIAGNOSIS — Z7901 Long term (current) use of anticoagulants: Secondary | ICD-10-CM | POA: Diagnosis not present

## 2023-06-10 DIAGNOSIS — I480 Paroxysmal atrial fibrillation: Secondary | ICD-10-CM | POA: Diagnosis not present

## 2023-06-10 DIAGNOSIS — Z95810 Presence of automatic (implantable) cardiac defibrillator: Secondary | ICD-10-CM | POA: Diagnosis not present

## 2023-06-10 DIAGNOSIS — E785 Hyperlipidemia, unspecified: Secondary | ICD-10-CM | POA: Insufficient documentation

## 2023-06-10 DIAGNOSIS — I519 Heart disease, unspecified: Secondary | ICD-10-CM

## 2023-06-10 DIAGNOSIS — I428 Other cardiomyopathies: Secondary | ICD-10-CM | POA: Insufficient documentation

## 2023-06-10 DIAGNOSIS — I447 Left bundle-branch block, unspecified: Secondary | ICD-10-CM | POA: Insufficient documentation

## 2023-06-10 DIAGNOSIS — Z8249 Family history of ischemic heart disease and other diseases of the circulatory system: Secondary | ICD-10-CM | POA: Diagnosis not present

## 2023-06-10 DIAGNOSIS — I5042 Chronic combined systolic (congestive) and diastolic (congestive) heart failure: Secondary | ICD-10-CM | POA: Insufficient documentation

## 2023-06-10 DIAGNOSIS — I2510 Atherosclerotic heart disease of native coronary artery without angina pectoris: Secondary | ICD-10-CM | POA: Insufficient documentation

## 2023-06-10 DIAGNOSIS — I5032 Chronic diastolic (congestive) heart failure: Secondary | ICD-10-CM | POA: Diagnosis not present

## 2023-06-10 DIAGNOSIS — I110 Hypertensive heart disease with heart failure: Secondary | ICD-10-CM | POA: Insufficient documentation

## 2023-06-10 LAB — LIPID PANEL
Cholesterol: 108 mg/dL (ref 0–200)
HDL: 32 mg/dL — ABNORMAL LOW (ref >40)
LDL Cholesterol: 48 mg/dL (ref 0–99)
Total CHOL/HDL Ratio: 3.4 ratio
Triglycerides: 140 mg/dL (ref <150)
VLDL: 28 mg/dL (ref 0–40)

## 2023-06-10 LAB — BASIC METABOLIC PANEL
Anion gap: 11 (ref 5–15)
BUN: 22 mg/dL (ref 8–23)
CO2: 25 mmol/L (ref 22–32)
Calcium: 9.2 mg/dL (ref 8.9–10.3)
Chloride: 100 mmol/L (ref 98–111)
Creatinine, Ser: 1.23 mg/dL (ref 0.61–1.24)
GFR, Estimated: 60 mL/min — ABNORMAL LOW (ref >60)
Glucose, Bld: 90 mg/dL (ref 70–99)
Potassium: 4.5 mmol/L (ref 3.5–5.1)
Sodium: 136 mmol/L (ref 135–145)

## 2023-06-10 LAB — BRAIN NATRIURETIC PEPTIDE: B Natriuretic Peptide: 11.6 pg/mL (ref 0.0–100.0)

## 2023-06-10 NOTE — Patient Instructions (Signed)
 There has been no changes to your medications.  Labs done today, your results will be available in MyChart, we will contact you for abnormal readings.  Your physician recommends that you schedule a follow-up appointment in: 6 months ( September) ** PLEASE CALL THE OFFICE JUNE TO ARRANGE YOUR FOLLOW UP APPOINTMENT.**  If you have any questions or concerns before your next appointment please send [place] a message through [place] or call our office at [phone].    TO LEAVE A MESSAGE FOR THE NURSE SELECT OPTION 2, PLEASE LEAVE A MESSAGE INCLUDING: YOUR NAME DATE OF BIRTH CALL BACK NUMBER REASON FOR CALL**this is important as we prioritize the call backs  YOU WILL RECEIVE A CALL BACK THE SAME DAY AS LONG AS YOU CALL BEFORE 4:00 PM  At the [facility], you and your health needs are our priority. As part of our continuing mission to provide you with exceptional heart care, we have created designated Provider Care Teams. These Care Teams include your primary Cardiologist (physician) and Advanced Practice Providers (APPs- Physician Assistants and Nurse Practitioners) who all work together to provide you with the care you need, when you need it.   You may see any of the following providers on your designated Care Team at your next follow up: Dr Arvilla Meres Dr Marca Ancona Dr. Dorthula Nettles Dr. Clearnce Hasten Amy Filbert Schilder, NP Robbie Lis, [place] [facility] [place], [place] Brynda Peon, NP [place] Lee, NP Clarisa Kindred, NP Karle Plumber, PharmD Enos Fling, PharmD   Please be sure to bring in all your medications bottles to every appointment.    Thank you for choosing [facility]

## 2023-06-10 NOTE — Progress Notes (Signed)
 Date:  06/10/2023   ID:  EARLAND REISH, DOB 1945/05/11, MRN [id number]   Provider location: [facility] Type of Visit: Established patient   PCP:  Farris Has, MD  Cardiologist:  Dr. Shirlee Latch  Chief complaint: CHF   History of Present Illness: Walter [name] is a 78 y.o. male who has a history of paroxysmal atrial fibrillation, chronic LBBB and cardiomyopathy.  He was found to have LBBB 15-20 years ago.  In 2011, he had a stress test in [facility] that was abnormal so he was taken for [facility] in 6/11 that showed nonobstructive disease.  He had an echo in 5/11 that showed EF 45-50%, diffuse hypokinesis suggesting a mild nonischemic cardiomyopathy.    He has admitted in 7/15 with atrial fibrillation with RVR.  Rate was very difficult to control.  TEE failed due to inability to pass probe x 2.  Eventually, ICE was done to rule out LAA thrombus and he was cardioverted to NSR.  Echo in 7/15 showed EF 20% with diffuse hypokinesis in setting of atrial fibrillation/RVR.  LHC was also done in 7/15 with minimal coronary disease.  Repeat echo in 10/15 showed EF 20-25%.  In 11/15, he had St Jude CRT-D device placed.  Repeat echo in 4/16 showed EF improved to 50% with mild LVH. Repeat echo in 11/17 showed EF 50-55%, mildly dilated RV with normal systolic function.  Echo in 12/19 again showed EF 50-55% with moderate LVH.   Left THR in 3/21.   Echo (5/21): EF 55%, mild LVH, normal RV, PASP 31 mmHg.  Echo in 1/22 showed EF 55%, normal RV.    Last seen in 05/24. Volume up on CorVue. Instructed to take lasix for 3 days. Cleda Daub restarted.  Echo in 6/24 showed EF 55-60%, RV okay   Patient returns for followup of CHF.  Weight down 16 lbs on Mounjaro.  No lightheadedness unless he stands up too fast.  No palpitations.  Uses Lasix occasionally.  Generally, no exertional dyspnea, and he has had no chest pain. No orthopnea/PND.  No lightheadedness.   St Jude device interrogation: NSVT x 2,  no sustained VT, 97% BIV pacing, thoracic impedance up and down but currently high.  No AF.   ECG (personally reviewed): NSR, BiV pacing  Labs (2/19): K 4, creatinine 1.5 Labs (11/19): LDL 58 Labs (5/20): K 4.3, creatinine 1.43 Labs (4/21): K 4.3, creatinine 1.3, TSH normal, LFTs normal Labs (8/23): K 4.2, creatinine 1.36 Labs (9/23): K 3.7, creatinien 1.39, LDL 77, hgb 19 Labs (05/24): K 4.0, creatinine 1.22, LDL 62, Hgb 18.5 Labs (10/24): K 4.5, creatinine 1.5, BNP 25.5   PMH: 1. Type II diabetes 2. HTN 3. Adrenal nodule 4. Restless leg syndrome 5. OA with right hip pain, right frozen shoulder, low back pain. S/p R THR in 12/16.  S/p right shoulder replacement in 2/19. s/p left THR in 3/21.  6. Testicular cancer: 1972 7. Chronic LBBB: Had Cardiolite in 2011 that was abnormal so had LHC in 6/11 in [place], [place].  This showed 30% LCx stenosis and 40% ostial RCA stenosis.  LHC in 7/15 in [place] showed minimal coronary disease.  8. 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   - Echo (5/11) with EF 45-50%, moderate LVH.  - Echo (7/15) with EF 20%, diffuse hypokinesis, mild to moderately decreased RV systolic function (in setting of afib with RVR).  - LHC (7/15) with minimal coronary disease.   -  Echo (10/15) with EF 20-25%, mild LVH, moderate LAE.  - St Jude CRT-D device placed in 11/15.  - Echo (4/16) with EF 50%, mild LVH.  - Echo (11/17): EF 50-55%, mildly dilated RV with normal systolic function.  - Echo (12/19): EF 50-55%, moderate LVH, mildly dilated RV with normal systolic function.  - Echo (5/21): EF 55%, mild LVH, RV normal, PASP 31 mmHg.  - Echo (1/22): EF 55%, normal RV.  - Echo (06/24): EF 55-60%, RV okay 9. Factor V Leiden + 10. Asbestosis: Mild.  Exposure while in the [employer].  11. GERD 12. PVCs 13. Polycythemia: Uncertain etiology 14. Hyperlipidemia: Myalgias with Zocor and Lipitor.  15. Atrial  fibrillation: Paroxysmal.  Unable to pass TEE probe.  16. Tremor  Current Outpatient Medications  Medication Sig Dispense Refill   albuterol (PROVENTIL HFA;VENTOLIN HFA) 108 (90 Base) MCG/ACT inhaler Inhale 1-2 puffs into the lungs every 6 (six) hours as needed for wheezing or shortness of breath.     carvedilol (COREG) 12.5 MG tablet TAKE 1 TABLET TWICE A DAY 180 tablet 3   cholecalciferol (VITAMIN D) 25 MCG (1000 UNIT) tablet Take 1,000 Units by mouth daily.     Cyanocobalamin (VITAMIN B-12) 2500 MCG SUBL Place 2,500 mcg under the tongue in the morning.     ELIQUIS 5 MG TABS tablet TAKE 1 TABLET TWICE A DAY (NEED FOLLOW UP APPOINTMENT FOR ANY MORE REFILLS) 180 tablet 3   empagliflozin (JARDIANCE) 25 MG TABS tablet Take 25 mg by mouth daily.     furosemide (LASIX) 20 MG tablet TAKE 1 TABLET EVERY OTHER DAY 45 tablet 3   gabapentin (NEURONTIN) 100 MG capsule Take 100 mg by mouth daily.     gabapentin (NEURONTIN) 300 MG capsule Take 300 mg by mouth at bedtime.     JARDIANCE 25 MG TABS tablet Take 25 mg by mouth daily.     lisinopril (ZESTRIL) 10 MG tablet TAKE 1 TABLET EVERY EVENING 90 tablet 3   lovastatin (ALTOPREV) 40 MG 24 hr tablet Take 40 mg by mouth at bedtime.     Magnesium Oxide 400 MG CAPS Take 1 capsule by mouth daily.     melatonin 5 MG TABS Take 15-20 mg by mouth at bedtime as needed (Sleep).     metFORMIN (GLUCOPHAGE-XR) 500 MG 24 hr tablet Take 1,000 mg by mouth in the morning and at bedtime.     morphine (MS CONTIN) 30 MG 12 hr tablet Take 30 mg by mouth every 12 (twelve) hours.     omega-3 acid ethyl esters (LOVAZA) 1 G capsule Take 1 g by mouth 2 (two) times daily.      Omeprazole Magnesium (PRILOSEC PO) Take 1 tablet by mouth daily.     ondansetron (ZOFRAN ODT) 4 MG disintegrating tablet Take 1 tablet (4 mg total) by mouth every 8 (eight) hours as needed for nausea or vomiting. 6 tablet 0   rOPINIRole (REQUIP) 1 MG tablet Take 1 mg by mouth at bedtime.      sildenafil  (VIAGRA) 100 MG tablet Take 100 mg by mouth daily as needed for erectile dysfunction.     spironolactone (ALDACTONE) 25 MG tablet TAKE 1 TABLET EVERY MORNING 90 tablet 3   testosterone cypionate (DEPOTESTOSTERONE CYPIONATE) 200 MG/ML injection Inject 200 mg into the muscle See admin instructions. Inject (200 mg) intramuscularly every 10 days.     tirzepatide [facility]) 5 MG/0.5ML Pen Inject 5 mg into the skin once a week.  tiZANidine (ZANAFLEX) 4 MG tablet Take 4 mg by mouth daily as needed for muscle spasms.     No current facility-administered medications for this encounter.    Allergies:   Statins and Cortisone   Social History:  The patient  reports that he quit smoking about 50 years ago. His smoking use included cigarettes. He has never used smokeless tobacco. He reports current alcohol use. He reports that he does not use drugs.   Family History:  The patient's family history includes Heart attack in his maternal uncle; Heart disease in his father, mother, and paternal uncle; Hypertension in his brother, brother, and mother.   ROS:  Please see the history of present illness.   All other systems are personally reviewed and negative.   Exam:   BP (!) 90/58   Pulse 79   Wt 99.6 kg (219 lb 9.6 oz)   SpO2 92%   BMI 29.78 kg/m  General: NAD Neck: No JVD, no thyromegaly or thyroid nodule.  Lungs: Clear to auscultation bilaterally with normal respiratory effort. CV: Nondisplaced PMI.  Heart regular S1/S2, no S3/S4, no murmur.  1+ ankle edema.  No carotid bruit.  Normal pedal pulses.  Abdomen: Soft, nontender, no hepatosplenomegaly, no distention.  Skin: Intact without lesions or rashes.  Neurologic: Alert and oriented x 3.  Psych: Normal affect. Extremities: No clubbing or cyanosis.  HEENT: Normal.   Recent Labs: 08/07/2022: Hemoglobin 18.5; Platelets 165 12/27/2022: ALT 19 06/10/2023: B Natriuretic Peptide 11.6; BUN 22; Creatinine, Ser 1.23; Potassium 4.5; Sodium 136   Personally reviewed   Wt Readings from Last 3 Encounters:  06/10/23 99.6 kg (219 lb 9.6 oz)  12/27/22 106.8 kg (235 lb 6.4 oz)  08/07/22 106.5 kg (234 lb 12.8 oz)      ASSESSMENT AND PLAN:  1. HFrEF with recovered EF/chronic diastolic CHF: Nonischemic cardiomyopathy.  There was a pre-existing possible LBBB cardiomyopathy, but EF was down to 20% in the setting of atrial fibrillation with RVR in 7/15 (suggesting tachycardia-mediated cardiomyopathy).  However, EF did not improve in NSR (20-25% on repeat echo in 10/15).  Now with St Jude CRT-D device.  No significant coronary disease on 7/15 LHC. Since placement of CRT-D, EF has improved to 55-60% on last echo in 6/24.  NYHA class II, not volume overloaded by exam or Corvue.  - Continue prn Lasix use.  - Continue Coreg 12.5 BID - Continue Jardiance 25 daily (diabetes dose) - Continue spironolactone 25 mg daily. BMET/BNP today.  2. CAD: Nonobstructive, mild. No chest pain.  Taking lovastatin.  No aspirin as he is anticoagulated.  3. Hyperlipidemia: Tolerating lovastatin, had myalgias with other statins.    - Check lipids today.  4. HTN: BP not elevated.  5. Atrial fibrillation: Paroxysmal.  He had tremor with amiodarone and is now off.  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  No recent AF on device interrogation.  - Continue Eliquis.  - If he has recurrent atrial fibrillation, would favor ablation (versus dofetilide).    Followup: 6 months with APP.  I spent 31 minutes reviewing data, interviewing patient and his family, and organizing the orders/followup.  Signed, Marca Ancona, MD  06/10/2023

## 2023-06-17 NOTE — Progress Notes (Unsigned)
  Electrophysiology Office Note:   ID:  Kaedon, Fanelli [birth year], MRN [id number]  Primary Cardiologist: Marca Ancona, MD Electrophysiologist: Lanier Prude, MD  {Click to update primary MD,subspecialty MD or APP then REFRESH:1}    History of Present Illness:   Walter [name] is a 78 y.o. 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  seen today for routine electrophysiology followup.   Since last being seen in our clinic the patient reports doing ***.  he denies chest pain, palpitations, dyspnea, PND, orthopnea, nausea, vomiting, dizziness, syncope, edema, weight gain, or early satiety.   Review of systems complete and found to be negative unless listed in HPI.   EP Information / Studies Reviewed:    EKG is not ordered today. EKG from 06/10/23 reviewed which showed ***       ICD Interrogation-  reviewed in detail today,  See PACEART report.  Arrhythmia/Device History Abbott CRT-D 2015, gen change 11/2021   Physical Exam:   VS:  There were no vitals taken for this visit.   Wt Readings from Last 3 Encounters:  06/10/23 219 lb 9.6 oz (99.6 kg)  12/27/22 235 lb 6.4 oz (106.8 kg)  08/07/22 234 lb 12.8 oz (106.5 kg)     GEN: No acute distress *** NECK: No JVD; No carotid bruits CARDIAC: {EPRHYTHM:28826}, no murmurs, rubs, gallops RESPIRATORY:  Clear to auscultation without rales, wheezing or rhonchi  ABDOMEN: Soft, non-tender, non-distended EXTREMITIES:  {EDEMA LEVEL:28147::"No"} edema; No deformity   ASSESSMENT AND PLAN:    Chronic systolic CHF  s/p Abbott CRT-D  NICM euvolemic today Stable on an appropriate medical regimen Normal ICD function See Pace Art report No changes today  Paroxysmal AF *** burden Continue Eliquis for CHA2DS2VASc of at least 5 Off amiodarone 2/2  tremor  HTN Stable on current regimen   HLD Statin intolerant.    Disposition:   Follow up with Dr. Lalla Brothers in 12 months   Signed, Graciella Freer, PA-C

## 2023-06-18 ENCOUNTER — Encounter: Payer: Self-pay | Admitting: Student

## 2023-06-18 ENCOUNTER — Ambulatory Visit: Attending: Student | Admitting: Student

## 2023-06-18 VITALS — BP 112/68 | HR 93 | Ht 72.0 in | Wt 222.2 lb

## 2023-06-18 DIAGNOSIS — I10 Essential (primary) hypertension: Secondary | ICD-10-CM

## 2023-06-18 DIAGNOSIS — Z95810 Presence of automatic (implantable) cardiac defibrillator: Secondary | ICD-10-CM | POA: Diagnosis not present

## 2023-06-18 DIAGNOSIS — I428 Other cardiomyopathies: Secondary | ICD-10-CM | POA: Diagnosis present

## 2023-06-18 DIAGNOSIS — I480 Paroxysmal atrial fibrillation: Secondary | ICD-10-CM | POA: Diagnosis not present

## 2023-06-18 DIAGNOSIS — I519 Heart disease, unspecified: Secondary | ICD-10-CM

## 2023-06-18 LAB — CUP PACEART INCLINIC DEVICE CHECK
Battery Remaining Longevity: 69 mo
Brady Statistic RA Percent Paced: 5.9 %
Brady Statistic RV Percent Paced: 97 %
Date Time Interrogation Session: 20250401085324
HighPow Impedance: 70.875
Implantable Lead Connection Status: 753985
Implantable Lead Connection Status: 753985
Implantable Lead Connection Status: 753985
Implantable Lead Implant Date: 20151119
Implantable Lead Implant Date: 20151119
Implantable Lead Implant Date: 20151119
Implantable Lead Location: 753858
Implantable Lead Location: 753859
Implantable Lead Location: 753860
Implantable Pulse Generator Implant Date: 20230921
Lead Channel Impedance Value: 400 Ohm
Lead Channel Impedance Value: 462.5 Ohm
Lead Channel Impedance Value: 512.5 Ohm
Lead Channel Pacing Threshold Amplitude: 0.5 V
Lead Channel Pacing Threshold Amplitude: 0.5 V
Lead Channel Pacing Threshold Amplitude: 1 V
Lead Channel Pacing Threshold Amplitude: 1 V
Lead Channel Pacing Threshold Amplitude: 1 V
Lead Channel Pacing Threshold Amplitude: 1 V
Lead Channel Pacing Threshold Pulse Width: 0.5 ms
Lead Channel Pacing Threshold Pulse Width: 0.5 ms
Lead Channel Pacing Threshold Pulse Width: 0.5 ms
Lead Channel Pacing Threshold Pulse Width: 0.5 ms
Lead Channel Pacing Threshold Pulse Width: 0.5 ms
Lead Channel Pacing Threshold Pulse Width: 0.5 ms
Lead Channel Sensing Intrinsic Amplitude: 1.5 mV
Lead Channel Sensing Intrinsic Amplitude: 12 mV
Lead Channel Setting Pacing Amplitude: 1.5 V
Lead Channel Setting Pacing Amplitude: 2 V
Lead Channel Setting Pacing Amplitude: 2.5 V
Lead Channel Setting Pacing Pulse Width: 0.5 ms
Lead Channel Setting Pacing Pulse Width: 0.5 ms
Lead Channel Setting Sensing Sensitivity: 0.5 mV
Pulse Gen Serial Number: 211001533

## 2023-06-18 NOTE — Patient Instructions (Signed)
 Medication Instructions:  Your physician recommends that you continue on your current medications as directed. Please refer to the Current Medication list given to you today.  *If you need a refill on your cardiac medications before your next appointment, please call your pharmacy*  Lab Work: None ordered If you have labs (blood work) drawn today and your tests are completely normal, you will receive your results only by: MyChart Message (if you have MyChart) OR A paper copy in the mail If you have any lab test that is abnormal or we need to change your treatment, we will call you to review the results.  Follow-Up: At [facility], you and your health needs are our priority.  As part of our continuing mission to provide you with exceptional heart care, our providers are all part of one team.  This team includes your primary Cardiologist (physician) and Advanced Practice Providers or APPs (Physician Assistants and Nurse Practitioners) who all work together to provide you with the care you need, when you need it.  Your next appointment:   1 year(s)  Provider:   You may see Lanier Prude, MD or one of the following Advanced Practice Providers on your designated Care Team:   Francis Dowse, [place] Casimiro Needle "Mardelle Matte" Tillery, PA-C Canary Brim, NP     1st Floor: - Lobby - Registration  - Pharmacy  - Lab - Cafe  2nd Floor: - PV Lab - Diagnostic Testing (echo, CT, nuclear med)  3rd Floor: - Vacant  4th Floor: - TCTS (cardiothoracic surgery) - AFib Clinic - Structural Heart Clinic - Vascular Surgery  - Vascular Ultrasound  5th Floor: - [facility] (general and EP) - Clinical Pharmacy for coumadin, hypertension, lipid, weight-loss medications, and med management appointments    Valet parking services will be available as well.

## 2023-06-22 ENCOUNTER — Encounter: Payer: Self-pay | Admitting: Cardiology

## 2023-07-01 ENCOUNTER — Ambulatory Visit: Attending: Cardiology

## 2023-07-01 DIAGNOSIS — I519 Heart disease, unspecified: Secondary | ICD-10-CM | POA: Diagnosis not present

## 2023-07-01 DIAGNOSIS — Z95810 Presence of automatic (implantable) cardiac defibrillator: Secondary | ICD-10-CM

## 2023-07-02 ENCOUNTER — Telehealth: Payer: Self-pay

## 2023-07-02 NOTE — Telephone Encounter (Signed)
 Remote ICM transmission received.  Attempted call to patient regarding ICM remote transmission and left detailed message per DPR.  Left ICM phone number and advised to return call for any fluid symptoms or questions. Next ICM remote transmission scheduled 07/08/2023.

## 2023-07-02 NOTE — Progress Notes (Signed)
 EPIC Encounter for ICM Monitoring  Patient Name: Walter [name] is a 78 y.o. male Date: 07/02/2023 Primary Care Physican: Ronna Coho, MD Primary Cardiologist: Mitzie Anda Electrophysiologist: Kasandra Pain Pacing: 98%         08/07/2022 Office Weight: 234 lbs 12/27/2022 Office Weight: 235 lbs 03/22/2023 Weight: 235 lbs   AT/AF Burden: 0%              Attempted call to patient and unable to reach.  Left detailed message per DPR regarding transmission.  Transmission results reviewed.    Diet: Had foods high in salt.  Uses salt shaker to cook.    CorVue thoracic impedance suggesting days of possible fluid accumulation starting 3/27.   Prescribed:  Furosemide 20 mg Take 1 tablet (20 mg total) by mouth every other day.   Spironolactone 25 mg take 1 tablet by mouth daily    Labs: 12/27/2022 Creatinine 1.50, BUN 18, Potassium 4.5, Sodium 135, GFR 47 08/07/2022 Creatinine 1.22, BUN 18, Potassium 4.0, Sodium 135, GFR >60  A complete set of results can be found in Results Review.   Recommendations:  Left voice mail with ICM number and encouraged to call if experiencing any fluid symptoms.  Will advise to take PRN Furosemide if patient is reached.   Follow-up plan: ICM clinic phone appointment on 07/08/2023 to recheck fluid levels.  91 day device clinic remote transmission 09/06/2023.     EP/Cardiology Office Visits:  Recall 06/12/2024 with Dr. Marven Slimmer.  Recall 12/07/2023 with Dr Mitzie Anda.   Copy of ICM check sent to Dr. Marven Slimmer.    3 month ICM trend: 07/01/2023.    12-14 Month ICM trend:     Almyra Jain, RN 07/02/2023 10:53 AM

## 2023-07-08 ENCOUNTER — Ambulatory Visit: Attending: Cardiology

## 2023-07-08 DIAGNOSIS — I519 Heart disease, unspecified: Secondary | ICD-10-CM

## 2023-07-08 DIAGNOSIS — Z95810 Presence of automatic (implantable) cardiac defibrillator: Secondary | ICD-10-CM

## 2023-07-09 NOTE — Progress Notes (Signed)
 EPIC Encounter for ICM Monitoring  Patient Name: Walter [name] is a 78 y.o. male Date: 07/09/2023 Primary Care Physican: Ronna Coho, MD Primary Cardiologist: Mitzie Anda Electrophysiologist: Kasandra Pain Pacing: 98%         08/07/2022 Office Weight: 234 lbs 12/27/2022 Office Weight: 235 lbs 03/22/2023 Weight: 235 lbs   AT/AF Burden: 0%             Transmission results reviewed.    Diet: Had foods high in salt.  Uses salt shaker to cook.    CorVue thoracic impedance suggesting fluid levels returned to normal.   Prescribed:  Furosemide  20 mg Take 1 tablet (20 mg total) by mouth every other day.   Spironolactone  25 mg take 1 tablet by mouth daily    Labs: 12/27/2022 Creatinine 1.50, BUN 18, Potassium 4.5, Sodium 135, GFR 47 08/07/2022 Creatinine 1.22, BUN 18, Potassium 4.0, Sodium 135, GFR >60  A complete set of results can be found in Results Review.   Recommendations:  No changes   Follow-up plan: ICM clinic phone appointment on 08/05/2023.  91 day device clinic remote transmission 09/06/2023.     EP/Cardiology Office Visits:  Recall 06/12/2024 with Dr. Marven Slimmer.  Recall 12/07/2023 with Dr Mitzie Anda.   Copy of ICM check sent to Dr. Marven Slimmer.    3 month ICM trend: 07/08/2023.    12-14 Month ICM trend:     Almyra Jain, RN 07/09/2023 1:06 PM

## 2023-07-11 ENCOUNTER — Other Ambulatory Visit (HOSPITAL_COMMUNITY): Payer: Self-pay | Admitting: Cardiology

## 2023-07-16 NOTE — Progress Notes (Signed)
 Remote ICD transmission.

## 2023-08-05 ENCOUNTER — Ambulatory Visit: Attending: Cardiology

## 2023-08-05 DIAGNOSIS — Z95810 Presence of automatic (implantable) cardiac defibrillator: Secondary | ICD-10-CM

## 2023-08-05 DIAGNOSIS — I519 Heart disease, unspecified: Secondary | ICD-10-CM | POA: Diagnosis not present

## 2023-08-06 NOTE — Progress Notes (Signed)
 EPIC Encounter for ICM Monitoring  Patient Name: Walter [name] is a 78 y.o. male Date: 08/06/2023 Primary Care Physican: Ronna Coho, MD Primary Cardiologist: Mitzie Anda Electrophysiologist: Kasandra Pain Pacing: 98%         08/07/2022 Office Weight: 234 lbs 12/27/2022 Office Weight: 235 lbs 03/22/2023 Weight: 235 lbs 08/06/2023 Weight:  215 lbs   AT/AF Burden: <1%             Spoke with patient and heart failure questions reviewed.  Transmission results reviewed.  Pt does get some swelling in feet but usually resolves quickly.  He has not needed PRN Lasix  recently.  Discussed importance of balancing fluid levels better.    Diet:  Eating out once a week.  Is not strict with salt intake and eating foods high in salt such as cheese and crackers.      CorVue thoracic impedance suggesting possible fluid accumulation starting 5/4-5/16 and returned to normal 5/17.   Prescribed:  Furosemide  20 mg Take 1 tablet (20 mg total) by mouth every other day.  08/06/2023 Pt takes as needed instead of every other day and Dr Mitzie Anda is aware.   Spironolactone  25 mg take 1 tablet by mouth daily    Labs: 06/10/2023 Creatinine 1.23, BUN 22, Potassium 4.5, Sodium 136, GFR 60 A complete set of results can be found in Results Review.   Recommendations:  Discussed limiting salt intake and taking Lasix  once a week to help balance fluid levels.  Encouraged to call if experiencing any fluid symptoms.    Follow-up plan: ICM clinic phone appointment on 08/26/2023 to recheck fluid levels after taking Lasix  once a week.  91 day device clinic remote transmission 09/06/2023.     EP/Cardiology Office Visits:  Recall 06/12/2024 with Dr. Marven Slimmer.  Recall 12/07/2023 with Dr Mitzie Anda.   Copy of ICM check sent to Dr. Marven Slimmer.    3 month ICM trend: 08/05/2023.    12-14 Month ICM trend:     Almyra Jain, RN 08/06/2023 5:01 PM

## 2023-08-08 ENCOUNTER — Other Ambulatory Visit (HOSPITAL_COMMUNITY): Payer: Self-pay | Admitting: Cardiology

## 2023-08-26 ENCOUNTER — Ambulatory Visit: Attending: Cardiology

## 2023-08-26 DIAGNOSIS — I519 Heart disease, unspecified: Secondary | ICD-10-CM

## 2023-08-26 DIAGNOSIS — Z95810 Presence of automatic (implantable) cardiac defibrillator: Secondary | ICD-10-CM

## 2023-08-28 ENCOUNTER — Telehealth: Payer: Self-pay

## 2023-08-28 NOTE — Telephone Encounter (Signed)
 Remote ICM transmission received.  Attempted call to patient regarding ICM remote transmission and line was busy

## 2023-08-28 NOTE — Progress Notes (Signed)
 EPIC Encounter for ICM Monitoring  Patient Name: Walter [name] is a 78 y.o. male Date: 08/28/2023 Primary Care Physican: Ronna Coho, MD Primary Cardiologist: Mitzie Anda Electrophysiologist: Kasandra Pain Pacing: 98%         08/07/2022 Office Weight: 234 lbs 12/27/2022 Office Weight: 235 lbs 03/22/2023 Weight: 235 lbs 08/06/2023 Weight:  215 lbs   AT/AF Burden: <1%             Attempted call to patient and unable to reach.   Transmission results reviewed.    Diet:  Eating out once a week.  Is not strict with salt intake and eating foods high in salt such as cheese and crackers.      CorVue thoracic impedance suggesting possible fluid accumulation from 5/28-6/6.  Suggesting possibly still retaining fluid after taking Lasix  1 x week to help decrease long periods of fluid retention.   Prescribed:  Furosemide  20 mg Take 1 tablet (20 mg total) by mouth every other day.  08/06/2023 Pt takes as needed instead of every other day and Dr Mitzie Anda is aware.   Spironolactone  25 mg take 1 tablet by mouth daily    Labs: 06/10/2023 Creatinine 1.23, BUN 22, Potassium 4.5, Sodium 136, GFR 60 A complete set of results can be found in Results Review.   Recommendations:  Unable to reach.     Follow-up plan: ICM clinic phone appointment on 09/30/2023.  91 day device clinic remote transmission 09/06/2023.     EP/Cardiology Office Visits:  Recall 06/12/2024 with Dr. Marven Slimmer.  Recall 12/07/2023 with Dr Mitzie Anda.   Copy of ICM check sent to Dr. Marven Slimmer.  3 month ICM trend: 08/26/2023.    12-14 Month ICM trend:     Almyra Jain, RN 08/28/2023 8:20 AM

## 2023-09-06 ENCOUNTER — Ambulatory Visit (INDEPENDENT_AMBULATORY_CARE_PROVIDER_SITE_OTHER): Payer: Medicare Other

## 2023-09-06 DIAGNOSIS — I428 Other cardiomyopathies: Secondary | ICD-10-CM | POA: Diagnosis not present

## 2023-09-06 LAB — CUP PACEART REMOTE DEVICE CHECK
Battery Remaining Longevity: 67 mo
Battery Remaining Percentage: 75 %
Battery Voltage: 2.98 V
Brady Statistic AP VP Percent: 1 %
Brady Statistic AP VS Percent: 1 %
Brady Statistic AS VP Percent: 98 %
Brady Statistic AS VS Percent: 1.3 %
Brady Statistic RA Percent Paced: 1 %
Date Time Interrogation Session: 20250620020051
HighPow Impedance: 73 Ohm
Implantable Lead Connection Status: 753985
Implantable Lead Connection Status: 753985
Implantable Lead Connection Status: 753985
Implantable Lead Implant Date: 20151119
Implantable Lead Implant Date: 20151119
Implantable Lead Implant Date: 20151119
Implantable Lead Location: 753858
Implantable Lead Location: 753859
Implantable Lead Location: 753860
Implantable Pulse Generator Implant Date: 20230921
Lead Channel Impedance Value: 400 Ohm
Lead Channel Impedance Value: 460 Ohm
Lead Channel Impedance Value: 510 Ohm
Lead Channel Pacing Threshold Amplitude: 0.5 V
Lead Channel Pacing Threshold Amplitude: 1 V
Lead Channel Pacing Threshold Amplitude: 1 V
Lead Channel Pacing Threshold Pulse Width: 0.5 ms
Lead Channel Pacing Threshold Pulse Width: 0.5 ms
Lead Channel Pacing Threshold Pulse Width: 0.5 ms
Lead Channel Sensing Intrinsic Amplitude: 12 mV
Lead Channel Sensing Intrinsic Amplitude: 2.2 mV
Lead Channel Setting Pacing Amplitude: 1.5 V
Lead Channel Setting Pacing Amplitude: 2 V
Lead Channel Setting Pacing Amplitude: 2.5 V
Lead Channel Setting Pacing Pulse Width: 0.5 ms
Lead Channel Setting Pacing Pulse Width: 0.5 ms
Lead Channel Setting Sensing Sensitivity: 0.5 mV
Pulse Gen Serial Number: 211001533

## 2023-09-09 ENCOUNTER — Ambulatory Visit: Payer: Self-pay | Admitting: Cardiology

## 2023-09-30 ENCOUNTER — Ambulatory Visit: Attending: Cardiology

## 2023-09-30 DIAGNOSIS — I519 Heart disease, unspecified: Secondary | ICD-10-CM

## 2023-09-30 DIAGNOSIS — Z95810 Presence of automatic (implantable) cardiac defibrillator: Secondary | ICD-10-CM | POA: Diagnosis not present

## 2023-10-04 NOTE — Progress Notes (Signed)
 EPIC Encounter for ICM Monitoring  Patient Name: Walter [name] is a 78 y.o. male Date: 10/04/2023 Primary Care Physican: Kip Righter, MD Primary Cardiologist: Rolan Electrophysiologist: Cindie Pore Pacing: 98%         08/07/2022 Office Weight: 234 lbs 12/27/2022 Office Weight: 235 lbs 03/22/2023 Weight: 235 lbs 08/06/2023 Weight:  215 lbs   AT/AF Burden: <1%             Attempted call to patient and unable to reach.  Transmission results reviewed.    Diet:  Eating out once a week.  Is not strict with salt intake and eating foods high in salt such as cheese and crackers.      CorVue thoracic impedance suggesting normal fluid levels with the exception of possible fluid accumulation from 6/13-6/17.   Prescribed:  Furosemide  20 mg Take 1 tablet (20 mg total) by mouth every other day.  08/06/2023 Pt takes as needed instead of every other day and Dr Rolan is aware.   Spironolactone  25 mg take 1 tablet by mouth daily    Labs: 06/10/2023 Creatinine 1.23, BUN 22, Potassium 4.5, Sodium 136, GFR 60 A complete set of results can be found in Results Review.   Recommendations:  Unable to reach.      Follow-up plan: ICM clinic phone appointment on 11/04/2023.  91 day device clinic remote transmission 12/06/2023.     EP/Cardiology Office Visits:  Recall 06/12/2024 with Dr. Cindie.  11/27/2023 with Dr Rolan.   Copy of ICM check sent to Dr. Cindie.     3 month ICM trend: 09/30/2023.    12-14 Month ICM trend:     Mitzie GORMAN Garner, RN 10/04/2023 3:49 PM

## 2023-11-04 ENCOUNTER — Ambulatory Visit: Attending: Cardiology

## 2023-11-04 DIAGNOSIS — I519 Heart disease, unspecified: Secondary | ICD-10-CM | POA: Diagnosis not present

## 2023-11-04 DIAGNOSIS — Z95810 Presence of automatic (implantable) cardiac defibrillator: Secondary | ICD-10-CM

## 2023-11-04 NOTE — Progress Notes (Signed)
 Remote ICD transmission.

## 2023-11-06 ENCOUNTER — Telehealth: Payer: Self-pay

## 2023-11-06 NOTE — Telephone Encounter (Signed)
 Remote ICM transmission received.  Attempted call to patient regarding ICM remote transmission and left detailed message per DPR.  Left ICM phone number and advised to return call for any fluid symptoms or questions. Next ICM remote transmission scheduled 12/09/2023.

## 2023-11-06 NOTE — Progress Notes (Signed)
 EPIC Encounter for ICM Monitoring  Patient Name: Walter [name] is a 78 y.o. male Date: 11/06/2023 Primary Care Physican: Kip Righter, MD Primary Cardiologist: Rolan Electrophysiologist: Cindie Pore Pacing: 97%         08/07/2022 Office Weight: 234 lbs 12/27/2022 Office Weight: 235 lbs 03/22/2023 Weight: 235 lbs 08/06/2023 Weight:  215 lbs   AT/AF Burden: <1%             Attempted call to patient and unable to reach.  Left detailed message per DPR regarding transmission.  Transmission results reviewed.    Diet:  Eating out once a week.  Is not strict with salt intake and eating foods high in salt such as cheese and crackers.      CorVue thoracic impedance suggesting possible fluid accumulation from 7/29-8/15 followed by possible dryness starting 8/16 but starting to trend back to baseline.   Prescribed:  Furosemide  20 mg Take 1 tablet (20 mg total) by mouth every other day.  08/06/2023 Pt takes as needed instead of every other day and Dr Rolan is aware.   Spironolactone  25 mg take 1 tablet by mouth daily    Labs: 06/10/2023 Creatinine 1.23, BUN 22, Potassium 4.5, Sodium 136, GFR 60 A complete set of results can be found in Results Review.   Recommendations:  Left voice mail with ICM number and encouraged to call if experiencing any fluid symptoms.      Follow-up plan: ICM clinic phone appointment on 12/09/2023.  91 day device clinic remote transmission 12/06/2023.     EP/Cardiology Office Visits:  Recall 06/12/2024 with Dr. Cindie.  11/27/2023 with Dr Rolan.   Copy of ICM check sent to Dr. Cindie.    3 month ICM trend: 11/04/2023.    12-14 Month ICM trend:     Mitzie GORMAN Garner, RN 11/06/2023 12:57 PM

## 2023-11-08 ENCOUNTER — Ambulatory Visit: Admitting: Diagnostic Neuroimaging

## 2023-11-08 ENCOUNTER — Encounter: Payer: Self-pay | Admitting: Diagnostic Neuroimaging

## 2023-11-08 VITALS — BP 108/72 | HR 78 | Ht 72.0 in | Wt 213.2 lb

## 2023-11-08 DIAGNOSIS — G250 Essential tremor: Secondary | ICD-10-CM | POA: Diagnosis not present

## 2023-11-08 MED ORDER — PRIMIDONE 50 MG PO TABS: 25.0000 mg | ORAL_TABLET | Freq: Two times a day (BID) | ORAL | 5 refills | Status: DC

## 2023-11-08 NOTE — Progress Notes (Signed)
 [facility]  PATIENT: Walter [name] DOB: [birth year]  REFERRING CLINICIAN: Kip Righter, MD HISTORY FROM: patient  REASON FOR VISIT: new consult   HISTORICAL  CHIEF COMPLAINT:  Chief Complaint  Patient presents with   New Patient (Initial Visit)    RM 7, Pt w/wife, referred by PCP for worsening tremor. Pt seen at [facility] in 2017 for tremor. Pt states tremors in both hands and eating is a challenge b/c difficult to keep food on the fork - using a spoon half of the time. Pt states it worsens toward the end of the dayl.    HISTORY OF PRESENT ILLNESS:   78 year old male here for evaluation of tremor.  Symptoms started 2017.  Patient reports postural action tremor of left greater than right upper extremities.  Symptoms worse at the end of the day.  He notices difficulty with utensils and handwriting.  Was diagnosed with essential tremor in 2017 by Dr. Jenel.  Symptoms are gradual worsened over time.   REVIEW OF SYSTEMS: Full 14 system review of systems performed and negative with exception of: as per HPI.  ALLERGIES: Allergies  Allergen Reactions   Statins Other (See Comments)    Myalgias.  Can only take Altoprev     Cortisone Other (See Comments)    Received Cortisone injection in left hip on Friday before Thanksgiving ,2020 and the next day, does not remember 3 days as he zoned out. Physician informed him to let Anesthesia know this.    HOME MEDICATIONS: Outpatient Medications Prior to Visit  Medication Sig Dispense Refill   albuterol  (PROVENTIL  HFA;VENTOLIN  HFA) 108 (90 Base) MCG/ACT inhaler Inhale 1-2 puffs into the lungs every 6 (six) hours as needed for wheezing or shortness of breath.     apixaban  (ELIQUIS ) 5 MG TABS tablet TAKE 1 TABLET TWICE A DAY (NEED FOLLOW UP APPOINTMENT FOR ANY MORE REFILLS) 180 tablet 1   carvedilol  (COREG ) 12.5 MG tablet TAKE 1 TABLET TWICE A DAY 180 tablet 1   cholecalciferol  (VITAMIN D ) 25 MCG (1000 UNIT) tablet Take 1,000  Units by mouth daily.     Cyanocobalamin  (VITAMIN B-12) 2500 MCG SUBL Place 2,500 mcg under the tongue in the morning.     empagliflozin (JARDIANCE) 25 MG TABS tablet Take 25 mg by mouth daily.     FREESTYLE LITE test strip 1 each by Other route as needed for other.     furosemide  (LASIX ) 20 MG tablet TAKE 1 TABLET EVERY OTHER DAY 45 tablet 3   gabapentin  (NEURONTIN ) 100 MG capsule Take 100 mg by mouth daily.     gabapentin  (NEURONTIN ) 300 MG capsule Take 300 mg by mouth at bedtime.     JARDIANCE 25 MG TABS tablet Take 25 mg by mouth daily.     lisinopril  (ZESTRIL ) 10 MG tablet TAKE 1 TABLET EVERY EVENING (Patient taking differently: Take 5 mg by mouth every evening.) 90 tablet 1   Magnesium  Oxide 400 MG CAPS Take 1 capsule by mouth daily.     melatonin 5 MG TABS Take 15-20 mg by mouth at bedtime as needed (Sleep).     metFORMIN  (GLUCOPHAGE -XR) 500 MG 24 hr tablet Take 1,000 mg by mouth in the morning and at bedtime. (Patient taking differently: Take 500 mg by mouth in the morning and at bedtime.)     morphine  (MS CONTIN ) 30 MG 12 hr tablet Take 30 mg by mouth every 12 (twelve) hours.     omega-3 acid ethyl esters (LOVAZA ) 1 G capsule Take  1 g by mouth 2 (two) times daily.      Omeprazole Magnesium  (PRILOSEC PO) Take 1 tablet by mouth daily.     ondansetron  (ZOFRAN  ODT) 4 MG disintegrating tablet Take 1 tablet (4 mg total) by mouth every 8 (eight) hours as needed for nausea or vomiting. 6 tablet 0   rOPINIRole  (REQUIP ) 1 MG tablet Take 1 mg by mouth at bedtime.      sildenafil  (VIAGRA ) 100 MG tablet Take 100 mg by mouth daily as needed for erectile dysfunction.     spironolactone  (ALDACTONE ) 25 MG tablet TAKE 1 TABLET EVERY MORNING 90 tablet 3   testosterone  cypionate (DEPOTESTOSTERONE CYPIONATE) 200 MG/ML injection Inject 200 mg into the muscle See admin instructions. Inject (200 mg) intramuscularly every 10 days.     tirzepatide (MOUNJARO) 5 MG/0.5ML Pen Inject 5 mg into the skin once a week.      tiZANidine  (ZANAFLEX ) 4 MG tablet Take 4 mg by mouth daily as needed for muscle spasms.     lovastatin  (ALTOPREV ) 40 MG 24 hr tablet Take 40 mg by mouth at bedtime. (Patient not taking: Reported on 11/08/2023)     No facility-administered medications prior to visit.    PAST MEDICAL HISTORY: Past Medical History:  Diagnosis Date   AICD (automatic cardioverter/defibrillator) present    Anxiety    Asbestosis (HCC)    mild   Atrial fibrillation (HCC)    Chronic low back pain    /sciatica- Dr Recardo ([facility])   Complication of anesthesia 1996   previous surgery had a run of v-tach (for gallbladder-1990's)-cannot lie completely flat and panis when oxygen mask placed over face   Coronary artery disease, non-occlusive June 2011   Cardiac cath in [place]  following abnormal Myoview   Delirium 05/06/2019   DJD (degenerative joint disease)    /osteoarthritis   Dyslipidemia, goal LDL below 100    On lovastatin  (myalgias with Zocor and Lipitor)   Dyspnea    worse in hot weather   Dysrhythmia    LBBB, in 2015-atrial fibrillation   Essential hypertension    Factor V Leiden [facility])    sees Dr. Charolet Marrow- no problems with this   Frozen shoulder    right   GERD (gastroesophageal reflux disease)    H/O asbestosis    when in [employer]-1964-1970,1971-1986 in [employer]-diagnosed in 1990   History of kidney stones    Hypogonadism male    Irritable bowel    Kidney stone    Left bundle branch block (LBBB) on electrocardiogram    Diagnosed close to 20 years ago   Myocardial infarction [facility])    Non-ischemic cardiomyopathy (HCC) June 2011   EF 45-50%; suspect related to LBBB; repeat Echo March 2015: EF 50- 55%   Obesity    PONV (postoperative nausea and vomiting)    Restless leg syndrome    Rotator cuff arthropathy of right shoulder 05/14/2017   Sleep apnea    Testicular cancer (HCC) 1972   left   Tremor, essential 12/09/2015   Type 2 diabetes mellitus without  complication (HCC) 06/2011   Type 2.    Wears glasses     PAST SURGICAL HISTORY: Past Surgical History:  Procedure Laterality Date   APPENDECTOMY N/A    BI-VENTRICULAR IMPLANTABLE CARDIOVERTER DEFIBRILLATOR N/A 02/04/2014   SJM Quadra Assura BiV ICD implanted for primary prevention by Dr Kelsie   BIV ICD GENERATOR CHANGEOUT N/A 12/07/2021   Procedure: BIV ICD GENERATOR CHANGEOUT;  Surgeon: Cindie,  Ole DASEN, MD;  Location: [facility] INVASIVE CV LAB;  Service: Cardiovascular;  Laterality: N/A;   CARDIAC CATHETERIZATION  June 2011   Nonobstructive CAD   CARDIAC DEFIBRILLATOR PLACEMENT  02/04/14   [employer] [place] model [place] 6634-59V (serial  Number [id number]) biventricular ICD.   CARDIOVERSION N/A 10/13/2013   Procedure: CARDIOVERSION;  Surgeon: Ezra GORMAN Shuck, MD;  Location: [facility] OR;  Service: Cardiovascular;  Laterality: N/A;   CARDIOVERSION  10-16-2013   DCCV by Dr Kelsie following intracardiac echo to rule out LAA thrombus   CARDIOVERSION N/A 10/16/2013   Procedure: CARDIOVERSION;  Surgeon: Lynwood JONETTA Kelsie, MD;  Location: [facility] CATH LAB;  Service: Cardiovascular;  Laterality: N/A;   CHOLECYSTECTOMY OPEN     COLONOSCOPY WITH PROPOFOL  N/A 12/04/2019   Procedure: COLONOSCOPY WITH PROPOFOL ;  Surgeon: Lennard Lesta FALCON, MD;  Location: [facility] ENDOSCOPY;  Service: Endoscopy;  Laterality: N/A;   ELECTROPHYSIOLOGY STUDY N/A 10/16/2013   Procedure: ELECTROPHYSIOLOGY STUDY;  Surgeon: Lynwood JONETTA Kelsie, MD;  Location: [facility] CATH LAB;  Service: Cardiovascular;  Laterality: N/A;   ESOPHAGOGASTRODUODENOSCOPY N/A 10/15/2013   Procedure: ESOPHAGOGASTRODUODENOSCOPY (EGD);  Surgeon: Lupita FORBES Commander, MD;  Location: [facility] ENDOSCOPY;  Service: Endoscopy;  Laterality: N/A;  bedside   HERNIA REPAIR     KNEE ARTHROSCOPY Left    LEFT AND RIGHT HEART CATHETERIZATION WITH CORONARY ANGIOGRAM N/A 10/12/2013   Procedure: LEFT AND RIGHT HEART CATHETERIZATION WITH CORONARY ANGIOGRAM;  Surgeon: Debby DELENA Sor, MD;  Location: [facility] CATH LAB;   Service: Cardiovascular;  Laterality: N/A;   lymph nodes     RADIOFREQUENCY ABLATION NERVES     Back every 6 months   REVERSE SHOULDER ARTHROPLASTY Right 05/14/2017   Procedure: TOTAL REVERSE SHOULDER ARTHROPLASTY;  Surgeon: Josefina Chew, MD;  Location: [facility] OR;  Service: Orthopedics;  Laterality: Right;   ROTATOR CUFF REPAIR Right    SURGERY SCROTAL / TESTICULAR Left    TONSILLECTOMY Bilateral    TOTAL HIP ARTHROPLASTY Right 02/22/2015   Procedure: RIGHT TOTAL HIP ARTHROPLASTY ANTERIOR APPROACH;  Surgeon: Evalene JONETTA Chancy, MD;  Location: [facility] OR;  Service: Orthopedics;  Laterality: Right;   TOTAL HIP ARTHROPLASTY Left 05/26/2019   Procedure: TOTAL HIP ARTHROPLASTY;  Surgeon: Josefina Chew, MD;  Location: [facility] ORS;  Service: Orthopedics;  Laterality: Left;   TRANSTHORACIC ECHOCARDIOGRAM  May 2011   EF 45% with diffuse hypokinesis; septal bounce from LBBB   TRANSTHORACIC ECHOCARDIOGRAM  March 2015   EF 50-55%. Mild concentric LVH. Septal dyssynergy from LBBB     FAMILY HISTORY: Family History  Problem Relation Age of Onset   Hypertension Mother    Heart disease Mother    Heart disease Father    Hypertension Brother    Heart attack Maternal Uncle    Hypertension Brother    Heart disease Paternal Uncle     SOCIAL HISTORY: Social History   Socioeconomic History   Marital status: Married    Spouse name: Not on file   Number of children: 2   Years of education: 14   Highest education level: Not on file  Occupational History   Occupation: Retired  Tobacco Use   Smoking status: Former    Current packs/day: 0.00    Types: Cigarettes    Quit date: 02/10/1973    Years since quitting: 50.7   Smokeless tobacco: Never  Vaping Use   Vaping status: Never Used  Substance and Sexual Activity   Alcohol use: Yes    Comment: rare   Drug use: No  Sexual activity: Not on file    Comment: Married  Other Topics Concern   Not on file  Social History Narrative   Lives at home w/ his wife,  daughter and granddaughter   Left-handed   Caffeine: 1-2 diet sodas per day   Social Drivers of Health   Financial Resource Strain: Not on file  Food Insecurity: Not on file  Transportation Needs: Not on file  Physical Activity: Not on file  Stress: Not on file  Social Connections: Unknown (07/30/2021)   [facility] from [facility]   Social Network    Social Network: Not on file  Intimate Partner Violence: Unknown (06/21/2021)   Received from [facility]   HITS    Physically Hurt: Not on file    Insult or Talk Down To: Not on file    Threaten Physical Harm: Not on file    Scream or Curse: Not on file     PHYSICAL EXAM  GENERAL EXAM/CONSTITUTIONAL: Vitals:  Vitals:   11/08/23 1007  BP: 108/72  Pulse: 78  Weight: 213 lb 3.2 oz (96.7 kg)  Height: 6' (1.829 m)   Body mass index is 28.92 kg/m. Wt Readings from Last 3 Encounters:  11/08/23 213 lb 3.2 oz (96.7 kg)  06/18/23 222 lb 3.2 oz (100.8 kg)  06/10/23 219 lb 9.6 oz (99.6 kg)   Patient is in no distress; well developed, nourished and groomed; neck is supple  CARDIOVASCULAR: Examination of carotid arteries is normal; no carotid bruits Regular rate and rhythm, no murmurs Examination of peripheral vascular system by observation and palpation is normal  EYES: Ophthalmoscopic exam of optic discs and posterior segments is normal; no papilledema or hemorrhages No results found.  MUSCULOSKELETAL: Gait, strength, tone, movements noted in Neurologic exam below  NEUROLOGIC: MENTAL STATUS:      No data to display         awake, alert, oriented to person, place and time recent and remote memory intact normal attention and concentration language fluent, comprehension intact, naming intact fund of knowledge appropriate  CRANIAL NERVE:  2nd - no papilledema on fundoscopic exam 2nd, 3rd, 4th, 6th - pupils equal and reactive to light, visual fields full to confrontation, extraocular muscles intact, no  nystagmus 5th - facial sensation symmetric 7th - facial strength symmetric 8th - hearing intact 9th - palate elevates symmetrically, uvula midline 11th - shoulder shrug symmetric 12th - tongue protrusion midline  MOTOR:  MILD POSTURAL AND ACTION TREMOR IN BUE (LUE > RUE) NO RESTING TREMOR; NO COGWHEELING RIGIDITY; NO BRADYKINESIA normal bulk and tone, full strength in the BUE, BLE  SENSORY:  normal and symmetric to light touch, temperature, vibration; EXCEPT DECR IN FEET  COORDINATION:  fine finger movements normal; MILD ACTION TREMOR IN BUE  REFLEXES:  deep tendon reflexes 1+ and symmetric  GAIT/STATION:  narrow based gait; SLIGHTLY LIMPING DUE TO LEFT HIP TIGHTNESS (S/P HIP REPLACEMENT)     DIAGNOSTIC DATA (LABS, IMAGING, TESTING) - I reviewed patient records, labs, notes, testing and imaging myself where available.  Lab Results  Component Value Date   WBC 8.9 08/07/2022   HGB 18.5 (H) 08/07/2022   HCT 56.4 (H) 08/07/2022   MCV 94.2 08/07/2022   PLT 165 08/07/2022      Component Value Date/Time   NA 136 06/10/2023 1435   NA 139 11/10/2021 1019   K 4.5 06/10/2023 1435   CL 100 06/10/2023 1435   CO2 25 06/10/2023 1435   GLUCOSE 90 06/10/2023 1435  BUN 22 06/10/2023 1435   BUN 13 11/10/2021 1019   CREATININE 1.23 06/10/2023 1435   CREATININE 1.40 (H) 02/24/2016 0829   CALCIUM 9.2 06/10/2023 1435   PROT 6.8 12/27/2022 1043   PROT 6.6 07/03/2019 1127   ALBUMIN  4.0 12/27/2022 1043   ALBUMIN  4.4 07/03/2019 1127   AST 20 12/27/2022 1043   ALT 19 12/27/2022 1043   ALKPHOS 59 12/27/2022 1043   BILITOT 1.3 (H) 12/27/2022 1043   BILITOT 0.4 07/03/2019 1127   GFRNONAA 60 (L) 06/10/2023 1435   GFRAA >60 10/10/2019 0524   Lab Results  Component Value Date   CHOL 108 06/10/2023   HDL 32 (L) 06/10/2023   LDLCALC 48 06/10/2023   LDLDIRECT 81.7 12/21/2013   TRIG 140 06/10/2023   CHOLHDL 3.4 06/10/2023   Lab Results  Component Value Date   HGBA1C 6.9 (H)  10/09/2019   No results found for: [id number] Lab Results  Component Value Date   TSH 2.327 04/15/2020      ASSESSMENT AND PLAN  78 y.o. year old left handed male here with:   Dx:  1. Tremor, essential      PLAN:  ESSENTIAL TREMOR - primidone  25mg  every evening x 1 week; then 25mg  twice a day x 1 week; then 50mg  twice a day  - caution with sedation and driving  DIABETIC NEUROPATHY - may wean off gabapentin  if not having that much nerve pain  PERIODIC LIMB MOVEMENTS OF SLEEP - continue ropinirole  (started in 1990's)  Meds ordered this encounter  Medications   primidone  (MYSOLINE ) 50 MG tablet    Sig: Take 0.5-1 tablets (25-50 mg total) by mouth 2 (two) times daily.    Dispense:  60 tablet    Refill:  5   Return in about 6 months (around 05/10/2024) for MyChart visit (15 min).    EDUARD FABIENE HANLON, MD 11/08/2023, 10:58 AM Certified in Neurology, Neurophysiology and Neuroimaging  [facility] [address] [place], [place] [address] [phone]

## 2023-11-08 NOTE — Patient Instructions (Signed)
 ESSENTIAL TREMOR - primidone  25mg  every evening x 1 week; then 25mg  twice a day x 1 week; then 50mg  twice a day  - caution with sedation and driving  DIABETIC NEUROPATHY - may wean off gabapentin  if not having that much nerve pain  PERIODIC LIMB MOVEMENTS OF SLEEP - continue ropinirole  (started in 1990's)

## 2023-11-26 ENCOUNTER — Telehealth (HOSPITAL_COMMUNITY): Payer: Self-pay | Admitting: Cardiology

## 2023-11-26 NOTE — Telephone Encounter (Signed)
 Called to confirm/remind patient of their appointment at the [facility] on 11/26/23.   Appointment:   [x] Confirmed  [] Left mess   [] No answer/No voice mail  [] VM Full/unable to leave message  [] Phone not in service  Patient reminded to bring all medications and/or complete list.  Confirmed patient has transportation. Gave directions, instructed to utilize valet parking.

## 2023-11-27 ENCOUNTER — Ambulatory Visit (HOSPITAL_COMMUNITY)
Admission: RE | Admit: 2023-11-27 | Discharge: 2023-11-27 | Disposition: A | Discharge status: Home / Self Care | Source: Ambulatory Visit | Attending: Cardiology | Admitting: Cardiology

## 2023-11-27 ENCOUNTER — Ambulatory Visit (HOSPITAL_COMMUNITY): Payer: Self-pay | Admitting: Cardiology

## 2023-11-27 ENCOUNTER — Encounter (HOSPITAL_COMMUNITY): Payer: Self-pay | Admitting: Cardiology

## 2023-11-27 VITALS — BP 92/60 | HR 67 | Wt 217.6 lb

## 2023-11-27 DIAGNOSIS — I480 Paroxysmal atrial fibrillation: Secondary | ICD-10-CM | POA: Insufficient documentation

## 2023-11-27 DIAGNOSIS — Z7984 Long term (current) use of oral hypoglycemic drugs: Secondary | ICD-10-CM | POA: Diagnosis not present

## 2023-11-27 DIAGNOSIS — I519 Heart disease, unspecified: Secondary | ICD-10-CM

## 2023-11-27 DIAGNOSIS — Z7985 Long-term (current) use of injectable non-insulin antidiabetic drugs: Secondary | ICD-10-CM | POA: Insufficient documentation

## 2023-11-27 DIAGNOSIS — I428 Other cardiomyopathies: Secondary | ICD-10-CM | POA: Insufficient documentation

## 2023-11-27 DIAGNOSIS — E785 Hyperlipidemia, unspecified: Secondary | ICD-10-CM | POA: Insufficient documentation

## 2023-11-27 DIAGNOSIS — Z79899 Other long term (current) drug therapy: Secondary | ICD-10-CM | POA: Diagnosis not present

## 2023-11-27 DIAGNOSIS — I447 Left bundle-branch block, unspecified: Secondary | ICD-10-CM | POA: Insufficient documentation

## 2023-11-27 DIAGNOSIS — I2510 Atherosclerotic heart disease of native coronary artery without angina pectoris: Secondary | ICD-10-CM | POA: Diagnosis not present

## 2023-11-27 DIAGNOSIS — E8770 Fluid overload, unspecified: Secondary | ICD-10-CM | POA: Insufficient documentation

## 2023-11-27 DIAGNOSIS — Z7901 Long term (current) use of anticoagulants: Secondary | ICD-10-CM | POA: Insufficient documentation

## 2023-11-27 DIAGNOSIS — I5032 Chronic diastolic (congestive) heart failure: Secondary | ICD-10-CM | POA: Diagnosis not present

## 2023-11-27 DIAGNOSIS — I5022 Chronic systolic (congestive) heart failure: Secondary | ICD-10-CM

## 2023-11-27 DIAGNOSIS — I110 Hypertensive heart disease with heart failure: Secondary | ICD-10-CM | POA: Insufficient documentation

## 2023-11-27 LAB — BRAIN NATRIURETIC PEPTIDE: B Natriuretic Peptide: 22.3 pg/mL (ref 0.0–100.0)

## 2023-11-27 LAB — BASIC METABOLIC PANEL WITH GFR
Anion gap: 7 (ref 5–15)
BUN: 18 mg/dL (ref 8–23)
CO2: 29 mmol/L (ref 22–32)
Calcium: 8.5 mg/dL — ABNORMAL LOW (ref 8.9–10.3)
Chloride: 100 mmol/L (ref 98–111)
Creatinine, Ser: 1.1 mg/dL (ref 0.61–1.24)
GFR, Estimated: >60 mL/min (ref >60)
Glucose, Bld: 92 mg/dL (ref 70–99)
Potassium: 3.8 mmol/L (ref 3.5–5.1)
Sodium: 136 mmol/L (ref 135–145)

## 2023-11-27 MED ORDER — ROSUVASTATIN CALCIUM 10 MG PO TABS
10.0000 mg | ORAL_TABLET | Freq: Every day | ORAL | 3 refills | Status: AC
Start: 2023-11-27 — End: 2024-02-25

## 2023-11-27 MED ORDER — APIXABAN 5 MG PO TABS: 5.0000 mg | ORAL_TABLET | Freq: Two times a day (BID) | ORAL | 3 refills | Status: AC

## 2023-11-27 MED ORDER — SPIRONOLACTONE 25 MG PO TABS: 12.5000 mg | ORAL_TABLET | Freq: Every evening | ORAL | 3 refills | Status: AC

## 2023-11-27 MED ORDER — CARVEDILOL 12.5 MG PO TABS: 12.5000 mg | ORAL_TABLET | Freq: Two times a day (BID) | ORAL | 3 refills | Status: AC

## 2023-11-27 NOTE — Patient Instructions (Addendum)
 START Crestor  10 mg daily.  DECREASE Spironolactone  to 12.5 mg ( 1/2 Tab) nightly.  TAKE 20 MG OF LASIX  DAILY FOR THE NEXT 4 DAYS, THEN GO BACK TO EVERY OTHER DAY.  Labs done today, your results will be available in MyChart, we will contact you for abnormal readings.  REPEAT blood work in 10 days and again in 2 months   Your physician has requested that you have an echocardiogram. Echocardiography is a painless test that uses sound waves to create images of your heart. It provides your doctor with information about the size and shape of your heart and how well your heart's chambers and valves are working. This procedure takes approximately one hour. There are no restrictions for this procedure. Please do NOT wear cologne, perfume, aftershave, or lotions (deodorant is allowed). Please arrive 15 minutes prior to your appointment time.  Please note: We ask at that you not bring children with you during ultrasound (echo/ vascular) testing. Due to room size and safety concerns, children are not allowed in the ultrasound rooms during exams. Our front office staff cannot provide observation of children in our lobby area while testing is being conducted. An adult accompanying a patient to their appointment will only be allowed in the ultrasound room at the discretion of the ultrasound technician under special circumstances. We apologize for any inconvenience.  Your physician recommends that you schedule a follow-up appointment in: 6 months.   If you have any questions or concerns before your next appointment please send us  a message through [place] or call our office at [phone].    TO LEAVE A MESSAGE FOR THE NURSE SELECT OPTION 2, PLEASE LEAVE A MESSAGE INCLUDING: YOUR NAME DATE OF BIRTH CALL BACK NUMBER REASON FOR CALL**this is important as we prioritize the call backs  YOU WILL RECEIVE A CALL BACK THE SAME DAY AS LONG AS YOU CALL BEFORE 4:00 PM  At the [facility], you  and your health needs are our priority. As part of our continuing mission to provide you with exceptional heart care, we have created designated Provider Care Teams. These Care Teams include your primary Cardiologist (physician) and Advanced Practice Providers (APPs- Physician Assistants and Nurse Practitioners) who all work together to provide you with the care you need, when you need it.   You may see any of the following providers on your designated Care Team at your next follow up: Dr Toribio Fuel Dr Ezra Shuck Dr. Ria Commander Dr. Morene Brownie Amy Lenetta, NP Caffie Shed, [place] [facility] [place], [place] Beckey Coe, NP [place] Lee, NP Ellouise Class, NP Tinnie Redman, PharmD Jaun Bash, PharmD   Please be sure to bring in all your medications bottles to every appointment.    Thank you for choosing [facility]

## 2023-11-28 NOTE — Progress Notes (Signed)
 Date:  11/28/2023   ID:  Walter [name], DOB 1945/03/31, MRN [id number]   Provider location: [place] [facility] Type of Visit: Established patient   PCP:  Kip Righter, MD  Cardiologist:  Dr. Rolan  Chief complaint: CHF   History of Present Illness: Walter [name] is a 78 y.o. male who has a history of paroxysmal atrial fibrillation, chronic LBBB and cardiomyopathy.  He was found to have LBBB 15-20 years ago.  In 2011, he had a stress test in [facility] that was abnormal so he was taken for [facility] in 6/11 that showed nonobstructive disease.  He had an echo in 5/11 that showed EF 45-50%, diffuse hypokinesis suggesting a mild nonischemic cardiomyopathy.    He has admitted in 7/15 with atrial fibrillation with RVR.  Rate was very difficult to control.  TEE failed due to inability to pass probe x 2.  Eventually, ICE was done to rule out LAA thrombus and he was cardioverted to NSR.  Echo in 7/15 showed EF 20% with diffuse hypokinesis in setting of atrial fibrillation/RVR.  LHC was also done in 7/15 with minimal coronary disease.  Repeat echo in 10/15 showed EF 20-25%.  In 11/15, he had St Jude CRT-D device placed.  Repeat echo in 4/16 showed EF improved to 50% with mild LVH. Repeat echo in 11/17 showed EF 50-55%, mildly dilated RV with normal systolic function.  Echo in 12/19 again showed EF 50-55% with moderate LVH.   Left THR in 3/21.   Echo (5/21): EF 55%, mild LVH, normal RV, PASP 31 mmHg.  Echo in 1/22 showed EF 55%, normal RV.    Last seen in 05/24. Volume up on CorVue. Instructed to take lasix  for 3 days. Spiro restarted.  Echo in 6/24 showed EF 55-60%, RV okay   Patient returns for followup of CHF.  Weight down 2 lbs, still on Mounjaro.  He has been off spironolactone  (not sure why) and lovastatin  (no longer able to get this medication). He is taking Lasix  every 3-4 days.  No exertional dyspnea or chest pain.  No lightheadedness.  NSR today, has not felt palpitations.   Overall feeling ok.   St Jude device interrogation: No VT/AF, 96% BIV pacing, thoracic impedance trending down.    ECG (personally reviewed): NSR, BiV pacing  Labs (05/24): K 4.0, creatinine 1.22, LDL 62, Hgb 18.5 Labs (10/24): K 4.5, creatinine 1.5, BNP 25.5 Labs (3/25): LDL 48, BNP 11.6, K 4.5, creatinine 1.23   PMH: 1. Type II diabetes 2. HTN 3. Adrenal nodule 4. Restless leg syndrome 5. OA with right hip pain, right frozen shoulder, low back pain. S/p R THR in 12/16.  S/p right shoulder replacement in 2/19. s/p left THR in 3/21.  6. Testicular cancer: 1972 7. Chronic LBBB: Had Cardiolite  in 2011 that was abnormal so had LHC in 6/11 in [place], [place].  This showed 30% LCx stenosis and 40% ostial RCA stenosis.  LHC in 7/15 in [place] showed minimal coronary disease.  8. 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   - Echo (5/11) with EF 45-50%, moderate LVH.  - Echo (7/15) with EF 20%, diffuse hypokinesis, mild to moderately decreased RV systolic function (in setting of afib with RVR).  - LHC (7/15) with minimal coronary disease.   - Echo (10/15) with EF 20-25%, mild LVH, moderate LAE.  - St Jude CRT-D device placed in 11/15.  - Echo (4/16) with EF 50%, mild LVH.  -  Echo (11/17): EF 50-55%, mildly dilated RV with normal systolic function.  - Echo (12/19): EF 50-55%, moderate LVH, mildly dilated RV with normal systolic function.  - Echo (5/21): EF 55%, mild LVH, RV normal, PASP 31 mmHg.  - Echo (1/22): EF 55%, normal RV.  - Echo (06/24): EF 55-60%, RV okay 9. Factor V Leiden + 10. Asbestosis: Mild.  Exposure while in the [employer].  11. GERD 12. PVCs 13. Polycythemia: Uncertain etiology 14. Hyperlipidemia: Myalgias with Zocor and Lipitor.  15. Atrial fibrillation: Paroxysmal.  Unable to pass TEE probe.  16. Tremor  Current Outpatient Medications  Medication Sig Dispense Refill   albuterol  (PROVENTIL   HFA;VENTOLIN  HFA) 108 (90 Base) MCG/ACT inhaler Inhale 1-2 puffs into the lungs every 6 (six) hours as needed for wheezing or shortness of breath.     cholecalciferol  (VITAMIN D ) 25 MCG (1000 UNIT) tablet Take 1,000 Units by mouth daily.     Cyanocobalamin  (VITAMIN B-12) 2500 MCG SUBL Place 2,500 mcg under the tongue in the morning.     empagliflozin (JARDIANCE) 25 MG TABS tablet Take 25 mg by mouth daily.     FREESTYLE LITE test strip 1 each by Other route as needed for other.     furosemide  (LASIX ) 20 MG tablet TAKE 1 TABLET EVERY OTHER DAY 45 tablet 3   gabapentin  (NEURONTIN ) 100 MG capsule Take 100 mg by mouth daily.     gabapentin  (NEURONTIN ) 300 MG capsule Take 300 mg by mouth at bedtime.     JARDIANCE 25 MG TABS tablet Take 25 mg by mouth daily.     lisinopril  (ZESTRIL ) 5 MG tablet Take 5 mg by mouth daily.     Magnesium  Oxide 400 MG CAPS Take 1 capsule by mouth daily.     melatonin 5 MG TABS Take 15-20 mg by mouth at bedtime as needed (Sleep).     metFORMIN  (GLUCOPHAGE -XR) 500 MG 24 hr tablet Take 500 mg by mouth 2 (two) times daily.     morphine  (MS CONTIN ) 30 MG 12 hr tablet Take 30 mg by mouth every 12 (twelve) hours.     omega-3 acid ethyl esters (LOVAZA ) 1 G capsule Take 1 g by mouth 2 (two) times daily.      Omeprazole Magnesium  (PRILOSEC PO) Take 1 tablet by mouth daily.     ondansetron  (ZOFRAN  ODT) 4 MG disintegrating tablet Take 1 tablet (4 mg total) by mouth every 8 (eight) hours as needed for nausea or vomiting. 6 tablet 0   primidone  (MYSOLINE ) 50 MG tablet Take 0.5-1 tablets (25-50 mg total) by mouth 2 (two) times daily. 60 tablet 5   rOPINIRole  (REQUIP ) 1 MG tablet Take 1 mg by mouth at bedtime.      rosuvastatin  (CRESTOR ) 10 MG tablet Take 1 tablet (10 mg total) by mouth daily. 90 tablet 3   sildenafil  (VIAGRA ) 100 MG tablet Take 100 mg by mouth daily as needed for erectile dysfunction.     testosterone  cypionate (DEPOTESTOSTERONE CYPIONATE) 200 MG/ML injection Inject 200  mg into the muscle See admin instructions. Inject (200 mg) intramuscularly every 10 days.     tirzepatide (MOUNJARO) 5 MG/0.5ML Pen Inject 5 mg into the skin once a week.     tiZANidine  (ZANAFLEX ) 4 MG tablet Take 4 mg by mouth daily as needed for muscle spasms.     apixaban  (ELIQUIS ) 5 MG TABS tablet Take 1 tablet (5 mg total) by mouth 2 (two) times daily. 180 tablet 3   carvedilol  (COREG ) 12.5  MG tablet Take 1 tablet (12.5 mg total) by mouth 2 (two) times daily. 180 tablet 3   lovastatin  (ALTOPREV ) 40 MG 24 hr tablet Take 40 mg by mouth at bedtime. (Patient not taking: Reported on 11/27/2023)     spironolactone  (ALDACTONE ) 25 MG tablet Take 0.5 tablets (12.5 mg total) by mouth at bedtime. 90 tablet 3   No current facility-administered medications for this encounter.    Allergies:   Statins and Cortisone   Social History:  The patient  reports that he quit smoking about 50 years ago. His smoking use included cigarettes. He has never used smokeless tobacco. He reports current alcohol use. He reports that he does not use drugs.   Family History:  The patient's family history includes Heart attack in his maternal uncle; Heart disease in his father, mother, and paternal uncle; Hypertension in his brother, brother, and mother.   ROS:  Please see the history of present illness.   All other systems are personally reviewed and negative.   Exam:   BP 92/60   Pulse 67   Wt 98.7 kg (217 lb 9.6 oz)   SpO2 94%   BMI 29.51 kg/m  General: NAD Neck: JVP 8 cm with HJR, no thyromegaly or thyroid  nodule.  Lungs: Clear to auscultation bilaterally with normal respiratory effort. CV: Nondisplaced PMI.  Heart regular S1/S2, no S3/S4, no murmur.  1+ edema 1/2 to knees.  No carotid bruit.  Normal pedal pulses.  Abdomen: Soft, nontender, no hepatosplenomegaly, no distention.  Skin: Intact without lesions or rashes.  Neurologic: Alert and oriented x 3.  Psych: Normal affect. Extremities: No clubbing or  cyanosis.  HEENT: Normal.   Recent Labs: 12/27/2022: ALT 19 11/27/2023: B Natriuretic Peptide 22.3; BUN 18; Creatinine, Ser 1.10; Potassium 3.8; Sodium 136  Personally reviewed   Wt Readings from Last 3 Encounters:  11/27/23 98.7 kg (217 lb 9.6 oz)  11/08/23 96.7 kg (213 lb 3.2 oz)  06/18/23 100.8 kg (222 lb 3.2 oz)      ASSESSMENT AND PLAN:  1. HF with recovered EF/chronic diastolic CHF: Nonischemic cardiomyopathy.  There was a pre-existing possible LBBB cardiomyopathy, but EF was down to 20% in the setting of atrial fibrillation with RVR in 7/15 (suggesting tachycardia-mediated cardiomyopathy).  However, EF did not improve in NSR (20-25% on repeat echo in 10/15).  Now with St Jude CRT-D device.  No significant coronary disease on 7/15 LHC. Since placement of CRT-D, EF has improved to 55-60% on last echo in 6/24.  NYHA class II and stable but he does appear mildly volume overloaded today by exam and Corvue.   - I would like him to take Lasix  20 mg daily x 4 days then 20 mg every other day after that.  - Continue Coreg  12.5 BID - Continue Jardiance 25 daily (diabetes dose) - Restart spironolactone  12.5 mg daily with BMET/BNP today and BMET in 10 days.  - I will arrange for echo.  2. CAD: Nonobstructive, mild. No chest pain.   - No aspirin  as he is anticoagulated.  3. Hyperlipidemia: Can no longer get lovastatin .     - Start Crestor  10 mg daily with lipids/LFTs in 2 months.  4. HTN: BP now running on the low side.  5. Atrial fibrillation: Paroxysmal.  He had tremor with amiodarone  and is now off.  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  No recent AF on device interrogation.  - Continue Eliquis .  - If  he has recurrent atrial fibrillation, would favor ablation (versus dofetilide).    Followup: 6 months with APP.  I spent 32 minutes reviewing data, interviewing patient and his family, and organizing the  orders/followup.  Signed, Ezra Shuck, MD  11/28/2023

## 2023-12-03 ENCOUNTER — Ambulatory Visit (HOSPITAL_COMMUNITY)
Admission: RE | Admit: 2023-12-03 | Discharge: 2023-12-03 | Disposition: A | Discharge status: Home / Self Care | Source: Ambulatory Visit | Attending: Cardiology | Admitting: Cardiology

## 2023-12-03 DIAGNOSIS — I3481 Nonrheumatic mitral (valve) annulus calcification: Secondary | ICD-10-CM | POA: Insufficient documentation

## 2023-12-03 DIAGNOSIS — I429 Cardiomyopathy, unspecified: Secondary | ICD-10-CM | POA: Diagnosis not present

## 2023-12-03 DIAGNOSIS — I499 Cardiac arrhythmia, unspecified: Secondary | ICD-10-CM | POA: Diagnosis not present

## 2023-12-03 DIAGNOSIS — I110 Hypertensive heart disease with heart failure: Secondary | ICD-10-CM | POA: Insufficient documentation

## 2023-12-03 DIAGNOSIS — E119 Type 2 diabetes mellitus without complications: Secondary | ICD-10-CM | POA: Diagnosis not present

## 2023-12-03 DIAGNOSIS — I5032 Chronic diastolic (congestive) heart failure: Secondary | ICD-10-CM | POA: Insufficient documentation

## 2023-12-03 DIAGNOSIS — I447 Left bundle-branch block, unspecified: Secondary | ICD-10-CM | POA: Insufficient documentation

## 2023-12-03 LAB — ECHOCARDIOGRAM COMPLETE
Area-P 1/2: 2.87 cm2
S' Lateral: 3.58 cm

## 2023-12-04 ENCOUNTER — Encounter (HOSPITAL_COMMUNITY): Payer: Self-pay | Admitting: *Deleted

## 2023-12-06 ENCOUNTER — Ambulatory Visit (INDEPENDENT_AMBULATORY_CARE_PROVIDER_SITE_OTHER): Payer: Medicare Other

## 2023-12-06 DIAGNOSIS — I428 Other cardiomyopathies: Secondary | ICD-10-CM

## 2023-12-06 LAB — CUP PACEART REMOTE DEVICE CHECK
Battery Remaining Longevity: 65 mo
Battery Remaining Percentage: 72 %
Battery Voltage: 2.98 V
Brady Statistic AP VP Percent: 1.7 %
Brady Statistic AP VS Percent: 1 %
Brady Statistic AS VP Percent: 95 %
Brady Statistic AS VS Percent: 3.3 %
Brady Statistic RA Percent Paced: 1.8 %
Date Time Interrogation Session: 20250919020018
HighPow Impedance: 73 Ohm
Implantable Lead Connection Status: 753985
Implantable Lead Connection Status: 753985
Implantable Lead Connection Status: 753985
Implantable Lead Implant Date: 20151119
Implantable Lead Implant Date: 20151119
Implantable Lead Implant Date: 20151119
Implantable Lead Location: 753858
Implantable Lead Location: 753859
Implantable Lead Location: 753860
Implantable Pulse Generator Implant Date: 20230921
Lead Channel Impedance Value: 440 Ohm
Lead Channel Impedance Value: 460 Ohm
Lead Channel Impedance Value: 510 Ohm
Lead Channel Pacing Threshold Amplitude: 0.5 V
Lead Channel Pacing Threshold Amplitude: 1 V
Lead Channel Pacing Threshold Amplitude: 1 V
Lead Channel Pacing Threshold Pulse Width: 0.5 ms
Lead Channel Pacing Threshold Pulse Width: 0.5 ms
Lead Channel Pacing Threshold Pulse Width: 0.5 ms
Lead Channel Sensing Intrinsic Amplitude: 12 mV
Lead Channel Sensing Intrinsic Amplitude: 3.6 mV
Lead Channel Setting Pacing Amplitude: 1.5 V
Lead Channel Setting Pacing Amplitude: 2 V
Lead Channel Setting Pacing Amplitude: 2.5 V
Lead Channel Setting Pacing Pulse Width: 0.5 ms
Lead Channel Setting Pacing Pulse Width: 0.5 ms
Lead Channel Setting Sensing Sensitivity: 0.5 mV
Pulse Gen Serial Number: 211001533

## 2023-12-09 ENCOUNTER — Ambulatory Visit
Admission: RE | Admit: 2023-12-09 | Discharge: 2023-12-09 | Disposition: A | Discharge status: Home / Self Care | Source: Ambulatory Visit | Attending: Cardiology

## 2023-12-09 ENCOUNTER — Ambulatory Visit (INDEPENDENT_AMBULATORY_CARE_PROVIDER_SITE_OTHER)

## 2023-12-09 DIAGNOSIS — I5032 Chronic diastolic (congestive) heart failure: Secondary | ICD-10-CM | POA: Diagnosis present

## 2023-12-09 DIAGNOSIS — Z95810 Presence of automatic (implantable) cardiac defibrillator: Secondary | ICD-10-CM

## 2023-12-09 DIAGNOSIS — I5022 Chronic systolic (congestive) heart failure: Secondary | ICD-10-CM

## 2023-12-09 LAB — BASIC METABOLIC PANEL WITH GFR
Anion gap: 7 (ref 5–15)
BUN: 15 mg/dL (ref 8–23)
CO2: 28 mmol/L (ref 22–32)
Calcium: 8.7 mg/dL — ABNORMAL LOW (ref 8.9–10.3)
Chloride: 98 mmol/L (ref 98–111)
Creatinine, Ser: 1.18 mg/dL (ref 0.61–1.24)
GFR, Estimated: >60 mL/min (ref >60)
Glucose, Bld: 168 mg/dL — ABNORMAL HIGH (ref 70–99)
Potassium: 4.3 mmol/L (ref 3.5–5.1)
Sodium: 133 mmol/L — ABNORMAL LOW (ref 135–145)

## 2023-12-10 ENCOUNTER — Telehealth: Payer: Self-pay

## 2023-12-10 NOTE — Progress Notes (Signed)
 EPIC Encounter for ICM Monitoring  Patient Name: Walter [name] is a 78 y.o. male Date: 12/10/2023 Primary Care Physican: Kip Righter, MD Primary Cardiologist: Rolan Electrophysiologist: Cindie Pore Pacing: 96%         08/07/2022 Office Weight: 234 lbs 12/27/2022 Office Weight: 235 lbs 03/22/2023 Weight: 235 lbs 08/06/2023 Weight:  215 lbs 11/27/2023 Office Weight: 217 lbs   AT/AF Burden: <1%             Attempted call to patient and unable to reach.  Left detailed message per DPR regarding transmission.  Transmission results reviewed.    Diet:  Eating out once a week.  Is not strict with salt intake and eating foods high in salt such as cheese and crackers.      CorVue thoracic impedance suggesting possible fluid accumulation from 8/30-9/11 followed by possible dryness from 9/12-9/19 but back to baseline 9/20.   Prescribed:  Furosemide  20 mg Take 1 tablet (20 mg total) by mouth every other day.  08/06/2023 Pt takes as needed instead of every other day and Dr Rolan is aware.   Spironolactone  25 mg take 1 tablet by mouth daily    Labs: 01/27/2024 Scheduled BMET at HF clinic lab 12/09/2023 Creatinine 1.18, BUN 15, Potassium 4.3, Sodium 133, GFR >60  11/27/2023 Creatinine 1.10, BUN 18, Potassium 3.8, Sodium 136, GFR >60  06/10/2023 Creatinine 1.23, BUN 22, Potassium 4.5, Sodium 136, GFR 60 A complete set of results can be found in Results Review.   Recommendations:  Left voice mail with ICM number and encouraged to call if experiencing any fluid symptoms.   Follow-up plan: ICM clinic phone appointment on 01/13/2024.  91 day device clinic remote transmission 03/06/2024.     EP/Cardiology Office Visits:  Recall 06/12/2024 with Dr. Cindie.  05/26/2024 with HF clinic.   Copy of ICM check sent to Dr. Cindie.    3 month ICM trend: 12/09/2023.    12-14 Month ICM trend:     Walter GORMAN Garner, RN 12/10/2023 11:27 AM

## 2023-12-10 NOTE — Progress Notes (Signed)
Remote ICD Transmission.

## 2023-12-10 NOTE — Telephone Encounter (Signed)
 Remote ICM transmission received.  Attempted call to patient regarding ICM remote transmission.  Left detailed message per DPR with ICM phone number to return call for any questions, concerns or fluid symptoms.

## 2023-12-11 ENCOUNTER — Ambulatory Visit: Payer: Self-pay | Admitting: Cardiology

## 2024-01-13 ENCOUNTER — Ambulatory Visit: Attending: Cardiology

## 2024-01-13 DIAGNOSIS — I5022 Chronic systolic (congestive) heart failure: Secondary | ICD-10-CM | POA: Diagnosis not present

## 2024-01-13 DIAGNOSIS — Z95810 Presence of automatic (implantable) cardiac defibrillator: Secondary | ICD-10-CM | POA: Diagnosis not present

## 2024-01-15 NOTE — Progress Notes (Signed)
 EPIC Encounter for ICM Monitoring  Patient Name: Walter [name] is a 78 y.o. male Date: 01/15/2024 Primary Care Physican: Kip Righter, MD Primary Cardiologist: Rolan Electrophysiologist: Cindie Pore Pacing: 96%         08/07/2022 Office Weight: 234 lbs 12/27/2022 Office Weight: 235 lbs 03/22/2023 Weight: 235 lbs 08/06/2023 Weight:  215 lbs 11/27/2023 Office Weight: 217 lbs 01/15/2024 Weight: 207 lbs   AT/AF Burden: <1%             Spoke with patient and heart failure questions reviewed.  Transmission results reviewed.  Pt reports occasional swelling in feet/legs which is resolved by Lasix  which he takes PRN.  He is taking Mounjaro and losing weight.   Diet:  01/15/2024 Is paying more attention to diet and eating less salt.  Aware to limit fluid intake to 64 oz daily   Since 12/09/2023 ICM Remote Transmission: CorVue thoracic impedance suggesting normal fluid levels with the exception of possible fluid accumulation from 12/08/2023-12/20/2023.   Prescribed:  Furosemide  20 mg Take 1 tablet (20 mg total) by mouth every other day.  08/06/2023 Pt takes as needed instead of every other day and Dr Rolan is aware.   Spironolactone  25 mg take 1 tablet by mouth daily    Labs: 01/27/2024 Scheduled BMET at HF clinic lab 12/09/2023 Creatinine 1.18, BUN 15, Potassium 4.3, Sodium 133, GFR >60  11/27/2023 Creatinine 1.10, BUN 18, Potassium 3.8, Sodium 136, GFR >60  06/10/2023 Creatinine 1.23, BUN 22, Potassium 4.5, Sodium 136, GFR 60 A complete set of results can be found in Results Review.   Recommendations:  No changes and encouraged to call if experiencing any fluid symptoms.   Follow-up plan: ICM clinic phone appointment on 02/17/2024.  91 day device clinic remote transmission 03/06/2024.     EP/Cardiology Office Visits:  Recall 06/12/2024 with Dr. Cindie.  05/26/2024 with HF clinic.   Copy of ICM check sent to Dr. Cindie.      Remote monitoring is medically necessary for Heart Failure  Management.    Daily Thoracic Impedance ICM trend: 10/15/2023 through 01/13/2024.    12-14 Month Thoracic Impedance ICM trend:     Mitzie GORMAN Garner, RN 01/15/2024 10:15 AM

## 2024-01-24 ENCOUNTER — Ambulatory Visit (HOSPITAL_COMMUNITY)
Admission: RE | Admit: 2024-01-24 | Discharge: 2024-01-24 | Disposition: A | Discharge status: Home / Self Care | Source: Ambulatory Visit | Attending: Cardiology | Admitting: Cardiology

## 2024-01-24 DIAGNOSIS — I5032 Chronic diastolic (congestive) heart failure: Secondary | ICD-10-CM | POA: Diagnosis present

## 2024-01-24 LAB — HEPATIC FUNCTION PANEL
ALT: 17 U/L (ref 0–44)
AST: 20 U/L (ref 15–41)
Albumin: 3.8 g/dL (ref 3.5–5.0)
Alkaline Phosphatase: 52 U/L (ref 38–126)
Bilirubin, Direct: 0.2 mg/dL (ref 0.0–0.2)
Indirect Bilirubin: 0.7 mg/dL (ref 0.3–0.9)
Total Bilirubin: 0.9 mg/dL (ref 0.0–1.2)
Total Protein: 6.2 g/dL — ABNORMAL LOW (ref 6.5–8.1)

## 2024-01-24 LAB — LIPID PANEL
Cholesterol: 118 mg/dL (ref 0–200)
HDL: 38 mg/dL — ABNORMAL LOW (ref >40)
LDL Cholesterol: 57 mg/dL (ref 0–99)
Total CHOL/HDL Ratio: 3.1 ratio
Triglycerides: 117 mg/dL (ref <150)
VLDL: 23 mg/dL (ref 0–40)

## 2024-01-27 ENCOUNTER — Other Ambulatory Visit (HOSPITAL_COMMUNITY)

## 2024-01-31 ENCOUNTER — Other Ambulatory Visit: Payer: Self-pay | Admitting: *Deleted

## 2024-01-31 MED ORDER — PRIMIDONE 50 MG PO TABS: 25.0000 mg | ORAL_TABLET | Freq: Two times a day (BID) | ORAL | 1 refills | Status: AC

## 2024-02-17 ENCOUNTER — Telehealth: Payer: Self-pay

## 2024-02-17 ENCOUNTER — Ambulatory Visit: Attending: Cardiology

## 2024-02-17 DIAGNOSIS — I5032 Chronic diastolic (congestive) heart failure: Secondary | ICD-10-CM

## 2024-02-17 DIAGNOSIS — Z95810 Presence of automatic (implantable) cardiac defibrillator: Secondary | ICD-10-CM | POA: Diagnosis not present

## 2024-02-17 NOTE — Progress Notes (Signed)
 EPIC Encounter for ICM Monitoring  Patient Name: Walter [name] is a 78 y.o. male Date: 02/17/2024 Primary Care Physican: Kip Righter, MD Primary Cardiologist: Rolan Electrophysiologist: Inocencio Bi-V Pacing: 95%         08/07/2022 Office Weight: 234 lbs 12/27/2022 Office Weight: 235 lbs 03/22/2023 Weight: 235 lbs 08/06/2023 Weight:  215 lbs 11/27/2023 Office Weight: 217 lbs 01/15/2024 Weight: 207 lbs   AT/AF Burden: <1%             Attempted call to patient and unable to reach.   Transmission results reviewed.    Diet:  01/15/2024 Is paying more attention to diet and eating less salt.  Aware to limit fluid intake to 64 oz daily   Since 01/13/2024 ICM Remote Transmission: CorVue thoracic impedance suggesting possible fluid accumulation from 01/21/2024-02/03/2024 and 02/10/2024-02/12/2024 and possible dryness starting 02/13/2024 and returning to baseline.   Prescribed:  Furosemide  20 mg Take 1 tablet (20 mg total) by mouth every other day.  08/06/2023 Pt takes as needed instead of every other day and Dr Rolan is aware.   Spironolactone  25 mg take 1 tablet by mouth daily    Labs: 12/09/2023 Creatinine 1.18, BUN 15, Potassium 4.3, Sodium 133, GFR >60  11/27/2023 Creatinine 1.10, BUN 18, Potassium 3.8, Sodium 136, GFR >60  06/10/2023 Creatinine 1.23, BUN 22, Potassium 4.5, Sodium 136, GFR 60 A complete set of results can be found in Results Review.   Recommendations:  Unable to reach.     Follow-up plan: ICM clinic phone appointment on 03/30/2024.  91 day device clinic remote transmission 03/06/2024.     EP/Cardiology Office Visits:  Recall 06/12/2024 with Dr. Cindie.  05/26/2024 with HF clinic.   Copy of ICM check sent to Dr. Inocencio.      Remote monitoring is medically necessary for Heart Failure Management.    Daily Thoracic Impedance ICM trend: 11/19/2023 through 02/17/2024.    12-14 Month Thoracic Impedance ICM trend:     Mitzie GORMAN Garner, RN 02/17/2024 4:43 PM

## 2024-02-17 NOTE — Telephone Encounter (Signed)
 Remote ICM transmission received.  Attempted call to patient regarding ICM remote transmission and no answer.

## 2024-03-06 ENCOUNTER — Ambulatory Visit: Payer: Medicare Other

## 2024-03-06 DIAGNOSIS — I428 Other cardiomyopathies: Secondary | ICD-10-CM | POA: Diagnosis not present

## 2024-03-07 LAB — CUP PACEART REMOTE DEVICE CHECK
Battery Remaining Longevity: 61 mo
Battery Remaining Percentage: 68 %
Battery Voltage: 2.98 V
Brady Statistic AP VP Percent: 1.7 %
Brady Statistic AP VS Percent: 1 %
Brady Statistic AS VP Percent: 93 %
Brady Statistic AS VS Percent: 4.4 %
Brady Statistic RA Percent Paced: 1.9 %
Date Time Interrogation Session: 20251219030012
HighPow Impedance: 69 Ohm
Implantable Lead Connection Status: 753985
Implantable Lead Connection Status: 753985
Implantable Lead Connection Status: 753985
Implantable Lead Implant Date: 20151119
Implantable Lead Implant Date: 20151119
Implantable Lead Implant Date: 20151119
Implantable Lead Location: 753858
Implantable Lead Location: 753859
Implantable Lead Location: 753860
Implantable Pulse Generator Implant Date: 20230921
Lead Channel Impedance Value: 380 Ohm
Lead Channel Impedance Value: 440 Ohm
Lead Channel Impedance Value: 480 Ohm
Lead Channel Pacing Threshold Amplitude: 0.5 V
Lead Channel Pacing Threshold Amplitude: 1 V
Lead Channel Pacing Threshold Amplitude: 1 V
Lead Channel Pacing Threshold Pulse Width: 0.5 ms
Lead Channel Pacing Threshold Pulse Width: 0.5 ms
Lead Channel Pacing Threshold Pulse Width: 0.5 ms
Lead Channel Sensing Intrinsic Amplitude: 12 mV
Lead Channel Sensing Intrinsic Amplitude: 2.3 mV
Lead Channel Setting Pacing Amplitude: 1.5 V
Lead Channel Setting Pacing Amplitude: 2 V
Lead Channel Setting Pacing Amplitude: 2.5 V
Lead Channel Setting Pacing Pulse Width: 0.5 ms
Lead Channel Setting Pacing Pulse Width: 0.5 ms
Lead Channel Setting Sensing Sensitivity: 0.5 mV
Pulse Gen Serial Number: 211001533

## 2024-03-09 NOTE — Progress Notes (Signed)
 Remote ICD Transmission

## 2024-03-13 ENCOUNTER — Ambulatory Visit: Payer: Self-pay | Admitting: Cardiology

## 2024-03-30 ENCOUNTER — Ambulatory Visit: Attending: Cardiology

## 2024-03-30 DIAGNOSIS — I5032 Chronic diastolic (congestive) heart failure: Secondary | ICD-10-CM

## 2024-03-30 DIAGNOSIS — Z95810 Presence of automatic (implantable) cardiac defibrillator: Secondary | ICD-10-CM | POA: Diagnosis not present

## 2024-03-31 NOTE — Progress Notes (Signed)
 EPIC Encounter for ICM Monitoring  Patient Name: Walter [name] is a 79 y.o. male Date: 03/31/2024 Primary Care Physican: Kip Righter, MD Primary Cardiologist: Rolan Electrophysiologist: Inocencio Bi-V Pacing: 95%         08/07/2022 Office Weight: 234 lbs 12/27/2022 Office Weight: 235 lbs 03/22/2023 Weight: 235 lbs 08/06/2023 Weight:  215 lbs 11/27/2023 Office Weight: 217 lbs 01/15/2024 Weight: 207 lbs   AT/AF Burden: <1%             Transmission results reviewed.    Diet:  01/15/2024 Is paying more attention to diet and eating less salt.  Aware to limit fluid intake to 64 oz daily   Since 02/17/2024 ICM Remote Transmission: CorVue thoracic impedance suggesting normal fluid levels with the exception of possible fluid accumulation from 02/25/2024-03/05/2024.   Prescribed:  Furosemide  20 mg Take 1 tablet (20 mg total) by mouth every other day.  08/06/2023 Pt takes as needed instead of every other day and Dr Rolan is aware.   Spironolactone  25 mg take 1 tablet by mouth daily    Labs: 12/09/2023 Creatinine 1.18, BUN 15, Potassium 4.3, Sodium 133, GFR >60  11/27/2023 Creatinine 1.10, BUN 18, Potassium 3.8, Sodium 136, GFR >60  06/10/2023 Creatinine 1.23, BUN 22, Potassium 4.5, Sodium 136, GFR 60 A complete set of results can be found in Results Review.   Recommendations:  No changes.    Follow-up plan: ICM clinic phone appointment on 04/30/2024.  91 day device clinic remote transmission 06/05/2024.     EP/Cardiology Office Visits:  Recall 06/12/2024 with Dr. Cindie.  05/26/2024 with HF clinic.   Copy of ICM check sent to Dr. Inocencio.        Remote monitoring is medically necessary for Heart Failure Management.    Daily Thoracic Impedance ICM trend: 01/03/2024 through 03/27/2024.      Mitzie GORMAN Garner, RN 03/31/2024 2:13 PM

## 2024-04-06 ENCOUNTER — Telehealth: Payer: Self-pay | Admitting: *Deleted

## 2024-04-06 NOTE — Telephone Encounter (Signed)
 Spoke w/ patient regarding recent device for downward BiV pacing trend and possible fluid accumulation. Current BP 94%. Patient states he is asymptomatic and feels great. Does not note any fluid accumulation. States he notices it more in his feet when he is not active. Patient states he does not take his Lasix  all the time, but only when he needs to. Currently prescribed Lasix  20mg  once every other day. Informed patient of possible fluid accumulation according to recent transmission. Advised to take Lasix  as prescribed and to see if this helps. Patient verbalized understanding and states he will be more aware and take Lasix  as needed. States he will continue to try to be more active as well.   Will continue to monitor and update accordingly.

## 2024-04-06 NOTE — Telephone Encounter (Signed)
 Pt left VM returning call

## 2024-04-06 NOTE — Telephone Encounter (Signed)
 Alert received from [employer]:  Alert remote transmission: BiV pacing < threshold Presenting rhythm AS/BiV pace with PVC's, downward BiV pacing trend - route to triage Follow up as scheduled. LA, CVRS ________________________________________________________________________________  BiV threshold alert set to go off if BiV pacing <90%  Quick look BP reading currently states 94%  BP pacing graph appears to be trending down   PVC burden <0.1%  CORVUE graph also appears to indicate possible fluid accumulation   Attempted outreach to patient to assess for potential symptoms  No answer. LMTCB  Routing to HF coordinator for awareness

## 2024-04-07 NOTE — Telephone Encounter (Signed)
 Attempted ICM call to request manual transmission be sent on 04/09/2024 to recheck fluid levels but no answer.

## 2024-04-30 ENCOUNTER — Ambulatory Visit

## 2024-05-11 ENCOUNTER — Ambulatory Visit: Admitting: Diagnostic Neuroimaging

## 2024-05-26 ENCOUNTER — Encounter (HOSPITAL_COMMUNITY)
